# Patient Record
Sex: Male | Born: 1945 | Race: White | Hispanic: No | Marital: Married | Smoking: Former smoker
Health system: Southern US, Community
[De-identification: ages and names within clinical notes are randomized; demographics above are authoritative.]

## PROBLEM LIST (undated history)

## (undated) DIAGNOSIS — M1990 Unspecified osteoarthritis, unspecified site: Secondary | ICD-10-CM

## (undated) DIAGNOSIS — J4489 Other specified chronic obstructive pulmonary disease: Secondary | ICD-10-CM

## (undated) DIAGNOSIS — I499 Cardiac arrhythmia, unspecified: Secondary | ICD-10-CM

## (undated) DIAGNOSIS — E785 Hyperlipidemia, unspecified: Secondary | ICD-10-CM

## (undated) DIAGNOSIS — G4733 Obstructive sleep apnea (adult) (pediatric): Secondary | ICD-10-CM

## (undated) DIAGNOSIS — J310 Chronic rhinitis: Secondary | ICD-10-CM

## (undated) DIAGNOSIS — I2510 Atherosclerotic heart disease of native coronary artery without angina pectoris: Secondary | ICD-10-CM

## (undated) DIAGNOSIS — J449 Chronic obstructive pulmonary disease, unspecified: Secondary | ICD-10-CM

## (undated) DIAGNOSIS — H269 Unspecified cataract: Secondary | ICD-10-CM

## (undated) DIAGNOSIS — I4891 Unspecified atrial fibrillation: Secondary | ICD-10-CM

## (undated) DIAGNOSIS — J479 Bronchiectasis, uncomplicated: Secondary | ICD-10-CM

## (undated) DIAGNOSIS — G4730 Sleep apnea, unspecified: Secondary | ICD-10-CM

## (undated) DIAGNOSIS — N289 Disorder of kidney and ureter, unspecified: Secondary | ICD-10-CM

## (undated) DIAGNOSIS — K8050 Calculus of bile duct without cholangitis or cholecystitis without obstruction: Secondary | ICD-10-CM

## (undated) DIAGNOSIS — K862 Cyst of pancreas: Secondary | ICD-10-CM

## (undated) DIAGNOSIS — I10 Essential (primary) hypertension: Secondary | ICD-10-CM

## (undated) HISTORY — DX: Unspecified atrial fibrillation: I48.91

## (undated) HISTORY — DX: Obstructive sleep apnea (adult) (pediatric): G47.33

## (undated) HISTORY — DX: Cardiac arrhythmia, unspecified: I49.9

## (undated) HISTORY — DX: Unspecified cataract: H26.9

## (undated) HISTORY — DX: Sleep apnea, unspecified: G47.30

## (undated) HISTORY — DX: Chronic rhinitis: J31.0

## (undated) HISTORY — DX: Essential (primary) hypertension: I10

## (undated) HISTORY — DX: Atherosclerotic heart disease of native coronary artery without angina pectoris: I25.10

## (undated) HISTORY — DX: Other specified chronic obstructive pulmonary disease: J44.89

## (undated) HISTORY — DX: Unspecified osteoarthritis, unspecified site: M19.90

## (undated) HISTORY — DX: Chronic obstructive pulmonary disease, unspecified: J44.9

## (undated) HISTORY — DX: Calculus of bile duct without cholangitis or cholecystitis without obstruction: K80.50

## (undated) HISTORY — DX: Hyperlipidemia, unspecified: E78.5

## (undated) HISTORY — DX: Cyst of pancreas: K86.2

---

## 1998-12-19 HISTORY — PX: LITHOTRIPSY: SUR834

## 2004-03-19 LAB — HM COLONOSCOPY

## 2004-03-19 LAB — HM DEXA SCAN

## 2004-04-22 ENCOUNTER — Encounter: Admission: RE | Admit: 2004-04-22 | Discharge: 2004-04-22 | Payer: Self-pay | Admitting: *Deleted

## 2007-04-02 ENCOUNTER — Ambulatory Visit: Payer: Self-pay | Admitting: Gastroenterology

## 2007-04-16 ENCOUNTER — Ambulatory Visit: Payer: Self-pay | Admitting: Gastroenterology

## 2008-11-03 ENCOUNTER — Emergency Department (HOSPITAL_COMMUNITY): Admission: EM | Admit: 2008-11-03 | Discharge: 2008-11-03 | Payer: Self-pay | Admitting: Emergency Medicine

## 2009-12-16 ENCOUNTER — Encounter: Payer: Self-pay | Admitting: Family Medicine

## 2009-12-24 ENCOUNTER — Ambulatory Visit: Payer: Self-pay | Admitting: Sports Medicine

## 2009-12-24 DIAGNOSIS — I10 Essential (primary) hypertension: Secondary | ICD-10-CM | POA: Insufficient documentation

## 2009-12-24 DIAGNOSIS — IMO0002 Reserved for concepts with insufficient information to code with codable children: Secondary | ICD-10-CM | POA: Insufficient documentation

## 2009-12-24 DIAGNOSIS — J45909 Unspecified asthma, uncomplicated: Secondary | ICD-10-CM | POA: Insufficient documentation

## 2009-12-24 DIAGNOSIS — M25569 Pain in unspecified knee: Secondary | ICD-10-CM | POA: Insufficient documentation

## 2009-12-24 DIAGNOSIS — E785 Hyperlipidemia, unspecified: Secondary | ICD-10-CM | POA: Insufficient documentation

## 2009-12-25 ENCOUNTER — Encounter: Payer: Self-pay | Admitting: Sports Medicine

## 2010-01-28 ENCOUNTER — Ambulatory Visit: Payer: Self-pay | Admitting: Sports Medicine

## 2011-01-20 NOTE — Assessment & Plan Note (Signed)
Summary: NP,LEFT KNEE,MC   Vital Signs:  Patient profile:   65 year old male Height:      69 inches Weight:      249 pounds BMI:     36.90 BP sitting:   114 / 79  Vitals Entered By: Lillia Pauls CMA (December 24, 2009 2:33 PM)  History of Present Illness: Pt presents today for evaluation of his Left Knee.  He states that 6 weeks ago he was working in his garage all day and by the end of the day he felt the medial aspect of his knee began to bother him.  Since that time he has had increased pain and difficulty with movement of his knee. He states that it is difficult for his to straighten or to flex his knee.  He describes the pain as a knawing ache that is like a tooth ache.  No shoot pain, no numbness or tingling, no griding, no poping, no locking and no giving out on him.  He did try a OTC knee brace that he felt made it worse and may have bruised him so he stopped using it.  Also for pain he has been taking ibprofen with moderate relief.  He has had 2 other major injuries to this leg in the past including a childhood football injury involving a lateral blow to the knee and a spiral fracture to his ankle from a ski accident.    This patient referred courtesy of Dr Dara Hoyer at [facility]  Allergies (verified): No Known Drug Allergies  Past History:  Past Medical History: Asthma Hyperlipidemia Hypertension Urolithiasis  Past Surgical History: Ltihotripsy  Physical Exam  General:  Well-developed,well-nourished, overweight-appearing,in no acute distress; alert,appropriate and cooperative throughout examination .   Head:  Normocephalic and atraumatic without obvious abnormalities.  Msk:  Left Knee Exam: ROM: Flexion = 90o; extension =+15o Ligaments intact + McMurrary's & + Thessaly  Additional Exam:  Knee [place]: Medial meniscus with hypoecoic areas indicating a tear in the superficial area with fluid dissection along femur; Quadriceps tendons intact; No bursal fluid noted; several  images show peripheral fragmentation of meniscus which is consistent with his exam. Other areas show some calcification of mensicus without fragmentation consistent with chronic degnerative process  images saved    Impression & Recommendations:  Problem # 1:  KNEE PAIN, LEFT (ICD-719.46)  His updated medication list for this problem includes:    Aspirin 325 Mg Tabs (Aspirin)  he feels he can tolerate this with OTC NSAIDS we will fit him with a Irena Cords support for standing and walking He will prob need to use this at least 8 weeks  Orders: US EXTREMITY NON-VASC REAL-TIME IMG (21308) Knee Support Pat cutout [phone])  Problem # 2:  MEDIAL MENISCUS TEAR, LEFT (ICD-836.0)  with clinical exam and US findings correlating so closely this is about 90% diagnostic I explained that degenerative meniscus injury is common in older age group and that my review of his XRAY and other findings was relatively unremarkable I do not think he has any sever DJD  encourage long term quad strength recumbent bike SLR quad sets reck in 1 month  Orders: US EXTREMITY NON-VASC REAL-TIME IMG (69629) Knee Support Pat cutout [phone])  Complete Medication List: 1)  Advair Diskus 100-50 Mcg/dose Aepb (Fluticasone-salmeterol) 2)  Diovan Hct 80-12.5 Mg Tabs (Valsartan-hydrochlorothiazide) 3)  Lovastatin 10 Mg Tabs (Lovastatin) 4)  Aspirin 325 Mg Tabs (Aspirin) 5)  Multivitamins Caps (Multiple vitamin)  Patient Instructions: 1)  It  was nice seeing you today. 2)  Wear the knee sleeve we provided you today for during exercises. 3)  Use a exercise bike making sure the seat height is adequately adjusted. 4)  Complete the Quad Set & Straight-Leg lift exercises as shown in the handout 5)  Use ibprofen or aleve for pain when needed

## 2011-01-20 NOTE — Consult Note (Signed)
Summary: [facility]  [facility]   Imported By: Marily Memos 01/04/2010 16:27:40  _____________________________________________________________________  External Attachment:    Type:   Image     Comment:   External Document

## 2011-01-20 NOTE — Assessment & Plan Note (Signed)
Summary: F/U,MC   Vital Signs:  Patient profile:   65 year old male BP sitting:   121 / 82  Vitals Entered By: Lillia Pauls CMA (January 28, 2010 2:02 PM)  History of Present Illness: Pt presents for follow-up of left knee pain that started in November of 2010 while he was cleaning out his garage. He was diagnosed with a medial meniscus tear on ultrasound at his last visit and given a DonJoy knee brace and multiple exercises to do. He is still not requiring any pain medications. He has not been doing his exercises consistently but is hoping to get a stationary bicycle in the next week to help with knee motion. He denies locking but has minimal edema on occasion. He does limp when he has the intermittent shooting pain. He prefers to wear the brace because it decreases his sensation of instability. He has no desire for surgery at this time.   Allergies: No Known Drug Allergies  Physical Exam  General:  alert and well-developed.   Head:  normocephalic and atraumatic.   Neck:  supple.   Lungs:  normal respiratory effort.   Msk:  Left Knee: No bony abnormalities, edema or bruising Lacks terminal extension (5 degrees) and can only flex to 95 degrees + TTP along the medial joint line Normal MCL and LCL + McMurray's testing Neg ADS and PDS 5/5 strength on resisted knee flexion and extension Able to walk without a limp  Right Knee: No bony abnormalities, edema or bruising Full ROM without pain No TTP throughout Normal MCL and LCL Neg ADS and PDS and McMurray's 5/5 strength with resisted knee flexion and extension Able to walk without a limp    Impression & Recommendations:  Problem # 1:  MEDIAL MENISCUS TEAR, LEFT (ICD-836.0) Assessment Improved About 15% better per the patient 1. Really encouraged him to get on a stationary bike to help with his over all fitness and mobility of his left knee 2. Would wait on an injection until pain/aching at night without improvement with  OTC pain meds 3. Continue strengthening exercises daily 4. Ice as needed for swelling for 20 minutes daily 5. Return in 2 months for follow-up  Problem # 2:  KNEE PAIN, LEFT (ICD-719.46) Assessment: Improved See above for treatment plan  His updated medication list for this problem includes:    Aspirin 325 Mg Tabs (Aspirin)  Complete Medication List: 1)  Advair Diskus 100-50 Mcg/dose Aepb (Fluticasone-salmeterol) 2)  Diovan Hct 80-12.5 Mg Tabs (Valsartan-hydrochlorothiazide) 3)  Lovastatin 10 Mg Tabs (Lovastatin) 4)  Aspirin 325 Mg Tabs (Aspirin) 5)  Multivitamins Caps (Multiple vitamin)

## 2011-01-20 NOTE — Letter (Signed)
Summary: *Consult Note  [facility]  [address]   [place], [place] [address]   Phone: [phone]  Fax: [phone]    Re:    Mario [name]       Jan. 7, 2011 DOB:    Mar 31, 1946 Dr. Dara Hoyer [facility] [place]  Fax [phone]   Dear Onalee Hua:    Thank you for requesting that we see the above patient for consultation.  A copy of the detailed office note will be sent under separate cover, for your review.  Evaluation today is consistent with:  1)  MEDIAL MENISCUS TEAR, LEFT (ICD-836.0) 2)  KNEE PAIN, LEFT (ICD-719.46) 3)  HYPERTENSION (ICD-401.9) 4)  HYPERLIPIDEMIA (ICD-272.4) 5)  ASTHMA (ICD-493.90)  Our recommendation is for: conservative care.  He has some limits in full function that hopefully will resolve with rehab exercises and some supportive bracing.  I think he can probably resove this without surgery and that is his desire as well.  New Orders include:  1)  Consultation Level II [99242] 2)  US EXTREMITY NON-VASC REAL-TIME IMG [02725] 3)  Knee Support Pat cutout [L1825]   New Medications started today include: none; will use OTC NSAIDs   After today's visit, the patients current medications include: 1)  ADVAIR DISKUS 100-50 MCG/DOSE AEPB (FLUTICASONE-SALMETEROL)  2)  DIOVAN HCT 80-12.5 MG TABS (VALSARTAN-HYDROCHLOROTHIAZIDE)  3)  LOVASTATIN 10 MG TABS (LOVASTATIN)  4)  ASPIRIN 325 MG TABS (ASPIRIN)  5)  MULTIVITAMINS  CAPS (MULTIPLE VITAMIN)    Thank you for this consultation.  If you have any further questions regarding the care of this patient, please do not hesitate to contact me @ [phone].  Thank you for this opportunity to look after your patient.  Sincerely,  Vincent Gros MD

## 2011-02-19 ENCOUNTER — Encounter: Payer: Self-pay | Admitting: *Deleted

## 2011-06-10 ENCOUNTER — Encounter: Payer: Self-pay | Admitting: Family Medicine

## 2011-06-10 DIAGNOSIS — J310 Chronic rhinitis: Secondary | ICD-10-CM | POA: Insufficient documentation

## 2011-06-10 DIAGNOSIS — G4733 Obstructive sleep apnea (adult) (pediatric): Secondary | ICD-10-CM | POA: Insufficient documentation

## 2012-03-14 ENCOUNTER — Encounter: Payer: Self-pay | Admitting: Gastroenterology

## 2012-12-11 ENCOUNTER — Observation Stay (HOSPITAL_COMMUNITY)
Admission: EM | Admit: 2012-12-11 | Discharge: 2012-12-12 | Disposition: A | Discharge status: Home / Self Care | Payer: Medicare Other | Attending: Internal Medicine | Admitting: Internal Medicine

## 2012-12-11 ENCOUNTER — Encounter (HOSPITAL_COMMUNITY): Payer: Self-pay | Admitting: *Deleted

## 2012-12-11 ENCOUNTER — Emergency Department (HOSPITAL_COMMUNITY): Payer: Medicare Other

## 2012-12-11 DIAGNOSIS — Z7982 Long term (current) use of aspirin: Secondary | ICD-10-CM | POA: Insufficient documentation

## 2012-12-11 DIAGNOSIS — I10 Essential (primary) hypertension: Secondary | ICD-10-CM | POA: Diagnosis present

## 2012-12-11 DIAGNOSIS — Z79899 Other long term (current) drug therapy: Secondary | ICD-10-CM | POA: Insufficient documentation

## 2012-12-11 DIAGNOSIS — J45909 Unspecified asthma, uncomplicated: Secondary | ICD-10-CM | POA: Insufficient documentation

## 2012-12-11 DIAGNOSIS — K8020 Calculus of gallbladder without cholecystitis without obstruction: Secondary | ICD-10-CM | POA: Insufficient documentation

## 2012-12-11 DIAGNOSIS — R059 Cough, unspecified: Secondary | ICD-10-CM | POA: Insufficient documentation

## 2012-12-11 DIAGNOSIS — R1013 Epigastric pain: Secondary | ICD-10-CM | POA: Diagnosis present

## 2012-12-11 DIAGNOSIS — E785 Hyperlipidemia, unspecified: Secondary | ICD-10-CM | POA: Diagnosis present

## 2012-12-11 DIAGNOSIS — R079 Chest pain, unspecified: Principal | ICD-10-CM | POA: Insufficient documentation

## 2012-12-11 DIAGNOSIS — R05 Cough: Secondary | ICD-10-CM | POA: Insufficient documentation

## 2012-12-11 DIAGNOSIS — G4733 Obstructive sleep apnea (adult) (pediatric): Secondary | ICD-10-CM | POA: Diagnosis present

## 2012-12-11 LAB — TROPONIN I: Troponin I: <0.3 ng/mL (ref <0.30)

## 2012-12-11 LAB — CBC WITH DIFFERENTIAL/PLATELET
Basophils Relative: 0 % (ref 0–1)
HCT: 43.4 % (ref 39.0–52.0)
Hemoglobin: 14.8 g/dL (ref 13.0–17.0)
Lymphocytes Relative: 15 % (ref 12–46)
Lymphs Abs: 1.6 10*3/uL (ref 0.7–4.0)
MCV: 88.9 fL (ref 78.0–100.0)
Monocytes Absolute: 0.6 10*3/uL (ref 0.1–1.0)
Neutrophils Relative %: 77 % (ref 43–77)
Platelets: 250 10*3/uL (ref 150–400)
WBC: 10.4 10*3/uL (ref 4.0–10.5)

## 2012-12-11 LAB — COMPREHENSIVE METABOLIC PANEL
ALT: 27 U/L (ref 0–53)
Albumin: 3.5 g/dL (ref 3.5–5.2)
BUN: 23 mg/dL (ref 6–23)
CO2: 30 mEq/L (ref 19–32)
Calcium: 9.2 mg/dL (ref 8.4–10.5)
Creatinine, Ser: 1.36 mg/dL — ABNORMAL HIGH (ref 0.50–1.35)
GFR calc Af Amer: 61 mL/min — ABNORMAL LOW (ref >90)
Glucose, Bld: 121 mg/dL — ABNORMAL HIGH (ref 70–99)
Sodium: 135 mEq/L (ref 135–145)
Total Bilirubin: 0.5 mg/dL (ref 0.3–1.2)

## 2012-12-11 MED ORDER — NITROGLYCERIN 0.4 MG SL SUBL
0.4000 mg | SUBLINGUAL_TABLET | SUBLINGUAL | Status: DC | PRN
  Administered 2012-12-11 (×2): 0.4 mg via SUBLINGUAL
  Filled 2012-12-11: qty 25

## 2012-12-11 MED ORDER — GI COCKTAIL ~~LOC~~
30.0000 mL | Freq: Once | ORAL | Status: AC
  Administered 2012-12-11: 30 mL via ORAL
  Filled 2012-12-11: qty 30

## 2012-12-11 MED ORDER — ONDANSETRON HCL 4 MG/2ML IJ SOLN
4.0000 mg | Freq: Once | INTRAMUSCULAR | Status: AC
  Administered 2012-12-11: 4 mg via INTRAVENOUS
  Filled 2012-12-11: qty 2

## 2012-12-11 MED ORDER — MORPHINE SULFATE 4 MG/ML IJ SOLN
4.0000 mg | Freq: Once | INTRAMUSCULAR | Status: AC
  Administered 2012-12-11: 4 mg via INTRAVENOUS
  Filled 2012-12-11: qty 1

## 2012-12-11 NOTE — ED Notes (Addendum)
Wife : (emergency contact) [phone] Daughter:  [phone] Please contact wife first and then contact daughter with any change of condition  Password:  [email or web address]

## 2012-12-11 NOTE — ED Notes (Signed)
Pt denies nausea or vomiting,  Chest pressure across under breast started 3 hours ago  Sudden onset,  Denies nausea or vomiting.,  States was watching tv

## 2012-12-11 NOTE — ED Provider Notes (Signed)
History     CSN: [id number]  Arrival date & time 12/11/12  2147   First MD Initiated Contact with Patient 12/11/12 2201      Chief Complaint  Patient presents with  . Chest Pain    (Consider location/radiation/quality/duration/timing/severity/associated sxs/prior treatment) Patient is a 66 y.o. male presenting with chest pain. The history is provided by the patient.  Chest Pain   He had onset at about 6 PM of a dull, achy pain across his upper abdomen without radiation. Pain is moderately severe and he rates at 8/10. He did vomit twice which did not affect the pain. There is no dyspnea or diaphoresis. Pain does vary with position, but he has not found a position that is consistently more comfortable her consistently less comfortable. He's never had similar pain in the past. He does have significant cardiac risk factors hypertension and hyperlipidemia. He is a nonsmoker and denies history of diabetes. He did take 5 baby aspirin home prior to coming in.  Past Medical History  Diagnosis Date  . Asthma   . Hypertension   . Obstructive sleep apnea   . Rhinitis   . Hyperlipidemia     History reviewed. No pertinent past surgical history.  History reviewed. No pertinent family history.  History  Substance Use Topics  . Smoking status: Never Smoker   . Smokeless tobacco: Not on file  . Alcohol Use: Yes      Review of Systems  Cardiovascular: Positive for chest pain.  All other systems reviewed and are negative.    Allergies  Review of patient's allergies indicates no known allergies.  Home Medications   Current Outpatient Rx  Name  Route  Sig  Dispense  Refill  . ASPIRIN 325 MG PO TABS                . ATORVASTATIN CALCIUM 40 MG PO TABS   Oral   Take 40 mg by mouth daily.           Marland Kitchen DICLOFENAC SODIUM 1 % TD GEL   Topical   Apply topically 4 (four) times daily. Rub 1 gram to the affected area          . OMEGA-3 FATTY ACIDS 1000 MG PO CAPS   Oral    Take 2 g by mouth daily.           Marland Kitchen FLAXSEED OIL OIL   Does not apply   by Does not apply route.           Marland Kitchen FLUTICASONE-SALMETEROL 100-50 MCG/DOSE IN AEPB                . LOSARTAN POTASSIUM-HCTZ 100-12.5 MG PO TABS   Oral   Take 1 tablet by mouth daily.           Marland Kitchen LOVASTATIN 10 MG PO TABS                . MONTELUKAST SODIUM 10 MG PO TABS   Oral   Take 10 mg by mouth at bedtime.           . MULTIVITAMINS PO CAPS                . SILDENAFIL CITRATE 100 MG PO TABS   Oral   Take 100 mg by mouth daily as needed.           . TESTOSTERONE 25 MG/2.5GM TD GEL   Transdermal   Place onto the skin.           Marland Kitchen  VALSARTAN-HYDROCHLOROTHIAZIDE 80-12.5 MG PO TABS                  BP 161/75  Pulse 61  Temp 98.7 F (37.1 C) (Oral)  Resp 20  Ht 5\' 9"  (1.753 m)  Wt 245 lb (111.131 kg)  BMI 36.18 kg/m2  SpO2 98%  Physical Exam  Nursing note and vitals reviewed. 66 year old male, resting comfortably and in no acute distress. Vital signs are significant for hypertension with blood pressure 161/75. Oxygen saturation is 98%, which is normal. Head is normocephalic and atraumatic. PERRLA, EOMI. Oropharynx is clear. Neck is nontender and supple without adenopathy or JVD. Back is nontender and there is no CVA tenderness. Lungs are clear without rales, wheezes, or rhonchi. Chest is nontender. Heart has regular rate and rhythm without murmur. Abdomen is soft, flat, with mild to moderate tenderness across the upper abdomen. There is a plus minus Murphy sign. There are no masses or hepatosplenomegaly and peristalsis is hypoactive. Extremities have no cyanosis or edema, full range of motion is present. Skin is warm and dry without rash. Neurologic: Mental status is normal, cranial nerves are intact, there are no motor or sensory deficits.   ED Course  Procedures (including critical care time)  Results for orders placed during the hospital encounter of 12/11/12   CBC WITH DIFFERENTIAL      Component Value Range   WBC 10.4  4.0 - 10.5 K/uL   RBC 4.88  4.22 - 5.81 MIL/uL   Hemoglobin 14.8  13.0 - 17.0 g/dL   HCT 16.1  09.6 - 04.5 %   MCV 88.9  78.0 - 100.0 fL   MCH 30.3  26.0 - 34.0 pg   MCHC 34.1  30.0 - 36.0 g/dL   RDW 40.9  81.1 - 91.4 %   Platelets 250  150 - 400 K/uL   Neutrophils Relative 77  43 - 77 %   Neutro Abs 8.0 (*) 1.7 - 7.7 K/uL   Lymphocytes Relative 15  12 - 46 %   Lymphs Abs 1.6  0.7 - 4.0 K/uL   Monocytes Relative 6  3 - 12 %   Monocytes Absolute 0.6  0.1 - 1.0 K/uL   Eosinophils Relative 1  0 - 5 %   Eosinophils Absolute 0.1  0.0 - 0.7 K/uL   Basophils Relative 0  0 - 1 %   Basophils Absolute 0.0  0.0 - 0.1 K/uL  COMPREHENSIVE METABOLIC PANEL      Component Value Range   Sodium 135  135 - 145 mEq/L   Potassium 3.7  3.5 - 5.1 mEq/L   Chloride 97  96 - 112 mEq/L   CO2 30  19 - 32 mEq/L   Glucose, Bld 121 (*) 70 - 99 mg/dL   BUN 23  6 - 23 mg/dL   Creatinine, Ser 7.82 (*) 0.50 - 1.35 mg/dL   Calcium 9.2  8.4 - 95.6 mg/dL   Total Protein 6.3  6.0 - 8.3 g/dL   Albumin 3.5  3.5 - 5.2 g/dL   AST 24  0 - 37 U/L   ALT 27  0 - 53 U/L   Alkaline Phosphatase 46  39 - 117 U/L   Total Bilirubin 0.5  0.3 - 1.2 mg/dL   GFR calc non Af Amer 52 (*) >90 mL/min   GFR calc Af Amer 61 (*) >90 mL/min  LIPASE, BLOOD      Component Value Range   Lipase 42  11 - 59 U/L  TROPONIN I      Component Value Range   Troponin I <0.30  <0.30 ng/mL   Dg Chest Portable 1 View  12/11/2012  *RADIOLOGY REPORT*  Clinical Data: Chest pain and cough.  History of smoking.  PORTABLE CHEST - 1 VIEW  Comparison: None.  Findings: The lungs are well-aerated.  Mild bibasilar opacities likely reflect atelectasis.  Minimal vascular congestion is noted. There is no evidence of pleural effusion or pneumothorax.  The cardiomediastinal silhouette is borderline normal in size.  No acute osseous abnormalities are seen.  IMPRESSION: Minimal vascular congestion  noted; mild bibasilar opacities likely reflect atelectasis.  Lungs otherwise grossly clear.   Original Report Authenticated By: Tonia Ghent, M.D.       Date: 12/11/2012  Rate: 54  Rhythm: sinus bradycardia and premature atrial contractions (PAC)  QRS Axis: normal  Intervals: normal  ST/T Wave abnormalities: nonspecific T wave changes  Conduction Disutrbances:none  Narrative Interpretation: Sinus bradycardia with PACs, nonspecific T-wave flattening and mild inversion in the lateral leads. No prior ECG available for comparison.  Old EKG Reviewed: none available    1. Chest pain       MDM  Upper abdominal pain which seems more likely to be gastrointestinal the cardiac. However, he will need to have cardiac markers checked. He will be given a trial of a GI cocktail and that fails, he will be given a trial of nitroglycerin.  Limited bedside ultrasound was obtained looking for gallstones. There is a suggestion of sludge being present but no definite stones. There was a plus minus positive sonographic Murphy's sign. Images were saved.  He got no pain relief with GI cocktail. Is also given nitroglycerin with no daily. He was given morphine with good pain relief. Initial workup is negative and normal transaminases and normal troponin. Creatinine is noted to be slightly elevated. Lipase is normal. He was given morphine for pain with good relief. Case is discussed Dr. Conley Rolls of [facility] who agrees to admit the patient.   Dione Booze, MD 12/12/12 [phone]

## 2012-12-12 ENCOUNTER — Observation Stay (HOSPITAL_COMMUNITY): Payer: Medicare Other

## 2012-12-12 ENCOUNTER — Encounter (HOSPITAL_COMMUNITY): Payer: Self-pay | Admitting: Internal Medicine

## 2012-12-12 DIAGNOSIS — G4733 Obstructive sleep apnea (adult) (pediatric): Secondary | ICD-10-CM

## 2012-12-12 DIAGNOSIS — R079 Chest pain, unspecified: Secondary | ICD-10-CM

## 2012-12-12 DIAGNOSIS — R1013 Epigastric pain: Secondary | ICD-10-CM | POA: Diagnosis present

## 2012-12-12 DIAGNOSIS — R6889 Other general symptoms and signs: Secondary | ICD-10-CM

## 2012-12-12 LAB — CBC
HCT: 45.5 % (ref 39.0–52.0)
Platelets: 270 10*3/uL (ref 150–400)
RDW: 13.3 % (ref 11.5–15.5)
WBC: 14.6 10*3/uL — ABNORMAL HIGH (ref 4.0–10.5)

## 2012-12-12 LAB — URINALYSIS, ROUTINE W REFLEX MICROSCOPIC
Bilirubin Urine: NEGATIVE
Glucose, UA: NEGATIVE mg/dL
Hgb urine dipstick: NEGATIVE
Ketones, ur: NEGATIVE mg/dL
pH: 7.5 (ref 5.0–8.0)

## 2012-12-12 LAB — TROPONIN I: Troponin I: <0.3 ng/mL (ref <0.30)

## 2012-12-12 LAB — CREATININE, SERUM
GFR calc Af Amer: 71 mL/min — ABNORMAL LOW (ref >90)
GFR calc non Af Amer: 61 mL/min — ABNORMAL LOW (ref >90)

## 2012-12-12 MED ORDER — MONTELUKAST SODIUM 10 MG PO TABS
10.0000 mg | ORAL_TABLET | Freq: Every day | ORAL | Status: DC
  Filled 2012-12-12: qty 1

## 2012-12-12 MED ORDER — PANTOPRAZOLE SODIUM 40 MG PO TBEC: 40.0000 mg | DELAYED_RELEASE_TABLET | Freq: Every day | ORAL | Status: DC

## 2012-12-12 MED ORDER — ATORVASTATIN CALCIUM 40 MG PO TABS
40.0000 mg | ORAL_TABLET | Freq: Every day | ORAL | Status: DC
  Filled 2012-12-12: qty 1

## 2012-12-12 MED ORDER — ONDANSETRON HCL 4 MG/2ML IJ SOLN: 4.0000 mg | Freq: Four times a day (QID) | INTRAMUSCULAR | Status: DC | PRN

## 2012-12-12 MED ORDER — ENOXAPARIN SODIUM 60 MG/0.6ML ~~LOC~~ SOLN
60.0000 mg | SUBCUTANEOUS | Status: DC
  Administered 2012-12-12: 60 mg via SUBCUTANEOUS
  Filled 2012-12-12: qty 0.6

## 2012-12-12 MED ORDER — ONDANSETRON HCL 4 MG PO TABS: 4.0000 mg | ORAL_TABLET | Freq: Four times a day (QID) | ORAL | Status: DC | PRN

## 2012-12-12 MED ORDER — SODIUM CHLORIDE 0.9 % IJ SOLN
3.0000 mL | Freq: Two times a day (BID) | INTRAMUSCULAR | Status: DC
  Administered 2012-12-12: 3 mL via INTRAVENOUS

## 2012-12-12 MED ORDER — LOSARTAN POTASSIUM 50 MG PO TABS
100.0000 mg | ORAL_TABLET | Freq: Every day | ORAL | Status: DC
  Administered 2012-12-12: 100 mg via ORAL
  Filled 2012-12-12: qty 2

## 2012-12-12 MED ORDER — MOMETASONE FURO-FORMOTEROL FUM 100-5 MCG/ACT IN AERO
2.0000 | INHALATION_SPRAY | Freq: Two times a day (BID) | RESPIRATORY_TRACT | Status: DC
  Administered 2012-12-12: 2 via RESPIRATORY_TRACT
  Filled 2012-12-12: qty 8.8

## 2012-12-12 MED ORDER — OMEGA-3 FATTY ACIDS 1000 MG PO CAPS: 2.0000 g | ORAL_CAPSULE | Freq: Every day | ORAL | Status: DC

## 2012-12-12 MED ORDER — MORPHINE SULFATE 4 MG/ML IJ SOLN
4.0000 mg | Freq: Once | INTRAMUSCULAR | Status: AC
  Administered 2012-12-12: 4 mg via INTRAVENOUS
  Filled 2012-12-12: qty 1

## 2012-12-12 MED ORDER — HYDROMORPHONE HCL PF 1 MG/ML IJ SOLN: 1.0000 mg | INTRAMUSCULAR | Status: DC | PRN

## 2012-12-12 MED ORDER — OMEGA-3-ACID ETHYL ESTERS 1 G PO CAPS
1.0000 g | ORAL_CAPSULE | Freq: Every day | ORAL | Status: DC
  Administered 2012-12-12: 1 g via ORAL
  Filled 2012-12-12: qty 1

## 2012-12-12 MED ORDER — DOCUSATE SODIUM 100 MG PO CAPS
100.0000 mg | ORAL_CAPSULE | Freq: Two times a day (BID) | ORAL | Status: DC
  Administered 2012-12-12: 100 mg via ORAL
  Filled 2012-12-12 (×2): qty 1

## 2012-12-12 MED ORDER — ASPIRIN 325 MG PO TABS
325.0000 mg | ORAL_TABLET | Freq: Every day | ORAL | Status: DC
  Administered 2012-12-12: 325 mg via ORAL
  Filled 2012-12-12: qty 1

## 2012-12-12 MED ORDER — HYDROCHLOROTHIAZIDE 12.5 MG PO CAPS
12.5000 mg | ORAL_CAPSULE | Freq: Every day | ORAL | Status: DC
  Administered 2012-12-12: 12.5 mg via ORAL
  Filled 2012-12-12: qty 1

## 2012-12-12 MED ORDER — ENOXAPARIN SODIUM 40 MG/0.4ML ~~LOC~~ SOLN: 40.0000 mg | SUBCUTANEOUS | Status: DC

## 2012-12-12 MED ORDER — DEXTROSE-NACL 5-0.9 % IV SOLN
INTRAVENOUS | Status: DC
  Administered 2012-12-12: 03:00:00 via INTRAVENOUS

## 2012-12-12 MED ORDER — PANTOPRAZOLE SODIUM 40 MG PO TBEC
40.0000 mg | DELAYED_RELEASE_TABLET | Freq: Every day | ORAL | Status: DC
  Administered 2012-12-12: 40 mg via ORAL
  Filled 2012-12-12: qty 1

## 2012-12-12 MED ORDER — LOSARTAN POTASSIUM-HCTZ 100-12.5 MG PO TABS: 1.0000 | ORAL_TABLET | Freq: Every day | ORAL | Status: DC

## 2012-12-12 NOTE — Progress Notes (Signed)
[facility] PROGRESS NOTE  DUC CROCKET ZOX:[id number] DOB: 07-12-1946 DOA: 12/11/2012 PCP: Leo Grosser, MD  Assessment/Plan: 1. Epigastric pain: possibly from gastritis. - US of the abdomen pending.  - on protonix. Daily.  - ACS ruled out. Cardiac enzymes negative. ekg yesterday does not show any ischemic changes. Will repeat one today.  - start clear liquid diet and advance diet as tolerated.   2. Hypertension: controlled.   3. Sleep apnea; on cpap.  4. DVT prophylaxis.    Code Status: full code. Family Communication: none at bedside. Disposition Plan: possiblt today or tomorrow     HPI/Subjective: Comfortable , wants to eat.   Objective: Filed Vitals:   12/12/12 0130 12/12/12 0200 12/12/12 0255 12/12/12 0524  BP: 143/72 159/69 161/78 162/88  Pulse: 60 64 84 85  Temp:   98.2 F (36.8 C) 98.3 F (36.8 C)  TempSrc:   Oral Oral  Resp:   18 18  Height:      Weight:      SpO2: 100% 99% 96% 97%   No intake or output data in the 24 hours ending 12/12/12 0858 Filed Weights   12/11/12 2143  Weight: 111.131 kg (245 lb)    Exam:   General:  Alert afebrile comfortable  Cardiovascular: s1s2  Respiratory: CTAB  Abdomen: soft Mildly tender in the RUQ ND BS+  Data Reviewed: Basic Metabolic Panel:  Lab 12/12/12 8119 12/11/12 2215  NA -- 135  K -- 3.7  CL -- 97  CO2 -- 30  GLUCOSE -- 121*  BUN -- 23  CREATININE 1.19 1.36*  CALCIUM -- 9.2  MG -- --  PHOS -- --   Liver Function Tests:  Lab 12/11/12 2215  AST 24  ALT 27  ALKPHOS 46  BILITOT 0.5  PROT 6.3  ALBUMIN 3.5    Lab 12/11/12 2215  LIPASE 42  AMYLASE --   No results found for this basename: AMMONIA:5 in the last 168 hours CBC:  Lab 12/12/12 0330 12/11/12 2215  WBC 14.6* 10.4  NEUTROABS -- 8.0*  HGB 15.1 14.8  HCT 45.5 43.4  MCV 88.9 88.9  PLT 270 250   Cardiac Enzymes:  Lab 12/12/12 0330 12/11/12 2215  CKTOTAL -- --  CKMB -- --  CKMBINDEX -- --  TROPONINI  <0.30 <0.30   BNP (last 3 results) No results found for this basename: PROBNP:3 in the last 8760 hours CBG: No results found for this basename: GLUCAP:5 in the last 168 hours  No results found for this or any previous visit (from the past 240 hour(s)).   Studies: Dg Chest Portable 1 View  12/11/2012  *RADIOLOGY REPORT*  Clinical Data: Chest pain and cough.  History of smoking.  PORTABLE CHEST - 1 VIEW  Comparison: None.  Findings: The lungs are well-aerated.  Mild bibasilar opacities likely reflect atelectasis.  Minimal vascular congestion is noted. There is no evidence of pleural effusion or pneumothorax.  The cardiomediastinal silhouette is borderline normal in size.  No acute osseous abnormalities are seen.  IMPRESSION: Minimal vascular congestion noted; mild bibasilar opacities likely reflect atelectasis.  Lungs otherwise grossly clear.   Original Report Authenticated By: Tonia Ghent, M.D.     Scheduled Meds:   . aspirin  325 mg Oral Daily  . atorvastatin  40 mg Oral Daily  . docusate sodium  100 mg Oral BID  . enoxaparin (LOVENOX) injection  60 mg Subcutaneous Q24H  . losartan  100 mg Oral Daily   And  .  hydrochlorothiazide  12.5 mg Oral Daily  . mometasone-formoterol  2 puff Inhalation BID  . montelukast  10 mg Oral QHS  . omega-3 acid ethyl esters  1 g Oral Daily  . pantoprazole  40 mg Oral Q0600  . sodium chloride  3 mL Intravenous Q12H   Continuous Infusions:   . dextrose 5 % and 0.9% NaCl 75 mL/hr at 12/12/12 0315    Principal Problem:  *Epigastric abdominal pain Active Problems:  HYPERLIPIDEMIA  HYPERTENSION  Obstructive sleep apnea       [facility]  [facility] Pager [phone]. If 8PM-8AM, please contact night-coverage at [email or web address], password [facility] 12/12/2012, 8:58 AM  LOS: 1 day

## 2012-12-12 NOTE — H&P (Signed)
[facility] History and Physical  Mario [name] XBM:[id number] DOB: Jul 16, 1946    PCP:   Leo Grosser, MD   Chief Complaint: abdominal/ low chest pain.  HPI: Mario [name] is an 66 y.o. male with hx of sleep apnea, HTN, Hyperlipidemia, asthma, presents to the ER with low chest and epigastric discomfort today.  He had this symptoms on and off before, and maybe from eating.  He had some nausea, but no vomiting, and no diaphoresis.  He denied any black or bloody stool, and has no diarrhea.  He had no exertional CP.  Evaluation in the ER included an EKG which showed no acute ST-T changes, a normal set of LFTs, Cr of 1.36, normal WBC and Hb.  He also has normal lipase.  His CXR showed mild bilateral atelectasis.  Hospitalist was asked to admit him for cardiac r/out.  Rewiew of Systems:  Constitutional: Negative for malaise, fever and chills. No significant weight loss or weight gain Eyes: Negative for eye pain, redness and discharge, diplopia, visual changes, or flashes of light. ENMT: Negative for ear pain, hoarseness, nasal congestion, sinus pressure and sore throat. No headaches; tinnitus, drooling, or problem swallowing. Cardiovascular: Negative for chest pain, palpitations, diaphoresis, dyspnea and peripheral edema. ; No orthopnea, PND Respiratory: Negative for cough, hemoptysis, wheezing and stridor. No pleuritic chestpain. Gastrointestinal: Negative for nausea, vomiting, diarrhea, constipation, melena, blood in stool, hematemesis, jaundice and rectal bleeding.    Genitourinary: Negative for frequency, dysuria, incontinence,flank pain and hematuria; Musculoskeletal: Negative for back pain and neck pain. Negative for swelling and trauma.;  Skin: . Negative for pruritus, rash, abrasions, bruising and skin lesion.; ulcerations Neuro: Negative for headache, lightheadedness and neck stiffness. Negative for weakness, altered level of consciousness , altered mental status, extremity  weakness, burning feet, involuntary movement, seizure and syncope.  Psych: negative for anxiety, depression, insomnia, tearfulness, panic attacks, hallucinations, paranoia, suicidal or homicidal ideation    Past Medical History  Diagnosis Date  . Asthma   . Hypertension   . Obstructive sleep apnea   . Rhinitis   . Hyperlipidemia     History reviewed. No pertinent past surgical history.  Medications:  HOME MEDS: Prior to Admission medications   Medication Sig Start Date End Date Taking? Authorizing Provider  aspirin 325 MG tablet Take 325 mg by mouth daily.    Yes Historical Provider, MD  atorvastatin (LIPITOR) 40 MG tablet Take 40 mg by mouth daily.     Yes Historical Provider, MD  fish oil-omega-3 fatty acids 1000 MG capsule Take 2 g by mouth daily.     Yes Historical Provider, MD  Fluticasone-Salmeterol (ADVAIR DISKUS) 100-50 MCG/DOSE AEPB 1 puff daily.    Yes Historical Provider, MD  losartan-hydrochlorothiazide (HYZAAR) 100-12.5 MG per tablet Take 1 tablet by mouth daily.     Yes Historical Provider, MD  lovastatin (MEVACOR) 10 MG tablet 10 mg daily.    Yes Historical Provider, MD  montelukast (SINGULAIR) 10 MG tablet Take 10 mg by mouth at bedtime.     Yes Historical Provider, MD  Multiple Vitamin (MULTIVITAMIN) capsule     Yes Historical Provider, MD     Allergies:  No Known Allergies  Social History:   reports that he has never smoked. He does not have any smokeless tobacco history on file. He reports that he drinks alcohol. He reports that he does not use illicit drugs.  Family History: History reviewed. No pertinent family history.   Physical Exam: Filed Vitals:   12/11/12  2143 12/12/12 0130 12/12/12 0200  BP: 161/75 143/72 159/69  Pulse: 61 60 64  Temp: 98.7 F (37.1 C)    TempSrc: Oral    Resp: 20    Height: 5\' 9"  (1.753 m)    Weight: 111.131 kg (245 lb)    SpO2: 98% 100% 99%   Blood pressure 159/69, pulse 64, temperature 98.7 F (37.1 C), temperature  source Oral, resp. rate 20, height 5\' 9"  (1.753 m), weight 111.131 kg (245 lb), SpO2 99.00%.  GEN:  Pleasant patient lying in the stretcher in no acute distress; cooperative with exam. PSYCH:  alert and oriented x4; does not appear anxious or depressed; affect is appropriate. HEENT: Mucous membranes pink and anicteric; PERRLA; EOM intact; no cervical lymphadenopathy nor thyromegaly or carotid bruit; no JVD; There were no stridor. Neck is very supple. Breasts:: Not examined CHEST WALL: No tenderness CHEST: Normal respiration, clear to auscultation bilaterally.  HEART: Regular rate and rhythm.  There are no murmur, rub, or gallops.   BACK: No kyphosis or scoliosis; no CVA tenderness ABDOMEN: soft and non-tender; no masses, no organomegaly, normal abdominal bowel sounds; no pannus; no intertriginous candida. There is no rebound and no distention. Rectal Exam: Not done EXTREMITIES: No bone or joint deformity; age-appropriate arthropathy of the hands and knees; no edema; no ulcerations.  There is no calf tenderness. Genitalia: not examined PULSES: 2+ and symmetric SKIN: Normal hydration no rash or ulceration CNS: Cranial nerves 2-12 grossly intact no focal lateralizing neurologic deficit.  Speech is fluent; uvula elevated with phonation, facial symmetry and tongue midline. DTR are normal bilaterally, cerebella exam is intact, barbinski is negative and strengths are equaled bilaterally.  No sensory loss.   Labs on Admission:  Basic Metabolic Panel:  Lab 12/11/12 4098  NA 135  K 3.7  CL 97  CO2 30  GLUCOSE 121*  BUN 23  CREATININE 1.36*  CALCIUM 9.2  MG --  PHOS --   Liver Function Tests:  Lab 12/11/12 2215  AST 24  ALT 27  ALKPHOS 46  BILITOT 0.5  PROT 6.3  ALBUMIN 3.5    Lab 12/11/12 2215  LIPASE 42  AMYLASE --   No results found for this basename: AMMONIA:5 in the last 168 hours CBC:  Lab 12/11/12 2215  WBC 10.4  NEUTROABS 8.0*  HGB 14.8  HCT 43.4  MCV 88.9  PLT  250   Cardiac Enzymes:  Lab 12/11/12 2215  CKTOTAL --  CKMB --  CKMBINDEX --  TROPONINI <0.30    CBG: No results found for this basename: GLUCAP:5 in the last 168 hours   Radiological Exams on Admission: Dg Chest Portable 1 View  12/11/2012  *RADIOLOGY REPORT*  Clinical Data: Chest pain and cough.  History of smoking.  PORTABLE CHEST - 1 VIEW  Comparison: None.  Findings: The lungs are well-aerated.  Mild bibasilar opacities likely reflect atelectasis.  Minimal vascular congestion is noted. There is no evidence of pleural effusion or pneumothorax.  The cardiomediastinal silhouette is borderline normal in size.  No acute osseous abnormalities are seen.  IMPRESSION: Minimal vascular congestion noted; mild bibasilar opacities likely reflect atelectasis.  Lungs otherwise grossly clear.   Original Report Authenticated By: Tonia Ghent, M.D.     Assessment/Plan Present on Admission:  . Epigastric abdominal pain . HYPERTENSION . Obstructive sleep apnea . HYPERLIPIDEMIA  PLAN:  Will admit him for cardiac r/out although his discomfort is more epigastric than CP.  For his abdominal pain, his LFTs were normal along  with his lipase, though he does drink some wine daily.  Will obtain first an abdominal US, but I suspect maybe he would benefit getting a HIDA scan.  He will admitted to telemetry under [facility] service.  He is stable, and his meds have been continued.  I have added PPI to his regimen.  Other plans as per orders.  Code Status: FULL Unk Lightning, MD. [facility] Pager [phone] 7pm to 7am.  12/12/2012, 3:32 AM

## 2012-12-12 NOTE — Progress Notes (Signed)
Rx:  Brief Lovenox note Pt w/ wt=111 kg, CrCl~65 ml/min ordered Lovenox for DVT prophylaxis. Adjusted Lovenox for BMI >30 to 60 mg daily. Lorenza Evangelist 12/12/2012 3:16 AM

## 2012-12-12 NOTE — Progress Notes (Signed)
Patient tolerated clear liquid diet well.  States no abdominal discomfort after breakfast.  Advanced diet to full liquids, will continue to monitor.

## 2012-12-19 DIAGNOSIS — K8050 Calculus of bile duct without cholangitis or cholecystitis without obstruction: Secondary | ICD-10-CM

## 2012-12-19 HISTORY — DX: Calculus of bile duct without cholangitis or cholecystitis without obstruction: K80.50

## 2012-12-27 NOTE — Discharge Summary (Signed)
Physician Discharge Summary  Mario [name] HQI:[id number] DOB: 22-Jan-1946 DOA: 12/11/2012  PCP: Leo Grosser, MD  Admit date: 12/11/2012 Discharge date: 12/27/2012    Recommendations for Outpatient Follow-up:  Follow up with PCP AS RECOMMENDED.   Discharge Diagnoses:  Principal Problem:  *Epigastric abdominal pain Active Problems:  HYPERLIPIDEMIA  HYPERTENSION  Obstructive sleep apnea   Discharge Condition: stable  Diet recommendation: low salt diet  Filed Weights   12/11/12 2143  Weight: 111.131 kg (245 lb)    History of present illness:  Mario [name] is an 67 y.o. male with hx of sleep apnea, HTN, Hyperlipidemia, asthma, presents to the ER with low chest and epigastric discomfort today. He had this symptoms on and off before, and maybe from eating. He had some nausea, but no vomiting, and no diaphoresis. He denied any black or bloody stool, and has no diarrhea. He had no exertional CP. Evaluation in the ER included an EKG which showed no acute ST-T changes, a normal set of LFTs, Cr of 1.36, normal WBC and Hb. He also has normal lipase. His CXR showed mild bilateral atelectasis. Hospitalist was asked to admit him for cardiac r/out.   Hospital Course:   Epigastric pain: possibly from gastritis. ACS ruled out. Cardiac enzymes negative. ekg yesterday does not show any ischemic changes.  - US of the abdomen done showed Small gallstones and gallbladder sludge in the gallbladder. No  pain over the gallbladder and Question of mild fatty infiltration of the liver.  He was initially started on clear liquid diet and his diet was advanced as tolerated. Spoke to the patient about getting a surgery consultation to see if he needs a cholecystectomy, patient wanted to defer the consultation and reported that he would follow up with his PCP as outpatient.   2. Hypertension: controlled.  3. Sleep apnea; on cpap.   Procedures:  none  Consultations:  none  Discharge  Exam: Filed Vitals:   12/12/12 0200 12/12/12 0255 12/12/12 0524 12/12/12 0920  BP: 159/69 161/78 162/88 125/75  Pulse: 64 84 85 87  Temp:  98.2 F (36.8 C) 98.3 F (36.8 C)   TempSrc:  Oral Oral   Resp:  18 18   Height:      Weight:      SpO2: 99% 96% 97%     General: Alert afebrile comfortable  Cardiovascular: s1s2  Respiratory: CTAB  Abdomen: soft Mildly tender in the RUQ ND BS+   Discharge Instructions  Discharge Orders    Future Orders Please Complete By Expires   Diet - low sodium heart healthy      Discharge instructions      Comments:   Follow up with PCP and possible referral to surgery as outpatient for possible elective cholecystectomy, if pain reoccurs   Activity as tolerated - No restrictions          Medication List     As of 12/27/2012  8:38 AM    TAKE these medications         ADVAIR DISKUS 100-50 MCG/DOSE Aepb   Generic drug: Fluticasone-Salmeterol   1 puff daily.      aspirin 325 MG tablet   Take 325 mg by mouth daily.      atorvastatin 40 MG tablet   Commonly known as: LIPITOR   Take 40 mg by mouth daily.      fish oil-omega-3 fatty acids 1000 MG capsule   Take 2 g by mouth daily.  losartan-hydrochlorothiazide 100-12.5 MG per tablet   Commonly known as: HYZAAR   Take 1 tablet by mouth daily.      lovastatin 10 MG tablet   Commonly known as: MEVACOR   10 mg daily.      montelukast 10 MG tablet   Commonly known as: SINGULAIR   Take 10 mg by mouth at bedtime.      multivitamin capsule      pantoprazole 40 MG tablet   Commonly known as: PROTONIX   Take 1 tablet (40 mg total) by mouth daily at 6 (six) AM.          The results of significant diagnostics from this hospitalization (including imaging, microbiology, ancillary and laboratory) are listed below for reference.    Significant Diagnostic Studies: US Abdomen Complete  12/12/2012  *RADIOLOGY REPORT*  Clinical Data:  Epigastric pain  COMPLETE ABDOMINAL ULTRASOUND   Comparison:  Ultrasound of the kidneys of 04/22/2004.  Findings:  Gallbladder:  Small gallstones and a small amount of sludge layering in the gallbladder.  No pain is present over gallbladder with compression.  Common bile duct:  The common bile duct is normal measuring 5.3 mm in diameter.  Liver:  The liver appears slightly echogenic which may indicate fatty infiltration.  Correlation with liver function tests is recommended.  IVC:  Appears normal.  Pancreas:  No focal abnormality seen.  Spleen:  The spleen is normal measuring 9.1 cm sagittally.  Right Kidney:  No hydronephrosis is seen.  The right kidney measures 11.27  Left Kidney:  No hydronephrosis is noted.  Abdominal aorta:  No aneurysm identified.  IMPRESSION:  1.  Small gallstones and gallbladder sludge in the gallbladder. No pain over the gallbladder. 2.  Question of mild fatty infiltration of the liver.  Correlate with LFTs.   Original Report Authenticated By: Dwyane Dee, M.D.    Dg Chest Portable 1 View  12/11/2012  *RADIOLOGY REPORT*  Clinical Data: Chest pain and cough.  History of smoking.  PORTABLE CHEST - 1 VIEW  Comparison: None.  Findings: The lungs are well-aerated.  Mild bibasilar opacities likely reflect atelectasis.  Minimal vascular congestion is noted. There is no evidence of pleural effusion or pneumothorax.  The cardiomediastinal silhouette is borderline normal in size.  No acute osseous abnormalities are seen.  IMPRESSION: Minimal vascular congestion noted; mild bibasilar opacities likely reflect atelectasis.  Lungs otherwise grossly clear.   Original Report Authenticated By: Tonia Ghent, M.D.     Microbiology: No results found for this or any previous visit (from the past 240 hour(s)).   Labs: Basic Metabolic Panel: No results found for this basename: NA:5,K:5,CL:5,CO2:5,GLUCOSE:5,BUN:5,CREATININE:5,CALCIUM:5,MG:5,PHOS:5 in the last 168 hours Liver Function Tests: No results found for this basename:  AST:5,ALT:5,ALKPHOS:5,BILITOT:5,PROT:5,ALBUMIN:5 in the last 168 hours No results found for this basename: LIPASE:5,AMYLASE:5 in the last 168 hours No results found for this basename: AMMONIA:5 in the last 168 hours CBC: No results found for this basename: WBC:5,NEUTROABS:5,HGB:5,HCT:5,MCV:5,PLT:5 in the last 168 hours Cardiac Enzymes: No results found for this basename: CKTOTAL:5,CKMB:5,CKMBINDEX:5,TROPONINI:5 in the last 168 hours BNP: BNP (last 3 results) No results found for this basename: PROBNP:3 in the last 8760 hours CBG: No results found for this basename: GLUCAP:5 in the last 168 hours     Signed:  Ladell Lea  [facility] 12/27/2012, 8:38 AM

## 2013-01-08 ENCOUNTER — Encounter (INDEPENDENT_AMBULATORY_CARE_PROVIDER_SITE_OTHER): Payer: Self-pay | Admitting: General Surgery

## 2013-01-08 ENCOUNTER — Ambulatory Visit (INDEPENDENT_AMBULATORY_CARE_PROVIDER_SITE_OTHER): Payer: Medicare Other | Admitting: General Surgery

## 2013-01-08 VITALS — BP 140/84 | HR 70 | Temp 97.9°F | Resp 16 | Ht 69.0 in | Wt 240.4 lb

## 2013-01-08 DIAGNOSIS — K8020 Calculus of gallbladder without cholecystitis without obstruction: Secondary | ICD-10-CM

## 2013-01-08 NOTE — Progress Notes (Signed)
Patient ID: Mario [name], male   DOB: [birth year], 67 y.o.   MRN: [id number]  Chief Complaint  Patient presents with  . Biliary Dyskinesia    Biliary Colic    HPI Mario [name] is a 67 y.o. male.  This patient is referred by Dr. Tanya Nones for evaluation of symptomatic cholelithiasis. He was in the emergency room on Christmas Eve after having a gallbladder attack. He had a 3-4 hours of epigastric abdominal pain which he says felt like his kidney stones but was continuous. He denied any radiation of the pain. The symptoms occurred after eating chicken wings and was relieved with pain medications after 34 hours in the emergency room. Since then he has not had any further episodes but has been maintaining a low fat diet. He denies any other symptoms as well such as fevers or chills or nausea or vomiting. He says his bowels are normal he denies any blood in the stools or black stools. HPI  Past Medical History  Diagnosis Date  . Asthma   . Hypertension   . Obstructive sleep apnea   . Rhinitis   . Hyperlipidemia   . Biliary colic 2014    No past surgical history on file.  No family history on file.  Social History History  Substance Use Topics  . Smoking status: Former Smoker    Start date: 01/08/1975  . Smokeless tobacco: Not on file  . Alcohol Use: 0.5 - 1.0 oz/week    1-2 drink(s) per week    No Known Allergies  Current Outpatient Prescriptions  Medication Sig Dispense Refill  . aspirin 325 MG tablet Take 325 mg by mouth daily.       Marland Kitchen atorvastatin (LIPITOR) 40 MG tablet Take 40 mg by mouth daily.        . Cholecalciferol (VITAMIN D PO) Take by mouth.      . FIBER PO Take by mouth.      . fish oil-omega-3 fatty acids 1000 MG capsule Take 2 g by mouth daily.        . Fluticasone-Salmeterol (ADVAIR DISKUS) 100-50 MCG/DOSE AEPB 1 puff daily.       Marland Kitchen losartan-hydrochlorothiazide (HYZAAR) 100-12.5 MG per tablet Take 1 tablet by mouth daily.        Marland Kitchen lovastatin (MEVACOR) 10 MG  tablet 10 mg daily.       . montelukast (SINGULAIR) 10 MG tablet Take 10 mg by mouth at bedtime.        . Multiple Minerals-Vitamins (CALCIUM CITRATE +) TABS Take by mouth.      . Multiple Vitamin (MULTIVITAMIN) capsule        . Multiple Vitamins-Minerals (ZINC PO) Take by mouth.      . pantoprazole (PROTONIX) 40 MG tablet Take 1 tablet (40 mg total) by mouth daily at 6 (six) AM.  30 tablet  0    Review of Systems Review of Systems All other review of systems negative or noncontributory except as stated in the HPI  Blood pressure 140/84, pulse 70, temperature 97.9 F (36.6 C), temperature source Temporal, resp. rate 16, height 5\' 9"  (1.753 m), weight 240 lb 6.4 oz (109.045 kg).  Physical Exam Physical Exam Physical Exam  Vitals reviewed. Constitutional: He is oriented to person, place, and time. He appears well-developed and well-nourished. No distress.  HENT:  Head: Normocephalic and atraumatic.  Mouth/Throat: No oropharyngeal exudate.  Eyes: Conjunctivae and EOM are normal. Pupils are equal, round, and reactive to light. Right eye exhibits  no discharge. Left eye exhibits no discharge. No scleral icterus.  Neck: Normal range of motion. No tracheal deviation present.  Cardiovascular: Normal rate, regular rhythm and normal heart sounds.   Pulmonary/Chest: Effort normal and breath sounds normal. No stridor. No respiratory distress. He has no wheezes. He has no rales. He exhibits no tenderness.  Abdominal: Soft. Bowel sounds are normal. He exhibits no distension and no mass. There is no tenderness. There is no rebound and no guarding.  Musculoskeletal: Normal range of motion. He exhibits no edema and no tenderness.  Neurological: He is alert and oriented to person, place, and time.  Skin: Skin is warm and dry. No rash noted. He is not diaphoretic. No erythema. No pallor.  Psychiatric: He has a normal mood and affect. His behavior is normal. Judgment and thought content normal.    Data  Reviewed ER records, [place], labs  Assessment    Symptomatic cholelithiasis I think that his symptoms were most likely due to symptomatic cholelithiasis. I have reviewed his ultrasound and laboratory studies in the emergency room records and I agreed at that this was likely due to gallstones. I discussed with him the options for continued observation versus cholecystectomy and have recommended cholecystectomy.  We discussed the pros and cons and risks of the procedure and he would like to wait and avoid surgery for now. I explained the risk of waiting such as continued discomfort, cholecystitis, cholangitis, and peritonitis and the need for emergent or urgent surgery and he expressed understanding. He will continue a low-fat diet and he will call me back in a few months to schedule elective surgery or sooner if he has continued abdominal pain    Plan    Cholecystectomy when desired by the patient.       Lodema Pilot DAVID 01/08/2013, 12:53 PM

## 2013-01-09 ENCOUNTER — Ambulatory Visit (INDEPENDENT_AMBULATORY_CARE_PROVIDER_SITE_OTHER): Payer: Medicare Other | Admitting: General Surgery

## 2013-01-11 ENCOUNTER — Encounter: Payer: Self-pay | Admitting: Gastroenterology

## 2013-04-22 ENCOUNTER — Telehealth: Payer: Self-pay | Admitting: Family Medicine

## 2013-04-22 MED ORDER — FLUTICASONE PROPIONATE 50 MCG/ACT NA SUSP: 2.0000 | Freq: Every day | NASAL | Status: DC

## 2013-04-22 NOTE — Telephone Encounter (Signed)
Rx Refilled  

## 2013-05-31 ENCOUNTER — Encounter: Payer: Self-pay | Admitting: Family Medicine

## 2013-05-31 ENCOUNTER — Ambulatory Visit (INDEPENDENT_AMBULATORY_CARE_PROVIDER_SITE_OTHER): Payer: Medicare Other | Admitting: Family Medicine

## 2013-05-31 VITALS — BP 128/68 | HR 80 | Temp 97.7°F | Resp 18 | Wt 237.0 lb

## 2013-05-31 DIAGNOSIS — J45909 Unspecified asthma, uncomplicated: Secondary | ICD-10-CM

## 2013-05-31 DIAGNOSIS — I10 Essential (primary) hypertension: Secondary | ICD-10-CM | POA: Insufficient documentation

## 2013-05-31 DIAGNOSIS — J45901 Unspecified asthma with (acute) exacerbation: Secondary | ICD-10-CM

## 2013-05-31 DIAGNOSIS — E785 Hyperlipidemia, unspecified: Secondary | ICD-10-CM | POA: Insufficient documentation

## 2013-05-31 DIAGNOSIS — J4489 Other specified chronic obstructive pulmonary disease: Secondary | ICD-10-CM | POA: Insufficient documentation

## 2013-05-31 MED ORDER — PREDNISONE 20 MG PO TABS: ORAL_TABLET | ORAL | Status: DC

## 2013-05-31 MED ORDER — ALBUTEROL SULFATE HFA 108 (90 BASE) MCG/ACT IN AERS: 2.0000 | INHALATION_SPRAY | Freq: Four times a day (QID) | RESPIRATORY_TRACT | Status: DC | PRN

## 2013-05-31 MED ORDER — FLUTICASONE-SALMETEROL 250-50 MCG/DOSE IN AEPB: 1.0000 | INHALATION_SPRAY | Freq: Two times a day (BID) | RESPIRATORY_TRACT | Status: DC

## 2013-05-31 NOTE — Progress Notes (Signed)
Subjective:    Patient ID: Mario [name], male    DOB: 17-Mar-1946, 67 y.o.   MRN: [id number]  HPI Patient has a history of asthma. He was previously well controlled on Advair 100/50 one puff inhaled twice a day. He been doing so well for so long he decreased that to one inhalation in the morning.  Over the last month he is noticed progressively worsening shortness of breath and dyspnea on exertion. He is now wheezing with mildly labored breathing.  He denies any fevers chills sore throat or rhinorrhea. Past Medical History  Diagnosis Date  . Obstructive sleep apnea   . Rhinitis   . Biliary colic 2014  . Asthma   . Hyperlipidemia   . Hypertension     Current Outpatient Prescriptions on File Prior to Visit  Medication Sig Dispense Refill  . aspirin 325 MG tablet Take 325 mg by mouth daily.       Marland Kitchen atorvastatin (LIPITOR) 40 MG tablet Take 40 mg by mouth daily.        . Cholecalciferol (VITAMIN D PO) Take by mouth.      . FIBER PO Take by mouth.      . fish oil-omega-3 fatty acids 1000 MG capsule Take 2 g by mouth daily.        . fluticasone (FLONASE) 50 MCG/ACT nasal spray Place 2 sprays into the nose daily.  16 g  11  . losartan-hydrochlorothiazide (HYZAAR) 100-12.5 MG per tablet Take 1 tablet by mouth daily.        Marland Kitchen lovastatin (MEVACOR) 10 MG tablet 10 mg daily.       . montelukast (SINGULAIR) 10 MG tablet Take 10 mg by mouth at bedtime.        . Multiple Minerals-Vitamins (CALCIUM CITRATE +) TABS Take by mouth.      . pantoprazole (PROTONIX) 40 MG tablet Take 1 tablet (40 mg total) by mouth daily at 6 (six) AM.  30 tablet  0  . Multiple Vitamin (MULTIVITAMIN) capsule         No current facility-administered medications on file prior to visit.   No Known Allergies History   Social History  . Marital Status: Married    Spouse Name: N/A    Number of Children: N/A  . Years of Education: N/A   Occupational History  . Not on file.   Social History Main Topics  . Smoking  status: Former Smoker    Start date: 01/08/1975  . Smokeless tobacco: Not on file  . Alcohol Use: .5 - 1 oz/week    1-2 drink(s) per week  . Drug Use: No  . Sexually Active: Not on file   Other Topics Concern  . Not on file   Social History Narrative  . No narrative on file      Review of Systems  All other systems reviewed and are negative.       Objective:   Physical Exam  Vitals reviewed. Constitutional: He appears well-developed and well-nourished.  HENT:  Right Ear: External ear normal.  Left Ear: External ear normal.  Nose: Nose normal.  Mouth/Throat: Oropharynx is clear and moist. No oropharyngeal exudate.  Neck: Neck supple.  Cardiovascular: Normal rate and normal heart sounds.   Pulmonary/Chest: Effort normal. No respiratory distress. He has wheezes. He has no rales.  Lymphadenopathy:    He has no cervical adenopathy.          Assessment & Plan:  1. Asthma attack Treat asthma exacerbation  with steroid taper and PRN. Albuterol.  In effort to prevent future asthma exacerbations, increase Advair 250/50 one inhalation twice a day. - predniSONE (DELTASONE) 20 MG tablet; 3 tabs poqday 1-2, 2 tabs poqday 3-4, 1 tab poqday 5-6  Dispense: 12 tablet; Refill: 0 - albuterol (PROVENTIL HFA;VENTOLIN HFA) 108 (90 BASE) MCG/ACT inhaler; Inhale 2 puffs into the lungs every 6 (six) hours as needed for wheezing.  Dispense: 1 Inhaler; Refill: 0 - Fluticasone-Salmeterol (ADVAIR DISKUS) 250-50 MCG/DOSE AEPB; Inhale 1 puff into the lungs 2 (two) times daily.  Dispense: 1 each; Refill: 11

## 2013-08-06 ENCOUNTER — Ambulatory Visit (INDEPENDENT_AMBULATORY_CARE_PROVIDER_SITE_OTHER): Payer: Medicare Other | Admitting: Family Medicine

## 2013-08-06 ENCOUNTER — Encounter: Payer: Self-pay | Admitting: Family Medicine

## 2013-08-06 VITALS — BP 100/70 | HR 82 | Temp 98.1°F | Resp 18 | Wt 237.0 lb

## 2013-08-06 DIAGNOSIS — J45901 Unspecified asthma with (acute) exacerbation: Secondary | ICD-10-CM

## 2013-08-06 DIAGNOSIS — I10 Essential (primary) hypertension: Secondary | ICD-10-CM

## 2013-08-06 MED ORDER — PREDNISONE 20 MG PO TABS: ORAL_TABLET | ORAL | Status: DC

## 2013-08-06 MED ORDER — BUDESONIDE-FORMOTEROL FUMARATE 160-4.5 MCG/ACT IN AERO: 2.0000 | INHALATION_SPRAY | Freq: Two times a day (BID) | RESPIRATORY_TRACT | Status: DC

## 2013-08-06 MED ORDER — HYDROCHLOROTHIAZIDE 25 MG PO TABS: 25.0000 mg | ORAL_TABLET | Freq: Every day | ORAL | Status: DC

## 2013-08-06 NOTE — Progress Notes (Signed)
Subjective:    Patient ID: Mario [name], male    DOB: [birth year], 67 y.o.   MRN: [id number]  HPI Patient is currently taking Advair 250/50 one inhalation twice a day. Even doing so he is requiring albuterol once a day to help wheezing. His wheezing is getting steadily worse. He is reporting dyspnea on exertion. Reporting some time shortness of breath at rest with audible wheezing. He denies a productive cough. He denies fever. Denies chills. He denies hemoptysis. He denies peripheral edema or chest pain.  Past Medical History  Diagnosis Date  . Obstructive sleep apnea   . Rhinitis   . Biliary colic 2014  . Asthma   . Hyperlipidemia   . Hypertension    Current Outpatient Prescriptions on File Prior to Visit  Medication Sig Dispense Refill  . albuterol (PROVENTIL HFA;VENTOLIN HFA) 108 (90 BASE) MCG/ACT inhaler Inhale 2 puffs into the lungs every 6 (six) hours as needed for wheezing.  1 Inhaler  0  . aspirin 325 MG tablet Take 325 mg by mouth daily.       Marland Kitchen atorvastatin (LIPITOR) 40 MG tablet Take 40 mg by mouth daily.        . Cholecalciferol (VITAMIN D PO) Take by mouth.      . FIBER PO Take by mouth.      . fish oil-omega-3 fatty acids 1000 MG capsule Take 2 g by mouth daily.        . fluticasone (FLONASE) 50 MCG/ACT nasal spray Place 2 sprays into the nose daily.  16 g  11  . Fluticasone-Salmeterol (ADVAIR DISKUS) 250-50 MCG/DOSE AEPB Inhale 1 puff into the lungs 2 (two) times daily.  1 each  11  . losartan-hydrochlorothiazide (HYZAAR) 100-12.5 MG per tablet Take 1 tablet by mouth daily.        Marland Kitchen lovastatin (MEVACOR) 10 MG tablet 10 mg daily.       . montelukast (SINGULAIR) 10 MG tablet Take 10 mg by mouth at bedtime.        . Multiple Minerals-Vitamins (CALCIUM CITRATE +) TABS Take by mouth.      . Multiple Vitamin (MULTIVITAMIN) capsule        . pantoprazole (PROTONIX) 40 MG tablet Take 1 tablet (40 mg total) by mouth daily at 6 (six) AM.  30 tablet  0  . Zinc 50 MG CAPS Take by  mouth.       No current facility-administered medications on file prior to visit.   No Known Allergies History   Social History  . Marital Status: Married    Spouse Name: N/A    Number of Children: N/A  . Years of Education: N/A   Occupational History  . Not on file.   Social History Main Topics  . Smoking status: Former Smoker    Start date: 01/08/1975  . Smokeless tobacco: Not on file  . Alcohol Use: .5 - 1 oz/week    1-2 drink(s) per week  . Drug Use: No  . Sexual Activity: Not on file   Other Topics Concern  . Not on file   Social History Narrative  . No narrative on file     Review of Systems  All other systems reviewed and are negative.       Objective:   Physical Exam  Vitals reviewed. Constitutional: He appears well-developed and well-nourished.  Neck: Neck supple. No JVD present. No thyromegaly present.  Cardiovascular: Normal rate, regular rhythm, normal heart sounds and intact distal pulses.  Exam reveals no gallop and no friction rub.   No murmur heard. Pulmonary/Chest: Effort normal. No respiratory distress. He has wheezes. He has no rales.  Abdominal: Soft. Bowel sounds are normal.  Musculoskeletal: He exhibits edema.  Lymphadenopathy:    He has no cervical adenopathy.   trace bipedal edema.        Assessment & Plan:  1. Asthma with acute exacerbation Begin prednisone taper pack. Discontinued Advair and switch to Symbicort 160/0.52 puffs inhaled twice a day. Recheck in one month. - budesonide-formoterol (SYMBICORT) 160-4.5 MCG/ACT inhaler; Inhale 2 puffs into the lungs 2 (two) times daily.  Dispense: 1 Inhaler; Refill: 3 - predniSONE (DELTASONE) 20 MG tablet; 3 tabs poqday 1-3, 2 tabs poqday 4-6, 1 tab poqday 7-  Dispense: 18 tablet; Refill: 0  2. HTN (hypertension) Blood pressure is so well controlled I will discontinue Hyzaar and switch to hydrochlorothiazide 25 mg by mouth daily. Recheck blood pressure in one month. -  hydrochlorothiazide (HYDRODIURIL) 25 MG tablet; Take 1 tablet (25 mg total) by mouth daily.  Dispense: 90 tablet; Refill: 3

## 2013-08-12 ENCOUNTER — Other Ambulatory Visit: Payer: Self-pay | Admitting: Family Medicine

## 2013-08-12 NOTE — Telephone Encounter (Signed)
?   OK to Refill  

## 2013-08-12 NOTE — Telephone Encounter (Signed)
Denied.

## 2013-10-08 ENCOUNTER — Telehealth: Payer: Self-pay | Admitting: Family Medicine

## 2013-10-08 MED ORDER — ATORVASTATIN CALCIUM 40 MG PO TABS: 40.0000 mg | ORAL_TABLET | Freq: Every day | ORAL | Status: DC

## 2013-10-08 NOTE — Telephone Encounter (Signed)
Atorvastatin 40 mg 

## 2013-12-09 ENCOUNTER — Other Ambulatory Visit: Payer: Self-pay | Admitting: Physician Assistant

## 2013-12-09 NOTE — Telephone Encounter (Signed)
Medication refilled per protocol. 

## 2013-12-15 ENCOUNTER — Other Ambulatory Visit: Payer: Self-pay | Admitting: Family Medicine

## 2013-12-26 ENCOUNTER — Other Ambulatory Visit: Payer: Self-pay | Admitting: Family Medicine

## 2014-01-09 ENCOUNTER — Other Ambulatory Visit: Payer: Self-pay | Admitting: Family Medicine

## 2014-02-17 ENCOUNTER — Ambulatory Visit (INDEPENDENT_AMBULATORY_CARE_PROVIDER_SITE_OTHER): Payer: Medicare Other | Admitting: Family Medicine

## 2014-02-17 ENCOUNTER — Encounter: Payer: Self-pay | Admitting: Family Medicine

## 2014-02-17 VITALS — BP 120/70 | HR 68 | Temp 97.4°F | Resp 18 | Wt 245.0 lb

## 2014-02-17 DIAGNOSIS — Z23 Encounter for immunization: Secondary | ICD-10-CM

## 2014-02-17 DIAGNOSIS — I10 Essential (primary) hypertension: Secondary | ICD-10-CM

## 2014-02-17 DIAGNOSIS — E785 Hyperlipidemia, unspecified: Secondary | ICD-10-CM

## 2014-02-17 MED ORDER — MONTELUKAST SODIUM 10 MG PO TABS: ORAL_TABLET | ORAL | Status: DC

## 2014-02-17 MED ORDER — HYDROCHLOROTHIAZIDE 25 MG PO TABS: 25.0000 mg | ORAL_TABLET | Freq: Every day | ORAL | Status: DC

## 2014-02-17 MED ORDER — ATORVASTATIN CALCIUM 40 MG PO TABS: ORAL_TABLET | ORAL | Status: DC

## 2014-02-17 NOTE — Progress Notes (Signed)
Subjective:    Patient ID: Mario [name], male    DOB: June 13, 1946, 68 y.o.   MRN: [id number]  HPI Patient is a very pleasant gentleman 59 years history of hypertension hyperlipidemia. History taking had chlorothiazide 25 mg by mouth daily for hypertension. He denies any chest pain or shortness of breath. He does have occasional cramps in his legs. He is also taking Lipitor 40 mg by mouth daily for hyperlipidemia. He denies any myalgias right quadrant pain. He is overdue for a CMP and a fasting lipid panel. Otherwise he has no concerns. He does request that I give him a prescription for Zostavax that he may take his pharmacy. He is also due for Prevnar 13 given his history of asthma and his age. Past Medical History  Diagnosis Date  . Obstructive sleep apnea   . Rhinitis   . Biliary colic 0865  . Asthma   . Hyperlipidemia   . Hypertension    Current Outpatient Prescriptions on File Prior to Visit  Medication Sig Dispense Refill  . albuterol (PROVENTIL HFA;VENTOLIN HFA) 108 (90 BASE) MCG/ACT inhaler Inhale 2 puffs into the lungs every 6 (six) hours as needed for wheezing.  1 Inhaler  0  . aspirin 325 MG tablet Take 325 mg by mouth daily.       . Cholecalciferol (VITAMIN D PO) Take by mouth.      . FIBER PO Take by mouth.      . fish oil-omega-3 fatty acids 1000 MG capsule Take 2 g by mouth daily.        . fluticasone (FLONASE) 50 MCG/ACT nasal spray Place 2 sprays into the nose daily.  16 g  11  . Fluticasone-Salmeterol (ADVAIR DISKUS) 250-50 MCG/DOSE AEPB Inhale 1 puff into the lungs 2 (two) times daily.  1 each  11  . losartan-hydrochlorothiazide (HYZAAR) 100-12.5 MG per tablet Take 1 tablet by mouth daily.        . Multiple Minerals-Vitamins (CALCIUM CITRATE +) TABS Take by mouth.      . Multiple Vitamin (MULTIVITAMIN) capsule        . pantoprazole (PROTONIX) 40 MG tablet Take 1 tablet (40 mg total) by mouth daily at 6 (six) AM.  30 tablet  0  . SYMBICORT 160-4.5 MCG/ACT inhaler  INHALE 2 PUFFS INTO THE LUNGS 2 (TWO) TIMES DAILY.  1 Inhaler  11  . Zinc 50 MG CAPS Take by mouth.       No current facility-administered medications on file prior to visit.   No Known Allergies History   Social History  . Marital Status: Married    Spouse Name: N/A    Number of Children: N/A  . Years of Education: N/A   Occupational History  . Not on file.   Social History Main Topics  . Smoking status: Former Smoker    Start date: 01/08/1975  . Smokeless tobacco: Not on file  . Alcohol Use: .5 - 1 oz/week    1-2 drink(s) per week  . Drug Use: No  . Sexual Activity: Not on file   Other Topics Concern  . Not on file   Social History Narrative  . No narrative on file      Review of Systems  All other systems reviewed and are negative.       Objective:   Physical Exam  Vitals reviewed. Constitutional: He appears well-developed and well-nourished.  Neck: Neck supple. No JVD present. No thyromegaly present.  Cardiovascular: Normal rate, regular rhythm  and normal heart sounds.  Exam reveals no gallop and no friction rub.   No murmur heard. Pulmonary/Chest: Effort normal and breath sounds normal. No respiratory distress. He has no wheezes. He has no rales.  Abdominal: Soft. Bowel sounds are normal. He exhibits no distension. There is no tenderness. There is no rebound and no guarding.  Musculoskeletal: He exhibits no edema.  Lymphadenopathy:    He has no cervical adenopathy.          Assessment & Plan:  1. HTN (hypertension) Patient's blood pressure is excellent. Continue had chlorothiazide 25 mg by mouth daily. - hydrochlorothiazide (HYDRODIURIL) 25 MG tablet; Take 1 tablet (25 mg total) by mouth daily.  Dispense: 90 tablet; Refill: 3  2. HLD (hyperlipidemia) Return fasting for a fasting lipid panel and CMP. The patient's goal LDL is less than 130. I also need to check his liver function. - COMPLETE METABOLIC PANEL WITH GFR; Future - Lipid panel;  Future  Patient received Prevnar 13.

## 2014-02-18 ENCOUNTER — Other Ambulatory Visit: Payer: Medicare Other

## 2014-02-18 DIAGNOSIS — E785 Hyperlipidemia, unspecified: Secondary | ICD-10-CM

## 2014-02-18 LAB — LIPID PANEL
CHOLESTEROL: 188 mg/dL (ref 0–200)
HDL: 49 mg/dL (ref >39)
LDL Cholesterol: 120 mg/dL — ABNORMAL HIGH (ref 0–99)
TRIGLYCERIDES: 93 mg/dL (ref <150)
Total CHOL/HDL Ratio: 3.8 Ratio
VLDL: 19 mg/dL (ref 0–40)

## 2014-02-18 LAB — COMPLETE METABOLIC PANEL WITH GFR
ALT: 26 U/L (ref 0–53)
AST: 22 U/L (ref 0–37)
Albumin: 3.9 g/dL (ref 3.5–5.2)
Alkaline Phosphatase: 50 U/L (ref 39–117)
BILIRUBIN TOTAL: 1 mg/dL (ref 0.2–1.2)
BUN: 20 mg/dL (ref 6–23)
CALCIUM: 9.3 mg/dL (ref 8.4–10.5)
CHLORIDE: 100 meq/L (ref 96–112)
CO2: 31 meq/L (ref 19–32)
CREATININE: 1 mg/dL (ref 0.50–1.35)
GFR, EST AFRICAN AMERICAN: 89 mL/min
GFR, EST NON AFRICAN AMERICAN: 77 mL/min
Glucose, Bld: 90 mg/dL (ref 70–99)
Potassium: 4.5 mEq/L (ref 3.5–5.3)
Sodium: 139 mEq/L (ref 135–145)
Total Protein: 6.6 g/dL (ref 6.0–8.3)

## 2014-07-14 ENCOUNTER — Other Ambulatory Visit: Payer: Medicare Other

## 2014-07-15 ENCOUNTER — Other Ambulatory Visit: Payer: Self-pay | Admitting: Family Medicine

## 2014-07-15 DIAGNOSIS — Z125 Encounter for screening for malignant neoplasm of prostate: Secondary | ICD-10-CM

## 2014-07-15 DIAGNOSIS — Z0000 Encounter for general adult medical examination without abnormal findings: Secondary | ICD-10-CM

## 2014-07-15 DIAGNOSIS — I10 Essential (primary) hypertension: Secondary | ICD-10-CM

## 2014-07-15 DIAGNOSIS — Z79899 Other long term (current) drug therapy: Secondary | ICD-10-CM

## 2014-07-15 DIAGNOSIS — E785 Hyperlipidemia, unspecified: Secondary | ICD-10-CM

## 2014-07-17 ENCOUNTER — Other Ambulatory Visit: Payer: Medicare Other

## 2014-07-18 ENCOUNTER — Other Ambulatory Visit: Payer: Medicare Other

## 2014-07-18 DIAGNOSIS — I10 Essential (primary) hypertension: Secondary | ICD-10-CM

## 2014-07-18 DIAGNOSIS — E785 Hyperlipidemia, unspecified: Secondary | ICD-10-CM

## 2014-07-18 DIAGNOSIS — Z0000 Encounter for general adult medical examination without abnormal findings: Secondary | ICD-10-CM

## 2014-07-18 DIAGNOSIS — Z79899 Other long term (current) drug therapy: Secondary | ICD-10-CM

## 2014-07-18 DIAGNOSIS — Z125 Encounter for screening for malignant neoplasm of prostate: Secondary | ICD-10-CM

## 2014-07-18 LAB — LIPID PANEL
CHOL/HDL RATIO: 3.1 ratio
Cholesterol: 146 mg/dL (ref 0–200)
HDL: 47 mg/dL (ref >39)
LDL CALC: 84 mg/dL (ref 0–99)
TRIGLYCERIDES: 76 mg/dL (ref <150)
VLDL: 15 mg/dL (ref 0–40)

## 2014-07-18 LAB — CBC WITH DIFFERENTIAL/PLATELET
BASOS ABS: 0 10*3/uL (ref 0.0–0.1)
BASOS PCT: 0 % (ref 0–1)
Eosinophils Absolute: 0.5 10*3/uL (ref 0.0–0.7)
Eosinophils Relative: 5 % (ref 0–5)
HEMATOCRIT: 44.1 % (ref 39.0–52.0)
Hemoglobin: 15.5 g/dL (ref 13.0–17.0)
Lymphocytes Relative: 23 % (ref 12–46)
Lymphs Abs: 2.1 10*3/uL (ref 0.7–4.0)
MCH: 30.2 pg (ref 26.0–34.0)
MCHC: 35.1 g/dL (ref 30.0–36.0)
MCV: 86 fL (ref 78.0–100.0)
MONO ABS: 0.8 10*3/uL (ref 0.1–1.0)
Monocytes Relative: 9 % (ref 3–12)
NEUTROS ABS: 5.9 10*3/uL (ref 1.7–7.7)
NEUTROS PCT: 63 % (ref 43–77)
PLATELETS: 295 10*3/uL (ref 150–400)
RBC: 5.13 MIL/uL (ref 4.22–5.81)
RDW: 14.2 % (ref 11.5–15.5)
WBC: 9.3 10*3/uL (ref 4.0–10.5)

## 2014-07-18 LAB — COMPLETE METABOLIC PANEL WITH GFR
ALK PHOS: 51 U/L (ref 39–117)
ALT: 24 U/L (ref 0–53)
AST: 23 U/L (ref 0–37)
Albumin: 3.8 g/dL (ref 3.5–5.2)
BILIRUBIN TOTAL: 1.3 mg/dL — AB (ref 0.2–1.2)
BUN: 20 mg/dL (ref 6–23)
CO2: 32 mEq/L (ref 19–32)
Calcium: 9.4 mg/dL (ref 8.4–10.5)
Chloride: 101 mEq/L (ref 96–112)
Creat: 1 mg/dL (ref 0.50–1.35)
GFR, EST NON AFRICAN AMERICAN: 76 mL/min
GFR, Est African American: 88 mL/min
GLUCOSE: 93 mg/dL (ref 70–99)
Potassium: 4.5 mEq/L (ref 3.5–5.3)
SODIUM: 141 meq/L (ref 135–145)
TOTAL PROTEIN: 6.4 g/dL (ref 6.0–8.3)

## 2014-07-18 LAB — TSH: TSH: 1.555 u[IU]/mL (ref 0.350–4.500)

## 2014-07-19 LAB — PSA, MEDICARE: PSA: 0.42 ng/mL (ref <=4.00)

## 2014-07-25 ENCOUNTER — Encounter: Payer: Self-pay | Admitting: Family Medicine

## 2014-07-25 ENCOUNTER — Other Ambulatory Visit: Payer: Self-pay | Admitting: Family Medicine

## 2014-07-25 ENCOUNTER — Ambulatory Visit (INDEPENDENT_AMBULATORY_CARE_PROVIDER_SITE_OTHER): Payer: Medicare Other | Admitting: Family Medicine

## 2014-07-25 VITALS — BP 136/80 | HR 80 | Temp 97.7°F | Resp 18 | Ht 69.0 in | Wt 240.0 lb

## 2014-07-25 DIAGNOSIS — Z0000 Encounter for general adult medical examination without abnormal findings: Secondary | ICD-10-CM

## 2014-07-25 NOTE — Progress Notes (Signed)
Subjective:    Patient ID: Mario [name], male    DOB: 08/10/1946, 68 y.o.   MRN: [id number]  HPI  Subjective:   Patient presents for Medicare Annual/Subsequent preventive examination.   He has no medical concerns. He does have an erythematous scaly white papule on his left shoulder that has been there for several months. 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 His approximate 5 mm in size. He is requesting cryotherapy for this today. I did treat the lesion with 30 seconds of liquid nitrogen while the patient is in the office today. I explained to him that if the lesion persists he should proceed with a biopsy.  Otherwise he is doing well. He is due for a colonoscopy. He is also due for her Zostavax. He would be due for his booster on Pneumovax 23 next year. Otherwise his preventive care is up to date Most recent labwork is below: Appointment on 07/18/2014  Component Date Value Ref Range Status  . PSA 07/18/2014 0.42  <=4.00 ng/mL Final   Comment: Test Methodology: ECLIA PSA (Electrochemiluminescence Immunoassay)                                                     For PSA values from 2.5-4.0, particularly in younger men <60 years                          old, the AUA and NCCN suggest testing for % Free PSA (3515) and                          evaluation of the rate of increase in PSA (PSA velocity).  . Cholesterol 07/18/2014 146  0 - 200 mg/dL Final   Comment: ATP III Classification:                                < 200        mg/dL        Desirable                               200 - 239     mg/dL        Borderline High                               >= 240        mg/dL        High                             . Triglycerides 07/18/2014 76  <150 mg/dL Final  . HDL 07/18/2014 47  >39 mg/dL Final  . Total CHOL/HDL Ratio 07/18/2014 3.1   Final  . VLDL 07/18/2014 15  0 - 40 mg/dL Final  . LDL Cholesterol 07/18/2014 84  0 - 99 mg/dL Final   Comment:                    Total Cholesterol/HDL Ratio:CHD Risk  Coronary Heart Disease Risk Table                                                                 Men       Women                                   1/2 Average Risk              3.4        3.3                                       Average Risk              5.0        4.4                                    2X Average Risk              9.6        7.1                                    3X Average Risk             23.4       11.0                          Use the calculated Patient Ratio above and the CHD Risk table                           to determine the patient's CHD Risk.                          ATP III Classification (LDL):                                < 100        mg/dL         Optimal                               100 - 129     mg/dL         Near or Above Optimal                               130 - 159     mg/dL         Borderline High                               160 - 189     mg/dL           High                                > 190        mg/dL         Very High                             . WBC 07/18/2014 9.3  4.0 - 10.5 K/uL Final  . RBC 07/18/2014 5.13  4.22 - 5.81 MIL/uL Final  . Hemoglobin 07/18/2014 15.5  13.0 - 17.0 g/dL Final  . HCT 07/18/2014 44.1  39.0 - 52.0 % Final  . MCV 07/18/2014 86.0  78.0 - 100.0 fL Final  . MCH 07/18/2014 30.2  26.0 - 34.0 pg Final  . MCHC 07/18/2014 35.1  30.0 - 36.0 g/dL Final  . RDW 07/18/2014 14.2  11.5 - 15.5 % Final  . Platelets 07/18/2014 295  150 - 400 K/uL Final  . Neutrophils Relative % 07/18/2014 63  43 - 77 % Final  . Neutro Abs 07/18/2014 5.9  1.7 - 7.7 K/uL Final  . Lymphocytes Relative 07/18/2014 23  12 - 46 % Final  . Lymphs Abs 07/18/2014 2.1  0.7 - 4.0 K/uL Final  . Monocytes Relative 07/18/2014 9  3 - 12 % Final  . Monocytes Absolute 07/18/2014 0.8  0.1 - 1.0 K/uL Final  . Eosinophils Relative 07/18/2014 5  0 - 5 % Final  .  Eosinophils Absolute 07/18/2014 0.5  0.0 - 0.7 K/uL Final  . Basophils Relative 07/18/2014 0  0 - 1 % Final  . Basophils Absolute 07/18/2014 0.0  0.0 - 0.1 K/uL Final  . Smear Review 07/18/2014 Criteria for review not met   Final  . TSH 07/18/2014 1.555  0.350 - 4.500 uIU/mL Final  . Sodium 07/18/2014 141  135 - 145 mEq/L Final  . Potassium 07/18/2014 4.5  3.5 - 5.3 mEq/L Final  . Chloride 07/18/2014 101  96 - 112 mEq/L Final  . CO2 07/18/2014 32  19 - 32 mEq/L Final  . Glucose, Bld 07/18/2014 93  70 - 99 mg/dL Final  . BUN 07/18/2014 20  6 - 23 mg/dL Final  . Creat 07/18/2014 1.00  0.50 - 1.35 mg/dL Final  . Total Bilirubin 07/18/2014 1.3* 0.2 - 1.2 mg/dL Final  . Alkaline Phosphatase 07/18/2014 51  39 - 117 U/L Final  . AST 07/18/2014 23  0 - 37 U/L Final  . ALT 07/18/2014 24  0 - 53 U/L Final  . Total Protein 07/18/2014 6.4  6.0 - 8.3 g/dL Final  . Albumin 07/18/2014 3.8  3.5 - 5.2 g/dL Final  . Calcium 07/18/2014 9.4  8.4 - 10.5 mg/dL Final  . GFR, Est African American 07/18/2014 88   Final  . GFR, Est Non African American 07/18/2014 76   Final   Comment:                            The estimated GFR is a calculation valid for adults (>=35 years old)                          that uses the CKD-EPI algorithm to adjust for age and sex. It is  not to be used for children, pregnant women, hospitalized patients,                             patients on dialysis, or with rapidly changing kidney function.                          According to the NKDEP, eGFR >89 is normal, 60-89 shows mild                          impairment, 30-59 shows moderate impairment, 15-29 shows severe                          impairment and <15 is ESRD.                                Review Past Medical/Family/Social: Past Medical History  Diagnosis Date  . Obstructive sleep apnea   . Rhinitis   . Biliary colic 5465  . Asthma   . Hyperlipidemia   . Hypertension    No past  surgical history on file. Current Outpatient Prescriptions on File Prior to Visit  Medication Sig Dispense Refill  . albuterol (PROVENTIL HFA;VENTOLIN HFA) 108 (90 BASE) MCG/ACT inhaler Inhale 2 puffs into the lungs every 6 (six) hours as needed for wheezing.  1 Inhaler  0  . aspirin 325 MG tablet Take 325 mg by mouth daily.       Marland Kitchen atorvastatin (LIPITOR) 40 MG tablet TAKE 1 TABLET EVERY DAY  90 tablet  3  . Cholecalciferol (VITAMIN D PO) Take by mouth.      . FIBER PO Take by mouth.      . fish oil-omega-3 fatty acids 1000 MG capsule Take 2 g by mouth daily.        . fluticasone (FLONASE) 50 MCG/ACT nasal spray Place 2 sprays into the nose daily.  16 g  11  . Fluticasone-Salmeterol (ADVAIR DISKUS) 250-50 MCG/DOSE AEPB Inhale 1 puff into the lungs 2 (two) times daily.  1 each  11  . hydrochlorothiazide (HYDRODIURIL) 25 MG tablet Take 1 tablet (25 mg total) by mouth daily.  90 tablet  3  . losartan-hydrochlorothiazide (HYZAAR) 100-12.5 MG per tablet Take 1 tablet by mouth daily.        . montelukast (SINGULAIR) 10 MG tablet TAKE 1 TABLET BY MOUTH EVERY DAY  90 tablet  3  . Multiple Minerals-Vitamins (CALCIUM CITRATE +) TABS Take by mouth.      . Multiple Vitamin (MULTIVITAMIN) capsule        . pantoprazole (PROTONIX) 40 MG tablet Take 1 tablet (40 mg total) by mouth daily at 6 (six) AM.  30 tablet  0  . SYMBICORT 160-4.5 MCG/ACT inhaler INHALE 2 PUFFS INTO THE LUNGS 2 (TWO) TIMES DAILY.  1 Inhaler  11  . Zinc 50 MG CAPS Take by mouth.       No current facility-administered medications on file prior to visit.   No Known Allergies History   Social History  . Marital Status: Married    Spouse Name: N/A    Number of Children: N/A  . Years of Education: N/A   Occupational History  . Not on file.   Social History Main Topics  . Smoking status: Former Smoker  Start date: 01/08/1975  . Smokeless tobacco: Not on file  . Alcohol Use: 0.5 - 1.0 oz/week    1-2 drink(s) per week  . Drug  Use: No  . Sexual Activity: Not on file   Other Topics Concern  . Not on file   Social History Narrative  . No narrative on file   No family history on file.   Depression Screen  (Note: if answer to either of the following is "Yes", a more complete depression screening is indicated)  Over the past two weeks, have you felt down, depressed or hopeless? No Over the past two weeks, have you felt little interest or pleasure in doing things? No Have you lost interest or pleasure in daily life? No Do you often feel hopeless? No Do you cry easily over simple problems? No   Activities of Daily Living  In your present state of health, do you have any difficulty performing the following activities?:  Driving? No  Managing money? No  Feeding yourself? No  Getting from bed to chair? No  Climbing a flight of stairs? No  Preparing food and eating?: No  Bathing or showering? No  Getting dressed: No  Getting to the toilet? No  Using the toilet:No  Moving around from place to place: No  In the past year have you fallen or had a near fall?:No  Are you sexually active? No  Do you have more than one partner? No   Hearing Difficulties: No  Do you often ask people to speak up or repeat themselves? No  Do you experience ringing or noises in your ears? No Do you have difficulty understanding soft or whispered voices? No  Do you feel that you have a problem with memory? No Do you often misplace items? No  Do you feel safe at home? Yes  Cognitive Testing  Alert? Yes Normal Appearance?Yes  Oriented to person? Yes Place? Yes  Time? Yes  Recall of three objects? Yes  Can perform simple calculations? Yes  Displays appropriate judgment?Yes  Can read the correct time from a watch face?Yes   Screening Tests / Date Colonoscopy 03/2004                    Zostavax  Pneumovax 2010 Prevnar 2015  Tetanus/tdap 2010  Review of Systems  All other systems reviewed and are negative.        Objective:   Physical Exam  Vitals reviewed. Constitutional: He is oriented to person, place, and time. He appears well-developed and well-nourished. No distress.  HENT:  Head: Normocephalic and atraumatic.  Right Ear: External ear normal.  Left Ear: External ear normal.  Nose: Nose normal.  Mouth/Throat: Oropharynx is clear and moist. No oropharyngeal exudate.  Eyes: Conjunctivae are normal. Pupils are equal, round, and reactive to light. Right eye exhibits no discharge. Left eye exhibits no discharge. No scleral icterus.  Neck: Normal range of motion. Neck supple. No JVD present. No tracheal deviation present. No thyromegaly present.  Cardiovascular: Normal rate, regular rhythm, normal heart sounds and intact distal pulses.  Exam reveals no gallop and no friction rub.   No murmur heard. Pulmonary/Chest: Effort normal and breath sounds normal. No stridor. No respiratory distress. He has no wheezes. He has no rales. He exhibits no tenderness.  Abdominal: Soft. Bowel sounds are normal. He exhibits no distension and no mass. There is no tenderness. There is no rebound and no guarding.  Genitourinary: Rectum normal, prostate normal and penis normal.  Musculoskeletal: Normal range of motion. He exhibits no edema and no tenderness.  Lymphadenopathy:    He has no cervical adenopathy.  Neurological: He is alert and oriented to person, place, and time. He has normal reflexes. He displays normal reflexes. No cranial nerve deficit. He exhibits normal muscle tone. Coordination normal.  Skin: Skin is warm. No rash noted. He is not diaphoretic. No erythema. No pallor.  Psychiatric: He has a normal mood and affect. His behavior is normal. Judgment and thought content normal.          Assessment & Plan:  1. Routine general medical examination at a health care facility Physical exam is normal. Lab work is normal. I will schedule the patient for colonoscopy. I recommended a booster on Pneumovax next  year. I gave the patient prescription for Zostavax and instructed him to get this at his local pharmacy.  I also treated the lesion on his left shoulder with liquid nitrogen cryotherapy for 30 seconds. I recommended rebiopsy this lesion persist. Otherwise his preventative care is up-to-date. Follow up in 6 months or as needed. I did recommend therapeutic lifestyle changes to try to address his obesity. Patient is currently joining [employer]. I like to see him lose 20 pounds by his next office visit.    Screen negative for depression Medicare Attestation  I have personally reviewed:  The patient's medical and social history  Their use of alcohol, tobacco or illicit drugs  Their current medications and supplements  The patient's functional ability including ADLs,fall risks, home safety risks, cognitive, and hearing and visual impairment  Diet and physical activities  Evidence for depression or mood disorders  The patient's weight, height, BMI, and visual acuity have been recorded in the chart. I have made referrals, counseling, and provided education to the patient based on review of the above and I have provided the patient with a written personalized care plan for preventive services.

## 2014-07-29 ENCOUNTER — Encounter: Payer: Self-pay | Admitting: Gastroenterology

## 2014-09-15 ENCOUNTER — Ambulatory Visit (AMBULATORY_SURGERY_CENTER): Payer: Medicare Other | Admitting: *Deleted

## 2014-09-15 VITALS — Ht 69.0 in | Wt 244.2 lb

## 2014-09-15 DIAGNOSIS — Z86010 Personal history of colonic polyps: Secondary | ICD-10-CM

## 2014-09-15 MED ORDER — MOVIPREP 100 G PO SOLR: ORAL | Status: DC

## 2014-09-15 NOTE — Progress Notes (Signed)
No allergies to eggs or soy. No problems with anesthesia.  Pt given Emmi instructions for colonoscopy  No oxygen use  No diet drug use  

## 2014-09-19 ENCOUNTER — Encounter: Payer: Self-pay | Admitting: Gastroenterology

## 2014-09-29 ENCOUNTER — Encounter: Payer: Self-pay | Admitting: Gastroenterology

## 2014-09-29 ENCOUNTER — Ambulatory Visit (AMBULATORY_SURGERY_CENTER): Payer: Medicare Other | Admitting: Gastroenterology

## 2014-09-29 VITALS — BP 152/92 | HR 50 | Temp 97.9°F | Resp 15 | Ht 69.0 in | Wt 244.0 lb

## 2014-09-29 DIAGNOSIS — Z86010 Personal history of colonic polyps: Secondary | ICD-10-CM

## 2014-09-29 DIAGNOSIS — D122 Benign neoplasm of ascending colon: Secondary | ICD-10-CM

## 2014-09-29 DIAGNOSIS — D124 Benign neoplasm of descending colon: Secondary | ICD-10-CM

## 2014-09-29 DIAGNOSIS — D128 Benign neoplasm of rectum: Secondary | ICD-10-CM

## 2014-09-29 DIAGNOSIS — K5730 Diverticulosis of large intestine without perforation or abscess without bleeding: Secondary | ICD-10-CM

## 2014-09-29 DIAGNOSIS — D129 Benign neoplasm of anus and anal canal: Secondary | ICD-10-CM

## 2014-09-29 HISTORY — PX: COLONOSCOPY: SHX174

## 2014-09-29 MED ORDER — SODIUM CHLORIDE 0.9 % IV SOLN: 500.0000 mL | INTRAVENOUS | Status: DC

## 2014-09-29 NOTE — Op Note (Signed)
[place]  [address] [place], [id number]   COLONOSCOPY PROCEDURE REPORT  PATIENT: Mario [name], Mario [name]  MR#: [id number] BIRTHDATE: May 26, 1946 , 87  yrs. old GENDER: male ENDOSCOPIST: Milus Banister, MD PROCEDURE DATE:  09/29/2014 PROCEDURE:   Colonoscopy with snare polypectomy First Screening Colonoscopy - Avg.  risk and is 50 yrs.  old or older - No.  Prior Negative Screening - Now for repeat screening. N/A  History of Adenoma - Now for follow-up colonoscopy & has been > or = to 3 yrs.  Yes hx of adenoma.  Has been 3 or more years since last colonoscopy.  Polyps Removed Today? Yes. ASA CLASS:   Class II INDICATIONS:Colonoscopy 2005 Dr.  Sharlett Iles "multiple polyps 1-69mm" some of these were adenomatous on pathology.  Colonsocopy Dr. Sharlett Iles 2008 found no polyps.Marland Kitchen MEDICATIONS: Monitored anesthesia care and Propofol 270 mg IV  DESCRIPTION OF PROCEDURE:   After the risks benefits and alternatives of the procedure were thoroughly explained, informed consent was obtained.  The digital rectal exam revealed no abnormalities of the rectum.   The [id number]  endoscope was introduced through the anus and advanced to the cecum, which was identified by both the appendix and ileocecal valve. No adverse events experienced.   The quality of the prep was good.  The instrument was then slowly withdrawn as the colon was fully examined.  COLON FINDINGS: Five sessile polyps ranging between 3-41mm in size were found in the descending colon, rectum, and ascending colon. Polypectomies were performed with a cold snare.  The resection was complete, the polyp tissue was completely retrieved and sent to histology.   There was mild diverticulosis noted throughout the entire examined colon.   The examination was otherwise normal. Retroflexed views revealed no abnormalities. The time to cecum=2 minutes 57 seconds.  Withdrawal time=16 minutes 38 seconds.  The scope was withdrawn  and the procedure completed. COMPLICATIONS: There were no immediate complications.  ENDOSCOPIC IMPRESSION: 1.  Five sessile polyps ranging between 3-65mm in size were found in the descending colon, rectum, and ascending colon; polypectomies were performed with a cold snare 2.   Mild diverticulosis was noted throughout the entire examined colon 3.   The examination was otherwise normal  RECOMMENDATIONS: If the polyp(s) removed today are proven to be adenomatous (pre-cancerous) polyps, you will need a colonoscopy in 3-5 years. Otherwise you should continue to follow colorectal cancer screening guidelines for "routine risk" patients with a colonoscopy in 10 years.  You will receive a letter within 1-2 weeks with the results of your biopsy as well as final recommendations.  Please call my office if you have not received a letter after 3 weeks.  eSigned:  Milus Banister, MD 09/29/2014 9:41 AM   cc: Jenna Luo, MD

## 2014-09-29 NOTE — Patient Instructions (Signed)
Impressions/recommendations:  Polyps (handout given) Diverticulosis (handout given) High fiber diet (handout given)  Repeat colonoscopy pending pathology results.  YOU HAD AN ENDOSCOPIC PROCEDURE TODAY AT THE [facility]: Refer to the procedure report that was given to you for any specific questions about what was found during the examination.  If the procedure report does not answer your questions, please call your gastroenterologist to clarify.  If you requested that your care partner not be given the details of your procedure findings, then the procedure report has been included in a sealed envelope for you to review at your convenience later.  YOU SHOULD EXPECT: Some feelings of bloating in the abdomen. Passage of more gas than usual.  Walking can help get rid of the air that was put into your GI tract during the procedure and reduce the bloating. If you had a lower endoscopy (such as a colonoscopy or flexible sigmoidoscopy) you may notice spotting of blood in your stool or on the toilet paper. If you underwent a bowel prep for your procedure, then you may not have a normal bowel movement for a few days.  DIET: Your first meal following the procedure should be a light meal and then it is ok to progress to your normal diet.  A half-sandwich or bowl of soup is an example of a good first meal.  Heavy or fried foods are harder to digest and may make you feel nauseous or bloated.  Likewise meals heavy in dairy and vegetables can cause extra gas to form and this can also increase the bloating.  Drink plenty of fluids but you should avoid alcoholic beverages for 24 hours.  ACTIVITY: Your care partner should take you home directly after the procedure.  You should plan to take it easy, moving slowly for the rest of the day.  You can resume normal activity the day after the procedure however you should NOT DRIVE or use heavy machinery for 24 hours (because of the sedation medicines used during  the test).    SYMPTOMS TO REPORT IMMEDIATELY: A gastroenterologist can be reached at any hour.  During normal business hours, 8:30 AM to 5:00 PM Monday through Friday, call [phone].  After hours and on weekends, please call the GI answering service at [phone] who will take a message and have the physician on call contact you.   Following lower endoscopy (colonoscopy or flexible sigmoidoscopy):  Excessive amounts of blood in the stool  Significant tenderness or worsening of abdominal pains  Swelling of the abdomen that is new, acute  Fever of 100F or higher  FOLLOW UP: If any biopsies were taken you will be contacted by phone or by letter within the next 1-3 weeks.  Call your gastroenterologist if you have not heard about the biopsies in 3 weeks.  Our staff will call the home number listed on your records the next business day following your procedure to check on you and address any questions or concerns that you may have at that time regarding the information given to you following your procedure. This is a courtesy call and so if there is no answer at the home number and we have not heard from you through the emergency physician on call, we will assume that you have returned to your regular daily activities without incident.  SIGNATURES/CONFIDENTIALITY: You and/or your care partner have signed paperwork which will be entered into your electronic medical record.  These signatures attest to the fact that that the information   above on your After Visit Summary has been reviewed and is understood.  Full responsibility of the confidentiality of this discharge information lies with you and/or your care-partner. 

## 2014-09-29 NOTE — Progress Notes (Signed)
Patient denies any allergies to eggs or soy. Patient denies any problems with anesthesia/sedation.

## 2014-09-29 NOTE — Progress Notes (Signed)
Report to PACU, RN, vss, BBS= Clear.  

## 2014-09-29 NOTE — Progress Notes (Signed)
Called to room to assist during endoscopic procedure.  Patient ID and intended procedure confirmed with present staff. Received instructions for my participation in the procedure from the performing physician.  

## 2014-09-30 ENCOUNTER — Telehealth: Payer: Self-pay

## 2014-09-30 NOTE — Telephone Encounter (Signed)
  Follow up Call-  Call back number 09/29/2014  Post procedure Call Back phone  # [phone]  Permission to leave phone message Yes     Patient questions:  Do you have a fever, pain , or abdominal swelling? No. Pain Score  0 *  Have you tolerated food without any problems? Yes.    Have you been able to return to your normal activities? Yes.    Do you have any questions about your discharge instructions: Diet   No. Medications  No. Follow up visit  No.  Do you have questions or concerns about your Care? No.  Actions: * If pain score is 4 or above: No action needed, pain <4.

## 2014-10-02 ENCOUNTER — Encounter: Payer: Self-pay | Admitting: Gastroenterology

## 2015-01-27 ENCOUNTER — Other Ambulatory Visit: Payer: Self-pay | Admitting: Family Medicine

## 2015-01-27 MED ORDER — BUDESONIDE-FORMOTEROL FUMARATE 160-4.5 MCG/ACT IN AERO: INHALATION_SPRAY | RESPIRATORY_TRACT | Status: DC

## 2015-01-27 NOTE — Telephone Encounter (Signed)
Med sent to pharm 

## 2015-02-07 ENCOUNTER — Other Ambulatory Visit: Payer: Self-pay | Admitting: Family Medicine

## 2015-02-09 NOTE — Telephone Encounter (Signed)
Refill appropriate and filled per protocol. 

## 2015-03-12 ENCOUNTER — Encounter: Payer: Self-pay | Admitting: Family Medicine

## 2015-03-12 ENCOUNTER — Ambulatory Visit (INDEPENDENT_AMBULATORY_CARE_PROVIDER_SITE_OTHER): Payer: Medicare Other | Admitting: Family Medicine

## 2015-03-12 VITALS — BP 150/82 | HR 78 | Temp 97.9°F | Resp 16 | Ht 69.0 in | Wt 245.0 lb

## 2015-03-12 DIAGNOSIS — J208 Acute bronchitis due to other specified organisms: Secondary | ICD-10-CM

## 2015-03-12 MED ORDER — AZITHROMYCIN 250 MG PO TABS: ORAL_TABLET | ORAL | Status: DC

## 2015-03-12 NOTE — Progress Notes (Signed)
Subjective:    Patient ID: Mario [name], male    DOB: [birth year], 69 y.o.   MRN: [id number]  HPI Patient is a 69 year old white male with a history of asthma. Over the last 3 weeks he developed a worsening cough. The cough is productive of green sputum. He reports chest congestion, subjective fevers, night sweats, and chills. He also has some burning discomfort in his airways when he coughs and when he breathes deeply. He denies any angina. He denies any shortness of breath. He denies any wheezing. Pulmonary examination today is relatively normal. Past Medical History  Diagnosis Date  . Obstructive sleep apnea   . Rhinitis   . Biliary colic 9147  . Asthma   . Hyperlipidemia   . Hypertension   . Sleep apnea     cpap   Past Surgical History  Procedure Laterality Date  . Lithotripsy  2000    for kidney stone   Current Outpatient Prescriptions on File Prior to Visit  Medication Sig Dispense Refill  . albuterol (PROVENTIL HFA;VENTOLIN HFA) 108 (90 BASE) MCG/ACT inhaler Inhale 2 puffs into the lungs every 6 (six) hours as needed for wheezing. 1 Inhaler 0  . aspirin 325 MG tablet Take 81 mg by mouth daily.     Marland Kitchen atorvastatin (LIPITOR) 40 MG tablet TAKE 1 TABLET BY MOUTH EVERY DAY 90 tablet 3  . budesonide-formoterol (SYMBICORT) 160-4.5 MCG/ACT inhaler INHALE 2 PUFFS INTO THE LUNGS 2 (TWO) TIMES DAILY. 1 Inhaler 5  . Cholecalciferol (VITAMIN D PO) Take 5,000 Units by mouth.     . Coenzyme Q10 (COQ10) 100 MG CAPS Take by mouth daily.    Marland Kitchen FIBER PO Take by mouth.    . fish oil-omega-3 fatty acids 1000 MG capsule Take 1 g by mouth daily.     . fluticasone (FLONASE) 50 MCG/ACT nasal spray SPRAY TWICE INTO THE NOSE DAILY. 16 g 1  . hydrochlorothiazide (HYDRODIURIL) 25 MG tablet Take 1 tablet (25 mg total) by mouth daily. 90 tablet 3  . Melatonin 3 MG CAPS Take by mouth at bedtime.    . montelukast (SINGULAIR) 10 MG tablet TAKE 1 TABLET BY MOUTH EVERY DAY 90 tablet 3  . Probiotic Product  (PROBIOTIC DAILY PO) Take by mouth daily.    . Zinc 50 MG CAPS Take by mouth.     No current facility-administered medications on file prior to visit.   No Known Allergies History   Social History  . Marital Status: Married    Spouse Name: N/A  . Number of Children: N/A  . Years of Education: N/A   Occupational History  . Not on file.   Social History Main Topics  . Smoking status: Former Smoker    Start date: 01/08/1975  . Smokeless tobacco: Never Used  . Alcohol Use: Yes     Comment: 1 glass wine a month  . Drug Use: No  . Sexual Activity: Not on file   Other Topics Concern  . Not on file   Social History Narrative      Review of Systems  All other systems reviewed and are negative.      Objective:   Physical Exam  Constitutional: He appears well-developed and well-nourished.  HENT:  Right Ear: External ear normal.  Left Ear: External ear normal.  Nose: Nose normal.  Mouth/Throat: Oropharynx is clear and moist. No oropharyngeal exudate.  Eyes: No scleral icterus.  Neck: Neck supple. No JVD present.  Cardiovascular: Normal rate, regular rhythm  and normal heart sounds.   No murmur heard. Pulmonary/Chest: Effort normal and breath sounds normal. No respiratory distress. He has no wheezes. He has no rales. He exhibits no tenderness.  Lymphadenopathy:    He has no cervical adenopathy.  Vitals reviewed.         Assessment & Plan:  Acute bronchitis due to other specified organisms - Plan: azithromycin (ZITHROMAX) 250 MG tablet I believe the patient has bacterial bronchitis. Begin a Z-Pak and recheck next week if no better. I will obtain a chest x-ray if no better. Recheck blood pressure next week.Marland Kitchen

## 2015-03-19 ENCOUNTER — Other Ambulatory Visit: Payer: Self-pay | Admitting: Family Medicine

## 2015-03-19 NOTE — Telephone Encounter (Signed)
Refill appropriate and filled per protocol. 

## 2015-04-16 ENCOUNTER — Encounter: Payer: Self-pay | Admitting: Family Medicine

## 2015-04-16 ENCOUNTER — Ambulatory Visit (INDEPENDENT_AMBULATORY_CARE_PROVIDER_SITE_OTHER): Payer: Medicare Other | Admitting: Family Medicine

## 2015-04-16 VITALS — BP 120/70 | HR 86 | Temp 98.1°F | Resp 16 | Ht 69.0 in | Wt 245.0 lb

## 2015-04-16 DIAGNOSIS — I10 Essential (primary) hypertension: Secondary | ICD-10-CM | POA: Diagnosis not present

## 2015-04-16 DIAGNOSIS — L821 Other seborrheic keratosis: Secondary | ICD-10-CM | POA: Diagnosis not present

## 2015-04-16 DIAGNOSIS — D485 Neoplasm of uncertain behavior of skin: Secondary | ICD-10-CM | POA: Diagnosis not present

## 2015-04-16 DIAGNOSIS — Z23 Encounter for immunization: Secondary | ICD-10-CM | POA: Diagnosis not present

## 2015-04-16 NOTE — Progress Notes (Signed)
Subjective:    Patient ID: Mario [name], male    DOB: 1946/01/24, 69 y.o.   MRN: [id number]  HPI Patient's blood pressure is much better when compared to his last office visit. He is here today requesting removal of several lesions. There are 4 lesions on his upper back. One is 2.5 cm in diameter. It is over his left scapula. One is less than 5 mm in diameter. One is 6 mm in diameter. One is 1.5 cm in diameter. Each of these lesions are warty brown papules consistent with seborrheic keratoses. He also has an erythematous papule on his medial right bicep. 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 There is also 1.5 cm in diameter erythematous warty papule on his right flank/love handle.  This appears to be either a keloid or an inflamed seborrheic keratoses or a wart Past Medical History  Diagnosis Date  . Obstructive sleep apnea   . Rhinitis   . Biliary colic 0626  . Asthma   . Hyperlipidemia   . Hypertension   . Sleep apnea     cpap   Past Surgical History  Procedure Laterality Date  . Lithotripsy  2000    for kidney stone   Current Outpatient Prescriptions on File Prior to Visit  Medication Sig Dispense Refill  . albuterol (PROVENTIL HFA;VENTOLIN HFA) 108 (90 BASE) MCG/ACT inhaler Inhale 2 puffs into the lungs every 6 (six) hours as needed for wheezing. 1 Inhaler 0  . aspirin 325 MG tablet Take 81 mg by mouth daily.     Marland Kitchen atorvastatin (LIPITOR) 40 MG tablet TAKE 1 TABLET BY MOUTH EVERY DAY 90 tablet 3  . budesonide-formoterol (SYMBICORT) 160-4.5 MCG/ACT inhaler INHALE 2 PUFFS INTO THE LUNGS 2 (TWO) TIMES DAILY. 1 Inhaler 5  . Cholecalciferol (VITAMIN D PO) Take 5,000 Units by mouth.     . Coenzyme Q10 (COQ10) 100 MG CAPS Take by mouth daily.    Marland Kitchen FIBER PO Take by mouth.    . fish oil-omega-3 fatty acids 1000 MG capsule Take 1 g by mouth daily.     . fluticasone (FLONASE) 50 MCG/ACT nasal spray SPRAY TWICE INTO THE NOSE DAILY. 16 g 1  .  hydrochlorothiazide (HYDRODIURIL) 25 MG tablet Take 1 tablet (25 mg total) by mouth daily. 90 tablet 3  . Melatonin 3 MG CAPS Take by mouth at bedtime.    . montelukast (SINGULAIR) 10 MG tablet TAKE 1 TABLET BY MOUTH EVERY DAY 90 tablet 3  . Probiotic Product (PROBIOTIC DAILY PO) Take by mouth daily.    . Zinc 50 MG CAPS Take by mouth.     No current facility-administered medications on file prior to visit.   No Known Allergies History   Social History  . Marital Status: Married    Spouse Name: N/A  . Number of Children: N/A  . Years of Education: N/A   Occupational History  . Not on file.   Social History Main Topics  . Smoking status: Former Smoker    Start date: 01/08/1975  . Smokeless tobacco: Never Used  . Alcohol Use: Yes     Comment: 1 glass wine a month  . Drug Use: No  . Sexual Activity: Not on file   Other Topics Concern  . Not on file   Social History Narrative     Review of Systems  All other systems reviewed and are negative.      Objective:   Physical  Exam  Constitutional: He appears well-developed and well-nourished.  Cardiovascular: Normal rate, regular rhythm and normal heart sounds.  Exam reveals no gallop and no friction rub.   No murmur heard. Pulmonary/Chest: Effort normal and breath sounds normal. No respiratory distress. He has no wheezes. He has no rales.  Abdominal: Soft. Bowel sounds are normal. He exhibits no distension. There is no tenderness. There is no rebound and no guarding.  Musculoskeletal: He exhibits no edema.  Skin: Rash noted. There is erythema.  Vitals reviewed.         Assessment & Plan:  Neoplasm of uncertain behavior of skin  Benign essential HTN  Seborrheic keratoses  Blood pressure is now well controlled. I will not change his blood pressure medications at all. Each of the 4 seborrheic keratoses was anesthetized with 0.1% lidocaine with epinephrine. Each of the 4 seborrheic keratoses was then removed with a  shave biopsy. Hemostasis was achieved using Drysol, Polysporin, and a Band-Aid. There were 4 shave biopsies in total. I then turned my attention to the 2 erythematous warty papules on his right flank and also on his medial right bicep. Each of these was treated with cryotherapy using liquid nitrogen for a total of 30 seconds each.

## 2015-04-16 NOTE — Addendum Note (Signed)
Addended by: Shary Decamp B on: 04/16/2015 09:15 AM   Modules accepted: Orders

## 2015-04-30 ENCOUNTER — Other Ambulatory Visit: Payer: Self-pay | Admitting: Family Medicine

## 2015-04-30 NOTE — Telephone Encounter (Signed)
Refill appropriate and filled per protocol. 

## 2015-05-29 DIAGNOSIS — Z23 Encounter for immunization: Secondary | ICD-10-CM | POA: Diagnosis not present

## 2015-07-16 ENCOUNTER — Ambulatory Visit (INDEPENDENT_AMBULATORY_CARE_PROVIDER_SITE_OTHER): Payer: Medicare Other | Admitting: Family Medicine

## 2015-07-16 ENCOUNTER — Encounter: Payer: Self-pay | Admitting: Family Medicine

## 2015-07-16 VITALS — BP 144/80 | HR 58 | Temp 98.2°F | Resp 16 | Ht 69.0 in | Wt 251.0 lb

## 2015-07-16 DIAGNOSIS — R600 Localized edema: Secondary | ICD-10-CM | POA: Diagnosis not present

## 2015-07-16 DIAGNOSIS — R0609 Other forms of dyspnea: Secondary | ICD-10-CM | POA: Diagnosis not present

## 2015-07-16 DIAGNOSIS — I10 Essential (primary) hypertension: Secondary | ICD-10-CM

## 2015-07-16 MED ORDER — LOSARTAN POTASSIUM-HCTZ 100-12.5 MG PO TABS: 1.0000 | ORAL_TABLET | Freq: Every day | ORAL | Status: DC

## 2015-07-16 MED ORDER — BUDESONIDE-FORMOTEROL FUMARATE 160-4.5 MCG/ACT IN AERO: INHALATION_SPRAY | RESPIRATORY_TRACT | Status: DC

## 2015-07-16 NOTE — Progress Notes (Signed)
Subjective:    Patient ID: Mario [name], male    DOB: 1946/01/12, 69 y.o.   MRN: [id number]  HPI  Patient has recently seen his blood pressure rise. His blood pressures typically ranging 140-150 over 80s. This is occurred since the patient discontinued Hyzaar and replace it only with hydrochlorothiazide. He also reports increasing dyspnea on exertion, he has pitting edema in both legs, he reports orthopnea. He denies any chest pain. Past medical history is significant for poorly controlled obstructive sleep apnea, morbid obesity, and asthma. Past Medical History  Diagnosis Date  . Obstructive sleep apnea   . Rhinitis   . Biliary colic 4128  . Asthma   . Hyperlipidemia   . Hypertension   . Sleep apnea     cpap   Past Surgical History  Procedure Laterality Date  . Lithotripsy  2000    for kidney stone   Current Outpatient Prescriptions on File Prior to Visit  Medication Sig Dispense Refill  . albuterol (PROVENTIL HFA;VENTOLIN HFA) 108 (90 BASE) MCG/ACT inhaler Inhale 2 puffs into the lungs every 6 (six) hours as needed for wheezing. 1 Inhaler 0  . aspirin 325 MG tablet Take 81 mg by mouth daily.     Marland Kitchen atorvastatin (LIPITOR) 40 MG tablet TAKE 1 TABLET BY MOUTH EVERY DAY 90 tablet 3  . budesonide-formoterol (SYMBICORT) 160-4.5 MCG/ACT inhaler INHALE 2 PUFFS INTO THE LUNGS 2 (TWO) TIMES DAILY. 1 Inhaler 5  . Cholecalciferol (VITAMIN D PO) Take 5,000 Units by mouth.     . Coenzyme Q10 (COQ10) 100 MG CAPS Take by mouth daily.    Marland Kitchen FIBER PO Take by mouth.    . fish oil-omega-3 fatty acids 1000 MG capsule Take 1 g by mouth daily.     . fluticasone (FLONASE) 50 MCG/ACT nasal spray SPRAY TWICE INTO THE NOSE DAILY. 16 g 1  . hydrochlorothiazide (HYDRODIURIL) 25 MG tablet TAKE 1 TABLET BY MOUTH EVERY DAY 90 tablet 2  . Melatonin 3 MG CAPS Take by mouth at bedtime.    . montelukast (SINGULAIR) 10 MG tablet TAKE 1 TABLET BY MOUTH EVERY DAY 90 tablet 3  . OVER THE COUNTER MEDICATION Natto BP  - 2 tabs po qd    . Probiotic Product (PROBIOTIC DAILY PO) Take by mouth daily.    . Zinc 50 MG CAPS Take by mouth.     No current facility-administered medications on file prior to visit.   No Known Allergies History   Social History  . Marital Status: Married    Spouse Name: N/A  . Number of Children: N/A  . Years of Education: N/A   Occupational History  . Not on file.   Social History Main Topics  . Smoking status: Former Smoker    Start date: 01/08/1975  . Smokeless tobacco: Never Used  . Alcohol Use: Yes     Comment: 1 glass wine a month  . Drug Use: No  . Sexual Activity: Not on file   Other Topics Concern  . Not on file   Social History Narrative      Review of Systems  All other systems reviewed and are negative.      Objective:   Physical Exam  Constitutional: He appears well-developed and well-nourished. No distress.  HENT:  Right Ear: External ear normal.  Left Ear: External ear normal.  Nose: Nose normal.  Mouth/Throat: Oropharynx is clear and moist.  Neck: Neck supple.  Cardiovascular: Normal rate, regular rhythm and normal heart  sounds.   Pulmonary/Chest: Effort normal and breath sounds normal. No respiratory distress. He has no wheezes. He has no rales.  Abdominal: Soft. Bowel sounds are normal. He exhibits no distension. There is no tenderness. There is no rebound and no guarding.  Musculoskeletal: He exhibits no edema.  Lymphadenopathy:    He has no cervical adenopathy.  Skin: He is not diaphoretic.  Vitals reviewed.         Assessment & Plan:  Benign essential HTN - Plan: losartan-hydrochlorothiazide (HYZAAR) 100-12.5 MG per tablet  Bilateral leg edema - Plan: Echocardiogram  Dyspnea on exertion - Plan: Echocardiogram  Blood pressure is not adequately controlled. I'll have the patient discontinue hydrochlorothiazide and replace it with Hyzaar 100/12.5 one by mouth daily. I will schedule the patient for an echocardiogram of the  heart to rule out congestive heart failure. However I anticipate the patient may have an element of cor pulmonale secondary to his asthma, his sleep apnea, and his morbid obesity. If this is confirmed on ultrasound of the heart, we will need to address these risk factors more aggressively

## 2015-07-24 ENCOUNTER — Ambulatory Visit (HOSPITAL_COMMUNITY): Payer: Medicare Other | Attending: Family Medicine

## 2015-07-24 ENCOUNTER — Other Ambulatory Visit: Payer: Self-pay

## 2015-07-24 DIAGNOSIS — Z6837 Body mass index (BMI) 37.0-37.9, adult: Secondary | ICD-10-CM | POA: Diagnosis not present

## 2015-07-24 DIAGNOSIS — E785 Hyperlipidemia, unspecified: Secondary | ICD-10-CM | POA: Insufficient documentation

## 2015-07-24 DIAGNOSIS — I10 Essential (primary) hypertension: Secondary | ICD-10-CM | POA: Insufficient documentation

## 2015-07-24 DIAGNOSIS — R600 Localized edema: Secondary | ICD-10-CM | POA: Diagnosis not present

## 2015-07-24 DIAGNOSIS — E669 Obesity, unspecified: Secondary | ICD-10-CM | POA: Insufficient documentation

## 2015-07-24 DIAGNOSIS — Z87891 Personal history of nicotine dependence: Secondary | ICD-10-CM | POA: Insufficient documentation

## 2015-07-24 DIAGNOSIS — R0609 Other forms of dyspnea: Secondary | ICD-10-CM | POA: Insufficient documentation

## 2015-08-05 ENCOUNTER — Telehealth: Payer: Self-pay | Admitting: Family Medicine

## 2015-08-05 NOTE — Telephone Encounter (Signed)
Pt called requesting an order be sent to [facility] for bipap mask as his is unable to tolerate the cpap due to his asthma. Order was faxed on 07/27/15. On 07/30/15 received message from [facility] that pt would need another sleep study to titrate for the BiPap as his last sleep study did not titrate for bipap just cpap. Pt was informed of this and states that the cpap makes his asthma worse. Told pt he still would need another sleep study for titration purposes.

## 2015-08-07 ENCOUNTER — Telehealth: Payer: Self-pay | Admitting: Family Medicine

## 2015-08-07 NOTE — Telephone Encounter (Signed)
Patient would like a call back regarding questions about his cpap machine  [phone]

## 2015-08-11 NOTE — Telephone Encounter (Signed)
Spoke to pt and he needs an order faxed to [facility] to adjust/decrease his pressure in his CPAP to make it more tolerable. We do not have a sleep study that states he needs a decrease in his pressure and I asked the patient what should the order state as we do not have a sleep study that states what his pressures should be. He stated to fax and order to adjust pressure to a satisfactory tolerable pressure.  Will fax order as requested.

## 2015-08-11 NOTE — Telephone Encounter (Signed)
Orders faxed with confirmation.

## 2015-09-02 DIAGNOSIS — G4733 Obstructive sleep apnea (adult) (pediatric): Secondary | ICD-10-CM | POA: Diagnosis not present

## 2016-01-30 ENCOUNTER — Other Ambulatory Visit: Payer: Self-pay | Admitting: Family Medicine

## 2016-02-10 ENCOUNTER — Other Ambulatory Visit: Payer: Self-pay | Admitting: Family Medicine

## 2016-02-18 ENCOUNTER — Ambulatory Visit (INDEPENDENT_AMBULATORY_CARE_PROVIDER_SITE_OTHER): Payer: Medicare Other | Admitting: Family Medicine

## 2016-02-18 ENCOUNTER — Encounter: Payer: Self-pay | Admitting: Family Medicine

## 2016-02-18 VITALS — BP 100/60 | HR 50 | Temp 97.7°F | Resp 16 | Wt 233.0 lb

## 2016-02-18 DIAGNOSIS — J208 Acute bronchitis due to other specified organisms: Secondary | ICD-10-CM

## 2016-02-18 DIAGNOSIS — I10 Essential (primary) hypertension: Secondary | ICD-10-CM

## 2016-02-18 DIAGNOSIS — E785 Hyperlipidemia, unspecified: Secondary | ICD-10-CM | POA: Diagnosis not present

## 2016-02-18 LAB — COMPLETE METABOLIC PANEL WITH GFR
ALT: 24 U/L (ref 9–46)
AST: 23 U/L (ref 10–35)
Albumin: 3.7 g/dL (ref 3.6–5.1)
Alkaline Phosphatase: 44 U/L (ref 40–115)
BILIRUBIN TOTAL: 0.9 mg/dL (ref 0.2–1.2)
BUN: 23 mg/dL (ref 7–25)
CHLORIDE: 103 mmol/L (ref 98–110)
CO2: 28 mmol/L (ref 20–31)
CREATININE: 1.05 mg/dL (ref 0.70–1.18)
Calcium: 9.4 mg/dL (ref 8.6–10.3)
GFR, EST AFRICAN AMERICAN: 83 mL/min (ref >=60)
GFR, Est Non African American: 72 mL/min (ref >=60)
GLUCOSE: 95 mg/dL (ref 70–99)
Potassium: 4.2 mmol/L (ref 3.5–5.3)
SODIUM: 140 mmol/L (ref 135–146)
Total Protein: 6.5 g/dL (ref 6.1–8.1)

## 2016-02-18 LAB — CBC WITH DIFFERENTIAL/PLATELET
BASOS PCT: 0 % (ref 0–1)
Basophils Absolute: 0 10*3/uL (ref 0.0–0.1)
EOS ABS: 0.4 10*3/uL (ref 0.0–0.7)
Eosinophils Relative: 4 % (ref 0–5)
HCT: 44 % (ref 39.0–52.0)
Hemoglobin: 14.4 g/dL (ref 13.0–17.0)
Lymphocytes Relative: 19 % (ref 12–46)
Lymphs Abs: 2 10*3/uL (ref 0.7–4.0)
MCH: 29.7 pg (ref 26.0–34.0)
MCHC: 32.7 g/dL (ref 30.0–36.0)
MCV: 90.7 fL (ref 78.0–100.0)
MONO ABS: 1 10*3/uL (ref 0.1–1.0)
MONOS PCT: 9 % (ref 3–12)
MPV: 10 fL (ref 8.6–12.4)
NEUTROS ABS: 7.3 10*3/uL (ref 1.7–7.7)
NEUTROS PCT: 68 % (ref 43–77)
PLATELETS: 281 10*3/uL (ref 150–400)
RBC: 4.85 MIL/uL (ref 4.22–5.81)
RDW: 14.1 % (ref 11.5–15.5)
WBC: 10.7 10*3/uL — AB (ref 4.0–10.5)

## 2016-02-18 LAB — LIPID PANEL
Cholesterol: 153 mg/dL (ref 125–200)
HDL: 60 mg/dL (ref >=40)
LDL CALC: 78 mg/dL (ref <130)
TRIGLYCERIDES: 77 mg/dL (ref <150)
Total CHOL/HDL Ratio: 2.6 Ratio (ref <=5.0)
VLDL: 15 mg/dL (ref <30)

## 2016-02-18 MED ORDER — PREDNISONE 20 MG PO TABS: ORAL_TABLET | ORAL | Status: DC

## 2016-02-18 MED ORDER — LEVOFLOXACIN 500 MG PO TABS
500.0000 mg | ORAL_TABLET | Freq: Every day | ORAL | Status: DC
Start: 2016-02-18 — End: 2016-07-07

## 2016-02-18 NOTE — Progress Notes (Signed)
Subjective:    Patient ID: Mario [name], male    DOB: 1946-10-10, 70 y.o.   MRN: [id number]  HPI   He has a long-standing history of asthma. He is actually done quite well since the last time I saw him. His last case of bronchitis was more than 8 months ago. However he went on a cruise and became ill on the cruise. He has been sick and coughing for more than 3 weeks. He states the cough is productive. This morning was productive of yellow mucus. He also reports some chest tightness and wheezing that fluctuates typical to his asthma. He is requesting prednisone as well as an antibiotic. He denies any fevers or chills. He denies any hemoptysis. He denies any pleurisy. However the cough seems to be worsening even after 3 weeks. His companions on the trip also sick however they have all recovered at this point.  He also has a history of hypertension and hyperlipidemia. His blood pressure is excellent. He is walking 3 miles a day. He denies any myalgias or right upper quadrant pain. He is due for fasting lab work Past Medical History  Diagnosis Date  . Obstructive sleep apnea   . Rhinitis   . Biliary colic 123456  . Asthma   . Hyperlipidemia   . Hypertension   . Sleep apnea     cpap   Past Surgical History  Procedure Laterality Date  . Lithotripsy  2000    for kidney stone   Current Outpatient Prescriptions on File Prior to Visit  Medication Sig Dispense Refill  . albuterol (PROVENTIL HFA;VENTOLIN HFA) 108 (90 BASE) MCG/ACT inhaler Inhale 2 puffs into the lungs every 6 (six) hours as needed for wheezing. 1 Inhaler 0  . aspirin 325 MG tablet Take 81 mg by mouth daily.     Marland Kitchen atorvastatin (LIPITOR) 40 MG tablet TAKE 1 TABLET BY MOUTH EVERY DAY 90 tablet 3  . Cholecalciferol (VITAMIN D PO) Take 5,000 Units by mouth.     . Coenzyme Q10 (COQ10) 100 MG CAPS Take by mouth daily.    Marland Kitchen FIBER PO Take by mouth.    . fish oil-omega-3 fatty acids 1000 MG capsule Take 1 g by mouth daily.     .  fluticasone (FLONASE) 50 MCG/ACT nasal spray SPRAY TWICE INTO THE NOSE DAILY. 16 g 1  . hydrochlorothiazide (HYDRODIURIL) 25 MG tablet TAKE 1 TABLET BY MOUTH EVERY DAY 90 tablet 2  . losartan-hydrochlorothiazide (HYZAAR) 100-12.5 MG per tablet Take 1 tablet by mouth daily. 90 tablet 3  . Melatonin 3 MG CAPS Take by mouth at bedtime.    . montelukast (SINGULAIR) 10 MG tablet TAKE 1 TABLET BY MOUTH EVERY DAY 90 tablet 3  . OVER THE COUNTER MEDICATION Natto BP - 2 tabs po qd    . Probiotic Product (PROBIOTIC DAILY PO) Take by mouth daily.    . SYMBICORT 160-4.5 MCG/ACT inhaler INHALE 2 PUFFS INTO THE LUNGS 2 (TWO) TIMES DAILY. 10.2 Inhaler 5  . Zinc 50 MG CAPS Take by mouth.     No current facility-administered medications on file prior to visit.   No Known Allergies Social History   Social History  . Marital Status: Married    Spouse Name: N/A  . Number of Children: N/A  . Years of Education: N/A   Occupational History  . Not on file.   Social History Main Topics  . Smoking status: Former Smoker    Start date: 01/08/1975  .  Smokeless tobacco: Never Used  . Alcohol Use: Yes     Comment: 1 glass wine a month  . Drug Use: No  . Sexual Activity: Not on file   Other Topics Concern  . Not on file   Social History Narrative      Review of Systems  All other systems reviewed and are negative.      Objective:   Physical Exam  Constitutional: He appears well-developed and well-nourished. No distress.  HENT:  Right Ear: External ear normal.  Left Ear: External ear normal.  Nose: Nose normal.  Mouth/Throat: Oropharynx is clear and moist.  Neck: Neck supple.  Cardiovascular: Normal rate, regular rhythm and normal heart sounds.   Pulmonary/Chest: Effort normal and breath sounds normal. No respiratory distress. He has no wheezes. He has no rales.  Abdominal: Soft. Bowel sounds are normal. He exhibits no distension. There is no tenderness. There is no rebound and no guarding.    Musculoskeletal: He exhibits no edema.  Lymphadenopathy:    He has no cervical adenopathy.  Skin: He is not diaphoretic.  Vitals reviewed.         Assessment & Plan:  Benign essential HTN - Plan: COMPLETE METABOLIC PANEL WITH GFR, Lipid panel, CBC with Differential/Platelet  Acute bronchitis due to other specified organisms - Plan: levofloxacin (LEVAQUIN) 500 MG tablet, predniSONE (DELTASONE) 20 MG tablet  HLD (hyperlipidemia) - Plan: COMPLETE METABOLIC PANEL WITH GFR, Lipid panel, CBC with Differential/Platelet  Blood pressure is excellent. I will make no changes in his blood pressure medication. I will check a fasting lipid panel. His goal LDL cholesterol is less than 130. I will treat his bronchitis with Levaquin 500 mg daily for 7 days and because of the wheezing that he is experiencing at home and his history of asthma I will put him on a prednisone taper pack as he typically requires this to clear his asthma.

## 2016-03-15 DIAGNOSIS — G4733 Obstructive sleep apnea (adult) (pediatric): Secondary | ICD-10-CM | POA: Diagnosis not present

## 2016-03-20 ENCOUNTER — Other Ambulatory Visit: Payer: Self-pay | Admitting: Family Medicine

## 2016-04-16 DIAGNOSIS — H5202 Hypermetropia, left eye: Secondary | ICD-10-CM | POA: Diagnosis not present

## 2016-04-16 DIAGNOSIS — H524 Presbyopia: Secondary | ICD-10-CM | POA: Diagnosis not present

## 2016-04-16 DIAGNOSIS — H52223 Regular astigmatism, bilateral: Secondary | ICD-10-CM | POA: Diagnosis not present

## 2016-04-16 DIAGNOSIS — H2513 Age-related nuclear cataract, bilateral: Secondary | ICD-10-CM | POA: Diagnosis not present

## 2016-06-16 DIAGNOSIS — I10 Essential (primary) hypertension: Secondary | ICD-10-CM | POA: Diagnosis not present

## 2016-06-16 DIAGNOSIS — J9811 Atelectasis: Secondary | ICD-10-CM | POA: Diagnosis not present

## 2016-06-16 DIAGNOSIS — R05 Cough: Secondary | ICD-10-CM | POA: Diagnosis not present

## 2016-07-07 ENCOUNTER — Ambulatory Visit (INDEPENDENT_AMBULATORY_CARE_PROVIDER_SITE_OTHER): Payer: Medicare Other | Admitting: Family Medicine

## 2016-07-07 ENCOUNTER — Encounter: Payer: Self-pay | Admitting: Family Medicine

## 2016-07-07 VITALS — BP 142/76 | HR 60 | Temp 97.8°F | Resp 18 | Ht 69.0 in | Wt 237.0 lb

## 2016-07-07 DIAGNOSIS — M7551 Bursitis of right shoulder: Secondary | ICD-10-CM

## 2016-07-07 DIAGNOSIS — I10 Essential (primary) hypertension: Secondary | ICD-10-CM

## 2016-07-07 MED ORDER — SILDENAFIL CITRATE 100 MG PO TABS: 100.0000 mg | ORAL_TABLET | Freq: Every day | ORAL | Status: DC | PRN

## 2016-07-07 MED ORDER — BUDESONIDE-FORMOTEROL FUMARATE 160-4.5 MCG/ACT IN AERO: INHALATION_SPRAY | RESPIRATORY_TRACT | Status: DC

## 2016-07-07 MED ORDER — ATORVASTATIN CALCIUM 40 MG PO TABS: 40.0000 mg | ORAL_TABLET | Freq: Every day | ORAL | Status: DC

## 2016-07-07 MED ORDER — LOSARTAN POTASSIUM-HCTZ 100-12.5 MG PO TABS: 1.0000 | ORAL_TABLET | Freq: Every day | ORAL | Status: DC

## 2016-07-07 MED ORDER — MONTELUKAST SODIUM 10 MG PO TABS: 10.0000 mg | ORAL_TABLET | Freq: Every day | ORAL | Status: DC

## 2016-07-07 NOTE — Progress Notes (Signed)
Subjective:    Patient ID: Mario [name], male    DOB: 11/03/1946, 70 y.o.   MRN: [id number]  HPI Patient presents with 2 months of gradually worsening pain in his shoulder. Pain is worse with abduction greater than or 90. He has pain with internal and sternal rotation in the shoulder. He has a positive empty can sign. He has a positive Hawkins test. He has pain with subscapularis liftoff test. He has negative Yergason's test. He has negative speed's test. There is no palpable crepitus in the shoulder. Symptoms began gradually. They have worsened recently as he has been remodeling his house Past Medical History  Diagnosis Date  . Obstructive sleep apnea   . Rhinitis   . Biliary colic 123456  . Asthma   . Hyperlipidemia   . Hypertension   . Sleep apnea     cpap   Past Surgical History  Procedure Laterality Date  . Lithotripsy  2000    for kidney stone   Current Outpatient Prescriptions on File Prior to Visit  Medication Sig Dispense Refill  . albuterol (PROVENTIL HFA;VENTOLIN HFA) 108 (90 BASE) MCG/ACT inhaler Inhale 2 puffs into the lungs every 6 (six) hours as needed for wheezing. 1 Inhaler 0  . aspirin 325 MG tablet Take 81 mg by mouth daily.     Marland Kitchen atorvastatin (LIPITOR) 40 MG tablet TAKE 1 TABLET BY MOUTH EVERY DAY 90 tablet 3  . Cholecalciferol (VITAMIN D PO) Take 5,000 Units by mouth.     . Coenzyme Q10 (COQ10) 100 MG CAPS Take by mouth daily.    Marland Kitchen FIBER PO Take by mouth.    . fish oil-omega-3 fatty acids 1000 MG capsule Take 1 g by mouth daily.     . fluticasone (FLONASE) 50 MCG/ACT nasal spray SPRAY TWICE INTO THE NOSE DAILY. 16 g 1  . losartan-hydrochlorothiazide (HYZAAR) 100-12.5 MG per tablet Take 1 tablet by mouth daily. 90 tablet 3  . Melatonin 3 MG CAPS Take by mouth at bedtime.    . montelukast (SINGULAIR) 10 MG tablet TAKE 1 TABLET BY MOUTH EVERY DAY 90 tablet 3  . OVER THE COUNTER MEDICATION Natto BP - 2 tabs po qd    . Probiotic Product (PROBIOTIC DAILY PO)  Take by mouth daily.    . SYMBICORT 160-4.5 MCG/ACT inhaler INHALE 2 PUFFS INTO THE LUNGS 2 (TWO) TIMES DAILY. 10.2 Inhaler 5  . Zinc 50 MG CAPS Take by mouth.     No current facility-administered medications on file prior to visit.   No Known Allergies Social History   Social History  . Marital Status: Married    Spouse Name: N/A  . Number of Children: N/A  . Years of Education: N/A   Occupational History  . Not on file.   Social History Main Topics  . Smoking status: Former Smoker    Start date: 01/08/1975  . Smokeless tobacco: Never Used  . Alcohol Use: Yes     Comment: 1 glass wine a month  . Drug Use: No  . Sexual Activity: Not on file   Other Topics Concern  . Not on file   Social History Narrative      Review of Systems  All other systems reviewed and are negative.      Objective:   Physical Exam  Constitutional: He appears well-developed and well-nourished. No distress.  Cardiovascular: Normal rate, regular rhythm and normal heart sounds.   Pulmonary/Chest: Effort normal and breath sounds normal.  Musculoskeletal: He  exhibits tenderness.       Right shoulder: He exhibits decreased range of motion, tenderness and pain. He exhibits no spasm and normal strength.  Skin: He is not diaphoretic.  Vitals reviewed.         Assessment & Plan:  Subacromial bursitis, right  Patient has subacromial bursitis as well as some rotator cuff tendinitis. Using sterile technique, I injected the right shoulder with 2 mL of lidocaine, 2 mL of Marcaine, and 2 mL of 40 mg per mL Kenalog. Patient tolerated the procedure well with no complication. Recheck immediately if no better in 2 weeks

## 2016-07-07 NOTE — Addendum Note (Signed)
Addended by: Shary Decamp B on: 07/07/2016 12:05 PM   Modules accepted: Orders

## 2016-08-02 ENCOUNTER — Other Ambulatory Visit: Payer: Self-pay | Admitting: Family Medicine

## 2016-08-02 DIAGNOSIS — I10 Essential (primary) hypertension: Secondary | ICD-10-CM

## 2016-09-06 DIAGNOSIS — G4733 Obstructive sleep apnea (adult) (pediatric): Secondary | ICD-10-CM | POA: Diagnosis not present

## 2017-01-18 ENCOUNTER — Encounter: Payer: Self-pay | Admitting: Family Medicine

## 2017-01-18 ENCOUNTER — Ambulatory Visit (INDEPENDENT_AMBULATORY_CARE_PROVIDER_SITE_OTHER): Payer: Medicare Other | Admitting: Family Medicine

## 2017-01-18 VITALS — BP 120/70 | HR 60 | Temp 98.0°F | Resp 16 | Ht 69.0 in | Wt 240.0 lb

## 2017-01-18 DIAGNOSIS — J181 Lobar pneumonia, unspecified organism: Secondary | ICD-10-CM | POA: Diagnosis not present

## 2017-01-18 DIAGNOSIS — J189 Pneumonia, unspecified organism: Secondary | ICD-10-CM

## 2017-01-18 MED ORDER — LEVOFLOXACIN 500 MG PO TABS: 500.0000 mg | ORAL_TABLET | Freq: Every day | ORAL | 0 refills | Status: DC

## 2017-01-18 MED ORDER — PREDNISONE 20 MG PO TABS: ORAL_TABLET | ORAL | 0 refills | Status: DC

## 2017-01-18 NOTE — Progress Notes (Signed)
Subjective:    Patient ID: Mario [name], male    DOB: Jul 06, 1946, 71 y.o.   MRN: [id number]  HPI Patient is a 71 year old white male with a history of asthma. Over the last 3 weeks he developed a worsening cough. The cough is productive of green sputum. He reports chest congestion, subjective fevers, night sweats, and chills. He also has some burning discomfort in his airways when he coughs and when he breathes deeply. He denies any angina. He denies any shortness of breath. He denies any wheezing.  Past Medical History:  Diagnosis Date  . Asthma   . Biliary colic 123456  . Hyperlipidemia   . Hypertension   . Obstructive sleep apnea   . Rhinitis   . Sleep apnea    cpap   Past Surgical History:  Procedure Laterality Date  . LITHOTRIPSY  2000   for kidney stone   Current Outpatient Prescriptions on File Prior to Visit  Medication Sig Dispense Refill  . albuterol (PROVENTIL HFA;VENTOLIN HFA) 108 (90 BASE) MCG/ACT inhaler Inhale 2 puffs into the lungs every 6 (six) hours as needed for wheezing. 1 Inhaler 0  . aspirin 325 MG tablet Take 81 mg by mouth daily.     Marland Kitchen atorvastatin (LIPITOR) 40 MG tablet Take 1 tablet (40 mg total) by mouth daily. 90 tablet 3  . budesonide-formoterol (SYMBICORT) 160-4.5 MCG/ACT inhaler INHALE 2 PUFFS INTO THE LUNGS 2 (TWO) TIMES DAILY. 3 Inhaler 3  . Cholecalciferol (VITAMIN D PO) Take 5,000 Units by mouth.     . Coenzyme Q10 (COQ10) 100 MG CAPS Take by mouth daily.    Marland Kitchen FIBER PO Take by mouth.    . fish oil-omega-3 fatty acids 1000 MG capsule Take 1 g by mouth daily.     . fluticasone (FLONASE) 50 MCG/ACT nasal spray SPRAY TWICE INTO THE NOSE DAILY. 16 g 1  . losartan-hydrochlorothiazide (HYZAAR) 100-12.5 MG tablet TAKE 1 TABLET BY MOUTH DAILY. 90 tablet 3  . Melatonin 3 MG CAPS Take by mouth at bedtime.    . montelukast (SINGULAIR) 10 MG tablet Take 1 tablet (10 mg total) by mouth daily. 90 tablet 3  . OVER THE COUNTER MEDICATION Natto BP - 2 tabs po  qd    . Probiotic Product (PROBIOTIC DAILY PO) Take by mouth daily.    . sildenafil (VIAGRA) 100 MG tablet Take 1 tablet (100 mg total) by mouth daily as needed. 30 tablet 3  . Zinc 50 MG CAPS Take by mouth.     No current facility-administered medications on file prior to visit.    No Known Allergies Social History   Social History  . Marital status: Married    Spouse name: N/A  . Number of children: N/A  . Years of education: N/A   Occupational History  . Not on file.   Social History Main Topics  . Smoking status: Former Smoker    Start date: 01/08/1975  . Smokeless tobacco: Never Used  . Alcohol use Yes     Comment: 1 glass wine a month  . Drug use: No  . Sexual activity: Not on file   Other Topics Concern  . Not on file   Social History Narrative  . No narrative on file      Review of Systems  All other systems reviewed and are negative.      Objective:   Physical Exam  Constitutional: He appears well-developed and well-nourished.  HENT:  Right Ear: External ear normal.  Left Ear: External ear normal.  Nose: Nose normal.  Mouth/Throat: Oropharynx is clear and moist. No oropharyngeal exudate.  Eyes: No scleral icterus.  Neck: Neck supple. No JVD present.  Cardiovascular: Normal rate, regular rhythm and normal heart sounds.   No murmur heard. Pulmonary/Chest: Effort normal. No respiratory distress. He has wheezes. He has rales in the right lower field. He exhibits no tenderness.  Lymphadenopathy:    He has no cervical adenopathy.  Vitals reviewed.         Assessment & Plan:  Community acquired pneumonia of right lower lobe of lung ([place]) - Plan: predniSONE (DELTASONE) 20 MG tablet, levofloxacin (LEVAQUIN) 500 MG tablet I'm concerned the patient has bronchitis which assessment including developed into right lower lobe pneumonia. Begin Levaquin 500 mg a day for 7 days along with a prednisone taper pack. Recheck the patient in one week or sooner if  worse. I'm concerned that due to the frequency of his bronchitis along with his asthma and his remote history of smoking that the patient may be developing COPD. I would like to perform pulmonary function test next week at his follow-up.

## 2017-01-26 ENCOUNTER — Ambulatory Visit (INDEPENDENT_AMBULATORY_CARE_PROVIDER_SITE_OTHER): Payer: Medicare Other | Admitting: Family Medicine

## 2017-01-26 VITALS — BP 110/68 | HR 66 | Temp 97.9°F | Resp 18 | Ht 69.0 in | Wt 235.0 lb

## 2017-01-26 DIAGNOSIS — J440 Chronic obstructive pulmonary disease with acute lower respiratory infection: Secondary | ICD-10-CM

## 2017-01-26 DIAGNOSIS — J208 Acute bronchitis due to other specified organisms: Secondary | ICD-10-CM | POA: Diagnosis not present

## 2017-01-26 DIAGNOSIS — I10 Essential (primary) hypertension: Secondary | ICD-10-CM | POA: Diagnosis not present

## 2017-01-26 DIAGNOSIS — J209 Acute bronchitis, unspecified: Secondary | ICD-10-CM

## 2017-01-26 MED ORDER — LOSARTAN POTASSIUM-HCTZ 100-12.5 MG PO TABS: 1.0000 | ORAL_TABLET | Freq: Every day | ORAL | 3 refills | Status: DC

## 2017-01-26 MED ORDER — MONTELUKAST SODIUM 10 MG PO TABS: 10.0000 mg | ORAL_TABLET | Freq: Every day | ORAL | 3 refills | Status: DC

## 2017-01-26 MED ORDER — ATORVASTATIN CALCIUM 40 MG PO TABS: 40.0000 mg | ORAL_TABLET | Freq: Every day | ORAL | 3 refills | Status: DC

## 2017-01-26 NOTE — Addendum Note (Signed)
Addended by: Shary Decamp B on: 01/26/2017 02:35 PM   Modules accepted: Orders

## 2017-01-26 NOTE — Progress Notes (Signed)
Subjective:    Patient ID: Mario [name], male    DOB: 10/10/1946, 71 y.o.   MRN: [id number]  HPI  01/18/17 Patient is a 71 year old white male with a history of asthma. Over the last 3 weeks he developed a worsening cough. The cough is productive of green sputum. He reports chest congestion, subjective fevers, night sweats, and chills. He also has some burning discomfort in his airways when he coughs and when he breathes deeply. He denies any angina. He denies any shortness of breath. He denies any wheezing. At that time, my plan was: I'm concerned the patient has bronchitis which assessment including developed into right lower lobe pneumonia. Begin Levaquin 500 mg a day for 7 days along with a prednisone taper pack. Recheck the patient in 71 one week or sooner if worse. I'm concerned that due to the frequency of his bronchitis along with his asthma and his remote history of smoking that the patient may be developing COPD. I would like to perform pulmonary function test next week at his follow-up.  01/26/17 Patient is no longer febrile. However he continues to have shortness of breath. He still wheezes. He still has chest congestion. On examination today, the patient has diminished airflow bilaterally with faint 4 wheezes. There are no rhonchi. There are no rails. However I believe that the patient would do poorly if we perform pulmonary function test. Past Medical History:  Diagnosis Date  . Asthma   . Biliary colic 123456  . Hyperlipidemia   . Hypertension   . Obstructive sleep apnea   . Rhinitis   . Sleep apnea    cpap   Past Surgical History:  Procedure Laterality Date  . LITHOTRIPSY  2000   for kidney stone   Current Outpatient Prescriptions on File Prior to Visit  Medication Sig Dispense Refill  . albuterol (PROVENTIL HFA;VENTOLIN HFA) 108 (90 BASE) MCG/ACT inhaler Inhale 2 puffs into the lungs every 6 (six) hours as needed for wheezing. 1 Inhaler 0  . aspirin 325 MG tablet Take 81  mg by mouth daily.     Marland Kitchen atorvastatin (LIPITOR) 40 MG tablet Take 1 tablet (40 mg total) by mouth daily. 90 tablet 3  . budesonide-formoterol (SYMBICORT) 160-4.5 MCG/ACT inhaler INHALE 2 PUFFS INTO THE LUNGS 2 (TWO) TIMES DAILY. 3 Inhaler 3  . Cholecalciferol (VITAMIN D PO) Take 5,000 Units by mouth.     . Coenzyme Q10 (COQ10) 100 MG CAPS Take by mouth daily.    Marland Kitchen FIBER PO Take by mouth.    . fish oil-omega-3 fatty acids 1000 MG capsule Take 1 g by mouth daily.     . fluticasone (FLONASE) 50 MCG/ACT nasal spray SPRAY TWICE INTO THE NOSE DAILY. 16 g 1  . losartan-hydrochlorothiazide (HYZAAR) 100-12.5 MG tablet TAKE 1 TABLET BY MOUTH DAILY. 90 tablet 3  . Melatonin 3 MG CAPS Take by mouth at bedtime.    . montelukast (SINGULAIR) 10 MG tablet Take 1 tablet (10 mg total) by mouth daily. 90 tablet 3  . OVER THE COUNTER MEDICATION Natto BP - 2 tabs po qd    . Probiotic Product (PROBIOTIC DAILY PO) Take by mouth daily.    . sildenafil (VIAGRA) 100 MG tablet Take 1 tablet (100 mg total) by mouth daily as needed. 30 tablet 3  . Zinc 50 MG CAPS Take by mouth.     No current facility-administered medications on file prior to visit.    No Known Allergies Social History   Social  History  . Marital status: Married    Spouse name: N/A  . Number of children: N/A  . Years of education: N/A   Occupational History  . Not on file.   Social History Main Topics  . Smoking status: Former Smoker    Start date: 01/08/1975  . Smokeless tobacco: Never Used  . Alcohol use Yes     Comment: 1 glass wine a month  . Drug use: No  . Sexual activity: Not on file   Other Topics Concern  . Not on file   Social History Narrative  . No narrative on file      Review of Systems  All other systems reviewed and are negative.      Objective:   Physical Exam  Constitutional: He appears well-developed and well-nourished.  HENT:  Right Ear: External ear normal.  Left Ear: External ear normal.  Nose: Nose  normal.  Mouth/Throat: Oropharynx is clear and moist. No oropharyngeal exudate.  Eyes: No scleral icterus.  Neck: Neck supple. No JVD present.  Cardiovascular: Normal rate, regular rhythm and normal heart sounds.   No murmur heard. Pulmonary/Chest: Effort normal. No respiratory distress. He has wheezes. He has no rales. He exhibits no tenderness.  Lymphadenopathy:    He has no cervical adenopathy.  Vitals reviewed.         Assessment & Plan:  Acute bronchitis due to other specified organisms  Acute bronchitis with COPD ([place]) Continue Symbicort but I will empirically start incruse one inhalation a day and recheck in 3 weeks. I would like to perform pulmonary function tests at that time. Recheck sooner if worsening. I suspect that the patient has COPD

## 2017-02-02 DIAGNOSIS — G4733 Obstructive sleep apnea (adult) (pediatric): Secondary | ICD-10-CM | POA: Diagnosis not present

## 2017-04-17 DIAGNOSIS — H01001 Unspecified blepharitis right upper eyelid: Secondary | ICD-10-CM | POA: Diagnosis not present

## 2017-05-10 DIAGNOSIS — G4733 Obstructive sleep apnea (adult) (pediatric): Secondary | ICD-10-CM | POA: Diagnosis not present

## 2017-06-30 ENCOUNTER — Ambulatory Visit (INDEPENDENT_AMBULATORY_CARE_PROVIDER_SITE_OTHER): Payer: Medicare Other | Admitting: Family Medicine

## 2017-06-30 ENCOUNTER — Encounter: Payer: Self-pay | Admitting: Family Medicine

## 2017-06-30 VITALS — BP 136/82 | HR 76 | Temp 98.2°F | Resp 18 | Ht 69.0 in | Wt 234.0 lb

## 2017-06-30 DIAGNOSIS — J441 Chronic obstructive pulmonary disease with (acute) exacerbation: Secondary | ICD-10-CM

## 2017-06-30 DIAGNOSIS — R053 Chronic cough: Secondary | ICD-10-CM

## 2017-06-30 DIAGNOSIS — R05 Cough: Secondary | ICD-10-CM

## 2017-06-30 MED ORDER — AZITHROMYCIN 250 MG PO TABS: ORAL_TABLET | ORAL | 0 refills | Status: DC

## 2017-06-30 MED ORDER — PREDNISONE 20 MG PO TABS: ORAL_TABLET | ORAL | 0 refills | Status: DC

## 2017-06-30 NOTE — Progress Notes (Signed)
Subjective:    Patient ID: Mario [name], male    DOB: 28-Jan-1946, 71 y.o.   MRN: [id number]  HPI   01/18/17 Patient is a 71 year old white male with a history of asthma. Over the last 3 weeks he developed a worsening cough. The cough is productive of green sputum. He reports chest congestion, subjective fevers, night sweats, and chills. He also has some burning discomfort in his airways when he coughs and when he breathes deeply. He denies any angina. He denies any shortness of breath. He denies any wheezing. At that time, my plan was: I'm concerned the patient has bronchitis which assessment including developed into right lower lobe pneumonia. Begin Levaquin 500 mg a day for 7 days along with a prednisone taper pack. Recheck the patient in one week or sooner if worse. I'm concerned that due to the frequency of his bronchitis along with his asthma and his remote history of smoking that the patient may be developing COPD. I would like to perform pulmonary function test next week at his follow-up.  01/26/17 Patient is no longer febrile. However he continues to have shortness of breath. He still wheezes. He still has chest congestion. On examination today, the patient has diminished airflow bilaterally with faint 4 wheezes. There are no rhonchi. There are no rails. However I believe that the patient would do poorly if we perform pulmonary function test.  At that time, my plan was: Continue Symbicort but I will empirically start incruse one inhalation a day and recheck in 3 weeks. I would like to perform pulmonary function tests at that time. Recheck sooner if worsening. I suspect that the patient has COPD  06/30/17 Patient could not tolerate incruse due to coughing and stopped it ad=fter 1 week.  Never followed up as planned. Continue to have chronic cough over the last 6 months. Over the last week, shortness of breath has increased. He reports daily sputum production which is usually clear however over  the last 5 days the sputum production has become green and more prevalent. He reports increasing dyspnea on exertion increasing cough and wheezing. He also reports pleurisy. Pulmonary function testing was performed today in office. FEV1 percentage was 56%. FEV1 was 1.68 L which is 55% of predicted. 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 Past Medical History:  Diagnosis Date  . Asthma   . Biliary colic 3235  . Hyperlipidemia   . Hypertension   . Obstructive sleep apnea   . Rhinitis   . Sleep apnea    cpap   Past Surgical History:  Procedure Laterality Date  . LITHOTRIPSY  2000   for kidney stone   Current Outpatient Prescriptions on File Prior to Visit  Medication Sig Dispense Refill  . aspirin 325 MG tablet Take 81 mg by mouth daily.     Marland Kitchen atorvastatin (LIPITOR) 40 MG tablet Take 1 tablet (40 mg total) by mouth daily. 90 tablet 3  . budesonide-formoterol (SYMBICORT) 160-4.5 MCG/ACT inhaler INHALE 2 PUFFS INTO THE LUNGS 2 (TWO) TIMES DAILY. 3 Inhaler 3  . Cholecalciferol (VITAMIN D PO) Take 5,000 Units by mouth.     . Coenzyme Q10 (COQ10) 100 MG CAPS Take by mouth daily.    Marland Kitchen FIBER PO Take by mouth.    . fish oil-omega-3 fatty acids 1000 MG capsule Take 1 g by mouth daily.     . Melatonin 3 MG CAPS Take by mouth at bedtime.    Marland Kitchen  OVER THE COUNTER MEDICATION Natto BP - 2 tabs po qd    . Probiotic Product (PROBIOTIC DAILY PO) Take by mouth daily.    . sildenafil (VIAGRA) 100 MG tablet Take 1 tablet (100 mg total) by mouth daily as needed. 30 tablet 3  . Zinc 50 MG CAPS Take by mouth.     No current facility-administered medications on file prior to visit.    No Known Allergies Social History   Social History  . Marital status: Married    Spouse name: N/A  . Number of children: N/A  . Years of education: N/A   Occupational History  . Not on file.   Social History Main Topics  . Smoking status: Former Smoker    Start date:  01/08/1975  . Smokeless tobacco: Never Used  . Alcohol use Yes     Comment: 1 glass wine a month  . Drug use: No  . Sexual activity: Not on file   Other Topics Concern  . Not on file   Social History Narrative  . No narrative on file      Review of Systems  All other systems reviewed and are negative.      Objective:   Physical Exam  Constitutional: He appears well-developed and well-nourished.  HENT:  Right Ear: External ear normal.  Left Ear: External ear normal.  Nose: Nose normal.  Mouth/Throat: Oropharynx is clear and moist. No oropharyngeal exudate.  Eyes: No scleral icterus.  Neck: Neck supple. No JVD present.  Cardiovascular: Normal rate, regular rhythm and normal heart sounds.   No murmur heard. Pulmonary/Chest: Effort normal. No respiratory distress. He has wheezes. He has no rales. He exhibits no tenderness.  Lymphadenopathy:    He has no cervical adenopathy.  Vitals reviewed.         Assessment & Plan:  COPD exacerbation ([place]) - Plan: CT Chest W Contrast  Chronic coughing - Plan: CT Chest W Contrast Based on pulmonary function tests, I believe the patient's chronic cough represents chronic bronchitis. I believe he is developed COPD. I believe he is also in a COPD exacerbation. I will treat the exacerbation with a prednisone taper pack in addition to Zithromax/Z-Pak. Schedule patient for a CAT scan of the chest given his 8 month history of coughing and shortness of breath. If Scan shows no other abnormalities other than COPD, I would optimize the patient's medication and add spiriva to symbicort.

## 2017-07-07 ENCOUNTER — Ambulatory Visit
Admission: RE | Admit: 2017-07-07 | Discharge: 2017-07-07 | Disposition: A | Discharge status: Home / Self Care | Payer: Medicare Other | Source: Ambulatory Visit | Attending: Family Medicine | Admitting: Family Medicine

## 2017-07-07 DIAGNOSIS — R05 Cough: Secondary | ICD-10-CM | POA: Diagnosis not present

## 2017-07-07 DIAGNOSIS — J441 Chronic obstructive pulmonary disease with (acute) exacerbation: Secondary | ICD-10-CM

## 2017-07-07 DIAGNOSIS — R053 Chronic cough: Secondary | ICD-10-CM

## 2017-07-07 MED ORDER — IOPAMIDOL (ISOVUE-300) INJECTION 61%
75.0000 mL | Freq: Once | INTRAVENOUS | Status: AC | PRN
  Administered 2017-07-07: 75 mL via INTRAVENOUS

## 2017-07-11 ENCOUNTER — Encounter: Payer: Self-pay | Admitting: Family Medicine

## 2017-07-11 DIAGNOSIS — J449 Chronic obstructive pulmonary disease, unspecified: Secondary | ICD-10-CM | POA: Insufficient documentation

## 2017-07-11 DIAGNOSIS — J4489 Other specified chronic obstructive pulmonary disease: Secondary | ICD-10-CM | POA: Insufficient documentation

## 2017-07-13 ENCOUNTER — Other Ambulatory Visit: Payer: Self-pay | Admitting: Family Medicine

## 2017-07-13 MED ORDER — TIOTROPIUM BROMIDE MONOHYDRATE 2.5 MCG/ACT IN AERS: 2.0000 | INHALATION_SPRAY | Freq: Every day | RESPIRATORY_TRACT | 5 refills | Status: DC

## 2017-07-19 ENCOUNTER — Other Ambulatory Visit: Payer: Self-pay | Admitting: Family Medicine

## 2017-08-06 ENCOUNTER — Other Ambulatory Visit: Payer: Self-pay | Admitting: Family Medicine

## 2017-08-24 ENCOUNTER — Ambulatory Visit (INDEPENDENT_AMBULATORY_CARE_PROVIDER_SITE_OTHER): Payer: Medicare Other

## 2017-08-24 ENCOUNTER — Ambulatory Visit (INDEPENDENT_AMBULATORY_CARE_PROVIDER_SITE_OTHER): Payer: Medicare Other | Admitting: Podiatry

## 2017-08-24 ENCOUNTER — Encounter: Payer: Self-pay | Admitting: Podiatry

## 2017-08-24 VITALS — BP 184/103 | HR 53 | Resp 16

## 2017-08-24 DIAGNOSIS — M722 Plantar fascial fibromatosis: Secondary | ICD-10-CM

## 2017-08-24 MED ORDER — TRIAMCINOLONE ACETONIDE 10 MG/ML IJ SUSP
10.0000 mg | Freq: Once | INTRAMUSCULAR | Status: AC
  Administered 2017-08-24: 10 mg

## 2017-08-24 NOTE — Progress Notes (Signed)
   Subjective:    Patient ID: Mario [name], male    DOB: 1946-08-13, 71 y.o.   MRN: [id number]  HPI Chief Complaint  Patient presents with  . Foot Pain    Left foot; bottom of heel; pt stated, "Pain started last Monday, August 14, 2017; foot started to hurt after worked out"  . Nail Problem    Bilateral; nail discoloration & thickened nails; pt stated, "Wants all nails checked for nail fungus"      Review of Systems  Constitutional: Positive for appetite change.  HENT: Positive for sinus pain.   Respiratory: Positive for cough and shortness of breath.   Cardiovascular: Positive for leg swelling.  Genitourinary: Positive for frequency and urgency.  Musculoskeletal: Positive for arthralgias and gait problem.  Hematological: Bruises/bleeds easily.  All other systems reviewed and are negative.      Objective:   Physical Exam        Assessment & Plan:

## 2017-08-24 NOTE — Progress Notes (Signed)
Subjective:    Patient ID: Mario [name], male   DOB: 71 y.o.   MRN: [id number]   HPI patient presents with intense discomfort plantar aspect left heel at the insertional point of the tendon into the calcaneus    Review of Systems  All other systems reviewed and are negative.       Objective:  Physical Exam  Constitutional: He appears well-developed and well-nourished.  Cardiovascular: Intact distal pulses.   Pulmonary/Chest: Effort normal.  Musculoskeletal: Normal range of motion.  Neurological: He is alert.  Skin: Skin is warm.  Nursing note and vitals reviewed.  neurovascular status intact muscle strength adequate range of motion within normal limits with patient found to have exquisite discomfort plantar aspect left heel at the insertional point tendon into the calcaneus with inflammation and fluid around the medial band. Noted to have good digital perfusion and well oriented     Assessment:   Acute plantar fasciitis left at the insertional point of the tendon into the calcaneus     Plan:    H&P condition reviewed and injected the plantar fascial left 3 mg Kenalog 5 mill grams Xylocaine and applied fascial brace to reduce pressure. Instructed on physical therapy supportive shoes and reappoint to recheck  X-rays indicate small spur with no indication of stress fracture calcaneus

## 2017-08-24 NOTE — Patient Instructions (Signed)

## 2017-09-04 ENCOUNTER — Other Ambulatory Visit: Payer: Self-pay | Admitting: Family Medicine

## 2017-09-04 NOTE — Telephone Encounter (Signed)
Medication refilled per protocol. 

## 2017-09-07 ENCOUNTER — Ambulatory Visit (INDEPENDENT_AMBULATORY_CARE_PROVIDER_SITE_OTHER): Payer: Medicare Other | Admitting: Podiatry

## 2017-09-07 ENCOUNTER — Encounter: Payer: Self-pay | Admitting: Podiatry

## 2017-09-07 DIAGNOSIS — M722 Plantar fascial fibromatosis: Secondary | ICD-10-CM

## 2017-09-07 DIAGNOSIS — B351 Tinea unguium: Secondary | ICD-10-CM

## 2017-09-07 MED ORDER — TRIAMCINOLONE ACETONIDE 10 MG/ML IJ SUSP
10.0000 mg | Freq: Once | INTRAMUSCULAR | Status: AC
  Administered 2017-09-07: 10 mg

## 2017-09-07 NOTE — Progress Notes (Signed)
Subjective:    Patient ID: Mario [name], male   DOB: 71 y.o.   MRN: [id number]   HPI patient states he is still having a lot of pain in his left heel and he is due to go to [place] in about the next 6 weeks and is concerned. Also was concerned about discoloration in his hallux nails bilateral and wants to know what we can do about    ROS      Objective:  Physical Exam neurovascular status intact negative Homans sign was noted with patient found to have continued exquisite discomfort left plantar heel with minimal response to the injection we did. 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 I then went ahead and I did dispense air fracture walker to completely immobilize the plantar heel and instructed on usage

## 2017-09-14 ENCOUNTER — Ambulatory Visit: Payer: Medicare Other | Admitting: Podiatry

## 2017-09-28 ENCOUNTER — Encounter: Payer: Self-pay | Admitting: Podiatry

## 2017-09-28 ENCOUNTER — Ambulatory Visit (INDEPENDENT_AMBULATORY_CARE_PROVIDER_SITE_OTHER): Payer: Medicare Other | Admitting: Podiatry

## 2017-09-28 DIAGNOSIS — M722 Plantar fascial fibromatosis: Secondary | ICD-10-CM

## 2017-09-28 MED ORDER — TRIAMCINOLONE ACETONIDE 10 MG/ML IJ SUSP
10.0000 mg | Freq: Once | INTRAMUSCULAR | Status: AC
  Administered 2017-09-28: 10 mg

## 2017-09-28 NOTE — Progress Notes (Signed)
Subjective:    Patient ID: Mario [name], male   DOB: 71 y.o.   MRN: [id number]   HPI patient states that it feels better but I'm still feeling pain when I push off my left heel    ROS      Objective:  Physical Exam neurovascular status intact with pain now more in the center lateral aspect of the plantar fascial insertion calcaneus     Assessment:    Improve medial band of the left fascia with pain in the central lateral band of the fascia     Plan:    I injected from the lateral side 3 mg Kenalog 5 mg Xylocaine into the fascia to reduce inflammation and advised on continued boot usage with gradual reduction over the next 4 weeks

## 2017-10-12 ENCOUNTER — Ambulatory Visit (INDEPENDENT_AMBULATORY_CARE_PROVIDER_SITE_OTHER): Payer: Medicare Other | Admitting: Podiatry

## 2017-10-12 ENCOUNTER — Encounter: Payer: Self-pay | Admitting: Podiatry

## 2017-10-12 DIAGNOSIS — M722 Plantar fascial fibromatosis: Secondary | ICD-10-CM

## 2017-10-12 MED ORDER — DICLOFENAC SODIUM 75 MG PO TBEC: 75.0000 mg | DELAYED_RELEASE_TABLET | Freq: Two times a day (BID) | ORAL | 2 refills | Status: DC

## 2017-10-12 NOTE — Progress Notes (Signed)
Subjective:    Patient ID: Mario [name], male   DOB: 71 y.o.   MRN: [id number]   HPI patient presents stating he still having pain in his left heel and is concerned because going to [place]    ROS      Objective:  Physical Exam neurovascular status intact with continued inflammation pain of the left medial and central band of the plantar fascia     Assessment:   Plantar fasciitis left still noted      Plan:    Advised on boot usage to try to immobilize along with anti-inflammatories diclofenac 75 mg twice a day and dispensed night splint which she will use with aggressive ice therapy. Patient be seen back and may have injection done prior to his trip

## 2017-10-19 ENCOUNTER — Ambulatory Visit (INDEPENDENT_AMBULATORY_CARE_PROVIDER_SITE_OTHER): Payer: Medicare Other | Admitting: Family Medicine

## 2017-10-19 ENCOUNTER — Encounter: Payer: Self-pay | Admitting: Family Medicine

## 2017-10-19 VITALS — BP 146/80 | HR 86 | Temp 98.3°F | Resp 18 | Ht 69.0 in | Wt 245.0 lb

## 2017-10-19 DIAGNOSIS — J441 Chronic obstructive pulmonary disease with (acute) exacerbation: Secondary | ICD-10-CM

## 2017-10-19 MED ORDER — AZITHROMYCIN 250 MG PO TABS: ORAL_TABLET | ORAL | 0 refills | Status: DC

## 2017-10-19 MED ORDER — MONTELUKAST SODIUM 10 MG PO TABS: 10.0000 mg | ORAL_TABLET | Freq: Every day | ORAL | 1 refills | Status: DC

## 2017-10-19 MED ORDER — LOSARTAN POTASSIUM-HCTZ 100-25 MG PO TABS: 1.0000 | ORAL_TABLET | Freq: Every day | ORAL | 3 refills | Status: DC

## 2017-10-19 MED ORDER — PREDNISONE 20 MG PO TABS: 40.0000 mg | ORAL_TABLET | Freq: Every day | ORAL | 0 refills | Status: DC

## 2017-10-19 MED ORDER — ATORVASTATIN CALCIUM 40 MG PO TABS: 40.0000 mg | ORAL_TABLET | Freq: Every day | ORAL | 1 refills | Status: DC

## 2017-10-19 NOTE — Progress Notes (Signed)
Subjective:    Patient ID: Mario [name], male    DOB: [birth year], 71 y.o.   MRN: [id number]  HPI  Patient reports a 2-1/2-week history of cough which is nonproductive, shortness of breath, wheezing, chest congestion.  He denies any fevers.  He denies any purulent sputum.  He denies any hemoptysis.  He has been using his Symbicort once a day but is using Spiriva every day and seeing substantial benefit since starting the medication Past Medical History:  Diagnosis Date  . Asthma   . Biliary colic 2725  . COPD (chronic obstructive pulmonary disease) with chronic bronchitis ([place])   . Cyst of pancreas    1 cm (7/18), repeat ct in 12 months.    . Hyperlipidemia   . Hypertension   . Obstructive sleep apnea   . Rhinitis   . Sleep apnea    cpap   Past Surgical History:  Procedure Laterality Date  . LITHOTRIPSY  2000   for kidney stone   Current Outpatient Prescriptions on File Prior to Visit  Medication Sig Dispense Refill  . aspirin 325 MG tablet Take 81 mg by mouth daily.     . Cholecalciferol (VITAMIN D PO) Take 5,000 Units by mouth.     . Coenzyme Q10 (COQ10) 100 MG CAPS Take by mouth daily.    . diclofenac (VOLTAREN) 75 MG EC tablet Take 1 tablet (75 mg total) by mouth 2 (two) times daily. 50 tablet 2  . FIBER PO Take by mouth.    . fish oil-omega-3 fatty acids 1000 MG capsule Take 1 g by mouth daily.     . Melatonin 3 MG CAPS Take by mouth at bedtime.    Marland Kitchen OVER THE COUNTER MEDICATION Natto BP - 2 tabs po qd    . Probiotic Product (PROBIOTIC DAILY PO) Take by mouth daily.    . sildenafil (VIAGRA) 100 MG tablet Take 1 tablet (100 mg total) by mouth daily as needed. 30 tablet 3  . SYMBICORT 160-4.5 MCG/ACT inhaler INHALE 2 PUFFS INTO THE LUNGS 2 TIMES DAILY. 30.6 Inhaler 3  . Tiotropium Bromide Monohydrate (SPIRIVA RESPIMAT) 2.5 MCG/ACT AERS Inhale 2 Inhalers into the lungs daily. 4 g 5  . Zinc 50 MG CAPS Take by mouth.     No current facility-administered medications on file  prior to visit.    No Known Allergies Social History   Social History  . Marital status: Married    Spouse name: N/A  . Number of children: N/A  . Years of education: N/A   Occupational History  . Not on file.   Social History Main Topics  . Smoking status: Former Smoker    Start date: 01/08/1975  . Smokeless tobacco: Never Used  . Alcohol use Yes     Comment: 1 glass wine a month  . Drug use: No  . Sexual activity: Not on file   Other Topics Concern  . Not on file   Social History Narrative  . No narrative on file      Review of Systems  All other systems reviewed and are negative.      Objective:   Physical Exam  Constitutional: He appears well-developed and well-nourished.  HENT:  Right Ear: External ear normal.  Left Ear: External ear normal.  Nose: Nose normal.  Mouth/Throat: Oropharynx is clear and moist. No oropharyngeal exudate.  Eyes: No scleral icterus.  Neck: Neck supple. No JVD present.  Cardiovascular: Normal rate, regular rhythm and normal heart  sounds.   No murmur heard. Pulmonary/Chest: Effort normal. No respiratory distress. He has wheezes. He has no rales. He exhibits no tenderness.  Lymphadenopathy:    He has no cervical adenopathy.  Vitals reviewed.         Assessment & Plan:  COPD exacerbation I will treat the patient with prednisone 40 mg a day for 7 days plus a Z-Pak.  I will increase his Hyzaar to 100/25 1 p.o. daily for hypertension.  Continue his Spiriva and Symbicort.

## 2017-10-23 ENCOUNTER — Encounter: Payer: Self-pay | Admitting: Gastroenterology

## 2017-10-24 DIAGNOSIS — G4733 Obstructive sleep apnea (adult) (pediatric): Secondary | ICD-10-CM | POA: Diagnosis not present

## 2017-11-02 ENCOUNTER — Ambulatory Visit: Payer: Medicare Other | Admitting: Podiatry

## 2017-11-23 DIAGNOSIS — G4733 Obstructive sleep apnea (adult) (pediatric): Secondary | ICD-10-CM | POA: Diagnosis not present

## 2017-12-18 ENCOUNTER — Encounter: Payer: Self-pay | Admitting: Family Medicine

## 2017-12-18 ENCOUNTER — Ambulatory Visit (INDEPENDENT_AMBULATORY_CARE_PROVIDER_SITE_OTHER): Payer: Medicare Other | Admitting: Family Medicine

## 2017-12-18 VITALS — BP 142/80 | HR 96 | Temp 98.5°F | Resp 18 | Ht 69.0 in | Wt 247.0 lb

## 2017-12-18 DIAGNOSIS — R009 Unspecified abnormalities of heart beat: Secondary | ICD-10-CM

## 2017-12-18 DIAGNOSIS — J449 Chronic obstructive pulmonary disease, unspecified: Secondary | ICD-10-CM | POA: Diagnosis not present

## 2017-12-18 DIAGNOSIS — I4891 Unspecified atrial fibrillation: Secondary | ICD-10-CM

## 2017-12-18 MED ORDER — METOPROLOL SUCCINATE ER 25 MG PO TB24: 25.0000 mg | ORAL_TABLET | Freq: Every day | ORAL | 3 refills | Status: DC

## 2017-12-18 NOTE — Progress Notes (Signed)
Subjective:    Patient ID: Mario [name], male    DOB: 18-Aug-1946, 71 y.o.   MRN: [id number]  HPI  11/18 Patient reports a 2-1/2-week history of cough which is nonproductive, shortness of breath, wheezing, chest congestion.  He denies any fevers.  He denies any purulent sputum.  He denies any hemoptysis.  He has been using his Symbicort once a day but is using Spiriva every day and seeing substantial benefit since starting the medication.  At that time, my plan was: I will treat the patient with prednisone 40 mg a day for 7 days plus a Z-Pak.  I will increase his Hyzaar to 100/25 1 p.o. daily for hypertension.  Continue his Spiriva and Symbicort.  12/18/17 Patient is here today to discuss switching to Trelegy to help manage his asthma as he is seen significant benefit on the combination of Symbicort and Spiriva and this would help reduce cost. However on intake, the patient's vital signs were abnormal. He had a rapid irregularly irregular heartbeat. Unfortunately our EKG machine is malfunctioning and therefore I'm unable to obtain an EKG today. His heart rate was calculated to be 96 bpm when measured for a full 60 seconds manually. However his heart rate continued to be erratic and irregularly irregular consistent with atrial fibrillation. Past Medical History:  Diagnosis Date  . Asthma   . Biliary colic 4315  . COPD (chronic obstructive pulmonary disease) with chronic bronchitis ([place])   . Cyst of pancreas    1 cm (7/18), repeat ct in 12 months.    . Hyperlipidemia   . Hypertension   . Obstructive sleep apnea   . Rhinitis   . Sleep apnea    cpap   Past Surgical History:  Procedure Laterality Date  . LITHOTRIPSY  2000   for kidney stone   Current Outpatient Medications on File Prior to Visit  Medication Sig Dispense Refill  . aspirin 325 MG tablet Take 81 mg by mouth daily.     Marland Kitchen atorvastatin (LIPITOR) 40 MG tablet Take 1 tablet (40 mg total) by mouth daily. 90 tablet 1  .  Cholecalciferol (VITAMIN D PO) Take 5,000 Units by mouth.     . Coenzyme Q10 (COQ10) 100 MG CAPS Take by mouth daily.    . diclofenac (VOLTAREN) 75 MG EC tablet Take 1 tablet (75 mg total) by mouth 2 (two) times daily. 50 tablet 2  . FIBER PO Take by mouth.    . fish oil-omega-3 fatty acids 1000 MG capsule Take 1 g by mouth daily.     Marland Kitchen losartan-hydrochlorothiazide (HYZAAR) 100-25 MG tablet Take 1 tablet by mouth daily. 90 tablet 3  . Melatonin 3 MG CAPS Take by mouth at bedtime.    . montelukast (SINGULAIR) 10 MG tablet Take 1 tablet (10 mg total) by mouth daily. 90 tablet 1  . OVER THE COUNTER MEDICATION Natto BP - 2 tabs po qd    . Probiotic Product (PROBIOTIC DAILY PO) Take by mouth daily.    . sildenafil (VIAGRA) 100 MG tablet Take 1 tablet (100 mg total) by mouth daily as needed. 30 tablet 3  . SYMBICORT 160-4.5 MCG/ACT inhaler INHALE 2 PUFFS INTO THE LUNGS 2 TIMES DAILY. 30.6 Inhaler 3  . Tiotropium Bromide Monohydrate (SPIRIVA RESPIMAT) 2.5 MCG/ACT AERS Inhale 2 Inhalers into the lungs daily. 4 g 5  . Zinc 50 MG CAPS Take by mouth.     No current facility-administered medications on file prior to visit.  No Known Allergies Social History   Socioeconomic History  . Marital status: Married    Spouse name: Not on file  . Number of children: Not on file  . Years of education: Not on file  . Highest education level: Not on file  Social Needs  . Financial resource strain: Not on file  . Food insecurity - worry: Not on file  . Food insecurity - inability: Not on file  . Transportation needs - medical: Not on file  . Transportation needs - non-medical: Not on file  Occupational History  . Not on file  Tobacco Use  . Smoking status: Former Smoker    Start date: 01/08/1975  . Smokeless tobacco: Never Used  Substance and Sexual Activity  . Alcohol use: Yes    Comment: 1 glass wine a month  . Drug use: No  . Sexual activity: Not on file  Other Topics Concern  . Not on file    Social History Narrative  . Not on file      Review of Systems  All other systems reviewed and are negative.      Objective:   Physical Exam  Constitutional: He appears well-developed and well-nourished.  HENT:  Right Ear: External ear normal.  Left Ear: External ear normal.  Nose: Nose normal.  Mouth/Throat: Oropharynx is clear and moist. No oropharyngeal exudate.  Eyes: No scleral icterus.  Neck: Neck supple. No JVD present.  Cardiovascular: Normal rate and normal heart sounds. An irregularly irregular rhythm present.  No murmur heard. Pulmonary/Chest: Effort normal. No respiratory distress. He has no wheezes. He has no rales. He exhibits no tenderness.  Lymphadenopathy:    He has no cervical adenopathy.  Vitals reviewed.         Assessment & Plan:  Abnormal heart rate - Plan: EKG 12-Lead  Atrial fibrillation, unspecified type (HCC)  COPD (chronic obstructive pulmonary disease) with chronic bronchitis (HCC)  I am unable to obtain an EKG today due to technical malfunction with our EKG machine. Patient is clinically stable and shows no indication to go the hospital. We discussed his treatment options and patient does elect to begin Elocon for this 5 mg by mouth twice a day for anticoagulation, to start metoprolol XL 25 mg by mouth daily to slow his heart rate and help regulate his heart rate, and recheck on Thursday to reassess his blood pressure and heart rate on the new medication. We will also discontinue Spiriva and Symbicort and replaced with Trelegy 1 inhalation per day. Samples were given to the patient to last 1 month of both his breathing medication and eliquis.

## 2017-12-21 ENCOUNTER — Encounter: Payer: Self-pay | Admitting: Family Medicine

## 2017-12-21 ENCOUNTER — Ambulatory Visit (INDEPENDENT_AMBULATORY_CARE_PROVIDER_SITE_OTHER): Payer: Medicare Other | Admitting: Family Medicine

## 2017-12-21 VITALS — BP 122/70 | HR 64 | Temp 98.0°F | Resp 18 | Ht 69.0 in | Wt 250.0 lb

## 2017-12-21 DIAGNOSIS — I480 Paroxysmal atrial fibrillation: Secondary | ICD-10-CM

## 2017-12-21 NOTE — Progress Notes (Signed)
Subjective:    Patient ID: Mario [name], male    DOB: July 27, 1946, 72 y.o.   MRN: [id number]  HPI  11/18 Patient reports a 2-1/2-week history of cough which is nonproductive, shortness of breath, wheezing, chest congestion.  He denies any fevers.  He denies any purulent sputum.  He denies any hemoptysis.  He has been using his Symbicort once a day but is using Spiriva every day and seeing substantial benefit since starting the medication.  At that time, my plan was: I will treat the patient with prednisone 40 mg a day for 7 days plus a Z-Pak.  I will increase his Hyzaar to 100/25 1 p.o. daily for hypertension.  Continue his Spiriva and Symbicort.  12/18/17 Patient is here today to discuss switching to Trelegy to help manage his asthma as he is seen significant benefit on the combination of Symbicort and Spiriva and this would help reduce cost. However on intake, the patient's vital signs were abnormal. He had a rapid irregularly irregular heartbeat. Unfortunately our EKG machine is malfunctioning and therefore I'm unable to obtain an EKG today. His heart rate was calculated to be 96 bpm when measured for a full 60 seconds manually. However his heart rate continued to be erratic and irregularly irregular consistent with atrial fibrillation.  At that time, my plan was: I am unable to obtain an EKG today due to technical malfunction with our EKG machine. Patient is clinically stable and shows no indication to go the hospital. We discussed his treatment options and patient does elect to begin Elocon for this 5 mg by mouth twice a day for anticoagulation, to start metoprolol XL 25 mg by mouth daily to slow his heart rate and help regulate his heart rate, and recheck on Thursday to reassess his blood pressure and heart rate on the new medication. We will also discontinue Spiriva and Symbicort and replaced with Trelegy 1 inhalation per day. Samples were given to the patient to last 1 month of both his  breathing medication and eliquis.  12/21/17 Patient is here today for follow-up.  Patient has spontaneously converted back to normal sinus rhythm. Unfortunately at his last visit I was unable to obtain an EKG due to a technical malfunction in our equipment. Today's EKG shows normal sinus rhythm with nonspecific ST changes in lead 1 and aVL. He also has evidence of left atrial enlargement with a U wave in V1 and a double P-wave in the lateral leads. Patient states he feels much better today. He does not feel short of breath. He can feel when his heart flips in and out of rhythm Past Medical History:  Diagnosis Date  . Asthma   . Biliary colic 4098  . COPD (chronic obstructive pulmonary disease) with chronic bronchitis ([place])   . Cyst of pancreas    1 cm (7/18), repeat ct in 12 months.    . Hyperlipidemia   . Hypertension   . Obstructive sleep apnea   . Rhinitis   . Sleep apnea    cpap   Past Surgical History:  Procedure Laterality Date  . LITHOTRIPSY  2000   for kidney stone   Current Outpatient Medications on File Prior to Visit  Medication Sig Dispense Refill  . aspirin 325 MG tablet Take 81 mg by mouth daily.     Marland Kitchen atorvastatin (LIPITOR) 40 MG tablet Take 1 tablet (40 mg total) by mouth daily. 90 tablet 1  . Cholecalciferol (VITAMIN D PO) Take 5,000 Units by  mouth.     . Coenzyme Q10 (COQ10) 100 MG CAPS Take by mouth daily.    . diclofenac (VOLTAREN) 75 MG EC tablet Take 1 tablet (75 mg total) by mouth 2 (two) times daily. 50 tablet 2  . FIBER PO Take by mouth.    . fish oil-omega-3 fatty acids 1000 MG capsule Take 1 g by mouth daily.     . Fluticasone-Umeclidin-Vilant (TRELEGY ELLIPTA) 100-62.5-25 MCG/INH AEPB Inhale into the lungs.    Marland Kitchen losartan-hydrochlorothiazide (HYZAAR) 100-25 MG tablet Take 1 tablet by mouth daily. 90 tablet 3  . Melatonin 3 MG CAPS Take by mouth at bedtime.    . metoprolol succinate (TOPROL-XL) 25 MG 24 hr tablet Take 1 tablet (25 mg total) by mouth daily.  90 tablet 3  . montelukast (SINGULAIR) 10 MG tablet Take 1 tablet (10 mg total) by mouth daily. 90 tablet 1  . Probiotic Product (PROBIOTIC DAILY PO) Take by mouth daily.    . sildenafil (VIAGRA) 100 MG tablet Take 1 tablet (100 mg total) by mouth daily as needed. 30 tablet 3  . Zinc 50 MG CAPS Take by mouth.     No current facility-administered medications on file prior to visit.    No Known Allergies Social History   Socioeconomic History  . Marital status: Married    Spouse name: Not on file  . Number of children: Not on file  . Years of education: Not on file  . Highest education level: Not on file  Social Needs  . Financial resource strain: Not on file  . Food insecurity - worry: Not on file  . Food insecurity - inability: Not on file  . Transportation needs - medical: Not on file  . Transportation needs - non-medical: Not on file  Occupational History  . Not on file  Tobacco Use  . Smoking status: Former Smoker    Start date: 01/08/1975  . Smokeless tobacco: Never Used  Substance and Sexual Activity  . Alcohol use: Yes    Comment: 1 glass wine a month  . Drug use: No  . Sexual activity: Not on file  Other Topics Concern  . Not on file  Social History Narrative  . Not on file      Review of Systems  All other systems reviewed and are negative.      Objective:   Physical Exam  Constitutional: He appears well-developed and well-nourished.  HENT:  Right Ear: External ear normal.  Left Ear: External ear normal.  Nose: Nose normal.  Mouth/Throat: Oropharynx is clear and moist. No oropharyngeal exudate.  Eyes: No scleral icterus.  Neck: Neck supple. No JVD present.  Cardiovascular: Normal rate, regular rhythm and normal heart sounds.  No murmur heard. Pulmonary/Chest: Effort normal. No respiratory distress. He has no wheezes. He has no rales. He exhibits no tenderness.  Lymphadenopathy:    He has no cervical adenopathy.  Vitals reviewed.           Assessment & Plan:   Paroxysmal atrial fibrillation ([place]) - Plan: Ambulatory referral to Cardiology  Recommend a cardiology consultation for an event monitor however I am confident that his rhythm at his last visit was atrial fibrillation. Continue metoprolol for rate control. Continue eliquis 5 mg twice a day as an anticoagulant. Consult cardiology for an echocardiogram to rule out structural lesions in the heart that may precipitate atrial fibrillation.

## 2017-12-29 ENCOUNTER — Ambulatory Visit: Payer: Medicare Other | Admitting: Cardiovascular Disease

## 2017-12-29 ENCOUNTER — Encounter: Payer: Self-pay | Admitting: Cardiovascular Disease

## 2017-12-29 DIAGNOSIS — G4733 Obstructive sleep apnea (adult) (pediatric): Secondary | ICD-10-CM

## 2017-12-29 DIAGNOSIS — I480 Paroxysmal atrial fibrillation: Secondary | ICD-10-CM

## 2017-12-29 DIAGNOSIS — I10 Essential (primary) hypertension: Secondary | ICD-10-CM | POA: Diagnosis not present

## 2017-12-29 MED ORDER — APIXABAN 5 MG PO TABS: 5.0000 mg | ORAL_TABLET | Freq: Two times a day (BID) | ORAL | 1 refills | Status: DC

## 2017-12-29 NOTE — Progress Notes (Signed)
12/29/2017 [name]   12/28/1945  [id number]  Primary Physician Dennard Schaumann [name], MD Primary Cardiologist: [name] MD Lupe Carney, [place]  HPI:  Mario [name] is a 72 y.o. 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 He was referred by [name], his PCP, for evaluation and treatment of PAF. He has a history of hypertension, and hyperlipidemia. He does have obstructive sleep apnea on CPAP. He's never had a heart attack or stroke and denies chest pain or shortness of breath. 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 He drinks 2 cups of caffeine a day. He's had palpitations for the last 2 years. Mostly basis lasting up to one hour at a time. These recently in Dr. Samella Parr office for this was appreciated although EKG was not done because of his machine malfunctioning. He was begun empirically on Eliquis and low-dose beta blocker.   Current Meds  Medication Sig  . atorvastatin (LIPITOR) 40 MG tablet Take 1 tablet (40 mg total) by mouth daily.  . Cholecalciferol (VITAMIN D PO) Take 5,000 Units by mouth.   . Coenzyme Q10 (COQ10) 100 MG CAPS Take by mouth daily.  . diclofenac (VOLTAREN) 75 MG EC tablet Take 1 tablet (75 mg total) by mouth 2 (two) times daily.  Marland Kitchen FIBER PO Take by mouth.  . fish oil-omega-3 fatty acids 1000 MG capsule Take 1 g by mouth daily.   . Fluticasone-Umeclidin-Vilant (TRELEGY ELLIPTA) 100-62.5-25 MCG/INH AEPB Inhale into the lungs.  Marland Kitchen losartan-hydrochlorothiazide (HYZAAR) 100-25 MG tablet Take 1 tablet by mouth daily.  . metoprolol succinate (TOPROL-XL) 25 MG 24 hr tablet Take 1 tablet (25 mg total) by mouth daily.  . montelukast (SINGULAIR) 10 MG tablet Take 1 tablet (10 mg total) by mouth daily.  . Probiotic Product (PROBIOTIC DAILY PO) Take by mouth daily.     No Known Allergies  Social  History   Socioeconomic History  . Marital status: Married    Spouse name: Not on file  . Number of children: Not on file  . Years of education: Not on file  . Highest education level: Not on file  Social Needs  . Financial resource strain: Not on file  . Food insecurity - worry: Not on file  . Food insecurity - inability: Not on file  . Transportation needs - medical: Not on file  . Transportation needs - non-medical: Not on file  Occupational History  . Not on file  Tobacco Use  . Smoking status: Former Smoker    Start date: 01/08/1975  . Smokeless tobacco: Never Used  Substance and Sexual Activity  . Alcohol use: Yes    Comment: 1 glass wine a month  . Drug use: No  . Sexual activity: Not on file  Other Topics Concern  . Not on file  Social History Narrative  . Not on file     Review of Systems: General: negative for chills, fever, night sweats or weight changes.  Cardiovascular: negative for chest pain, dyspnea on exertion, edema, orthopnea, palpitations, paroxysmal nocturnal dyspnea or shortness of breath Dermatological: negative for rash Respiratory: negative for cough or wheezing Urologic: negative for hematuria Abdominal: negative for nausea, vomiting, diarrhea, bright red blood per rectum, melena, or hematemesis Neurologic: negative for visual changes, syncope, or dizziness All other systems reviewed and are otherwise negative except as noted above.    Blood  pressure (!) 152/90, pulse 62, height 5\' 9"  (1.753 m), weight 245 lb 3.2 oz (111.2 kg), SpO2 96 %.  General appearance: alert and no distress Neck: no adenopathy, no carotid bruit, no JVD, supple, symmetrical, trachea midline and thyroid not enlarged, symmetric, no tenderness/mass/nodules Lungs: clear to auscultation bilaterally Heart: regular rate and rhythm, S1, S2 normal, no murmur, click, rub or gallop Extremities: extremities normal, atraumatic, no cyanosis or edema Pulses: 2+ and symmetric Skin:  Skin color, texture, turgor normal. No rashes or lesions Neurologic: Alert and oriented X 3, normal strength and tone. Normal symmetric reflexes. Normal coordination and gait  EKG not performed today  ASSESSMENT AND PLAN:   Paroxysmal atrial fibrillation [facility]) Mr. Anglin was referred to me by [name] for evaluation of PAF. He has had palpitations on and off for 2 years or so and was recently in his PCPs office where he was noted to be tachycardic and irregular. Unfortunately his EKG machine was not working at that time and subsequent EKGs have been negative. He was begun empirically on low-dose beta blocker and Eliquis presumptively. A 2-D echocardiogram performed 2016 was essentially normal. I am going to obtain get a 30 day event monitor to document his A. fib.  HYPERLIPIDEMIA History of hyperlipidemia on statin therapy followed by his PCP  Essential hypertension History of essential hypertension blood pressures measured at 152/90. He is on losartan, hydrochlorothiazide and metoprolol. Continue current meds are current dosing  Obstructive sleep apnea History of obstructive sleep apnea on CPAP which he benefits from.      [name] MD FACP,FACC,FAHA, [facility] 12/29/2017 8:47 AM

## 2017-12-29 NOTE — Patient Instructions (Signed)
Medication Instructions: Your physician recommends that you continue on your current medications as directed. Please refer to the Current Medication list given to you today.   Testing/Procedures: Your physician has recommended that you wear a 30 day event monitor. Event monitors are medical devices that record the heart's electrical activity. Doctors most often us these monitors to diagnose arrhythmias. Arrhythmias are problems with the speed or rhythm of the heartbeat. The monitor is a small, portable device. You can wear one while you do your normal daily activities. This is usually used to diagnose what is causing palpitations/syncope (passing out).  Follow-Up: Your physician wants you to follow-up in: 1 year with Dr. Berry. You will receive a reminder letter in the mail two months in advance. If you don't receive a letter, please call our office to schedule the follow-up appointment.  If you need a refill on your cardiac medications before your next appointment, please call your pharmacy.  

## 2017-12-29 NOTE — Assessment & Plan Note (Signed)
History of essential hypertension blood pressures measured at 152/90. He is on losartan, hydrochlorothiazide and metoprolol. Continue current meds are current dosing

## 2017-12-29 NOTE — Assessment & Plan Note (Signed)
Mario [name] was referred to me by [name] for evaluation of PAF. He has had palpitations on and off for 2 years or so and was recently in his PCPs office where he was noted to be tachycardic and irregular. Unfortunately his EKG machine was not working at that time and subsequent EKGs have been negative. He was begun empirically on low-dose beta blocker and Eliquis presumptively. A 2-D echocardiogram performed 2016 was essentially normal. I am going to obtain get a 30 day event monitor to document his A. fib.

## 2017-12-29 NOTE — Assessment & Plan Note (Signed)
History of hyperlipidemia on statin therapy followed by his PCP 

## 2017-12-29 NOTE — Assessment & Plan Note (Signed)
History of obstructive sleep apnea on CPAP which he benefits from 

## 2018-01-08 ENCOUNTER — Ambulatory Visit (INDEPENDENT_AMBULATORY_CARE_PROVIDER_SITE_OTHER): Payer: Medicare Other

## 2018-01-08 DIAGNOSIS — I480 Paroxysmal atrial fibrillation: Secondary | ICD-10-CM | POA: Diagnosis not present

## 2018-01-24 ENCOUNTER — Other Ambulatory Visit: Payer: Self-pay | Admitting: Family Medicine

## 2018-01-24 MED ORDER — FLUTICASONE-UMECLIDIN-VILANT 100-62.5-25 MCG/INH IN AEPB: 1.0000 | INHALATION_SPRAY | Freq: Every day | RESPIRATORY_TRACT | 5 refills | Status: DC

## 2018-01-25 DIAGNOSIS — M170 Bilateral primary osteoarthritis of knee: Secondary | ICD-10-CM | POA: Diagnosis not present

## 2018-05-01 ENCOUNTER — Ambulatory Visit (INDEPENDENT_AMBULATORY_CARE_PROVIDER_SITE_OTHER): Payer: Medicare Other | Admitting: Family Medicine

## 2018-05-01 ENCOUNTER — Ambulatory Visit: Payer: Medicare Other | Admitting: Family Medicine

## 2018-05-01 VITALS — BP 130/72 | HR 90 | Temp 98.0°F | Resp 18 | Ht 69.0 in | Wt 239.0 lb

## 2018-05-01 DIAGNOSIS — J4541 Moderate persistent asthma with (acute) exacerbation: Secondary | ICD-10-CM | POA: Diagnosis not present

## 2018-05-01 DIAGNOSIS — D229 Melanocytic nevi, unspecified: Secondary | ICD-10-CM

## 2018-05-01 DIAGNOSIS — L821 Other seborrheic keratosis: Secondary | ICD-10-CM | POA: Diagnosis not present

## 2018-05-01 MED ORDER — PREDNISONE 20 MG PO TABS: ORAL_TABLET | ORAL | 0 refills | Status: DC

## 2018-05-01 MED ORDER — ALBUTEROL SULFATE HFA 108 (90 BASE) MCG/ACT IN AERS: 2.0000 | INHALATION_SPRAY | Freq: Four times a day (QID) | RESPIRATORY_TRACT | 2 refills | Status: DC | PRN

## 2018-05-01 NOTE — Progress Notes (Signed)
Subjective:    Patient ID: Mario [name], male    DOB: 10-02-1946, 72 y.o.   MRN: [id number]  HPI Patient presents with a 3-week history of increasing shortness of breath.  He has audible expiratory wheezing all throughout all 4 lung fields.  He has been using his trilogy however he has had increasing asthma symptoms due to her recent exacerbation of allergies.  He denies any fevers, chills, hemoptysis, purulent sputum.  He denies any chest pain.  He also has an atypical mole on his right lateral upper bicep.  It is approximately 5 mm in diameter.  It is irregularly shaped with poorly circumscribed borders.  It also has irregular colors.  All this is concerning for possible underlying melanoma. Past Medical History:  Diagnosis Date  . Asthma   . Biliary colic 3474  . COPD (chronic obstructive pulmonary disease) with chronic bronchitis ([place])   . Cyst of pancreas    1 cm (7/18), repeat ct in 12 months.    . Hyperlipidemia   . Hypertension   . Obstructive sleep apnea   . Rhinitis   . Sleep apnea    cpap   Past Surgical History:  Procedure Laterality Date  . LITHOTRIPSY  2000   for kidney stone   Current Outpatient Medications on File Prior to Visit  Medication Sig Dispense Refill  . apixaban (ELIQUIS) 5 MG TABS tablet Take 1 tablet (5 mg total) by mouth 2 (two) times daily. 180 tablet 1  . atorvastatin (LIPITOR) 40 MG tablet Take 1 tablet (40 mg total) by mouth daily. 90 tablet 1  . Cholecalciferol (VITAMIN D PO) Take 5,000 Units by mouth.     . Coenzyme Q10 (COQ10) 100 MG CAPS Take by mouth daily.    . diclofenac (VOLTAREN) 75 MG EC tablet Take 1 tablet (75 mg total) by mouth 2 (two) times daily. 50 tablet 2  . FIBER PO Take by mouth.    . fish oil-omega-3 fatty acids 1000 MG capsule Take 1 g by mouth daily.     . Fluticasone-Umeclidin-Vilant (TRELEGY ELLIPTA) 100-62.5-25 MCG/INH AEPB Inhale 1 puff into the lungs daily. 30 each 5  . losartan-hydrochlorothiazide (HYZAAR) 100-25 MG  tablet Take 1 tablet by mouth daily. 90 tablet 3  . metoprolol succinate (TOPROL-XL) 25 MG 24 hr tablet Take 1 tablet (25 mg total) by mouth daily. 90 tablet 3  . montelukast (SINGULAIR) 10 MG tablet Take 1 tablet (10 mg total) by mouth daily. 90 tablet 1  . Probiotic Product (PROBIOTIC DAILY PO) Take by mouth daily.     No current facility-administered medications on file prior to visit.    No Known Allergies Social History   Socioeconomic History  . Marital status: Married    Spouse name: Not on file  . Number of children: Not on file  . Years of education: Not on file  . Highest education level: Not on file  Occupational History  . Not on file  Social Needs  . Financial resource strain: Not on file  . Food insecurity:    Worry: Not on file    Inability: Not on file  . Transportation needs:    Medical: Not on file    Non-medical: Not on file  Tobacco Use  . Smoking status: Former Smoker    Start date: 01/08/1975  . Smokeless tobacco: Never Used  Substance and Sexual Activity  . Alcohol use: Yes    Comment: 1 glass wine a month  . Drug  use: No  . Sexual activity: Not on file  Lifestyle  . Physical activity:    Days per week: Not on file    Minutes per session: Not on file  . Stress: Not on file  Relationships  . Social connections:    Talks on phone: Not on file    Gets together: Not on file    Attends religious service: Not on file    Active member of club or organization: Not on file    Attends meetings of clubs or organizations: Not on file    Relationship status: Not on file  . Intimate partner violence:    Fear of current or ex partner: Not on file    Emotionally abused: Not on file    Physically abused: Not on file    Forced sexual activity: Not on file  Other Topics Concern  . Not on file  Social History Narrative  . Not on file      Review of Systems  All other systems reviewed and are negative.      Objective:   Physical Exam    Constitutional: He appears well-developed and well-nourished.  HENT:  Head: Normocephalic and atraumatic.  Nose: Nose normal.  Mouth/Throat: Oropharynx is clear and moist. No oropharyngeal exudate.  Cardiovascular: Normal rate, regular rhythm and normal heart sounds.  Pulmonary/Chest: Effort normal. No stridor. No respiratory distress. He has wheezes. He has no rales.  Vitals reviewed.         Assessment & Plan:  Moderate persistent asthma with exacerbation - Plan: predniSONE (DELTASONE) 20 MG tablet, albuterol (PROVENTIL HFA;VENTOLIN HFA) 108 (90 Base) MCG/ACT inhaler  Atypical mole - Plan: Pathology  Patient has an asthma exacerbation.  Begin prednisone taper pack in addition to albuterol 2 puffs inhaled every 6 hours as needed.  Lesion on the arm is concerning for an atypical mole.  It was anesthetized with 0.1% lidocaine with epinephrine.  Shave biopsy was performed using sterile technique and it was sent to pathology in a labeled container.  Hemostasis was achieved with Drysol and a Band-Aid.  Await biopsy results.

## 2018-05-04 LAB — PATHOLOGY

## 2018-05-04 LAB — TISSUE SPECIMEN

## 2018-05-10 ENCOUNTER — Encounter: Payer: Self-pay | Admitting: Family Medicine

## 2018-05-10 ENCOUNTER — Other Ambulatory Visit: Payer: Self-pay

## 2018-05-10 ENCOUNTER — Ambulatory Visit (INDEPENDENT_AMBULATORY_CARE_PROVIDER_SITE_OTHER): Payer: Medicare Other | Admitting: Family Medicine

## 2018-05-10 VITALS — BP 138/74 | HR 62 | Temp 98.1°F | Resp 16 | Ht 69.0 in | Wt 229.0 lb

## 2018-05-10 DIAGNOSIS — J449 Chronic obstructive pulmonary disease, unspecified: Secondary | ICD-10-CM

## 2018-05-10 DIAGNOSIS — J181 Lobar pneumonia, unspecified organism: Secondary | ICD-10-CM

## 2018-05-10 DIAGNOSIS — J189 Pneumonia, unspecified organism: Secondary | ICD-10-CM

## 2018-05-10 MED ORDER — LEVOFLOXACIN 500 MG PO TABS: 500.0000 mg | ORAL_TABLET | Freq: Every day | ORAL | 0 refills | Status: DC

## 2018-05-10 MED ORDER — PREDNISONE 20 MG PO TABS: 40.0000 mg | ORAL_TABLET | Freq: Every day | ORAL | 0 refills | Status: DC

## 2018-05-10 NOTE — Progress Notes (Signed)
Subjective:    Patient ID: Mario [name], male    DOB: Apr 16, 1946, 72 y.o.   MRN: [id number]  HPI  05/01/18 Patient presents with a 3-week history of increasing shortness of breath.  He has audible expiratory wheezing all throughout all 4 lung fields.  He has been using his trelegy however he has had increasing asthma symptoms due to her recent exacerbation of allergies.  He denies any fevers, chills, hemoptysis, purulent sputum.  He denies any chest pain.  He also has an atypical mole on his right lateral upper bicep.  It is approximately 5 mm in diameter.  It is irregularly shaped with poorly circumscribed borders.  It also has irregular colors.  All this is concerning for possible underlying melanoma.  At that time, my plan was: Patient has an asthma exacerbation.  Begin prednisone taper pack in addition to albuterol 2 puffs inhaled every 6 hours as needed.  Lesion on the arm is concerning for an atypical mole.  It was anesthetized with 0.1% lidocaine with epinephrine.  Shave biopsy was performed using sterile technique and it was sent to pathology in a labeled container.  Hemostasis was achieved with Drysol and a Band-Aid.  Await biopsy results.  05/10/18 Unfortunately, the patient's cough is getting worse.  He saw no improvement on prednisone.  Cough is now productive of yellow and green sputum.  He is also reporting some chest pain.  On exam today, he has diffuse expiratory wheezing.  He has rhonchorous breath sounds in all 4 lung quadrants.  However he has pronounced right basilar crackles concerning for developing pneumonia versus effusion.  Past Medical History:  Diagnosis Date  . Asthma   . Biliary colic 2458  . COPD (chronic obstructive pulmonary disease) with chronic bronchitis ([place])   . Cyst of pancreas    1 cm (7/18), repeat ct in 12 months.    . Hyperlipidemia   . Hypertension   . Obstructive sleep apnea   . Rhinitis   . Sleep apnea    cpap   Past Surgical History:    Procedure Laterality Date  . LITHOTRIPSY  2000   for kidney stone   Current Outpatient Medications on File Prior to Visit  Medication Sig Dispense Refill  . albuterol (PROVENTIL HFA;VENTOLIN HFA) 108 (90 Base) MCG/ACT inhaler Inhale 2 puffs into the lungs every 6 (six) hours as needed for wheezing or shortness of breath. 1 Inhaler 2  . apixaban (ELIQUIS) 5 MG TABS tablet Take 1 tablet (5 mg total) by mouth 2 (two) times daily. 180 tablet 1  . atorvastatin (LIPITOR) 40 MG tablet Take 1 tablet (40 mg total) by mouth daily. 90 tablet 1  . Cholecalciferol (VITAMIN D PO) Take 5,000 Units by mouth.     . Coenzyme Q10 (COQ10) 100 MG CAPS Take by mouth daily.    . diclofenac (VOLTAREN) 75 MG EC tablet Take 1 tablet (75 mg total) by mouth 2 (two) times daily. 50 tablet 2  . FIBER PO Take by mouth.    . fish oil-omega-3 fatty acids 1000 MG capsule Take 1 g by mouth daily.     . Fluticasone-Umeclidin-Vilant (TRELEGY ELLIPTA) 100-62.5-25 MCG/INH AEPB Inhale 1 puff into the lungs daily. 30 each 5  . losartan-hydrochlorothiazide (HYZAAR) 100-25 MG tablet Take 1 tablet by mouth daily. 90 tablet 3  . metoprolol succinate (TOPROL-XL) 25 MG 24 hr tablet Take 1 tablet (25 mg total) by mouth daily. 90 tablet 3  . montelukast (SINGULAIR) 10 MG  tablet Take 1 tablet (10 mg total) by mouth daily. 90 tablet 1  . Probiotic Product (PROBIOTIC DAILY PO) Take by mouth daily.     No current facility-administered medications on file prior to visit.    No Known Allergies Social History   Socioeconomic History  . Marital status: Married    Spouse name: Not on file  . Number of children: Not on file  . Years of education: Not on file  . Highest education level: Not on file  Occupational History  . Not on file  Social Needs  . Financial resource strain: Not on file  . Food insecurity:    Worry: Not on file    Inability: Not on file  . Transportation needs:    Medical: Not on file    Non-medical: Not on file   Tobacco Use  . Smoking status: Former Smoker    Start date: 01/08/1975  . Smokeless tobacco: Never Used  Substance and Sexual Activity  . Alcohol use: Yes    Comment: 1 glass wine a month  . Drug use: No  . Sexual activity: Not on file  Lifestyle  . Physical activity:    Days per week: Not on file    Minutes per session: Not on file  . Stress: Not on file  Relationships  . Social connections:    Talks on phone: Not on file    Gets together: Not on file    Attends religious service: Not on file    Active member of club or organization: Not on file    Attends meetings of clubs or organizations: Not on file    Relationship status: Not on file  . Intimate partner violence:    Fear of current or ex partner: Not on file    Emotionally abused: Not on file    Physically abused: Not on file    Forced sexual activity: Not on file  Other Topics Concern  . Not on file  Social History Narrative  . Not on file      Review of Systems  Respiratory: Positive for cough.   All other systems reviewed and are negative.      Objective:   Physical Exam  Constitutional: He appears well-developed and well-nourished.  HENT:  Head: Normocephalic and atraumatic.  Nose: Nose normal.  Mouth/Throat: Oropharynx is clear and moist. No oropharyngeal exudate.  Cardiovascular: Normal rate, regular rhythm and normal heart sounds.  Pulmonary/Chest: Effort normal. No stridor. No respiratory distress. He has wheezes in the right upper field, the right lower field, the left upper field and the left lower field. He has rhonchi in the right upper field, the right lower field, the left upper field and the left lower field. He has rales in the right lower field.      Vitals reviewed.         Assessment & Plan:  CAP, asthma exacerbation   Patient appears to be developing community-acquired pneumonia in addition to asthma exacerbation.  Continue the patient on prednisone 40 mg p.o. daily for 7 days  similar to how to treat a COPD exacerbation but add Levaquin 500 mg p.o. daily for 7 days and reassess in 48 hours or sooner if worse

## 2018-05-25 ENCOUNTER — Encounter: Payer: Self-pay | Admitting: Family Medicine

## 2018-05-25 ENCOUNTER — Ambulatory Visit
Admission: RE | Admit: 2018-05-25 | Discharge: 2018-05-25 | Disposition: A | Discharge status: Home / Self Care | Payer: Medicare Other | Source: Ambulatory Visit | Attending: Family Medicine | Admitting: Family Medicine

## 2018-05-25 ENCOUNTER — Ambulatory Visit (INDEPENDENT_AMBULATORY_CARE_PROVIDER_SITE_OTHER): Payer: Medicare Other | Admitting: Family Medicine

## 2018-05-25 VITALS — BP 126/80 | HR 94 | Temp 98.5°F | Resp 18 | Ht 69.0 in | Wt 233.0 lb

## 2018-05-25 DIAGNOSIS — J4541 Moderate persistent asthma with (acute) exacerbation: Secondary | ICD-10-CM | POA: Diagnosis not present

## 2018-05-25 DIAGNOSIS — R0600 Dyspnea, unspecified: Secondary | ICD-10-CM

## 2018-05-25 MED ORDER — PANTOPRAZOLE SODIUM 40 MG PO TBEC: 40.0000 mg | DELAYED_RELEASE_TABLET | Freq: Two times a day (BID) | ORAL | 3 refills | Status: DC

## 2018-05-25 MED ORDER — PREDNISONE 20 MG PO TABS: ORAL_TABLET | ORAL | 0 refills | Status: DC

## 2018-05-25 NOTE — Progress Notes (Signed)
Subjective:    Patient ID: Mario [name], male    DOB: [birth year], 72 y.o.   MRN: [id number]  Cough     05/01/18 Patient presents with a 3-week history of increasing shortness of breath.  He has audible expiratory wheezing all throughout all 4 lung fields.  He has been using his trelegy however he has had increasing asthma symptoms due to her recent exacerbation of allergies.  He denies any fevers, chills, hemoptysis, purulent sputum.  He denies any chest pain.  He also has an atypical mole on his right lateral upper bicep.  It is approximately 5 mm in diameter.  It is irregularly shaped with poorly circumscribed borders.  It also has irregular colors.  All this is concerning for possible underlying melanoma.  At that time, my plan was: Patient has an asthma exacerbation.  Begin prednisone taper pack in addition to albuterol 2 puffs inhaled every 6 hours as needed.  Lesion on the arm is concerning for an atypical mole.  It was anesthetized with 0.1% lidocaine with epinephrine.  Shave biopsy was performed using sterile technique and it was sent to pathology in a labeled container.  Hemostasis was achieved with Drysol and a Band-Aid.  Await biopsy results.  05/10/18 Unfortunately, the patient's cough is getting worse.  He saw no improvement on prednisone.  Cough is now productive of yellow and green sputum.  He is also reporting some chest pain.  On exam today, he has diffuse expiratory wheezing.  He has rhonchorous breath sounds in all 4 lung quadrants.  However he has pronounced right basilar crackles concerning for developing pneumonia versus effusion. At that time, my plan was:  Patient appears to be developing community-acquired pneumonia in addition to asthma exacerbation.  Continue the patient on prednisone 40 mg p.o. daily for 7 days similar to how to treat a COPD exacerbation but add Levaquin 500 mg p.o. daily for 7 days and reassess in 48 hours or sooner if worse  05/25/18 Patient saw initial  improvement on antibiotics and prednisone however as soon as prednisone was discontinued, symptoms returned just a severe.  He reports right-sided chest congestion that has not improved.  He subjectively believes the left side is clear.  However he reports bilateral wheezing, shortness of breath with activity, a cough productive of clear and yellow sputum.  He denies any fevers or chills.  He denies any hemoptysis.  He denies any significant pleurisy.  He denies any heartburn.  He denies any allergies, postnasal drip, rhinorrhea, or sneezing.  He denies any night sweats fevers chills or weight loss. Past Medical History:  Diagnosis Date  . Asthma   . Biliary colic 9629  . COPD (chronic obstructive pulmonary disease) with chronic bronchitis ([place])   . Cyst of pancreas    1 cm (7/18), repeat ct in 12 months.    . Hyperlipidemia   . Hypertension   . Obstructive sleep apnea   . Rhinitis   . Sleep apnea    cpap   Past Surgical History:  Procedure Laterality Date  . LITHOTRIPSY  2000   for kidney stone   Current Outpatient Medications on File Prior to Visit  Medication Sig Dispense Refill  . albuterol (PROVENTIL HFA;VENTOLIN HFA) 108 (90 Base) MCG/ACT inhaler Inhale 2 puffs into the lungs every 6 (six) hours as needed for wheezing or shortness of breath. 1 Inhaler 2  . apixaban (ELIQUIS) 5 MG TABS tablet Take 1 tablet (5 mg total) by mouth 2 (two)  times daily. 180 tablet 1  . atorvastatin (LIPITOR) 40 MG tablet Take 1 tablet (40 mg total) by mouth daily. 90 tablet 1  . Cholecalciferol (VITAMIN D PO) Take 5,000 Units by mouth.     . Coenzyme Q10 (COQ10) 100 MG CAPS Take by mouth daily.    Marland Kitchen FIBER PO Take by mouth.    . fish oil-omega-3 fatty acids 1000 MG capsule Take 1 g by mouth daily.     . Fluticasone-Umeclidin-Vilant (TRELEGY ELLIPTA) 100-62.5-25 MCG/INH AEPB Inhale 1 puff into the lungs daily. 30 each 5  . losartan-hydrochlorothiazide (HYZAAR) 100-25 MG tablet Take 1 tablet by mouth  daily. 90 tablet 3  . metoprolol succinate (TOPROL-XL) 25 MG 24 hr tablet Take 1 tablet (25 mg total) by mouth daily. 90 tablet 3  . montelukast (SINGULAIR) 10 MG tablet Take 1 tablet (10 mg total) by mouth daily. 90 tablet 1  . Probiotic Product (PROBIOTIC DAILY PO) Take by mouth daily.     No current facility-administered medications on file prior to visit.    No Known Allergies Social History   Socioeconomic History  . Marital status: Married    Spouse name: Not on file  . Number of children: Not on file  . Years of education: Not on file  . Highest education level: Not on file  Occupational History  . Not on file  Social Needs  . Financial resource strain: Not on file  . Food insecurity:    Worry: Not on file    Inability: Not on file  . Transportation needs:    Medical: Not on file    Non-medical: Not on file  Tobacco Use  . Smoking status: Former Smoker    Start date: 01/08/1975  . Smokeless tobacco: Never Used  Substance and Sexual Activity  . Alcohol use: Yes    Comment: 1 glass wine a month  . Drug use: No  . Sexual activity: Not on file  Lifestyle  . Physical activity:    Days per week: Not on file    Minutes per session: Not on file  . Stress: Not on file  Relationships  . Social connections:    Talks on phone: Not on file    Gets together: Not on file    Attends religious service: Not on file    Active member of club or organization: Not on file    Attends meetings of clubs or organizations: Not on file    Relationship status: Not on file  . Intimate partner violence:    Fear of current or ex partner: Not on file    Emotionally abused: Not on file    Physically abused: Not on file    Forced sexual activity: Not on file  Other Topics Concern  . Not on file  Social History Narrative  . Not on file      Review of Systems  Respiratory: Positive for cough.   All other systems reviewed and are negative.      Objective:   Physical Exam    Constitutional: He appears well-developed and well-nourished.  HENT:  Head: Normocephalic and atraumatic.  Nose: Nose normal.  Mouth/Throat: Oropharynx is clear and moist. No oropharyngeal exudate.  Cardiovascular: Normal rate, regular rhythm and normal heart sounds.  Pulmonary/Chest: Effort normal. No stridor. No respiratory distress. He has wheezes in the right upper field, the right lower field, the left upper field and the left lower field. He has no rhonchi. He has no rales.  Vitals reviewed.         Assessment & Plan:  Dyspnea, unspecified type - Plan: DG Chest 2 View  Moderate persistent asthma with acute exacerbation  Patient continues to have steroid responsive exacerbations of his asthma despite being on maximum medical therapy with Trelegy once a day as well as Singulair and allergy medication.  Even though he is having no symptoms, silent acid reflux could be exacerbating his asthma.  Therefore I will start the patient on Protonix 40 mg twice a day and recheck the patient in 2 weeks.  I will also obtain a chest x-ray to rule out silent infection or other opacity/infiltrate the requires further work-up.  I will also consult pulmonology as I have exhausted everything I can think of for his asthma.  I will treat the acute exacerbation with a prednisone taper pack.

## 2018-06-12 ENCOUNTER — Ambulatory Visit (INDEPENDENT_AMBULATORY_CARE_PROVIDER_SITE_OTHER): Payer: Medicare Other | Admitting: Family Medicine

## 2018-06-12 VITALS — BP 130/70 | HR 62 | Temp 98.0°F | Resp 16 | Ht 69.0 in | Wt 230.0 lb

## 2018-06-12 DIAGNOSIS — R0600 Dyspnea, unspecified: Secondary | ICD-10-CM

## 2018-06-12 DIAGNOSIS — R05 Cough: Secondary | ICD-10-CM | POA: Diagnosis not present

## 2018-06-12 DIAGNOSIS — J181 Lobar pneumonia, unspecified organism: Secondary | ICD-10-CM | POA: Diagnosis not present

## 2018-06-12 DIAGNOSIS — J189 Pneumonia, unspecified organism: Secondary | ICD-10-CM

## 2018-06-12 MED ORDER — IPRATROPIUM-ALBUTEROL 0.5-2.5 (3) MG/3ML IN SOLN
3.0000 mL | Freq: Once | RESPIRATORY_TRACT | Status: AC
  Administered 2018-06-12: 3 mL via RESPIRATORY_TRACT

## 2018-06-12 MED ORDER — ALBUTEROL SULFATE (2.5 MG/3ML) 0.083% IN NEBU: 2.5000 mg | INHALATION_SOLUTION | Freq: Four times a day (QID) | RESPIRATORY_TRACT | 12 refills | Status: DC | PRN

## 2018-06-12 NOTE — Patient Instructions (Addendum)
Add mucinex daily and drink ample fluids with it - (sugar free, clear liquids is best)  Use albuterol nebulizer as needed, but definitely before bedtime.

## 2018-06-12 NOTE — Progress Notes (Signed)
Patient ID: Mario [name], male    DOB: 1946/06/22, 72 y.o.   MRN: [id number]  PCP: [name], MD  Chief Complaint  Patient presents with  . Chest congestion, cough, fatigue    has apt with pulm 07/05/18    Subjective:   Mario [name] is a 72 y.o. male, with pertinent past medical history of asthma, COPD, obstructive sleep apnea, hyperlipidemia, hypertension, proximal A. fib presents to clinic with CC off continued productive cough with wheezing shortness of breath x6 weeks.    He states that since being diagnosed 6 weeks ago with pneumonia and asthma exacerbation, being treated with a long steroid burst and a week of Levaquin he has had "absolutely no improvement" and he continues to be extremely wheezy, fatigued, tired with exertional shortness of breath.  Cough is severe, intermittently productive with yellow to green sputum. Also activities like speaking at a conference for 45 minutes he felt like he might not make it through would not but he might "not make it through."  Walking short distances also causes him to be short of breath.  Coughing and wheeze is much more severe at night and he is getting very little sleep.  He is a former smoker, with history of asthma.  Current management of asthma/COPD is with  trelegy and albuterol rescue inhaler PRN.  For the past several weeks he's been using it "all the time" for wheeze and shortness of breath, w/o any improvement.  He does have a nebulizer machine but does not have any albuterol vials.  Recent illness was the only time that a large dose of steroids did not help his asthma exacerbation.  He does not believe trelegy has been as effective for his asthma as Spiriva and Symbicort was.  He does have a first appointment pulmonology coming up in roughly 3 weeks but symptoms continue to be too severe and he did not want to wait until then to be seen.  He reports that any past infections in lungs were only effectively tx with a 10 day course  of abx, and shorter courses did not work.    He denies orthopnea, lower extremity edema, chest pressure, chest pain.  He also denies any fever, hot/cold chills, night sweats, hemoptysis, unintentional weight loss, palpitations, near syncope.  He is a former smoker, has medical history of hypertension, dyslipidemia, obesity, asthma, OSA on CPAP reports excellent compliance and routine sterilization of his device..  07/24/15:  Cardiac echo done to evaluate lower extremity edema, transthoracic echo was normal with LVEF 55-60%, normal wall motion, no hypertrophy, no diastolic or systolic dysfunction.   01/08/18: Cardiac event monitor significant for normal sinus rhythm with PAF, treated with Eliquis and Toprol-XL 25 mg     Patient Active Problem List   Diagnosis Date Noted  . Paroxysmal atrial fibrillation ([place]) 12/29/2017  . COPD (chronic obstructive pulmonary disease) with chronic bronchitis ([place])   . Asthma   . Hyperlipidemia   . Hypertension   . Epigastric abdominal pain 12/12/2012  . Obstructive sleep apnea   . Rhinitis   . HYPERLIPIDEMIA 12/24/2009  . Essential hypertension 12/24/2009  . ASTHMA 12/24/2009  . KNEE PAIN, LEFT 12/24/2009  . MEDIAL MENISCUS TEAR, LEFT 12/24/2009     Prior to Admission medications   Medication Sig Start Date End Date Taking? Authorizing Provider  albuterol (PROVENTIL HFA;VENTOLIN HFA) 108 (90 Base) MCG/ACT inhaler Inhale 2 puffs into the lungs every 6 (six) hours as needed for wheezing or  shortness of breath. 05/01/18  Yes [name], MD  apixaban (ELIQUIS) 5 MG TABS tablet Take 1 tablet (5 mg total) by mouth 2 (two) times daily. 12/29/17  Yes [name], MD  atorvastatin (LIPITOR) 40 MG tablet Take 1 tablet (40 mg total) by mouth daily. 10/19/17  Yes [name], MD  Cholecalciferol (VITAMIN D PO) Take 5,000 Units by mouth.    Yes [provider]  Coenzyme Q10 (COQ10) 100 MG CAPS Take by mouth daily.   Yes [provider]  FIBER PO Take by mouth.   Yes [provider]  fish oil-omega-3 fatty acids 1000 MG capsule Take 1 g by mouth daily.    Yes [provider]  Fluticasone-Umeclidin-Vilant (TRELEGY ELLIPTA) 100-62.5-25 MCG/INH AEPB Inhale 1 puff into the lungs daily. 01/24/18  Yes [name], MD  losartan-hydrochlorothiazide (HYZAAR) 100-25 MG tablet Take 1 tablet by mouth daily. 10/19/17  Yes [name], MD  metoprolol succinate (TOPROL-XL) 25 MG 24 hr tablet Take 1 tablet (25 mg total) by mouth daily. 12/18/17  Yes [name], MD  montelukast (SINGULAIR) 10 MG tablet Take 1 tablet (10 mg total) by mouth daily. 10/19/17  Yes [name], MD  pantoprazole (PROTONIX) 40 MG tablet Take 1 tablet (40 mg total) by mouth 2 (two) times daily. 05/25/18  Yes [name], MD  Probiotic Product (PROBIOTIC DAILY PO) Take by mouth daily.   Yes [provider]     No Known Allergies   Family History  Problem Relation Age of Onset  . Rheumatic fever Mother   . Stroke Father   . Atrial fibrillation Father   . Arthritis Sister   . Colon cancer Neg Hx      Social History   Socioeconomic History  . Marital status: Married    Spouse name: Not on file  . Number of children: Not on file  . Years of education: Not on file  . Highest education level: Not on file  Occupational History  . Not on file  Social Needs  . Financial resource strain: Not on file  . Food insecurity:    Worry: Not on file    Inability: Not on file  . Transportation needs:    Medical: Not on file    Non-medical: Not on file  Tobacco Use  . Smoking status: Former Smoker    Start date: 01/08/1975  . Smokeless tobacco: Never Used  Substance and Sexual Activity  . Alcohol use: Yes    Comment: 1 glass wine a month  . Drug use: No  . Sexual activity: Not on file  Lifestyle  . Physical activity:    Days per week: Not on file    Minutes per session: Not on file  . Stress:  Not on file  Relationships  . Social connections:    Talks on phone: Not on file    Gets together: Not on file    Attends religious service: Not on file    Active member of club or organization: Not on file    Attends meetings of clubs or organizations: Not on file    Relationship status: Not on file  . Intimate partner violence:    Fear of current or ex partner: Not on file    Emotionally abused: Not on file    Physically abused: Not on file    Forced sexual activity: Not on file  Other Topics Concern  . Not on file  Social History Narrative  . Not on file     Review of Systems  Constitutional: Positive for activity change and fatigue. Negative for appetite change, chills, diaphoresis, fever and unexpected weight change.  HENT: Negative.   Eyes: Negative.   Respiratory: Negative for choking.   Cardiovascular: Negative.   Gastrointestinal: Negative.   Endocrine: Negative.   Genitourinary: Negative.   Musculoskeletal: Negative.   Skin: Negative.   Allergic/Immunologic: Negative.   Neurological: Negative.   Hematological: Negative.   Psychiatric/Behavioral: Negative.   All other systems reviewed and are negative.      Objective:    Vitals:   06/12/18 1408  BP: 130/70  Pulse: 62  Resp: 16  Temp: 98 F (36.7 C)  TempSrc: Oral  SpO2: 95%  Weight: 230 lb (104.3 kg)  Height: 5\' 9"  (1.753 m)      Physical Exam  Constitutional: He is oriented to person, place, and time. He appears well-developed and well-nourished.  Non-toxic appearance. He does not appear ill. No distress.  Obese male, NAD, non-toxic appearing, frequent coughing  HENT:  Head: Normocephalic and atraumatic.  Right Ear: Tympanic membrane, external ear and ear canal normal.  Left Ear: Tympanic membrane, external ear and ear canal normal.  Nose: No mucosal edema or rhinorrhea. Right sinus exhibits no maxillary sinus tenderness and no frontal sinus tenderness. Left sinus exhibits no maxillary sinus  tenderness and no frontal sinus tenderness.  Mouth/Throat: Uvula is midline. No trismus in the jaw. No uvula swelling. No oropharyngeal exudate, posterior oropharyngeal edema or posterior oropharyngeal erythema.  Eyes: Pupils are equal, round, and reactive to light. Conjunctivae, EOM and lids are normal.  Neck: Trachea normal, normal range of motion and phonation normal. Neck supple. No tracheal deviation present.  Cardiovascular: Normal rate, regular rhythm, normal heart sounds, intact distal pulses and normal pulses. Exam reveals no gallop and no friction rub.  No murmur heard. Pulses:      Radial pulses are 2+ on the right side, and 2+ on the left side.       Posterior tibial pulses are 2+ on the right side, and 2+ on the left side.  No le edema  Pulmonary/Chest: Effort normal. No accessory muscle usage or stridor. No tachypnea. No respiratory distress. He has decreased breath sounds. He has wheezes. He has no rhonchi. He has rales (coarse, no change with coughing) in the right lower field and the left lower field. He exhibits no tenderness.  Abdominal: Soft. Normal appearance and bowel sounds are normal. He exhibits no distension. There is no tenderness. There is no rebound and no guarding.  Musculoskeletal: Normal range of motion. He exhibits no edema.  Lymphadenopathy:    He has no cervical adenopathy.       Right: No supraclavicular adenopathy present.       Left: No supraclavicular adenopathy present.  Neurological: He is alert and oriented to person, place, and time. He exhibits normal muscle tone. Coordination and gait normal.  Skin: Skin is warm, dry and intact. Capillary refill takes less than 2 seconds. No rash noted. He is not diaphoretic.  Psychiatric: He has a normal mood and affect. His speech is normal and behavior is normal.  Nursing note and vitals reviewed.    Results:  CLINICAL DATA:  Dyspnea, unspecified type.  EXAM: CHEST - 2 VIEW  COMPARISON:  Chest radiograph  12/11/2012.  CT chest 07/07/2017.  FINDINGS: Normal cardiomediastinal silhouette. LEFT greater than RIGHT bibasilar subsegmental scarring or atelectasis. No definite consolidation or  edema. No effusion or pneumothorax. Degenerative change thoracic spine.  IMPRESSION: Bibasilar atelectasis or scarring similar to priors. No consolidation or edema.   Electronically Signed   By: Staci Righter M.D.   On: 05/25/2018 17:16     CLINICAL DATA:  Cough, shortness of breath, and wheezing. History of asthma-COPD, former smoker. The patient underwent treatment for community-acquired pneumonia 6 weeks ago and has not clinically improved.  EXAM: CHEST - 2 VIEW  COMPARISON:  PA and lateral chest x-ray of May 25, 2018  FINDINGS: The lungs are mildly hyperinflated with hemidiaphragm flattening. There is persistent increased density at the left lung base. There is no pleural effusion. The heart and pulmonary vascularity are normal. The mediastinum is normal in width. There is faint calcification in the wall of the aortic arch. There is mild multilevel degenerative disc disease of the thoracic spine.  IMPRESSION: COPD-reactive airway disease. Persistent increased density in the left lower lobe may reflect bronchiectasis or resistant pneumonia. Repeat chest CT scanning is recommended given the lack of clinical improvement following therapy.   Electronically Signed   By: David  Martinique M.D.   On: 06/13/2018 14:44  Results for orders placed or performed in visit on 06/12/18  CBC with Differential  Result Value Ref Range   WBC 11.1 (H) 3.8 - 10.8 Thousand/uL   RBC 5.06 4.20 - 5.80 Million/uL   Hemoglobin 15.0 13.2 - 17.1 g/dL   HCT 44.6 38.5 - 50.0 %   MCV 88.1 80.0 - 100.0 fL   MCH 29.6 27.0 - 33.0 pg   MCHC 33.6 32.0 - 36.0 g/dL   RDW 13.7 11.0 - 15.0 %   Platelets 348 140 - 400 Thousand/uL   MPV 10.0 7.5 - 12.5 fL   Neutro Abs 6,971 1,500 - 7,800 cells/uL   Lymphs  Abs 2,930 850 - 3,900 cells/uL   WBC mixed population 810 200 - 950 cells/uL   Eosinophils Absolute 289 15 - 500 cells/uL   Basophils Absolute 100 0 - 200 cells/uL   Neutrophils Relative % 62.8 %   Total Lymphocyte 26.4 %   Monocytes Relative 7.3 %   Eosinophils Relative 2.6 %   Basophils Relative 0.9 %  COMPLETE METABOLIC PANEL WITH GFR  Result Value Ref Range   Glucose, Bld 105 (H) 65 - 99 mg/dL   BUN 20 7 - 25 mg/dL   Creat 1.19 (H) 0.70 - 1.18 mg/dL   GFR, Est Non African American 60 > OR = 60 mL/min/1.69m2   GFR, Est African American 70 > OR = 60 mL/min/1.74m2   BUN/Creatinine Ratio 17 6 - 22 (calc)   Sodium 140 135 - 146 mmol/L   Potassium 4.4 3.5 - 5.3 mmol/L   Chloride 104 98 - 110 mmol/L   CO2 30 20 - 32 mmol/L   Calcium 9.7 8.6 - 10.3 mg/dL   Total Protein 6.7 6.1 - 8.1 g/dL   Albumin 4.2 3.6 - 5.1 g/dL   Globulin 2.5 1.9 - 3.7 g/dL (calc)   AG Ratio 1.7 1.0 - 2.5 (calc)   Total Bilirubin 0.6 0.2 - 1.2 mg/dL   Alkaline phosphatase (APISO) 48 40 - 115 U/L   AST 30 10 - 35 U/L   ALT 31 9 - 46 U/L  Brain natriuretic peptide  Result Value Ref Range   Brain Natriuretic Peptide 38 <100 pg/mL     Assessment & Plan:      ICD-10-CM   1. Community acquired pneumonia of left lower lobe of  lung (HCC) J18.1 albuterol (PROVENTIL) (2.5 MG/3ML) 0.083% nebulizer solution    DG Chest 2 View    CBC with Differential    COMPLETE METABOLIC PANEL WITH GFR    Brain natriuretic peptide    amoxicillin-clavulanate (AUGMENTIN) 875-125 MG tablet    doxycycline (VIBRA-TABS) 100 MG tablet    predniSONE (DELTASONE) 20 MG tablet    CT Chest Wo Contrast  2. Dyspnea, unspecified type R06.00 albuterol (PROVENTIL) (2.5 MG/3ML) 0.083% nebulizer solution    DG Chest 2 View    CBC with Differential    COMPLETE METABOLIC PANEL WITH GFR    Brain natriuretic peptide    ipratropium-albuterol (DUONEB) 0.5-2.5 (3) MG/3ML nebulizer solution 3 mL    CT Chest Wo Contrast    72 year old male with  history of asthma, COPD, OSA on CPAP, presents for recheck of cough, wheeze, dyspnea and fatigue there is been ongoing for 6 weeks since being diagnosed and treated for pneumonia.  Per chart review he has been seen 4 times in the last 5 weeks or so for asthma exacerbation.  Chart reviewed:  05/01/18:  Presented with 3 wks SOB with wheeze with worsening seasonal allergies, dx asthma exacerbation, tx with prednisone taper and albuterol  05/10/18: Symptoms worsened with productive cough, had basilar crackles, dx CAP asthma exacerbation, tx with prednisone 40 mg x 7d, levaquin 500 mg x 7  05/25/18:  Returned with mild temporary improvement of sx which worsened with completion of steroids, continued productive cough with wheeze, exertional dyspnea.  Dx moderate persistent asthma despite max medical tx with trelegy, SABA, singulair, Tx CXR, treated possible GERD with protonix, pulmonology referral, prednisone taper.  Patient received breathing treatment in clinic and requested albuterol vials, agree with neb laser treatments at home, he would likely benefit significantly from a treatment prior to bedtime since his nighttime symptoms are severe, it likely multifactorial asthma, obstructive sleep apnea and I suspect some obesity hypoventilation syndrome as well.  He denies any orthopnea and does not appear clinically fluid overloaded but will obtain BNP to eval CHF because exertional dyspnea, fatigue and bibasilar rales dose put CHF/pulm edema in ddx. Will obtain repeat a chest x-ray to evaluate.  Much longer steroid taper at this point because steroids has been the only thing that is given him minimal relief.  Encouraged him to continue to use allergy controlling medications included over-the-counter antihistamines, nasal sprays and montelukast.  Add Mucinex.    Patient was contacted after hours on 06/12/18 with CXR results and abx ordered.  Radiology recommended Chest CT, which I have ordered, to further evaluate  infiltrate, PNA, vs scarring further.  Pulm fibrosis?    Pt does have f/up with pulmonology.  Encouraged to follow up here with any worsening in the interim.  Patient verbalizes understanding of ER precautions.    Delsa Grana, PA-C 06/14/18 8:11 PM

## 2018-06-13 ENCOUNTER — Ambulatory Visit
Admission: RE | Admit: 2018-06-13 | Discharge: 2018-06-13 | Disposition: A | Discharge status: Home / Self Care | Payer: Medicare Other | Source: Ambulatory Visit | Attending: Family Medicine | Admitting: Family Medicine

## 2018-06-13 ENCOUNTER — Encounter: Payer: Self-pay | Admitting: Family Medicine

## 2018-06-13 DIAGNOSIS — R05 Cough: Secondary | ICD-10-CM | POA: Diagnosis not present

## 2018-06-13 LAB — CBC WITH DIFFERENTIAL/PLATELET
BASOS ABS: 100 {cells}/uL (ref 0–200)
Basophils Relative: 0.9 %
EOS PCT: 2.6 %
Eosinophils Absolute: 289 cells/uL (ref 15–500)
HCT: 44.6 % (ref 38.5–50.0)
Hemoglobin: 15 g/dL (ref 13.2–17.1)
Lymphs Abs: 2930 cells/uL (ref 850–3900)
MCH: 29.6 pg (ref 27.0–33.0)
MCHC: 33.6 g/dL (ref 32.0–36.0)
MCV: 88.1 fL (ref 80.0–100.0)
MONOS PCT: 7.3 %
MPV: 10 fL (ref 7.5–12.5)
NEUTROS PCT: 62.8 %
Neutro Abs: 6971 cells/uL (ref 1500–7800)
Platelets: 348 10*3/uL (ref 140–400)
RBC: 5.06 10*6/uL (ref 4.20–5.80)
RDW: 13.7 % (ref 11.0–15.0)
TOTAL LYMPHOCYTE: 26.4 %
WBC mixed population: 810 cells/uL (ref 200–950)
WBC: 11.1 10*3/uL — ABNORMAL HIGH (ref 3.8–10.8)

## 2018-06-13 LAB — COMPLETE METABOLIC PANEL WITH GFR
AG RATIO: 1.7 (calc) (ref 1.0–2.5)
ALBUMIN MSPROF: 4.2 g/dL (ref 3.6–5.1)
ALT: 31 U/L (ref 9–46)
AST: 30 U/L (ref 10–35)
Alkaline phosphatase (APISO): 48 U/L (ref 40–115)
BUN / CREAT RATIO: 17 (calc) (ref 6–22)
BUN: 20 mg/dL (ref 7–25)
CALCIUM: 9.7 mg/dL (ref 8.6–10.3)
CO2: 30 mmol/L (ref 20–32)
CREATININE: 1.19 mg/dL — AB (ref 0.70–1.18)
Chloride: 104 mmol/L (ref 98–110)
GFR, EST NON AFRICAN AMERICAN: 60 mL/min/{1.73_m2} (ref >=60)
GFR, Est African American: 70 mL/min/{1.73_m2} (ref >=60)
GLOBULIN: 2.5 g/dL (ref 1.9–3.7)
Glucose, Bld: 105 mg/dL — ABNORMAL HIGH (ref 65–99)
POTASSIUM: 4.4 mmol/L (ref 3.5–5.3)
SODIUM: 140 mmol/L (ref 135–146)
Total Bilirubin: 0.6 mg/dL (ref 0.2–1.2)
Total Protein: 6.7 g/dL (ref 6.1–8.1)

## 2018-06-13 LAB — BRAIN NATRIURETIC PEPTIDE: Brain Natriuretic Peptide: 38 pg/mL (ref <100)

## 2018-06-13 MED ORDER — DOXYCYCLINE HYCLATE 100 MG PO TABS: 100.0000 mg | ORAL_TABLET | Freq: Two times a day (BID) | ORAL | 0 refills | Status: AC

## 2018-06-13 MED ORDER — PREDNISONE 20 MG PO TABS: ORAL_TABLET | ORAL | 0 refills | Status: DC

## 2018-06-13 MED ORDER — AMOXICILLIN-POT CLAVULANATE 875-125 MG PO TABS
1.0000 | ORAL_TABLET | Freq: Two times a day (BID) | ORAL | 0 refills | Status: DC
Start: 2018-06-13 — End: 2018-07-05

## 2018-06-13 NOTE — Progress Notes (Signed)
Reviewed all lab results with pt.  BNP reassuring that CXR infiltrate, dyspnea and coarse rales on exam is not likely to be CHF/pulm edema.

## 2018-06-13 NOTE — Progress Notes (Signed)
Called pt and reviewed CXR finding and recommendations for f/up CT chest.  Personally reviewed images, CT chest w/o contrast ordered.  Rx'd augmentin and doxycycline for possible resistant pneumonia.

## 2018-06-18 DIAGNOSIS — G4733 Obstructive sleep apnea (adult) (pediatric): Secondary | ICD-10-CM | POA: Diagnosis not present

## 2018-06-26 ENCOUNTER — Ambulatory Visit
Admission: RE | Admit: 2018-06-26 | Discharge: 2018-06-26 | Disposition: A | Discharge status: Home / Self Care | Payer: Medicare Other | Source: Ambulatory Visit | Attending: Family Medicine | Admitting: Family Medicine

## 2018-06-26 DIAGNOSIS — J479 Bronchiectasis, uncomplicated: Secondary | ICD-10-CM | POA: Diagnosis not present

## 2018-06-26 DIAGNOSIS — J45909 Unspecified asthma, uncomplicated: Secondary | ICD-10-CM | POA: Diagnosis not present

## 2018-06-27 ENCOUNTER — Other Ambulatory Visit: Payer: Self-pay | Admitting: Family Medicine

## 2018-06-27 NOTE — Progress Notes (Signed)
Reviewed chest CT and called and discussed with pt.  He has improved wheeze and SOB after tx with abx (Augmentin x 10 d) and long steroid taper.  When taper ended his coughing did get worse again, frequent, severe, clear sputum.  He denies any new fever, chills, sweats.  He is using albuterol nebs but the relief if very brief.    He also is going to pharmacy today to switch inhalers from trelegy back to prior inhalers (spiriva and symbicort - I believe) he feels like they worked better.  He has pulmonology appt in 8 days, will f/up with them.

## 2018-07-03 ENCOUNTER — Other Ambulatory Visit: Payer: Self-pay | Admitting: Family Medicine

## 2018-07-05 ENCOUNTER — Ambulatory Visit: Payer: Medicare Other | Admitting: Internal Medicine

## 2018-07-05 ENCOUNTER — Other Ambulatory Visit (INDEPENDENT_AMBULATORY_CARE_PROVIDER_SITE_OTHER): Payer: Medicare Other

## 2018-07-05 ENCOUNTER — Encounter: Payer: Self-pay | Admitting: Internal Medicine

## 2018-07-05 VITALS — BP 140/86 | HR 86 | Ht 69.0 in | Wt 224.0 lb

## 2018-07-05 DIAGNOSIS — J479 Bronchiectasis, uncomplicated: Secondary | ICD-10-CM

## 2018-07-05 DIAGNOSIS — R05 Cough: Secondary | ICD-10-CM

## 2018-07-05 DIAGNOSIS — G4733 Obstructive sleep apnea (adult) (pediatric): Secondary | ICD-10-CM | POA: Diagnosis not present

## 2018-07-05 DIAGNOSIS — J4540 Moderate persistent asthma, uncomplicated: Secondary | ICD-10-CM

## 2018-07-05 DIAGNOSIS — J471 Bronchiectasis with (acute) exacerbation: Secondary | ICD-10-CM | POA: Insufficient documentation

## 2018-07-05 DIAGNOSIS — R059 Cough, unspecified: Secondary | ICD-10-CM

## 2018-07-05 DIAGNOSIS — J181 Lobar pneumonia, unspecified organism: Secondary | ICD-10-CM | POA: Diagnosis not present

## 2018-07-05 LAB — CBC WITH DIFFERENTIAL/PLATELET
Basophils Absolute: 0.1 10*3/uL (ref 0.0–0.1)
Basophils Relative: 0.7 % (ref 0.0–3.0)
EOS PCT: 1.5 % (ref 0.0–5.0)
Eosinophils Absolute: 0.1 10*3/uL (ref 0.0–0.7)
HCT: 45.9 % (ref 39.0–52.0)
Hemoglobin: 15.1 g/dL (ref 13.0–17.0)
LYMPHS ABS: 1.5 10*3/uL (ref 0.7–4.0)
Lymphocytes Relative: 15.3 % (ref 12.0–46.0)
MCHC: 32.9 g/dL (ref 30.0–36.0)
MCV: 90.6 fl (ref 78.0–100.0)
MONOS PCT: 6.9 % (ref 3.0–12.0)
Monocytes Absolute: 0.7 10*3/uL (ref 0.1–1.0)
NEUTROS ABS: 7.3 10*3/uL (ref 1.4–7.7)
NEUTROS PCT: 75.6 % (ref 43.0–77.0)
PLATELETS: 276 10*3/uL (ref 150.0–400.0)
RBC: 5.07 Mil/uL (ref 4.22–5.81)
RDW: 15.4 % (ref 11.5–15.5)
WBC: 9.7 10*3/uL (ref 4.0–10.5)

## 2018-07-05 LAB — SEDIMENTATION RATE: Sed Rate: 49 mm/hr — ABNORMAL HIGH (ref 0–20)

## 2018-07-05 MED ORDER — FLUTTER DEVI: 0 refills | Status: DC

## 2018-07-05 MED ORDER — SYMBICORT 160-4.5 MCG/ACT IN AERO: 2.0000 | INHALATION_SPRAY | Freq: Two times a day (BID) | RESPIRATORY_TRACT | 0 refills | Status: DC

## 2018-07-05 NOTE — Patient Instructions (Addendum)
Bronchiectasis =   you have scarring of your bronchial tubes which means that they don't function perfectly normally and mucus tends to pool in certain areas of your lung which can cause pneumonia and further scarring of your lung and bronchial tubes  Whenever you develop cough congestion take mucinex or mucinex dm > these will help keep the mucus loose and flowing but if your condition worsens you need to seek help immediately preferably here or somewhere inside the [facility] system to compare xrays ( worse = darker or bloody mucus or pain on breathing in)     Use the flutter valve as much as possible   Protonix 40 mg Take 30- 60 min before your first and last meals of the day   GERD (REFLUX)  is an extremely common cause of respiratory symptoms just like yours , many times with no obvious heartburn at all.    It can be treated with medication, but also with lifestyle changes including elevation of the head of your bed (ideally with 6 inch  bed blocks),  Smoking cessation, avoidance of late meals, excessive alcohol, and avoid fatty foods, chocolate, peppermint, colas, red wine, and acidic juices such as orange juice.  NO MINT OR MENTHOL PRODUCTS SO NO COUGH DROPS   USE SUGARLESS CANDY INSTEAD (Jolley ranchers or Stover's or Life Savers) or even ice chips will also do - the key is to swallow to prevent all throat clearing. NO OIL BASED VITAMINS - use powdered substitutes.   Please see patient coordinator before you leave today  to schedule sinus CT    Please remember to go to the lab department downstairs in the basement  for your tests - we will call you with the results when they are available.      Please schedule a follow up office visit in 4 weeks, sooner if needed with PFTs on return

## 2018-07-05 NOTE — Progress Notes (Signed)
Mario [name], male    DOB: [birth year], 72 y.o.   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 with def correlation with his symptoms  but resolved by age 72 then recurred while smoking in 1976 and seemed to get worse while in [place]> pulmonary eval rx and happy with results but on daily meds ever since moved to [place] in 1991 and saw [place] allergy in 2000 for placed on shots x maybe a year but no change in maint therapy rx which seemed to help but freq flares changed from symbicort/spiriva  to trelegy then much worse while on trelegy which was around 03/2018 with cough/ wheeze>  On his own switched back to symbicort/spiriva around 06/25/18 and referred to pulmonary clinic 07/05/2018 by Dr   Dennard Schaumann.     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 Pulmonary Consult    Referred by [name]. Pt c/o cough and SOB x 3 months. He is coughing up clear sputum, esp worse in the am. He uses proair 1-2 x daily on average and has neb with albuterol that he rarely uses.   has sensation "lungs full of congestion" but doesn't cough much up and what he does is clear  cpap helps at hs / sleeps  on R side and bed is flat/ on left side side down wheezing keeps him up even on cpap Last pred 10 days prior to OV  And not better whereas previously worked great. Finished the abx about the time of the last ct 06/26/18   No obvious day to day or daytime variability or assoc excess/ purulent sputum or mucus plugs or hemoptysis or cp or chest tightness, subjective wheeze or overt sinus or hb symptoms.   On R side/ cpap does fine without nocturnal  or early am exacerbation  of respiratory  c/o's or need for noct saba. Also denies any obvious fluctuation of symptoms with weather or environmental changes or other  aggravating or alleviating factors except as outlined above   No unusual exposure hx or h/o childhood pna/ asthma or knowledge of premature birth.  Current Allergies, Complete Past Medical History, Past Surgical History, Family History, and Social History were reviewed in [employer] record.  ROS  The following are not active complaints unless bolded Hoarseness, sore throat, dysphagia, dental problems, itching, sneezing,  nasal congestion or discharge of excess mucus or purulent secretions, ear ache,   fever, chills, sweats, unintended wt loss or wt gain, classically pleuritic or exertional cp,  orthopnea pnd or arm/hand swelling  or leg swelling, presyncope, palpitations, abdominal pain, anorexia, nausea, vomiting, diarrhea  or change in bowel habits or change in bladder habits, change in stools or change in urine, dysuria, hematuria,  rash, arthralgias, visual complaints, headache, numbness, weakness or ataxia or problems with walking or coordination,  change in mood or  memory.                      Past Medical History:  Diagnosis Date  . Asthma   . Biliary colic 6834  . COPD (chronic obstructive pulmonary disease) with chronic bronchitis ([place])   . Cyst of pancreas    1 cm (7/18), repeat ct in 12 months.    Marland Kitchen  Hyperlipidemia   . Hypertension   . Obstructive sleep apnea   . Rhinitis   . Sleep apnea    cpap    Outpatient Medications Prior to Visit  Medication Sig Dispense Refill  . albuterol (PROVENTIL HFA;VENTOLIN HFA) 108 (90 Base) MCG/ACT inhaler Inhale 2 puffs into the lungs every 6 (six) hours as needed for wheezing or shortness of breath. 1 Inhaler 2  . albuterol (PROVENTIL) (2.5 MG/3ML) 0.083% nebulizer solution Take 3 mLs (2.5 mg total) by nebulization every 6 (six) hours as needed for wheezing or shortness of breath. 75 mL 12  . aspirin EC 81 MG tablet Take 81 mg by mouth daily.    Marland Kitchen atorvastatin (LIPITOR) 40 MG tablet Take 1 tablet (40 mg  total) by mouth daily. 90 tablet 1  . Cholecalciferol (VITAMIN D PO) Take 5,000 Units by mouth.     . Coenzyme Q10 (COQ10) 100 MG CAPS Take by mouth daily.    Marland Kitchen FIBER PO Take by mouth.    . fish oil-omega-3 fatty acids 1000 MG capsule Take 1 g by mouth daily.     Marland Kitchen glucosamine-chondroitin 500-400 MG tablet Take 1 tablet by mouth 3 (three) times daily.    Marland Kitchen losartan-hydrochlorothiazide (HYZAAR) 100-25 MG tablet Take 1 tablet by mouth daily. 90 tablet 3  . montelukast (SINGULAIR) 10 MG tablet Take 1 tablet (10 mg total) by mouth daily. 90 tablet 1  . pantoprazole (PROTONIX) 40 MG tablet Take 1 tablet (40 mg total) by mouth 2 (two) times daily. 30 tablet 3  . SPIRIVA RESPIMAT 2.5 MCG/ACT AERS INHALE 2 INHALERS INTO THE LUNGS DAILY. 3 Inhaler 3  . SYMBICORT 160-4.5 MCG/ACT inhaler Inhale 2 puffs into the lungs 2 (two) times daily.  3  . UNABLE TO FIND Med Name: clear lungs otc 4 x daily    . amoxicillin-clavulanate (AUGMENTIN) 875-125 MG tablet Take 1 tablet by mouth 2 (two) times daily. 20 tablet 0  . apixaban (ELIQUIS) 5 MG TABS tablet Take 1 tablet (5 mg total) by mouth 2 (two) times daily. 180 tablet 1  . Fluticasone-Umeclidin-Vilant (TRELEGY ELLIPTA) 100-62.5-25 MCG/INH AEPB Inhale 1 puff into the lungs daily. 30 each 5  . metoprolol succinate (TOPROL-XL) 25 MG 24 hr tablet Take 1 tablet (25 mg total) by mouth daily. 90 tablet 3  . predniSONE (DELTASONE) 20 MG tablet 3 tabs poqday 1-3, 2 tabs poqday 4-7, 1 tab poqday 8-12 22 tablet 0  . Probiotic Product (PROBIOTIC DAILY PO) Take by mouth daily.     No facility-administered medications prior to visit.              Objective:     BP 140/86 (BP Location: Left Arm, Cuff Size: Normal)   Pulse 86   Ht 5\' 9"  (1.753 m)   Wt 224 lb (101.6 kg)   SpO2 95%   BMI 33.08 kg/m   SpO2: 95 %  RA   Wt Readings from Last 3 Encounters:  07/05/18 224 lb (101.6 kg)  06/12/18 230 lb (104.3 kg)  05/25/18 233 lb (105.7 kg)        HEENT: nl  dentition,   and oropharynx. Nl external ear canals without cough reflex - moderate bilateral non-specific turbinate edema     NECK :  without JVD/Nodes/TM/ nl carotid upstrokes bilaterally   LUNGS: no acc muscle use,  Nl contour chest with insp /exp coarse rhonchi bilaterally   CV:  RRR  no s3 or murmur or increase in P2, and  no edema   ABD:  soft and nontender with nl inspiratory excursion in the supine position. No bruits or organomegaly appreciated, bowel sounds nl  MS:  Nl gait/ ext warm without deformities, calf tenderness, cyanosis or clubbing No obvious joint restrictions   SKIN: warm and dry without lesions    NEURO:  alert, approp, nl sensorium with  no motor or cerebellar deficits apparent.      I personally reviewed images and agree with radiology impression as follows:   Chest CT w/o contrast 06/26/18 1. Ground-glass nodularity in the bases, left greater than right. There is also some scattered ground-glass and nodularity in the bilateral upper lobes, less prominent than the lower lobes. These findings are consistent with an infectious or inflammatory process. 2. There is a 5.5 x 10.5 x 5.8 mm nodule in the right apex not seen in July of 2018. This could represent an infectious process or mucous plugging given the other findings. A new neoplasm is not excluded. Recommend a short-term follow-up chest CT after treatment to ensure resolution. 3. Bronchiectasis in the right middle lobe and bases. 4. Coronary artery calcifications. Mild atherosclerotic change in the thoracic aorta. 5. Moderate hiatal hernia. 6. Bronchiectasis in the right middle lobe and bases. Streaky atelectasis.   Labs ordered 07/05/2018   Alpha one screen, Ig's, allergy profile     Lab Results  Component Value Date   ESRSEDRATE 49 (H) 07/05/2018    Lab Results  Component Value Date   WBC 9.7 07/05/2018   HGB 15.1 07/05/2018   HCT 45.9 07/05/2018   MCV 90.6 07/05/2018   PLT 276.0  07/05/2018       EOS                                                               0.1                                    07/05/2018     Assessment   No problem-specific Assessment & Plan notes found for this encounter.     Christinia Gully, MD 07/05/2018

## 2018-07-06 ENCOUNTER — Encounter: Payer: Self-pay | Admitting: Internal Medicine

## 2018-07-06 DIAGNOSIS — J4540 Moderate persistent asthma, uncomplicated: Secondary | ICD-10-CM | POA: Insufficient documentation

## 2018-07-06 DIAGNOSIS — J4550 Severe persistent asthma, uncomplicated: Secondary | ICD-10-CM | POA: Insufficient documentation

## 2018-07-06 NOTE — Assessment & Plan Note (Signed)
See CT chest 07/06/18 worse since 07/07/17  - Quant Ig's 07/05/2018  - Alpha one Screen 07/05/2018  - trained on use of flutter valve 07/05/2018  - Sinus CT ordered   Clearly has developed bilateral mild to moderate bronchiectasis while on approp rx for asthma raising the concern about ABPA as well as ? Of acquired immunodeficiency and ? Assoc with chronic sinusitis.  Reviewed pathophysilogy of bronchiectasis using an elevator analogy and rec mucinex /flutter valve rx and f/u with full pfts planned   Total time devoted to counseling  > 50 % of initial 60 min office visit:  review case with pt/ discussion of options/alternatives/ personally creating written customized instructions  in presence of pt  then going over those specific  Instructions directly with the pt including how to use all of the meds but in particular covering each new medication in detail and the difference between the maintenance= "automatic" meds and the prns using an action plan format for the latter (If this problem/symptom => do that organization reading Left to right).  Please see AVS from this visit for a full list of these instructions which I personally wrote for this pt and  are unique to this visit.   See device teaching which extended face to face time for this visit

## 2018-07-06 NOTE — Assessment & Plan Note (Signed)
Reviewed goals of asthma rx/ no change needed for now, f/u with pfts planned

## 2018-07-09 ENCOUNTER — Other Ambulatory Visit: Payer: Self-pay | Admitting: Internal Medicine

## 2018-07-09 DIAGNOSIS — J479 Bronchiectasis, uncomplicated: Secondary | ICD-10-CM

## 2018-07-12 LAB — RESPIRATORY ALLERGY PROFILE REGION II ~~LOC~~
ASPERGILLUS FUMIGATUS M3: 8.09 kU/L — AB
Allergen, A. alternata, m6: 0.58 kU/L — ABNORMAL HIGH
Allergen, D pternoyssinus,d7: 0.16 kU/L — ABNORMAL HIGH
Allergen, Mouse Urine Protein, e78: <0.1 kU/L
Allergen, Oak,t7: <0.1 kU/L
Allergen, P. notatum, m1: 1.23 kU/L — ABNORMAL HIGH
Bermuda Grass: 0.36 kU/L — ABNORMAL HIGH
Box Elder IgE: <0.1 kU/L
CLADOSPORIUM HERBARUM (M2) IGE: 0.16 kU/L — AB
CLASS: 0
CLASS: 0
CLASS: 0
CLASS: 0
CLASS: 0
CLASS: 0
CLASS: 0
CLASS: 0
CLASS: 0
CLASS: 1
CLASS: 1
CLASS: 2
Class: 0
Class: 0
Class: 0
Class: 0
Class: 0
Class: 0
Class: 0
Class: 0
Class: 1
Class: 1
Class: 2
Class: 3
Cockroach: <0.1 kU/L
D. farinae: 0.37 kU/L — ABNORMAL HIGH
Elm IgE: <0.1 kU/L
IgE (Immunoglobulin E), Serum: 652 kU/L — ABNORMAL HIGH (ref <=114)
Johnson Grass: 0.39 kU/L — ABNORMAL HIGH
Sheep Sorrel IgE: <0.1 kU/L
Timothy Grass: 3.45 kU/L — ABNORMAL HIGH

## 2018-07-12 LAB — ALPHA-1 ANTITRYPSIN PHENOTYPE: A-1 Antitrypsin, Ser: 171 mg/dL (ref 83–199)

## 2018-07-12 LAB — IGG, IGA, IGM
IgG (Immunoglobin G), Serum: 737 mg/dL (ref 600–1540)
IgM, Serum: 50 mg/dL (ref 50–300)
Immunoglobulin A: 282 mg/dL (ref 20–320)

## 2018-07-12 LAB — INTERPRETATION:

## 2018-07-13 NOTE — Progress Notes (Signed)
Spoke with pt and notified of results per Dr. Wert. Pt verbalized understanding and denied any questions. 

## 2018-07-18 ENCOUNTER — Other Ambulatory Visit: Payer: Self-pay | Admitting: Internal Medicine

## 2018-07-18 ENCOUNTER — Ambulatory Visit
Admission: RE | Admit: 2018-07-18 | Discharge: 2018-07-18 | Disposition: A | Discharge status: Home / Self Care | Payer: Medicare Other | Source: Ambulatory Visit | Attending: Internal Medicine | Admitting: Internal Medicine

## 2018-07-18 DIAGNOSIS — R059 Cough, unspecified: Secondary | ICD-10-CM

## 2018-07-18 DIAGNOSIS — R05 Cough: Secondary | ICD-10-CM

## 2018-07-18 DIAGNOSIS — J479 Bronchiectasis, uncomplicated: Secondary | ICD-10-CM

## 2018-07-18 DIAGNOSIS — J329 Chronic sinusitis, unspecified: Secondary | ICD-10-CM | POA: Diagnosis not present

## 2018-07-18 DIAGNOSIS — J310 Chronic rhinitis: Secondary | ICD-10-CM

## 2018-07-18 NOTE — Progress Notes (Signed)
Spoke with pt and notified of results per Dr. Wert. Pt verbalized understanding and denied any questions. 

## 2018-07-24 ENCOUNTER — Other Ambulatory Visit: Payer: Self-pay | Admitting: Family Medicine

## 2018-07-31 DIAGNOSIS — J320 Chronic maxillary sinusitis: Secondary | ICD-10-CM | POA: Insufficient documentation

## 2018-07-31 DIAGNOSIS — J342 Deviated nasal septum: Secondary | ICD-10-CM | POA: Insufficient documentation

## 2018-07-31 DIAGNOSIS — J322 Chronic ethmoidal sinusitis: Secondary | ICD-10-CM | POA: Diagnosis not present

## 2018-07-31 DIAGNOSIS — J310 Chronic rhinitis: Secondary | ICD-10-CM | POA: Diagnosis not present

## 2018-07-31 DIAGNOSIS — J321 Chronic frontal sinusitis: Secondary | ICD-10-CM | POA: Insufficient documentation

## 2018-08-01 ENCOUNTER — Other Ambulatory Visit: Payer: Self-pay | Admitting: Family Medicine

## 2018-08-10 ENCOUNTER — Other Ambulatory Visit: Payer: Self-pay | Admitting: Family Medicine

## 2018-08-13 ENCOUNTER — Ambulatory Visit: Payer: Medicare Other | Admitting: Internal Medicine

## 2018-08-13 ENCOUNTER — Ambulatory Visit (INDEPENDENT_AMBULATORY_CARE_PROVIDER_SITE_OTHER): Payer: Medicare Other | Admitting: Internal Medicine

## 2018-08-13 VITALS — BP 140/80 | HR 80

## 2018-08-13 DIAGNOSIS — J479 Bronchiectasis, uncomplicated: Secondary | ICD-10-CM | POA: Diagnosis not present

## 2018-08-13 DIAGNOSIS — J4540 Moderate persistent asthma, uncomplicated: Secondary | ICD-10-CM | POA: Diagnosis not present

## 2018-08-13 LAB — PULMONARY FUNCTION TEST
DL/VA % pred: 108 %
DL/VA: 4.89 ml/min/mmHg/L
DLCO COR % PRED: 74 %
DLCO UNC: 23 ml/min/mmHg
DLCO cor: 22.69 ml/min/mmHg
DLCO unc % pred: 75 %
FEF 25-75 PRE: 0.93 L/s
FEF2575-%Pred-Pre: 42 %
FEV1-%PRED-PRE: 63 %
FEV1-PRE: 1.85 L
FEV1FVC-%Pred-Pre: 84 %
FEV6-%Pred-Pre: 76 %
FEV6-Pre: 2.91 L
FEV6FVC-%PRED-PRE: 104 %
FVC-%Pred-Pre: 73 %
FVC-Pre: 2.99 L
Pre FEV1/FVC ratio: 62 %
Pre FEV6/FVC Ratio: 98 %
RV % PRED: 120 %
RV: 2.93 L
TLC % pred: 88 %
TLC: 5.95 L

## 2018-08-13 NOTE — Progress Notes (Signed)
Mario [name], male    DOB: [birth year], 72 y.o.   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 with def correlation with his symptoms  but resolved by age 67 then recurred while smoking in 1976 and seemed to get worse while in [place]> pulmonary eval rx and happy with results but on daily meds ever since moved to [place] in 1991 and saw [place] allergy in 2000 for placed on shots x maybe a year but no change in maint therapy rx which seemed to help but freq flares changed from symbicort/spiriva  to trelegy then much worse while on trelegy which was around 03/2018 with cough/ wheeze>  On his own switched back to symbicort/spiriva around 06/25/18 and referred to pulmonary [name] 07/05/2018 by Dr   Dennard Schaumann.    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 Pulmonary Consult    Referred by [name]. Pt c/o cough and SOB x 3 months. He is coughing up clear sputum, esp worse in the am. He uses proair 1-2 x daily on average and has neb with albuterol that he rarely uses.   has sensation "lungs full of congestion" but doesn't cough much up and what he does is clear  cpap helps at hs / sleeps  on R side and bed is flat/ on left side side down wheezing keeps him up even on cpap Last pred 10 days prior to OV  And not better whereas previously worked great. Finished the abx about the time of the last ct 06/26/18  rec Bronchiectasis =   you have scarring of your bronchial tubes which means that they don't function perfectly normally and mucus tends to pool in certain areas of your lung which can cause pneumonia and further scarring of your lung and bronchial tubes Whenever you develop cough congestion take mucinex or mucinex dm > these will help keep the mucus loose and flowing but  if your condition worsens you need to seek help immediately preferably here or somewhere inside the [facility] system to compare xrays ( worse = darker or bloody mucus or pain on breathing in)   Use the flutter valve as much as possible Protonix 40 mg Take 30- 60 min before your first and last meals of the day  GERD (REFLUX)    schedule sinus CT > pos > ENT eval req Please schedule a follow up office visit in 4 weeks, sooner if needed with PFTs on return    08/13/2018  f/u ov/Mario [name] re: chronic asthma vs ACOS vs obst bronchiectasis  Chief Complaint  Patient presents with  . Follow-up    Pt states he feels like he is only some better since last visit. Pt has c/o SOB with exertion, cough with clear mucus, occ. chest tightness.   Dyspnea:  MMRC1 = can walk nl pace, flat grade, can't hurry or go uphills or steps s sob   Cough: since 08/10/18 was better p ent rx, some clear mucus now   Sleeping: ok on 3 inch blocks SABA use: albuterol hs only  02: none     No obvious day to day or daytime variability or assoc excess/ purulent sputum or mucus plugs or hemoptysis or cp or chest tightness, subjective wheeze or  overt sinus or hb symptoms.   Sleeping as above  without nocturnal  or early am exacerbation  of respiratory  c/o's or need for noct saba. Also denies any obvious fluctuation of symptoms with weather or environmental changes or other aggravating or alleviating factors except as outlined above   No unusual exposure hx or h/o childhood pna/ asthma or knowledge of premature birth.  Current Allergies, Complete Past Medical History, Past Surgical History, Family History, and Social History were reviewed in [employer] record.  ROS  The following are not active complaints unless bolded Hoarseness, sore throat, dysphagia, dental problems, itching, sneezing,  nasal congestion or discharge of excess mucus or purulent secretions, ear ache,   fever, chills, sweats, unintended wt loss or  wt gain, classically pleuritic or exertional cp,  orthopnea pnd or arm/hand swelling  or leg swelling, presyncope, palpitations, abdominal pain, anorexia, nausea, vomiting, diarrhea  or change in bowel habits or change in bladder habits, change in stools or change in urine, dysuria, hematuria,  rash, arthralgias, visual complaints, headache, numbness, weakness or ataxia or problems with walking or coordination,  change in mood or  memory.        Current Meds  Medication Sig  . albuterol (PROVENTIL HFA;VENTOLIN HFA) 108 (90 Base) MCG/ACT inhaler Inhale 2 puffs into the lungs every 6 (six) hours as needed for wheezing or shortness of breath.  Marland Kitchen albuterol (PROVENTIL) (2.5 MG/3ML) 0.083% nebulizer solution Take 3 mLs (2.5 mg total) by nebulization every 6 (six) hours as needed for wheezing or shortness of breath.  Marland Kitchen amoxicillin-clavulanate (AUGMENTIN) 875-125 MG tablet Take by mouth.  Marland Kitchen aspirin EC 81 MG tablet Take 81 mg by mouth daily.  Marland Kitchen atorvastatin (LIPITOR) 40 MG tablet TAKE 1 TABLET BY MOUTH EVERY DAY  . Cholecalciferol (VITAMIN D PO) Take 5,000 Units by mouth.   . FIBER PO Take by mouth.  . fluticasone (FLONASE) 50 MCG/ACT nasal spray Place into the nose.  Marland Kitchen glucosamine-chondroitin 500-400 MG tablet Take 1 tablet by mouth 3 (three) times daily.  Marland Kitchen losartan-hydrochlorothiazide (HYZAAR) 100-25 MG tablet Take 1 tablet by mouth daily.  . montelukast (SINGULAIR) 10 MG tablet Take 1 tablet (10 mg total) by mouth daily.  . pantoprazole (PROTONIX) 40 MG tablet TAKE 1 TABLET BY MOUTH TWICE A DAY  . Respiratory Therapy Supplies (FLUTTER) DEVI Use as directed  . SPIRIVA RESPIMAT 2.5 MCG/ACT AERS INHALE 2 INHALERS INTO THE LUNGS DAILY.  . SYMBICORT 160-4.5 MCG/ACT inhaler INHALE 2 PUFFS INTO THE LUNGS 2 TIMES DAILY.  Marland Kitchen UNABLE TO FIND Med Name: clear lungs otc 4 x daily                        Objective:    amb wm nad  Vital signs reviewed - Note on arrival 02 sats  92% on RA      08/13/2018       218   07/05/18 224 lb (101.6 kg)  06/12/18 230 lb (104.3 kg)  05/25/18 233 lb (105.7 kg)        HEENT: nl dentition / oropharynx. Nl external ear canals without cough reflex -  Mild bilateral non-specific turbinate edema     NECK :  without JVD/Nodes/TM/ nl carotid upstrokes bilaterally   LUNGS: no acc muscle use,  Mild barrel  contour chest wall with bilateral  Distant bs s audible wheeze and  without cough on insp or exp maneuver and mild  Hyperresonant  to  percussion bilaterally     CV:  RRR  no s3 or murmur or increase in P2, and no edema   ABD:  soft and nontender with pos late  insp Hoover's  in the supine position. No bruits or organomegaly appreciated, bowel sounds nl  MS:   Nl gait/  ext warm without deformities, calf tenderness, cyanosis or clubbing No obvious joint restrictions   SKIN: warm and dry without lesions    NEURO:  alert, approp, nl sensorium with  no motor or cerebellar deficits apparent.            I personally reviewed images and agree with radiology impression as follows:   Chest CT w/o contrast 06/26/18 1. Ground-glass nodularity in the bases, left greater than right. There is also some scattered ground-glass and nodularity in the bilateral upper lobes, less prominent than the lower lobes. These findings are consistent with an infectious or inflammatory process. 2. There is a 5.5 x 10.5 x 5.8 mm nodule in the right apex not seen in July of 2018. This could represent an infectious process or mucous plugging given the other findings. A new neoplasm is not excluded. Recommend a short-term follow-up chest CT after treatment to ensure resolution. 3. Bronchiectasis in the right middle lobe and bases. 4. Coronary artery calcifications. Mild atherosclerotic change in the thoracic aorta. 5. Moderate hiatal hernia.          Assessment

## 2018-08-13 NOTE — Progress Notes (Signed)
PFT done today. 

## 2018-08-13 NOTE — Patient Instructions (Addendum)
Plan A = Automatic = symbiocort 160 x 2 and spiriva x 2 pffs  first thing in am  then 12 hours later Symbiort x 160 mg  Work on inhaler technique:  relax and gently blow all the way out then take a nice smooth deep breath back in, triggering the inhaler at same time you start breathing in.  Hold for up to 5 seconds if you can. Blow symbicort out thru nose. Rinse and gargle with water when done   Plan B = Backup Only use your albuterol as a rescue medication to be used if you can't catch your breath by resting or doing a relaxed purse lip breathing pattern.  - The less you use it, the better it will work when you need it. - Ok to use the inhaler up to 2 puffs  every 4 hours if you must but call for appointment if use goes up over your usual need - Don't leave home without it !!  (think of it like the spare tire for your car)   Plan C = Crisis - only use your albuterol nebulizer if you first try Plan B and it fails to help > ok to use the nebulizer up to every 4 hours but if start needing it regularly call for immediate appointment    Please schedule a follow up office visit in 6 weeks, call sooner if needed with all medications /inhalers/ solutions in hand so we can verify exactly what you are taking. This includes all medications from all doctors and over the [place] separate them into two bags:  the ones you take automatically, no matter what, vs the ones you take just when you feel you need them "BAG #2 is UP TO YOU"  - this will really help [place] help you take your medications more effectively.  - late add set up f/u HRCT  Prior to next ov

## 2018-08-14 ENCOUNTER — Telehealth: Payer: Self-pay | Admitting: Internal Medicine

## 2018-08-14 ENCOUNTER — Encounter: Payer: Self-pay | Admitting: Internal Medicine

## 2018-08-14 DIAGNOSIS — J471 Bronchiectasis with (acute) exacerbation: Secondary | ICD-10-CM

## 2018-08-14 DIAGNOSIS — J479 Bronchiectasis, uncomplicated: Secondary | ICD-10-CM

## 2018-08-14 NOTE — Assessment & Plan Note (Signed)
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  Both mastoid sinuses are clear. Mild to moderate nasal septal deviation.  Improved overall with rx of sinus dz / no changes at this point, will need hrct at f/u in Oct 2019 as he does have non-specific changes some of which could represent ild or even occult ca per last CT reviewed today from 7/91/9 with 3 m f/u rec by radiology  Discussed in detail all the  indications, usual  risks and alternatives  relative to the benefits with patient who agrees to proceed with   f/u as outlined

## 2018-08-14 NOTE — Assessment & Plan Note (Signed)
PFT's  08/13/2018  FEV1 1.85 (63 % ) ratio 62   p symb 160/spiriva  And saba   prior to study with DLCO  75/74c % corrects to 108  % for alv volume    Not using inhalers as instructed in terms of timing and not able to do pre/post and used saba prior for reasons not clear   The proper method of use, as well as anticipated side effects, of a metered-dose inhaler are discussed and demonstrated to the patient. Rule of 2s also reviewed  F/u in 6 weeks with all meds in hand using a trust but verify approach to confirm accurate Medication  Reconciliation The principal here is that until we are certain that the  patients are doing what we've asked, it makes no sense to ask them to do more.    I had an extended discussion with the patient reviewing all relevant studies completed to date and  lasting 15 to 20 minutes of a 25 minute visit    See device teaching which extended face to face time for this visit.  Each maintenance medication was reviewed in detail including emphasizing most importantly the difference between maintenance and prns and under what circumstances the prns are to be triggered using an action plan format that is not reflected in the computer generated alphabetically organized AVS which I have not found useful in most complex patients, especially with respiratory illnesses  Please see AVS for specific instructions unique to this visit that I personally wrote and verbalized to the the pt in detail and then reviewed with pt  by my nurse highlighting any  changes in therapy recommended at today's visit to their plan of care.

## 2018-08-21 DIAGNOSIS — J322 Chronic ethmoidal sinusitis: Secondary | ICD-10-CM | POA: Diagnosis not present

## 2018-08-21 DIAGNOSIS — R053 Chronic cough: Secondary | ICD-10-CM | POA: Insufficient documentation

## 2018-08-21 DIAGNOSIS — J342 Deviated nasal septum: Secondary | ICD-10-CM | POA: Diagnosis not present

## 2018-08-21 DIAGNOSIS — J320 Chronic maxillary sinusitis: Secondary | ICD-10-CM | POA: Diagnosis not present

## 2018-08-21 DIAGNOSIS — J329 Chronic sinusitis, unspecified: Secondary | ICD-10-CM | POA: Diagnosis not present

## 2018-08-21 DIAGNOSIS — R05 Cough: Secondary | ICD-10-CM | POA: Insufficient documentation

## 2018-08-21 DIAGNOSIS — J310 Chronic rhinitis: Secondary | ICD-10-CM | POA: Diagnosis not present

## 2018-08-21 DIAGNOSIS — J321 Chronic frontal sinusitis: Secondary | ICD-10-CM | POA: Diagnosis not present

## 2018-08-30 DIAGNOSIS — M170 Bilateral primary osteoarthritis of knee: Secondary | ICD-10-CM | POA: Diagnosis not present

## 2018-08-31 ENCOUNTER — Other Ambulatory Visit: Payer: Self-pay | Admitting: Family Medicine

## 2018-08-31 MED ORDER — MONTELUKAST SODIUM 10 MG PO TABS: 10.0000 mg | ORAL_TABLET | Freq: Every day | ORAL | 1 refills | Status: DC

## 2018-09-04 NOTE — Telephone Encounter (Signed)
error 

## 2018-09-10 ENCOUNTER — Other Ambulatory Visit: Payer: Self-pay | Admitting: Internal Medicine

## 2018-09-10 DIAGNOSIS — J471 Bronchiectasis with (acute) exacerbation: Secondary | ICD-10-CM

## 2018-09-10 DIAGNOSIS — J479 Bronchiectasis, uncomplicated: Secondary | ICD-10-CM

## 2018-09-19 ENCOUNTER — Ambulatory Visit
Admission: RE | Admit: 2018-09-19 | Discharge: 2018-09-19 | Disposition: A | Discharge status: Home / Self Care | Payer: Medicare Other | Source: Ambulatory Visit | Attending: Internal Medicine | Admitting: Internal Medicine

## 2018-09-19 DIAGNOSIS — R911 Solitary pulmonary nodule: Secondary | ICD-10-CM | POA: Diagnosis not present

## 2018-09-19 DIAGNOSIS — J479 Bronchiectasis, uncomplicated: Secondary | ICD-10-CM

## 2018-09-19 DIAGNOSIS — J471 Bronchiectasis with (acute) exacerbation: Secondary | ICD-10-CM

## 2018-09-19 IMAGING — CT CT CHEST HIGH RESOLUTION W/O CM
2 of 7 series · 14 of 36 positions shown, 17 images · non-contrast
Comparison: [DATE] chest CT.

CLINICAL DATA: Bronchiectasis. Acute exacerbation. Productive
cough. Former smoker.

EXAM:
CT CHEST WITHOUT CONTRAST
TECHNIQUE: Multidetector CT imaging of the chest was performed following the
standard protocol without intravenous contrast. High resolution
imaging of the lungs, as well as inspiratory and expiratory imaging,
was performed.

[Series 4: inspiration hi-res 2.00 br36 s3 cor · coronal · 0.65mm/px · 3 of 216 slices shown]
[im 44/216  lung]
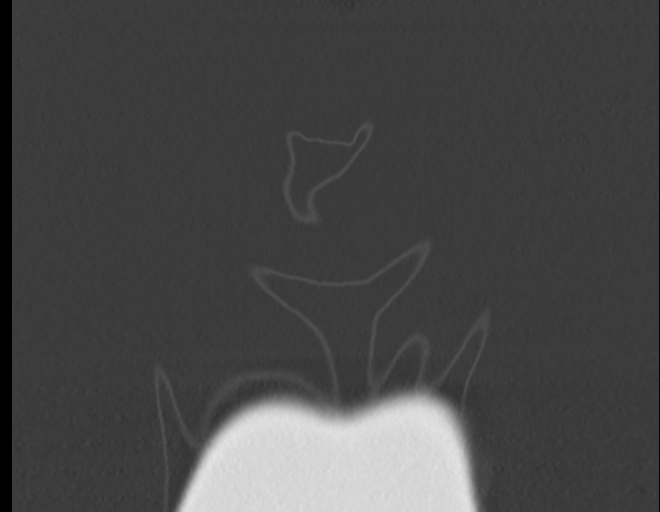
[im 87/216  lung]
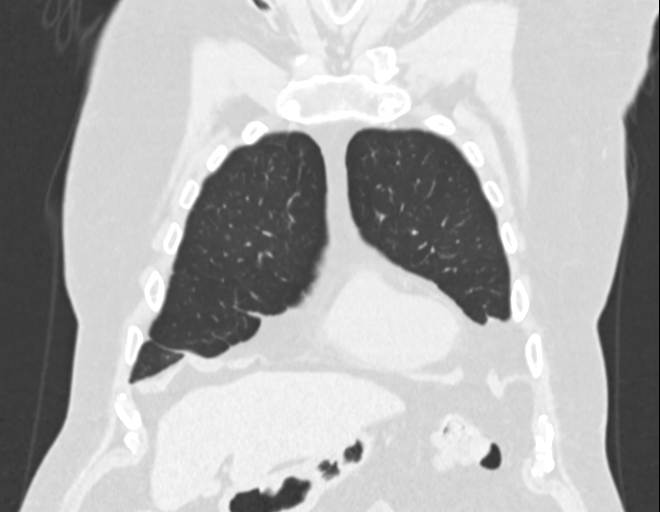
[im 130/216  lung]
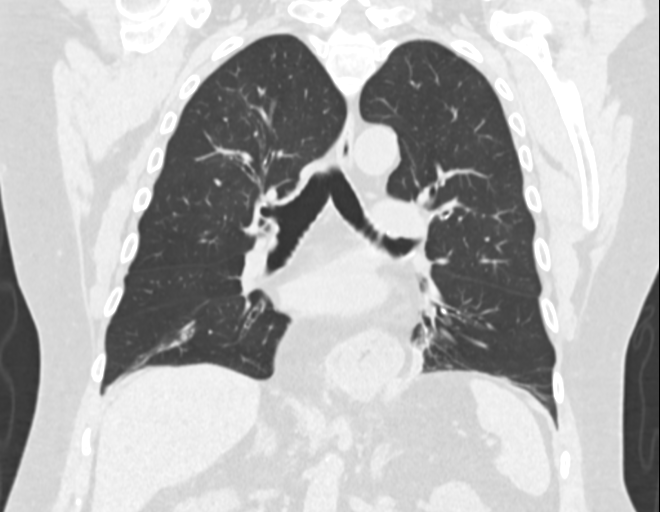

[Series 11: inspiration hi-res 1.00 br60 s3 ax high res thins  · axial · 0.84mm/px · z∈[+1538,+1821]mm · 11 of 335 slices shown, 14 images]
[im 26/335  mediastinal]
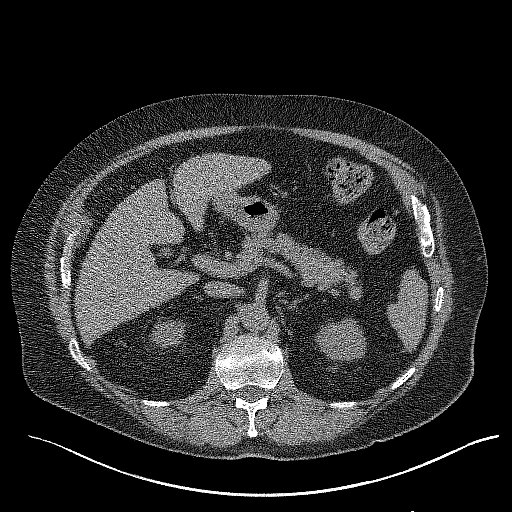
[im 26/335  lung]
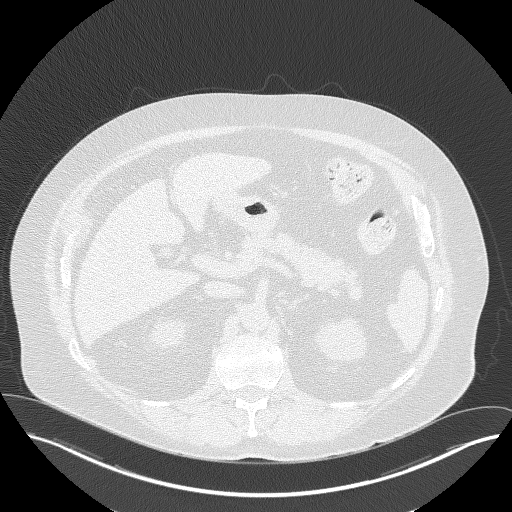
[im 52/335  lung]
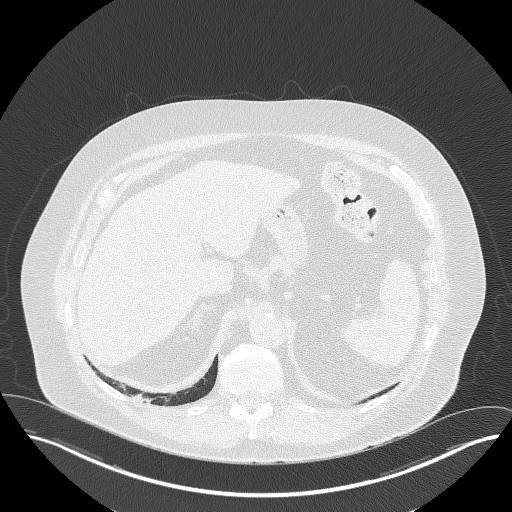
[im 78/335  lung]
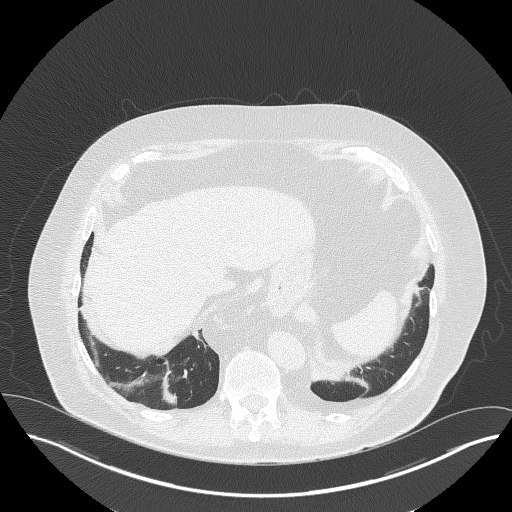
[im 103/335  lung]
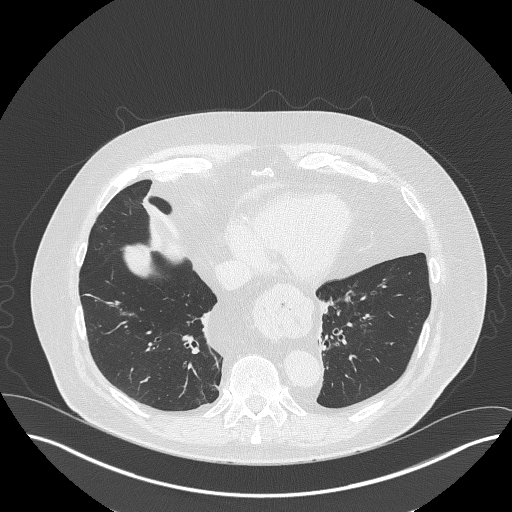
[im 129/335  mediastinal]
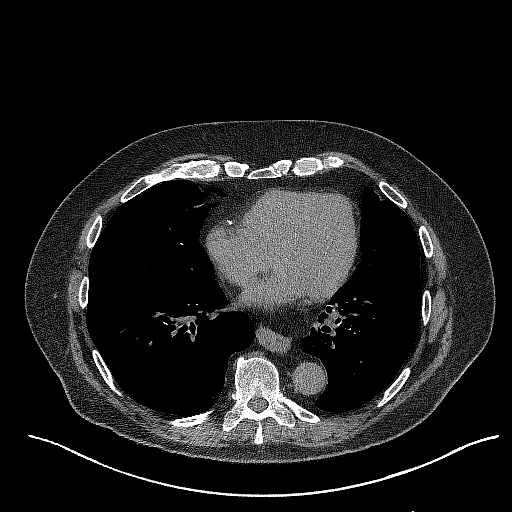
[im 129/335  lung]
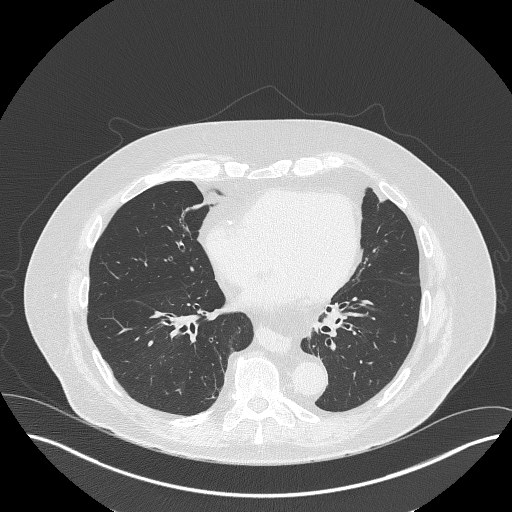
[im 180/335  lung]
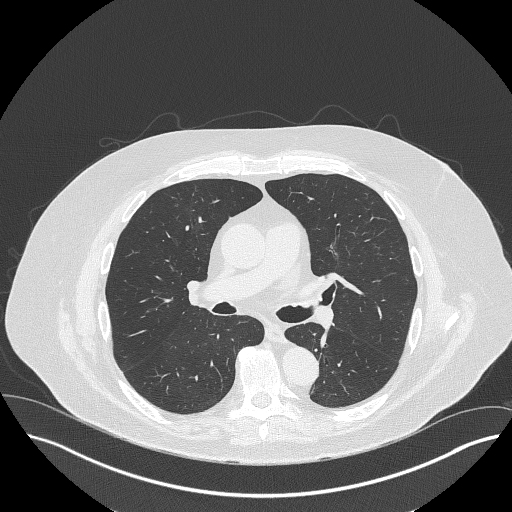
[im 206/335  lung]
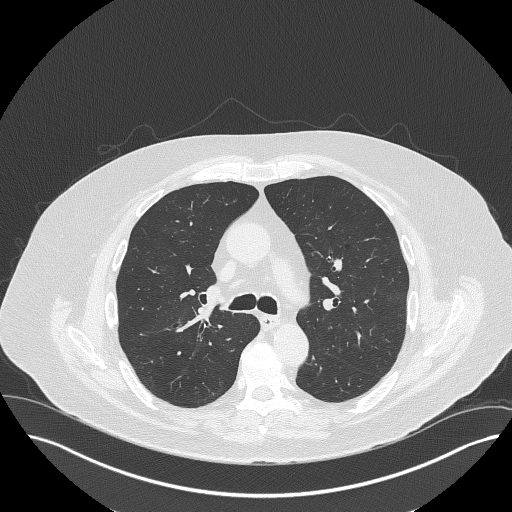
[im 232/335  lung]
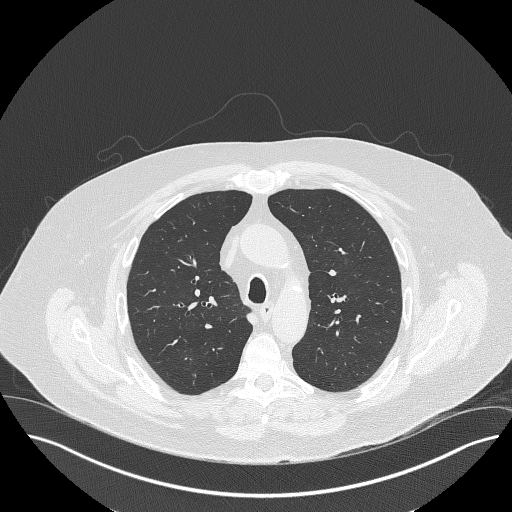
[im 257/335  mediastinal]
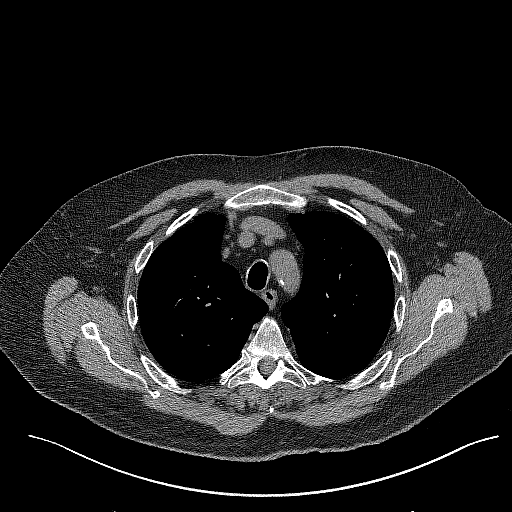
[im 257/335  lung]
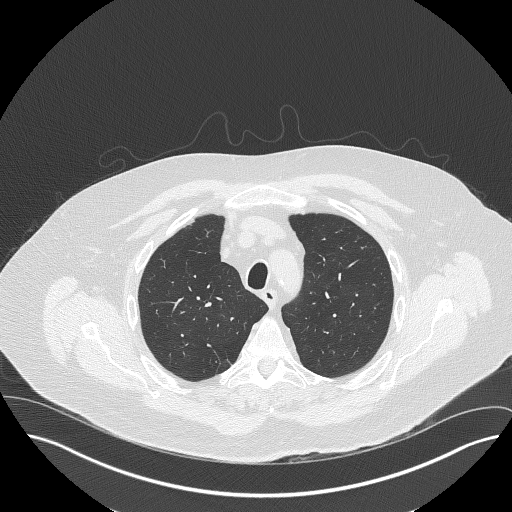
[im 283/335  lung]
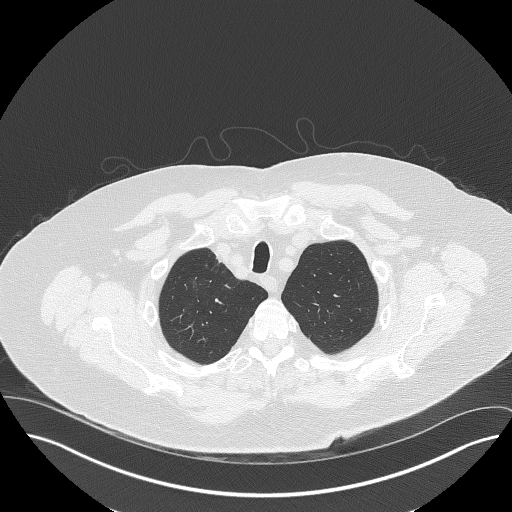
[im 309/335  lung]
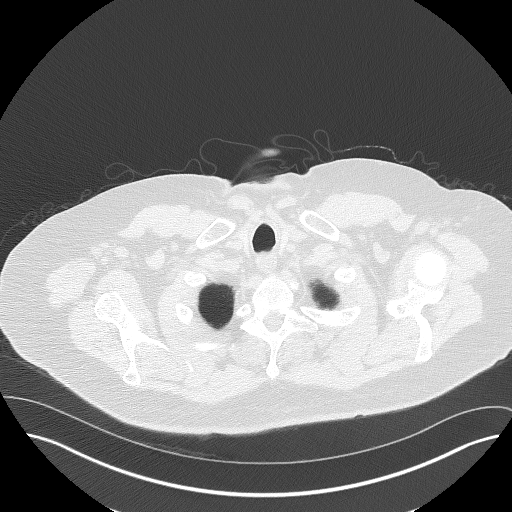

[14 of 36 positions shown; findings below may reference images not displayed]

FINDINGS: Cardiovascular: Normal heart size. No significant pericardial
effusion/thickening. Three-vessel coronary atherosclerosis.
Atherosclerotic nonaneurysmal thoracic aorta. Normal caliber
pulmonary arteries.

Mediastinum/Nodes: No discrete thyroid nodules. Unremarkable
esophagus. No pathologically enlarged axillary, mediastinal or hilar
lymph nodes, noting limited sensitivity for the detection of hilar
adenopathy on this noncontrast study.

Lungs/Pleura: No pneumothorax. No pleural effusion. No acute
consolidative airspace disease or lung masses. A few scattered solid
pulmonary nodules in the right lung measuring up to 5 mm in the
apical right upper lobe (series 8/image 24), all stable since
[DATE], noting that the 5 mm apical right upper lobe nodule was
new since [DATE] chest CT as previously described. No new
significant pulmonary nodules. There is mild cylindrical and
varicoid bronchiectasis scattered in both lungs, predominantly
involving the medial segment right middle lobe, lingula and medial
lower lobes bilaterally, not appreciably changed. Associated mild
bronchial wall thickening at the areas of bronchiectasis is slightly
improved in the interval. Scattered parenchymal bands in the mid to
lower lungs bilaterally are stable and compatible with
postinfectious/postinflammatory scarring. No significant regions of
tree-in-bud opacity, subpleural reticulation, ground-glass
attenuation or frank honeycombing. No significant air trapping on
the expiration sequence.

Upper abdomen: Moderate hiatal hernia.  Cholelithiasis.

Musculoskeletal: No aggressive appearing focal osseous lesions. Mild
thoracic spondylosis.
IMPRESSION: 1. Mild cylindrical and varicoid bronchiectasis in the mid to lower
lungs, unchanged. Associated mild bronchial wall thickening is
slightly improved.
2. Stable scattered parenchymal bands in the mid to lower lungs,
compatible with postinfectious/postinflammatory scarring.
3. Two month stability of 5 mm apical right upper lobe solid
pulmonary nodule, more likely benign. Suggest follow-up chest CT in
6-12 months given the history of smoking and given that this nodule
was previously seen to be new compared to [DATE] chest CT.
4. Three-vessel coronary atherosclerosis.
5. Moderate hiatal hernia.
6. Cholelithiasis.

Aortic Atherosclerosis ([X5]-[X5]).

## 2018-09-21 ENCOUNTER — Inpatient Hospital Stay: Admission: RE | Admit: 2018-09-21 | Payer: Medicare Other | Source: Ambulatory Visit

## 2018-09-24 ENCOUNTER — Encounter: Payer: Self-pay | Admitting: Internal Medicine

## 2018-09-24 ENCOUNTER — Ambulatory Visit: Payer: Medicare Other | Admitting: Internal Medicine

## 2018-09-24 VITALS — BP 126/78 | HR 65 | Ht 69.0 in | Wt 212.4 lb

## 2018-09-24 DIAGNOSIS — J4540 Moderate persistent asthma, uncomplicated: Secondary | ICD-10-CM

## 2018-09-24 DIAGNOSIS — J479 Bronchiectasis, uncomplicated: Secondary | ICD-10-CM | POA: Diagnosis not present

## 2018-09-24 MED ORDER — DOXYCYCLINE HYCLATE 100 MG PO TABS: ORAL_TABLET | ORAL | 11 refills | Status: DC

## 2018-09-24 NOTE — Patient Instructions (Addendum)
Be sure to take the am dose dose of symbicort 160 x 2 first thing in am and leave off spiriva (think of it like high octane fuel)    For thick mucus/ cough / congestion > mucinex dm up to 1200 mg every 12 hours with glass of water and the flutter valve as much as possible    For nasty mucus >  Doxycyline 100 mg twice daily x 10 day course (refillable so keep on file)    Please schedule a follow up visit in 3 months but call sooner if needed

## 2018-09-24 NOTE — Progress Notes (Signed)
Mario [name], male    DOB: September 10, 1946, 72 y.o.   MRN: [id number]   Brief patient profile:  72 yowm quit smoking in 1976 reports during  teens in [place] cough/ wheeze and several admits [facility] dx allergy to grass/ dust with def correlation with his symptoms  but resolved by age 27 then recurred while smoking in 1976 and seemed to get worse while in [place]> pulmonary eval rx and happy with results but on daily meds ever since moved to [place] in 1991 and saw [place] [facility] in 2000 for placed on shots x maybe a year but no change in maint therapy rx which seemed to help but freq flares changed from symbicort/spiriva  to trelegy then much worse while on trelegy which was around 03/2018 with cough/ wheeze>  On his own switched back to symbicort/spiriva around 06/25/18 and referred to pulmonary clinic 07/05/2018 by Dr   Dennard Schaumann.    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 Pulmonary Consult    Referred by [name]. Pt c/o cough and SOB x 3 months. He is coughing up clear sputum, esp worse in the am. He uses proair 1-2 x daily on average and has neb with albuterol that he rarely uses.   has sensation "lungs full of congestion" but doesn't cough much up and what he does is clear  cpap helps at hs / sleeps  on R side and bed is flat/ on left side side down wheezing keeps him up even on cpap Last pred 10 days prior to OV  And not better whereas previously worked great. Finished the abx about the time of the last ct 06/26/18  rec Bronchiectasis =   you have scarring of your bronchial tubes which means that they don't function perfectly normally and mucus tends to pool in certain areas of your lung which can cause pneumonia and further scarring of your lung and bronchial tubes Whenever you develop cough congestion take mucinex or mucinex dm > these will help keep the mucus loose and flowing but  if your condition worsens you need to seek help immediately preferably here or somewhere inside the [facility] system to compare xrays ( worse = darker or bloody mucus or pain on breathing in)   Use the flutter valve as much as possible Protonix 40 mg Take 30- 60 min before your first and last meals of the day  GERD (REFLUX)    schedule sinus CT > pos > ENT eval req Please schedule a follow up office visit in 4 weeks, sooner if needed with PFTs on return    08/13/2018  f/u ov/[name] re: chronic asthma vs ACOS vs obst bronchiectasis  Chief Complaint  Patient presents with  . Follow-up    Pt states he feels like he is only some better since last visit. Pt has c/o SOB with exertion, cough with clear mucus, occ. chest tightness.  Dyspnea:  MMRC1 = can walk nl pace, flat grade, can't hurry or go uphills or steps s sob   Cough: since 08/10/18 was better p ent rx, some clear mucus now   Sleeping: ok on 3 inch blocks SABA use: albuterol hs only  rec Plan A = Automatic = symbiocort 160 x 2 and spiriva x 2 pffs  first thing in am  then 12 hours later Symbiort x 160 mg Work on inhaler technique:  Plan B = Backup Only use your albuterol as a rescue medication Plan C = Crisis - only use your albuterol nebulizer if you first try Plan B and it fails to help > ok to use the nebulizer up to every 4 hours but if start needing it regularly call for immediate appointment     09/24/2018  f/u ov/[name] re: chronic asthma vs acos vs obst obstriectasis assoc with chronic rhinitis/sinusitis under ent care Chief Complaint  Patient presents with  . Follow-up    Cough has improved some. He states his breathing is doing well. He has used his albuterol inhaler x 1 since the last visit. He rarely uses neb.   Dyspnea:  Walks 30 min on a track  Better and etter ex tol  Cough: better then worse x 2 weeks esp p stirs  But not using symb first thing  Sleeping: fine on 3 in blocks  SABA use: only used once as hfa/ not neb  02:  no 02   No obvious day to day or daytime variability or assoc excess/ purulent sputum or mucus plugs or hemoptysis or cp or chest tightness, subjective wheeze or overt sinus or hb symptoms.   Sleeping as above  without nocturnal  or early am exacerbation  of respiratory  c/o's or need for noct saba. Also denies any obvious fluctuation of symptoms with weather or environmental changes or other aggravating or alleviating factors except as outlined above   No unusual exposure hx or h/o childhood pna/ asthma or knowledge of premature birth.  Current Allergies, Complete Past Medical History, Past Surgical History, Family History, and Social History were reviewed in [employer] record.  ROS  The following are not active complaints unless bolded Hoarseness, sore throat, dysphagia, dental problems, itching, sneezing,  nasal congestion or discharge of excess mucus or purulent secretions, ear ache,   fever, chills, sweats, unintended wt loss or wt gain, classically pleuritic or exertional cp,  orthopnea pnd or arm/hand swelling  or leg swelling, presyncope, palpitations, abdominal pain, anorexia, nausea, vomiting, diarrhea  or change in bowel habits or change in bladder habits, change in stools or change in urine, dysuria, hematuria,  rash, arthralgias, visual complaints, headache, numbness, weakness or ataxia or problems with walking or coordination,  change in mood or  memory.        Current Meds  Medication Sig  . albuterol (PROVENTIL HFA;VENTOLIN HFA) 108 (90 Base) MCG/ACT inhaler Inhale 2 puffs into the lungs every 6 (six) hours as needed for wheezing or shortness of breath.  Marland Kitchen albuterol (PROVENTIL) (2.5 MG/3ML) 0.083% nebulizer solution Take 3 mLs (2.5 mg total) by nebulization every 6 (six) hours as needed for wheezing or shortness of breath.  Marland Kitchen aspirin EC 81 MG tablet Take 81 mg by mouth daily.  Marland Kitchen atorvastatin (LIPITOR) 40 MG tablet TAKE 1 TABLET BY MOUTH EVERY DAY  .  Coenzyme Q10 (CO Q-10) 100 MG CAPS Take 1 capsule by mouth daily.  . fluticasone (FLONASE) 50 MCG/ACT nasal spray Place into the nose.  Marland Kitchen glucosamine-chondroitin 500-400 MG tablet Take 1 tablet by mouth 3 (three) times daily.  Marland Kitchen guaiFENesin (MUCINEX) 600 MG 12 hr tablet Take 1,200 mg by mouth 2 (two) times daily as needed.  . Hypertonic Nasal Wash (SINUS RINSE BOTTLE KIT NA) Place into the nose 2 (two) times daily.  Marland Kitchen losartan-hydrochlorothiazide (HYZAAR) 100-25 MG tablet Take 1 tablet by mouth daily.  . montelukast (SINGULAIR) 10 MG tablet Take 1 tablet (10  mg total) by mouth daily.  . pantoprazole (PROTONIX) 40 MG tablet TAKE 1 TABLET BY MOUTH TWICE A DAY  . Psyllium 0.36 g CAPS Take 1 capsule by mouth daily.  Marland Kitchen Respiratory Therapy Supplies (FLUTTER) DEVI Use as directed  . SYMBICORT 160-4.5 MCG/ACT inhaler INHALE 2 PUFFS INTO THE LUNGS 2 TIMES DAILY.  Marland Kitchen therapeutic multivitamin-minerals (THERAGRAN-M) tablet Take 1 tablet by mouth daily.  Marland Kitchen UNABLE TO FIND Med Name: clear lungs otc 4 x daily  . [  SPIRIVA RESPIMAT 2.5 MCG/ACT AERS INHALE 2 INHALERS INTO THE LUNGS DAILY.                 Objective:       amb min hoarse wm nad   Vital signs reviewed - Note on arrival 02 sats  95% on RA     09/24/2018       212  08/13/2018       218   07/05/18 224 lb (101.6 kg)  06/12/18 230 lb (104.3 kg)  05/25/18 233 lb (105.7 kg)        HEENT: nl dentition / oropharynx. Nl external ear canals without cough reflex -  Mild bilateral non-specific turbinate edema     NECK :  without JVD/Nodes/TM/ nl carotid upstrokes bilaterally   LUNGS: no acc muscle use,  Mild barrel  contour chest wall with bilateral  Distant bs s audible wheeze and  without cough on insp or exp maneuver and mild  Hyperresonant  to  percussion bilaterally     CV:  RRR  no s3 or murmur or increase in P2, and no edema   ABD: Obese/ soft and nontender with pos late insp Hoover's  in the supine position. No bruits or  organomegaly appreciated, bowel sounds nl  MS:   Nl gait/  ext warm without deformities, calf tenderness, cyanosis or clubbing No obvious joint restrictions   SKIN: warm and dry without lesions    NEURO:  alert, approp, nl sensorium with  no motor or cerebellar deficits apparent.                 Assessment

## 2018-09-24 NOTE — Assessment & Plan Note (Signed)
PFT's  08/13/2018  FEV1 1.85 (63 % ) ratio 62   p symb 160/spiriva  And saba   prior to study with DLCO  75/74c % corrects to 108  % for alv volume   - 09/24/2018  After extensive coaching inhaler device,  effectiveness =    90% > try off spiriva    If this is asthma, All goals of chronic asthma control met including optimal (though not  function and elimination of symptoms with minimal need for rescue therapy.  Contingencies discussed in full including contacting this office immediately if not controlling the symptoms using the rule of two's.     Ok to try off spiriva at this point using high octane fuel analogy

## 2018-09-24 NOTE — Assessment & Plan Note (Signed)
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  Both mastoid sinuses are clear. Mild to moderate nasal septal deviation. - HRCT chest 09/19/2018  1. Mild cylindrical and varicoid bronchiectasis in the mid to lower lungs, unchanged. Associated mild bronchial wall thickening is slightly improved. 2. Stable scattered parenchymal bands in the mid to lower lungs, compatible with postinfectious/postinflammatory scarring. 3. Two month stability of 5 mm apical right upper lobe solid pulmonary nodule, more likely benign. Suggest follow-up chest CT in 6-12 months given the history of smoking and given that this nodule was previously seen to be new compared to 07/07/2017 chest CT. 4. Three-vessel coronary atherosclerosis. 5. Moderate hiatal hernia.   Reviewed above findings and approp prn use of meds for cough (mucinex/flutter) vs bronchiectasis (try doxy x 10 days)  flares and f/u here q 3 months, sooner if needed  Advised finding of 3 vessel CAD is common and risk factor for heart dz best rx is risk reduciton through wt loss and regular ex and f/u per PCP planned     I had an extended discussion with the patient reviewing all relevant studies completed to date and  lasting 15 to 20 minutes of a 25 minute visit    See device teaching which extended face to face time for this visit.  Each maintenance medication was reviewed in detail including emphasizing most importantly the difference between maintenance and prns and under what circumstances the prns are to be triggered using an action plan format that is not  reflected in the computer generated alphabetically organized AVS which I have not found useful in most complex patients, especially with respiratory illnesses  Please see AVS for specific instructions unique to this visit that I personally wrote and verbalized to the the pt in detail and then reviewed with pt  by my nurse highlighting any  changes in therapy recommended at today's visit to their plan of care.

## 2018-09-25 ENCOUNTER — Encounter: Payer: Self-pay | Admitting: Family Medicine

## 2018-09-25 DIAGNOSIS — I2510 Atherosclerotic heart disease of native coronary artery without angina pectoris: Secondary | ICD-10-CM | POA: Insufficient documentation

## 2018-10-01 ENCOUNTER — Encounter: Payer: Self-pay | Admitting: Gastroenterology

## 2018-10-05 ENCOUNTER — Ambulatory Visit (INDEPENDENT_AMBULATORY_CARE_PROVIDER_SITE_OTHER): Payer: Medicare Other | Admitting: Family Medicine

## 2018-10-05 ENCOUNTER — Encounter: Payer: Self-pay | Admitting: Family Medicine

## 2018-10-05 VITALS — BP 120/70 | HR 51 | Temp 97.7°F | Resp 16 | Wt 210.0 lb

## 2018-10-05 DIAGNOSIS — I2510 Atherosclerotic heart disease of native coronary artery without angina pectoris: Secondary | ICD-10-CM

## 2018-10-05 DIAGNOSIS — R7309 Other abnormal glucose: Secondary | ICD-10-CM | POA: Diagnosis not present

## 2018-10-05 MED ORDER — PANTOPRAZOLE SODIUM 40 MG PO TBEC: 40.0000 mg | DELAYED_RELEASE_TABLET | Freq: Two times a day (BID) | ORAL | 3 refills | Status: DC

## 2018-10-05 NOTE — Progress Notes (Signed)
Subjective:    Patient ID: Mario [name], male    DOB: August 24, 1946, 72 y.o.   MRN: [id number]  Patient was referred to pulmonology due to worsening asthma/COPD.  During the work-up of his underlying dyspnea, high resolution CT scan was performed of the chest.  The results are dictated below: IMPRESSION: 1. Mild cylindrical and varicoid bronchiectasis in the mid to lower lungs, unchanged. Associated mild bronchial wall thickening is slightly improved. 2. Stable scattered parenchymal bands in the mid to lower lungs, compatible with postinfectious/postinflammatory scarring. 3. Two month stability of 5 mm apical right upper lobe solid pulmonary nodule, more likely benign. Suggest follow-up chest CT in 6-12 months given the history of smoking and given that this nodule was previously seen to be new compared to 07/07/2017 chest CT. 4. Three-vessel coronary atherosclerosis. 5. Moderate hiatal hernia. 6. Cholelithiasis.  Aortic Atherosclerosis (ICD10-I70.0).   Patient states that his breathing is doing very well.  He is working with [employer] and has lost almost 40 pounds.  After working with Dr. Melvyn Novas and maximizing his therapy for his allergies and elevating the head of his bed, he states that his breathing is doing very well.  He denies any recent exacerbations.  He denies any chest pain.  He denies any shortness of breath.  He denies any orthopnea.  He denies any paroxysmal nocturnal dyspnea. Past Medical History:  Diagnosis Date  . Asthma   . Biliary colic 3094  . COPD (chronic obstructive pulmonary disease) with chronic bronchitis ([place])   . Coronary artery calcification seen on CAT scan   . Cyst of pancreas    1 cm (7/18), repeat ct in 12 months.    . Hyperlipidemia   . Hypertension   . Obstructive sleep apnea   . Rhinitis   . Sleep apnea    cpap   Past Surgical History:  Procedure Laterality Date  . LITHOTRIPSY  2000   for kidney stone   Current Outpatient  Medications on File Prior to Visit  Medication Sig Dispense Refill  . albuterol (PROVENTIL HFA;VENTOLIN HFA) 108 (90 Base) MCG/ACT inhaler Inhale 2 puffs into the lungs every 6 (six) hours as needed for wheezing or shortness of breath. 1 Inhaler 2  . albuterol (PROVENTIL) (2.5 MG/3ML) 0.083% nebulizer solution Take 3 mLs (2.5 mg total) by nebulization every 6 (six) hours as needed for wheezing or shortness of breath. 75 mL 12  . aspirin EC 81 MG tablet Take 81 mg by mouth daily.    Marland Kitchen atorvastatin (LIPITOR) 40 MG tablet TAKE 1 TABLET BY MOUTH EVERY DAY 90 tablet 1  . Coenzyme Q10 (CO Q-10) 100 MG CAPS Take 1 capsule by mouth daily.    Marland Kitchen doxycycline (VIBRA-TABS) 100 MG tablet One twice daily before meals with large glass of water 20 tablet 11  . fluticasone (FLONASE) 50 MCG/ACT nasal spray Place into the nose.    Marland Kitchen glucosamine-chondroitin 500-400 MG tablet Take 1 tablet by mouth 3 (three) times daily.    Marland Kitchen guaiFENesin (MUCINEX) 600 MG 12 hr tablet Take 1,200 mg by mouth 2 (two) times daily as needed.    . Hypertonic Nasal Wash (SINUS RINSE BOTTLE KIT NA) Place into the nose 2 (two) times daily.    Marland Kitchen losartan-hydrochlorothiazide (HYZAAR) 100-25 MG tablet Take 1 tablet by mouth daily. 90 tablet 3  . montelukast (SINGULAIR) 10 MG tablet Take 1 tablet (10 mg total) by mouth daily. 90 tablet 1  . pantoprazole (PROTONIX) 40 MG tablet TAKE  1 TABLET BY MOUTH TWICE A DAY 90 tablet 3  . Psyllium 0.36 g CAPS Take 1 capsule by mouth daily.    Marland Kitchen Respiratory Therapy Supplies (FLUTTER) DEVI Use as directed 1 each 0  . SYMBICORT 160-4.5 MCG/ACT inhaler INHALE 2 PUFFS INTO THE LUNGS 2 TIMES DAILY. 10.2 Inhaler 12  . therapeutic multivitamin-minerals (THERAGRAN-M) tablet Take 1 tablet by mouth daily.    Marland Kitchen UNABLE TO FIND Med Name: clear lungs otc 4 x daily     No current facility-administered medications on file prior to visit.    No Known Allergies Social History   Socioeconomic History  . Marital status:  Married    Spouse name: Not on file  . Number of children: Not on file  . Years of education: Not on file  . Highest education level: Not on file  Occupational History  . Not on file  Social Needs  . Financial resource strain: Not on file  . Food insecurity:    Worry: Not on file    Inability: Not on file  . Transportation needs:    Medical: Not on file    Non-medical: Not on file  Tobacco Use  . Smoking status: Former Smoker    Packs/day: 2.00    Years: 11.00    Pack years: 22.00    Types: Cigarettes    Last attempt to quit: 12/19/1974    Years since quitting: 43.8  . Smokeless tobacco: Never Used  Substance and Sexual Activity  . Alcohol use: Yes    Comment: 1 glass wine a month  . Drug use: No  . Sexual activity: Not on file  Lifestyle  . Physical activity:    Days per week: Not on file    Minutes per session: Not on file  . Stress: Not on file  Relationships  . Social connections:    Talks on phone: Not on file    Gets together: Not on file    Attends religious service: Not on file    Active member of club or organization: Not on file    Attends meetings of clubs or organizations: Not on file    Relationship status: Not on file  . Intimate partner violence:    Fear of current or ex partner: Not on file    Emotionally abused: Not on file    Physically abused: Not on file    Forced sexual activity: Not on file  Other Topics Concern  . Not on file  Social History Narrative  . Not on file      Review of Systems  Respiratory: Positive for cough.   All other systems reviewed and are negative.      Objective:   Physical Exam  Constitutional: He appears well-developed and well-nourished.  HENT:  Head: Normocephalic and atraumatic.  Nose: Nose normal.  Mouth/Throat: Oropharynx is clear and moist. No oropharyngeal exudate.  Cardiovascular: Normal rate, regular rhythm and normal heart sounds.  Pulmonary/Chest: Effort normal. No stridor. No respiratory  distress. He has no wheezes. He has no rhonchi. He has no rales.  Vitals reviewed.         Assessment & Plan:  Coronary artery calcification seen on CAT scan - Plan: CBC with Differential/Platelet, COMPLETE METABOLIC PANEL WITH GFR, Lipid panel, Hemoglobin A1c  Spent the majority of her visit today discussing the findings on the CT scan.  At the present time he denies any angina or unstable angina or shortness of breath.  Therefore I do not  believe that the patient requires a stress test.  We will focus on medical management.  He is taking aspirin 81 mg a day.  I want to work on keeping his blood pressure less than 140/90.  Today is excellent at 120/70.  I will check a fasting lipid panel.  I would like to keep his LDL cholesterol below 70.  I will also check a hemoglobin A1c although I doubt diabetes given his recent weight loss.

## 2018-10-06 LAB — CBC WITH DIFFERENTIAL/PLATELET
BASOS ABS: 43 {cells}/uL (ref 0–200)
BASOS PCT: 0.5 %
Eosinophils Absolute: 77 cells/uL (ref 15–500)
Eosinophils Relative: 0.9 %
HCT: 44.2 % (ref 38.5–50.0)
HEMOGLOBIN: 14.9 g/dL (ref 13.2–17.1)
Lymphs Abs: 2406 cells/uL (ref 850–3900)
MCH: 29.6 pg (ref 27.0–33.0)
MCHC: 33.7 g/dL (ref 32.0–36.0)
MCV: 87.7 fL (ref 80.0–100.0)
MONOS PCT: 9.8 %
MPV: 10.6 fL (ref 7.5–12.5)
Neutro Abs: 5143 cells/uL (ref 1500–7800)
Neutrophils Relative %: 60.5 %
PLATELETS: 284 10*3/uL (ref 140–400)
RBC: 5.04 10*6/uL (ref 4.20–5.80)
RDW: 13 % (ref 11.0–15.0)
TOTAL LYMPHOCYTE: 28.3 %
WBC: 8.5 10*3/uL (ref 3.8–10.8)
WBCMIX: 833 {cells}/uL (ref 200–950)

## 2018-10-06 LAB — COMPLETE METABOLIC PANEL WITH GFR
AG RATIO: 1.7 (calc) (ref 1.0–2.5)
ALBUMIN MSPROF: 3.8 g/dL (ref 3.6–5.1)
ALKALINE PHOSPHATASE (APISO): 43 U/L (ref 40–115)
ALT: 24 U/L (ref 9–46)
AST: 23 U/L (ref 10–35)
BUN / CREAT RATIO: 27 (calc) — AB (ref 6–22)
BUN: 26 mg/dL — AB (ref 7–25)
CHLORIDE: 103 mmol/L (ref 98–110)
CO2: 30 mmol/L (ref 20–32)
Calcium: 9.3 mg/dL (ref 8.6–10.3)
Creat: 0.96 mg/dL (ref 0.70–1.18)
GFR, EST AFRICAN AMERICAN: 91 mL/min/{1.73_m2} (ref >=60)
GFR, Est Non African American: 78 mL/min/{1.73_m2} (ref >=60)
GLUCOSE: 76 mg/dL (ref 65–99)
Globulin: 2.3 g/dL (calc) (ref 1.9–3.7)
POTASSIUM: 3.8 mmol/L (ref 3.5–5.3)
Sodium: 141 mmol/L (ref 135–146)
TOTAL PROTEIN: 6.1 g/dL (ref 6.1–8.1)
Total Bilirubin: 0.9 mg/dL (ref 0.2–1.2)

## 2018-10-06 LAB — HEMOGLOBIN A1C
EAG (MMOL/L): 5.8 (calc)
Hgb A1c MFr Bld: 5.3 % of total Hgb (ref <5.7)
MEAN PLASMA GLUCOSE: 105 (calc)

## 2018-10-06 LAB — LIPID PANEL
CHOLESTEROL: 151 mg/dL (ref <200)
HDL: 46 mg/dL (ref >40)
LDL Cholesterol (Calc): 88 mg/dL (calc)
Non-HDL Cholesterol (Calc): 105 mg/dL (calc) (ref <130)
Total CHOL/HDL Ratio: 3.3 (calc) (ref <5.0)
Triglycerides: 82 mg/dL (ref <150)

## 2018-10-07 ENCOUNTER — Other Ambulatory Visit: Payer: Self-pay | Admitting: Family Medicine

## 2018-10-11 ENCOUNTER — Other Ambulatory Visit: Payer: Self-pay | Admitting: Family Medicine

## 2018-10-11 MED ORDER — ROSUVASTATIN CALCIUM 20 MG PO TABS: 20.0000 mg | ORAL_TABLET | Freq: Every day | ORAL | 3 refills | Status: DC

## 2018-10-30 ENCOUNTER — Encounter: Payer: Self-pay | Admitting: Gastroenterology

## 2018-10-30 ENCOUNTER — Ambulatory Visit (AMBULATORY_SURGERY_CENTER): Payer: Self-pay | Admitting: *Deleted

## 2018-10-30 VITALS — Ht 69.0 in | Wt 212.0 lb

## 2018-10-30 DIAGNOSIS — Z86010 Personal history of colonic polyps: Secondary | ICD-10-CM

## 2018-10-30 MED ORDER — PEG 3350-KCL-NA BICARB-NACL 420 G PO SOLR: 4000.0000 mL | Freq: Once | ORAL | 0 refills | Status: AC

## 2018-10-30 NOTE — Progress Notes (Signed)
Patient denies any allergies to eggs or soy. Patient denies any problems with anesthesia/sedation. Patient denies any oxygen use at home. Patient denies taking any diet/weight loss medications or blood thinners. EMMI education offered, pt declined.  

## 2018-10-31 ENCOUNTER — Telehealth: Payer: Self-pay | Admitting: Family Medicine

## 2018-10-31 NOTE — Telephone Encounter (Signed)
Patient says he needs new prescription for bp medication and water pill, would like a call back befor called in  Please call [phone]

## 2018-11-01 NOTE — Telephone Encounter (Signed)
Left message return call

## 2018-11-02 NOTE — Telephone Encounter (Signed)
Patient left vm returning your call.

## 2018-11-05 MED ORDER — LOSARTAN POTASSIUM 100 MG PO TABS: 100.0000 mg | ORAL_TABLET | Freq: Every day | ORAL | 3 refills | Status: DC

## 2018-11-05 MED ORDER — HYDROCHLOROTHIAZIDE 25 MG PO TABS: 25.0000 mg | ORAL_TABLET | Freq: Every day | ORAL | 3 refills | Status: DC

## 2018-11-05 NOTE — Telephone Encounter (Signed)
Please split the med into the two individual components.

## 2018-11-05 NOTE — Telephone Encounter (Signed)
Spoke with patient and patient informed me that his Losartan-HCTZ 100-25 is on back order with his pharmacy. He would like to know if we can call something else in. Please advise?

## 2018-11-05 NOTE — Telephone Encounter (Signed)
Spoke with patient and informed him that we sent in the same medication but split it up into two different combinations. Patient verbalized understanding.

## 2018-11-06 ENCOUNTER — Ambulatory Visit (AMBULATORY_SURGERY_CENTER): Payer: Medicare Other | Admitting: Gastroenterology

## 2018-11-06 ENCOUNTER — Encounter: Payer: Self-pay | Admitting: Gastroenterology

## 2018-11-06 VITALS — BP 118/54 | HR 92 | Temp 97.8°F | Resp 18 | Ht 69.0 in | Wt 212.0 lb

## 2018-11-06 DIAGNOSIS — D122 Benign neoplasm of ascending colon: Secondary | ICD-10-CM | POA: Diagnosis not present

## 2018-11-06 DIAGNOSIS — K5730 Diverticulosis of large intestine without perforation or abscess without bleeding: Secondary | ICD-10-CM

## 2018-11-06 DIAGNOSIS — D120 Benign neoplasm of cecum: Secondary | ICD-10-CM

## 2018-11-06 DIAGNOSIS — D125 Benign neoplasm of sigmoid colon: Secondary | ICD-10-CM

## 2018-11-06 DIAGNOSIS — Z86010 Personal history of colonic polyps: Secondary | ICD-10-CM

## 2018-11-06 DIAGNOSIS — Z1211 Encounter for screening for malignant neoplasm of colon: Secondary | ICD-10-CM | POA: Diagnosis not present

## 2018-11-06 DIAGNOSIS — K635 Polyp of colon: Secondary | ICD-10-CM

## 2018-11-06 MED ORDER — SODIUM CHLORIDE 0.9 % IV SOLN: 500.0000 mL | INTRAVENOUS | Status: DC

## 2018-11-06 NOTE — Patient Instructions (Signed)
Thank you for allowing [place] to care for you today!  Await pathology results by mail, approximately 2 weeks.  Recommendation for next colonoscopy will be included.  Resume previous diet and medications today.  Return to normal activities tomorrow.      YOU HAD AN ENDOSCOPIC PROCEDURE TODAY AT [facility]:   Refer to the procedure report that was given to you for any specific questions about what was found during the examination.  If the procedure report does not answer your questions, please call your gastroenterologist to clarify.  If you requested that your care partner not be given the details of your procedure findings, then the procedure report has been included in a sealed envelope for you to review at your convenience later.  YOU SHOULD EXPECT: Some feelings of bloating in the abdomen. Passage of more gas than usual.  Walking can help get rid of the air that was put into your GI tract during the procedure and reduce the bloating. If you had a lower endoscopy (such as a colonoscopy or flexible sigmoidoscopy) you may notice spotting of blood in your stool or on the toilet paper. If you underwent a bowel prep for your procedure, you may not have a normal bowel movement for a few days.  Please Note:  You might notice some irritation and congestion in your nose or some drainage.  This is from the oxygen used during your procedure.  There is no need for concern and it should clear up in a day or so.  SYMPTOMS TO REPORT IMMEDIATELY:   Following lower endoscopy (colonoscopy or flexible sigmoidoscopy):  Excessive amounts of blood in the stool  Significant tenderness or worsening of abdominal pains  Swelling of the abdomen that is new, acute  Fever of 100F or higher    For urgent or emergent issues, a gastroenterologist can be reached at any hour by calling [phone].   DIET:  We do recommend a small meal at first, but then you may proceed to your regular diet.  Drink  plenty of fluids but you should avoid alcoholic beverages for 24 hours.  ACTIVITY:  You should plan to take it easy for the rest of today and you should NOT DRIVE or use heavy machinery until tomorrow (because of the sedation medicines used during the test).    FOLLOW UP: Our staff will call the number listed on your records the next business day following your procedure to check on you and address any questions or concerns that you may have regarding the information given to you following your procedure. If we do not reach you, we will leave a message.  However, if you are feeling well and you are not experiencing any problems, there is no need to return our call.  We will assume that you have returned to your regular daily activities without incident.  If any biopsies were taken you will be contacted by phone or by letter within the next 1-3 weeks.  Please call us at [phone] if you have not heard about the biopsies in 3 weeks.    SIGNATURES/CONFIDENTIALITY: You and/or your care partner have signed paperwork which will be entered into your electronic medical record.  These signatures attest to the fact that that the information above on your After Visit Summary has been reviewed and is understood.  Full responsibility of the confidentiality of this discharge information lies with you and/or your care-partner.

## 2018-11-06 NOTE — Progress Notes (Signed)
Pt's states no medical or surgical changes since previsit or office visit. 

## 2018-11-06 NOTE — Progress Notes (Signed)
Called to room to assist during endoscopic procedure.  Patient ID and intended procedure confirmed with present staff. Received instructions for my participation in the procedure from the performing physician.  

## 2018-11-06 NOTE — Op Note (Addendum)
[place] Patient Name: Mario [name] Procedure Date: 11/06/2018 2:50 PM MRN: [id number] Endoscopist: Milus Banister , MD Age: 72 Referring MD:  Date of Birth: [birth year] Gender: Male Account #: [id number] Procedure:                Colonoscopy Indications:              High risk colon cancer surveillance: Personal                            history of colonic polyps; Colonoscopy 2005 Dr.                            Sharlett Iles "multiple polyps 1-76mm" some of these                            were adenomatous on pathology. Colonsocopy                            Dr.Patterson 2008 found no polyps. Colonoscopy 2015                            Dr. Ardis Hughs found 5 subCM polyps, three of them were                            adenomatous. Medicines:                Monitored Anesthesia Care Procedure:                Pre-Anesthesia Assessment:                           - Prior to the procedure, a History and Physical                            was performed, and patient medications and                            allergies were reviewed. The patient's tolerance of                            previous anesthesia was also reviewed. The risks                            and benefits of the procedure and the sedation                            options and risks were discussed with the patient.                            All questions were answered, and informed consent                            was obtained. Prior Anticoagulants: The patient has  taken no previous anticoagulant or antiplatelet                            agents. ASA Grade Assessment: II - A patient with                            mild systemic disease. After reviewing the risks                            and benefits, the patient was deemed in                            satisfactory condition to undergo the procedure.                           After obtaining informed consent, the colonoscope                      was passed under direct vision. Throughout the                            procedure, the patient's blood pressure, pulse, and                            oxygen saturations were monitored continuously. The                            Model PCF-[id number]) scope was introduced                            through the anus and advanced to the the cecum,                            identified by appendiceal orifice and ileocecal                            valve. The colonoscopy was performed without                            difficulty. The patient tolerated the procedure                            well. The quality of the bowel preparation was                            good. The ileocecal valve, appendiceal orifice, and                            rectum were photographed. Scope In: 3:04:15 PM Scope Out: 3:23:55 PM Scope Withdrawal Time: 0 hours 14 minutes 7 seconds  Total Procedure Duration: 0 hours 19 minutes 40 seconds  Findings:                 Four sessile polyps were found in the sigmoid  colon, ascending colon and cecum. The polyps were 2                            to 5 mm in size. These polyps were removed with a                            cold snare. Resection and retrieval were complete.                           Multiple small and large-mouthed diverticula were                            found in the left colon.                           Internal hemorrhoids were found. The hemorrhoids                            were small.                           The exam was otherwise without abnormality on                            direct and retroflexion views. Complications:            No immediate complications. Estimated blood loss:                            None. Estimated Blood Loss:     Estimated blood loss: none. Impression:               - Four 2 to 5 mm polyps in the sigmoid colon, in                            the ascending colon  and in the cecum, removed with                            a cold snare. Resected and retrieved.                           - Diverticulosis in the left colon.                           - Internal hemorrhoids.                           - The examination was otherwise normal on direct                            and retroflexion views. Recommendation:           - Patient has a contact number available for                            emergencies. The signs and symptoms  of potential                            delayed complications were discussed with the                            patient. Return to normal activities tomorrow.                            Written discharge instructions were provided to the                            patient.                           - Resume previous diet.                           - Continue present medications.                           You will receive a letter within 2-3 weeks with the                            pathology results and my final recommendations.                           If the polyp(s) is proven to be 'pre-cancerous' on                            pathology, you will need repeat colonoscopy in 3-5                            years. Milus Banister, MD 11/06/2018 3:27:15 PM This report has been signed electronically.

## 2018-11-06 NOTE — Progress Notes (Signed)
To PACU, VSS. Report to [email or web address] 

## 2018-11-07 ENCOUNTER — Telehealth: Payer: Self-pay

## 2018-11-07 NOTE — Telephone Encounter (Signed)
  Follow up Call-  Call back number 11/06/2018  Post procedure Call Back phone  # [phone]  Permission to leave phone message Yes  Some recent data might be hidden     Patient questions:  Do you have a fever, pain , or abdominal swelling? No. Pain Score  0 *  Have you tolerated food without any problems? Yes.    Have you been able to return to your normal activities? Yes.    Do you have any questions about your discharge instructions: Diet   No. Medications  No. Follow up visit  No.  Do you have questions or concerns about your Care? No.  Actions: * If pain score is 4 or above: No action needed, pain <4.

## 2018-11-13 ENCOUNTER — Encounter: Payer: Self-pay | Admitting: Gastroenterology

## 2018-11-22 DIAGNOSIS — M170 Bilateral primary osteoarthritis of knee: Secondary | ICD-10-CM | POA: Diagnosis not present

## 2018-12-25 ENCOUNTER — Ambulatory Visit: Payer: Medicare Other | Admitting: Internal Medicine

## 2018-12-25 ENCOUNTER — Encounter: Payer: Self-pay | Admitting: Internal Medicine

## 2018-12-25 VITALS — BP 124/70 | HR 50 | Ht 69.0 in | Wt 205.0 lb

## 2018-12-25 DIAGNOSIS — J479 Bronchiectasis, uncomplicated: Secondary | ICD-10-CM | POA: Diagnosis not present

## 2018-12-25 DIAGNOSIS — J4540 Moderate persistent asthma, uncomplicated: Secondary | ICD-10-CM | POA: Diagnosis not present

## 2018-12-25 NOTE — Patient Instructions (Signed)
No change in recommendations   Please schedule a follow up visit in 6  months but call sooner if needed    

## 2018-12-25 NOTE — Progress Notes (Signed)
Mario [name], male    DOB: September 10, 1946, 73 y.o.   MRN: [id number]   Brief patient profile:  72 yowm quit smoking in 1976 reports during  teens in [place] cough/ wheeze and several admits [facility] dx allergy to grass/ dust with def correlation with his symptoms  but resolved by age 27 then recurred while smoking in 1976 and seemed to get worse while in [place]> pulmonary eval rx and happy with results but on daily meds ever since moved to [place] in 1991 and saw [place] [facility] in 2000 for placed on shots x maybe a year but no change in maint therapy rx which seemed to help but freq flares changed from symbicort/spiriva  to trelegy then much worse while on trelegy which was around 03/2018 with cough/ wheeze>  On his own switched back to symbicort/spiriva around 06/25/18 and referred to pulmonary clinic 07/05/2018 by Dr   Dennard Schaumann.    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 Pulmonary Consult    Referred by [name]. Pt c/o cough and SOB x 3 months. He is coughing up clear sputum, esp worse in the am. He uses proair 1-2 x daily on average and has neb with albuterol that he rarely uses.   has sensation "lungs full of congestion" but doesn't cough much up and what he does is clear  cpap helps at hs / sleeps  on R side and bed is flat/ on left side side down wheezing keeps him up even on cpap Last pred 10 days prior to OV  And not better whereas previously worked great. Finished the abx about the time of the last ct 06/26/18  rec Bronchiectasis =   you have scarring of your bronchial tubes which means that they don't function perfectly normally and mucus tends to pool in certain areas of your lung which can cause pneumonia and further scarring of your lung and bronchial tubes Whenever you develop cough congestion take mucinex or mucinex dm > these will help keep the mucus loose and flowing but  if your condition worsens you need to seek help immediately preferably here or somewhere inside the [facility] system to compare xrays ( worse = darker or bloody mucus or pain on breathing in)   Use the flutter valve as much as possible Protonix 40 mg Take 30- 60 min before your first and last meals of the day  GERD (REFLUX)    schedule sinus CT > pos > ENT eval req Please schedule a follow up office visit in 4 weeks, sooner if needed with PFTs on return    08/13/2018  f/u ov/Mario [name] re: chronic asthma vs ACOS vs obst bronchiectasis  Chief Complaint  Patient presents with  . Follow-up    Pt states he feels like he is only some better since last visit. Pt has c/o SOB with exertion, cough with clear mucus, occ. chest tightness.  Dyspnea:  MMRC1 = can walk nl pace, flat grade, can't hurry or go uphills or steps s sob   Cough: since 08/10/18 was better p ent rx, some clear mucus now   Sleeping: ok on 3 inch blocks SABA use: albuterol hs only  rec Plan A = Automatic = symbiocort 160 x 2 and spiriva x 2 pffs  first thing in am  then 12 hours later Symbiort x 160 mg Work on inhaler technique:  Plan B = Backup Only use your albuterol as a rescue medication Plan C = Crisis - only use your albuterol nebulizer if you first try Plan B and it fails to help > ok to use the nebulizer up to every 4 hours but if start needing it regularly call for immediate appointment     09/24/2018  f/u ov/Mario [name] re: chronic asthma vs acos vs obst bronchiectasis assoc with chronic rhinitis/sinusitis under ent care Chief Complaint  Patient presents with  . Follow-up    Cough has improved some. He states his breathing is doing well. He has used his albuterol inhaler x 1 since the last visit. He rarely uses neb.   Dyspnea:  Walks 30 min on a track  Better and better ex tol  Cough: better then worse x 2 weeks esp p stirs  But not using symb first thing  Sleeping: fine on 3 in blocks  SABA use: only used once as hfa/ not neb    02: no 02 rec Be sure to take the am dose dose of symbicort 160 x 2 first thing in am and leave off spiriva (think of it like high octane fuel)  For thick mucus/ cough / congestion > mucinex dm up to 1200 mg every 12 hours with glass of water and the flutter valve as much as possible  For nasty mucus >  Doxycyline 100 mg twice daily x 10 day course (refillable so keep on file)      12/25/2018  f/u ov/Mario [name] re:  Chronic asthma vs acos vs obst bronchiectasis, maint symb Chief Complaint  Patient presents with  . Follow-up    He has had some cough for the past 10 days- prod with clear sputum. He rarely uses his albuterol inhaler or neb.  Dyspnea:  More limited by knees than sob/ plans statinary bike Cough: mostly mucoid, better on mucinex not using flutter  Sleeping: 3 inch blocks fine  SABA use:  No saba  02: none  Nasal symtoms better with rinses / flonase per ENT    No obvious day to day or daytime variability or assoc   purulent sputum or mucus plugs or hemoptysis or cp or chest tightness, subjective wheeze or overt sinus or hb symptoms.   Sleeping as above  without nocturnal  or early am exacerbation  of respiratory  c/o's or need for noct saba. Also denies any obvious fluctuation of symptoms with weather or environmental changes or other aggravating or alleviating factors except as outlined above   No unusual exposure hx or h/o childhood pna/ asthma or knowledge of premature birth.  Current Allergies, Complete Past Medical History, Past Surgical History, Family History, and Social History were reviewed in [employer] record.  ROS  The following are not active complaints unless bolded Hoarseness, sore throat, dysphagia, dental problems, itching, sneezing,  nasal congestion or discharge of excess mucus or purulent secretions, ear ache,   fever, chills, sweats, unintended wt loss or wt gain, classically pleuritic or exertional cp,  orthopnea pnd or arm/hand  swelling  or leg swelling, presyncope, palpitations, abdominal pain, anorexia, nausea, vomiting, diarrhea  or change in bowel habits or change in bladder habits, change in stools or change in urine, dysuria, hematuria,  rash, arthralgias, visual complaints, headache, numbness, weakness or ataxia or problems with walking or coordination,  change in mood or  memory.        Current Meds  Medication Sig  . albuterol (PROVENTIL HFA;VENTOLIN HFA) 108 (90  Base) MCG/ACT inhaler Inhale 2 puffs into the lungs every 6 (six) hours as needed for wheezing or shortness of breath.  Marland Kitchen albuterol (PROVENTIL) (2.5 MG/3ML) 0.083% nebulizer solution Take 3 mLs (2.5 mg total) by nebulization every 6 (six) hours as needed for wheezing or shortness of breath.  Marland Kitchen apixaban (ELIQUIS) 5 MG TABS tablet Take 5 mg by mouth 2 (two) times daily.  Marland Kitchen aspirin EC 81 MG tablet Take 81 mg by mouth daily.  Marland Kitchen atorvastatin (LIPITOR) 40 MG tablet Take 40 mg by mouth daily.  . Cholecalciferol (VITAMIN D3) 125 MCG (5000 UT) TABS Take by mouth.  . Coenzyme Q10 (CO Q-10) 100 MG CAPS Take 1 capsule by mouth daily.  . fluticasone (FLONASE) 50 MCG/ACT nasal spray Place into the nose.  Marland Kitchen glucosamine-chondroitin 500-400 MG tablet Take 1 tablet by mouth 3 (three) times daily.  Marland Kitchen guaiFENesin (MUCINEX) 600 MG 12 hr tablet Take 1,200 mg by mouth 2 (two) times daily as needed.  . hydrochlorothiazide (HYDRODIURIL) 25 MG tablet Take 1 tablet (25 mg total) by mouth daily.  . Hypertonic Nasal Wash (SINUS RINSE BOTTLE KIT NA) Place into the nose 2 (two) times daily.  Marland Kitchen losartan (COZAAR) 100 MG tablet Take 1 tablet (100 mg total) by mouth daily.  Marland Kitchen losartan-hydrochlorothiazide (HYZAAR) 100-25 MG tablet TAKE 1 TABLET BY MOUTH EVERY DAY  . metoprolol succinate (TOPROL-XL) 25 MG 24 hr tablet Take 25 mg by mouth daily.  . montelukast (SINGULAIR) 10 MG tablet Take 1 tablet (10 mg total) by mouth daily.  . pantoprazole (PROTONIX) 40 MG tablet Take 1 tablet (40 mg  total) by mouth 2 (two) times daily.  . Psyllium 0.36 g CAPS Take 1 capsule by mouth daily.  Marland Kitchen Respiratory Therapy Supplies (FLUTTER) DEVI Use as directed  . SYMBICORT 160-4.5 MCG/ACT inhaler INHALE 2 PUFFS INTO THE LUNGS 2 TIMES DAILY.  Marland Kitchen therapeutic multivitamin-minerals (THERAGRAN-M) tablet Take 1 tablet by mouth daily.  Marland Kitchen UNABLE TO FIND Med Name: clear lungs otc 4 x daily                   Objective:       amb wm nad  Vital signs reviewed - Note on arrival 02 sats  95% on RA    12/25/2018         205  09/24/2018       212  08/13/2018       218   07/05/18 224 lb (101.6 kg)  06/12/18 230 lb (104.3 kg)  05/25/18 233 lb (105.7 kg)        HEENT: nl dentition / oropharynx. Nl external ear canals without cough reflex -  Mild bilateral non-specific turbinate edema     NECK :  without JVD/Nodes/TM/ nl carotid upstrokes bilaterally   LUNGS: no acc muscle use,  Mild barrel  contour chest wall with bilateral  Distant bs s audible wheeze and  without cough on insp or exp maneuver and mild  Hyperresonant  to  percussion bilaterally     CV:  RRR  no s3 or murmur or increase in P2, and no edema   ABD:  soft and nontender with pos late  insp Hoover's  in the supine position. No bruits or organomegaly appreciated, bowel sounds nl  MS:   Nl gait/  ext warm without deformities, calf tenderness, cyanosis or clubbing No obvious joint restrictions   SKIN: warm and dry without lesions    NEURO:  alert, approp, nl sensorium with  no motor or cerebellar deficits apparent.                 Assessment

## 2018-12-26 ENCOUNTER — Encounter: Payer: Self-pay | Admitting: Internal Medicine

## 2018-12-26 NOTE — Assessment & Plan Note (Signed)
PFT's  08/13/2018  FEV1 1.85 (63 % ) ratio 62   p symb 160/spiriva  And saba   prior to study with DLCO  75/74c % corrects to 108  % for alv volume   - 09/24/2018  After extensive coaching inhaler device,  effectiveness =    90% > try off spiriva > no worse as of 12/25/2018    All goals of chronic asthma control met including optimal function and elimination of symptoms with minimal need for rescue therapy.  Contingencies discussed in full including contacting this office immediately if not controlling the symptoms using the rule of two's.     Each maintenance medication was reviewed in detail including most importantly the difference between maintenance and as needed and under what circumstances the prns are to be used.  Please see AVS for specific  Instructions which are unique to this visit and I personally typed out  which were reviewed in detail in writing with the patient and a copy provided.    F/u q 6 m, sooner prn

## 2018-12-26 NOTE — Assessment & Plan Note (Signed)
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  Both mastoid sinuses are clear. Mild to moderate nasal septal deviation. - HRCT chest 09/19/2018  1. Mild cylindrical and varicoid bronchiectasis in the mid to lower lungs, unchanged. Associated mild bronchial wall thickening is slightly improved. 2. Stable scattered parenchymal bands in the mid to lower lungs, compatible with postinfectious/postinflammatory scarring. 3. Two month stability of 5 mm apical right upper lobe solid pulmonary nodule, more likely benign. Suggest follow-up chest CT in 6-12 months given the history of smoking and given that this nodule was previously seen to be new compared to 07/07/2017 chest CT. 4. Three-vessel coronary atherosclerosis. 5. Moderate hiatal hernia.   Results reviewed, better control since sinusitis/ rhinitis addressed per ENT

## 2019-01-16 ENCOUNTER — Other Ambulatory Visit: Payer: Self-pay | Admitting: Cardiovascular Disease

## 2019-01-16 NOTE — Telephone Encounter (Signed)
Please review for refill, Thanks !  

## 2019-01-22 ENCOUNTER — Other Ambulatory Visit: Payer: Self-pay

## 2019-01-22 ENCOUNTER — Emergency Department (HOSPITAL_COMMUNITY)
Admission: EM | Admit: 2019-01-22 | Discharge: 2019-01-22 | Disposition: A | Discharge status: Home / Self Care | Payer: Medicare Other | Attending: Emergency Medicine | Admitting: Emergency Medicine

## 2019-01-22 ENCOUNTER — Encounter (HOSPITAL_COMMUNITY): Payer: Self-pay

## 2019-01-22 DIAGNOSIS — Z5321 Procedure and treatment not carried out due to patient leaving prior to being seen by health care provider: Secondary | ICD-10-CM | POA: Insufficient documentation

## 2019-01-22 DIAGNOSIS — M7918 Myalgia, other site: Secondary | ICD-10-CM | POA: Insufficient documentation

## 2019-01-22 HISTORY — DX: Disorder of kidney and ureter, unspecified: N28.9

## 2019-01-22 NOTE — ED Notes (Signed)
Patient refused left knee x-ray.
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
## 2019-01-30 ENCOUNTER — Other Ambulatory Visit: Payer: Self-pay | Admitting: Family Medicine

## 2019-02-27 ENCOUNTER — Other Ambulatory Visit: Payer: Self-pay | Admitting: Family Medicine

## 2019-04-15 ENCOUNTER — Telehealth: Payer: Self-pay | Admitting: Cardiovascular Disease

## 2019-04-15 ENCOUNTER — Other Ambulatory Visit: Payer: Self-pay | Admitting: Cardiovascular Disease

## 2019-04-15 NOTE — Telephone Encounter (Signed)
Smartphone/ consent/ my chart via email/ pre reg completed

## 2019-04-16 ENCOUNTER — Telehealth: Payer: Self-pay

## 2019-04-16 ENCOUNTER — Other Ambulatory Visit: Payer: Self-pay | Admitting: Pharmacist Clinician (PhC)/ Clinical Pharmacy Specialist

## 2019-04-16 ENCOUNTER — Telehealth (INDEPENDENT_AMBULATORY_CARE_PROVIDER_SITE_OTHER): Payer: Medicare Other | Admitting: Cardiovascular Disease

## 2019-04-16 DIAGNOSIS — I480 Paroxysmal atrial fibrillation: Secondary | ICD-10-CM | POA: Diagnosis not present

## 2019-04-16 DIAGNOSIS — I2510 Atherosclerotic heart disease of native coronary artery without angina pectoris: Secondary | ICD-10-CM | POA: Diagnosis not present

## 2019-04-16 DIAGNOSIS — E782 Mixed hyperlipidemia: Secondary | ICD-10-CM | POA: Diagnosis not present

## 2019-04-16 DIAGNOSIS — I10 Essential (primary) hypertension: Secondary | ICD-10-CM

## 2019-04-16 MED ORDER — APIXABAN 5 MG PO TABS: 5.0000 mg | ORAL_TABLET | Freq: Two times a day (BID) | ORAL | 1 refills | Status: DC

## 2019-04-16 NOTE — Progress Notes (Signed)
Virtual Visit via Video Note   This visit type was conducted due to national recommendations for restrictions regarding the COVID-19 Pandemic (e.g. social distancing) in an effort to limit this patient's exposure and mitigate transmission in our community.  Due to his co-morbid illnesses, this patient is at least at moderate risk for complications without adequate follow up.  This format is felt to be most appropriate for this patient at this time.  All issues noted in this document were discussed and addressed.  A limited physical exam was performed with this format.  Please refer to the patient's chart for his consent to telehealth for [facility].   Evaluation Performed:  Follow-up visit  Date:  04/16/2019   ID:  Mario [name], DOB 1946-10-10, MRN [id number]  Patient Location: Home Provider Location: Home  PCP:  [name], MD  Cardiologist: Dr. Quay Burow Electrophysiologist:  None   Chief Complaint: 1 year follow-up  History of Present Illness:    Mario [name] is a 73 y.o. 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  I last saw him in the office 12/29/2017.  He was referred by [name], his PCP, for evaluation and treatment of PAF. He has a history of hypertension, and hyperlipidemia. He does have obstructive sleep apnea on CPAP. He's never had a heart attack or stroke and denies chest pain or shortness of breath. 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 He drinks 2 cups of caffeine a day. He's had palpitations for the last 2 years. Mostly basis lasting up to one hour at a time.  Because of these palpitations, he had an event monitor performed 01/08/2018 revealing principally sinus rhythm with episodes of PAF.  Based on this he has continued his  Eliquis oral anticoagulation.  Otherwise, he is asymptomatic specifically denying chest pain or shortness of breath.  He did lose over 50 pounds in the weight watchers program beginning initially 245 and currently weighing 190.  He did have sleep apnea but since his weight loss he no longer wears CPAP.   The patient does not have symptoms concerning for COVID-19 infection (fever, chills, cough, or new shortness of breath).    Past Medical History:  Diagnosis Date  . Asthma   . Biliary colic 0932  . COPD (chronic obstructive pulmonary disease) with chronic bronchitis ([place])   . Coronary artery calcification seen on CAT scan   . Cyst of pancreas    1 cm (7/18), repeat ct in 12 months.    . Hyperlipidemia   . Hypertension   . Obstructive sleep apnea   . Renal disorder   . Rhinitis   . Sleep apnea    pt denies sleep apnea-lost weight   Past Surgical History:  Procedure Laterality Date  . COLONOSCOPY  09/29/2014  . LITHOTRIPSY  2000   for kidney stone     Current Meds  Medication Sig  . albuterol (PROVENTIL HFA;VENTOLIN HFA) 108 (90 Base) MCG/ACT inhaler Inhale 2 puffs into the lungs every 6 (six) hours as needed for wheezing or shortness of breath.  Marland Kitchen albuterol (PROVENTIL) (2.5 MG/3ML) 0.083% nebulizer solution Take 3 mLs (2.5 mg total) by nebulization every 6 (six) hours as needed for wheezing or shortness of breath.  Marland Kitchen atorvastatin (LIPITOR) 40 MG tablet Take 40 mg by  mouth daily.  . Cholecalciferol (VITAMIN D3) 125 MCG (5000 UT) TABS Take by mouth.  . Coenzyme Q10 (CO Q-10) 100 MG CAPS Take 1 capsule by mouth daily.  Marland Kitchen ELIQUIS 5 MG TABS tablet TAKE 1 TABLET BY MOUTH TWICE A DAY  . fluticasone (FLONASE) 50 MCG/ACT nasal spray Place into the nose.  Marland Kitchen glucosamine-chondroitin 500-400 MG tablet Take 1 tablet by mouth 3 (three) times daily.  Marland Kitchen guaiFENesin (MUCINEX) 600 MG 12 hr tablet Take 1,200 mg by mouth 2 (two) times daily as needed.  . hydrochlorothiazide (HYDRODIURIL) 25 MG  tablet Take 1 tablet (25 mg total) by mouth daily.  . Hypertonic Nasal Wash (SINUS RINSE BOTTLE KIT NA) Place into the nose 2 (two) times daily.  Marland Kitchen losartan (COZAAR) 100 MG tablet Take 1 tablet (100 mg total) by mouth daily.  . metoprolol succinate (TOPROL-XL) 25 MG 24 hr tablet TAKE 1 TABLET BY MOUTH EVERY DAY  . montelukast (SINGULAIR) 10 MG tablet TAKE 1 TABLET BY MOUTH EVERY DAY  . pantoprazole (PROTONIX) 40 MG tablet Take 1 tablet (40 mg total) by mouth 2 (two) times daily.  Marland Kitchen Respiratory Therapy Supplies (FLUTTER) DEVI Use as directed  . SYMBICORT 160-4.5 MCG/ACT inhaler INHALE 2 PUFFS INTO THE LUNGS 2 TIMES DAILY.  Marland Kitchen therapeutic multivitamin-minerals (THERAGRAN-M) tablet Take 1 tablet by mouth daily.  Marland Kitchen UNABLE TO FIND Med Name: clear lungs otc 4 x daily  . [DISCONTINUED] aspirin EC 81 MG tablet Take 81 mg by mouth daily.  . [DISCONTINUED] doxycycline (VIBRA-TABS) 100 MG tablet One twice daily before meals with large glass of water  . [DISCONTINUED] losartan-hydrochlorothiazide (HYZAAR) 100-25 MG tablet TAKE 1 TABLET BY MOUTH EVERY DAY  . [DISCONTINUED] Psyllium 0.36 g CAPS Take 1 capsule by mouth daily.     Allergies:   Patient has no known allergies.   Social History   Tobacco Use  . Smoking status: Former Smoker    Packs/day: 2.00    Years: 11.00    Pack years: 22.00    Types: Cigarettes    Last attempt to quit: 12/19/1974    Years since quitting: 44.3  . Smokeless tobacco: Never Used  Substance Use Topics  . Alcohol use: Yes    Alcohol/week: 1.0 standard drinks    Types: 1 Glasses of wine per week  . Drug use: No     Family Hx: The patient's family history includes Arthritis in his sister; Atrial fibrillation in his father; Colon polyps in his father; Rheumatic fever in his mother; Stroke in his father. There is no history of Colon cancer, Esophageal cancer, Rectal cancer, or Stomach cancer.  ROS:   Please see the history of present illness.     All other systems  reviewed and are negative.   Prior CV studies:   The following studies were reviewed today:  Chest CT performed 10/08/2018  Labs/Other Tests and Data Reviewed:    EKG:  No ECG reviewed.  Recent Labs: 06/12/2018: Brain Natriuretic Peptide 38 10/05/2018: ALT 24; BUN 26; Creat 0.96; Hemoglobin 14.9; Platelets 284; Potassium 3.8; Sodium 141   Recent Lipid Panel Lab Results  Component Value Date/Time   CHOL 151 10/05/2018 08:17 AM   TRIG 82 10/05/2018 08:17 AM   HDL 46 10/05/2018 08:17 AM   CHOLHDL 3.3 10/05/2018 08:17 AM   LDLCALC 88 10/05/2018 08:17 AM    Wt Readings from Last 3 Encounters:  04/16/19 190 lb (86.2 kg)  01/22/19 197 lb (89.4 kg)  12/25/18 205 lb (93  kg)     Objective:    Vital Signs:  Ht _0  (1.753 m)   Wt 190 lb (86.2 kg)   BMI 28.06 kg/m    VITAL SIGNS:  reviewed GEN:  no acute distress  A full physical exam was not performed today since this was a virtual telemedicine video visit.  ASSESSMENT & PLAN:    1. Paroxysmal atrial fibrillation- history of PAF on Eliquis oral anticoagulation 2. Essential hypertension- history of essential hypertension on metoprolol, losartan hydrochlorothiazide.  He did not measure his vital signs today although he says normally runs in the 120/80 range 3. Hyperlipidemia- on atorvastatin with fasting lipid profile performed 10/05/2018 revealing total cholesterol of 51, LDL of 88 and HDL 46 4. Obstructive sleep apnea- he was on CPAP although with 50 pound weight loss he no longer requires this 5. Obesity- lost 50 pounds on weight watchers diet, feels clinically improved 6. Coronary calcification- chest CT performed 09/19/2018 revealed three-vessel coronary calcification.  He still asymptomatic.  Based on this, I am going to get an exercise Myoview stress test to risk stratify  COVID-19 Education: The signs and symptoms of COVID-19 were discussed with the patient and how to seek care for testing (follow up with PCP or  arrange E-visit).  The importance of social distancing was discussed today.  Time:   Today, I have spent 15 minutes with the patient with telehealth technology discussing the above problems.     Medication Adjustments/Labs and Tests Ordered: Current medicines are reviewed at length with the patient today.  Concerns regarding medicines are outlined above.   Tests Ordered: No orders of the defined types were placed in this encounter.   Medication Changes: No orders of the defined types were placed in this encounter.   Disposition:  Follow up in 1 year(s)  Signed, Quay Burow, MD  04/16/2019 8:28 AM    [facility]

## 2019-04-16 NOTE — Telephone Encounter (Signed)
Patient and/or DPR-approved person aware of AVS instructions and verbalized understanding. Letter including After Visit Summary and any other necessary documents to be mailed to the patient's address on file.  

## 2019-04-16 NOTE — Patient Instructions (Signed)
Medication Instructions:  Your physician recommends that you continue on your current medications as directed. Please refer to the Current Medication list given to you today.  If you need a refill on your cardiac medications before your next appointment, please call your pharmacy.   Lab work: NONE If you have labs (blood work) drawn today and your tests are completely normal, you will receive your results only by: Marland Kitchen MyChart Message (if you have MyChart) OR . A paper copy in the mail If you have any lab test that is abnormal or we need to change your treatment, we will call you to review the results.  Testing/Procedures: Your physician has requested that you have en exercise stress myoview. For further information please visit [email or web address]. Please follow instruction sheet, as given.   Follow-Up: At [facility], you and your health needs are our priority.  As part of our continuing mission to provide you with exceptional heart care, we have created designated Provider Care Teams.  These Care Teams include your primary Cardiologist (physician) and Advanced Practice Providers (APPs -  Physician Assistants and Nurse Practitioners) who all work together to provide you with the care you need, when you need it. You will need a follow up appointment in 12 months WITH DR. Gwenlyn Found.  Please call our office 2 months in advance to schedule this appointment.

## 2019-05-01 ENCOUNTER — Inpatient Hospital Stay (HOSPITAL_COMMUNITY): Admission: RE | Admit: 2019-05-01 | Payer: Medicare Other | Source: Ambulatory Visit

## 2019-05-14 ENCOUNTER — Other Ambulatory Visit: Payer: Self-pay | Admitting: Family Medicine

## 2019-05-14 DIAGNOSIS — J4541 Moderate persistent asthma with (acute) exacerbation: Secondary | ICD-10-CM

## 2019-05-15 ENCOUNTER — Ambulatory Visit (HOSPITAL_COMMUNITY)
Admission: RE | Admit: 2019-05-15 | Discharge: 2019-05-15 | Disposition: A | Discharge status: Home / Self Care | Payer: Medicare Other | Source: Ambulatory Visit | Attending: Cardiovascular Disease | Admitting: Cardiovascular Disease

## 2019-05-15 ENCOUNTER — Other Ambulatory Visit: Payer: Self-pay

## 2019-05-15 DIAGNOSIS — I2510 Atherosclerotic heart disease of native coronary artery without angina pectoris: Secondary | ICD-10-CM | POA: Diagnosis not present

## 2019-05-15 LAB — MYOCARDIAL PERFUSION IMAGING
LV dias vol: 97 mL (ref 62–150)
LV sys vol: 44 mL
Peak HR: 74 {beats}/min
Rest HR: 42 {beats}/min
SDS: 2
SRS: 0
SSS: 2
TID: 1.09

## 2019-05-15 MED ORDER — TECHNETIUM TC 99M TETROFOSMIN IV KIT
10.1000 | PACK | Freq: Once | INTRAVENOUS | Status: AC | PRN
  Administered 2019-05-15: 10.1 via INTRAVENOUS
  Filled 2019-05-15: qty 11

## 2019-05-15 MED ORDER — REGADENOSON 0.4 MG/5ML IV SOLN
0.4000 mg | Freq: Once | INTRAVENOUS | Status: AC
  Administered 2019-05-15: 0.4 mg via INTRAVENOUS

## 2019-05-15 MED ORDER — TECHNETIUM TC 99M TETROFOSMIN IV KIT
32.1000 | PACK | Freq: Once | INTRAVENOUS | Status: AC | PRN
  Administered 2019-05-15: 32.1 via INTRAVENOUS
  Filled 2019-05-15: qty 33

## 2019-06-27 ENCOUNTER — Other Ambulatory Visit: Payer: Self-pay

## 2019-06-27 ENCOUNTER — Encounter: Payer: Self-pay | Admitting: Internal Medicine

## 2019-06-27 ENCOUNTER — Ambulatory Visit: Payer: Medicare Other | Admitting: Internal Medicine

## 2019-06-27 DIAGNOSIS — J4540 Moderate persistent asthma, uncomplicated: Secondary | ICD-10-CM | POA: Diagnosis not present

## 2019-06-27 MED ORDER — BUDESONIDE-FORMOTEROL FUMARATE 80-4.5 MCG/ACT IN AERO: 2.0000 | INHALATION_SPRAY | Freq: Two times a day (BID) | RESPIRATORY_TRACT | 0 refills | Status: DC

## 2019-06-27 MED ORDER — BUDESONIDE-FORMOTEROL FUMARATE 80-4.5 MCG/ACT IN AERO: 2.0000 | INHALATION_SPRAY | Freq: Two times a day (BID) | RESPIRATORY_TRACT | 11 refills | Status: DC

## 2019-06-27 NOTE — Assessment & Plan Note (Addendum)
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 PFT's  08/13/2018  FEV1 1.85 (63 % ) ratio 62   p symb 160/spiriva  And saba   prior to study with DLCO  75/74c % corrects to 108  % for alv volume   - 09/24/2018   try off spiriva > no worse as of 12/25/2018  - 06/27/2019  After extensive coaching inhaler device,  effectiveness =    90% with hfa > try reduce symb to 80 2bid to reduce flares of purulent tracheobronchitis   Despite a typical flare of cough with purulent sputum he is wheeze free today so doubt he really needs the 160 ICS strength and should do better with exac with the 80   I had an extended discussion with the patient reviewing all relevant studies completed to date and  lasting 15 to 20 minutes of a 25 minute visit    See device teaching which extended face to face time for this visit.  Each maintenance medication was reviewed in detail including emphasizing most importantly the difference between maintenance and prns and under what circumstances the prns are to be triggered using an action plan format that is not reflected in the computer generated alphabetically organized AVS which I have not found useful in most complex patients, especially with respiratory illnesses  Please see AVS for specific instructions unique to this visit that I personally wrote and verbalized to the the pt in detail and then reviewed with pt  by my nurse highlighting any  changes in therapy recommended at today's visit to their plan of care.

## 2019-06-27 NOTE — Addendum Note (Signed)
Addended by: Parke Poisson E on: 06/27/2019 10:31 AM   Modules accepted: Orders

## 2019-06-27 NOTE — Progress Notes (Signed)
Mario [name], male    DOB: 1946/02/02, 73 y.o.   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 allergy in 2000 for placed on shots x maybe a year but no change in maint therapy rx which seemed to help but freq flares changed from symbicort/spiriva  to trelegy then much worse while on trelegy which was around 03/2018 with cough/ wheeze>  On his own switched back to symbicort/spiriva around 06/25/18 and referred to pulmonary clinic 07/05/2018 by Dr   Dennard Schaumann.    History of Present Illness  07/05/2018  / 1st office eval / bronchiectasis in setting of chronic asthma  Chief Complaint  Patient presents with  . Pulmonary Consult    Referred by [name]. Pt c/o cough and SOB x 3 months. He is coughing up clear sputum, esp worse in the am. He uses proair 1-2 x daily on average and has neb with albuterol that he rarely uses.   has sensation "lungs full of congestion" but doesn't cough much up and what he does is clear  cpap helps at hs / sleeps  on R side and bed is flat/ on left side side down wheezing keeps him up even on cpap Last pred 10 days prior to OV  And not better whereas previously worked great. Finished the abx about the time of the last ct 06/26/18  rec Bronchiectasis =   you have scarring of your bronchial tubes which means that they don't function perfectly normally and mucus tends to pool in certain areas of your lung which can cause pneumonia and further scarring of your lung and bronchial tubes Whenever you develop cough congestion take mucinex or mucinex dm > these will help keep the mucus loose and flowing but  if your condition worsens you need to seek help immediately preferably here or somewhere inside the [facility] system to compare xrays ( worse = darker or bloody mucus or pain on breathing in)   Use the flutter valve as much as possible Protonix 40 mg Take 30- 60 min before your first and last meals of the day  GERD (REFLUX)    schedule sinus CT > pos > ENT eval req Please schedule a follow up office visit in 4 weeks, sooner if needed with PFTs on return    08/13/2018  f/u ov/ re: chronic asthma vs ACOS vs obst bronchiectasis  Chief Complaint  Patient presents with  . Follow-up    Pt states he feels like he is only some better since last visit. Pt has c/o SOB with exertion, cough with clear mucus, occ. chest tightness.  Dyspnea:  MMRC1 = can walk nl pace, flat grade, can't hurry or go uphills or steps s sob   Cough: since 08/10/18 was better p ent rx, some clear mucus now   Sleeping: ok on 3 inch blocks SABA use: albuterol hs only  rec Plan A = Automatic = symbiocort 160 x 2 and spiriva x 2 pffs  first thing in am  then 12 hours later Symbiort x 160 mg Work on inhaler technique:  Plan B = Backup Only use your albuterol as a rescue medication Plan C = Crisis - only use your albuterol nebulizer if you first try Plan B and it fails to help > ok to use the nebulizer up to every 4 hours but if start needing it regularly call for immediate appointment     09/24/2018  f/u ov/ re: chronic asthma vs acos vs obst bronchiectasis assoc with chronic rhinitis/sinusitis under ent care Chief Complaint  Patient presents with  . Follow-up    Cough has improved some. He states his breathing is doing well. He has used his albuterol inhaler x 1 since the last visit. He rarely uses neb.   Dyspnea:  Walks 30 min on a track  Better and better ex tol  Cough: better then worse x 2 weeks esp p stirs  But not using symb first thing  Sleeping: fine on 3 in blocks  SABA use: only used once as hfa/ not neb   02: no 02 rec Be sure to take the am dose dose of symbicort 160 x 2 first thing in am and leave off spiriva (think of it like high octane fuel)  For thick mucus/ cough / congestion > mucinex dm up to 1200 mg every 12 hours with glass of water and the flutter valve as much as possible  For nasty mucus >  Doxycyline 100 mg twice daily x 10 day course (refillable so keep on file)      12/25/2018  f/u ov/ re:  Chronic asthma vs acos vs obst bronchiectasis, maint symb Chief Complaint  Patient presents with  . Follow-up    He has had some cough for the past 10 days- prod with clear sputum. He rarely uses his albuterol inhaler or neb.  Dyspnea:  More limited by knees than sob/ plans statinary bike Cough: mostly mucoid, better on mucinex not using flutter  Sleeping: 3 inch blocks fine  SABA use:  No saba  02: none  Nasal symtoms better with rinses / flonase per ENT  rec No change rx   06/27/2019  f/u ov/ re: AB/ obst bronchiectasis flared x 4 d prior to [place]  Patient presents with  . Follow-up    Cough with green sputum- started on doxy 4 days ago. He is using his albuterol inhaler once per day on average and has not needed neb.   Dyspnea:  Walking 20 min interval training  Cough: acutely worse x 4 days s systemic symptoms Sleeping: 3 in bed blocks sleeping well  SABA use: once a day typically p supper in recliner  02: none   No obvious day to day or daytime variability or assoc  mucus plugs or hemoptysis or cp or chest tightness, subjective wheeze or overt sinus or hb symptoms.   Sleeping ok  without nocturnal  or early am exacerbation  of respiratory  c/o's or need for noct saba. Also denies any obvious fluctuation of symptoms with weather or environmental changes or other aggravating or alleviating factors except as outlined above   No unusual exposure hx or h/o childhood pna/ asthma or knowledge of premature birth.  Current Allergies, Complete Past Medical  History, Past Surgical History, Family History, and Social History were reviewed in [employer] record.  ROS  The following are not active complaints unless bolded Hoarseness, sore throat, dysphagia, dental problems, itching, sneezing,  nasal congestion or discharge of excess mucus or purulent secretions, ear ache,  fever, chills, sweats, unintended wt loss, intentional  or wt gain, classically pleuritic or exertional cp,  orthopnea pnd or arm/hand swelling  or leg swelling, presyncope, palpitations, abdominal pain, anorexia, nausea, vomiting, diarrhea  or change in bowel habits or change in bladder habits, change in stools or change in urine, dysuria, hematuria,  rash, arthralgias, visual complaints, headache, numbness, weakness or ataxia or problems with walking or coordination,  change in mood or  memory.        Current Meds  Medication Sig  . albuterol (PROVENTIL) (2.5 MG/3ML) 0.083% nebulizer solution Take 3 mLs (2.5 mg total) by nebulization every 6 (six) hours as needed for wheezing or shortness of breath.  Marland Kitchen apixaban (ELIQUIS) 5 MG TABS tablet Take 1 tablet (5 mg total) by mouth 2 (two) times daily.  Marland Kitchen atorvastatin (LIPITOR) 40 MG tablet Take 40 mg by mouth daily.  . Cholecalciferol (VITAMIN D3) 125 MCG (5000 UT) TABS Take by mouth.  . Coenzyme Q10 (CO Q-10) 100 MG CAPS Take 1 capsule by mouth daily.  Marland Kitchen doxycycline (VIBRA-TABS) 100 MG tablet Take 1 tablet by mouth 2 (two) times a day.  . fluticasone (FLONASE) 50 MCG/ACT nasal spray Place into the nose.  Marland Kitchen glucosamine-chondroitin 500-400 MG tablet Take 1 tablet by mouth 3 (three) times daily.  Marland Kitchen guaiFENesin (MUCINEX) 600 MG 12 hr tablet Take 1,200 mg by mouth 2 (two) times daily as needed.  . hydrochlorothiazide (HYDRODIURIL) 25 MG tablet Take 1 tablet (25 mg total) by mouth daily.  . Hypertonic Nasal Wash (SINUS RINSE BOTTLE KIT NA) Place into the nose 2 (two) times daily.  Marland Kitchen losartan (COZAAR) 100 MG tablet Take 1  tablet (100 mg total) by mouth daily.  . metoprolol succinate (TOPROL-XL) 25 MG 24 hr tablet TAKE 1 TABLET BY MOUTH EVERY DAY  . montelukast (SINGULAIR) 10 MG tablet TAKE 1 TABLET BY MOUTH EVERY DAY  . pantoprazole (PROTONIX) 40 MG tablet Take 1 tablet (40 mg total) by mouth 2 (two) times daily.  Marland Kitchen PROAIR HFA 108 (90 Base) MCG/ACT inhaler TAKE 2 PUFFS BY MOUTH EVERY 6 HOURS AS NEEDED FOR WHEEZE OR SHORTNESS OF BREATH  . Respiratory Therapy Supplies (FLUTTER) DEVI Use as directed  . SYMBICORT 160-4.5 MCG/ACT inhaler INHALE 2 PUFFS INTO THE LUNGS 2 TIMES DAILY.  Marland Kitchen therapeutic multivitamin-minerals (THERAGRAN-M) tablet Take 1 tablet by mouth daily.  Marland Kitchen UNABLE TO FIND Med Name: clear lungs otc 4 x daily                       Objective:       pleasant amb wm nad     06/27/2019         192  12/25/2018         205  09/24/2018       212  08/13/2018       218   07/05/18 224 lb (101.6 kg)  06/12/18 230 lb (104.3 kg)  05/25/18 233 lb (105.7 kg)     Vital signs reviewed - Note on arrival 02 sats  96% on RA     HEENT: nl dentition / oropharynx. Nl external ear canals without cough reflex -  Mild bilateral non-specific turbinate edema     NECK :  without JVD/Nodes/TM/ nl carotid upstrokes bilaterally   LUNGS: no acc muscle use,  Min barrel  contour chest wall with bilateral  slightly decreased bs s audible wheeze and  without cough on insp or exp maneuver and  min  Hyperresonant  to  percussion bilaterally     CV:  RRR  no s3 or murmur or increase in P2, and no edema   ABD:  soft and nontender with pos end  insp Hoover's  in the supine position. No bruits or organomegaly appreciated, bowel sounds nl  MS:   Nl gait/  ext warm without deformities, calf tenderness, cyanosis or clubbing No obvious joint restrictions   SKIN: warm and dry without lesions    NEURO:  alert, approp, nl sensorium with  no motor or cerebellar deficits apparent.                Assessment

## 2019-06-27 NOTE — Patient Instructions (Signed)
Try symbicort 80 Take 2 puffs first thing in am and then another 2 puffs about 12 hours later.     Only use your albuterol as a rescue medication to be used if you can't catch your breath by resting or doing a relaxed purse lip breathing pattern.  - The less you use it, the better it will work when you need it. - Ok to use up to 2 puffs  every 4 hours if you must but call for immediate appointment if use goes up over your usual need - Don't leave home without it !!  (think of it like the spare tire for your car)    Please schedule a follow up visit in 6 months but call sooner if needed

## 2019-07-25 ENCOUNTER — Other Ambulatory Visit: Payer: Self-pay | Admitting: Family Medicine

## 2019-08-09 ENCOUNTER — Other Ambulatory Visit: Payer: Self-pay | Admitting: Family Medicine

## 2019-08-21 ENCOUNTER — Other Ambulatory Visit: Payer: Self-pay | Admitting: Family Medicine

## 2019-09-13 ENCOUNTER — Inpatient Hospital Stay (HOSPITAL_COMMUNITY)
Admission: EM | Admit: 2019-09-13 | Discharge: 2019-09-16 | DRG: 418 | Disposition: A | Discharge status: Home / Self Care | Payer: Medicare Other | Attending: Internal Medicine | Admitting: Internal Medicine

## 2019-09-13 ENCOUNTER — Emergency Department (HOSPITAL_COMMUNITY): Payer: Medicare Other

## 2019-09-13 ENCOUNTER — Other Ambulatory Visit: Payer: Self-pay

## 2019-09-13 ENCOUNTER — Encounter (HOSPITAL_COMMUNITY): Payer: Self-pay | Admitting: Emergency Medicine

## 2019-09-13 DIAGNOSIS — Z7951 Long term (current) use of inhaled steroids: Secondary | ICD-10-CM | POA: Diagnosis not present

## 2019-09-13 DIAGNOSIS — I2510 Atherosclerotic heart disease of native coronary artery without angina pectoris: Secondary | ICD-10-CM | POA: Diagnosis not present

## 2019-09-13 DIAGNOSIS — G4733 Obstructive sleep apnea (adult) (pediatric): Secondary | ICD-10-CM | POA: Diagnosis not present

## 2019-09-13 DIAGNOSIS — K8066 Calculus of gallbladder and bile duct with acute and chronic cholecystitis without obstruction: Secondary | ICD-10-CM | POA: Diagnosis not present

## 2019-09-13 DIAGNOSIS — N289 Disorder of kidney and ureter, unspecified: Secondary | ICD-10-CM | POA: Insufficient documentation

## 2019-09-13 DIAGNOSIS — Z87891 Personal history of nicotine dependence: Secondary | ICD-10-CM | POA: Diagnosis not present

## 2019-09-13 DIAGNOSIS — K760 Fatty (change of) liver, not elsewhere classified: Secondary | ICD-10-CM

## 2019-09-13 DIAGNOSIS — R1011 Right upper quadrant pain: Secondary | ICD-10-CM | POA: Diagnosis not present

## 2019-09-13 DIAGNOSIS — E871 Hypo-osmolality and hyponatremia: Secondary | ICD-10-CM | POA: Diagnosis present

## 2019-09-13 DIAGNOSIS — J449 Chronic obstructive pulmonary disease, unspecified: Secondary | ICD-10-CM | POA: Diagnosis present

## 2019-09-13 DIAGNOSIS — J4550 Severe persistent asthma, uncomplicated: Secondary | ICD-10-CM | POA: Diagnosis present

## 2019-09-13 DIAGNOSIS — Z79899 Other long term (current) drug therapy: Secondary | ICD-10-CM

## 2019-09-13 DIAGNOSIS — Z860100 Personal history of colon polyps, unspecified: Secondary | ICD-10-CM

## 2019-09-13 DIAGNOSIS — K810 Acute cholecystitis: Secondary | ICD-10-CM

## 2019-09-13 DIAGNOSIS — K828 Other specified diseases of gallbladder: Secondary | ICD-10-CM | POA: Diagnosis present

## 2019-09-13 DIAGNOSIS — E782 Mixed hyperlipidemia: Secondary | ICD-10-CM | POA: Diagnosis not present

## 2019-09-13 DIAGNOSIS — Z7982 Long term (current) use of aspirin: Secondary | ICD-10-CM | POA: Diagnosis not present

## 2019-09-13 DIAGNOSIS — K819 Cholecystitis, unspecified: Secondary | ICD-10-CM | POA: Diagnosis present

## 2019-09-13 DIAGNOSIS — K8000 Calculus of gallbladder with acute cholecystitis without obstruction: Secondary | ICD-10-CM

## 2019-09-13 DIAGNOSIS — E663 Overweight: Secondary | ICD-10-CM | POA: Diagnosis present

## 2019-09-13 DIAGNOSIS — K449 Diaphragmatic hernia without obstruction or gangrene: Secondary | ICD-10-CM | POA: Diagnosis not present

## 2019-09-13 DIAGNOSIS — J4540 Moderate persistent asthma, uncomplicated: Secondary | ICD-10-CM | POA: Diagnosis present

## 2019-09-13 DIAGNOSIS — K219 Gastro-esophageal reflux disease without esophagitis: Secondary | ICD-10-CM

## 2019-09-13 DIAGNOSIS — K660 Peritoneal adhesions (postprocedural) (postinfection): Secondary | ICD-10-CM | POA: Diagnosis not present

## 2019-09-13 DIAGNOSIS — J4489 Other specified chronic obstructive pulmonary disease: Secondary | ICD-10-CM | POA: Diagnosis present

## 2019-09-13 DIAGNOSIS — K5730 Diverticulosis of large intestine without perforation or abscess without bleeding: Secondary | ICD-10-CM

## 2019-09-13 DIAGNOSIS — I480 Paroxysmal atrial fibrillation: Secondary | ICD-10-CM | POA: Diagnosis present

## 2019-09-13 DIAGNOSIS — K862 Cyst of pancreas: Secondary | ICD-10-CM | POA: Insufficient documentation

## 2019-09-13 DIAGNOSIS — Z6828 Body mass index (BMI) 28.0-28.9, adult: Secondary | ICD-10-CM | POA: Diagnosis not present

## 2019-09-13 DIAGNOSIS — J479 Bronchiectasis, uncomplicated: Secondary | ICD-10-CM | POA: Diagnosis not present

## 2019-09-13 DIAGNOSIS — K8012 Calculus of gallbladder with acute and chronic cholecystitis without obstruction: Principal | ICD-10-CM | POA: Diagnosis present

## 2019-09-13 DIAGNOSIS — I10 Essential (primary) hypertension: Secondary | ICD-10-CM | POA: Diagnosis not present

## 2019-09-13 DIAGNOSIS — E785 Hyperlipidemia, unspecified: Secondary | ICD-10-CM | POA: Diagnosis not present

## 2019-09-13 DIAGNOSIS — Z20828 Contact with and (suspected) exposure to other viral communicable diseases: Secondary | ICD-10-CM | POA: Diagnosis present

## 2019-09-13 DIAGNOSIS — Z7901 Long term (current) use of anticoagulants: Secondary | ICD-10-CM | POA: Diagnosis not present

## 2019-09-13 DIAGNOSIS — K769 Liver disease, unspecified: Secondary | ICD-10-CM | POA: Diagnosis not present

## 2019-09-13 DIAGNOSIS — K8020 Calculus of gallbladder without cholecystitis without obstruction: Secondary | ICD-10-CM

## 2019-09-13 LAB — BASIC METABOLIC PANEL
Anion gap: 9 (ref 5–15)
BUN: 23 mg/dL (ref 8–23)
CO2: 27 mmol/L (ref 22–32)
Calcium: 9.1 mg/dL (ref 8.9–10.3)
Chloride: 96 mmol/L — ABNORMAL LOW (ref 98–111)
Creatinine, Ser: 0.82 mg/dL (ref 0.61–1.24)
GFR calc Af Amer: >60 mL/min (ref >60)
GFR calc non Af Amer: >60 mL/min (ref >60)
Glucose, Bld: 114 mg/dL — ABNORMAL HIGH (ref 70–99)
Potassium: 4 mmol/L (ref 3.5–5.1)
Sodium: 132 mmol/L — ABNORMAL LOW (ref 135–145)

## 2019-09-13 LAB — LIPASE, BLOOD: Lipase: 36 U/L (ref 11–51)

## 2019-09-13 LAB — CBC
HCT: 46.2 % (ref 39.0–52.0)
Hemoglobin: 15.3 g/dL (ref 13.0–17.0)
MCH: 31.2 pg (ref 26.0–34.0)
MCHC: 33.1 g/dL (ref 30.0–36.0)
MCV: 94.3 fL (ref 80.0–100.0)
Platelets: 256 10*3/uL (ref 150–400)
RBC: 4.9 MIL/uL (ref 4.22–5.81)
RDW: 14.4 % (ref 11.5–15.5)
WBC: 13.2 10*3/uL — ABNORMAL HIGH (ref 4.0–10.5)
nRBC: 0 % (ref 0.0–0.2)

## 2019-09-13 LAB — HEPATIC FUNCTION PANEL
ALT: 27 U/L (ref 0–44)
AST: 23 U/L (ref 15–41)
Albumin: 3.8 g/dL (ref 3.5–5.0)
Alkaline Phosphatase: 45 U/L (ref 38–126)
Bilirubin, Direct: 0.3 mg/dL — ABNORMAL HIGH (ref 0.0–0.2)
Indirect Bilirubin: 1.3 mg/dL — ABNORMAL HIGH (ref 0.3–0.9)
Total Bilirubin: 1.6 mg/dL — ABNORMAL HIGH (ref 0.3–1.2)
Total Protein: 6.7 g/dL (ref 6.5–8.1)

## 2019-09-13 LAB — TROPONIN I (HIGH SENSITIVITY)
Troponin I (High Sensitivity): 5 ng/L (ref <18)
Troponin I (High Sensitivity): 5 ng/L (ref <18)

## 2019-09-13 LAB — SARS CORONAVIRUS 2 BY RT PCR (HOSPITAL ORDER, PERFORMED IN ~~LOC~~ HOSPITAL LAB): SARS Coronavirus 2: NEGATIVE

## 2019-09-13 MED ORDER — LIDOCAINE VISCOUS HCL 2 % MT SOLN
15.0000 mL | Freq: Once | OROMUCOSAL | Status: AC
  Administered 2019-09-13: 15 mL via ORAL
  Filled 2019-09-13: qty 15

## 2019-09-13 MED ORDER — PSYLLIUM 95 % PO PACK
1.0000 | PACK | Freq: Every day | ORAL | Status: DC
  Administered 2019-09-14 – 2019-09-16 (×3): 1 via ORAL
  Filled 2019-09-13 (×3): qty 1

## 2019-09-13 MED ORDER — ACETAMINOPHEN 325 MG PO TABS
325.0000 mg | ORAL_TABLET | Freq: Four times a day (QID) | ORAL | Status: DC | PRN
  Administered 2019-09-14: 650 mg via ORAL
  Filled 2019-09-13: qty 2

## 2019-09-13 MED ORDER — HYDROCORTISONE 1 % EX CREA: 1.0000 "application " | TOPICAL_CREAM | Freq: Three times a day (TID) | CUTANEOUS | Status: DC | PRN

## 2019-09-13 MED ORDER — BUPIVACAINE LIPOSOME 1.3 % IJ SUSP: 20.0000 mL | Freq: Once | INTRAMUSCULAR | Status: DC

## 2019-09-13 MED ORDER — MAGIC MOUTHWASH
15.0000 mL | Freq: Four times a day (QID) | ORAL | Status: DC | PRN
  Filled 2019-09-13: qty 15

## 2019-09-13 MED ORDER — ACETAMINOPHEN 650 MG RE SUPP: 650.0000 mg | Freq: Four times a day (QID) | RECTAL | Status: DC | PRN

## 2019-09-13 MED ORDER — LOSARTAN POTASSIUM 50 MG PO TABS
100.0000 mg | ORAL_TABLET | Freq: Once | ORAL | Status: AC
  Administered 2019-09-13: 100 mg via ORAL
  Filled 2019-09-13: qty 2

## 2019-09-13 MED ORDER — MENTHOL 3 MG MT LOZG: 1.0000 | LOZENGE | OROMUCOSAL | Status: DC | PRN

## 2019-09-13 MED ORDER — CHLORHEXIDINE GLUCONATE CLOTH 2 % EX PADS: 6.0000 | MEDICATED_PAD | Freq: Once | CUTANEOUS | Status: DC

## 2019-09-13 MED ORDER — HYDROCHLOROTHIAZIDE 12.5 MG PO CAPS
25.0000 mg | ORAL_CAPSULE | Freq: Once | ORAL | Status: AC
  Administered 2019-09-13: 25 mg via ORAL
  Filled 2019-09-13: qty 2

## 2019-09-13 MED ORDER — LIP MEDEX EX OINT
1.0000 "application " | TOPICAL_OINTMENT | Freq: Two times a day (BID) | CUTANEOUS | Status: DC
  Administered 2019-09-14 – 2019-09-16 (×5): 1 via TOPICAL
  Filled 2019-09-13 (×3): qty 7

## 2019-09-13 MED ORDER — SODIUM CHLORIDE 0.9 % IV SOLN: 2.0000 g | INTRAVENOUS | Status: DC

## 2019-09-13 MED ORDER — METHOCARBAMOL 1000 MG/10ML IJ SOLN
1000.0000 mg | Freq: Four times a day (QID) | INTRAVENOUS | Status: DC | PRN
  Filled 2019-09-13: qty 10

## 2019-09-13 MED ORDER — METHOCARBAMOL 500 MG PO TABS: 1000.0000 mg | ORAL_TABLET | Freq: Four times a day (QID) | ORAL | Status: DC | PRN

## 2019-09-13 MED ORDER — ALUM & MAG HYDROXIDE-SIMETH 200-200-20 MG/5ML PO SUSP: 30.0000 mL | Freq: Four times a day (QID) | ORAL | Status: DC | PRN

## 2019-09-13 MED ORDER — GABAPENTIN 100 MG PO CAPS: 200.0000 mg | ORAL_CAPSULE | ORAL | Status: DC

## 2019-09-13 MED ORDER — PHENOL 1.4 % MT LIQD: 2.0000 | OROMUCOSAL | Status: DC | PRN

## 2019-09-13 MED ORDER — GUAIFENESIN-DM 100-10 MG/5ML PO SYRP: 10.0000 mL | ORAL_SOLUTION | ORAL | Status: DC | PRN

## 2019-09-13 MED ORDER — METOPROLOL TARTRATE 5 MG/5ML IV SOLN: 5.0000 mg | Freq: Four times a day (QID) | INTRAVENOUS | Status: DC | PRN

## 2019-09-13 MED ORDER — BISACODYL 10 MG RE SUPP: 10.0000 mg | Freq: Two times a day (BID) | RECTAL | Status: DC | PRN

## 2019-09-13 MED ORDER — ACETAMINOPHEN 500 MG PO TABS: 1000.0000 mg | ORAL_TABLET | ORAL | Status: DC

## 2019-09-13 MED ORDER — ALUM & MAG HYDROXIDE-SIMETH 200-200-20 MG/5ML PO SUSP
30.0000 mL | Freq: Once | ORAL | Status: AC
  Administered 2019-09-13: 30 mL via ORAL
  Filled 2019-09-13: qty 30

## 2019-09-13 MED ORDER — PIPERACILLIN-TAZOBACTAM 3.375 G IVPB
3.3750 g | Freq: Three times a day (TID) | INTRAVENOUS | Status: DC
  Administered 2019-09-14 – 2019-09-16 (×8): 3.375 g via INTRAVENOUS
  Filled 2019-09-13 (×8): qty 50

## 2019-09-13 MED ORDER — METRONIDAZOLE IN NACL 5-0.79 MG/ML-% IV SOLN: 500.0000 mg | INTRAVENOUS | Status: DC

## 2019-09-13 MED ORDER — HYDROCORTISONE (PERIANAL) 2.5 % EX CREA: 1.0000 "application " | TOPICAL_CREAM | Freq: Four times a day (QID) | CUTANEOUS | Status: DC | PRN

## 2019-09-13 NOTE — ED Notes (Signed)
Patient has a gold and blue top in the main lab 

## 2019-09-13 NOTE — H&P (Signed)
History and Physical    ROBERTS LARRALDE [id number] DOB: 07/01/1946 DOA: 09/13/2019  PCP: [name], MD  Patient coming from: home   Chief Complaint: upper abdominal pain  HPI: Mario [name] is a 73 y.o. male with medical history significant for symptomatic cholelithiasis, OSA, HTN, asthma, paroxysmal a fib, who presents with above.  Yesterday evening an hour or so after a meal patient developed a painful sensation upper abdomen/lower chest that was bilateral. Similar to previous gerd exacerbations. But stronger and lasted longer than normal. Had decreased appetite and 2 episodes of nbnb emesis. No diarrhea or constipation. No fevers. No cough. Not exertional, denies history of exertional chest pain. Did not radiate to back. Compliant with home medications. No known covid contacts. No history PE/DVT. Denies toxic habits. No dysuria or hematuria or increased urinary frequency. UTD colon cancer screening.   History biliary colic.  ED Course: labs, imaging  Review of Systems: As per HPI otherwise 10 point review of systems negative.    Past Medical History:  Diagnosis Date  . Asthma   . Biliary colic 123456  . COPD (chronic obstructive pulmonary disease) with chronic bronchitis ([place])   . Coronary artery calcification seen on CAT scan   . Cyst of pancreas    1 cm (7/18), repeat ct in 12 months.    . Hyperlipidemia   . Hypertension   . Obstructive sleep apnea   . Renal disorder   . Rhinitis   . Sleep apnea    pt denies sleep apnea-lost weight    Past Surgical History:  Procedure Laterality Date  . COLONOSCOPY  09/29/2014  . LITHOTRIPSY  2000   for kidney stone     reports that he quit smoking about 44 years ago. His smoking use included cigarettes. He has a 22.00 pack-year smoking history. He has never used smokeless tobacco. He reports current alcohol use of about 1.0 standard drinks of alcohol per week. He reports that he does not use drugs.  No Known Allergies   Family History  Problem Relation Age of Onset  . Rheumatic fever Mother   . Stroke Father   . Atrial fibrillation Father   . Colon polyps Father   . Arthritis Sister   . Colon cancer Neg Hx   . Esophageal cancer Neg Hx   . Rectal cancer Neg Hx   . Stomach cancer Neg Hx     Prior to Admission medications   Medication Sig Start Date End Date Taking? Authorizing Provider  albuterol (PROVENTIL) (2.5 MG/3ML) 0.083% nebulizer solution Take 3 mLs (2.5 mg total) by nebulization every 6 (six) hours as needed for wheezing or shortness of breath. 06/12/18  Yes Delsa Grana, PA-C  aspirin (ASPIRIN 81) 81 MG EC tablet Take 81 mg by mouth daily. Swallow whole.   Yes [provider]  atorvastatin (LIPITOR) 40 MG tablet Take 40 mg by mouth daily.   Yes [provider]  Cholecalciferol (VITAMIN D3) 125 MCG (5000 UT) TABS Take by mouth.   Yes [provider]  Coenzyme Q10 (CO Q-10) 100 MG CAPS Take 1 capsule by mouth daily.   Yes [provider]  dextromethorphan-guaiFENesin (MUCINEX DM) 30-600 MG 12hr tablet Take 1 tablet by mouth at bedtime as needed for cough.   Yes [provider]  FIBER COMPLETE PO Take 1 tablet by mouth daily.   Yes [provider]  glucosamine-chondroitin 500-400 MG tablet Take 1 tablet by mouth 3 (three) times daily.  Yes [provider]  hydrochlorothiazide (HYDRODIURIL) 25 MG tablet Take 1 tablet (25 mg total) by mouth daily. 11/05/18  Yes [name], MD  losartan (COZAAR) 100 MG tablet Take 1 tablet (100 mg total) by mouth daily. 11/05/18  Yes [name], MD  metoprolol succinate (TOPROL-XL) 25 MG 24 hr tablet TAKE 1 TABLET BY MOUTH EVERY DAY Patient taking differently: Take 25 mg by mouth daily.  01/30/19  Yes [name], MD  montelukast (SINGULAIR) 10 MG tablet TAKE 1 TABLET BY MOUTH EVERY DAY Patient taking differently: Take 10 mg by mouth daily.  08/21/19  Yes [name], MD   pantoprazole (PROTONIX) 40 MG tablet TAKE 1 TABLET BY MOUTH TWICE A DAY Patient taking differently: Take 40 mg by mouth daily.  07/25/19  Yes [name], MD  PROAIR HFA 108 [phone] Base) MCG/ACT inhaler TAKE 2 PUFFS BY MOUTH EVERY 6 HOURS AS NEEDED FOR WHEEZE OR SHORTNESS OF BREATH Patient taking differently: Inhale 2 puffs into the lungs every 6 (six) hours as needed for wheezing or shortness of breath.  05/14/19  Yes Pickard, [name], MD  SYMBICORT 160-4.5 MCG/ACT inhaler INHALE 2 PUFFS INTO THE LUNGS 2 TIMES DAILY. Patient taking differently: Inhale 2 puffs into the lungs 2 (two) times daily.  08/09/19  Yes [name], MD  therapeutic multivitamin-minerals [facility]) tablet Take 1 tablet by mouth daily.   Yes [provider]  UNABLE TO FIND Med Name: clear lungs otc 4 x daily   Yes [provider]  apixaban (ELIQUIS) 5 MG TABS tablet Take 1 tablet (5 mg total) by mouth 2 (two) times daily. 04/16/19   [name], MD  budesonide-formoterol (SYMBICORT) 80-4.5 MCG/ACT inhaler Inhale 2 puffs into the lungs 2 (two) times daily. 06/27/19   Tanda Rockers, MD  budesonide-formoterol (SYMBICORT) 80-4.5 MCG/ACT inhaler Inhale 2 puffs into the lungs 2 (two) times daily. Patient not taking: Reported on 09/13/2019 06/27/19   Tanda Rockers, MD  Respiratory Therapy Supplies (FLUTTER) DEVI Use as directed 07/05/18   Tanda Rockers, MD    Physical Exam: Vitals:   09/13/19 1730 09/13/19 1848 09/13/19 1900 09/13/19 1930  BP: (!) 174/85 (!) 155/70 140/73 135/74  Pulse: (!) 55 67 62 (!) 52  Resp: 11 13 15 18   Temp:      TempSrc:      SpO2: 100% 97% 98% 97%    Constitutional: No acute distress Head: Atraumatic Eyes: Conjunctiva clear ENM: Moist mucous membranes. Normal dentition.  Neck: Supple Respiratory: Clear to auscultation bilaterally, no wheezing/rales/rhonchi. Normal respiratory effort. No accessory muscle use. . Cardiovascular: Regular rate and rhythm. No  murmurs/rubs/gallops. Abdomen: Non-tender, non-distended. No masses. No rebound or guarding. Positive bowel sounds. Musculoskeletal: No joint deformity upper and lower extremities. Normal ROM, no contractures. Normal muscle tone.  Skin: No rashes, lesions, or ulcers.  Extremities: No peripheral edema. Palpable peripheral pulses. Neurologic: Alert, moving all 4 extremities. Psychiatric: Normal insight and judgement.   Labs on Admission: I have personally reviewed following labs and imaging studies  CBC: Recent Labs  Lab 09/13/19 1318  WBC 13.2*  HGB 15.3  HCT 46.2  MCV 94.3  PLT 123456   Basic Metabolic Panel: Recent Labs  Lab 09/13/19 1318  NA 132*  K 4.0  CL 96*  CO2 27  GLUCOSE 114*  BUN 23  CREATININE 0.82  CALCIUM 9.1   GFR: CrCl cannot be calculated (Unknown ideal weight.). Liver Function Tests: Recent Labs  Lab 09/13/19 1730  AST 23  ALT 27  ALKPHOS 45  BILITOT 1.6*  PROT 6.7  ALBUMIN 3.8   Recent Labs  Lab 09/13/19 1730  LIPASE 36   No results for input(s): AMMONIA in the last 168 hours. Coagulation Profile: No results for input(s): INR, PROTIME in the last 168 hours. Cardiac Enzymes: No results for input(s): CKTOTAL, CKMB, CKMBINDEX, TROPONINI in the last 168 hours. BNP (last 3 results) No results for input(s): PROBNP in the last 8760 hours. HbA1C: No results for input(s): HGBA1C in the last 72 hours. CBG: No results for input(s): GLUCAP in the last 168 hours. Lipid Profile: No results for input(s): CHOL, HDL, LDLCALC, TRIG, CHOLHDL, LDLDIRECT in the last 72 hours. Thyroid Function Tests: No results for input(s): TSH, T4TOTAL, FREET4, T3FREE, THYROIDAB in the last 72 hours. Anemia Panel: No results for input(s): VITAMINB12, FOLATE, FERRITIN, TIBC, IRON, RETICCTPCT in the last 72 hours. Urine analysis:    Component Value Date/Time   COLORURINE YELLOW 12/12/2012 0908   APPEARANCEUR CLEAR 12/12/2012 0908   LABSPEC 1.013 12/12/2012 0908    PHURINE 7.5 12/12/2012 0908   GLUCOSEU NEGATIVE 12/12/2012 0908   HGBUR NEGATIVE 12/12/2012 0908   BILIRUBINUR NEGATIVE 12/12/2012 0908   KETONESUR NEGATIVE 12/12/2012 0908   PROTEINUR NEGATIVE 12/12/2012 0908   UROBILINOGEN 1.0 12/12/2012 0908   NITRITE NEGATIVE 12/12/2012 0908   LEUKOCYTESUR NEGATIVE 12/12/2012 0908    Radiological Exams on Admission: Dg Chest 2 View  Result Date: 09/13/2019 CLINICAL DATA:  Chest pain EXAM: CHEST - 2 VIEW COMPARISON:  06/13/2018 FINDINGS: The heart size and mediastinal contours are stable. Chronic scarring and bronchiectasis within the bilateral lung bases, not significantly changed from prior. No new focal airspace consolidation. No pleural effusion. No pneumothorax. The visualized skeletal structures are unremarkable. IMPRESSION: Chronic bibasilar scarring and bronchiectasis. No new/acute cardiopulmonary findings. Electronically Signed   By: Davina Poke M.D.   On: 09/13/2019 13:55   US Abdomen Limited Ruq  Result Date: 09/13/2019 CLINICAL DATA:  Right upper quadrant pain. EXAM: ULTRASOUND ABDOMEN LIMITED RIGHT UPPER QUADRANT COMPARISON:  None. FINDINGS: Gallbladder: The gallbladder wall measures up to 1 cm in thickness. Multiple stones are seen in the gallbladder. A positive Murphy's sign is reported. The results sheet was not marked regarding a Murphy's sign but I did speak to the sonographer. No pericholecystic fluid. Common bile duct: Diameter: 5 mm Liver: Increased echogenicity in the liver with no focal mass identified. Portal vein is patent on color Doppler imaging with normal direction of blood flow towards the liver. Other: None. IMPRESSION: 1. Gallbladder wall thickening, stones, and a positive Murphy's sign are most consistent with acute cholecystitis in the appropriate clinical setting. 2. Probable hepatic steatosis. Electronically Signed   By: Dorise Bullion III M.D   On: 09/13/2019 18:25    EKG: Independently reviewed. Nsr, pvc, lad, no  ischemic changes  Assessment/Plan Principal Problem:   Acute calculous cholecystitis Active Problems:   Essential hypertension   Obstructive sleep apnea   Hyperlipidemia   COPD (chronic obstructive pulmonary disease) with chronic bronchitis (HCC)   Paroxysmal atrial fibrillation (HCC)   Asthma, chronic, moderate persistent vs ACOS in former smoker    Coronary artery calcification seen on CAT scan   Chronic anticoagulation   Hiatal hernia   Personal history of colonic polyps   Diverticulosis of sigmoid colon   Fatty liver   # Symptomatic biliary colic, possible cholecystitis - history biliary colic. Symptoms consistent w/ colic. U/s findings and positive murphy  sign suggestive of cholecystitis, though abdominal pain has resolved spontaneously. olabs sig for mildly elevated wbcs, t bili 1.6.  gen surg has seen, advising admission for abx, cholecystectomy - clear liquid diet for now - zosyn per gen surg - hold apixiban - SCDs - low-risk nuclear stress 4 months ago. No new ischemic symptoms. Risk of death per as risk calculator 0.1, lower than average. Normal systolic function on [id number] tte. No ischemic changes seen on EKG. Think further cardiac w/u prior to surgery not necessary.\  # Hyponatremia - new, mild (132). Etiology unclear, possibly 2/2 pain, recent nausea/vomiting - repeat sodium in AM, further w/u if down-trending  # HTN - initial bp elevated, now wnl - resume home antihypertensives (metop, losartan, hctz) - cont home atorvastatin  # Asthma, moderate - cont home albuterol prn and substitute formulary equivalent for home symbicort  # paroxysmal a fib - here nsr. chads 2 vasc - hold eliquis as above, bridging anticoagulation does not appear to be indicated  DVT prophylaxis: scds Code Status: full  Family Communication: wife sharon  Disposition Plan: tbd  Consults called: gen surg   Admission status: med/surg    Desma Maxim MD [facility] Pager [phone]   If 7PM-7AM, please contact night-coverage [email or web address] Password [email or web address]  09/13/2019, 8:11 PM

## 2019-09-13 NOTE — ED Provider Notes (Signed)
[place] [facility] DEPT Provider Note   CSN: [id number] Arrival date & time: 09/13/19  1228     History   Chief Complaint Chief Complaint  Patient presents with   Chest Pain    HPI Mario [name] is a 73 y.o. male history of COPD, hypertension, here presenting with epigastric pain, chest pain.  Patient states that yesterday he ate some food and felt like he had some indigestion.  Describes it as epigastric pain radiated to his chest.  States that he feels nauseated but denies any vomiting.  He states that he felt like his hiatal hernia is acting up.  States that he is able to keep things down today but continues to have some epigastric pain. He states that he has some similar symptoms when he had gallstones but never had his gallbladder removed.  He sees Dr. Gwenlyn Found and had a nuclear medicine study earlier this year that was low risk.      The history is provided by the patient.    Past Medical History:  Diagnosis Date   Asthma    Biliary colic 123456   COPD (chronic obstructive pulmonary disease) with chronic bronchitis (HCC)    Coronary artery calcification seen on CAT scan    Cyst of pancreas    1 cm (7/18), repeat ct in 12 months.     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   Deviated nasal septum 07/31/2018   Asthma, chronic, moderate persistent vs ACOS in former smoker  07/06/2018   Bronchiectasis without complication ([place]) [id number]   Paroxysmal atrial fibrillation ([place]) 12/29/2017   COPD (chronic obstructive pulmonary disease)  with chronic bronchitis ([place])    Hyperlipidemia    Epigastric abdominal pain 12/12/2012   Obstructive sleep apnea    Essential hypertension 12/24/2009   KNEE PAIN, LEFT 12/24/2009   MEDIAL MENISCUS TEAR, LEFT 12/24/2009    Past Surgical History:  Procedure Laterality Date   COLONOSCOPY  09/29/2014   LITHOTRIPSY  2000   for kidney stone        Home Medications    Prior to Admission medications   Medication Sig Start Date End Date Taking? Authorizing Provider  albuterol (PROVENTIL) (2.5 MG/3ML) 0.083% nebulizer solution Take 3 mLs (2.5 mg total) by nebulization every 6 (six) hours as needed for wheezing or shortness of breath. 06/12/18  Yes Delsa Grana, PA-C  aspirin (ASPIRIN 81) 81 MG EC tablet Take 81 mg by mouth daily. Swallow whole.   Yes [provider]  atorvastatin (LIPITOR) 40 MG tablet Take 40 mg by mouth daily.   Yes [provider]  Cholecalciferol (VITAMIN D3) 125 MCG (5000 UT) TABS Take by mouth.   Yes [provider]  Coenzyme Q10 (CO Q-10) 100 MG CAPS Take 1 capsule by mouth daily.   Yes [provider]  dextromethorphan-guaiFENesin (MUCINEX DM) 30-600 MG 12hr tablet Take 1 tablet by mouth at bedtime as needed for cough.   Yes [provider]  FIBER COMPLETE PO Take 1 tablet by mouth daily.   Yes  [provider]  glucosamine-chondroitin 500-400 MG tablet Take 1 tablet by mouth 3 (three) times daily.   Yes [provider]  hydrochlorothiazide (HYDRODIURIL) 25 MG tablet Take 1 tablet (25 mg total) by mouth daily. 11/05/18  Yes [name], MD  losartan (COZAAR) 100 MG tablet Take 1 tablet (100 mg total) by mouth daily. 11/05/18  Yes [name], MD  metoprolol succinate (TOPROL-XL) 25 MG 24 hr tablet TAKE 1 TABLET BY MOUTH EVERY DAY Patient taking differently: Take 25 mg by mouth daily.  01/30/19  Yes [name], MD  montelukast (SINGULAIR) 10 MG tablet TAKE 1 TABLET BY MOUTH EVERY  DAY Patient taking differently: Take 10 mg by mouth daily.  08/21/19  Yes [name], MD  pantoprazole (PROTONIX) 40 MG tablet TAKE 1 TABLET BY MOUTH TWICE A DAY Patient taking differently: Take 40 mg by mouth daily.  07/25/19  Yes [name], MD  PROAIR HFA 108 [phone] Base) MCG/ACT inhaler TAKE 2 PUFFS BY MOUTH EVERY 6 HOURS AS NEEDED FOR WHEEZE OR SHORTNESS OF BREATH Patient taking differently: Inhale 2 puffs into the lungs every 6 (six) hours as needed for wheezing or shortness of breath.  05/14/19  Yes Pickard, [name], MD  SYMBICORT 160-4.5 MCG/ACT inhaler INHALE 2 PUFFS INTO THE LUNGS 2 TIMES DAILY. Patient taking differently: Inhale 2 puffs into the lungs 2 (two) times daily.  08/09/19  Yes [name], MD  therapeutic multivitamin-minerals [facility]) tablet Take 1 tablet by mouth daily.   Yes [provider]  UNABLE TO FIND Med Name: clear lungs otc 4 x daily   Yes [provider]  apixaban (ELIQUIS) 5 MG TABS tablet Take 1 tablet (5 mg total) by mouth 2 (two) times daily. 04/16/19   [name], MD  budesonide-formoterol (SYMBICORT) 80-4.5 MCG/ACT inhaler Inhale 2 puffs into the lungs 2 (two) times daily. 06/27/19   Tanda Rockers, MD  budesonide-formoterol (SYMBICORT) 80-4.5 MCG/ACT inhaler Inhale 2 puffs into the lungs 2 (two) times daily. Patient not taking: Reported on 09/13/2019 06/27/19   Tanda Rockers, MD  Respiratory Therapy Supplies (FLUTTER) DEVI Use as directed 07/05/18   Tanda Rockers, MD    Family History Family History  Problem Relation Age of Onset   Rheumatic fever Mother    Stroke Father    Atrial fibrillation Father    Colon polyps Father    Arthritis Sister    Colon cancer Neg Hx    Esophageal cancer Neg Hx    Rectal cancer Neg Hx    Stomach cancer Neg Hx     Social History Social History   Tobacco Use   Smoking status: Former Smoker    Packs/day: 2.00    Years: 11.00    Pack years: 22.00    Types:  Cigarettes    Quit date: 12/19/1974    Years since quitting: 44.7   Smokeless tobacco: Never Used  Substance Use Topics   Alcohol use: Yes    Alcohol/week: 1.0 standard drinks    Types: 1 Glasses of wine per week   Drug use: No     Allergies   Patient has no known allergies.   Review of Systems Review of Systems  Cardiovascular: Positive for chest pain.  All other systems reviewed and are negative.    Physical Exam Updated Vital Signs BP 140/73    Pulse 62    Temp 98 F (36.7 C) (Oral)    Resp 15  SpO2 98%   Physical Exam Vitals signs and nursing note reviewed.  HENT:     Head: Normocephalic.  Eyes:     Extraocular Movements: Extraocular movements intact.     Pupils: Pupils are equal, round, and reactive to light.  Neck:     Musculoskeletal: Normal range of motion and neck supple.  Cardiovascular:     Rate and Rhythm: Normal rate and regular rhythm.     Heart sounds: Normal heart sounds.  Pulmonary:     Effort: Pulmonary effort is normal.     Breath sounds: Normal breath sounds.  Abdominal:     General: Bowel sounds are normal.     Palpations: Abdomen is soft.     Comments: Mild epigastric and RUQ tenderness   Musculoskeletal: Normal range of motion.  Skin:    General: Skin is warm.     Capillary Refill: Capillary refill takes less than 2 seconds.  Neurological:     General: No focal deficit present.     Mental Status: He is alert and oriented to person, place, and time.      ED Treatments / Results  Labs (all labs ordered are listed, but only abnormal results are displayed) Labs Reviewed  BASIC METABOLIC PANEL - Abnormal; Notable for the following components:      Result Value   Sodium 132 (*)    Chloride 96 (*)    Glucose, Bld 114 (*)    All other components within normal limits  CBC - Abnormal; Notable for the following components:   WBC 13.2 (*)    All other components within normal limits  HEPATIC FUNCTION PANEL - Abnormal; Notable for  the following components:   Total Bilirubin 1.6 (*)    Bilirubin, Direct 0.3 (*)    Indirect Bilirubin 1.3 (*)    All other components within normal limits  SARS CORONAVIRUS 2 (HOSPITAL ORDER, PERFORMED IN [facility] LAB)  LIPASE, BLOOD  TROPONIN I (HIGH SENSITIVITY)  TROPONIN I (HIGH SENSITIVITY)    EKG EKG Interpretation  Date/Time:  Friday September 13 2019 12:35:59 EDT Ventricular Rate:  53 PR Interval:    QRS Duration: 91 QT Interval:  426 QTC Calculation: 400 R Axis:   -32 Text Interpretation:  Sinus rhythm Left axis deviation Abnormal R-wave progression, early transition Minimal ST elevation, anterior leads Baseline wander in lead(s) V4 nonspecific changes since previous  Confirmed by Wandra Arthurs [phone]) on 09/13/2019 4:52:37 PM   Radiology Dg Chest 2 View  Result Date: 09/13/2019 CLINICAL DATA:  Chest pain EXAM: CHEST - 2 VIEW COMPARISON:  06/13/2018 FINDINGS: The heart size and mediastinal contours are stable. Chronic scarring and bronchiectasis within the bilateral lung bases, not significantly changed from prior. No new focal airspace consolidation. No pleural effusion. No pneumothorax. The visualized skeletal structures are unremarkable. IMPRESSION: Chronic bibasilar scarring and bronchiectasis. No new/acute cardiopulmonary findings. Electronically Signed   By: Davina Poke M.D.   On: 09/13/2019 13:55   US Abdomen Limited Ruq  Result Date: 09/13/2019 CLINICAL DATA:  Right upper quadrant pain. EXAM: ULTRASOUND ABDOMEN LIMITED RIGHT UPPER QUADRANT COMPARISON:  None. FINDINGS: Gallbladder: The gallbladder wall measures up to 1 cm in thickness. Multiple stones are seen in the gallbladder. A positive Murphy's sign is reported. The results sheet was not marked regarding a Murphy's sign but I did speak to the sonographer. No pericholecystic fluid. Common bile duct: Diameter: 5 mm Liver: Increased echogenicity in the liver with no focal mass identified. Portal vein is  patent on color Doppler imaging with normal direction of blood flow towards the liver. Other: None. IMPRESSION: 1. Gallbladder wall thickening, stones, and a positive Murphy's sign are most consistent with acute cholecystitis in the appropriate clinical setting. 2. Probable hepatic steatosis. Electronically Signed   By: Dorise Bullion III M.D   On: 09/13/2019 18:25    Procedures Procedures (including critical care time)  Medications Ordered in ED Medications  losartan (COZAAR) tablet 100 mg (100 mg Oral Given 09/13/19 1842)  hydrochlorothiazide (MICROZIDE) capsule 25 mg (25 mg Oral Given 09/13/19 1719)  alum & mag hydroxide-simeth (MAALOX/MYLANTA) 200-200-20 MG/5ML suspension 30 mL (30 mLs Oral Given 09/13/19 1719)    And  lidocaine (XYLOCAINE) 2 % viscous mouth solution 15 mL (15 mLs Oral Given 09/13/19 1719)     Initial Impression / Assessment and Plan / ED Course  I have reviewed the triage vital signs and the nursing notes.  Pertinent labs & imaging results that were available during my care of the patient were reviewed by me and considered in my medical decision making (see chart for details).       KEVEN PYLES is a 73 y.o. male here with epigastric pain, RUQ pain, chest pain. Likely biliary colic vs indigestion vs ACS. Had recent myoview that was low risk. Will get labs, trop x 2, LFTs, RUQ [place].   7:45 PM Labs showed bili 1.6. Nl LFTs otherwise. Trop neg x 2. RUQ US showed acute chole. I talked to Dr. Johney Maine from surgery. He states that patient has multiple medical problems and is on eliquis so recommend medicine admission and he will consult   Final Clinical Impressions(s) / ED Diagnoses   Final diagnoses:  RUQ pain    ED Discharge Orders    None       Drenda Freeze, MD 09/13/19 1946

## 2019-09-13 NOTE — ED Notes (Signed)
US at bedside

## 2019-09-13 NOTE — H&P (View-Only) (Signed)
Mario [name]  September 16, 1946 [id number]  CARE TEAM:  PCP: [name], MD  Outpatient Care Team: Patient Care Team: [name], MD as PCP - General (Family Medicine) Tanda Rockers, MD as Consulting Physician (Pulmonary Disease) [name], MD as Consulting Physician (Cardiology)  Inpatient Treatment Team: Treatment Team: Attending Provider: Drenda Freeze, MD; Registered Nurse: Sander Nephew, RN; Technician: Laurice Record, NT; Consulting Physician: Edison Pace, Md, MD   This patient is a 73 y.o.male who presents today for surgical evaluation at the request of [name], [facility] ED.   Chief complaint / Reason for evaluation: Abdominal pain with acute cholecystitis  Overweight male with history of COPD status chronic asthma, atrial fibrillation/flutter on chronic anticoagulation, hypertension, obstructive sleep apnea.  He has a known hiatal hernia which gives reflux and exacerbates his asthma.  That seems to have been better controlled on Protonix.  Followed by pulmonary and gastroenterology.  Has had intermittent abdominal pains.  Went to the emergency room Christmas Eve 2013.  Suspicious for symptomatic gallstones.  Return to the emergency room with another attack.  Sent to our group for surgical consultation cholecystectomy offered in 2014 by Dr. Madilyn Hook with our group.  He decided not to have surgery.    He had an episode of epigastric pain radiating to his chest after having salmon for dinner last night.  Felt nauseated and vomited this morning as well.  He wondered if he was having heartburn or reflux since he knows he has a hiatal hernia.   Pain persisted.  He comes Friday afternoon with an attack that would not go away.  Negative cardiac work-up.  Mildly elevated bilirubin.  Ultrasound done this evening suspicious for cholecystitis.  Surgical consultation requested.  Currently he was having some chest complaints and underwent a nuclear medicine study in  May that showed a ejection fraction of 54%.  Low risk study according to Dr. Gwenlyn Found his cardiologist.   Assessment  Mario [name]  73 y.o. male       Problem List:  Principal Problem:   Acute calculous cholecystitis Active Problems:   Obstructive sleep apnea   COPD (chronic obstructive pulmonary disease) with chronic bronchitis (HCC)   Paroxysmal atrial fibrillation (HCC)   Chronic anticoagulation   Essential hypertension   Hyperlipidemia   Asthma, chronic, moderate persistent vs ACOS in former smoker    Coronary artery calcification seen on CAT scan   Hiatal hernia   Personal history of colonic polyps   Diverticulosis of sigmoid colon   Probable acute cholecystitis in a patient with recurrent episodes of biliary colic and known symptomatic cholecystitis since 2014  Plan:  Medicine admission and clearance/stabilization.  That will not be too much of an issue since he had a low risk nuclear medicine study 4 months ago and his COPD and sleep apnea issues seem stable by prior notes.  Dr. Si Raider with medicine service noted that the patient was leaning towards going home  Antibiotics.  I would do Zosyn in this medically fragile patient who will need surgery delayed until he can come off anticoagulation  Cholecystectomy this admission once he is off Eliquis anticoagulation greater than 48 hours.  Ideally Sunday morning, 9/27.  Laparoscopic.  Cholangiogram with possible given moderately elevated as well.  He seemed to try negotiate admission and surgery recommendations.  See below.  The anatomy & physiology of hepatobiliary & pancreatic function was discussed.  The pathophysiology of gallbladder dysfunction was  discussed.  Natural history risks without surgery was discussed.   I feel the risks of no intervention will lead to serious problems that outweigh the operative risks; therefore, I recommended cholecystectomy to remove the pathology.  I explained laparoscopic techniques with  possible need for an open approach.  Probable cholangiogram to evaluate the bilary tract was explained as well.    Risks such as bleeding, infection, abscess, leak, injury to other organs, need for repair of tissues / organs, need for further treatment, stroke, heart attack, death, and other risks were discussed.  I noted a good likelihood this will help address the problem.  Possibility that this will not correct all abdominal symptoms was explained.  Goals of post-operative recovery were discussed as well.  We will work to minimize complications.  An educational handout further explaining the pathology and treatment options was given as well.  Questions were answered.    The patient wished to negotiate going home and then coming back for elective surgery on Monday.  I told him that was not a good option as he has evidence of cholecystitis.  My other concern is that he has postponed offers of surgery in the past and will do it again.  This is not the first time he has come in with a gallbladder attack.   I worry that he will get more sick which will straighten his heart and lungs.  He will fall into atrial flutter/fibrillation and complicate things further.  The safest option is to admit him, place on IV antibiotics, make sure he is medically stable comes off his anticoagulation and then do cholecystectomy when it is safe.  He needs to be off his Eliquis for 48 hours.  Therefore tentatively plan on Sunday.  He was hoping to have it done first thing Sunday morning.  I noted we would try it but cannot make absolute guarantees on specific times.  Hopefully it will go relatively well he can go home postop day 1, but I again stressed there are no guarantees.  He was hoping he could eat tonight.  Cautioned against that him start with clear liquids only.  He seemed to not like to be inconvenienced at this time, talking about needing to reach out to work and get his computer, and was trying to think of way out of being  admitted right now.  I think eventually he relented will take my medical advice.  We will see   We will follow in consultation.  -VTE prophylaxis- SCDs, etc -mobilize as tolerated to help recovery  45 minutes spent in review, evaluation, examination, counseling, and coordination of care.  More than 50% of that time was spent in counseling.  Adin Hector, MD, FACS, MASCRS Gastrointestinal and Minimally Invasive Surgery    [address] [place], [place] [address] [phone] Main / Paging [phone] Fax   09/13/2019      Past Medical History:  Diagnosis Date   Asthma    Biliary colic 123456   COPD (chronic obstructive pulmonary disease) with chronic bronchitis (HCC)    Coronary artery calcification seen on CAT scan    Cyst of pancreas    1 cm (7/18), repeat ct in 12 months.     Hyperlipidemia    Hypertension    Obstructive sleep apnea    Renal disorder    Rhinitis    Sleep apnea    pt denies sleep apnea-lost weight    Past Surgical History:  Procedure Laterality  Date   COLONOSCOPY  09/29/2014   LITHOTRIPSY  2000   for kidney stone    Social History   Socioeconomic History   Marital status: Married    Spouse name: Not on file   Number of children: Not on file   Years of education: Not on file   Highest education level: Not on file  Occupational History   Not on file  Social Needs   Financial resource strain: Not on file   Food insecurity    Worry: Not on file    Inability: Not on file   Transportation needs    Medical: Not on file    Non-medical: Not on file  Tobacco Use   Smoking status: Former Smoker    Packs/day: 2.00    Years: 11.00    Pack years: 22.00    Types: Cigarettes    Quit date: 12/19/1974    Years since quitting: 44.7   Smokeless tobacco: Never Used  Substance and Sexual Activity   Alcohol use: Yes    Alcohol/week: 1.0 standard drinks    Types: 1 Glasses of wine per week   Drug  use: No   Sexual activity: Not on file  Lifestyle   Physical activity    Days per week: Not on file    Minutes per session: Not on file   Stress: Not on file  Relationships   Social connections    Talks on phone: Not on file    Gets together: Not on file    Attends religious service: Not on file    Active member of club or organization: Not on file    Attends meetings of clubs or organizations: Not on file    Relationship status: Not on file   Intimate partner violence    Fear of current or ex partner: Not on file    Emotionally abused: Not on file    Physically abused: Not on file    Forced sexual activity: Not on file  Other Topics Concern   Not on file  Social History Narrative   Not on file    Family History  Problem Relation Age of Onset   Rheumatic fever Mother    Stroke Father    Atrial fibrillation Father    Colon polyps Father    Arthritis Sister    Colon cancer Neg Hx    Esophageal cancer Neg Hx    Rectal cancer Neg Hx    Stomach cancer Neg Hx     No current facility-administered medications for this encounter.    Current Outpatient Medications  Medication Sig Dispense Refill   albuterol (PROVENTIL) (2.5 MG/3ML) 0.083% nebulizer solution Take 3 mLs (2.5 mg total) by nebulization every 6 (six) hours as needed for wheezing or shortness of breath. 75 mL 12   aspirin (ASPIRIN 81) 81 MG EC tablet Take 81 mg by mouth daily. Swallow whole.     atorvastatin (LIPITOR) 40 MG tablet Take 40 mg by mouth daily.     Cholecalciferol (VITAMIN D3) 125 MCG (5000 UT) TABS Take by mouth.     Coenzyme Q10 (CO Q-10) 100 MG CAPS Take 1 capsule by mouth daily.     dextromethorphan-guaiFENesin (MUCINEX DM) 30-600 MG 12hr tablet Take 1 tablet by mouth at bedtime as needed for cough.     FIBER COMPLETE PO Take 1 tablet by mouth daily.     glucosamine-chondroitin 500-400 MG tablet Take 1 tablet by mouth 3 (three) times daily.  hydrochlorothiazide  (HYDRODIURIL) 25 MG tablet Take 1 tablet (25 mg total) by mouth daily. 90 tablet 3   losartan (COZAAR) 100 MG tablet Take 1 tablet (100 mg total) by mouth daily. 90 tablet 3   metoprolol succinate (TOPROL-XL) 25 MG 24 hr tablet TAKE 1 TABLET BY MOUTH EVERY DAY (Patient taking differently: Take 25 mg by mouth daily. ) 90 tablet 3   montelukast (SINGULAIR) 10 MG tablet TAKE 1 TABLET BY MOUTH EVERY DAY (Patient taking differently: Take 10 mg by mouth daily. ) 90 tablet 1   pantoprazole (PROTONIX) 40 MG tablet TAKE 1 TABLET BY MOUTH TWICE A DAY (Patient taking differently: Take 40 mg by mouth daily. ) 180 tablet 1   PROAIR HFA 108 (90 Base) MCG/ACT inhaler TAKE 2 PUFFS BY MOUTH EVERY 6 HOURS AS NEEDED FOR WHEEZE OR SHORTNESS OF BREATH (Patient taking differently: Inhale 2 puffs into the lungs every 6 (six) hours as needed for wheezing or shortness of breath. ) 8.5 Inhaler 2   SYMBICORT 160-4.5 MCG/ACT inhaler INHALE 2 PUFFS INTO THE LUNGS 2 TIMES DAILY. (Patient taking differently: Inhale 2 puffs into the lungs 2 (two) times daily. ) 30.6 Inhaler 4   therapeutic multivitamin-minerals (THERAGRAN-M) tablet Take 1 tablet by mouth daily.     UNABLE TO FIND Med Name: clear lungs otc 4 x daily     apixaban (ELIQUIS) 5 MG TABS tablet Take 1 tablet (5 mg total) by mouth 2 (two) times daily. 180 tablet 1   budesonide-formoterol (SYMBICORT) 80-4.5 MCG/ACT inhaler Inhale 2 puffs into the lungs 2 (two) times daily. 1 Inhaler 11   budesonide-formoterol (SYMBICORT) 80-4.5 MCG/ACT inhaler Inhale 2 puffs into the lungs 2 (two) times daily. (Patient not taking: Reported on 09/13/2019) 1 Inhaler 0   Respiratory Therapy Supplies (FLUTTER) DEVI Use as directed 1 each 0     No Known Allergies  ROS:   All other systems reviewed & are negative except per HPI or as noted below: Constitutional:  No fevers, chills, sweats.  Weight stable Eyes:  No vision changes, No discharge HENT:  No sore throats, nasal  drainage Lymph: No neck swelling, No bruising easily Pulmonary:  No cough, productive sputum CV: No orthopnea, PND  Patient walks 20 minutes without difficulty.  No exertional chest/neck/shoulder/arm pain. GI: No personal nor family history of GI/colon cancer, inflammatory bowel disease, irritable bowel syndrome, allergy such as Celiac Sprue, dietary/dairy problems, colitis, ulcers nor gastritis.  No recent sick contacts/gastroenteritis.  No travel outside the country.  No changes in diet.  Does have heartburn and reflux improved on proton pump inhibitor.  No dysphasia to solids or liquids.  No hematemesis. Renal: No UTIs, No hematuria Genital:  No drainage, bleeding, masses Musculoskeletal: No severe joint pain.  Good ROM major joints Skin:  No sores or lesions.  No rashes Heme/Lymph:  No easy bleeding.  No swollen lymph nodes Neuro: No focal weakness/numbness.  No seizures Psych: No suicidal ideation.  No hallucinations  BP 140/73    Pulse 62    Temp 98 F (36.7 C) (Oral)    Resp 15    SpO2 98%   Physical Exam: General: Pt awake/alert/oriented x4 in no major acute distress Eyes: PERRL, normal EOM. Sclera nonicteric Neuro: CN II-XII intact w/o focal sensory/motor deficits. Lymph: No head/neck/groin lymphadenopathy Psych:  No delerium/psychosis/paranoia HENT: Normocephalic, Mucus membranes moist.  No thrush Neck: Supple, No tracheal deviation Chest: No pain.  Good respiratory excursion. CV:  Pulses intact.  Regular rhythm Abdomen:  Obese but soft, Nondistended.  Discomfort in epigastric region.  He had Murphy sign at the time of ultrasound.  Seems less sensitive now.  Lower abdomen and left upper quadrant are nontender.  No incarcerated hernias. Gen:  No inguinal hernias.  No inguinal lymphadenopathy.   Ext:  SCDs BLE.  No significant edema.  No cyanosis Skin: No petechiae / purpurea.  No major sores Musculoskeletal: No severe joint pain.  Good ROM major joints   Results:    Labs: Results for orders placed or performed during the hospital encounter of 09/13/19 (from the past 48 hour(s))  Basic metabolic panel     Status: Abnormal   Collection Time: 09/13/19  1:18 PM  Result Value Ref Range   Sodium 132 (L) 135 - 145 mmol/L   Potassium 4.0 3.5 - 5.1 mmol/L   Chloride 96 (L) 98 - 111 mmol/L   CO2 27 22 - 32 mmol/L   Glucose, Bld 114 (H) 70 - 99 mg/dL   BUN 23 8 - 23 mg/dL   Creatinine, Ser 0.82 0.61 - 1.24 mg/dL   Calcium 9.1 8.9 - 10.3 mg/dL   GFR calc non Af Amer >60 >60 mL/min   GFR calc Af Amer >60 >60 mL/min   Anion gap 9 5 - 15    Comment: Performed at [facility], [place] [address], [place], [place] [address]  CBC     Status: Abnormal   Collection Time: 09/13/19  1:18 PM  Result Value Ref Range   WBC 13.2 (H) 4.0 - 10.5 K/uL   RBC 4.90 4.22 - 5.81 MIL/uL   Hemoglobin 15.3 13.0 - 17.0 g/dL   HCT 46.2 39.0 - 52.0 %   MCV 94.3 80.0 - 100.0 fL   MCH 31.2 26.0 - 34.0 pg   MCHC 33.1 30.0 - 36.0 g/dL   RDW 14.4 11.5 - 15.5 %   Platelets 256 150 - 400 K/uL   nRBC 0.0 0.0 - 0.2 %    Comment: Performed at [facility], [place] [address] [place], [place] [address]  Troponin I (High Sensitivity)     Status: None   Collection Time: 09/13/19  1:18 PM  Result Value Ref Range   Troponin I (High Sensitivity) 5 <18 ng/L    Comment: (NOTE) Elevated high sensitivity troponin I (hsTnI) values and significant  changes across serial measurements may suggest ACS but many other  chronic and acute conditions are known to elevate hsTnI results.  Refer to the "Links" section for chest pain algorithms and additional  guidance. Performed at [facility], [place] [address] [place], [place] [address]   Troponin I (High Sensitivity)     Status: None   Collection Time: 09/13/19  5:30 PM  Result Value Ref Range   Troponin I (High Sensitivity) 5 <18 ng/L    Comment: (NOTE) Elevated high sensitivity troponin I  (hsTnI) values and significant  changes across serial measurements may suggest ACS but many other  chronic and acute conditions are known to elevate hsTnI results.  Refer to the "Links" section for chest pain algorithms and additional  guidance. Performed at [facility], [place] [address], [place], [place] [address]   Hepatic function panel     Status: Abnormal   Collection Time: 09/13/19  5:30 PM  Result Value Ref Range   Total Protein 6.7 6.5 - 8.1 g/dL   Albumin 3.8 3.5 - 5.0 g/dL   AST 23 15 - 41 U/L  ALT 27 0 - 44 U/L   Alkaline Phosphatase 45 38 - 126 U/L   Total Bilirubin 1.6 (H) 0.3 - 1.2 mg/dL   Bilirubin, Direct 0.3 (H) 0.0 - 0.2 mg/dL   Indirect Bilirubin 1.3 (H) 0.3 - 0.9 mg/dL    Comment: Performed at [facility], [place] [address], [place], [place] [address]  Lipase, blood     Status: None   Collection Time: 09/13/19  5:30 PM  Result Value Ref Range   Lipase 36 11 - 51 U/L    Comment: Performed at [facility], [place] [address], [place], [place] [address]    Imaging / Studies: Dg Chest 2 View  Result Date: 09/13/2019 CLINICAL DATA:  Chest pain EXAM: CHEST - 2 VIEW COMPARISON:  06/13/2018 FINDINGS: The heart size and mediastinal contours are stable. Chronic scarring and bronchiectasis within the bilateral lung bases, not significantly changed from prior. No new focal airspace consolidation. No pleural effusion. No pneumothorax. The visualized skeletal structures are unremarkable. IMPRESSION: Chronic bibasilar scarring and bronchiectasis. No new/acute cardiopulmonary findings. Electronically Signed   By: Davina Poke M.D.   On: 09/13/2019 13:55   US Abdomen Limited Ruq  Result Date: 09/13/2019 CLINICAL DATA:  Right upper quadrant pain. EXAM: ULTRASOUND ABDOMEN LIMITED RIGHT UPPER QUADRANT COMPARISON:  None. FINDINGS: Gallbladder: The gallbladder wall measures up to 1 cm in thickness. Multiple stones are seen in the  gallbladder. A positive Murphy's sign is reported. The results sheet was not marked regarding a Murphy's sign but I did speak to the sonographer. No pericholecystic fluid. Common bile duct: Diameter: 5 mm Liver: Increased echogenicity in the liver with no focal mass identified. Portal vein is patent on color Doppler imaging with normal direction of blood flow towards the liver. Other: None. IMPRESSION: 1. Gallbladder wall thickening, stones, and a positive Murphy's sign are most consistent with acute cholecystitis in the appropriate clinical setting. 2. Probable hepatic steatosis. Electronically Signed   By: Dorise Bullion III M.D   On: 09/13/2019 18:25    Medications / Allergies: per chart  Antibiotics: Anti-infectives (From admission, onward)   None        Note: Portions of this report may have been transcribed using voice recognition software. Every effort was made to ensure accuracy; however, inadvertent computerized transcription errors may be present.   Any transcriptional errors that result from this process are unintentional.    Adin Hector, MD, FACS, MASCRS Gastrointestinal and Minimally Invasive Surgery    [address] [place], [place] [address] [phone] Main / Paging [phone] Fax   09/13/2019

## 2019-09-13 NOTE — ED Notes (Signed)
ED TO INPATIENT HANDOFF REPORT  Name/Age/Gender Mario [name] 73 y.o. male  Code Status Code Status History    Date Active Date Inactive Code Status Order ID Comments User Context   12/12/2012 0301 12/12/2012 1949 Full Code [id number]  Morrison Old, RN Inpatient   Advance Care Planning Activity      Home/SNF/Other Home  Chief Complaint chest pain  Level of Care/Admitting Diagnosis ED Disposition    ED Disposition Condition [facility] Area: [facility] [[id number]]  Level of Care: Med-Surg [16]  Covid Evaluation: Asymptomatic Screening Protocol (No Symptoms)  Diagnosis: Cholecystitis [id number]  Admitting Physician: Eston Esters  Attending Physician: Eston Esters  Estimated length of stay: past midnight tomorrow  Certification:: I certify this patient will need inpatient services for at least 2 midnights  PT Class (Do Not Modify): Inpatient [101]  PT Acc Code (Do Not Modify): Private [1]       Medical History Past Medical History:  Diagnosis Date  . Asthma   . Biliary colic [id number]  . COPD (chronic obstructive pulmonary disease) with chronic bronchitis ([place])   . Coronary artery calcification seen on CAT scan   . Cyst of pancreas    1 cm (7/18), repeat ct in 12 months.    . Hyperlipidemia   . Hypertension   . Obstructive sleep apnea   . Renal disorder   . Rhinitis   . Sleep apnea    pt denies sleep apnea-lost weight    Allergies No Known Allergies  IV Location/Drains/Wounds Patient Lines/Drains/Airways Status   Active Line/Drains/Airways    Name:   Placement date:   Placement time:   Site:   Days:   Peripheral IV 09/13/19 Right;Posterior;Distal Arm   09/13/19    2052    Arm   less than 1          Labs/Imaging Results for orders placed or performed during the hospital encounter of 09/13/19 (from the past 48 hour(s))  Basic metabolic panel     Status: Abnormal   Collection Time: 09/13/19   1:18 PM  Result Value Ref Range   Sodium 132 (L) 135 - 145 mmol/L   Potassium 4.0 3.5 - 5.1 mmol/L   Chloride 96 (L) 98 - 111 mmol/L   CO2 27 22 - 32 mmol/L   Glucose, Bld 114 (H) 70 - 99 mg/dL   BUN 23 8 - 23 mg/dL   Creatinine, Ser 0.82 0.61 - 1.24 mg/dL   Calcium 9.1 8.9 - 10.3 mg/dL   GFR calc non Af Amer >60 >60 mL/min   GFR calc Af Amer >60 >60 mL/min   Anion gap 9 5 - 15    Comment: Performed at [facility], [place] [address] [place], [place] [address]  CBC     Status: Abnormal   Collection Time: 09/13/19  1:18 PM  Result Value Ref Range   WBC 13.2 (H) 4.0 - 10.5 K/uL   RBC 4.90 4.22 - 5.81 MIL/uL   Hemoglobin 15.3 13.0 - 17.0 g/dL   HCT 46.2 39.0 - 52.0 %   MCV 94.3 80.0 - 100.0 fL   MCH 31.2 26.0 - 34.0 pg   MCHC 33.1 30.0 - 36.0 g/dL   RDW 14.4 11.5 - 15.5 %   Platelets 256 150 - 400 K/uL   nRBC 0.0 0.0 - 0.2 %    Comment: Performed at [facility], [place] [address] [place],  [place] [address]  Troponin I (High Sensitivity)     Status: None   Collection Time: 09/13/19  1:18 PM  Result Value Ref Range   Troponin I (High Sensitivity) 5 <18 ng/L    Comment: (NOTE) Elevated high sensitivity troponin I (hsTnI) values and significant  changes across serial measurements may suggest ACS but many other  chronic and acute conditions are known to elevate hsTnI results.  Refer to the "Links" section for chest pain algorithms and additional  guidance. Performed at [facility], [place] [address] [place], [place] [address]   Troponin I (High Sensitivity)     Status: None   Collection Time: 09/13/19  5:30 PM  Result Value Ref Range   Troponin I (High Sensitivity) 5 <18 ng/L    Comment: (NOTE) Elevated high sensitivity troponin I (hsTnI) values and significant  changes across serial measurements may suggest ACS but many other  chronic and acute conditions are known to elevate hsTnI results.  Refer to the "Links" section  for chest pain algorithms and additional  guidance. Performed at [facility], [place] [address], [place], [place] [address]   Hepatic function panel     Status: Abnormal   Collection Time: 09/13/19  5:30 PM  Result Value Ref Range   Total Protein 6.7 6.5 - 8.1 g/dL   Albumin 3.8 3.5 - 5.0 g/dL   AST 23 15 - 41 U/L   ALT 27 0 - 44 U/L   Alkaline Phosphatase 45 38 - 126 U/L   Total Bilirubin 1.6 (H) 0.3 - 1.2 mg/dL   Bilirubin, Direct 0.3 (H) 0.0 - 0.2 mg/dL   Indirect Bilirubin 1.3 (H) 0.3 - 0.9 mg/dL    Comment: Performed at [facility], [place] [address], [place], [place] [address]  Lipase, blood     Status: None   Collection Time: 09/13/19  5:30 PM  Result Value Ref Range   Lipase 36 11 - 51 U/L    Comment: Performed at [facility], [place] [address], [place], [place] [address]  SARS Coronavirus 2 [facility] order, Performed in [facility] hospital lab) Nasopharyngeal Nasopharyngeal Swab     Status: None   Collection Time: 09/13/19  8:14 PM   Specimen: Nasopharyngeal Swab  Result Value Ref Range   SARS Coronavirus 2 NEGATIVE NEGATIVE    Comment: (NOTE) If result is NEGATIVE SARS-CoV-2 target nucleic acids are NOT DETECTED. The SARS-CoV-2 RNA is generally detectable in upper and lower  respiratory specimens during the acute phase of infection. The lowest  concentration of SARS-CoV-2 viral copies this assay can detect is 250  copies / mL. A negative result does not preclude SARS-CoV-2 infection  and should not be used as the sole basis for treatment or other  patient management decisions.  A negative result may occur with  improper specimen collection / handling, submission of specimen other  than nasopharyngeal swab, presence of viral mutation(s) within the  areas targeted by this assay, and inadequate number of viral copies  (<250 copies / mL). A negative result must be combined with clinical  observations, patient history,  and epidemiological information. If result is POSITIVE SARS-CoV-2 target nucleic acids are DETECTED. The SARS-CoV-2 RNA is generally detectable in upper and lower  respiratory specimens dur ing the acute phase of infection.  Positive  results are indicative of active infection with SARS-CoV-2.  Clinical  correlation with patient history and other diagnostic information is  necessary to determine patient infection  status.  Positive results do  not rule out bacterial infection or co-infection with other viruses. If result is PRESUMPTIVE POSTIVE SARS-CoV-2 nucleic acids MAY BE PRESENT.   A presumptive positive result was obtained on the submitted specimen  and confirmed on repeat testing.  While 2019 novel coronavirus  (SARS-CoV-2) nucleic acids may be present in the submitted sample  additional confirmatory testing may be necessary for epidemiological  and / or clinical management purposes  to differentiate between  SARS-CoV-2 and other Sarbecovirus currently known to infect humans.  If clinically indicated additional testing with an alternate test  methodology [phone]) is advised. The SARS-CoV-2 RNA is generally  detectable in upper and lower respiratory sp ecimens during the acute  phase of infection. The expected result is Negative. Fact Sheet for Patients:  StrictlyIdeas.no Fact Sheet for Healthcare Providers: BankingDealers.co.za This test is not yet approved or cleared by the [place] FDA and has been authorized for detection and/or diagnosis of SARS-CoV-2 by FDA under an Emergency Use Authorization (EUA).  This EUA will remain in effect (meaning this test can be used) for the duration of the COVID-19 declaration under Section 564(b)(1) of the Act, 21 U.S.C. section 360bbb-3(b)(1), unless the authorization is terminated or revoked sooner. Performed at [facility], [place] [address], [place], [place]  [address]    Dg Chest 2 View  Result Date: 09/13/2019 CLINICAL DATA:  Chest pain EXAM: CHEST - 2 VIEW COMPARISON:  06/13/2018 FINDINGS: The heart size and mediastinal contours are stable. Chronic scarring and bronchiectasis within the bilateral lung bases, not significantly changed from prior. No new focal airspace consolidation. No pleural effusion. No pneumothorax. The visualized skeletal structures are unremarkable. IMPRESSION: Chronic bibasilar scarring and bronchiectasis. No new/acute cardiopulmonary findings. Electronically Signed   By: Davina Poke M.D.   On: 09/13/2019 13:55   US Abdomen Limited Ruq  Result Date: 09/13/2019 CLINICAL DATA:  Right upper quadrant pain. EXAM: ULTRASOUND ABDOMEN LIMITED RIGHT UPPER QUADRANT COMPARISON:  None. FINDINGS: Gallbladder: The gallbladder wall measures up to 1 cm in thickness. Multiple stones are seen in the gallbladder. A positive Murphy's sign is reported. The results sheet was not marked regarding a Murphy's sign but I did speak to the sonographer. No pericholecystic fluid. Common bile duct: Diameter: 5 mm Liver: Increased echogenicity in the liver with no focal mass identified. Portal vein is patent on color Doppler imaging with normal direction of blood flow towards the liver. Other: None. IMPRESSION: 1. Gallbladder wall thickening, stones, and a positive Murphy's sign are most consistent with acute cholecystitis in the appropriate clinical setting. 2. Probable hepatic steatosis. Electronically Signed   By: Dorise Bullion III M.D   On: 09/13/2019 18:25    Pending Labs Unresulted Labs (From admission, onward)    Start     Ordered   Signed and Held  Basic metabolic panel  Tomorrow morning,   R     Signed and Held          Vitals/Pain Today's Vitals   09/13/19 1900 09/13/19 1930 09/13/19 2009 09/13/19 2231  BP: 140/73 135/74 (!) 143/78 110/83  Pulse: 62 (!) 52 71 88  Resp: 15 18 15 15   Temp:      TempSrc:      SpO2: 98% 97% 97% 98%   PainSc:        Isolation Precautions No active isolations  Medications Medications  piperacillin-tazobactam (ZOSYN) IVPB 3.375 g (has no administration in time range)  lip balm (CARMEX) ointment 1  application (has no administration in time range)  magic mouthwash (has no administration in time range)  alum & mag hydroxide-simeth (MAALOX/MYLANTA) 200-200-20 MG/5ML suspension 30 mL (has no administration in time range)  methocarbamol (ROBAXIN) 1,000 mg in dextrose 5 % 50 mL IVPB (has no administration in time range)  methocarbamol (ROBAXIN) tablet 1,000 mg (has no administration in time range)  acetaminophen (TYLENOL) tablet 325-650 mg (has no administration in time range)  acetaminophen (TYLENOL) suppository 650 mg (has no administration in time range)  phenol (CHLORASEPTIC) mouth spray 2 spray (has no administration in time range)  menthol-cetylpyridinium (CEPACOL) lozenge 3 mg (has no administration in time range)  psyllium (HYDROCIL/METAMUCIL) packet 1 packet (has no administration in time range)  bisacodyl (DULCOLAX) suppository 10 mg (has no administration in time range)  guaiFENesin-dextromethorphan (ROBITUSSIN DM) 100-10 MG/5ML syrup 10 mL (has no administration in time range)  hydrocortisone (ANUSOL-HC) 2.5 % rectal cream 1 application (has no administration in time range)  hydrocortisone cream 1 % 1 application (has no administration in time range)  metoprolol tartrate (LOPRESSOR) injection 5 mg (has no administration in time range)  losartan (COZAAR) tablet 100 mg (100 mg Oral Given 09/13/19 1842)  hydrochlorothiazide (MICROZIDE) capsule 25 mg (25 mg Oral Given 09/13/19 1719)  alum & mag hydroxide-simeth (MAALOX/MYLANTA) 200-200-20 MG/5ML suspension 30 mL (30 mLs Oral Given 09/13/19 1719)    And  lidocaine (XYLOCAINE) 2 % viscous mouth solution 15 mL (15 mLs Oral Given 09/13/19 1719)    Mobility walks

## 2019-09-13 NOTE — ED Triage Notes (Addendum)
Patient c/o central chest and upper abdominal aching since 2300 yesterday. Reports two episodes of vomiting and diarrhea. Hx hiatal hernia and afib. Reports taking blood thinner as prescribed. Denies fever and cough.

## 2019-09-13 NOTE — Consult Note (Signed)
Mario [name]  Nov 18, 1946 [id number]  CARE TEAM:  PCP: Mario Frizzle, MD  Outpatient Care Team: Patient Care Team: Mario Frizzle, MD as PCP - General (Family Medicine) Mario Rockers, MD as Consulting Physician (Pulmonary Disease) Mario Harp, MD as Consulting Physician (Cardiology)  Inpatient Treatment Team: Treatment Team: Attending Provider: Drenda Freeze, MD; Registered Nurse: Mario Nephew, RN; Technician: Mario [name], NT; Consulting Physician: Mario Pace, Md, MD   This patient is a 73 y.o.male who presents today for surgical evaluation at the request of Mario [name], [name] ED.   Chief complaint / Reason for evaluation: Abdominal pain with acute cholecystitis  Overweight male with history of COPD status chronic asthma, atrial fibrillation/flutter on chronic anticoagulation, hypertension, obstructive sleep apnea.  He has a known hiatal hernia which gives reflux and exacerbates his asthma.  That seems to have been better controlled on Protonix.  Followed by pulmonary and gastroenterology.  Has had intermittent abdominal pains.  Went to the emergency room Christmas Eve 2013.  Suspicious for symptomatic gallstones.  Return to the emergency room with another attack.  Sent to our group for surgical consultation cholecystectomy offered in 2014 by Mario. Madilyn [name] with our group.  He decided not to have surgery.    He had an episode of epigastric pain radiating to his chest after having salmon for dinner last night.  Felt nauseated and vomited this morning as well.  He wondered if he was having heartburn or reflux since he knows he has a hiatal hernia.   Pain persisted.  He comes Friday afternoon with an attack that would not go away.  Negative cardiac work-up.  Mildly elevated bilirubin.  Ultrasound done this evening suspicious for cholecystitis.  Surgical consultation requested.  Currently he was having some chest complaints and underwent a nuclear medicine study in  May that showed a ejection fraction of 54%.  Low risk study according to Mario. Gwenlyn [name] his cardiologist.   Assessment  Mario [name]  73 y.o. male       Problem List:  Principal Problem:   Acute calculous cholecystitis Active Problems:   Obstructive sleep apnea   COPD (chronic obstructive pulmonary disease) with chronic bronchitis (HCC)   Paroxysmal atrial fibrillation (HCC)   Chronic anticoagulation   Essential hypertension   Hyperlipidemia   Asthma, chronic, moderate persistent vs ACOS in former smoker    Coronary artery calcification seen on CAT scan   Hiatal hernia   Personal history of colonic polyps   Diverticulosis of sigmoid colon   Probable acute cholecystitis in a patient with recurrent episodes of biliary colic and known symptomatic cholecystitis since 2014  Plan:  Medicine admission and clearance/stabilization.  That will not be too much of an issue since he had a low risk nuclear medicine study 4 months ago and his COPD and sleep apnea issues seem stable by prior notes.  Mario [name] with medicine service noted that the patient was leaning towards going home  Antibiotics.  I would do Zosyn in this medically fragile patient who will need surgery delayed until he can come off anticoagulation  Cholecystectomy this admission once he is off Eliquis anticoagulation greater than 48 hours.  Ideally Sunday morning, 9/27.  Laparoscopic.  Cholangiogram with possible given moderately elevated as well.  He seemed to try negotiate admission and surgery recommendations.  See below.  The anatomy & physiology of hepatobiliary & pancreatic function was discussed.  The pathophysiology of gallbladder dysfunction was  discussed.  Natural history risks without surgery was discussed.   I feel the risks of no intervention will lead to serious problems that outweigh the operative risks; therefore, I recommended cholecystectomy to remove the pathology.  I explained laparoscopic techniques with  possible need for an open approach.  Probable cholangiogram to evaluate the bilary tract was explained as well.    Risks such as bleeding, infection, abscess, leak, injury to other organs, need for repair of tissues / organs, need for further treatment, stroke, heart attack, death, and other risks were discussed.  I noted a good likelihood this will help address the problem.  Possibility that this will not correct all abdominal symptoms was explained.  Goals of post-operative recovery were discussed as well.  We will work to minimize complications.  An educational handout further explaining the pathology and treatment options was given as well.  Questions were answered.    The patient wished to negotiate going home and then coming back for elective surgery on Monday.  I told him that was not a good option as he has evidence of cholecystitis.  My other concern is that he has postponed offers of surgery in the past and will do it again.  This is not the first time he has come in with a gallbladder attack.   I worry that he will get more sick which will straighten his heart and lungs.  He will fall into atrial flutter/fibrillation and complicate things further.  The safest option is to admit him, place on IV antibiotics, make sure he is medically stable comes off his anticoagulation and then do cholecystectomy when it is safe.  He needs to be off his Eliquis for 48 hours.  Therefore tentatively plan on Sunday.  He was hoping to have it done first thing Sunday morning.  I noted we would try it but cannot make absolute guarantees on specific times.  Hopefully it will go relatively well he can go home postop day 1, but I again stressed there are no guarantees.  He was hoping he could eat tonight.  Cautioned against that him start with clear liquids only.  He seemed to not like to be inconvenienced at this time, talking about needing to reach out to work and get his computer, and was trying to think of way out of being  admitted right now.  I think eventually he relented will take my medical advice.  We will see   We will follow in consultation.  -VTE prophylaxis- SCDs, etc -mobilize as tolerated to help recovery  45 minutes spent in review, evaluation, examination, counseling, and coordination of care.  More than 50% of that time was spent in counseling.  Adin Hector, MD, FACS, MASCRS Gastrointestinal and Minimally Invasive Surgery    [address] [place], [place] [address] [phone] Main / Paging [phone] Fax   09/13/2019      Past Medical History:  Diagnosis Date   Asthma    Biliary colic 123456   COPD (chronic obstructive pulmonary disease) with chronic bronchitis (HCC)    Coronary artery calcification seen on CAT scan    Cyst of pancreas    1 cm (7/18), repeat ct in 12 months.     Hyperlipidemia    Hypertension    Obstructive sleep apnea    Renal disorder    Rhinitis    Sleep apnea    pt denies sleep apnea-lost weight    Past Surgical History:  Procedure Laterality  Date   COLONOSCOPY  09/29/2014   LITHOTRIPSY  2000   for kidney stone    Social History   Socioeconomic History   Marital status: Married    Spouse name: Not on file   Number of children: Not on file   Years of education: Not on file   Highest education level: Not on file  Occupational History   Not on file  Social Needs   Financial resource strain: Not on file   Food insecurity    Worry: Not on file    Inability: Not on file   Transportation needs    Medical: Not on file    Non-medical: Not on file  Tobacco Use   Smoking status: Former Smoker    Packs/day: 2.00    Years: 11.00    Pack years: 22.00    Types: Cigarettes    Quit date: 12/19/1974    Years since quitting: 44.7   Smokeless tobacco: Never Used  Substance and Sexual Activity   Alcohol use: Yes    Alcohol/week: 1.0 standard drinks    Types: 1 Glasses of wine per week   Drug  use: No   Sexual activity: Not on file  Lifestyle   Physical activity    Days per week: Not on file    Minutes per session: Not on file   Stress: Not on file  Relationships   Social connections    Talks on phone: Not on file    Gets together: Not on file    Attends religious service: Not on file    Active member of club or organization: Not on file    Attends meetings of clubs or organizations: Not on file    Relationship status: Not on file   Intimate partner violence    Fear of current or ex partner: Not on file    Emotionally abused: Not on file    Physically abused: Not on file    Forced sexual activity: Not on file  Other Topics Concern   Not on file  Social History Narrative   Not on file    Family History  Problem Relation Age of Onset   Rheumatic fever Mother    Stroke Father    Atrial fibrillation Father    Colon polyps Father    Arthritis Sister    Colon cancer Neg Hx    Esophageal cancer Neg Hx    Rectal cancer Neg Hx    Stomach cancer Neg Hx     No current facility-administered medications for this encounter.    Current Outpatient Medications  Medication Sig Dispense Refill   albuterol (PROVENTIL) (2.5 MG/3ML) 0.083% nebulizer solution Take 3 mLs (2.5 mg total) by nebulization every 6 (six) hours as needed for wheezing or shortness of breath. 75 mL 12   aspirin (ASPIRIN 81) 81 MG EC tablet Take 81 mg by mouth daily. Swallow whole.     atorvastatin (LIPITOR) 40 MG tablet Take 40 mg by mouth daily.     Cholecalciferol (VITAMIN D3) 125 MCG (5000 UT) TABS Take by mouth.     Coenzyme Q10 (CO Q-10) 100 MG CAPS Take 1 capsule by mouth daily.     dextromethorphan-guaiFENesin (MUCINEX DM) 30-600 MG 12hr tablet Take 1 tablet by mouth at bedtime as needed for cough.     FIBER COMPLETE PO Take 1 tablet by mouth daily.     glucosamine-chondroitin 500-400 MG tablet Take 1 tablet by mouth 3 (three) times daily.  hydrochlorothiazide  (HYDRODIURIL) 25 MG tablet Take 1 tablet (25 mg total) by mouth daily. 90 tablet 3   losartan (COZAAR) 100 MG tablet Take 1 tablet (100 mg total) by mouth daily. 90 tablet 3   metoprolol succinate (TOPROL-XL) 25 MG 24 hr tablet TAKE 1 TABLET BY MOUTH EVERY DAY (Patient taking differently: Take 25 mg by mouth daily. ) 90 tablet 3   montelukast (SINGULAIR) 10 MG tablet TAKE 1 TABLET BY MOUTH EVERY DAY (Patient taking differently: Take 10 mg by mouth daily. ) 90 tablet 1   pantoprazole (PROTONIX) 40 MG tablet TAKE 1 TABLET BY MOUTH TWICE A DAY (Patient taking differently: Take 40 mg by mouth daily. ) 180 tablet 1   PROAIR HFA 108 (90 Base) MCG/ACT inhaler TAKE 2 PUFFS BY MOUTH EVERY 6 HOURS AS NEEDED FOR WHEEZE OR SHORTNESS OF BREATH (Patient taking differently: Inhale 2 puffs into the lungs every 6 (six) hours as needed for wheezing or shortness of breath. ) 8.5 Inhaler 2   SYMBICORT 160-4.5 MCG/ACT inhaler INHALE 2 PUFFS INTO THE LUNGS 2 TIMES DAILY. (Patient taking differently: Inhale 2 puffs into the lungs 2 (two) times daily. ) 30.6 Inhaler 4   therapeutic multivitamin-minerals (THERAGRAN-M) tablet Take 1 tablet by mouth daily.     UNABLE TO FIND Med Name: clear lungs otc 4 x daily     apixaban (ELIQUIS) 5 MG TABS tablet Take 1 tablet (5 mg total) by mouth 2 (two) times daily. 180 tablet 1   budesonide-formoterol (SYMBICORT) 80-4.5 MCG/ACT inhaler Inhale 2 puffs into the lungs 2 (two) times daily. 1 Inhaler 11   budesonide-formoterol (SYMBICORT) 80-4.5 MCG/ACT inhaler Inhale 2 puffs into the lungs 2 (two) times daily. (Patient not taking: Reported on 09/13/2019) 1 Inhaler 0   Respiratory Therapy Supplies (FLUTTER) DEVI Use as directed 1 each 0     No Known Allergies  ROS:   All other systems reviewed & are negative except per HPI or as noted below: Constitutional:  No fevers, chills, sweats.  Weight stable Eyes:  No vision changes, No discharge HENT:  No sore throats, nasal  drainage Lymph: No neck swelling, No bruising easily Pulmonary:  No cough, productive sputum CV: No orthopnea, PND  Patient walks 20 minutes without difficulty.  No exertional chest/neck/shoulder/arm pain. GI: No personal nor family history of GI/colon cancer, inflammatory bowel disease, irritable bowel syndrome, allergy such as Celiac Sprue, dietary/dairy problems, colitis, ulcers nor gastritis.  No recent sick contacts/gastroenteritis.  No travel outside the country.  No changes in diet.  Does have heartburn and reflux improved on proton pump inhibitor.  No dysphasia to solids or liquids.  No hematemesis. Renal: No UTIs, No hematuria Genital:  No drainage, bleeding, masses Musculoskeletal: No severe joint pain.  Good ROM major joints Skin:  No sores or lesions.  No rashes Heme/Lymph:  No easy bleeding.  No swollen lymph nodes Neuro: No focal weakness/numbness.  No seizures Psych: No suicidal ideation.  No hallucinations  BP 140/73    Pulse 62    Temp 98 F (36.7 C) (Oral)    Resp 15    SpO2 98%   Physical Exam: General: Pt awake/alert/oriented x4 in no major acute distress Eyes: PERRL, normal EOM. Sclera nonicteric Neuro: CN II-XII intact w/o focal sensory/motor deficits. Lymph: No head/neck/groin lymphadenopathy Psych:  No delerium/psychosis/paranoia HENT: Normocephalic, Mucus membranes moist.  No thrush Neck: Supple, No tracheal deviation Chest: No pain.  Good respiratory excursion. CV:  Pulses intact.  Regular rhythm Abdomen:  Obese but soft, Nondistended.  Discomfort in epigastric region.  He had Murphy sign at the time of ultrasound.  Seems less sensitive now.  Lower abdomen and left upper quadrant are nontender.  No incarcerated hernias. Gen:  No inguinal hernias.  No inguinal lymphadenopathy.   Ext:  SCDs BLE.  No significant edema.  No cyanosis Skin: No petechiae / purpurea.  No major sores Musculoskeletal: No severe joint pain.  Good ROM major joints   Results:    Labs: Results for orders placed or performed during the hospital encounter of 09/13/19 (from the past 48 hour(s))  Basic metabolic panel     Status: Abnormal   Collection Time: 09/13/19  1:18 PM  Result Value Ref Range   Sodium 132 (L) 135 - 145 mmol/L   Potassium 4.0 3.5 - 5.1 mmol/L   Chloride 96 (L) 98 - 111 mmol/L   CO2 27 22 - 32 mmol/L   Glucose, Bld 114 (H) 70 - 99 mg/dL   BUN 23 8 - 23 mg/dL   Creatinine, Ser 0.82 0.61 - 1.24 mg/dL   Calcium 9.1 8.9 - 10.3 mg/dL   GFR calc non Af Amer >60 >60 mL/min   GFR calc Af Amer >60 >60 mL/min   Anion gap 9 5 - 15    Comment: Performed at [facility], [place] [address] [place], [place] [address]  CBC     Status: Abnormal   Collection Time: 09/13/19  1:18 PM  Result Value Ref Range   WBC 13.2 (H) 4.0 - 10.5 K/uL   RBC 4.90 4.22 - 5.81 MIL/uL   Hemoglobin 15.3 13.0 - 17.0 g/dL   HCT 46.2 39.0 - 52.0 %   MCV 94.3 80.0 - 100.0 fL   MCH 31.2 26.0 - 34.0 pg   MCHC 33.1 30.0 - 36.0 g/dL   RDW 14.4 11.5 - 15.5 %   Platelets 256 150 - 400 K/uL   nRBC 0.0 0.0 - 0.2 %    Comment: Performed at [facility], [place] [address], [place], [place] [address]  Troponin I (High Sensitivity)     Status: None   Collection Time: 09/13/19  1:18 PM  Result Value Ref Range   Troponin I (High Sensitivity) 5 <18 ng/L    Comment: (NOTE) Elevated high sensitivity troponin I (hsTnI) values and significant  changes across serial measurements may suggest ACS but many other  chronic and acute conditions are known to elevate hsTnI results.  Refer to the "Links" section for chest pain algorithms and additional  guidance. Performed at [facility], [place] [address], [place], [place] [address]   Troponin I (High Sensitivity)     Status: None   Collection Time: 09/13/19  5:30 PM  Result Value Ref Range   Troponin I (High Sensitivity) 5 <18 ng/L    Comment: (NOTE) Elevated high sensitivity troponin I  (hsTnI) values and significant  changes across serial measurements may suggest ACS but many other  chronic and acute conditions are known to elevate hsTnI results.  Refer to the "Links" section for chest pain algorithms and additional  guidance. Performed at [facility], [place] [address] [place], [place] [address]   Hepatic function panel     Status: Abnormal   Collection Time: 09/13/19  5:30 PM  Result Value Ref Range   Total Protein 6.7 6.5 - 8.1 g/dL   Albumin 3.8 3.5 - 5.0 g/dL   AST 23 15 - 41 U/L  ALT 27 0 - 44 U/L   Alkaline Phosphatase 45 38 - 126 U/L   Total Bilirubin 1.6 (H) 0.3 - 1.2 mg/dL   Bilirubin, Direct 0.3 (H) 0.0 - 0.2 mg/dL   Indirect Bilirubin 1.3 (H) 0.3 - 0.9 mg/dL    Comment: Performed at [facility], [place] [address], [place], [place] [address]  Lipase, blood     Status: None   Collection Time: 09/13/19  5:30 PM  Result Value Ref Range   Lipase 36 11 - 51 U/L    Comment: Performed at [facility], [place] [address] [place], [place] [address]    Imaging / Studies: Dg Chest 2 View  Result Date: 09/13/2019 CLINICAL DATA:  Chest pain EXAM: CHEST - 2 VIEW COMPARISON:  06/13/2018 FINDINGS: The heart size and mediastinal contours are stable. Chronic scarring and bronchiectasis within the bilateral lung bases, not significantly changed from prior. No new focal airspace consolidation. No pleural effusion. No pneumothorax. The visualized skeletal structures are unremarkable. IMPRESSION: Chronic bibasilar scarring and bronchiectasis. No new/acute cardiopulmonary findings. Electronically Signed   By: Davina Poke M.D.   On: 09/13/2019 13:55   US Abdomen Limited Ruq  Result Date: 09/13/2019 CLINICAL DATA:  Right upper quadrant pain. EXAM: ULTRASOUND ABDOMEN LIMITED RIGHT UPPER QUADRANT COMPARISON:  None. FINDINGS: Gallbladder: The gallbladder wall measures up to 1 cm in thickness. Multiple stones are seen in the  gallbladder. A positive Murphy's sign is reported. The results sheet was not marked regarding a Murphy's sign but I did speak to the sonographer. No pericholecystic fluid. Common bile duct: Diameter: 5 mm Liver: Increased echogenicity in the liver with no focal mass identified. Portal vein is patent on color Doppler imaging with normal direction of blood flow towards the liver. Other: None. IMPRESSION: 1. Gallbladder wall thickening, stones, and a positive Murphy's sign are most consistent with acute cholecystitis in the appropriate clinical setting. 2. Probable hepatic steatosis. Electronically Signed   By: Dorise Bullion III M.D   On: 09/13/2019 18:25    Medications / Allergies: per chart  Antibiotics: Anti-infectives (From admission, onward)   None        Note: Portions of this report may have been transcribed using voice recognition software. Every effort was made to ensure accuracy; however, inadvertent computerized transcription errors may be present.   Any transcriptional errors that result from this process are unintentional.    Adin Hector, MD, FACS, MASCRS Gastrointestinal and Minimally Invasive Surgery    [address] [place], [place] [address] [phone] Main / Paging [phone] Fax   09/13/2019

## 2019-09-13 NOTE — ED Notes (Signed)
Dr Gross at bedside

## 2019-09-14 DIAGNOSIS — E782 Mixed hyperlipidemia: Secondary | ICD-10-CM

## 2019-09-14 DIAGNOSIS — K819 Cholecystitis, unspecified: Secondary | ICD-10-CM

## 2019-09-14 DIAGNOSIS — G4733 Obstructive sleep apnea (adult) (pediatric): Secondary | ICD-10-CM

## 2019-09-14 DIAGNOSIS — Z7901 Long term (current) use of anticoagulants: Secondary | ICD-10-CM

## 2019-09-14 DIAGNOSIS — I10 Essential (primary) hypertension: Secondary | ICD-10-CM

## 2019-09-14 DIAGNOSIS — I159 Secondary hypertension, unspecified: Secondary | ICD-10-CM

## 2019-09-14 DIAGNOSIS — I480 Paroxysmal atrial fibrillation: Secondary | ICD-10-CM

## 2019-09-14 DIAGNOSIS — I2510 Atherosclerotic heart disease of native coronary artery without angina pectoris: Secondary | ICD-10-CM

## 2019-09-14 DIAGNOSIS — K760 Fatty (change of) liver, not elsewhere classified: Secondary | ICD-10-CM

## 2019-09-14 DIAGNOSIS — K449 Diaphragmatic hernia without obstruction or gangrene: Secondary | ICD-10-CM

## 2019-09-14 DIAGNOSIS — J4540 Moderate persistent asthma, uncomplicated: Secondary | ICD-10-CM

## 2019-09-14 DIAGNOSIS — Z86010 Personal history of colonic polyps: Secondary | ICD-10-CM

## 2019-09-14 DIAGNOSIS — K5730 Diverticulosis of large intestine without perforation or abscess without bleeding: Secondary | ICD-10-CM

## 2019-09-14 DIAGNOSIS — K219 Gastro-esophageal reflux disease without esophagitis: Secondary | ICD-10-CM

## 2019-09-14 DIAGNOSIS — J449 Chronic obstructive pulmonary disease, unspecified: Secondary | ICD-10-CM

## 2019-09-14 LAB — BASIC METABOLIC PANEL
Anion gap: 11 (ref 5–15)
BUN: 15 mg/dL (ref 8–23)
CO2: 22 mmol/L (ref 22–32)
Calcium: 8.9 mg/dL (ref 8.9–10.3)
Chloride: 104 mmol/L (ref 98–111)
Creatinine, Ser: 0.71 mg/dL (ref 0.61–1.24)
GFR calc Af Amer: >60 mL/min (ref >60)
GFR calc non Af Amer: >60 mL/min (ref >60)
Glucose, Bld: 92 mg/dL (ref 70–99)
Potassium: 3.5 mmol/L (ref 3.5–5.1)
Sodium: 137 mmol/L (ref 135–145)

## 2019-09-14 LAB — HEPATIC FUNCTION PANEL
ALT: 26 U/L (ref 0–44)
AST: 22 U/L (ref 15–41)
Albumin: 3.6 g/dL (ref 3.5–5.0)
Alkaline Phosphatase: 46 U/L (ref 38–126)
Bilirubin, Direct: 0.4 mg/dL — ABNORMAL HIGH (ref 0.0–0.2)
Indirect Bilirubin: 1.4 mg/dL — ABNORMAL HIGH (ref 0.3–0.9)
Total Bilirubin: 1.8 mg/dL — ABNORMAL HIGH (ref 0.3–1.2)
Total Protein: 6.3 g/dL — ABNORMAL LOW (ref 6.5–8.1)

## 2019-09-14 LAB — CBC WITH DIFFERENTIAL/PLATELET
Abs Immature Granulocytes: 0.09 10*3/uL — ABNORMAL HIGH (ref 0.00–0.07)
Basophils Absolute: 0.1 10*3/uL (ref 0.0–0.1)
Basophils Relative: 1 %
Eosinophils Absolute: 0.1 10*3/uL (ref 0.0–0.5)
Eosinophils Relative: 1 %
HCT: 46.7 % (ref 39.0–52.0)
Hemoglobin: 15.3 g/dL (ref 13.0–17.0)
Immature Granulocytes: 1 %
Lymphocytes Relative: 18 %
Lymphs Abs: 1.9 10*3/uL (ref 0.7–4.0)
MCH: 31 pg (ref 26.0–34.0)
MCHC: 32.8 g/dL (ref 30.0–36.0)
MCV: 94.7 fL (ref 80.0–100.0)
Monocytes Absolute: 1 10*3/uL (ref 0.1–1.0)
Monocytes Relative: 10 %
Neutro Abs: 7.5 10*3/uL (ref 1.7–7.7)
Neutrophils Relative %: 69 %
Platelets: 254 10*3/uL (ref 150–400)
RBC: 4.93 MIL/uL (ref 4.22–5.81)
RDW: 14.6 % (ref 11.5–15.5)
WBC: 10.7 10*3/uL — ABNORMAL HIGH (ref 4.0–10.5)
nRBC: 0 % (ref 0.0–0.2)

## 2019-09-14 LAB — SURGICAL PCR SCREEN
MRSA, PCR: NEGATIVE
Staphylococcus aureus: NEGATIVE

## 2019-09-14 MED ORDER — ALBUTEROL SULFATE HFA 108 (90 BASE) MCG/ACT IN AERS: 2.0000 | INHALATION_SPRAY | Freq: Four times a day (QID) | RESPIRATORY_TRACT | Status: DC | PRN

## 2019-09-14 MED ORDER — ALBUTEROL SULFATE (2.5 MG/3ML) 0.083% IN NEBU: 2.5000 mg | INHALATION_SOLUTION | Freq: Four times a day (QID) | RESPIRATORY_TRACT | Status: DC | PRN

## 2019-09-14 MED ORDER — ATORVASTATIN CALCIUM 40 MG PO TABS
40.0000 mg | ORAL_TABLET | Freq: Every day | ORAL | Status: DC
  Administered 2019-09-14 – 2019-09-16 (×3): 40 mg via ORAL
  Filled 2019-09-14 (×3): qty 1

## 2019-09-14 MED ORDER — SODIUM CHLORIDE 0.9% FLUSH: 3.0000 mL | INTRAVENOUS | Status: DC | PRN

## 2019-09-14 MED ORDER — LOSARTAN POTASSIUM 50 MG PO TABS
100.0000 mg | ORAL_TABLET | Freq: Every day | ORAL | Status: DC
  Administered 2019-09-14 – 2019-09-16 (×3): 100 mg via ORAL
  Filled 2019-09-14 (×3): qty 2

## 2019-09-14 MED ORDER — METOPROLOL SUCCINATE ER 25 MG PO TB24
25.0000 mg | ORAL_TABLET | Freq: Every day | ORAL | Status: DC
  Administered 2019-09-14 – 2019-09-15 (×2): 25 mg via ORAL
  Filled 2019-09-14 (×3): qty 1

## 2019-09-14 MED ORDER — MOMETASONE FURO-FORMOTEROL FUM 100-5 MCG/ACT IN AERO
2.0000 | INHALATION_SPRAY | Freq: Two times a day (BID) | RESPIRATORY_TRACT | Status: DC
  Administered 2019-09-14 – 2019-09-16 (×4): 2 via RESPIRATORY_TRACT
  Filled 2019-09-14: qty 8.8

## 2019-09-14 MED ORDER — SODIUM CHLORIDE 0.9 % IV SOLN: 250.0000 mL | INTRAVENOUS | Status: DC | PRN

## 2019-09-14 MED ORDER — PANTOPRAZOLE SODIUM 40 MG PO TBEC
40.0000 mg | DELAYED_RELEASE_TABLET | Freq: Every day | ORAL | Status: DC
  Administered 2019-09-14 – 2019-09-16 (×3): 40 mg via ORAL
  Filled 2019-09-14 (×3): qty 1

## 2019-09-14 MED ORDER — HYDROCHLOROTHIAZIDE 25 MG PO TABS: 25.0000 mg | ORAL_TABLET | Freq: Every day | ORAL | Status: DC

## 2019-09-14 MED ORDER — SODIUM CHLORIDE 0.9% FLUSH
3.0000 mL | Freq: Two times a day (BID) | INTRAVENOUS | Status: DC
  Administered 2019-09-14: 3 mL via INTRAVENOUS

## 2019-09-14 NOTE — Progress Notes (Signed)
PROGRESS NOTE    Mario [name]  [id number] DOB: 1946/05/28 DOA: 09/13/2019 PCP: [name], MD   Brief Narrative:  HPI per Dr. Laurey Arrow on 09/13/2019 Mario [name] is a 73 y.o. male with medical history significant for symptomatic cholelithiasis, OSA, HTN, asthma, paroxysmal a fib, who presents with above.  Yesterday evening an hour or so after a meal patient developed a painful sensation upper abdomen/lower chest that was bilateral. Similar to previous gerd exacerbations. But stronger and lasted longer than normal. Had decreased appetite and 2 episodes of nbnb emesis. No diarrhea or constipation. No fevers. No cough. Not exertional, denies history of exertional chest pain. Did not radiate to back. Compliant with home medications. No known covid contacts. No history PE/DVT. Denies toxic habits. No dysuria or hematuria or increased urinary frequency. UTD colon cancer screening.   History biliary colic.  **Interim History   Patient abdominal pain has improved significantly and he states that it is a 0 out of 10.  Continue IV antibiotics and plan is for surgical intervention likely tomorrow with hopeful discharge on Monday  Assessment & Plan:   Principal Problem:   Acute calculous cholecystitis Active Problems:   Essential hypertension   Obstructive sleep apnea   Hyperlipidemia   COPD (chronic obstructive pulmonary disease) with chronic bronchitis (HCC)   Paroxysmal atrial fibrillation (HCC)   Asthma, chronic, moderate persistent vs ACOS in former smoker    Coronary artery calcification seen on CAT scan   Chronic anticoagulation   Hiatal hernia   Personal history of colonic polyps   Diverticulosis of sigmoid colon   Fatty liver   Gastric reflux   Cholecystitis  Symptomatic Biliary Colic, possible Acute on Chronic Calculus cholecystitis  -Has a History biliary colic.  -Symptoms consistent w/ colic and described as a "crampy" pain -U/S findings and positive murphy  sign suggestive of cholecystitis, though abdominal pain has resolved spontaneously. olabs sig for mildly elevated wbcs, t bili 1.6.  gen surg has seen, advising admission for abx, cholecystectomy -C/w Clear liquid diet for now -C/w IV Zosyn per General Surgery Reccomendations -Continue to hold Apixiban -SCDs -Had aow-risk nuclear stress 4 months ago. No new ischemic symptoms. Risk of death per as risk calculator 0.1, lower than average. Normal systolic function on [id number] tte. No ischemic changes seen on EKG. Think further cardiac w/u prior to surgery not necessary. -C/w Pain control with Acetaminophen and C/w Methocarbamol 1000 mg po q6hprn -Continue to [facility]  -Surgery planning for cholecystectomy this admission once he is off Eliquis for greater than 48 hours and ideally this will be tomorrow 09/15/2019 and likely to be laparoscopic with a possible cholangiogram -WBC is 13.2 -> 10.7  Hyponatremia  -Was new and Mild (132).  -Etiology  possibly 2/2 pain, recent nausea/vomiting but likley is from HCTZ -Repeat Sodium this AM improved to 137 -Repeat CMP in AM   Hyperbilirubinemia  -In the Setting of Symptomatic Cholelithiasis -T Bili is now 1.4 and likely reactive  -Continue to Monitor and Trend -Repeat CMP in AM   HTN  -Initial bp elevated, now wnl at 110/66 -Resumed home antihypertensives with Metoprolol Succinate 25 mg po Daily, and Losartan 100 mg po Daily -Will stop HCTZ given recent Hyponatremia and Hypochloremia   HLD -Continue home Atorvastatin 40 mg po Daily  Asthma, Moderate COPD with Chronic Bronchitis  Bronchiectasis without Complication  -Continue Albuterol 2.5 mg Neb q6hprn Wheezing/SOBand substitute formulary equivalent for home Symbicort with Dulera 2 puff IH BID -C/w Guaifenesin-Dextromethorphan  10 mL po q4hprn Cough/Chest Congestion   Paroxysmal Atrial Fibrillation CAD -Currently in NSR and his CHA2DS2-VASc is 2 for Age and HTN History  -Currently Holding  Apixaban as above, bridging anticoagulation does not appear to be indicated -C/w Metoprolol, Losartan, Atorvastatin, and   OSA -C/w CPAP  Overweight -Estimated body mass index is 28.38 kg/m as calculated from the following:   Height as of this encounter: 5\' 9"  (1.753 m).   Weight as of 06/27/19: 87.2 kg. -Weight Loss and Dietary Counseling   GERD -C/w Pantoprazole 40 mg po Daily  DVT prophylaxis: SCDs as Eliquis is currently being held Code Status: FULL CODE  Family Communication: No family present at bedside  Disposition Plan: Pending further Surgical Intervention and Recc's  Consultants:   General Surgery   Procedures: None   Antimicrobials:  Anti-infectives (From admission, onward)   Start     Dose/Rate Route Frequency Ordered Stop   09/14/19 0600  cefTRIAXone (ROCEPHIN) 2 g in sodium chloride 0.9 % 100 mL IVPB  Status:  Discontinued     2 g 200 mL/hr over 30 Minutes Intravenous On call to O.R. 09/13/19 2016 09/13/19 2027   09/14/19 0600  metroNIDAZOLE (FLAGYL) IVPB 500 mg  Status:  Discontinued     500 mg 100 mL/hr over 60 Minutes Intravenous On call to O.R. 09/13/19 2016 09/13/19 2027   09/13/19 2200  piperacillin-tazobactam (ZOSYN) IVPB 3.375 g     3.375 g 12.5 mL/hr over 240 Minutes Intravenous Every 8 hours 09/13/19 2016       Subjective: Seen and examined at bedside and patient's abdominal pain improved and resolved.  Denies any lightheadedness or dizziness.  No chest pain or shortness of breath.  No other concerns or complaints at this time is hopeful for surgical intervention tomorrow.  Objective: Vitals:   09/13/19 2231 09/13/19 2257 09/14/19 0526 09/14/19 0817  BP: 110/83 130/80 110/66   Pulse: 88 90 70   Resp: 15 17 18    Temp:  98.5 F (36.9 C) 98.2 F (36.8 C)   TempSrc:  Oral Oral   SpO2: 98% 95% 97% 96%  Height:   5\' 9"  (1.753 m)     Intake/Output Summary (Last 24 hours) at 09/14/2019 1353 Last data filed at 09/14/2019 0931 Gross per 24 hour   Intake 1331.3 ml  Output 400 ml  Net 931.3 ml   There were no vitals filed for this visit.  Examination: Physical Exam:  Constitutional: WN/W overweight Caucasian male  in NAD and appears calm  Eyes: Lids and conjunctivae normal, sclerae anicteric  ENMT: External Ears, Nose appear normal. Grossly normal hearing. Neck: Appears normal, supple, no cervical masses, normal ROM, no appreciable thyromegaly; no JVD Respiratory: Diminished to auscultation bilaterally, no wheezing, rales, rhonchi or crackles. Normal respiratory effort and patient is not tachypenic. No accessory muscle use.  Cardiovascular: RRR, no murmurs / rubs / gallops. S1 and S2 auscultated. No extremity edema. Abdomen: Soft, non-tender, Slightly distended. Bowel sounds positive x4.  GU: Deferred. Musculoskeletal: No clubbing / cyanosis of digits/nails. No joint deformity upper and lower extremities.  Skin: No rashes, lesions, ulcers on a limited skin evaluation No induration; Warm and dry.  Neurologic: CN 2-12 grossly intact with no focal deficits. Romberg sign and cerebellar reflexes not assessed.  Psychiatric: Normal judgment and insight. Alert and oriented x 3. Normal mood and appropriate affect.   Data Reviewed: I have personally reviewed following labs and imaging studies  CBC: Recent Labs  Lab 09/13/19 1318 09/14/19  0336  WBC 13.2* 10.7*  NEUTROABS  --  7.5  HGB 15.3 15.3  HCT 46.2 46.7  MCV 94.3 94.7  PLT 256 0000000   Basic Metabolic Panel: Recent Labs  Lab 09/13/19 1318 09/14/19 0336  NA 132* 137  K 4.0 3.5  CL 96* 104  CO2 27 22  GLUCOSE 114* 92  BUN 23 15  CREATININE 0.82 0.71  CALCIUM 9.1 8.9   GFR: CrCl cannot be calculated (Unknown ideal weight.). Liver Function Tests: Recent Labs  Lab 09/13/19 1730 09/14/19 0336  AST 23 22  ALT 27 26  ALKPHOS 45 46  BILITOT 1.6* 1.8*  PROT 6.7 6.3*  ALBUMIN 3.8 3.6   Recent Labs  Lab 09/13/19 1730  LIPASE 36   No results for input(s):  AMMONIA in the last 168 hours. Coagulation Profile: No results for input(s): INR, PROTIME in the last 168 hours. Cardiac Enzymes: No results for input(s): CKTOTAL, CKMB, CKMBINDEX, TROPONINI in the last 168 hours. BNP (last 3 results) No results for input(s): PROBNP in the last 8760 hours. HbA1C: No results for input(s): HGBA1C in the last 72 hours. CBG: No results for input(s): GLUCAP in the last 168 hours. Lipid Profile: No results for input(s): CHOL, HDL, LDLCALC, TRIG, CHOLHDL, LDLDIRECT in the last 72 hours. Thyroid Function Tests: No results for input(s): TSH, T4TOTAL, FREET4, T3FREE, THYROIDAB in the last 72 hours. Anemia Panel: No results for input(s): VITAMINB12, FOLATE, FERRITIN, TIBC, IRON, RETICCTPCT in the last 72 hours. Sepsis Labs: No results for input(s): PROCALCITON, LATICACIDVEN in the last 168 hours.  Recent Results (from the past 240 hour(s))  SARS Coronavirus 2 [facility] order, Performed in [facility] hospital lab) Nasopharyngeal Nasopharyngeal Swab     Status: None   Collection Time: 09/13/19  8:14 PM   Specimen: Nasopharyngeal Swab  Result Value Ref Range Status   SARS Coronavirus 2 NEGATIVE NEGATIVE Final    Comment: (NOTE) If result is NEGATIVE SARS-CoV-2 target nucleic acids are NOT DETECTED. The SARS-CoV-2 RNA is generally detectable in upper and lower  respiratory specimens during the acute phase of infection. The lowest  concentration of SARS-CoV-2 viral copies this assay can detect is 250  copies / mL. A negative result does not preclude SARS-CoV-2 infection  and should not be used as the sole basis for treatment or other  patient management decisions.  A negative result may occur with  improper specimen collection / handling, submission of specimen other  than nasopharyngeal swab, presence of viral mutation(s) within the  areas targeted by this assay, and inadequate number of viral copies  (<250 copies / mL). A negative result must be combined  with clinical  observations, patient history, and epidemiological information. If result is POSITIVE SARS-CoV-2 target nucleic acids are DETECTED. The SARS-CoV-2 RNA is generally detectable in upper and lower  respiratory specimens dur ing the acute phase of infection.  Positive  results are indicative of active infection with SARS-CoV-2.  Clinical  correlation with patient history and other diagnostic information is  necessary to determine patient infection status.  Positive results do  not rule out bacterial infection or co-infection with other viruses. If result is PRESUMPTIVE POSTIVE SARS-CoV-2 nucleic acids MAY BE PRESENT.   A presumptive positive result was obtained on the submitted specimen  and confirmed on repeat testing.  While 2019 novel coronavirus  (SARS-CoV-2) nucleic acids may be present in the submitted sample  additional confirmatory testing may be necessary for epidemiological  and / or clinical management purposes  to differentiate between  SARS-CoV-2 and other Sarbecovirus currently known to infect humans.  If clinically indicated additional testing with an alternate test  methodology [phone]) is advised. The SARS-CoV-2 RNA is generally  detectable in upper and lower respiratory sp ecimens during the acute  phase of infection. The expected result is Negative. Fact Sheet for Patients:  StrictlyIdeas.no Fact Sheet for Healthcare Providers: BankingDealers.co.za This test is not yet approved or cleared by the [place] FDA and has been authorized for detection and/or diagnosis of SARS-CoV-2 by FDA under an Emergency Use Authorization (EUA).  This EUA will remain in effect (meaning this test can be used) for the duration of the COVID-19 declaration under Section 564(b)(1) of the Act, 21 U.S.C. section 360bbb-3(b)(1), unless the authorization is terminated or revoked sooner. Performed at [facility], [place] [address], [place], [place] [address]      Radiology Studies: Dg Chest 2 View  Result Date: 09/13/2019 CLINICAL DATA:  Chest pain EXAM: CHEST - 2 VIEW COMPARISON:  06/13/2018 FINDINGS: The heart size and mediastinal contours are stable. Chronic scarring and bronchiectasis within the bilateral lung bases, not significantly changed from prior. No new focal airspace consolidation. No pleural effusion. No pneumothorax. The visualized skeletal structures are unremarkable. IMPRESSION: Chronic bibasilar scarring and bronchiectasis. No new/acute cardiopulmonary findings. Electronically Signed   By: Davina Poke M.D.   On: 09/13/2019 13:55   US Abdomen Limited Ruq  Result Date: 09/13/2019 CLINICAL DATA:  Right upper quadrant pain. EXAM: ULTRASOUND ABDOMEN LIMITED RIGHT UPPER QUADRANT COMPARISON:  None. FINDINGS: Gallbladder: The gallbladder wall measures up to 1 cm in thickness. Multiple stones are seen in the gallbladder. A positive Murphy's sign is reported. The results sheet was not marked regarding a Murphy's sign but I did speak to the sonographer. No pericholecystic fluid. Common bile duct: Diameter: 5 mm Liver: Increased echogenicity in the liver with no focal mass identified. Portal vein is patent on color Doppler imaging with normal direction of blood flow towards the liver. Other: None. IMPRESSION: 1. Gallbladder wall thickening, stones, and a positive Murphy's sign are most consistent with acute cholecystitis in the appropriate clinical setting. 2. Probable hepatic steatosis. Electronically Signed   By: Dorise Bullion III M.D   On: 09/13/2019 18:25   Scheduled Meds: . atorvastatin  40 mg Oral Daily  . lip balm  1 application Topical BID  . losartan  100 mg Oral Daily  . metoprolol succinate  25 mg Oral Daily  . mometasone-formoterol  2 puff Inhalation BID  . pantoprazole  40 mg Oral Daily  . psyllium  1 packet Oral Daily  . sodium chloride flush  3 mL Intravenous Q12H    Continuous Infusions: . sodium chloride    . methocarbamol (ROBAXIN) IV    . piperacillin-tazobactam (ZOSYN)  IV 3.375 g (09/14/19 0537)    LOS: 1 day   Kerney Elbe, DO [facility] PAGER is on [place]  If 7PM-7AM, please contact night-coverage [email or web address] Password [email or web address] 09/14/2019, 1:53 PM

## 2019-09-14 NOTE — Progress Notes (Signed)
PROGRESS NOTE    Mario [name]  [id number] DOB: [birth year] DOA: 09/13/2019 PCP: [name], MD   Brief Narrative:  HPI per Dr. Laurey Arrow on 09/13/2019 Mario [name] is a 73 y.o. male with medical history significant for symptomatic cholelithiasis, OSA, HTN, asthma, paroxysmal a fib, who presents with above.  Yesterday evening an hour or so after a meal patient developed a painful sensation upper abdomen/lower chest that was bilateral. Similar to previous gerd exacerbations. But stronger and lasted longer than normal. Had decreased appetite and 2 episodes of nbnb emesis. No diarrhea or constipation. No fevers. No cough. Not exertional, denies history of exertional chest pain. Did not radiate to back. Compliant with home medications. No known covid contacts. No history PE/DVT. Denies toxic habits. No dysuria or hematuria or increased urinary frequency. UTD colon cancer screening.   History biliary colic.  **Interim History   Patient abdominal pain has improved significantly and he states that it is a 0 out of 10.  Continue IV antibiotics and plan is for surgical intervention likely tomorrow with hopeful discharge on Monday  Assessment & Plan:   Principal Problem:   Acute calculous cholecystitis Active Problems:   Essential hypertension   Obstructive sleep apnea   Hyperlipidemia   COPD (chronic obstructive pulmonary disease) with chronic bronchitis (HCC)   Paroxysmal atrial fibrillation (HCC)   Asthma, chronic, moderate persistent vs ACOS in former smoker    Coronary artery calcification seen on CAT scan   Chronic anticoagulation   Hiatal hernia   Personal history of colonic polyps   Diverticulosis of sigmoid colon   Fatty liver   Gastric reflux   Cholecystitis  Symptomatic Biliary Colic, possible Acute on Chronic Calculus cholecystitis  -Has a History biliary colic.  -Symptoms consistent w/ colic and described as a "crampy" pain -U/S findings and positive murphy  sign suggestive of cholecystitis, though abdominal pain has resolved spontaneously. olabs sig for mildly elevated wbcs, t bili 1.6.  gen surg has seen, advising admission for abx, cholecystectomy -C/w Clear liquid diet for now -C/w IV Zosyn per General Surgery Reccomendations -Continue to hold Apixiban -SCDs -Had aow-risk nuclear stress 4 months ago. No new ischemic symptoms. Risk of death per as risk calculator 0.1, lower than average. Normal systolic function on [id number] tte. No ischemic changes seen on EKG. Think further cardiac w/u prior to surgery not necessary. -C/w Pain control with Acetaminophen and C/w Methocarbamol 1000 mg po q6hprn -Continue to [facility]  -Surgery planning for cholecystectomy this admission once he is off Eliquis for greater than 48 hours and ideally this will be tomorrow 09/15/2019 and likely to be laparoscopic with a possible cholangiogram -WBC is 13.2 -> 10.7  Hyponatremia  -Was new and Mild (132).  -Etiology  possibly 2/2 pain, recent nausea/vomiting but likley is from HCTZ -Repeat Sodium this AM improved to 137 -Repeat CMP in AM   Hyperbilirubinemia  -In the Setting of Symptomatic Cholelithiasis -T Bili is now 1.4 and likely reactive  -Continue to Monitor and Trend -Repeat CMP in AM   HTN  -Initial bp elevated, now wnl at 110/66 -Resumed home antihypertensives with Metoprolol Succinate 25 mg po Daily, and Losartan 100 mg po Daily -Will stop HCTZ given recent Hyponatremia and Hypochloremia   HLD -Continue home Atorvastatin 40 mg po Daily  Asthma, Moderate COPD with Chronic Bronchitis  Bronchiectasis without Complication  -Continue Albuterol 2.5 mg Neb q6hprn Wheezing/SOBand substitute formulary equivalent for home Symbicort with Dulera 2 puff IH BID -C/w Guaifenesin-Dextromethorphan  10 mL po q4hprn Cough/Chest Congestion   Paroxysmal Atrial Fibrillation CAD -Currently in NSR and his CHA2DS2-VASc is 2 for Age and HTN History  -Currently Holding  Apixaban as above, bridging anticoagulation does not appear to be indicated -C/w Metoprolol, Losartan, Atorvastatin, and   OSA -C/w CPAP  Overweight -Estimated body mass index is 28.38 kg/m as calculated from the following:   Height as of this encounter: 5\' 9"  (1.753 m).   Weight as of 06/27/19: 87.2 kg. -Weight Loss and Dietary Counseling   DVT prophylaxis: SCDs as Eliquis is currently being held Code Status: FULL CODE  Family Communication: No family present at bedside  Disposition Plan: Pending further Surgical Intervention and Recc's  Consultants:   General Surgery   Procedures: None   Antimicrobials:  Anti-infectives (From admission, onward)   Start     Dose/Rate Route Frequency Ordered Stop   09/14/19 0600  cefTRIAXone (ROCEPHIN) 2 g in sodium chloride 0.9 % 100 mL IVPB  Status:  Discontinued     2 g 200 mL/hr over 30 Minutes Intravenous On call to O.R. 09/13/19 2016 09/13/19 2027   09/14/19 0600  metroNIDAZOLE (FLAGYL) IVPB 500 mg  Status:  Discontinued     500 mg 100 mL/hr over 60 Minutes Intravenous On call to O.R. 09/13/19 2016 09/13/19 2027   09/13/19 2200  piperacillin-tazobactam (ZOSYN) IVPB 3.375 g     3.375 g 12.5 mL/hr over 240 Minutes Intravenous Every 8 hours 09/13/19 2016       Subjective: Seen and examined at bedside and patient's abdominal pain improved and resolved.  Denies any lightheadedness or dizziness.  No chest pain or shortness of breath.  No other concerns or complaints at this time is hopeful for surgical intervention tomorrow.  Objective: Vitals:   09/13/19 2009 09/13/19 2231 09/13/19 2257 09/14/19 0526  BP: (!) 143/78 110/83 130/80 110/66  Pulse: 71 88 90 70  Resp: 15 15 17 18   Temp:   98.5 F (36.9 C) 98.2 F (36.8 C)  TempSrc:   Oral Oral  SpO2: 97% 98% 95% 97%  Height:    5\' 9"  (1.753 m)    Intake/Output Summary (Last 24 hours) at 09/14/2019 0810 Last data filed at 09/14/2019 0759 Gross per 24 hour  Intake 611.3 ml  Output  400 ml  Net 211.3 ml   There were no vitals filed for this visit.  Examination: Physical Exam:  Constitutional: WN/W overweight Caucasian male  in NAD and appears calm  Eyes: Lids and conjunctivae normal, sclerae anicteric  ENMT: External Ears, Nose appear normal. Grossly normal hearing. Neck: Appears normal, supple, no cervical masses, normal ROM, no appreciable thyromegaly; no JVD Respiratory: Diminished to auscultation bilaterally, no wheezing, rales, rhonchi or crackles. Normal respiratory effort and patient is not tachypenic. No accessory muscle use.  Cardiovascular: RRR, no murmurs / rubs / gallops. S1 and S2 auscultated. No extremity edema. Abdomen: Soft, non-tender, Slightly distended. Bowel sounds positive x4.  GU: Deferred. Musculoskeletal: No clubbing / cyanosis of digits/nails. No joint deformity upper and lower extremities.  Skin: No rashes, lesions, ulcers on a limited skin evaluation No induration; Warm and dry.  Neurologic: CN 2-12 grossly intact with no focal deficits. Romberg sign and cerebellar reflexes not assessed.  Psychiatric: Normal judgment and insight. Alert and oriented x 3. Normal mood and appropriate affect.   Data Reviewed: I have personally reviewed following labs and imaging studies  CBC: Recent Labs  Lab 09/13/19 1318  WBC 13.2*  HGB 15.3  HCT  46.2  MCV 94.3  PLT [id number]   Basic Metabolic Panel: Recent Labs  Lab 09/13/19 1318 09/14/19 0336  NA 132* 137  K 4.0 3.5  CL 96* 104  CO2 27 22  GLUCOSE 114* 92  BUN 23 15  CREATININE 0.82 0.71  CALCIUM 9.1 8.9   GFR: CrCl cannot be calculated (Unknown ideal weight.). Liver Function Tests: Recent Labs  Lab 09/13/19 1730  AST 23  ALT 27  ALKPHOS 45  BILITOT 1.6*  PROT 6.7  ALBUMIN 3.8   Recent Labs  Lab 09/13/19 1730  LIPASE 36   No results for input(s): AMMONIA in the last 168 hours. Coagulation Profile: No results for input(s): INR, PROTIME in the last 168 hours. Cardiac Enzymes:  No results for input(s): CKTOTAL, CKMB, CKMBINDEX, TROPONINI in the last 168 hours. BNP (last 3 results) No results for input(s): PROBNP in the last 8760 hours. HbA1C: No results for input(s): HGBA1C in the last 72 hours. CBG: No results for input(s): GLUCAP in the last 168 hours. Lipid Profile: No results for input(s): CHOL, HDL, LDLCALC, TRIG, CHOLHDL, LDLDIRECT in the last 72 hours. Thyroid Function Tests: No results for input(s): TSH, T4TOTAL, FREET4, T3FREE, THYROIDAB in the last 72 hours. Anemia Panel: No results for input(s): VITAMINB12, FOLATE, FERRITIN, TIBC, IRON, RETICCTPCT in the last 72 hours. Sepsis Labs: No results for input(s): PROCALCITON, LATICACIDVEN in the last 168 hours.  Recent Results (from the past 240 hour(s))  SARS Coronavirus 2 [facility] order, Performed in [facility] hospital lab) Nasopharyngeal Nasopharyngeal Swab     Status: None   Collection Time: 09/13/19  8:14 PM   Specimen: Nasopharyngeal Swab  Result Value Ref Range Status   SARS Coronavirus 2 NEGATIVE NEGATIVE Final    Comment: (NOTE) If result is NEGATIVE SARS-CoV-2 target nucleic acids are NOT DETECTED. The SARS-CoV-2 RNA is generally detectable in upper and lower  respiratory specimens during the acute phase of infection. The lowest  concentration of SARS-CoV-2 viral copies this assay can detect is 250  copies / mL. A negative result does not preclude SARS-CoV-2 infection  and should not be used as the sole basis for treatment or other  patient management decisions.  A negative result may occur with  improper specimen collection / handling, submission of specimen other  than nasopharyngeal swab, presence of viral mutation(s) within the  areas targeted by this assay, and inadequate number of viral copies  (<250 copies / mL). A negative result must be combined with clinical  observations, patient history, and epidemiological information. If result is POSITIVE SARS-CoV-2 target nucleic acids  are DETECTED. The SARS-CoV-2 RNA is generally detectable in upper and lower  respiratory specimens dur ing the acute phase of infection.  Positive  results are indicative of active infection with SARS-CoV-2.  Clinical  correlation with patient history and other diagnostic information is  necessary to determine patient infection status.  Positive results do  not rule out bacterial infection or co-infection with other viruses. If result is PRESUMPTIVE POSTIVE SARS-CoV-2 nucleic acids MAY BE PRESENT.   A presumptive positive result was obtained on the submitted specimen  and confirmed on repeat testing.  While 2019 novel coronavirus  (SARS-CoV-2) nucleic acids may be present in the submitted sample  additional confirmatory testing may be necessary for epidemiological  and / or clinical management purposes  to differentiate between  SARS-CoV-2 and other Sarbecovirus currently known to infect humans.  If clinically indicated additional testing with an alternate test  methodology [phone]) is  advised. The SARS-CoV-2 RNA is generally  detectable in upper and lower respiratory sp ecimens during the acute  phase of infection. The expected result is Negative. Fact Sheet for Patients:  StrictlyIdeas.no Fact Sheet for Healthcare Providers: BankingDealers.co.za This test is not yet approved or cleared by the [place] FDA and has been authorized for detection and/or diagnosis of SARS-CoV-2 by FDA under an Emergency Use Authorization (EUA).  This EUA will remain in effect (meaning this test can be used) for the duration of the COVID-19 declaration under Section 564(b)(1) of the Act, 21 U.S.C. section 360bbb-3(b)(1), unless the authorization is terminated or revoked sooner. Performed at [facility], [place] [address], [place], [place] [address]      Radiology Studies: Dg Chest 2 View  Result Date: 09/13/2019 CLINICAL DATA:   Chest pain EXAM: CHEST - 2 VIEW COMPARISON:  06/13/2018 FINDINGS: The heart size and mediastinal contours are stable. Chronic scarring and bronchiectasis within the bilateral lung bases, not significantly changed from prior. No new focal airspace consolidation. No pleural effusion. No pneumothorax. The visualized skeletal structures are unremarkable. IMPRESSION: Chronic bibasilar scarring and bronchiectasis. No new/acute cardiopulmonary findings. Electronically Signed   By: Davina Poke M.D.   On: 09/13/2019 13:55   US Abdomen Limited Ruq  Result Date: 09/13/2019 CLINICAL DATA:  Right upper quadrant pain. EXAM: ULTRASOUND ABDOMEN LIMITED RIGHT UPPER QUADRANT COMPARISON:  None. FINDINGS: Gallbladder: The gallbladder wall measures up to 1 cm in thickness. Multiple stones are seen in the gallbladder. A positive Murphy's sign is reported. The results sheet was not marked regarding a Murphy's sign but I did speak to the sonographer. No pericholecystic fluid. Common bile duct: Diameter: 5 mm Liver: Increased echogenicity in the liver with no focal mass identified. Portal vein is patent on color Doppler imaging with normal direction of blood flow towards the liver. Other: None. IMPRESSION: 1. Gallbladder wall thickening, stones, and a positive Murphy's sign are most consistent with acute cholecystitis in the appropriate clinical setting. 2. Probable hepatic steatosis. Electronically Signed   By: Dorise Bullion III M.D   On: 09/13/2019 18:25   Scheduled Meds: . atorvastatin  40 mg Oral Daily  . hydrochlorothiazide  25 mg Oral Daily  . lip balm  1 application Topical BID  . losartan  100 mg Oral Daily  . metoprolol succinate  25 mg Oral Daily  . mometasone-formoterol  2 puff Inhalation BID  . pantoprazole  40 mg Oral Daily  . psyllium  1 packet Oral Daily  . sodium chloride flush  3 mL Intravenous Q12H   Continuous Infusions: . sodium chloride    . methocarbamol (ROBAXIN) IV    .  piperacillin-tazobactam (ZOSYN)  IV 3.375 g (09/14/19 0537)    LOS: 1 day   Kerney Elbe, DO [facility] PAGER is on [place]  If 7PM-7AM, please contact night-coverage [email or web address] Password [email or web address] 09/14/2019, 8:10 AM

## 2019-09-14 NOTE — Plan of Care (Signed)
Patient lying in bed this morning; pain and nausea controlled. No needs expressed at this time. Awaiting surgery. Will continue to monitor.

## 2019-09-15 ENCOUNTER — Inpatient Hospital Stay (HOSPITAL_COMMUNITY): Payer: Medicare Other | Admitting: Certified Registered Nurse Anesthetist

## 2019-09-15 ENCOUNTER — Encounter (HOSPITAL_COMMUNITY)
Admission: EM | Disposition: A | Discharge status: Home / Self Care | Payer: Self-pay | Source: Home / Self Care | Attending: Internal Medicine

## 2019-09-15 DIAGNOSIS — I10 Essential (primary) hypertension: Secondary | ICD-10-CM | POA: Diagnosis not present

## 2019-09-15 DIAGNOSIS — G4733 Obstructive sleep apnea (adult) (pediatric): Secondary | ICD-10-CM | POA: Diagnosis not present

## 2019-09-15 DIAGNOSIS — K8012 Calculus of gallbladder with acute and chronic cholecystitis without obstruction: Secondary | ICD-10-CM | POA: Diagnosis not present

## 2019-09-15 DIAGNOSIS — J449 Chronic obstructive pulmonary disease, unspecified: Secondary | ICD-10-CM | POA: Diagnosis not present

## 2019-09-15 HISTORY — PX: LAPAROSCOPIC CHOLECYSTECTOMY SINGLE SITE WITH INTRAOPERATIVE CHOLANGIOGRAM: SHX6538

## 2019-09-15 LAB — CBC WITH DIFFERENTIAL/PLATELET
Abs Immature Granulocytes: 0.05 10*3/uL (ref 0.00–0.07)
Basophils Absolute: 0.1 10*3/uL (ref 0.0–0.1)
Basophils Relative: 1 %
Eosinophils Absolute: 0.2 10*3/uL (ref 0.0–0.5)
Eosinophils Relative: 2 %
HCT: 44.4 % (ref 39.0–52.0)
Hemoglobin: 14.3 g/dL (ref 13.0–17.0)
Immature Granulocytes: 1 %
Lymphocytes Relative: 24 %
Lymphs Abs: 2.5 10*3/uL (ref 0.7–4.0)
MCH: 30.8 pg (ref 26.0–34.0)
MCHC: 32.2 g/dL (ref 30.0–36.0)
MCV: 95.5 fL (ref 80.0–100.0)
Monocytes Absolute: 1 10*3/uL (ref 0.1–1.0)
Monocytes Relative: 9 %
Neutro Abs: 6.8 10*3/uL (ref 1.7–7.7)
Neutrophils Relative %: 63 %
Platelets: 230 10*3/uL (ref 150–400)
RBC: 4.65 MIL/uL (ref 4.22–5.81)
RDW: 14.6 % (ref 11.5–15.5)
WBC: 10.6 10*3/uL — ABNORMAL HIGH (ref 4.0–10.5)
nRBC: 0 % (ref 0.0–0.2)

## 2019-09-15 LAB — COMPREHENSIVE METABOLIC PANEL
ALT: 24 U/L (ref 0–44)
AST: 18 U/L (ref 15–41)
Albumin: 3.3 g/dL — ABNORMAL LOW (ref 3.5–5.0)
Alkaline Phosphatase: 39 U/L (ref 38–126)
Anion gap: 9 (ref 5–15)
BUN: 12 mg/dL (ref 8–23)
CO2: 23 mmol/L (ref 22–32)
Calcium: 8.9 mg/dL (ref 8.9–10.3)
Chloride: 105 mmol/L (ref 98–111)
Creatinine, Ser: 0.93 mg/dL (ref 0.61–1.24)
GFR calc Af Amer: >60 mL/min (ref >60)
GFR calc non Af Amer: >60 mL/min (ref >60)
Glucose, Bld: 89 mg/dL (ref 70–99)
Potassium: 4 mmol/L (ref 3.5–5.1)
Sodium: 137 mmol/L (ref 135–145)
Total Bilirubin: 1.4 mg/dL — ABNORMAL HIGH (ref 0.3–1.2)
Total Protein: 5.6 g/dL — ABNORMAL LOW (ref 6.5–8.1)

## 2019-09-15 LAB — PHOSPHORUS: Phosphorus: 3 mg/dL (ref 2.5–4.6)

## 2019-09-15 LAB — MAGNESIUM: Magnesium: 2.1 mg/dL (ref 1.7–2.4)

## 2019-09-15 SURGERY — LAPAROSCOPIC CHOLECYSTECTOMY SINGLE SITE WITH INTRAOPERATIVE CHOLANGIOGRAM
Anesthesia: General | Site: Abdomen

## 2019-09-15 MED ORDER — ALUM & MAG HYDROXIDE-SIMETH 200-200-20 MG/5ML PO SUSP: 30.0000 mL | Freq: Four times a day (QID) | ORAL | Status: DC | PRN

## 2019-09-15 MED ORDER — LACTATED RINGERS IV SOLN: INTRAVENOUS | Status: DC

## 2019-09-15 MED ORDER — ONDANSETRON HCL 4 MG/2ML IJ SOLN
INTRAMUSCULAR | Status: DC | PRN
  Administered 2019-09-15: 4 mg via INTRAVENOUS

## 2019-09-15 MED ORDER — SODIUM CHLORIDE 0.9 % IV SOLN
2.0000 g | INTRAVENOUS | Status: AC
  Administered 2019-09-15: 08:00:00 2 g via INTRAVENOUS

## 2019-09-15 MED ORDER — FENTANYL CITRATE (PF) 100 MCG/2ML IJ SOLN
25.0000 ug | INTRAMUSCULAR | Status: DC | PRN
  Administered 2019-09-15 (×2): 50 ug via INTRAVENOUS

## 2019-09-15 MED ORDER — METRONIDAZOLE IN NACL 5-0.79 MG/ML-% IV SOLN
500.0000 mg | INTRAVENOUS | Status: AC
  Administered 2019-09-15: 08:00:00 500 mg via INTRAVENOUS
  Filled 2019-09-15 (×3): qty 100

## 2019-09-15 MED ORDER — SODIUM CHLORIDE 0.9 % IV SOLN: 250.0000 mL | INTRAVENOUS | Status: DC | PRN

## 2019-09-15 MED ORDER — ROCURONIUM BROMIDE 10 MG/ML (PF) SYRINGE
PREFILLED_SYRINGE | INTRAVENOUS | Status: AC
  Filled 2019-09-15: qty 10

## 2019-09-15 MED ORDER — KETOROLAC TROMETHAMINE 30 MG/ML IJ SOLN
INTRAMUSCULAR | Status: DC | PRN
  Administered 2019-09-15: 15 mg via INTRAVENOUS

## 2019-09-15 MED ORDER — SUCCINYLCHOLINE CHLORIDE 200 MG/10ML IV SOSY
PREFILLED_SYRINGE | INTRAVENOUS | Status: AC
  Filled 2019-09-15: qty 10

## 2019-09-15 MED ORDER — LACTATED RINGERS IV BOLUS: 1000.0000 mL | Freq: Three times a day (TID) | INTRAVENOUS | Status: DC | PRN

## 2019-09-15 MED ORDER — GABAPENTIN 300 MG PO CAPS
300.0000 mg | ORAL_CAPSULE | ORAL | Status: AC
  Administered 2019-09-15: 300 mg via ORAL
  Filled 2019-09-15: qty 1

## 2019-09-15 MED ORDER — STERILE WATER FOR IRRIGATION IR SOLN
Status: DC | PRN
  Administered 2019-09-15: 1000 mL

## 2019-09-15 MED ORDER — FENTANYL CITRATE (PF) 100 MCG/2ML IJ SOLN
INTRAMUSCULAR | Status: AC
  Filled 2019-09-15: qty 2

## 2019-09-15 MED ORDER — ACETAMINOPHEN 500 MG PO TABS
ORAL_TABLET | ORAL | Status: AC
  Filled 2019-09-15: qty 2

## 2019-09-15 MED ORDER — PROCHLORPERAZINE EDISYLATE 10 MG/2ML IJ SOLN: 5.0000 mg | INTRAMUSCULAR | Status: DC | PRN

## 2019-09-15 MED ORDER — SUGAMMADEX SODIUM 200 MG/2ML IV SOLN
INTRAVENOUS | Status: DC | PRN
  Administered 2019-09-15: 200 mg via INTRAVENOUS

## 2019-09-15 MED ORDER — SODIUM CHLORIDE 0.9% FLUSH
3.0000 mL | Freq: Two times a day (BID) | INTRAVENOUS | Status: DC
  Administered 2019-09-15: 3 mL via INTRAVENOUS

## 2019-09-15 MED ORDER — OXYCODONE HCL 5 MG PO TABS: 5.0000 mg | ORAL_TABLET | Freq: Once | ORAL | Status: DC | PRN

## 2019-09-15 MED ORDER — BUPIVACAINE LIPOSOME 1.3 % IJ SUSP
20.0000 mL | Freq: Once | INTRAMUSCULAR | Status: AC
  Administered 2019-09-15: 20 mL
  Filled 2019-09-15: qty 20

## 2019-09-15 MED ORDER — OXYCODONE HCL 5 MG PO TABS
5.0000 mg | ORAL_TABLET | ORAL | Status: DC | PRN
  Filled 2019-09-15: qty 2

## 2019-09-15 MED ORDER — OXYCODONE HCL 5 MG/5ML PO SOLN: 5.0000 mg | Freq: Once | ORAL | Status: DC | PRN

## 2019-09-15 MED ORDER — FENTANYL CITRATE (PF) 250 MCG/5ML IJ SOLN
INTRAMUSCULAR | Status: AC
  Filled 2019-09-15: qty 5

## 2019-09-15 MED ORDER — CHLORHEXIDINE GLUCONATE CLOTH 2 % EX PADS: 6.0000 | MEDICATED_PAD | Freq: Once | CUTANEOUS | Status: DC

## 2019-09-15 MED ORDER — BUPIVACAINE LIPOSOME 1.3 % IJ SUSP
20.0000 mL | Freq: Once | INTRAMUSCULAR | Status: DC
  Filled 2019-09-15: qty 20

## 2019-09-15 MED ORDER — LIDOCAINE 2% (20 MG/ML) 5 ML SYRINGE
INTRAMUSCULAR | Status: DC | PRN
  Administered 2019-09-15: 80 mg via INTRAVENOUS

## 2019-09-15 MED ORDER — 0.9 % SODIUM CHLORIDE (POUR BTL) OPTIME
TOPICAL | Status: DC | PRN
  Administered 2019-09-15: 1000 mL

## 2019-09-15 MED ORDER — BUPIVACAINE-EPINEPHRINE 0.5% -1:200000 IJ SOLN
INTRAMUSCULAR | Status: AC
  Filled 2019-09-15: qty 1

## 2019-09-15 MED ORDER — LIDOCAINE 2% (20 MG/ML) 5 ML SYRINGE
INTRAMUSCULAR | Status: AC
  Filled 2019-09-15: qty 5

## 2019-09-15 MED ORDER — ONDANSETRON HCL 4 MG/2ML IJ SOLN
INTRAMUSCULAR | Status: AC
  Filled 2019-09-15: qty 2

## 2019-09-15 MED ORDER — ACETAMINOPHEN 500 MG PO TABS
1000.0000 mg | ORAL_TABLET | ORAL | Status: AC
  Administered 2019-09-15: 1000 mg via ORAL

## 2019-09-15 MED ORDER — EPHEDRINE 5 MG/ML INJ
INTRAVENOUS | Status: AC
  Filled 2019-09-15: qty 10

## 2019-09-15 MED ORDER — ONDANSETRON HCL 4 MG/2ML IJ SOLN: 4.0000 mg | Freq: Once | INTRAMUSCULAR | Status: DC | PRN

## 2019-09-15 MED ORDER — SODIUM CHLORIDE 0.9% FLUSH: 3.0000 mL | INTRAVENOUS | Status: DC | PRN

## 2019-09-15 MED ORDER — ROCURONIUM BROMIDE 50 MG/5ML IV SOSY
PREFILLED_SYRINGE | INTRAVENOUS | Status: DC | PRN
  Administered 2019-09-15: 10 mg via INTRAVENOUS
  Administered 2019-09-15: 50 mg via INTRAVENOUS

## 2019-09-15 MED ORDER — PROPOFOL 10 MG/ML IV BOLUS
INTRAVENOUS | Status: AC
  Filled 2019-09-15: qty 20

## 2019-09-15 MED ORDER — BUPIVACAINE-EPINEPHRINE 0.5% -1:200000 IJ SOLN
INTRAMUSCULAR | Status: DC | PRN
  Administered 2019-09-15: 40 mL

## 2019-09-15 MED ORDER — LACTATED RINGERS IV SOLN
INTRAVENOUS | Status: AC | PRN
  Administered 2019-09-15: 3000 mL

## 2019-09-15 MED ORDER — DEXAMETHASONE SODIUM PHOSPHATE 10 MG/ML IJ SOLN
INTRAMUSCULAR | Status: DC | PRN
  Administered 2019-09-15: 10 mg via INTRAVENOUS

## 2019-09-15 MED ORDER — LACTATED RINGERS IV SOLN
INTRAVENOUS | Status: DC | PRN
  Administered 2019-09-15: 08:00:00 via INTRAVENOUS

## 2019-09-15 MED ORDER — PROPOFOL 10 MG/ML IV BOLUS
INTRAVENOUS | Status: DC | PRN
  Administered 2019-09-15: 110 mg via INTRAVENOUS

## 2019-09-15 MED ORDER — METHOCARBAMOL 500 MG IVPB - SIMPLE MED
INTRAVENOUS | Status: AC
  Administered 2019-09-15: 500 mg via INTRAVENOUS
  Filled 2019-09-15: qty 50

## 2019-09-15 MED ORDER — PHENYLEPHRINE HCL (PRESSORS) 10 MG/ML IV SOLN
INTRAVENOUS | Status: AC
  Filled 2019-09-15: qty 1

## 2019-09-15 MED ORDER — FENTANYL CITRATE (PF) 100 MCG/2ML IJ SOLN
INTRAMUSCULAR | Status: DC | PRN
  Administered 2019-09-15: 50 ug via INTRAVENOUS
  Administered 2019-09-15: 100 ug via INTRAVENOUS

## 2019-09-15 MED ORDER — EPHEDRINE SULFATE-NACL 50-0.9 MG/10ML-% IV SOSY
PREFILLED_SYRINGE | INTRAVENOUS | Status: DC | PRN
  Administered 2019-09-15 (×4): 5 mg via INTRAVENOUS

## 2019-09-15 MED ORDER — SODIUM CHLORIDE 0.9 % IV SOLN
INTRAVENOUS | Status: AC
  Filled 2019-09-15: qty 20

## 2019-09-15 SURGICAL SUPPLY — 46 items
APL PRP STRL LF DISP 70% ISPRP (MISCELLANEOUS) ×1
APPLIER CLIP 5 13 M/L LIGAMAX5 (MISCELLANEOUS) ×2
APR CLP MED LRG 5 ANG JAW (MISCELLANEOUS) ×1
BAG SPEC RTRVL 10 TROC 200 (ENDOMECHANICALS) ×1
CABLE HIGH FREQUENCY MONO STRZ (ELECTRODE) ×2 IMPLANT
CHLORAPREP W/TINT 26 (MISCELLANEOUS) ×2 IMPLANT
CLIP APPLIE 5 13 M/L LIGAMAX5 (MISCELLANEOUS) ×1 IMPLANT
COVER MAYO STAND STRL (DRAPES) ×2 IMPLANT
COVER SURGICAL LIGHT HANDLE (MISCELLANEOUS) ×2 IMPLANT
COVER WAND RF STERILE (DRAPES) IMPLANT
DECANTER SPIKE VIAL GLASS SM (MISCELLANEOUS) ×2 IMPLANT
DRAIN CHANNEL 19F RND (DRAIN) IMPLANT
DRAPE C-ARM 42X120 X-RAY (DRAPES) ×2 IMPLANT
DRAPE WARM FLUID 44X44 (DRAPES) ×2 IMPLANT
DRSG TEGADERM 4X4.75 (GAUZE/BANDAGES/DRESSINGS) ×2 IMPLANT
ELECT REM PT RETURN 15FT ADLT (MISCELLANEOUS) ×2 IMPLANT
ENDOLOOP SUT PDS II  0 18 (SUTURE) ×2
ENDOLOOP SUT PDS II 0 18 (SUTURE) IMPLANT
EVACUATOR SILICONE 100CC (DRAIN) IMPLANT
GAUZE SPONGE 2X2 8PLY STRL LF (GAUZE/BANDAGES/DRESSINGS) ×1 IMPLANT
GLOVE ECLIPSE 8.0 STRL XLNG CF (GLOVE) ×2 IMPLANT
GLOVE INDICATOR 8.0 STRL GRN (GLOVE) ×2 IMPLANT
GOWN STRL REUS W/TWL XL LVL3 (GOWN DISPOSABLE) ×4 IMPLANT
IRRIG SUCT STRYKERFLOW 2 WTIP (MISCELLANEOUS) ×2
IRRIGATION SUCT STRKRFLW 2 WTP (MISCELLANEOUS) ×1 IMPLANT
KIT BASIN OR (CUSTOM PROCEDURE TRAY) ×2 IMPLANT
KIT TURNOVER KIT A (KITS) IMPLANT
PAD POSITIONING PINK XL (MISCELLANEOUS) ×2 IMPLANT
POUCH RETRIEVAL ECOSAC 10 (ENDOMECHANICALS) IMPLANT
POUCH RETRIEVAL ECOSAC 10MM (ENDOMECHANICALS) ×1
PROTECTOR NERVE ULNAR (MISCELLANEOUS) ×1 IMPLANT
SCISSORS LAP 5X35 DISP (ENDOMECHANICALS) ×2 IMPLANT
SET CHOLANGIOGRAPH MIX (MISCELLANEOUS) ×2 IMPLANT
SET TUBE SMOKE EVAC HIGH FLOW (TUBING) ×2 IMPLANT
SHEARS HARMONIC ACE PLUS 36CM (ENDOMECHANICALS) ×2 IMPLANT
SLEEVE XCEL OPT CAN 5 100 (ENDOMECHANICALS) ×1 IMPLANT
SPONGE GAUZE 2X2 STER 10/PKG (GAUZE/BANDAGES/DRESSINGS) ×1
SUT MNCRL AB 4-0 PS2 18 (SUTURE) ×2 IMPLANT
SUT PDS AB 1 CT1 27 (SUTURE) ×4 IMPLANT
SUT PDS AB 3-0 SH 27 (SUTURE) ×2 IMPLANT
SYR 20ML LL LF (SYRINGE) ×2 IMPLANT
TOWEL OR 17X26 10 PK STRL BLUE (TOWEL DISPOSABLE) ×2 IMPLANT
TOWEL OR NON WOVEN STRL DISP B (DISPOSABLE) ×2 IMPLANT
TRAY LAPAROSCOPIC (CUSTOM PROCEDURE TRAY) ×2 IMPLANT
TROCAR BLADELESS OPT 5 100 (ENDOMECHANICALS) ×2 IMPLANT
TROCAR BLADELESS OPT 5 150 (ENDOMECHANICALS) ×2 IMPLANT

## 2019-09-15 NOTE — Anesthesia Postprocedure Evaluation (Signed)
Anesthesia Post Note  Patient: Mario [name]  Procedure(s) Performed: LAPAROSCOPIC CHOLECYSTECTOMY SINGLE SITE (N/A Abdomen)     Patient location during evaluation: PACU Anesthesia Type: General Level of consciousness: awake and alert Pain management: pain level controlled Vital Signs Assessment: post-procedure vital signs reviewed and stable Respiratory status: spontaneous breathing, nonlabored ventilation, respiratory function stable and patient connected to nasal cannula oxygen Cardiovascular status: blood pressure returned to baseline and stable Postop Assessment: no apparent nausea or vomiting Anesthetic complications: no    Last Vitals:  Vitals:   09/15/19 1035 09/15/19 1045  BP:  (!) 156/71  Pulse: 61 67  Resp: 18 14  Temp:    SpO2: 95% 95%    Last Pain:  Vitals:   09/15/19 1045  TempSrc:   PainSc: 6                  Lidia Collum

## 2019-09-15 NOTE — Transfer of Care (Signed)
Immediate Anesthesia Transfer of Care Note  Patient: Mario [name]  Procedure(s) Performed: LAPAROSCOPIC CHOLECYSTECTOMY SINGLE SITE (N/A Abdomen)  Patient Location: PACU  Anesthesia Type:General  Level of Consciousness: drowsy  Airway & Oxygen Therapy: Patient Spontanous Breathing and Patient connected to face mask oxygen  Post-op Assessment: Report given to RN and Post -op Vital signs reviewed and stable  Post vital signs: Reviewed and stable  Last Vitals:  Vitals Value Taken Time  BP 171/104 09/15/19 0957  Temp    Pulse 67 09/15/19 0959  Resp 14 09/15/19 0959  SpO2 100 % 09/15/19 0959  Vitals shown include unvalidated device data.  Last Pain:  Vitals:   09/15/19 0515  TempSrc: Oral  PainSc:       Patients Stated Pain Goal: 3 ([id number])  Complications: No apparent anesthesia complications

## 2019-09-15 NOTE — Interval H&P Note (Signed)
History and Physical Interval Note:  09/15/2019 7:13 AM  Mario [name]  has presented today for surgery, with the diagnosis of gallstones.  The various methods of treatment have been discussed with the patient and family. After consideration of risks, benefits and other options for treatment, the patient has consented to  Procedure(s): [place] CHOLANGIOGRAM (N/A) as a surgical intervention.  The patient's history has been reviewed, patient examined, no change in status, stable for surgery.  I have reviewed the patient's chart and labs.  Questions were answered to the patient's satisfaction.    I have re-reviewed the the patient's records, history, medications, and allergies.  I have re-examined the patient.  I again discussed intraoperative plans and goals of post-operative recovery.  The patient agrees to proceed.  Mario [name]  Aug 16, 1946 [id number]  Patient Care Team: [name], MD as PCP - General (Family Medicine) Tanda Rockers, MD as Consulting Physician (Pulmonary Disease) [name], MD as Consulting Physician (Cardiology) Milus Banister, MD as Consulting Physician (Gastroenterology)  Patient Active Problem List   Diagnosis Date Noted  . Chronic anticoagulation 09/13/2019    Priority: Medium  . Paroxysmal atrial fibrillation ([place]) 12/29/2017    Priority: Medium  . COPD (chronic obstructive pulmonary disease) with chronic bronchitis ([place])     Priority: Medium  . Obstructive sleep apnea     Priority: Medium  . Acute calculous cholecystitis 09/13/2019  . Hiatal hernia 09/13/2019  . Personal history of colonic polyps 09/13/2019  . Diverticulosis of sigmoid colon 09/13/2019  . Fatty liver 09/13/2019  . Gastric reflux 09/13/2019  . Cholecystitis 09/13/2019  . Cyst of pancreas   . Renal disorder   . Coronary artery calcification seen on CAT scan   . Chronic cough 08/21/2018  . Chronic frontal sinusitis  07/31/2018  . 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 Deviated nasal septum 07/31/2018  . Asthma, chronic, moderate persistent vs ACOS in former smoker  07/06/2018  . Bronchiectasis without complication ([place]) [id number]  . Hyperlipidemia   . Biliary colic 123456  . Epigastric abdominal pain 12/12/2012  . Essential hypertension 12/24/2009  . KNEE PAIN, LEFT 12/24/2009  . MEDIAL MENISCUS TEAR, LEFT 12/24/2009    Past Medical History:  Diagnosis Date  . Asthma   . Biliary colic 123456  . COPD (chronic obstructive pulmonary disease) with chronic bronchitis ([place])   . Coronary artery calcification seen on CAT scan   . Cyst of pancreas    1 cm (7/18), repeat ct in 12 months.    . Hyperlipidemia   . Hypertension   . Obstructive sleep apnea   . Renal disorder   . Rhinitis   . Sleep apnea    pt denies sleep apnea-lost weight    Past Surgical History:  Procedure Laterality Date  . COLONOSCOPY  09/29/2014  . LITHOTRIPSY  2000   for kidney stone    Social History   Socioeconomic History  . Marital status: Married    Spouse name: Not on file  . Number of children: Not on file  . Years of education: Not on file  . Highest education level: Not on file  Occupational History  . Not on file  Social Needs  . Financial resource strain: Not on file  . Food insecurity    Worry: Not on file    Inability: Not on file  . Transportation needs    Medical: Not on file    Non-medical: Not on file  Tobacco Use  . Smoking status: Former Smoker    Packs/day: 2.00    Years: 11.00    Pack years: 22.00    Types: Cigarettes    Quit date: 12/19/1974    Years since quitting: 44.7  . Smokeless tobacco: Never Used  Substance and Sexual Activity  . Alcohol use: Yes    Alcohol/week: 1.0 standard drinks    Types: 1 Glasses of wine per week  . Drug use: No  . Sexual activity: Not on file  Lifestyle  . Physical activity    Days per week: Not on file    Minutes per session: Not on file  .  Stress: Not on file  Relationships  . Social Herbalist on phone: Not on file    Gets together: Not on file    Attends religious service: Not on file    Active member of club or organization: Not on file    Attends meetings of clubs or organizations: Not on file    Relationship status: Not on file  . Intimate partner violence    Fear of current or ex partner: Not on file    Emotionally abused: Not on file    Physically abused: Not on file    Forced sexual activity: Not on file  Other Topics Concern  . Not on file  Social History Narrative  . Not on file    Family History  Problem Relation Age of Onset  . Rheumatic fever Mother   . Stroke Father   . Atrial fibrillation Father   . Colon polyps Father   . Arthritis Sister   . Colon cancer Neg Hx   . Esophageal cancer Neg Hx   . Rectal cancer Neg Hx   . Stomach cancer Neg Hx     Medications Prior to Admission  Medication Sig Dispense Refill Last Dose  . albuterol (PROVENTIL) (2.5 MG/3ML) 0.083% nebulizer solution Take 3 mLs (2.5 mg total) by nebulization every 6 (six) hours as needed for wheezing or shortness of breath. 75 mL 12 Past Week at Unknown time  . atorvastatin (LIPITOR) 40 MG tablet Take 40 mg by mouth daily.   09/12/2019 at Unknown time  . Cholecalciferol (VITAMIN D3) 125 MCG (5000 UT) TABS Take by mouth.   09/12/2019 at Unknown time  . Coenzyme Q10 (CO Q-10) 100 MG CAPS Take 1 capsule by mouth daily.   09/12/2019 at Unknown time  . dextromethorphan-guaiFENesin (MUCINEX DM) 30-600 MG 12hr tablet Take 1 tablet by mouth at bedtime as needed for cough.   09/12/2019 at Unknown time  . FIBER COMPLETE PO Take 1 tablet by mouth daily.   09/12/2019 at Unknown time  . glucosamine-chondroitin 500-400 MG tablet Take 1 tablet by mouth 3 (three) times daily.   09/12/2019 at Unknown time  . hydrochlorothiazide (HYDRODIURIL) 25 MG tablet Take 1 tablet (25 mg total) by mouth daily. 90 tablet 3 09/13/2019 at 10am  . losartan  (COZAAR) 100 MG tablet Take 1 tablet (100 mg total) by mouth daily. 90 tablet 3 09/13/2019 at 10am  . metoprolol succinate (TOPROL-XL) 25 MG 24 hr tablet TAKE 1 TABLET BY MOUTH EVERY DAY (Patient taking differently: Take 25 mg by mouth daily. ) 90 tablet 3 09/13/2019 at 10am  . montelukast (SINGULAIR) 10 MG tablet TAKE 1 TABLET BY MOUTH EVERY DAY (Patient taking differently: Take 10 mg by mouth daily. ) 90 tablet 1 09/12/2019 at Unknown time  . pantoprazole (PROTONIX) 40 MG tablet TAKE  1 TABLET BY MOUTH TWICE A DAY (Patient taking differently: Take 40 mg by mouth daily. ) 180 tablet 1 09/12/2019 at Unknown time  . PROAIR HFA 108 (90 Base) MCG/ACT inhaler TAKE 2 PUFFS BY MOUTH EVERY 6 HOURS AS NEEDED FOR WHEEZE OR SHORTNESS OF BREATH (Patient taking differently: Inhale 2 puffs into the lungs every 6 (six) hours as needed for wheezing or shortness of breath. ) 8.5 Inhaler 2 09/12/2019 at Unknown time  . SYMBICORT 160-4.5 MCG/ACT inhaler INHALE 2 PUFFS INTO THE LUNGS 2 TIMES DAILY. (Patient taking differently: Inhale 2 puffs into the lungs 2 (two) times daily. ) 30.6 Inhaler 4 09/13/2019 at Unknown time  . therapeutic multivitamin-minerals (THERAGRAN-M) tablet Take 1 tablet by mouth daily.   09/12/2019 at Unknown time  . UNABLE TO FIND Med Name: clear lungs otc 4 x daily   09/12/2019 at Unknown time  . apixaban (ELIQUIS) 5 MG TABS tablet Take 1 tablet (5 mg total) by mouth 2 (two) times daily. 180 tablet 1   . budesonide-formoterol (SYMBICORT) 80-4.5 MCG/ACT inhaler Inhale 2 puffs into the lungs 2 (two) times daily. 1 Inhaler 11   . budesonide-formoterol (SYMBICORT) 80-4.5 MCG/ACT inhaler Inhale 2 puffs into the lungs 2 (two) times daily. (Patient not taking: Reported on 09/13/2019) 1 Inhaler 0 Not Taking at Unknown time  . Respiratory Therapy Supplies (FLUTTER) DEVI Use as directed 1 each 0     Current Facility-Administered Medications  Medication Dose Route Frequency Provider Last Rate Last Dose  . 0.9 %   sodium chloride infusion  250 mL Intravenous PRN Wouk, Ailene Rud, MD      . acetaminophen (TYLENOL) 500 MG tablet           . acetaminophen (TYLENOL) suppository 650 mg  650 mg Rectal Q6H PRN Wouk, Ailene Rud, MD      . acetaminophen (TYLENOL) tablet 1,000 mg  1,000 mg Oral On Call to OR Traylon Boston, MD      . acetaminophen (TYLENOL) tablet 325-650 mg  325-650 mg Oral Q6H PRN Gwynne Edinger, MD   650 mg at 09/14/19 0532  . albuterol (PROVENTIL) (2.5 MG/3ML) 0.083% nebulizer solution 2.5 mg  2.5 mg Nebulization Q6H PRN Wouk, Ailene Rud, MD      . alum & mag hydroxide-simeth (MAALOX/MYLANTA) 200-200-20 MG/5ML suspension 30 mL  30 mL Oral Q6H PRN Wouk, Ailene Rud, MD      . atorvastatin (LIPITOR) tablet 40 mg  40 mg Oral Daily Gwynne Edinger, MD   40 mg at 09/14/19 1420  . bisacodyl (DULCOLAX) suppository 10 mg  10 mg Rectal Q12H PRN Wouk, Ailene Rud, MD      . bupivacaine liposome (EXPAREL) 1.3 % injection 266 mg  20 mL Infiltration Once Jasraj Boston, MD      . cefTRIAXone (ROCEPHIN) 2 g in sodium chloride 0.9 % 100 mL IVPB  2 g Intravenous On Call to OR Keondrick Boston, MD       And  . metroNIDAZOLE (FLAGYL) IVPB 500 mg  500 mg Intravenous On Call to OR Alante Boston, MD      . Chlorhexidine Gluconate Cloth 2 % PADS 6 each  6 each Topical Once Jashua Boston, MD       And  . Chlorhexidine Gluconate Cloth 2 % PADS 6 each  6 each Topical Once Keivon Boston, MD      . gabapentin (NEURONTIN) capsule 300 mg  300 mg Oral On Call to OR Tywone Boston, MD      .  guaiFENesin-dextromethorphan (ROBITUSSIN DM) 100-10 MG/5ML syrup 10 mL  10 mL Oral Q4H PRN Wouk, Ailene Rud, MD      . hydrocortisone (ANUSOL-HC) 2.5 % rectal cream 1 application  1 application Topical QID PRN Wouk, Ailene Rud, MD      . hydrocortisone cream 1 % 1 application  1 application Topical TID PRN Wouk, Ailene Rud, MD      . lip balm (CARMEX) ointment 1 application  1 application Topical BID Gwynne Edinger, MD    1 application at [id number]  . losartan (COZAAR) tablet 100 mg  100 mg Oral Daily Gwynne Edinger, MD   100 mg at 09/14/19 1419  . magic mouthwash  15 mL Oral QID PRN Wouk, Ailene Rud, MD      . menthol-cetylpyridinium (CEPACOL) lozenge 3 mg  1 lozenge Oral PRN Wouk, Ailene Rud, MD      . methocarbamol (ROBAXIN) 1,000 mg in dextrose 5 % 50 mL IVPB  1,000 mg Intravenous Q6H PRN Wouk, Ailene Rud, MD      . methocarbamol (ROBAXIN) tablet 1,000 mg  1,000 mg Oral Q6H PRN Wouk, Ailene Rud, MD      . metoprolol succinate (TOPROL-XL) 24 hr tablet 25 mg  25 mg Oral Daily Gwynne Edinger, MD   25 mg at 09/14/19 1420  . metoprolol tartrate (LOPRESSOR) injection 5 mg  5 mg Intravenous Q6H PRN Wouk, Ailene Rud, MD      . mometasone-formoterol Scnetx) 100-5 MCG/ACT inhaler 2 puff  2 puff Inhalation BID Gwynne Edinger, MD   2 puff at 09/14/19 1955  . pantoprazole (PROTONIX) EC tablet 40 mg  40 mg Oral Daily Gwynne Edinger, MD   40 mg at 09/14/19 1421  . phenol (CHLORASEPTIC) mouth spray 2 spray  2 spray Mouth/Throat PRN Wouk, Ailene Rud, MD      . piperacillin-tazobactam (ZOSYN) IVPB 3.375 g  3.375 g Intravenous Q8H Gwynne Edinger, MD 12.5 mL/hr at 09/15/19 0509 3.375 g at 09/15/19 0509  . psyllium (HYDROCIL/METAMUCIL) packet 1 packet  1 packet Oral Daily Wouk, Ailene Rud, MD   1 packet at 09/14/19 1420  . sodium chloride 0.9 % with cefTRIAXone (ROCEPHIN) ADS Med           . sodium chloride flush (NS) 0.9 % injection 3 mL  3 mL Intravenous Q12H Wouk, Ailene Rud, MD   3 mL at 09/14/19 1422  . sodium chloride flush (NS) 0.9 % injection 3 mL  3 mL Intravenous PRN Wouk, Ailene Rud, MD         No Known Allergies  BP (!) 135/58 (BP Location: Left Arm)   Pulse (!) 45   Temp 98.2 F (36.8 C) (Oral)   Resp 18   Ht 5' 9.02" (1.753 m)   SpO2 98%   BMI 28.37 kg/m   Labs: Results for orders placed or performed during the hospital encounter of 09/13/19 (from the past 48  hour(s))  Basic metabolic panel     Status: Abnormal   Collection Time: 09/13/19  1:18 PM  Result Value Ref Range   Sodium 132 (L) 135 - 145 mmol/L   Potassium 4.0 3.5 - 5.1 mmol/L   Chloride 96 (L) 98 - 111 mmol/L   CO2 27 22 - 32 mmol/L   Glucose, Bld 114 (H) 70 - 99 mg/dL   BUN 23 8 - 23 mg/dL   Creatinine, Ser 0.82 0.61 - 1.24 mg/dL   Calcium 9.1 8.9 - 10.3  mg/dL   GFR calc non Af Amer >60 >60 mL/min   GFR calc Af Amer >60 >60 mL/min   Anion gap 9 5 - 15    Comment: Performed at [facility], [place] [address], [place], [place] [address]  CBC     Status: Abnormal   Collection Time: 09/13/19  1:18 PM  Result Value Ref Range   WBC 13.2 (H) 4.0 - 10.5 K/uL   RBC 4.90 4.22 - 5.81 MIL/uL   Hemoglobin 15.3 13.0 - 17.0 g/dL   HCT 46.2 39.0 - 52.0 %   MCV 94.3 80.0 - 100.0 fL   MCH 31.2 26.0 - 34.0 pg   MCHC 33.1 30.0 - 36.0 g/dL   RDW 14.4 11.5 - 15.5 %   Platelets 256 150 - 400 K/uL   nRBC 0.0 0.0 - 0.2 %    Comment: Performed at [facility], [place] [address] [place], [place] [address]  Troponin I (High Sensitivity)     Status: None   Collection Time: 09/13/19  1:18 PM  Result Value Ref Range   Troponin I (High Sensitivity) 5 <18 ng/L    Comment: (NOTE) Elevated high sensitivity troponin I (hsTnI) values and significant  changes across serial measurements may suggest ACS but many other  chronic and acute conditions are known to elevate hsTnI results.  Refer to the "Links" section for chest pain algorithms and additional  guidance. Performed at [facility], [place] [address], [place], [place] [address]   Troponin I (High Sensitivity)     Status: None   Collection Time: 09/13/19  5:30 PM  Result Value Ref Range   Troponin I (High Sensitivity) 5 <18 ng/L    Comment: (NOTE) Elevated high sensitivity troponin I (hsTnI) values and significant  changes across serial measurements may suggest ACS but many other  chronic and  acute conditions are known to elevate hsTnI results.  Refer to the "Links" section for chest pain algorithms and additional  guidance. Performed at [facility], [place] [address] [place], [place] [address]   Hepatic function panel     Status: Abnormal   Collection Time: 09/13/19  5:30 PM  Result Value Ref Range   Total Protein 6.7 6.5 - 8.1 g/dL   Albumin 3.8 3.5 - 5.0 g/dL   AST 23 15 - 41 U/L   ALT 27 0 - 44 U/L   Alkaline Phosphatase 45 38 - 126 U/L   Total Bilirubin 1.6 (H) 0.3 - 1.2 mg/dL   Bilirubin, Direct 0.3 (H) 0.0 - 0.2 mg/dL   Indirect Bilirubin 1.3 (H) 0.3 - 0.9 mg/dL    Comment: Performed at [facility], [place] [address] [place], [place] [address]  Lipase, blood     Status: None   Collection Time: 09/13/19  5:30 PM  Result Value Ref Range   Lipase 36 11 - 51 U/L    Comment: Performed at [facility], [place] [address] [place], [place] [address]  SARS Coronavirus 2 [facility] order, Performed in [facility] hospital lab) Nasopharyngeal Nasopharyngeal Swab     Status: None   Collection Time: 09/13/19  8:14 PM   Specimen: Nasopharyngeal Swab  Result Value Ref Range   SARS Coronavirus 2 NEGATIVE NEGATIVE    Comment: (NOTE) If result is NEGATIVE SARS-CoV-2 target nucleic acids are NOT DETECTED. The SARS-CoV-2 RNA is generally detectable in upper and lower  respiratory specimens during the acute phase of infection. The  lowest  concentration of SARS-CoV-2 viral copies this assay can detect is 250  copies / mL. A negative result does not preclude SARS-CoV-2 infection  and should not be used as the sole basis for treatment or other  patient management decisions.  A negative result may occur with  improper specimen collection / handling, submission of specimen other  than nasopharyngeal swab, presence of viral mutation(s) within the  areas targeted by this assay, and inadequate number of viral copies  (<250 copies / mL).  A negative result must be combined with clinical  observations, patient history, and epidemiological information. If result is POSITIVE SARS-CoV-2 target nucleic acids are DETECTED. The SARS-CoV-2 RNA is generally detectable in upper and lower  respiratory specimens dur ing the acute phase of infection.  Positive  results are indicative of active infection with SARS-CoV-2.  Clinical  correlation with patient history and other diagnostic information is  necessary to determine patient infection status.  Positive results do  not rule out bacterial infection or co-infection with other viruses. If result is PRESUMPTIVE POSTIVE SARS-CoV-2 nucleic acids MAY BE PRESENT.   A presumptive positive result was obtained on the submitted specimen  and confirmed on repeat testing.  While 2019 novel coronavirus  (SARS-CoV-2) nucleic acids may be present in the submitted sample  additional confirmatory testing may be necessary for epidemiological  and / or clinical management purposes  to differentiate between  SARS-CoV-2 and other Sarbecovirus currently known to infect humans.  If clinically indicated additional testing with an alternate test  methodology [phone]) is advised. The SARS-CoV-2 RNA is generally  detectable in upper and lower respiratory sp ecimens during the acute  phase of infection. The expected result is Negative. Fact Sheet for Patients:  StrictlyIdeas.no Fact Sheet for Healthcare Providers: BankingDealers.co.za This test is not yet approved or cleared by the [place] FDA and has been authorized for detection and/or diagnosis of SARS-CoV-2 by FDA under an Emergency Use Authorization (EUA).  This EUA will remain in effect (meaning this test can be used) for the duration of the COVID-19 declaration under Section 564(b)(1) of the Act, 21 U.S.C. section 360bbb-3(b)(1), unless the authorization is terminated or revoked  sooner. Performed at [facility], [place] [address] [place], [place] [id number]   Basic metabolic panel     Status: None   Collection Time: 09/14/19  3:36 AM  Result Value Ref Range   Sodium 137 135 - 145 mmol/L   Potassium 3.5 3.5 - 5.1 mmol/L   Chloride 104 98 - 111 mmol/L   CO2 22 22 - 32 mmol/L   Glucose, Bld 92 70 - 99 mg/dL   BUN 15 8 - 23 mg/dL   Creatinine, Ser 0.71 0.61 - 1.24 mg/dL   Calcium 8.9 8.9 - 10.3 mg/dL   GFR calc non Af Amer >60 >60 mL/min   GFR calc Af Amer >60 >60 mL/min   Anion gap 11 5 - 15    Comment: Performed at [facility], [place] [address] [place], [place] [address]  Hepatic function panel     Status: Abnormal   Collection Time: 09/14/19  3:36 AM  Result Value Ref Range   Total Protein 6.3 (L) 6.5 - 8.1 g/dL   Albumin 3.6 3.5 - 5.0 g/dL   AST 22 15 - 41 U/L   ALT 26 0 - 44 U/L   Alkaline Phosphatase 46 38 - 126 U/L   Total Bilirubin 1.8 (H) 0.3 - 1.2 mg/dL  Bilirubin, Direct 0.4 (H) 0.0 - 0.2 mg/dL   Indirect Bilirubin 1.4 (H) 0.3 - 0.9 mg/dL    Comment: Performed at [facility], [place] [address], [place], [place] [address]  CBC with Differential/Platelet     Status: Abnormal   Collection Time: 09/14/19  3:36 AM  Result Value Ref Range   WBC 10.7 (H) 4.0 - 10.5 K/uL   RBC 4.93 4.22 - 5.81 MIL/uL   Hemoglobin 15.3 13.0 - 17.0 g/dL   HCT 46.7 39.0 - 52.0 %   MCV 94.7 80.0 - 100.0 fL   MCH 31.0 26.0 - 34.0 pg   MCHC 32.8 30.0 - 36.0 g/dL   RDW 14.6 11.5 - 15.5 %   Platelets 254 150 - 400 K/uL   nRBC 0.0 0.0 - 0.2 %   Neutrophils Relative % 69 %   Neutro Abs 7.5 1.7 - 7.7 K/uL   Lymphocytes Relative 18 %   Lymphs Abs 1.9 0.7 - 4.0 K/uL   Monocytes Relative 10 %   Monocytes Absolute 1.0 0.1 - 1.0 K/uL   Eosinophils Relative 1 %   Eosinophils Absolute 0.1 0.0 - 0.5 K/uL   Basophils Relative 1 %   Basophils Absolute 0.1 0.0 - 0.1 K/uL   Immature Granulocytes 1 %   Abs Immature  Granulocytes 0.09 (H) 0.00 - 0.07 K/uL    Comment: Performed at [facility], [place] [address] [place], [place] [address]  Surgical pcr screen     Status: None   Collection Time: 09/14/19  1:47 PM   Specimen: Nasal Mucosa; Nasal Swab  Result Value Ref Range   MRSA, PCR NEGATIVE NEGATIVE   Staphylococcus aureus NEGATIVE NEGATIVE    Comment: (NOTE) The Xpert SA Assay (FDA approved for NASAL specimens in patients 74 years of age and older), is one component of a comprehensive surveillance program. It is not intended to diagnose infection nor to guide or monitor treatment. Performed at [facility], [place] [address] [place], [place] [address]   Magnesium     Status: None   Collection Time: 09/15/19  3:07 AM  Result Value Ref Range   Magnesium 2.1 1.7 - 2.4 mg/dL    Comment: Performed at [facility], [place] [address] [place], [place] [address]  Phosphorus     Status: None   Collection Time: 09/15/19  3:07 AM  Result Value Ref Range   Phosphorus 3.0 2.5 - 4.6 mg/dL    Comment: Performed at [facility], [place] [address] [place], [place] [address]  Comprehensive metabolic panel     Status: Abnormal   Collection Time: 09/15/19  3:07 AM  Result Value Ref Range   Sodium 137 135 - 145 mmol/L   Potassium 4.0 3.5 - 5.1 mmol/L   Chloride 105 98 - 111 mmol/L   CO2 23 22 - 32 mmol/L   Glucose, Bld 89 70 - 99 mg/dL   BUN 12 8 - 23 mg/dL   Creatinine, Ser 0.93 0.61 - 1.24 mg/dL   Calcium 8.9 8.9 - 10.3 mg/dL   Total Protein 5.6 (L) 6.5 - 8.1 g/dL   Albumin 3.3 (L) 3.5 - 5.0 g/dL   AST 18 15 - 41 U/L   ALT 24 0 - 44 U/L   Alkaline Phosphatase 39 38 - 126 U/L   Total Bilirubin 1.4 (H) 0.3 - 1.2 mg/dL   GFR calc non Af Amer >60 >60 mL/min   GFR calc Af Amer >  60 >60 mL/min   Anion gap 9 5 - 15    Comment: Performed at [facility], [place] [address], [place], [place] [address]  CBC with  Differential/Platelet     Status: Abnormal   Collection Time: 09/15/19  3:07 AM  Result Value Ref Range   WBC 10.6 (H) 4.0 - 10.5 K/uL   RBC 4.65 4.22 - 5.81 MIL/uL   Hemoglobin 14.3 13.0 - 17.0 g/dL   HCT 44.4 39.0 - 52.0 %   MCV 95.5 80.0 - 100.0 fL   MCH 30.8 26.0 - 34.0 pg   MCHC 32.2 30.0 - 36.0 g/dL   RDW 14.6 11.5 - 15.5 %   Platelets 230 150 - 400 K/uL   nRBC 0.0 0.0 - 0.2 %   Neutrophils Relative % 63 %   Neutro Abs 6.8 1.7 - 7.7 K/uL   Lymphocytes Relative 24 %   Lymphs Abs 2.5 0.7 - 4.0 K/uL   Monocytes Relative 9 %   Monocytes Absolute 1.0 0.1 - 1.0 K/uL   Eosinophils Relative 2 %   Eosinophils Absolute 0.2 0.0 - 0.5 K/uL   Basophils Relative 1 %   Basophils Absolute 0.1 0.0 - 0.1 K/uL   Immature Granulocytes 1 %   Abs Immature Granulocytes 0.05 0.00 - 0.07 K/uL    Comment: Performed at [facility], [place] [address] [place], [place] [address]    Imaging / Studies: Dg Chest 2 View  Result Date: 09/13/2019 CLINICAL DATA:  Chest pain EXAM: CHEST - 2 VIEW COMPARISON:  06/13/2018 FINDINGS: The heart size and mediastinal contours are stable. Chronic scarring and bronchiectasis within the bilateral lung bases, not significantly changed from prior. No new focal airspace consolidation. No pleural effusion. No pneumothorax. The visualized skeletal structures are unremarkable. IMPRESSION: Chronic bibasilar scarring and bronchiectasis. No new/acute cardiopulmonary findings. Electronically Signed   By: Davina Poke M.D.   On: 09/13/2019 13:55   US Abdomen Limited Ruq  Result Date: 09/13/2019 CLINICAL DATA:  Right upper quadrant pain. EXAM: ULTRASOUND ABDOMEN LIMITED RIGHT UPPER QUADRANT COMPARISON:  None. FINDINGS: Gallbladder: The gallbladder wall measures up to 1 cm in thickness. Multiple stones are seen in the gallbladder. A positive Murphy's sign is reported. The results sheet was not marked regarding a Murphy's sign but I did speak to the sonographer. No  pericholecystic fluid. Common bile duct: Diameter: 5 mm Liver: Increased echogenicity in the liver with no focal mass identified. Portal vein is patent on color Doppler imaging with normal direction of blood flow towards the liver. Other: None. IMPRESSION: 1. Gallbladder wall thickening, stones, and a positive Murphy's sign are most consistent with acute cholecystitis in the appropriate clinical setting. 2. Probable hepatic steatosis. Electronically Signed   By: Dorise Bullion III M.D   On: 09/13/2019 18:25     .Adin Hector, M.D., F.A.C.S. Gastrointestinal and Minimally Invasive Surgery [facility] [address] [place], [place] [address] [phone] Main / Paging  09/15/2019 7:13 AM    Adin Hector

## 2019-09-15 NOTE — Anesthesia Procedure Notes (Signed)
Procedure Name: Intubation Date/Time: 09/15/2019 7:55 AM Performed by: Montel Clock, CRNA Pre-anesthesia Checklist: Patient identified, Emergency Drugs available, Suction available, Patient being monitored and Timeout performed Patient Re-evaluated:Patient Re-evaluated prior to induction Oxygen Delivery Method: Circle system utilized Preoxygenation: Pre-oxygenation with 100% oxygen Induction Type: IV induction Ventilation: Mask ventilation without difficulty and Oral airway inserted - appropriate to patient size Laryngoscope Size: Mac and 3 Grade View: Grade II Tube type: Oral Tube size: 7.5 mm Number of attempts: 1 Airway Equipment and Method: Stylet Placement Confirmation: ETT inserted through vocal cords under direct vision,  positive ETCO2 and breath sounds checked- equal and bilateral Secured at: 23 cm Tube secured with: Tape Dental Injury: Teeth and Oropharynx as per pre-operative assessment

## 2019-09-15 NOTE — Progress Notes (Signed)
PROGRESS NOTE    Mario [name]  [id number] DOB: 07/07/1946 DOA: 09/13/2019 PCP: [name], MD   Brief Narrative:  HPI per Dr. Laurey Arrow on 09/13/2019 Mario [name] is a 72 y.o. male with medical history significant for symptomatic cholelithiasis, OSA, HTN, asthma, paroxysmal a fib, who presents with above.  Yesterday evening an hour or so after a meal patient developed a painful sensation upper abdomen/lower chest that was bilateral. Similar to previous gerd exacerbations. But stronger and lasted longer than normal. Had decreased appetite and 2 episodes of nbnb emesis. No diarrhea or constipation. No fevers. No cough. Not exertional, denies history of exertional chest pain. Did not radiate to back. Compliant with home medications. No known covid contacts. No history PE/DVT. Denies toxic habits. No dysuria or hematuria or increased urinary frequency. UTD colon cancer screening.   History biliary colic.  **Interim History   Patient abdominal pain has improved significantly and he states that it is a 0 out of 10.  IV antibiotics with Zosyn are being continued and patient underwent surgical intervention today and was found to have acute on chronic calculus cholecystitis.  Patient also had a wedge biopsy done of his liver given suspicious findings for fatty liver changes.  Assessment & Plan:   Principal Problem:   Acute calculous cholecystitis Active Problems:   Essential hypertension   Obstructive sleep apnea   Hyperlipidemia   COPD (chronic obstructive pulmonary disease) with chronic bronchitis (HCC)   Paroxysmal atrial fibrillation (HCC)   Asthma, chronic, moderate persistent vs ACOS in former smoker    Coronary artery calcification seen on CAT scan   Chronic anticoagulation   Hiatal hernia   Personal history of colonic polyps   Diverticulosis of sigmoid colon   Fatty liver   Gastric reflux   Cholecystitis   Symptomatic Biliary Colic, possible Acute on Chronic  Calculus cholecystitis status post laparoscopic cholecystectomy, lysis of adhesions and wedge biopsy of the liver as well as oversew of the cystic duct -Has a History biliary colic.  -Symptoms consistent w/ colic and described as a "crampy" pain -U/S findings and positive murphy sign suggestive of cholecystitis, though abdominal pain has resolved spontaneously. olabs sig for mildly elevated wbcs, t bili 1.6.  gen surg has seen, advising admission for abx, cholecystectomy -C/w Clear liquid diet for now and Diet advancement per  -C/w IV Zosyn per General Surgery Reccomendations -Continue to hold Apixiban -SCDs -Had a low-risk nuclear stress 4 months ago. No new ischemic symptoms. Risk of death per as risk calculator 0.1, lower than average. Normal systolic function on [id number] tte. No ischemic changes seen on EKG. Think further cardiac w/u prior to surgery not necessary. -C/w Pain control with Acetaminophen and C/w Methocarbamol 1000 mg po q6hprn -Continue to [employer]  -Surgery was done this morning for cholecystectomy and patient also had lysis of adhesions and wedge biopsy of the liver and oversew of the cystic duct -WBC is 13.2 -> 10.7 -> 10.6 -Further care per general surgery and diet advancement per them; will also need to have them resume anticoagulation when they feel it is stable to do so  Hyponatremia  -Was new and Mild (132) on admission -Etiology  possibly 2/2 pain, recent nausea/vomiting but likley is from HCTZ -Repeat Sodium this AM improved to 137 -Repeat CMP in AM   Hyperbilirubinemia  -In the Setting of Symptomatic Cholelithiasis -T Bili is now 1.4 and likely reactive and trended up to 1.8 and is now back down to 1.4 -  Continue to Monitor and Trend -Repeat CMP in AM   HTN  -Initial bp elevated, now wnl at 148/86 -Resumed home antihypertensives with Metoprolol Succinate 25 mg po Daily, and Losartan 100 mg po Daily -Will stop HCTZ given recent Hyponatremia and Hypochloremia   -Continue to monitor blood pressures per protocol  HLD -Continue home Atorvastatin 40 mg po Daily  Asthma, Moderate COPD with Chronic Bronchitis  Bronchiectasis without Complication  -Continue Albuterol 2.5 mg Neb q6hprn Wheezing/SOBand substitute formulary equivalent for home Symbicort with Dulera 2 puff IH BID -C/w Guaifenesin-Dextromethorphan 10 mL po q4hprn Cough/Chest Congestion   Paroxysmal Atrial Fibrillation CAD -Currently in NSR and his CHA2DS2-VASc is 2 for Age and HTN History  -Currently Holding Apixaban as above, bridging anticoagulation does not appear to be indicated -C/w Metoprolol, Losartan, Atorvastatin, and currently holding anticoagulation and will need to resume as soon as able per general surgery recommendation  OSA -C/w CPAP  Overweight -Estimated body mass index is 27.31 kg/m as calculated from the following:   Height as of this encounter: 5' 9.02" (1.753 m).   Weight as of this encounter: 83.9 kg. -Weight Loss and Dietary Counseling   GERD -C/w Pantoprazole 40 mg po Daily  DVT prophylaxis: SCDs as Eliquis is currently being held Code Status: FULL CODE  Family Communication: No family present at bedside  Disposition Plan: Pending further Surgical Intervention and Recc's  Consultants:   General Surgery   Procedures: None   Antimicrobials:  Anti-infectives (From admission, onward)   Start     Dose/Rate Route Frequency Ordered Stop   09/15/19 0715  cefTRIAXone (ROCEPHIN) 2 g in sodium chloride 0.9 % 100 mL IVPB     2 g 200 mL/hr over 30 Minutes Intravenous On call to O.R. 09/15/19 [id number] 09/15/19 0826   09/15/19 0715  metroNIDAZOLE (FLAGYL) IVPB 500 mg     500 mg 100 mL/hr over 60 Minutes Intravenous On call to O.R. 09/15/19 [id number] 09/15/19 0905   09/15/19 0654  sodium chloride 0.9 % with cefTRIAXone (ROCEPHIN) ADS Med    Note to Pharmacy: Elisha Headland  : cabinet override      09/15/19 0654 09/15/19 0756   09/14/19 0600  cefTRIAXone  (ROCEPHIN) 2 g in sodium chloride 0.9 % 100 mL IVPB  Status:  Discontinued     2 g 200 mL/hr over 30 Minutes Intravenous On call to O.R. 09/13/19 2016 09/13/19 2027   09/14/19 0600  metroNIDAZOLE (FLAGYL) IVPB 500 mg  Status:  Discontinued     500 mg 100 mL/hr over 60 Minutes Intravenous On call to O.R. 09/13/19 2016 09/13/19 2027   09/13/19 2200  piperacillin-tazobactam (ZOSYN) IVPB 3.375 g     3.375 g 12.5 mL/hr over 240 Minutes Intravenous Every 8 hours 09/13/19 2016       Subjective: Seen and examined at bedside and had just come back from a surgical intervention and was feeling well.  Denies any pain currently.  No nausea or vomiting.  No other concerns or complaints at this time and hoping to go home first thing in the morning.  Objective: Vitals:   09/15/19 1100 09/15/19 1115 09/15/19 1130 09/15/19 1204  BP: (!) 165/77 (!) 144/78 (!) 168/57 (!) 148/86  Pulse: (!) 48 (!) 56 (!) 53 62  Resp: (!) 8 12 12 14   Temp:    97.8 F (36.6 C)  TempSrc:    Oral  SpO2: 92% 100% 100% 98%  Weight:      Height:  Intake/Output Summary (Last 24 hours) at 09/15/2019 1308 Last data filed at 09/15/2019 1252 Gross per 24 hour  Intake 2217.6 ml  Output 2720 ml  Net -502.4 ml   Filed Weights   09/15/19 0803  Weight: 83.9 kg   Examination: Physical Exam:  Constitutional: WN/WD overweight Caucasian male currently in no acute distress appears calm  Eyes: Lids and conjunctivae normal, sclerae anicteric  ENMT: External Ears, Nose appear normal. Grossly normal hearing. Mucous membranes are moist.  Neck: Appears normal, supple, no cervical masses, normal ROM, no appreciable thyromegaly; no JVD Respiratory: Diminished to auscultation bilaterally, no wheezing, rales, rhonchi or crackles. Normal respiratory effort and patient is not tachypenic. No accessory muscle use.   Cardiovascular: RRR, no murmurs / rubs / gallops. S1 and S2 auscultated. No extremity edema.  Abdomen: Soft, non-tender,  mildly-distended.  Has 2 abdominal incisions appear clean dry intact. Bowel sounds positive x4.  GU: Deferred. Musculoskeletal: No clubbing / cyanosis of digits/nails. No joint deformity upper and lower extremities.  Skin: No rashes, lesions, ulcers on a limited skin evaluation. No induration; Warm and dry.  Neurologic: CN 2-12 grossly intact with no focal deficits. Romberg sign and cerebellar reflexes not assessed.  Psychiatric: Normal judgment and insight. Alert and oriented x 3. Normal mood and appropriate affect.   Data Reviewed: I have personally reviewed following labs and imaging studies  CBC: Recent Labs  Lab 09/13/19 1318 09/14/19 0336 09/15/19 0307  WBC 13.2* 10.7* 10.6*  NEUTROABS  --  7.5 6.8  HGB 15.3 15.3 14.3  HCT 46.2 46.7 44.4  MCV 94.3 94.7 95.5  PLT 256 254 [id number]   Basic Metabolic Panel: Recent Labs  Lab 09/13/19 1318 09/14/19 0336 09/15/19 0307  NA 132* 137 137  K 4.0 3.5 4.0  CL 96* 104 105  CO2 27 22 23   GLUCOSE 114* 92 89  BUN 23 15 12   CREATININE 0.82 0.71 0.93  CALCIUM 9.1 8.9 8.9  MG  --   --  2.1  PHOS  --   --  3.0   GFR: Estimated Creatinine Clearance: 69.7 mL/min (by C-G formula based on SCr of 0.93 mg/dL). Liver Function Tests: Recent Labs  Lab 09/13/19 1730 09/14/19 0336 09/15/19 0307  AST 23 22 18   ALT 27 26 24   ALKPHOS 45 46 39  BILITOT 1.6* 1.8* 1.4*  PROT 6.7 6.3* 5.6*  ALBUMIN 3.8 3.6 3.3*   Recent Labs  Lab 09/13/19 1730  LIPASE 36   No results for input(s): AMMONIA in the last 168 hours. Coagulation Profile: No results for input(s): INR, PROTIME in the last 168 hours. Cardiac Enzymes: No results for input(s): CKTOTAL, CKMB, CKMBINDEX, TROPONINI in the last 168 hours. BNP (last 3 results) No results for input(s): PROBNP in the last 8760 hours. HbA1C: No results for input(s): HGBA1C in the last 72 hours. CBG: No results for input(s): GLUCAP in the last 168 hours. Lipid Profile: No results for input(s): CHOL,  HDL, LDLCALC, TRIG, CHOLHDL, LDLDIRECT in the last 72 hours. Thyroid Function Tests: No results for input(s): TSH, T4TOTAL, FREET4, T3FREE, THYROIDAB in the last 72 hours. Anemia Panel: No results for input(s): VITAMINB12, FOLATE, FERRITIN, TIBC, IRON, RETICCTPCT in the last 72 hours. Sepsis Labs: No results for input(s): PROCALCITON, LATICACIDVEN in the last 168 hours.  Recent Results (from the past 240 hour(s))  SARS Coronavirus 2 [facility] order, Performed in [facility] hospital lab) Nasopharyngeal Nasopharyngeal Swab     Status: None   Collection Time: 09/13/19  8:14  PM   Specimen: Nasopharyngeal Swab  Result Value Ref Range Status   SARS Coronavirus 2 NEGATIVE NEGATIVE Final    Comment: (NOTE) If result is NEGATIVE SARS-CoV-2 target nucleic acids are NOT DETECTED. The SARS-CoV-2 RNA is generally detectable in upper and lower  respiratory specimens during the acute phase of infection. The lowest  concentration of SARS-CoV-2 viral copies this assay can detect is 250  copies / mL. A negative result does not preclude SARS-CoV-2 infection  and should not be used as the sole basis for treatment or other  patient management decisions.  A negative result may occur with  improper specimen collection / handling, submission of specimen other  than nasopharyngeal swab, presence of viral mutation(s) within the  areas targeted by this assay, and inadequate number of viral copies  (<250 copies / mL). A negative result must be combined with clinical  observations, patient history, and epidemiological information. If result is POSITIVE SARS-CoV-2 target nucleic acids are DETECTED. The SARS-CoV-2 RNA is generally detectable in upper and lower  respiratory specimens dur ing the acute phase of infection.  Positive  results are indicative of active infection with SARS-CoV-2.  Clinical  correlation with patient history and other diagnostic information is  necessary to determine patient infection  status.  Positive results do  not rule out bacterial infection or co-infection with other viruses. If result is PRESUMPTIVE POSTIVE SARS-CoV-2 nucleic acids MAY BE PRESENT.   A presumptive positive result was obtained on the submitted specimen  and confirmed on repeat testing.  While 2019 novel coronavirus  (SARS-CoV-2) nucleic acids may be present in the submitted sample  additional confirmatory testing may be necessary for epidemiological  and / or clinical management purposes  to differentiate between  SARS-CoV-2 and other Sarbecovirus currently known to infect humans.  If clinically indicated additional testing with an alternate test  methodology [phone]) is advised. The SARS-CoV-2 RNA is generally  detectable in upper and lower respiratory sp ecimens during the acute  phase of infection. The expected result is Negative. Fact Sheet for Patients:  StrictlyIdeas.no Fact Sheet for Healthcare Providers: BankingDealers.co.za This test is not yet approved or cleared by the [place] FDA and has been authorized for detection and/or diagnosis of SARS-CoV-2 by FDA under an Emergency Use Authorization (EUA).  This EUA will remain in effect (meaning this test can be used) for the duration of the COVID-19 declaration under Section 564(b)(1) of the Act, 21 U.S.C. section 360bbb-3(b)(1), unless the authorization is terminated or revoked sooner. Performed at [facility], [place] [address] [place], [place] [address]   Surgical pcr screen     Status: None   Collection Time: 09/14/19  1:47 PM   Specimen: Nasal Mucosa; Nasal Swab  Result Value Ref Range Status   MRSA, PCR NEGATIVE NEGATIVE Final   Staphylococcus aureus NEGATIVE NEGATIVE Final    Comment: (NOTE) The Xpert SA Assay (FDA approved for NASAL specimens in patients 72 years of age and older), is one component of a comprehensive surveillance program. It is not  intended to diagnose infection nor to guide or monitor treatment. Performed at [facility], [place] [address], [place], [place] [address]      Radiology Studies: Dg Chest 2 View  Result Date: 09/13/2019 CLINICAL DATA:  Chest pain EXAM: CHEST - 2 VIEW COMPARISON:  06/13/2018 FINDINGS: The heart size and mediastinal contours are stable. Chronic scarring and bronchiectasis within the bilateral lung bases, not significantly changed from prior. No new  focal airspace consolidation. No pleural effusion. No pneumothorax. The visualized skeletal structures are unremarkable. IMPRESSION: Chronic bibasilar scarring and bronchiectasis. No new/acute cardiopulmonary findings. Electronically Signed   By: Davina Poke M.D.   On: 09/13/2019 13:55   US Abdomen Limited Ruq  Result Date: 09/13/2019 CLINICAL DATA:  Right upper quadrant pain. EXAM: ULTRASOUND ABDOMEN LIMITED RIGHT UPPER QUADRANT COMPARISON:  None. FINDINGS: Gallbladder: The gallbladder wall measures up to 1 cm in thickness. Multiple stones are seen in the gallbladder. A positive Murphy's sign is reported. The results sheet was not marked regarding a Murphy's sign but I did speak to the sonographer. No pericholecystic fluid. Common bile duct: Diameter: 5 mm Liver: Increased echogenicity in the liver with no focal mass identified. Portal vein is patent on color Doppler imaging with normal direction of blood flow towards the liver. Other: None. IMPRESSION: 1. Gallbladder wall thickening, stones, and a positive Murphy's sign are most consistent with acute cholecystitis in the appropriate clinical setting. 2. Probable hepatic steatosis. Electronically Signed   By: Dorise Bullion III M.D   On: 09/13/2019 18:25   Scheduled Meds: . atorvastatin  40 mg Oral Daily  . fentaNYL      . lip balm  1 application Topical BID  . losartan  100 mg Oral Daily  . metoprolol succinate  25 mg Oral Daily  . mometasone-formoterol  2 puff Inhalation BID   . pantoprazole  40 mg Oral Daily  . psyllium  1 packet Oral Daily  . sodium chloride flush  3 mL Intravenous Q12H  . sodium chloride flush  3 mL Intravenous Q12H   Continuous Infusions: . sodium chloride    . sodium chloride    . lactated ringers    . methocarbamol (ROBAXIN) IV    . piperacillin-tazobactam (ZOSYN)  IV 3.375 g (09/15/19 0509)    LOS: 2 days   Kerney Elbe, DO [facility] PAGER is on [place]  If 7PM-7AM, please contact night-coverage [email or web address] Password [email or web address] 09/15/2019, 1:08 PM

## 2019-09-15 NOTE — Anesthesia Preprocedure Evaluation (Addendum)
Anesthesia Evaluation  Patient identified by MRN, date of birth, ID band Patient awake    Reviewed: Allergy & Precautions, NPO status , Patient's Chart, lab work & pertinent test results  History of Anesthesia Complications Negative for: history of anesthetic complications  Airway Mallampati: II  TM Distance: >3 FB Neck ROM: Full    Dental  (+) Teeth Intact   Pulmonary asthma , sleep apnea , COPD, former smoker,    Pulmonary exam normal        Cardiovascular hypertension, Pt. on medications and Pt. on home beta blockers Normal cardiovascular exam+ dysrhythmias Atrial Fibrillation      Neuro/Psych negative neurological ROS  negative psych ROS   GI/Hepatic Neg liver ROS, hiatal hernia, GERD  ,  Endo/Other  negative endocrine ROS  Renal/GU negative Renal ROS  negative genitourinary   Musculoskeletal negative musculoskeletal ROS (+)   Abdominal   Peds  Hematology negative hematology ROS (+)   Anesthesia Other Findings Acute cholecystitis  Echo 2016: EF 55-60%, normal valves Stress test 05/15/19: low risk study, no ischemia, EF 45-54%  Reproductive/Obstetrics                            Anesthesia Physical Anesthesia Plan  ASA: III  Anesthesia Plan: General   Post-op Pain Management:    Induction: Intravenous  PONV Risk Score and Plan: 4 or greater and Ondansetron, Dexamethasone, Treatment may vary due to age or medical condition and Midazolam  Airway Management Planned: Oral ETT  Additional Equipment: None  Intra-op Plan:   Post-operative Plan: Extubation in OR  Informed Consent: I have reviewed the patients History and Physical, chart, labs and discussed the procedure including the risks, benefits and alternatives for the proposed anesthesia with the patient or authorized representative who has indicated his/her understanding and acceptance.     Dental advisory given  Plan  Discussed with:   Anesthesia Plan Comments:        Anesthesia Quick Evaluation

## 2019-09-15 NOTE — Plan of Care (Signed)
Patient back to room from PACU; pain and nausea controlled. No needs expressed. Will continue to monitor.

## 2019-09-15 NOTE — Op Note (Signed)
09/15/2019  PATIENT:  Mario [name]  73 y.o. male  Patient Care Team: [name], MD as PCP - General (Family Medicine) Tanda Rockers, MD as Consulting Physician (Pulmonary Disease) [name], MD as Consulting Physician (Cardiology) Milus Banister, MD as Consulting Physician (Gastroenterology)  PRE-OPERATIVE DIAGNOSIS:    Acute on Chronic Calculus Cholecystitis  POST-OPERATIVE DIAGNOSIS:   Acute on Chronic Calculus Cholecystitis  PROCEDURE:  Laparoscopic lysis of adhesions Laparoscopic cholecystectomy Wedge biopsy of liver Oversew of cystic duct  SURGEON:  Adin Hector, MD, FACS.  ASSISTANT: OR Staff   ANESTHESIA:    General with endotracheal intubation Local anesthetic as a field block  EBL:  (See Anesthesia Intraoperative Record) Total I/O In: 700 [I.V.:700] Out: 20 [Blood:20]  Delay start of Pharmacological VTE agent (>24hrs) due to surgical blood loss or risk of bleeding:  no  DRAINS: None   SPECIMEN: Gallbladder and wedge of liver    DISPOSITION OF SPECIMEN:  PATHOLOGY  COUNTS:  YES  PLAN OF CARE: Admit to inpatient   PATIENT DISPOSITION:  PACU - hemodynamically stable.  INDICATION: Underlying overweight gentleman with history of recurrent biliary colic and known chronic cholecystitis.  Had episode of severe pain and nausea and vomiting.  Concern for possible heart attack.  That was ruled out.  Concern for hiatal hernia issues but is been stable on his proton pump inhibitor.  Ultrasound suspicious for cholecystitis.  Recommendation made for admission, IV antibiotics, medical clearance, hold anticoagulation.  Plan cholecystectomy.  He is feeling better and ready to proceed  The anatomy & physiology of hepatobiliary & pancreatic function was discussed.  The pathophysiology of gallbladder dysfunction was discussed.  Natural history risks without surgery was discussed.   I feel the risks of no intervention will lead to serious problems that  outweigh the operative risks; therefore, I recommended cholecystectomy to remove the pathology.  I explained laparoscopic techniques with possible need for an open approach.  Probable cholangiogram to evaluate the bilary tract was explained as well.    Risks such as bleeding, infection, abscess, leak, injury to other organs, need for further treatment, heart attack, death, and other risks were discussed.  I noted a good likelihood this will help address the problem.  Possibility that this will not correct all abdominal symptoms was explained.  Goals of post-operative recovery were discussed as well.  We will work to minimize complications.  An educational handout further explaining the pathology and treatment options was given as well.  Questions were answered.  The patient expresses understanding & wishes to proceed with surgery.  OR FINDINGS: Very dense omental and duodenal adhesions to his gallbladder consistent with chronic cholecystitis.  Acute inflammation and clear colorless mucousy replacement of the gallbladder suspicious for chronic acalculous cholecystitis.  Very foreshortened cystic duct with very poor quality.  Liver somewhat friable mildly suspicious for fatty change.  Wedge biopsy done.  DESCRIPTION:   The patient was identified & brought in the operating room. The patient was positioned supine with arms tucked. SCDs were active during the entire case. The patient underwent general anesthesia without any difficulty.  The abdomen was prepped and draped in a sterile fashion. A Surgical Timeout confirmed our plan.  I made a transverse curvilinear incision through the superior umbilical fold.  I placed a 45mm long port through the supraumbilical fascia using a modified Hassan cutdown technique with umbilical stalk fascial countertraction. I began carbon dioxide insufflation.  No change in end tidal CO2 measurement.  Camera inspection revealed no injury. There were no adhesions to the anterior  abdominal wall supraumbilically.  I proceeded to continue with single site technique. I placed a #5 port in left upper aspect of the wound. I placed a 5 mm atraumatic grasper in the right inferior aspect of the wound.  I turned attention to the right upper quadrant.  There were dense adhesions to the liver.  Carefully freed the greater omental adhesions with hydrated dissection as well as ultrasonic dissection to expose the gallbladder fundus the gallbladder fundus was elevated cephalad. I freed adhesions to the ventral surface of the gallbladder off carefully.  This is primarily of greater omentum.  I could see the transverse colon it was far away.  Could see the duodenal bulb adherent to the mid gallbladder although those adhesions were wispy and not as inflammatory.  This better expose the gallbladder so could be elevated better.  I freed the peritoneal coverings between the gallbladder and the liver on the posteriolateral and anteriomedial walls. I alternated between Harmonic & blunt Maryland dissection to help get a good critical view of the cystic artery and cystic duct.  I did further dissection to free 80%of the gallbladder off the liver bed to get a good critical view of the infundibulum and cystic duct. I dissected out the cystic artery; and, after getting a good 360 view, ligated the anterior & posterior branches of the cystic artery close on the infundibulum using titanium clips and the Harmonic ultrasonic dissection.  I skeletonized the cystic duct.  It was rather foreshortened and inflamed with a dense lymph node of Calot.  I felt it was too inflamed to tolerate clips.  I freed the gallbladder from its the last few attachments on the apex of the gallbladder fossa at the dome of the gallbladder.  I used a 0 PDS Endoloop to lasso around entire gallbladder and infundibulum until it was at the infundibular/cystic duct stump.  As I brought that down to ligated it transected the infundibulum and the  gallbladder off the cystic stump.  While it looks like I got a decent ligation, I was concerned for possible falling off and breaking down.  I placed an extra port to help elevate the liver and better expose.  Therefore I used a 2-0 PDS suture and laparoscopically did figure-of-eight suturing over the cystic duct stump x2.  This provided better ligation.  Did copious irrigation assured hemostasis on the liver bed.  I did serial aliquots of several liters to confirm there was no leak of bile or other abnormality.  The common bile duct was in its natural retroperitoneal position and there is no evidence of any kinking or foreshortening or narrowing of it with those suturing's.  Because things cleaned up well, I did not leave a drain.  I did excise a tongue of liver that had a lingula for separate liver biopsy as well.  I assured hemostasis there.  I remove the wedge of liver out of the supraumbilical wound.  I placed the gallbladder inside an Endo Catch bag.  I did copious irrigation and assured hemostasis and no leaking of bile.  I removed the gallbladder out the supraumbilical fascia.  Had open up the fascial defect to 18 mm to get the gallbladder and the thickened wall out I closed the fascia transversely using #1 PDS interrupted stitches. I closed the skin using 4-0 monocryl stitch.  Sterile dressing was applied. The patient was extubated & arrived in the PACU in stable  condition..  I had discussed postoperative care with the patient in the holding area. I discussed operative findings, updated the patient's status, discussed probable steps to recovery, and gave postoperative recommendations to the patient's spouse.  Recommendations were made.  Questions were answered.  She expressed understanding & appreciation.  Adin Hector, M.D., F.A.C.S. Gastrointestinal and Minimally Invasive Surgery [facility] [address], [place], [place] [address] [phone] Main /  Paging  09/15/2019 9:59 AM

## 2019-09-16 ENCOUNTER — Encounter (HOSPITAL_COMMUNITY): Payer: Self-pay | Admitting: Surgery

## 2019-09-16 LAB — CBC WITH DIFFERENTIAL/PLATELET
Abs Immature Granulocytes: 0.13 10*3/uL — ABNORMAL HIGH (ref 0.00–0.07)
Basophils Absolute: 0 10*3/uL (ref 0.0–0.1)
Basophils Relative: 0 %
Eosinophils Absolute: 0 10*3/uL (ref 0.0–0.5)
Eosinophils Relative: 0 %
HCT: 41.7 % (ref 39.0–52.0)
Hemoglobin: 13.7 g/dL (ref 13.0–17.0)
Immature Granulocytes: 1 %
Lymphocytes Relative: 6 %
Lymphs Abs: 1.1 10*3/uL (ref 0.7–4.0)
MCH: 31.6 pg (ref 26.0–34.0)
MCHC: 32.9 g/dL (ref 30.0–36.0)
MCV: 96.3 fL (ref 80.0–100.0)
Monocytes Absolute: 1.3 10*3/uL — ABNORMAL HIGH (ref 0.1–1.0)
Monocytes Relative: 7 %
Neutro Abs: 16.7 10*3/uL — ABNORMAL HIGH (ref 1.7–7.7)
Neutrophils Relative %: 86 %
Platelets: 255 10*3/uL (ref 150–400)
RBC: 4.33 MIL/uL (ref 4.22–5.81)
RDW: 14.2 % (ref 11.5–15.5)
WBC: 19.1 10*3/uL — ABNORMAL HIGH (ref 4.0–10.5)
nRBC: 0 % (ref 0.0–0.2)

## 2019-09-16 LAB — COMPREHENSIVE METABOLIC PANEL
ALT: 71 U/L — ABNORMAL HIGH (ref 0–44)
AST: 54 U/L — ABNORMAL HIGH (ref 15–41)
Albumin: 3.4 g/dL — ABNORMAL LOW (ref 3.5–5.0)
Alkaline Phosphatase: 39 U/L (ref 38–126)
Anion gap: 10 (ref 5–15)
BUN: 15 mg/dL (ref 8–23)
CO2: 23 mmol/L (ref 22–32)
Calcium: 8.9 mg/dL (ref 8.9–10.3)
Chloride: 103 mmol/L (ref 98–111)
Creatinine, Ser: 1.18 mg/dL (ref 0.61–1.24)
GFR calc Af Amer: >60 mL/min (ref >60)
GFR calc non Af Amer: >60 mL/min (ref >60)
Glucose, Bld: 131 mg/dL — ABNORMAL HIGH (ref 70–99)
Potassium: 4.4 mmol/L (ref 3.5–5.1)
Sodium: 136 mmol/L (ref 135–145)
Total Bilirubin: 0.9 mg/dL (ref 0.3–1.2)
Total Protein: 5.8 g/dL — ABNORMAL LOW (ref 6.5–8.1)

## 2019-09-16 LAB — MAGNESIUM: Magnesium: 2 mg/dL (ref 1.7–2.4)

## 2019-09-16 LAB — PHOSPHORUS: Phosphorus: 3.5 mg/dL (ref 2.5–4.6)

## 2019-09-16 MED ORDER — SODIUM CHLORIDE 0.9 % IV SOLN: INTRAVENOUS | Status: DC

## 2019-09-16 MED ORDER — SENNA 8.6 MG PO TABS: 1.0000 | ORAL_TABLET | Freq: Every day | ORAL | 0 refills | Status: DC | PRN

## 2019-09-16 MED ORDER — OXYCODONE HCL 5 MG PO TABS: 5.0000 mg | ORAL_TABLET | ORAL | 0 refills | Status: DC | PRN

## 2019-09-16 NOTE — Discharge Summary (Signed)
Physician Discharge Summary  [name] [id number] DOB: July 30, 1946 DOA: 09/13/2019  PCP: [name], MD  Admit date: 09/13/2019 Discharge date: 09/16/2019  Admitted From: Home Disposition: Home  Recommendations for Outpatient Follow-up:  1. Follow up with PCP in 1-2 weeks 2. Follow up with General Surgery Dr. Johney Maine in 2-3 weeks 3. Please obtain CMP/CBC, Mag, Phos in one week 4. Please follow up on the following pending results: Wedge Biopsy Results  Home Health: No Equipment/Devices: None  Discharge Condition: Stable CODE STATUS: FULL CODE Diet recommendation: Heart Healthy Diet   Brief/Interim Summary: HPI per Dr. Laurey Arrow on 09/13/2019 Mario Lia Adamsis a 73 y.o.malewith medical history significant forsymptomatic cholelithiasis, OSA, HTN, asthma, paroxysmal a fib, who presents with above.  Yesterday evening an hour or so after a meal patient developed a painful sensation upper abdomen/lower chest that was bilateral. Similar to previous gerd exacerbations. But stronger and lasted longer than normal. Had decreased appetite and 2 episodes of nbnb emesis. No diarrhea or constipation. No fevers. No cough. Not exertional, denies history of exertional chest pain. Did not radiate to back. Compliant with home medications. No known covid contacts. No history PE/DVT. Denies toxic habits. No dysuria or hematuria or increased urinary frequency. UTD colon cancer screening.   History biliary colic.  **Interim History   Patient abdominal pain has improved significantly and he states that it is a 0 out of 10.  IV antibiotics with Zosyn was being continued but now stopped and patient underwent surgical intervention today and was found to have acute on chronic calculus cholecystitis.  Patient also had a wedge biopsy done of his liver given suspicious findings for fatty liver changes.  Patient improved and tolerated his diet and was deemed stable to be discharged home and follow-up  with PCP and general surgery in outpatient setting  Discharge Diagnoses:  Principal Problem:   Acute calculous cholecystitis Active Problems:   Essential hypertension   Obstructive sleep apnea   Hyperlipidemia   COPD (chronic obstructive pulmonary disease) with chronic bronchitis (HCC)   Paroxysmal atrial fibrillation (HCC)   Asthma, chronic, moderate persistent vs ACOS in former smoker    Coronary artery calcification seen on CAT scan   Chronic anticoagulation   Hiatal hernia   Personal history of colonic polyps   Diverticulosis of sigmoid colon   Fatty liver   Gastric reflux   Cholecystitis  Symptomatic Biliary Colic, possible Acute on Chronic Calculus cholecystitis status post laparoscopic cholecystectomy, lysis of adhesions and wedge biopsy of the liver as well as oversew of the cystic duct postoperative day 1 -Has a History biliary colic.  -Symptoms consistent w/ colic and described as a "crampy" pain -U/S findings and positive murphy sign suggestive of cholecystitis, though abdominal pain has resolved spontaneously. olabs sig for mildly elevated wbcs, t bili 1.6. gen surg has seen, advising admission for abx, cholecystectomy -C/w Clear liquid diet for now and Diet advancement per  -C/w IV Zosyn per General Surgery Reccomendations -Continue to hold Apixiban -SCDs -Had a low-risk nuclear stress 4 months ago. No new ischemic symptoms. Risk of death per as risk calculator 0.1, lower than average. Normal systolic function on [id number] tte. No ischemic changes seen on EKG. Think further cardiac w/u prior to surgery not necessary. -C/w Pain control with Acetaminophen and C/w Methocarbamol 1000 mg po q6hprn -Continue to [employer]  -Surgery was done this morning for cholecystectomy and patient also had lysis of adhesions and wedge biopsy of the liver and oversew of the  cystic duct -WBC is 13.2 -> 10.7 -> 10.6 -> 19.1 and in the setting of Surgery  -Further care per general surgery and diet  advancement per them; will also need to have them resume anticoagulation when they feel it is stable to do so -Repeat CBC to evaluate Leukocytosis as an outpatient  -Patient has minimal abdominal pain is improved.  Deemed stable for discharge and will need to follow-up with PCP PA as well as general surgery in outpatient setting given that he is tolerating a diet now  Hyponatremia  -Was new and Mild (132) on admission -Etiology  possibly 2/2 pain, recent nausea/vomiting but likley is from HCTZ -Repeat Sodium this AM improved to 136 -Repeat CMP as an outpatient   Hyperbilirubinemia  -In the Setting of Symptomatic Cholelithiasis -T Bili is now 1.4 and likely reactive and trended up to 1.8 and is now back down to 0.9 -Continue to Monitor and Trend -Repeat CMP as an outpatient   HTN  -Initial bp elevated, now wnl at 127/69 -Resumed home antihypertensives with Metoprolol Succinate 25 mg po Daily, and Losartan 100 mg po Daily -Will stop HCTZ given recent Hyponatremia and Hypochloremia at D/C  -Continue to monitor blood pressures per protocol  HLD -Continue home Atorvastatin 40 mg po Daily  Asthma, Moderate COPD with Chronic Bronchitis  Bronchiectasis without Complication  -Continue Albuterol 2.5 mg Neb q6hprn Wheezing/SOBand substitute formulary equivalent for home Symbicort with Dulera 2 puff IH BID -C/w Guaifenesin-Dextromethorphan 10 mL po q4hprn Cough/Chest Congestion  -Respiratory Status is Stable   Paroxysmal Atrial Fibrillation CAD -Currently in NSR and his CHA2DS2-VASc is 2 for Age and HTN History  -Currently Holding Apixaban as above, bridging anticoagulation does not appear to be indicated and will resume Apixaban  -C/w Metoprolol, Losartan, Atorvastatin, and currently holding anticoagulation and will need to resume as soon as able per general surgery recommendation -Follow up with PCP  OSA -C/w CPAP qHS  Overweight -Estimated body mass index is 27.31 kg/m as  calculated from the following:   Height as of this encounter: 5' 9.02" (1.753 m).   Weight as of this encounter: 83.9 kg. -Weight Loss and Dietary Counseling   GERD -C/w Pantoprazole 40 mg po Daily  Leukocytosis -Worsened in the setting of surgical intervention -Likely reactive -Stable for discharge per surgical perspective We will continue monitor and trend WBC and repeat CBC within 1 week to ensure that it is down trending  Abnormal LFTs Likely expected to rise in the setting of surgical intervention -Patient's AST is now 54 and ALT is 71 -Continue to monitor and trend in the outpatient setting and repeat CMP -If continues to be consistently elevated will need further outpatient work-up with an abdominal ultrasound and acute hepatitis panel  Discharge Instructions Discharge Instructions    Call MD for:  difficulty breathing, headache or visual disturbances   Complete by: As directed    Call MD for:  extreme fatigue   Complete by: As directed    Call MD for:  hives   Complete by: As directed    Call MD for:  persistant dizziness or light-headedness   Complete by: As directed    Call MD for:  persistant nausea and vomiting   Complete by: As directed    Call MD for:  redness, tenderness, or signs of infection (pain, swelling, redness, odor or green/yellow discharge around incision site)   Complete by: As directed    Call MD for:  severe uncontrolled pain   Complete by:  As directed    Call MD for:  temperature >100.4   Complete by: As directed    Diet - low sodium heart healthy   Complete by: As directed    Discharge instructions   Complete by: As directed    You were cared for by a hospitalist during your hospital stay. If you have any questions about your discharge medications or the care you received while you were in the hospital after you are discharged, you can call the unit and ask to speak with the hospitalist on call if the hospitalist that took care of you is not  available. Once you are discharged, your primary care physician will handle any further medical issues. Please note that NO REFILLS for any discharge medications will be authorized once you are discharged, as it is imperative that you return to your primary care physician (or establish a relationship with a primary care physician if you do not have one) for your aftercare needs so that they can reassess your need for medications and monitor your lab values.  Follow up with PCP and General Surgery. Take all medications as prescribed. If symptoms change or worsen please return to the ED for evaluation   Increase activity slowly   Complete by: As directed      Allergies as of 09/16/2019   No Known Allergies     Medication List    STOP taking these medications   hydrochlorothiazide 25 MG tablet Commonly known as: HYDRODIURIL     TAKE these medications   albuterol (2.5 MG/3ML) 0.083% nebulizer solution Commonly known as: PROVENTIL Take 3 mLs (2.5 mg total) by nebulization every 6 (six) hours as needed for wheezing or shortness of breath. What changed: Another medication with the same name was changed. Make sure you understand how and when to take each.   ProAir HFA 108 (90 Base) MCG/ACT inhaler Generic drug: albuterol TAKE 2 PUFFS BY MOUTH EVERY 6 HOURS AS NEEDED FOR WHEEZE OR SHORTNESS OF BREATH What changed: See the new instructions.   apixaban 5 MG Tabs tablet Commonly known as: Eliquis Take 1 tablet (5 mg total) by mouth 2 (two) times daily.   atorvastatin 40 MG tablet Commonly known as: LIPITOR Take 40 mg by mouth daily.   Co Q-10 100 MG Caps Take 1 capsule by mouth daily.   dextromethorphan-guaiFENesin 30-600 MG 12hr tablet Commonly known as: MUCINEX DM Take 1 tablet by mouth at bedtime as needed for cough.   FIBER COMPLETE PO Take 1 tablet by mouth daily.   Flutter Devi Use as directed   glucosamine-chondroitin 500-400 MG tablet Take 1 tablet by mouth 3 (three)  times daily.   losartan 100 MG tablet Commonly known as: COZAAR Take 1 tablet (100 mg total) by mouth daily.   metoprolol succinate 25 MG 24 hr tablet Commonly known as: TOPROL-XL TAKE 1 TABLET BY MOUTH EVERY DAY   montelukast 10 MG tablet Commonly known as: SINGULAIR TAKE 1 TABLET BY MOUTH EVERY DAY   oxyCODONE 5 MG immediate release tablet Commonly known as: Oxy IR/ROXICODONE Take 1-2 tablets (5-10 mg total) by mouth every 4 (four) hours as needed for moderate pain, severe pain or breakthrough pain.   pantoprazole 40 MG tablet Commonly known as: PROTONIX TAKE 1 TABLET BY MOUTH TWICE A DAY What changed: when to take this   senna 8.6 MG Tabs tablet Commonly known as: SENOKOT Take 1 tablet (8.6 mg total) by mouth daily as needed for mild constipation.   Symbicort  160-4.5 MCG/ACT inhaler Generic drug: budesonide-formoterol INHALE 2 PUFFS INTO THE LUNGS 2 TIMES DAILY. What changed:   See the new instructions.  Another medication with the same name was removed. Continue taking this medication, and follow the directions you see here.   therapeutic multivitamin-minerals tablet Take 1 tablet by mouth daily.   UNABLE TO FIND Med Name: clear lungs otc 4 x daily   Vitamin D3 125 MCG (5000 UT) Tabs Take by mouth.      Follow-up [facility], [place]. Call.   Specialty: General Surgery Why: we are working on a follow up appointment for you. Please call to see when your appointment is. Please arrive 20 minutes early to complete paperwork. please bring photo ID and insurance card.  Contact information: [address] [place] [phone]         No Known Allergies  Consultations:  General Surgery  Procedures/Studies: Dg Chest 2 View  Result Date: 09/13/2019 CLINICAL DATA:  Chest pain EXAM: CHEST - 2 VIEW COMPARISON:  06/13/2018 FINDINGS: The heart size and mediastinal contours are stable. Chronic  scarring and bronchiectasis within the bilateral lung bases, not significantly changed from prior. No new focal airspace consolidation. No pleural effusion. No pneumothorax. The visualized skeletal structures are unremarkable. IMPRESSION: Chronic bibasilar scarring and bronchiectasis. No new/acute cardiopulmonary findings. Electronically Signed   By: Davina Poke M.D.   On: 09/13/2019 13:55   US Abdomen Limited Ruq  Result Date: 09/13/2019 CLINICAL DATA:  Right upper quadrant pain. EXAM: ULTRASOUND ABDOMEN LIMITED RIGHT UPPER QUADRANT COMPARISON:  None. FINDINGS: Gallbladder: The gallbladder wall measures up to 1 cm in thickness. Multiple stones are seen in the gallbladder. A positive Murphy's sign is reported. The results sheet was not marked regarding a Murphy's sign but I did speak to the sonographer. No pericholecystic fluid. Common bile duct: Diameter: 5 mm Liver: Increased echogenicity in the liver with no focal mass identified. Portal vein is patent on color Doppler imaging with normal direction of blood flow towards the liver. Other: None. IMPRESSION: 1. Gallbladder wall thickening, stones, and a positive Murphy's sign are most consistent with acute cholecystitis in the appropriate clinical setting. 2. Probable hepatic steatosis. Electronically Signed   By: Dorise Bullion III M.D   On: 09/13/2019 18:25    Subjective: Seen and examined at bedside and he was doing well surgically.  Denies any chest pain, lightheadedness or dizziness but did have some abdominal soreness.  No nausea or vomiting tolerating his diet.  Deemed stable to be discharged home and will need to follow-up with PCP and general surgeon outpatient setting he is understandable and agreeable with the plan of care.  Discharge Exam: Vitals:   09/16/19 0804 09/16/19 0954  BP:  127/69  Pulse:  (!) 51  Resp:  16  Temp:  98.2 F (36.8 C)  SpO2: 96% 95%   Vitals:   09/15/19 2139 09/16/19 0644 09/16/19 0804 09/16/19 0954  BP:  130/68 126/68  127/69  Pulse: (!) 59 (!) 49  (!) 51  Resp: 18 18  16   Temp: 98.8 F (37.1 C) 97.8 F (36.6 C)  98.2 F (36.8 C)  TempSrc: Oral Oral  Oral  SpO2: 97% 94% 96% 95%  Weight:      Height:       General: Pt is alert, awake, not in acute distress Cardiovascular: RRR slightly on the slower side, S1/S2 +, no rubs, no gallops Respiratory: Diminished bilaterally,  no wheezing, no rhonchi Abdominal: Soft, Mildly tender to palpate, Distended , bowel sounds + Extremities: Trace edema, no cyanosis  The results of significant diagnostics from this hospitalization (including imaging, microbiology, ancillary and laboratory) are listed below for reference.    Microbiology: Recent Results (from the past 240 hour(s))  SARS Coronavirus 2 [facility] order, Performed in [facility] hospital lab) Nasopharyngeal Nasopharyngeal Swab     Status: None   Collection Time: 09/13/19  8:14 PM   Specimen: Nasopharyngeal Swab  Result Value Ref Range Status   SARS Coronavirus 2 NEGATIVE NEGATIVE Final    Comment: (NOTE) If result is NEGATIVE SARS-CoV-2 target nucleic acids are NOT DETECTED. The SARS-CoV-2 RNA is generally detectable in upper and lower  respiratory specimens during the acute phase of infection. The lowest  concentration of SARS-CoV-2 viral copies this assay can detect is 250  copies / mL. A negative result does not preclude SARS-CoV-2 infection  and should not be used as the sole basis for treatment or other  patient management decisions.  A negative result may occur with  improper specimen collection / handling, submission of specimen other  than nasopharyngeal swab, presence of viral mutation(s) within the  areas targeted by this assay, and inadequate number of viral copies  (<250 copies / mL). A negative result must be combined with clinical  observations, patient history, and epidemiological information. If result is POSITIVE SARS-CoV-2 target nucleic acids are DETECTED. The  SARS-CoV-2 RNA is generally detectable in upper and lower  respiratory specimens dur ing the acute phase of infection.  Positive  results are indicative of active infection with SARS-CoV-2.  Clinical  correlation with patient history and other diagnostic information is  necessary to determine patient infection status.  Positive results do  not rule out bacterial infection or co-infection with other viruses. If result is PRESUMPTIVE POSTIVE SARS-CoV-2 nucleic acids MAY BE PRESENT.   A presumptive positive result was obtained on the submitted specimen  and confirmed on repeat testing.  While 2019 novel coronavirus  (SARS-CoV-2) nucleic acids may be present in the submitted sample  additional confirmatory testing may be necessary for epidemiological  and / or clinical management purposes  to differentiate between  SARS-CoV-2 and other Sarbecovirus currently known to infect humans.  If clinically indicated additional testing with an alternate test  methodology [phone]) is advised. The SARS-CoV-2 RNA is generally  detectable in upper and lower respiratory sp ecimens during the acute  phase of infection. The expected result is Negative. Fact Sheet for Patients:  StrictlyIdeas.no Fact Sheet for Healthcare Providers: BankingDealers.co.za This test is not yet approved or cleared by the [place] FDA and has been authorized for detection and/or diagnosis of SARS-CoV-2 by FDA under an Emergency Use Authorization (EUA).  This EUA will remain in effect (meaning this test can be used) for the duration of the COVID-19 declaration under Section 564(b)(1) of the Act, 21 U.S.C. section 360bbb-3(b)(1), unless the authorization is terminated or revoked sooner. Performed at [facility], [place] [address] [place], [place] [address]   Surgical pcr screen     Status: None   Collection Time: 09/14/19  1:47 PM   Specimen: Nasal Mucosa;  Nasal Swab  Result Value Ref Range Status   MRSA, PCR NEGATIVE NEGATIVE Final   Staphylococcus aureus NEGATIVE NEGATIVE Final    Comment: (NOTE) The Xpert SA Assay (FDA approved for NASAL specimens in patients 79 years of age and older), is one component of a comprehensive surveillance program.  It is not intended to diagnose infection nor to guide or monitor treatment. Performed at [facility], [place] [address] [place], [place] [address]      Labs: BNP (last 3 results) No results for input(s): BNP in the last 8760 hours. Basic Metabolic Panel: Recent Labs  Lab 09/13/19 1318 09/14/19 0336 09/15/19 0307 09/16/19 0341  NA 132* 137 137 136  K 4.0 3.5 4.0 4.4  CL 96* 104 105 103  CO2 27 22 23 23   GLUCOSE 114* 92 89 131*  BUN 23 15 12 15   CREATININE 0.82 0.71 0.93 1.18  CALCIUM 9.1 8.9 8.9 8.9  MG  --   --  2.1 2.0  PHOS  --   --  3.0 3.5   Liver Function Tests: Recent Labs  Lab 09/13/19 1730 09/14/19 0336 09/15/19 0307 09/16/19 0341  AST 23 22 18  54*  ALT 27 26 24  71*  ALKPHOS 45 46 39 39  BILITOT 1.6* 1.8* 1.4* 0.9  PROT 6.7 6.3* 5.6* 5.8*  ALBUMIN 3.8 3.6 3.3* 3.4*   Recent Labs  Lab 09/13/19 1730  LIPASE 36   No results for input(s): AMMONIA in the last 168 hours. CBC: Recent Labs  Lab 09/13/19 1318 09/14/19 0336 09/15/19 0307 09/16/19 0341  WBC 13.2* 10.7* 10.6* 19.1*  NEUTROABS  --  7.5 6.8 16.7*  HGB 15.3 15.3 14.3 13.7  HCT 46.2 46.7 44.4 41.7  MCV 94.3 94.7 95.5 96.3  PLT 256 254 230 255   Cardiac Enzymes: No results for input(s): CKTOTAL, CKMB, CKMBINDEX, TROPONINI in the last 168 hours. BNP: Invalid input(s): POCBNP CBG: No results for input(s): GLUCAP in the last 168 hours. D-Dimer No results for input(s): DDIMER in the last 72 hours. Hgb A1c No results for input(s): HGBA1C in the last 72 hours. Lipid Profile No results for input(s): CHOL, HDL, LDLCALC, TRIG, CHOLHDL, LDLDIRECT in the last 72 hours. Thyroid  function studies No results for input(s): TSH, T4TOTAL, T3FREE, THYROIDAB in the last 72 hours.  Invalid input(s): FREET3 Anemia work up No results for input(s): VITAMINB12, FOLATE, FERRITIN, TIBC, IRON, RETICCTPCT in the last 72 hours. Urinalysis    Component Value Date/Time   COLORURINE YELLOW 12/12/2012 0908   APPEARANCEUR CLEAR 12/12/2012 0908   LABSPEC 1.013 12/12/2012 0908   PHURINE 7.5 12/12/2012 0908   GLUCOSEU NEGATIVE 12/12/2012 0908   HGBUR NEGATIVE 12/12/2012 0908   BILIRUBINUR NEGATIVE 12/12/2012 0908   KETONESUR NEGATIVE 12/12/2012 0908   PROTEINUR NEGATIVE 12/12/2012 0908   UROBILINOGEN 1.0 12/12/2012 0908   NITRITE NEGATIVE 12/12/2012 0908   LEUKOCYTESUR NEGATIVE 12/12/2012 0908   Sepsis Labs Invalid input(s): PROCALCITONIN,  WBC,  LACTICIDVEN Microbiology Recent Results (from the past 240 hour(s))  SARS Coronavirus 2 [facility] order, Performed in [facility] hospital lab) Nasopharyngeal Nasopharyngeal Swab     Status: None   Collection Time: 09/13/19  8:14 PM   Specimen: Nasopharyngeal Swab  Result Value Ref Range Status   SARS Coronavirus 2 NEGATIVE NEGATIVE Final    Comment: (NOTE) If result is NEGATIVE SARS-CoV-2 target nucleic acids are NOT DETECTED. The SARS-CoV-2 RNA is generally detectable in upper and lower  respiratory specimens during the acute phase of infection. The lowest  concentration of SARS-CoV-2 viral copies this assay can detect is 250  copies / mL. A negative result does not preclude SARS-CoV-2 infection  and should not be used as the sole basis for treatment or other  patient management decisions.  A negative result may occur with  improper specimen collection / handling, submission of specimen other  than nasopharyngeal swab, presence of viral mutation(s) within the  areas targeted by this assay, and inadequate number of viral copies  (<250 copies / mL). A negative result must be combined with clinical  observations, patient history,  and epidemiological information. If result is POSITIVE SARS-CoV-2 target nucleic acids are DETECTED. The SARS-CoV-2 RNA is generally detectable in upper and lower  respiratory specimens dur ing the acute phase of infection.  Positive  results are indicative of active infection with SARS-CoV-2.  Clinical  correlation with patient history and other diagnostic information is  necessary to determine patient infection status.  Positive results do  not rule out bacterial infection or co-infection with other viruses. If result is PRESUMPTIVE POSTIVE SARS-CoV-2 nucleic acids MAY BE PRESENT.   A presumptive positive result was obtained on the submitted specimen  and confirmed on repeat testing.  While 2019 novel coronavirus  (SARS-CoV-2) nucleic acids may be present in the submitted sample  additional confirmatory testing may be necessary for epidemiological  and / or clinical management purposes  to differentiate between  SARS-CoV-2 and other Sarbecovirus currently known to infect humans.  If clinically indicated additional testing with an alternate test  methodology [phone]) is advised. The SARS-CoV-2 RNA is generally  detectable in upper and lower respiratory sp ecimens during the acute  phase of infection. The expected result is Negative. Fact Sheet for Patients:  StrictlyIdeas.no Fact Sheet for Healthcare Providers: BankingDealers.co.za This test is not yet approved or cleared by the [place] FDA and has been authorized for detection and/or diagnosis of SARS-CoV-2 by FDA under an Emergency Use Authorization (EUA).  This EUA will remain in effect (meaning this test can be used) for the duration of the COVID-19 declaration under Section 564(b)(1) of the Act, 21 U.S.C. section 360bbb-3(b)(1), unless the authorization is terminated or revoked sooner. Performed at [facility], [place] [address], [place], [place]  [address]   Surgical pcr screen     Status: None   Collection Time: 09/14/19  1:47 PM   Specimen: Nasal Mucosa; Nasal Swab  Result Value Ref Range Status   MRSA, PCR NEGATIVE NEGATIVE Final   Staphylococcus aureus NEGATIVE NEGATIVE Final    Comment: (NOTE) The Xpert SA Assay (FDA approved for NASAL specimens in patients 45 years of age and older), is one component of a comprehensive surveillance program. It is not intended to diagnose infection nor to guide or monitor treatment. Performed at [facility], [place] [address] [place], [place] [address]    Time coordinating discharge: 35 minutes  SIGNED:  Kerney Elbe, DO [facility] 09/16/2019, 11:56 AM Pager is on [place]  If 7PM-7AM, please contact night-coverage [email or web address] Password [email or web address]

## 2019-09-16 NOTE — Discharge Instructions (Signed)
POST OP INSTRUCTIONS  ######################################################################  EAT Gradually transition to a high fiber diet with a fiber supplement over the next few weeks after discharge.  Start with a pureed / full liquid diet (see below)  WALK Walk an hour a day.  Control your pain to do that.    CONTROL PAIN Control pain so that you can walk, sleep, tolerate sneezing/coughing, and go up/down stairs.  HAVE A BOWEL MOVEMENT DAILY Keep your bowels regular to avoid problems.  OK to try a laxative to override constipation.  OK to use an antidairrheal to slow down diarrhea.  Call if not better after 2 tries  CALL IF YOU HAVE PROBLEMS/CONCERNS Call if you are still struggling despite following these instructions. Call if you have concerns not answered by these instructions  ######################################################################    1. DIET: Follow a light bland diet & liquids the first 24 hours after arrival home, such as soup, liquids, starches, etc.  Be sure to drink plenty of fluids.  Quickly advance to a usual solid diet within a few days.  Avoid fast food or heavy meals as your are more likely to get nauseated or have irregular bowels.  A low-fat, high-fiber diet for the rest of your life is ideal.   2. Take your usually prescribed home medications unless otherwise directed.  3. PAIN CONTROL: a. Pain is best controlled by a usual combination of three different methods TOGETHER: i. Ice/Heat ii. Over the counter pain medication iii. Prescription pain medication b. Most patients will experience some swelling and bruising around the hernia(s) such as the bellybutton, groins, or old incisions.  Ice packs or heating pads (30-60 minutes up to 6 times a day) will help. Use ice for the first few days to help decrease swelling and bruising, then switch to heat to help relax tight/sore spots and speed recovery.  Some people prefer to use ice alone, heat alone,  alternating between ice & heat.  Experiment to what works for you.  Swelling and bruising can take several weeks to resolve.   c. It is helpful to take an over-the-counter pain medication regularly for the first few weeks.  Choose one of the following that works best for you: i. Naproxen (Aleve, etc)  Two 220mg  tabs twice a day ii. Ibuprofen (Advil, etc) Three 200mg  tabs four times a day (every meal & bedtime) iii. Acetaminophen (Tylenol, etc) 325-650mg  four times a day (every meal & bedtime) d. A  prescription for pain medication should be given to you upon discharge.  Take your pain medication as prescribed.  i. If you are having problems/concerns with the prescription medicine (does not control pain, nausea, vomiting, rash, itching, etc), please call us [phone] to see if we need to switch you to a different pain medicine that will work better for you and/or control your side effect better. ii. If you need a refill on your pain medication, please contact your pharmacy.  They will contact our office to request authorization. Prescriptions will not be filled after 5 pm or on week-ends.  4. Avoid getting constipated.  Between the surgery and the pain medications, it is common to experience some constipation.  Increasing fluid intake and taking a fiber supplement (such as Metamucil, Citrucel, FiberCon, MiraLax, etc) 1-2 times a day regularly will usually help prevent this problem from occurring.  A mild laxative (prune juice, Milk of Magnesia, MiraLax, etc) should be taken according to package directions if there are no bowel movements after 48 hours.  5. Wash / shower every day.  You may shower over the dressings as they are waterproof.    6. Remove your waterproof bandages, skin tapes, and other bandages 5 days after surgery. You may replace a dressing/Band-Aid to cover the incision for comfort if you wish. You may leave the incisions open to air.  You may replace a dressing/Band-Aid to cover  an incision for comfort if you wish.  Continue to shower over incision(s) after the dressing is off.  7. ACTIVITIES as tolerated:   a. You may resume regular (light) daily activities beginning the next day--such as daily self-care, walking, climbing stairs--gradually increasing activities as tolerated.  Control your pain so that you can walk an hour a day.  If you can walk 30 minutes without difficulty, it is safe to try more intense activity such as jogging, treadmill, bicycling, low-impact aerobics, swimming, etc. b. Save the most intensive and strenuous activity for last such as sit-ups, heavy lifting, contact sports, etc  Refrain from any heavy lifting or straining until you are off narcotics for pain control.   c. DO NOT PUSH THROUGH PAIN.  Let pain be your guide: If it hurts to do something, don't do it.  Pain is your body warning you to avoid that activity for another week until the pain goes down. d. You may drive when you are no longer taking prescription pain medication, you can comfortably wear a seatbelt, and you can safely maneuver your car and apply brakes. e. Mario [name] may have sexual intercourse when it is comfortable.   8. FOLLOW UP in our office a. Please call [facility] at [phone] to set up an appointment to see your surgeon in the office for a follow-up appointment approximately 2-3 weeks after your surgery. b. Make sure that you call for this appointment the day you arrive home to insure a convenient appointment time.  9.  If you have disability of FMLA / Family leave forms, please bring the forms to the office for processing.  (do not give to your surgeon).  WHEN TO CALL us [phone]: 1. Poor pain control 2. Reactions / problems with new medications (rash/itching, nausea, etc)  3. Fever over 101.5 F (38.5 C) 4. Inability to urinate 5. Nausea and/or vomiting 6. Worsening swelling or bruising 7. Continued bleeding from incision. 8. Increased pain, redness, or drainage from  the incision   The clinic staff is available to answer your questions during regular business hours (8:30am-5pm).  Please dont hesitate to call and ask to speak to one of our nurses for clinical concerns.   If you have a medical emergency, go to the nearest emergency room or call 911.  A surgeon from [facility] is always on call at the hospitals in [facility], [place], [place], [place], [place]  [address] ?  [address] [address], [place], [place]   [address] MAIN: [phone] ? TOLL FREE: [phone] ? FAX: [phone] [email or web address]   Cholecystitis  Cholecystitis is inflammation of the gallbladder. It is often called a gallbladder attack. The gallbladder is a pear-shaped organ that lies beneath the liver on the right side of the body. The gallbladder stores bile, which is a fluid that helps the body digest fats. If bile builds up in your gallbladder, your gallbladder becomes inflamed. This condition may occur suddenly. Cholecystitis is a serious condition and requires treatment. What are the causes? The most common cause of this condition is gallstones. Gallstones  can block the tube (duct) that carries bile out of your gallbladder. This causes bile to build up. Other causes include:  Damage to the gallbladder due to a decrease in blood flow.  Infections in the bile ducts.  Scars or kinks in the bile ducts.  Tumors in the liver, pancreas, or gallbladder. What increases the risk? You are more likely to develop this condition if:  You have sickle cell disease.  You take birth control pills or use estrogen.  You have alcoholic liver disease.  You have liver cirrhosis.  You have your nutrition delivered through a vein (parenteral nutrition).  You are critically ill.  You do not eat or drink for a long time. This is also called "fasting."  You are obese.  You lose weight too fast.  You are  pregnant.  You have high levels of fat (triglycerides) in the blood.  You have pancreatitis. What are the signs or symptoms? Symptoms of this condition include:  Pain in the abdomen, especially in the upper right area of the abdomen.  Tenderness or bloating in the abdomen.  Nausea.  Vomiting.  Fever.  Chills. How is this diagnosed? This condition is diagnosed with a medical history and physical exam. You may also have other tests, including:  Imaging tests, such as: ? An ultrasound of the gallbladder. ? A CT scan of the abdomen. ? A gallbladder nuclear scan (HIDA scan). This scan allows your health care provider to see the bile moving from your liver to your gallbladder and on to your small intestine. ? MRI.  Blood tests, such as: ? A complete blood count. The white blood cell count may be higher than normal. ? Liver function tests. Certain types of gallstones cause some results to be higher than normal. How is this treated? Treatment may include:  Surgery to remove your gallbladder (cholecystectomy).  Antibiotic medicine, usually through an IV.  Fasting for a certain amount of time.  Giving IV fluids.  Medicine to treat pain or vomiting. Follow these instructions at home:  If you had surgery, follow instructions from your health care provider about home care after the procedure. Medicines   Take over-the-counter and prescription medicines only as told by your health care provider.  If you were prescribed an antibiotic medicine, take it as told by your health care provider. Do not stop taking the antibiotic even if you start to feel better. General instructions  Follow instructions from your health care provider about what to eat or drink. When you are allowed to eat, avoid eating or drinking anything that triggers your symptoms.  Do not lift anything that is heavier than 10 lb (4.5 kg), or the limit that you are told, until your health care provider says that  it is safe.  Do not use any products that contain nicotine or tobacco, such as cigarettes and e-cigarettes. If you need help quitting, ask your health care provider.  Keep all follow-up visits as told by your health care provider. This is important. Contact a health care provider if:  Your pain is not controlled with medicine.  You have a fever. Get help right away if:  Your pain moves to another part of your abdomen or to your back.  You continue to have symptoms or you develop new symptoms even with treatment. Summary  Cholecystitis is inflammation of the gallbladder.  The most common cause of this condition is gallstones. Gallstones can block the tube (duct) that carries bile out of your gallbladder.  Common symptoms are pain in the abdomen, nausea, vomiting, fever, and chills.  This condition is treated with surgery to remove the gallbladder, medicines, fasting, and IV fluids.  Follow your health care provider's instructions for eating and drinking. Avoid eating anything that triggers your symptoms. This information is not intended to replace advice given to you by your health care provider. Make sure you discuss any questions you have with your health care provider. Document Released: 12/05/2005 Document Revised: 04/13/2018 Document Reviewed: 04/13/2018 Elsevier Patient Education  2020 [employer].

## 2019-09-16 NOTE — Plan of Care (Signed)
Patient discharged home via wheelchair. All questions were answered and all needs met.

## 2019-09-16 NOTE — Progress Notes (Signed)
[facility]/Trauma Progress Note  1 Day Post-Op   Assessment/Plan  Asthma COPD HTN HLD GERD  Cholecystitis - S/p laparoscopic LOA, cholecystectomy, wedge biopsy of liver, Dr. gross, 9/27 - Patient is okay for discharge from our standpoint  FEN: Heart healthy VTE: SCD's, okay to resume his nightly dose of Eliquis tonight ID: No antibiotics currently, none needed at discharge Foley: None Follow up: [facility] 2 to 3 weeks, will place in AVS  DISPO: Patient is okay for discharge from a surgical standpoint.    LOS: 3 days    Subjective/CC:  Patient denies much abdominal pain postop.  He is tolerating a regular diet and having bowel function.  He denies nausea, vomiting, fever or chills.  He is concerned about his umbilical incision and is red.  Patient works at home for [employer].  We discussed return to work on Saturday and no heavy lifting.  Objective: Vital signs in last 24 hours: Temp:  [97.7 F (36.5 C)-98.8 F (37.1 C)] 98.2 F (36.8 C) (09/28 0954) Pulse Rate:  [48-81] 51 (09/28 0954) Resp:  [8-32] 16 (09/28 0954) BP: (126-168)/(57-87) 127/69 (09/28 0954) SpO2:  [91 %-100 %] 95 % (09/28 0954) Last BM Date: 09/16/19  Intake/Output from previous day: 09/27 0701 - 09/28 0700 In: 1049.1 [I.V.:870; IV Piggyback:179.1] Out: 370 [Urine:350; Blood:20] Intake/Output this shift: Total I/O In: 120 [P.O.:120] Out: -   PE: Gen:  Alert, NAD, pleasant, cooperative Pulm:  rate and effort normal, work of breathing is nonlabored Abd: Soft, obese, NT/ND, incisions C/D/I, umbilical incision with inferior nonblanching erythema and small blister.  This appears to be ecchymosis and not cellulitis.  No peritonitis Skin: no rashes noted, warm and dry   Anti-infectives: Anti-infectives (From admission, onward)   Start     Dose/Rate Route Frequency Ordered Stop   09/15/19 0715  cefTRIAXone (ROCEPHIN) 2 g in sodium chloride 0.9 % 100 mL IVPB     2 g 200 mL/hr over  30 Minutes Intravenous On call to O.R. 09/15/19 [id number] 09/15/19 0826   09/15/19 0715  metroNIDAZOLE (FLAGYL) IVPB 500 mg     500 mg 100 mL/hr over 60 Minutes Intravenous On call to O.R. 09/15/19 [id number] 09/15/19 0905   09/15/19 0654  sodium chloride 0.9 % with cefTRIAXone (ROCEPHIN) ADS Med    Note to Pharmacy: Elisha Headland  : cabinet override      09/15/19 0654 09/15/19 0756   09/14/19 0600  cefTRIAXone (ROCEPHIN) 2 g in sodium chloride 0.9 % 100 mL IVPB  Status:  Discontinued     2 g 200 mL/hr over 30 Minutes Intravenous On call to O.R. 09/13/19 2016 09/13/19 2027   09/14/19 0600  metroNIDAZOLE (FLAGYL) IVPB 500 mg  Status:  Discontinued     500 mg 100 mL/hr over 60 Minutes Intravenous On call to O.R. 09/13/19 2016 09/13/19 2027   09/13/19 2200  piperacillin-tazobactam (ZOSYN) IVPB 3.375 g     3.375 g 12.5 mL/hr over 240 Minutes Intravenous Every 8 hours 09/13/19 2016        Lab Results:  Recent Labs    09/15/19 0307 09/16/19 0341  WBC 10.6* 19.1*  HGB 14.3 13.7  HCT 44.4 41.7  PLT 230 255   BMET Recent Labs    09/15/19 0307 09/16/19 0341  NA 137 136  K 4.0 4.4  CL 105 103  CO2 23 23  GLUCOSE 89 131*  BUN 12 15  CREATININE 0.93 1.18  CALCIUM 8.9 8.9   PT/INR No results  for input(s): LABPROT, INR in the last 72 hours. CMP     Component Value Date/Time   NA 136 09/16/2019 0341   K 4.4 09/16/2019 0341   CL 103 09/16/2019 0341   CO2 23 09/16/2019 0341   GLUCOSE 131 (H) 09/16/2019 0341   BUN 15 09/16/2019 0341   CREATININE 1.18 09/16/2019 0341   CREATININE 0.96 10/05/2018 0817   CALCIUM 8.9 09/16/2019 0341   PROT 5.8 (L) 09/16/2019 0341   ALBUMIN 3.4 (L) 09/16/2019 0341   AST 54 (H) 09/16/2019 0341   ALT 71 (H) 09/16/2019 0341   ALKPHOS 39 09/16/2019 0341   BILITOT 0.9 09/16/2019 0341   GFRNONAA >60 09/16/2019 0341   GFRNONAA 78 10/05/2018 0817   GFRAA >60 09/16/2019 0341   GFRAA 91 10/05/2018 0817   Lipase     Component Value Date/Time   LIPASE 36  09/13/2019 1730    Studies/Results: No results found.   Kalman Drape, [facility] Surgery Pager [phone] Cristine Polio, & Friday 7:00am - 4:30pm Thursdays 7:00am -11:30am  Consults: [phone]

## 2019-09-17 ENCOUNTER — Other Ambulatory Visit: Payer: Self-pay | Admitting: Internal Medicine

## 2019-09-17 LAB — SURGICAL PATHOLOGY

## 2019-09-29 ENCOUNTER — Other Ambulatory Visit: Payer: Self-pay | Admitting: Family Medicine

## 2019-10-18 ENCOUNTER — Other Ambulatory Visit: Payer: Self-pay | Admitting: Family Medicine

## 2019-11-04 ENCOUNTER — Other Ambulatory Visit: Payer: Self-pay | Admitting: Family Medicine

## 2019-11-13 ENCOUNTER — Encounter: Payer: Self-pay | Admitting: Family Medicine

## 2019-11-13 ENCOUNTER — Ambulatory Visit (INDEPENDENT_AMBULATORY_CARE_PROVIDER_SITE_OTHER): Payer: Medicare Other | Admitting: Family Medicine

## 2019-11-13 VITALS — BP 112/64 | HR 60 | Temp 98.2°F | Resp 16 | Ht 69.0 in | Wt 194.0 lb

## 2019-11-13 DIAGNOSIS — Z87440 Personal history of urinary (tract) infections: Secondary | ICD-10-CM | POA: Diagnosis not present

## 2019-11-13 DIAGNOSIS — R300 Dysuria: Secondary | ICD-10-CM

## 2019-11-13 LAB — URINALYSIS, ROUTINE W REFLEX MICROSCOPIC
Bilirubin Urine: NEGATIVE
Glucose, UA: NEGATIVE
Hgb urine dipstick: NEGATIVE
Ketones, ur: NEGATIVE
Leukocytes,Ua: NEGATIVE
Nitrite: NEGATIVE
Protein, ur: NEGATIVE
Specific Gravity, Urine: 1.02 (ref 1.001–1.03)
pH: 7 (ref 5.0–8.0)

## 2019-11-13 MED ORDER — CIPROFLOXACIN HCL 500 MG PO TABS: 500.0000 mg | ORAL_TABLET | Freq: Two times a day (BID) | ORAL | 0 refills | Status: DC

## 2019-11-13 MED ORDER — ATORVASTATIN CALCIUM 40 MG PO TABS: 40.0000 mg | ORAL_TABLET | Freq: Every day | ORAL | 1 refills | Status: DC

## 2019-11-13 NOTE — Patient Instructions (Signed)
F/U as needed

## 2019-11-13 NOTE — Progress Notes (Signed)
   Subjective:    Patient ID: Mario [name], male    DOB: 04-01-1946, 73 y.o.   MRN: [id number]  Patient presents for Dysuria (x4 days- slight pain in lower abd, thinks it's his bladder)   Pt here with concern for UTI.  States that he has a history of this although it has been a couple of years.  He has had twinges of pain in his suprapubic bladder region for the past 4 to 5 days also some mild pressure with urination.  He is also noted his urine has been a little darker than usual but no odor under a burning sensation.  His bowel movements are normal.  No nausea vomiting no fever no URI symptoms.      Review Of Systems:  GEN- denies fatigue, fever, weight loss,weakness, recent illness HEENT- denies eye drainage, change in vision, nasal discharge, CVS- denies chest pain, palpitations RESP- denies SOB, cough, wheeze ABD- denies N/V, change in stools, abd pain GU- + dysuria, hematuria, dribbling, incontinence MSK- denies joint pain, muscle aches, injury Neuro- denies headache, dizziness, syncope, seizure activity       Objective:    BP 112/64   Pulse 60   Temp 98.2 F (36.8 C) (Temporal)   Resp 16   Ht 5\' 9"  (1.753 m)   Wt 194 lb (88 kg)   SpO2 93%   BMI 28.65 kg/m  GEN- NAD, alert and oriented x3 HEENT- PERRL, EOMI, non injected sclera, pink conjunctiva, MMM, oropharynx clear CVS- RRR, no murmur RESP-CTAB ABD-NABS,soft, mild tenderness to palpation in the suprapubic region no rebound no guarding no CVA tenderness EXT- No edema Pulses- Radial 2+        Assessment & Plan:      Problem List Items Addressed This Visit    None    Visit Diagnoses    Dysuria    -  Primary   UTI symptoms though urinalysis looks okay with his history and his age I will go ahead and start Cipro 500 twice daily send urine for culture.  No red flags on exam.   Relevant Orders   Urinalysis, Routine w reflex microscopic (Completed)   Urine Culture   History of UTI          Note: This  dictation was prepared with Dragon dictation along with smaller phrase technology. Any transcriptional errors that result from this process are unintentional.

## 2019-11-15 ENCOUNTER — Other Ambulatory Visit: Payer: Self-pay | Admitting: Family Medicine

## 2019-11-15 LAB — URINE CULTURE
MICRO NUMBER:: 1139237
Result:: NO GROWTH
SPECIMEN QUALITY:: ADEQUATE

## 2019-12-30 ENCOUNTER — Ambulatory Visit: Payer: Medicare Other | Admitting: Internal Medicine

## 2020-01-05 ENCOUNTER — Other Ambulatory Visit: Payer: Self-pay | Admitting: Cardiovascular Disease

## 2020-01-06 ENCOUNTER — Encounter: Payer: Self-pay | Admitting: Internal Medicine

## 2020-01-06 ENCOUNTER — Ambulatory Visit: Payer: Medicare Other | Admitting: Internal Medicine

## 2020-01-06 ENCOUNTER — Other Ambulatory Visit: Payer: Self-pay

## 2020-01-06 DIAGNOSIS — J479 Bronchiectasis, uncomplicated: Secondary | ICD-10-CM | POA: Diagnosis not present

## 2020-01-06 DIAGNOSIS — J4540 Moderate persistent asthma, uncomplicated: Secondary | ICD-10-CM | POA: Diagnosis not present

## 2020-01-06 MED ORDER — BREZTRI AEROSPHERE 160-9-4.8 MCG/ACT IN AERO: 2.0000 | INHALATION_SPRAY | Freq: Two times a day (BID) | RESPIRATORY_TRACT | 11 refills | Status: DC

## 2020-01-06 MED ORDER — PREDNISONE 10 MG PO TABS: ORAL_TABLET | ORAL | 0 refills | Status: DC

## 2020-01-06 MED ORDER — BREZTRI AEROSPHERE 160-9-4.8 MCG/ACT IN AERO: 2.0000 | INHALATION_SPRAY | Freq: Two times a day (BID) | RESPIRATORY_TRACT | 0 refills | Status: DC

## 2020-01-06 MED ORDER — AMOXICILLIN-POT CLAVULANATE 875-125 MG PO TABS: 1.0000 | ORAL_TABLET | Freq: Two times a day (BID) | ORAL | 0 refills | Status: AC

## 2020-01-06 NOTE — Progress Notes (Signed)
Mario [name], male    DOB: 1946/02/02, 74 y.o.   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 allergy in 2000 for placed on shots x maybe a year but no change in maint therapy rx which seemed to help but freq flares changed from symbicort/spiriva  to trelegy then much worse while on trelegy which was around 03/2018 with cough/ wheeze>  On his own switched back to symbicort/spiriva around 06/25/18 and referred to pulmonary clinic 07/05/2018 by Dr   Dennard Schaumann.    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 Pulmonary Consult    Referred by [name]. Pt c/o cough and SOB x 3 months. He is coughing up clear sputum, esp worse in the am. He uses proair 1-2 x daily on average and has neb with albuterol that he rarely uses.   has sensation "lungs full of congestion" but doesn't cough much up and what he does is clear  cpap helps at hs / sleeps  on R side and bed is flat/ on left side side down wheezing keeps him up even on cpap Last pred 10 days prior to OV  And not better whereas previously worked great. Finished the abx about the time of the last ct 06/26/18  rec Bronchiectasis =   you have scarring of your bronchial tubes which means that they don't function perfectly normally and mucus tends to pool in certain areas of your lung which can cause pneumonia and further scarring of your lung and bronchial tubes Whenever you develop cough congestion take mucinex or mucinex dm > these will help keep the mucus loose and flowing but  if your condition worsens you need to seek help immediately preferably here or somewhere inside the [facility] system to compare xrays ( worse = darker or bloody mucus or pain on breathing in)   Use the flutter valve as much as possible Protonix 40 mg Take 30- 60 min before your first and last meals of the day  GERD (REFLUX)    schedule sinus CT > pos > ENT eval req Please schedule a follow up office visit in 4 weeks, sooner if needed with PFTs on return    08/13/2018  f/u ov/Mario [name] re: chronic asthma vs ACOS vs obst bronchiectasis  Chief Complaint  Patient presents with  . Follow-up    Pt states he feels like he is only some better since last visit. Pt has c/o SOB with exertion, cough with clear mucus, occ. chest tightness.  Dyspnea:  MMRC1 = can walk nl pace, flat grade, can't hurry or go uphills or steps s sob   Cough: since 08/10/18 was better p ent rx, some clear mucus now   Sleeping: ok on 3 inch blocks SABA use: albuterol hs only  rec Plan A = Automatic = symbiocort 160 x 2 and spiriva x 2 pffs  first thing in am  then 12 hours later Symbiort x 160 mg Work on inhaler technique:  Plan B = Backup Only use your albuterol as a rescue medication Plan C = Crisis - only use your albuterol nebulizer if you first try Plan B and it fails to help > ok to use the nebulizer up to every 4 hours but if start needing it regularly call for immediate appointment     09/24/2018  f/u ov/Mario [name] re: chronic asthma vs acos vs obst bronchiectasis assoc with chronic rhinitis/sinusitis under ent care Chief Complaint  Patient presents with  . Follow-up    Cough has improved some. He states his breathing is doing well. He has used his albuterol inhaler x 1 since the last visit. He rarely uses neb.   Dyspnea:  Walks 30 min on a track  Better and better ex tol  Cough: better then worse x 2 weeks esp p stirs  But not using symb first thing  Sleeping: fine on 3 in blocks  SABA use: only used once as hfa/ not neb   02: no 02 rec Be sure to take the am dose dose of symbicort 160 x 2 first thing in am and leave off spiriva (think of it like high octane fuel)  For thick mucus/ cough / congestion > mucinex dm up to 1200 mg every 12 hours with glass of water and the flutter valve as much as possible  For nasty mucus >  Doxycyline 100 mg twice daily x 10 day course (refillable so keep on file)        06/27/2019  f/u ov/Mario [name] re: AB/ obst bronchiectasis flared x 4 d prior to [place]   Chief Complaint  Patient presents with  . Follow-up    Cough with green sputum- started on doxy 4 days ago. He is using his albuterol inhaler once per day on average and has not needed neb.   Dyspnea:  Walking 20 min interval training  Cough: acutely worse x 4 days s systemic symptoms Sleeping: 3 in bed blocks sleeping well  SABA use: once a day typically p supper in recliner  02: none rec Try symbicort 80 Take 2 puffs first thing in am and then another 2 puffs about 12 hours later.  only use your albuterol as a rescue medication    01/06/2020  f/u ov/Mario [name] re: obst bronchiectasis/ preferred higher dose symb Chief Complaint  Patient presents with  . Follow-up    Pt c/o green nasal d/c x 10 days and also some chest congestion. He has been coughing more- clear sputum.   Dyspnea:  Denies though not very active over winter Cough: when lies down esp on L side with wheeze/ cough/ congestion  Sleeping: immediately in recliner or L side down  SABA use: none 02: none    No obvious day to day or daytime variability or assoc  mucus plugs or hemoptysis or cp or chest tightness, subjective wheeze or overt sinus or hb symptoms.    . Also denies any obvious fluctuation of symptoms with weather or environmental changes or other aggravating or alleviating factors except as outlined above   No unusual exposure hx or h/o childhood pna/ asthma or knowledge of premature birth.  Current Allergies, Complete Past Medical History, Past  Surgical History, Family History, and Social History were reviewed in [employer] record.  ROS  The following are not active complaints unless bolded Hoarseness, sore throat, dysphagia, dental problems, itching, sneezing,  nasal congestion or discharge of excess mucus or purulent secretions, ear ache,   fever, chills, sweats, unintended  wt loss or wt gain, classically pleuritic or exertional cp,  orthopnea pnd or arm/hand swelling  or leg swelling, presyncope, palpitations, abdominal pain, anorexia, nausea, vomiting, diarrhea  or change in bowel habits or change in bladder habits, change in stools or change in urine, dysuria, hematuria,  rash, arthralgias, visual complaints, headache, numbness, weakness or ataxia or problems with walking or coordination,  change in mood or  memory.        Current Meds  Medication Sig  . albuterol (PROVENTIL) (2.5 MG/3ML) 0.083% nebulizer solution Take 3 mLs (2.5 mg total) by nebulization every 6 (six) hours as needed for wheezing or shortness of breath.  Marland Kitchen atorvastatin (LIPITOR) 40 MG tablet Take 1 tablet (40 mg total) by mouth daily.  . Cholecalciferol (VITAMIN D3) 125 MCG (5000 UT) TABS Take by mouth.  . Coenzyme Q10 (CO Q-10) 100 MG CAPS Take 1 capsule by mouth daily.  Marland Kitchen dextromethorphan-guaiFENesin (MUCINEX DM) 30-600 MG 12hr tablet Take 1 tablet by mouth at bedtime as needed for cough.  Marland Kitchen ELIQUIS 5 MG TABS tablet TAKE 1 TABLET BY MOUTH TWICE A DAY  . FIBER COMPLETE PO Take 1 tablet by mouth daily.  Marland Kitchen glucosamine-chondroitin 500-400 MG tablet Take 1 tablet by mouth 3 (three) times daily.  . hydrochlorothiazide (HYDRODIURIL) 25 MG tablet TAKE 1 TABLET BY MOUTH EVERY DAY  . losartan (COZAAR) 100 MG tablet TAKE 1 TABLET BY MOUTH EVERY DAY  . metoprolol succinate (TOPROL-XL) 25 MG 24 hr tablet TAKE 1 TABLET BY MOUTH EVERY DAY (Patient taking differently: Take 25 mg by mouth daily. )  . montelukast (SINGULAIR) 10 MG tablet TAKE 1 TABLET BY  MOUTH EVERY DAY (Patient taking differently: Take 10 mg by mouth daily. )  . pantoprazole (PROTONIX) 40 MG tablet TAKE 1 TABLET BY MOUTH TWICE A DAY (Patient taking differently: Take 40 mg by mouth daily. )  . PROAIR HFA 108 (90 Base) MCG/ACT inhaler TAKE 2 PUFFS BY MOUTH EVERY 6 HOURS AS NEEDED FOR WHEEZE OR SHORTNESS OF BREATH (Patient taking differently: Inhale 2 puffs into the lungs every 6 (six) hours as needed for wheezing or shortness of breath. )  . Respiratory Therapy Supplies (FLUTTER) DEVI Use as directed  . senna (SENOKOT) 8.6 MG TABS tablet Take 1 tablet (8.6 mg total) by mouth daily as needed for mild constipation.  . SYMBICORT 160-4.5 MCG/ACT inhaler INHALE 2 PUFFS INTO THE LUNGS 2 TIMES DAILY. (Patient taking differently: Inhale 2 puffs into the lungs 2 (two) times daily. )  . therapeutic multivitamin-minerals (THERAGRAN-M) tablet Take 1 tablet by mouth daily.  Marland Kitchen UNABLE TO FIND Med Name: clear lungs otc 4 x daily                Objective:       pleasant amb wm nad     06/27/2019         192  12/25/2018         205  09/24/2018       212  08/13/2018       218   07/05/18 224 lb (101.6 kg)  06/12/18 230 lb (104.3 kg)  05/25/18 233 lb (105.7 kg)     Vital signs reviewed - Note on arrival 02 sats  96% on RA     HEENT : pt wearing mask not removed for exam due to covid - 19 concerns.   NECK :  without JVD/Nodes/TM/ nl carotid upstrokes bilaterally   LUNGS: no acc muscle use,  Min barrel  contour chest wall with bilateral  Insp/exp rhonchi  and  without cough on insp or exp maneuvers and min  Hyperresonant  to  percussion bilaterally     CV:  RRR  no s3 or murmur or increase in P2, and no edema   ABD:  soft and nontender with pos end  insp Hoover's  in the supine position. No bruits or organomegaly appreciated, bowel sounds nl  MS:   Nl gait/  ext warm without deformities, calf tenderness, cyanosis or clubbing No obvious joint restrictions   SKIN: warm and dry without  lesions    NEURO:  alert, approp, nl sensorium with  no motor or cerebellar deficits apparent.             Assessment

## 2020-01-06 NOTE — Patient Instructions (Addendum)
Augmentin 875 mg take one pill twice daily  X 10 days - take at breakfast and supper with large glass of water.  It would help reduce the usual side effects (diarrhea and yeast infections) if you ate cultured yogurt at lunch.   Prednisone 10 mg take  4 each am x 2 days,   2 each am x 2 days,  1 each am x 2 days and stop    Breztri Take 2 puffs first thing in am and then another 2 puffs about 12 hours later.    Please schedule a follow up visit in 3 months but call sooner if needed for referral to allergy.

## 2020-01-07 ENCOUNTER — Encounter: Payer: Self-pay | Admitting: Internal Medicine

## 2020-01-07 NOTE — Assessment & Plan Note (Signed)
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  PFT's  08/13/2018  FEV1 1.85 (63 % ) ratio 62   p symb 160/spiriva  And saba   prior to study with DLCO  75/74c % corrects to 108  % for alv volume   - 09/24/2018   try off spiriva > no worse as of 12/25/2018  - 06/27/2019   try reduce symb to 80 2bid to reduce flares of purulent tracheobronchitis > preferred symb 160 - 01/06/2020  After extensive coaching inhaler device,  effectiveness =    90% and flaring on symb 160 > try change to breztri 2bid x 2 weeks and pred x 6 d  Advised: Advised:  formulary restrictions will be an ongoing challenge for the forseable future and I would be happy to pick an alternative if the pt will first  provide me a list of them -  pt  will need to return here for training for any new device that is required eg dpi vs hfa vs respimat.    In the meantime we can always provide samples so that the patient never runs out of any needed respiratory medications.    Pt informed of the seriousness of COVID 19 infection as a direct risk to lung health  and safey and to close contacts and should continue to wear a facemask in public and minimize exposure to public locations but especially avoid any area or activity where non-close contacts are not observing distancing or wearing an appropriate face mask.  I strongly recommended vaccine when offered.    >>> f/u @ 3 m   I performed device teaching  using a teach back technique which also  extended face to face time for this visit (see above)       Each maintenance medication was reviewed in detail including emphasizing most importantly the difference between maintenance and prns and under what circumstances the prns are to be triggered using an action plan format that is not reflected in the computer generated alphabetically organized AVS which I have not found useful in most  complex patients, especially with respiratory illnesses  Total time for H and P, chart review, counseling,  and generating AVS / charting =  20 min

## 2020-01-07 NOTE — Assessment & Plan Note (Signed)
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  Both mastoid sinuses are clear. Mild to moderate nasal septal deviation. - HRCT chest 09/19/2018  1. Mild cylindrical and varicoid bronchiectasis in the mid to lower lungs, unchanged. Associated mild bronchial wall thickening is slightly improved. 2. Stable scattered parenchymal bands in the mid to lower lungs, compatible with postinfectious/postinflammatory scarring. 3. Two month stability of 5 mm apical right upper lobe solid pulmonary nodule, more likely benign. Suggest follow-up chest CT in 6-12 months given the history of smoking and given that this nodule was previously seen to be new compared to 07/07/2017 chest CT. 4. Three-vessel coronary atherosclerosis. 5. Moderate hiatal hernia.  Flare x 10 days > rec augmentin bid x 10 d and try change to breztri.

## 2020-01-24 DIAGNOSIS — I4891 Unspecified atrial fibrillation: Secondary | ICD-10-CM | POA: Diagnosis not present

## 2020-01-24 DIAGNOSIS — Z7901 Long term (current) use of anticoagulants: Secondary | ICD-10-CM | POA: Diagnosis not present

## 2020-01-24 DIAGNOSIS — J45909 Unspecified asthma, uncomplicated: Secondary | ICD-10-CM | POA: Diagnosis not present

## 2020-01-24 DIAGNOSIS — Z7951 Long term (current) use of inhaled steroids: Secondary | ICD-10-CM | POA: Diagnosis not present

## 2020-01-24 DIAGNOSIS — M25569 Pain in unspecified knee: Secondary | ICD-10-CM | POA: Diagnosis not present

## 2020-01-24 DIAGNOSIS — G8929 Other chronic pain: Secondary | ICD-10-CM | POA: Diagnosis not present

## 2020-01-24 DIAGNOSIS — E785 Hyperlipidemia, unspecified: Secondary | ICD-10-CM | POA: Diagnosis not present

## 2020-01-24 DIAGNOSIS — D6869 Other thrombophilia: Secondary | ICD-10-CM | POA: Diagnosis not present

## 2020-01-24 DIAGNOSIS — K219 Gastro-esophageal reflux disease without esophagitis: Secondary | ICD-10-CM | POA: Diagnosis not present

## 2020-01-24 DIAGNOSIS — I10 Essential (primary) hypertension: Secondary | ICD-10-CM | POA: Diagnosis not present

## 2020-01-27 ENCOUNTER — Other Ambulatory Visit: Payer: Self-pay | Admitting: Family Medicine

## 2020-01-30 ENCOUNTER — Other Ambulatory Visit: Payer: Self-pay | Admitting: Family Medicine

## 2020-02-13 ENCOUNTER — Other Ambulatory Visit: Payer: Self-pay | Admitting: Family Medicine

## 2020-04-06 ENCOUNTER — Ambulatory Visit: Payer: Medicare HMO | Admitting: Internal Medicine

## 2020-04-09 NOTE — Progress Notes (Signed)
@Patient  ID: Mario [name], male    DOB: [birth year], 74 y.o.   MRN: [id number]  Chief Complaint  Patient presents with  . Follow-up    Bronchiectasis, asthma.  Breztri not helping.  Breathing good.  Cough with clear phlem.     Referring provider: [name], MD  HPI:  74 year old male former smoker followed in our office for asthma, chronic cough, asthma COPD overlap, obstructive sleep apnea  PMH: Hypertension, hyperlipidemia, A. fib Smoker/ Smoking History: Former smoker Maintenance: Symbicort 160 Pt of: Dr. Melvyn Novas  04/10/2020  - Visit   74 year old male former smoker followed in our office for asthma, chronic cough and bronchiectasis.  Patient presenting to our office today as a follow-up.  He reports that he inhaler - Judithann Sauger is not helping. Pt transitioned back to symbicort 160.  Patient continues to have a cough with clear phlegm.   Patient does have a flutter valve at home he admits he does not use it.  He coughs every morning.  And then he reports he continues to have an occasional cough throughout the day.  Phlegm is clear.  He reports it is bothersome to his wife.  He would like to have better management of his cough.  Will discuss this today  Patient reports he needs refills of rescue inhaler.  He does not like using his albuterol nebulizer because he reports that it makes his "lungs ache".  Patient reports occasional nasal drainage.  Especially after he eats.  Currently using Atrovent nasal spray and nasal saline rinses.  Questionaires / Pulmonary Flowsheets:   ACT: Asthma Control Test ACT Total Score  04/10/2020 24    Tests:   - Alpha one Screen 07/05/2018   MM level 171 - Allergy profile 07/05/18  >  Eos 0.1 /  IgE  652  Pos allergies to grass and mold and dust  PFT's  08/13/2018  FEV1 1.85 (63 % ) ratio 62   p symb 160/spiriva  And saba   prior to study with DLCO  75/74c % corrects to 108  % for alv volume    09/19/2018-CT chest high-res-mild cylindrical  varicoid bronchiectasis in the mid to lower lungs, unchanged, associated mild bronchial wall thickening slightly improved, stable scattered parenchymal bands in the mid to lower lungs compatible with postinfectious/postinflammatory scarring, 60-month stability of 5 mm apical right upper lobe lobe solid pulmonary nodule, more likely benign, suggest follow-up chest CT in 6 to 12 months given history of smoking and given that this nodule was previously seen to be new compared to 07/07/2017 chest CT, moderate hiatal hernia  07/18/2018-CT maxillofacial without contrast-mucosal edema in the paranasal sinuses as above, occlusion of right frontal sinus and right maxillary sinus due to mucosal edema, chronic mastoiditis of the right, mild to moderate nasal septal deviation  FENO:  No results found for: NITRICOXIDE  PFT: PFT Results Latest Ref Rng & Units 08/13/2018  FVC-Pre L 2.99  FVC-Predicted Pre % 73  Pre FEV1/FVC % % 62  FEV1-Pre L 1.85  FEV1-Predicted Pre % 63  DLCO UNC% % 75  DLCO COR %Predicted % 108  TLC L 5.95  TLC % Predicted % 88  RV % Predicted % 120    WALK:  No flowsheet data found.  Imaging: No results found.  Lab Results:  CBC    Component Value Date/Time   WBC 19.1 (H) 09/16/2019 0341   RBC 4.33 09/16/2019 0341   HGB 13.7 09/16/2019 0341   HCT 41.7 09/16/2019  0341   PLT 255 09/16/2019 0341   MCV 96.3 09/16/2019 0341   MCH 31.6 09/16/2019 0341   MCHC 32.9 09/16/2019 0341   RDW 14.2 09/16/2019 0341   LYMPHSABS 1.1 09/16/2019 0341   MONOABS 1.3 (H) 09/16/2019 0341   EOSABS 0.0 09/16/2019 0341   BASOSABS 0.0 09/16/2019 0341    BMET    Component Value Date/Time   NA 136 09/16/2019 0341   K 4.4 09/16/2019 0341   CL 103 09/16/2019 0341   CO2 23 09/16/2019 0341   GLUCOSE 131 (H) 09/16/2019 0341   BUN 15 09/16/2019 0341   CREATININE 1.18 09/16/2019 0341   CREATININE 0.96 10/05/2018 0817   CALCIUM 8.9 09/16/2019 0341   GFRNONAA >60 09/16/2019 0341   GFRNONAA 78  10/05/2018 0817   GFRAA >60 09/16/2019 0341   GFRAA 91 10/05/2018 0817    BNP    Component Value Date/Time   BNP 38 06/12/2018 1455    ProBNP No results found for: PROBNP  Specialty Problems      Pulmonary Problems   Obstructive sleep apnea    Obstructive sleep apnea      COPD (chronic obstructive pulmonary disease) with chronic bronchitis (HCC)   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  Both mastoid sinuses are clear. Mild to moderate nasal septal deviation. - HRCT chest 09/19/2018  1. Mild cylindrical and varicoid bronchiectasis in the mid to lower lungs, unchanged. Associated mild bronchial wall thickening is slightly improved. 2. Stable scattered parenchymal bands in the mid to lower lungs, compatible with postinfectious/postinflammatory scarring. 3. Two month stability of 5 mm apical right upper lobe solid pulmonary nodule, more likely benign. Suggest follow-up chest CT in 6-12 months given the history of smoking and given that this nodule was previously seen to be new compared to 07/07/2017 chest CT. 4. Three-vessel coronary atherosclerosis. 5. Moderate hiatal hernia.      Asthma, chronic, moderate persistent vs ACOS in former smoker     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  PFT's  08/13/2018  FEV1 1.85 (63 % ) ratio 62   p symb 160/spiriva  And saba   prior to study with DLCO  75/74c % corrects to 108  % for alv  volume   - 09/24/2018   try off spiriva > no worse as of 12/25/2018  - 06/27/2019   try reduce symb to 80 2bid to reduce flares of purulent tracheobronchitis > preferred symb 160 - 01/06/2020  After extensive coaching inhaler device,  effectiveness =    90% and flaring on symb 160 > try change to breztri 2bid x 2 weeks        Chronic frontal sinusitis   Chronic maxillary sinusitis   Deviated nasal septum   Chronic cough      No Known  Allergies  Immunization History  Administered Date(s) Administered  . Fluad Quad(high Dose 65+) 09/09/2019  . Influenza Split 09/09/2013  . Influenza, High Dose Seasonal PF 09/22/2015, 09/27/2016, 09/27/2016, 09/11/2017  . Influenza,inj,Quad PF,6+ Mos 09/03/2018  . Influenza-Unspecified 09/18/2014  . PFIZER SARS-COV-2 Vaccination 02/05/2020, 02/26/2020  . Pneumococcal Conjugate-13 02/17/2014  . Pneumococcal Polysaccharide-23 10/13/2009, 04/16/2015  . Tdap 03/13/2007  . Zoster 05/29/2015   Shingrix - 2016  Past Medical History:  Diagnosis Date  . Asthma   . Biliary colic 123456  . COPD (chronic obstructive pulmonary disease) with chronic bronchitis ([place])   . Coronary artery calcification seen on CAT scan   . Cyst of pancreas    1 cm (7/18), repeat ct in 12 months.    . Hyperlipidemia   . Hypertension   . Obstructive sleep apnea   . Renal disorder   . Rhinitis   . Sleep apnea    pt denies sleep apnea-lost weight    Tobacco History: Social History   Tobacco Use  Smoking Status Former Smoker  . Packs/day: 2.00  . Years: 11.00  . Pack years: 22.00  . Types: Cigarettes  . Quit date: 12/19/1974  . Years since quitting: 45.3  Smokeless Tobacco Never Used   Counseling given: Yes   Continue to not smoke  Outpatient Encounter Medications as of 04/10/2020  Medication Sig  . albuterol (PROVENTIL) (2.5 MG/3ML) 0.083% nebulizer solution Take 3 mLs (2.5 mg total) by nebulization every 6 (six) hours as needed for wheezing or shortness of breath.    Marland Kitchen atorvastatin (LIPITOR) 40 MG tablet Take 1 tablet (40 mg total) by mouth daily.  . Cholecalciferol (VITAMIN D3) 125 MCG (5000 UT) TABS Take by mouth.  . Coenzyme Q10 (CO Q-10) 100 MG CAPS Take 1 capsule by mouth daily.  Marland Kitchen dextromethorphan-guaiFENesin (MUCINEX DM) 30-600 MG 12hr tablet Take 1 tablet by mouth at bedtime as needed for cough.  Marland Kitchen ELIQUIS 5 MG TABS tablet TAKE 1 TABLET BY MOUTH TWICE A DAY  . FIBER COMPLETE PO Take 1 tablet by mouth daily.  Marland Kitchen glucosamine-chondroitin 500-400 MG tablet Take 1 tablet by mouth 3 (three) times daily.  . hydrochlorothiazide (HYDRODIURIL) 25 MG tablet TAKE 1 TABLET BY MOUTH EVERY DAY  . losartan (COZAAR) 100 MG tablet TAKE 1 TABLET BY MOUTH EVERY DAY  . metoprolol succinate (TOPROL-XL) 25 MG 24 hr tablet TAKE 1 TABLET BY MOUTH EVERY DAY  . montelukast (SINGULAIR) 10 MG tablet TAKE 1 TABLET BY MOUTH EVERY DAY  . pantoprazole (PROTONIX) 40 MG tablet TAKE 1 TABLET BY MOUTH TWICE A DAY (Patient taking differently: Take 40 mg by mouth daily. )  . PROAIR HFA 108 (90 Base) MCG/ACT inhaler TAKE 2 PUFFS BY MOUTH EVERY 6 HOURS AS NEEDED FOR WHEEZE OR SHORTNESS OF BREATH (Patient taking differently: Inhale 2 puffs into the lungs every 6 (six) hours as needed for wheezing or shortness of breath. )  . Respiratory Therapy Supplies (FLUTTER) DEVI Use as directed  . senna (SENOKOT) 8.6 MG TABS tablet Take 1 tablet (8.6 mg total) by mouth daily as needed for mild constipation.  . SYMBICORT 160-4.5 MCG/ACT inhaler INHALE 2 PUFFS INTO THE LUNGS 2 TIMES DAILY. (Patient taking differently: Inhale 2 puffs into the lungs 2 (two) times daily. )  . therapeutic multivitamin-minerals (THERAGRAN-M) tablet Take 1 tablet by mouth daily.  Marland Kitchen UNABLE TO FIND Med Name: clear lungs otc 4 x daily  . Budeson-Glycopyrrol-Formoterol (BREZTRI AEROSPHERE) 160-9-4.8 MCG/ACT AERO Inhale 2 puffs into the lungs  2 (two) times daily. (Patient not taking: Reported on 04/10/2020)  .  Budeson-Glycopyrrol-Formoterol (BREZTRI AEROSPHERE) 160-9-4.8 MCG/ACT AERO Inhale 2 puffs into the lungs 2 (two) times daily. (Patient not taking: Reported on 04/10/2020)  . [DISCONTINUED] predniSONE (DELTASONE) 10 MG tablet Take  4 each am x 2 days,   2 each am x 2 days,  1 each am x 2 days and stop (Patient not taking: Reported on 04/10/2020)   No facility-administered encounter medications on file as of 04/10/2020.     Review of Systems  Review of Systems  Constitutional: Negative for activity change, chills, fatigue, fever and unexpected weight change.  HENT: Positive for postnasal drip and rhinorrhea. Negative for sinus pressure, sinus pain and sore throat.   Eyes: Negative.   Respiratory: Positive for cough. Negative for shortness of breath and wheezing.   Cardiovascular: Negative for chest pain and palpitations.  Gastrointestinal: Negative for constipation, diarrhea, nausea and vomiting.  Endocrine: Negative.   Genitourinary: Negative.   Musculoskeletal: Negative.   Skin: Negative.   Neurological: Negative for dizziness and headaches.  Psychiatric/Behavioral: Negative.  Negative for dysphoric mood. The patient is not nervous/anxious.   All other systems reviewed and are negative.    Physical Exam  BP 126/86 (BP Location: Left Arm, Cuff Size: Normal)   Pulse (!) 54   Temp 97.6 F (36.4 C) (Temporal)   Ht 5\' 9"  (1.753 m)   Wt 203 lb 12.8 oz (92.4 kg)   SpO2 95%   BMI 30.10 kg/m   Wt Readings from Last 5 Encounters:  04/10/20 203 lb 12.8 oz (92.4 kg)  01/06/20 200 lb (90.7 kg)  11/13/19 194 lb (88 kg)  09/15/19 185 lb (83.9 kg)  06/27/19 192 lb 3.2 oz (87.2 kg)    BMI Readings from Last 5 Encounters:  04/10/20 30.10 kg/m  01/06/20 29.53 kg/m  11/13/19 28.65 kg/m  09/15/19 27.31 kg/m  06/27/19 28.38 kg/m     Physical Exam Vitals and nursing note reviewed.  Constitutional:      General: He is not in acute distress.    Appearance: Normal appearance. He is  obese.  HENT:     Head: Normocephalic and atraumatic.     Right Ear: Hearing and external ear normal.     Left Ear: Hearing and external ear normal.     Nose: No mucosal edema.     Right Turbinates: Not enlarged.     Left Turbinates: Not enlarged.     Comments: Deferred due to masking requirement    Mouth/Throat:     Comments: Deferred due to masking requirement Eyes:     Pupils: Pupils are equal, round, and reactive to light.  Cardiovascular:     Rate and Rhythm: Normal rate and regular rhythm.     Pulses: Normal pulses.     Heart sounds: Normal heart sounds. No murmur.  Pulmonary:     Effort: Pulmonary effort is normal.     Breath sounds: No decreased breath sounds, wheezing or rales.  Abdominal:     General: Bowel sounds are normal. There is no distension.     Palpations: Abdomen is soft.     Tenderness: There is no abdominal tenderness.  Musculoskeletal:     Cervical back: Normal range of motion.     Right lower leg: No edema.     Left lower leg: No edema.  Lymphadenopathy:     Cervical: No cervical adenopathy.  Skin:    General: Skin is warm and dry.  Capillary Refill: Capillary refill takes less than 2 seconds.     Findings: No erythema or rash.  Neurological:     General: No focal deficit present.     Mental Status: He is alert and oriented to person, place, and time.     Motor: No weakness.     Coordination: Coordination normal.     Gait: Gait is intact. Gait normal.  Psychiatric:        Mood and Affect: Mood normal.        Behavior: Behavior normal. Behavior is cooperative.        Thought Content: Thought content normal.        Judgment: Judgment normal.       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 (HCC) Known bronchiectasis Chronic sinusitis in the past Mild cylindrical  varicoid bronchiectasis Continued cough with clear mucus Has flutter valve at home, noncompliant with use  Plan: Resume flutter valve use twice daily scheduled Continue Mucinex Remain hydrated, take Mucinex with 1 glass of water Can use albuterol nebulized med prior to flutter valve to see if this helps with sputum production Sputum cups provided today Patient to present sputum sample if he starts to have discolored mucus or worsened symptoms prior to antibiotic use 37-month follow-up  Obstructive sleep apnea Per chart review history of OSA  Plan: Continue CPAP    Return in about 3 months (around 07/10/2020), or if symptoms worsen or fail to improve, for Follow up with Dr. Melvyn Novas.   [name], NP 04/10/2020   This appointment required 32 minutes of patient care (this includes precharting, chart review, review of results, face-to-face care, etc.).

## 2020-04-10 ENCOUNTER — Encounter: Payer: Self-pay | Admitting: Pulmonary Disease

## 2020-04-10 ENCOUNTER — Ambulatory Visit: Payer: Medicare HMO | Admitting: Internal Medicine

## 2020-04-10 ENCOUNTER — Ambulatory Visit: Payer: Medicare HMO | Admitting: Pulmonary Disease

## 2020-04-10 ENCOUNTER — Other Ambulatory Visit: Payer: Self-pay

## 2020-04-10 VITALS — BP 126/86 | HR 54 | Temp 97.6°F | Ht 69.0 in | Wt 203.8 lb

## 2020-04-10 DIAGNOSIS — J4540 Moderate persistent asthma, uncomplicated: Secondary | ICD-10-CM

## 2020-04-10 DIAGNOSIS — G4733 Obstructive sleep apnea (adult) (pediatric): Secondary | ICD-10-CM

## 2020-04-10 DIAGNOSIS — J479 Bronchiectasis, uncomplicated: Secondary | ICD-10-CM | POA: Diagnosis not present

## 2020-04-10 NOTE — Patient Instructions (Addendum)
You were seen today by [name], NP  for:   1. 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 Use a flutter valve 10 breaths twice a day or 4 to 5 breaths 4-5 times a day to help clear mucus out Let Mario [name] know if you have cough with change in mucus color or fevers or chills.  At that point you would need an antibiotic. Maintain a healthy nutritious diet, eating whole foods Take your medications as prescribed    We will provide you 3 sputum cups.  If you produce discolored mucus or you feel it you are coughing up more mucus than usual and not feeling well please coughed directly into these cups.  And present to our lab within 3 hours.  Review the instructions provided today  2. Asthma, chronic, moderate persistent vs ACOS in former smoker   Continue Symbicort 160 >>> 2 puffs in the morning right when you wake up, rinse out your mouth after use, 12 hours later 2 puffs, rinse after use >>> Take this daily, no matter what >>> This is not a rescue inhaler   Only use your albuterol as a rescue medication to be used if you can't catch your breath by resting or doing a relaxed purse lip breathing pattern.  - The less you use it, the better it will work when you need it. - Ok to use up to 2 puffs  every 4 hours if you must but call for immediate appointment if use goes up over your usual need - Don't leave home without it !!  (think of it like the spare tire for your car)    Follow Up:    Return in about 3 months (around 07/10/2020), or if symptoms worsen or fail to improve, for Follow up with Dr. Melvyn Novas.   Please do your part to reduce the spread of COVID-19:      Reduce your risk of any infection  and COVID19 by using the similar precautions used for avoiding the common cold or flu:  Marland Kitchen Wash your hands often with soap and warm water for at least  20 seconds.  If soap and water are not readily available, use an alcohol-based hand sanitizer with at least 60% alcohol.  . If coughing or sneezing, cover your mouth and nose by coughing or sneezing into the elbow areas of your shirt or coat, into a tissue or into your sleeve (not your hands). Langley Gauss A MASK when in public  . Avoid shaking hands with others and consider head nods or verbal greetings only. . Avoid touching your eyes, nose, or mouth with unwashed hands.  . Avoid close contact with people who are sick. . Avoid places or events with large numbers of people in one location, like concerts or sporting events. . If you have some symptoms but not all symptoms, continue to monitor at home and seek medical attention if your symptoms worsen. . If you are having a medical emergency, call 911.   [place] / e-Visit: [email or web address]         [facility]: [phone]  [facility]: [id number]                   [facility]: [id number]     It is flu season:   >>> Best  ways to protect herself from the flu: Receive the yearly flu vaccine, practice good hand hygiene washing with soap and also using hand sanitizer when available, eat a nutritious meals, get adequate rest, hydrate appropriately   Please contact the office if your symptoms worsen or you have concerns that you are not improving.   Thank you for choosing [facility] for your healthcare, and for allowing Mario [name] to partner with you on your healthcare journey. I am thankful to be able to provide care to you today.   Wyn Quaker FNP-C    Bronchiectasis  Bronchiectasis is a condition in which the airways in the lungs (bronchi) are damaged and widened. The condition makes it hard for the lungs to get rid of mucus, and it causes mucus to gather in the bronchi. This condition often  leads to lung infections, which can make the condition worse. What are the causes? You can be born with this condition or you can develop it later in life. Common causes of this condition include:  Cystic fibrosis.  Repeated lung infections, such as pneumonia or tuberculosis.  An object or other blockage in the lungs.  Breathing in fluid, food, or other objects (aspiration).  A problem with the immune system and lung structure that is present at birth (congenital). Sometimes the cause is not known. What are the signs or symptoms? Common symptoms of this condition include:  A daily cough that brings up mucus and lasts for more than 3 weeks.  Lung infections that happen often.  Shortness of breath and wheezing.  Weakness and fatigue. How is this diagnosed? This condition is diagnosed with tests, such as:  Chest X-rays or CT scans. These are done to check for changes in the lungs.  Breathing tests. These are done to check how well your lungs are working.  A test of a sample of your saliva (sputum culture). This test is done to check for infection.  Blood tests and other tests. These are done to check for related diseases or causes. How is this treated? Treatment for this condition depends on the severity of the illness and its cause. Treatment may include:  Medicines that loosen mucus so it can be coughed up (expectorants).  Medicines that relax the muscles of the bronchi (bronchodilators).  Antibiotic medicines to prevent or treat infection.  Physical therapy to help clear mucus from the lungs. Techniques may include: ? Postural drainage. This is when you sit or lie in certain positions so that mucus can drain by gravity. ? Chest percussion. This involves tapping the chest or back with a cupped hand. ? Chest vibration. For this therapy, a hand or special equipment vibrates your chest and back.  Surgery to remove the affected part of the lung. This may be done in severe  cases. Follow these instructions at home: Medicines  Take over-the-counter and prescription medicines only as told by your health care provider.  If you were prescribed an antibiotic medicine, take it as told by your health care provider. Do not stop taking the antibiotic even if you start to feel better.  Avoid taking sedatives and antihistamines unless your health care provider tells you to take them. These medicines tend to thicken the mucus in the lungs. Managing symptoms  Perform breathing exercises or techniques to clear your lungs as told by your health care provider.  Consider using a cold steam vaporizer or humidifier in your room or home to help loosen secretions.  If you have a cough  that gets worse at night, try sleeping in a semi-upright position. General instructions  Get plenty of rest.  Drink enough fluid to keep your urine clear or pale yellow.  Stay inside when pollution and ozone levels are high.  Stay up to date with vaccinations and immunizations.  Avoid cigarette smoke and other lung irritants.  Do not use any products that contain nicotine or tobacco, such as cigarettes and e-cigarettes. If you need help quitting, ask your health care provider.  Keep all follow-up visits as told by your health care provider. This is important. Contact a health care provider if:  You cough up more sputum than before and the sputum is yellow or green in color.  You have a fever.  You cannot control your cough and are losing sleep. Get help right away if:  You cough up blood.  You have chest pain.  You have increasing shortness of breath.  You have pain that gets worse or is not controlled with medicines.  You have a fever and your symptoms suddenly get worse. Summary  Bronchiectasis is a condition in which the airways in the lungs (bronchi) are damaged and widened. The condition makes it hard for the lungs to get rid of mucus, and it causes mucus to gather in  the bronchi.  Treatment usually includes therapy to help clear mucus from the lungs.  Stay up to date with vaccinations and immunizations. This information is not intended to replace advice given to you by your health care provider. Make sure you discuss any questions you have with your health care provider. Document Revised: 11/17/2017 Document Reviewed: 01/09/2017 Elsevier Patient Education  2020 [employer].

## 2020-04-10 NOTE — Assessment & Plan Note (Signed)
Per chart review history of OSA  Plan: Continue CPAP

## 2020-04-10 NOTE — Assessment & Plan Note (Signed)
Known bronchiectasis Chronic sinusitis in the past Mild cylindrical varicoid bronchiectasis Continued cough with clear mucus Has flutter valve at home, noncompliant with use  Plan: Resume flutter valve use twice daily scheduled Continue Mucinex Remain hydrated, take Mucinex with 1 glass of water Can use albuterol nebulized med prior to flutter valve to see if this helps with sputum production Sputum cups provided today Patient to present sputum sample if he starts to have discolored mucus or worsened symptoms prior to antibiotic use 24-month follow-up

## 2020-04-10 NOTE — Addendum Note (Signed)
Addended by: Vanessa Barbara on: 04/10/2020 09:42 AM   Modules accepted: Orders

## 2020-04-10 NOTE — Assessment & Plan Note (Signed)
None: Continue Symbicort 160 Refilled rescue inhaler 80-month follow-up with our office

## 2020-04-23 ENCOUNTER — Other Ambulatory Visit: Payer: Self-pay | Admitting: Family Medicine

## 2020-04-24 ENCOUNTER — Other Ambulatory Visit: Payer: Self-pay | Admitting: Family Medicine

## 2020-07-02 ENCOUNTER — Other Ambulatory Visit: Payer: Self-pay | Admitting: Cardiovascular Disease

## 2020-07-10 ENCOUNTER — Ambulatory Visit: Payer: Medicare HMO | Admitting: Internal Medicine

## 2020-07-13 ENCOUNTER — Encounter: Payer: Self-pay | Admitting: Pulmonary Disease

## 2020-07-13 ENCOUNTER — Ambulatory Visit: Payer: Medicare HMO | Admitting: Pulmonary Disease

## 2020-07-13 ENCOUNTER — Other Ambulatory Visit: Payer: Self-pay

## 2020-07-13 VITALS — BP 132/74 | HR 50 | Temp 98.2°F | Ht 69.0 in | Wt 213.6 lb

## 2020-07-13 DIAGNOSIS — J4540 Moderate persistent asthma, uncomplicated: Secondary | ICD-10-CM | POA: Diagnosis not present

## 2020-07-13 DIAGNOSIS — J479 Bronchiectasis, uncomplicated: Secondary | ICD-10-CM | POA: Diagnosis not present

## 2020-07-13 DIAGNOSIS — G4733 Obstructive sleep apnea (adult) (pediatric): Secondary | ICD-10-CM

## 2020-07-13 DIAGNOSIS — Z79899 Other long term (current) drug therapy: Secondary | ICD-10-CM

## 2020-07-13 MED ORDER — ALBUTEROL SULFATE (2.5 MG/3ML) 0.083% IN NEBU: 2.5000 mg | INHALATION_SOLUTION | Freq: Four times a day (QID) | RESPIRATORY_TRACT | 12 refills | Status: DC | PRN

## 2020-07-13 NOTE — Assessment & Plan Note (Signed)
In donut hole Unable to afford Symbicort 160 He is unsure if he would qualify for patient assistance  Plan: Refer to [facility] to help with medication cost and patient assistance

## 2020-07-13 NOTE — Assessment & Plan Note (Signed)
Stopped CPAP therapy 1 year ago   Not interested in resuming

## 2020-07-13 NOTE — Assessment & Plan Note (Signed)
Plan: Continue Symbicort 160 Refill rescue inhaler Refill albuterol nebs We will refer to [facility] to help with medication cost Follow-up in 3 to 4 months with Dr. Melvyn Novas

## 2020-07-13 NOTE — Progress Notes (Signed)
@Patient  ID: Mario [name], male    DOB: 11/17/1946, 74 y.o.   MRN: [id number]  Chief Complaint  Patient presents with  . Follow-up    productive cough with clear sputum    Referring provider: [name], MD  HPI:  74 year old male former smoker followed in our office for COPD, bronchiectasis, chronic cough  PMH: Hypertension, hyperlipidemia, CAD Smoker/ Smoking History: Former smoker.  Quit 1976.  22-pack-year smoking history. Maintenance:  Symbicort 160 Pt of: Dr. Melvyn Novas  07/13/2020  - Visit   74 year old male former smoker followed in our office for bronchiectasis, chronic cough, COPD/asthma.  Followed by Dr. Melvyn Novas.  Completing a 78-month follow-up with our office today.  Patient reporting that he prefers to remain on Symbicort 160.  He has known bronchiectasis but is not adherent to a flutter valve.  We will discuss and evaluate this today.  He continues to have a productive cough.  He would like refills of his albuterol nebulizers.  Patient reports he is not had to use his rescue inhaler recently.  Maybe 1 time a month.  He does occasionally uses albuterol nebulized meds especially when he is feeling more congested.  Patient is now in the donut hole.  He reports the Symbicort 160 is very expensive.  He is unsure if he would qualify for patient assistance.  He is willing to discuss this today.  Questionaires / Pulmonary Flowsheets:   ACT:  Asthma Control Test ACT Total Score  04/10/2020 24    MMRC: No flowsheet data found.  Epworth:  No flowsheet data found.  Tests:   09/19/2018-CT chest high-res-mild cylindrical and varicoid bronchiectasis in the mid to lower lungs, unchanged, associated with mild bronchial wall thickening slightly improved, stable scattered parenchymal bands in the mid to lower lungs, 12-month stability of 5 mm apical right upper lobe solid pulmonary nodule, more likely benign, suggest follow-up CT chest in 6 to 12 months  09/13/2019-chest  x-ray-chronic bibasilar scarring and bronchiectasis, no new acute or cardiopulmonary findings  08/13/2018-pulmonary function test-FVC 2.99 (73% predicted), ratio 62, FEV1 1.85 (63% predicted), TLC 5.95 (88% predicted), DLCO 23 (75% predicted)  FENO:  No results found for: NITRICOXIDE  PFT: PFT Results Latest Ref Rng & Units 08/13/2018  FVC-Pre L 2.99  FVC-Predicted Pre % 73  Pre FEV1/FVC % % 62  FEV1-Pre L 1.85  FEV1-Predicted Pre % 63  DLCO UNC% % 75  DLCO COR %Predicted % 108  TLC L 5.95  TLC % Predicted % 88  RV % Predicted % 120    WALK:  No flowsheet data found.  Imaging: No results found.  Lab Results:  CBC    Component Value Date/Time   WBC 19.1 (H) 09/16/2019 0341   RBC 4.33 09/16/2019 0341   HGB 13.7 09/16/2019 0341   HCT 41.7 09/16/2019 0341   PLT 255 09/16/2019 0341   MCV 96.3 09/16/2019 0341   MCH 31.6 09/16/2019 0341   MCHC 32.9 09/16/2019 0341   RDW 14.2 09/16/2019 0341   LYMPHSABS 1.1 09/16/2019 0341   MONOABS 1.3 (H) 09/16/2019 0341   EOSABS 0.0 09/16/2019 0341   BASOSABS 0.0 09/16/2019 0341    BMET    Component Value Date/Time   NA 136 09/16/2019 0341   K 4.4 09/16/2019 0341   CL 103 09/16/2019 0341   CO2 23 09/16/2019 0341   GLUCOSE 131 (H) 09/16/2019 0341   BUN 15 09/16/2019 0341   CREATININE 1.18 09/16/2019 0341   CREATININE 0.96 10/05/2018 0817  CALCIUM 8.9 09/16/2019 0341   GFRNONAA >60 09/16/2019 0341   GFRNONAA 78 10/05/2018 0817   GFRAA >60 09/16/2019 0341   GFRAA 91 10/05/2018 0817    BNP    Component Value Date/Time   BNP 38 06/12/2018 1455    ProBNP No results found for: PROBNP  Specialty Problems      Pulmonary Problems   Obstructive sleep apnea    Obstructive sleep apnea      COPD (chronic obstructive pulmonary disease) with chronic bronchitis (HCC)   Bronchiectasis without complication ([place])    See CT chest 07/06/18 worse since 07/07/17  - Quant Ig's 07/05/2018  wnl - Alpha one Screen 07/05/2018   MM level  171 - Allergy profile 07/05/18  >  Eos 0.1 /  IgE  652  Pos allergies to grass and mold and dust  - trained on use of flutter valve 07/05/2018  - Sinus CT:  Mucosal edema in the paranasal sinuses as above. 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  Both mastoid sinuses are clear. Mild to moderate nasal septal deviation. - HRCT chest 09/19/2018  1. Mild cylindrical and varicoid bronchiectasis in the mid to lower lungs, unchanged. Associated mild bronchial wall thickening is slightly improved. 2. Stable scattered parenchymal bands in the mid to lower lungs, compatible with postinfectious/postinflammatory scarring. 3. Two month stability of 5 mm apical right upper lobe solid pulmonary nodule, more likely benign. Suggest follow-up chest CT in 6-12 months given the history of smoking and given that this nodule was previously seen to be new compared to 07/07/2017 chest CT. 4. Three-vessel coronary atherosclerosis. 5. Moderate hiatal hernia.      Asthma, chronic, moderate persistent vs ACOS in former smoker     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  PFT's  08/13/2018  FEV1 1.85 (63 % ) ratio 62   p symb 160/spiriva  And saba   prior to study with DLCO  75/74c % corrects to 108  % for alv volume   - 09/24/2018   try off spiriva > no worse as of 12/25/2018  - 06/27/2019   try reduce symb to 80 2bid to reduce flares of purulent tracheobronchitis > preferred symb 160 - 01/06/2020  After extensive coaching inhaler device,  effectiveness =    90% and flaring on symb 160 > try change to breztri 2bid x 2 weeks        Chronic frontal sinusitis   Chronic maxillary sinusitis   Deviated nasal septum   Chronic cough      No Known Allergies  Immunization History  Administered Date(s) Administered  . Fluad  Quad(high Dose 65+) 09/09/2019  . Influenza Split 09/09/2013  . Influenza, High Dose Seasonal PF 09/22/2015, 09/27/2016, 09/27/2016, 09/11/2017  . Influenza,inj,Quad PF,6+ Mos 09/03/2018  . Influenza-Unspecified 09/18/2014  . PFIZER SARS-COV-2 Vaccination 02/05/2020, 02/26/2020  . Pneumococcal Conjugate-13 02/17/2014  . Pneumococcal Polysaccharide-23 10/13/2009, 04/16/2015  . Tdap 03/13/2007  . Zoster 05/29/2015    Past Medical History:  Diagnosis Date  . Asthma   . Biliary colic 1245  . COPD (chronic obstructive pulmonary disease) with chronic bronchitis ([place])   . Coronary artery calcification seen on CAT scan   . Cyst of pancreas    1 cm (  7/18), repeat ct in 12 months.    . Hyperlipidemia   . Hypertension   . Obstructive sleep apnea   . Renal disorder   . Rhinitis   . Sleep apnea    pt denies sleep apnea-lost weight    Tobacco History: Social History   Tobacco Use  Smoking Status Former Smoker  . Packs/day: 2.00  . Years: 11.00  . Pack years: 22.00  . Types: Cigarettes  . Quit date: 12/19/1974  . Years since quitting: 45.5  Smokeless Tobacco Never Used   Counseling given: Yes   Continue to not smoke  Outpatient Encounter Medications as of 07/13/2020  Medication Sig  . albuterol (PROVENTIL) (2.5 MG/3ML) 0.083% nebulizer solution Take 3 mLs (2.5 mg total) by nebulization every 6 (six) hours as needed for wheezing or shortness of breath.  Marland Kitchen atorvastatin (LIPITOR) 40 MG tablet Take 1 tablet (40 mg total) by mouth daily.  . Cholecalciferol (VITAMIN D3) 125 MCG (5000 UT) TABS Take by mouth.  . Coenzyme Q10 (CO Q-10) 100 MG CAPS Take 1 capsule by mouth daily.  Marland Kitchen dextromethorphan-guaiFENesin (MUCINEX DM) 30-600 MG 12hr tablet Take 1 tablet by mouth at bedtime as needed for cough.  Marland Kitchen ELIQUIS 5 MG TABS tablet TAKE 1 TABLET BY MOUTH TWICE A DAY  . FIBER COMPLETE PO Take 1 tablet by mouth daily.  Marland Kitchen glucosamine-chondroitin 500-400 MG tablet Take 1 tablet by mouth 3 (three)  times daily.  . hydrochlorothiazide (HYDRODIURIL) 25 MG tablet TAKE 1 TABLET BY MOUTH EVERY DAY  . losartan (COZAAR) 100 MG tablet TAKE 1 TABLET BY MOUTH EVERY DAY  . metoprolol succinate (TOPROL-XL) 25 MG 24 hr tablet TAKE 1 TABLET BY MOUTH EVERY DAY  . montelukast (SINGULAIR) 10 MG tablet TAKE 1 TABLET BY MOUTH EVERY DAY  . pantoprazole (PROTONIX) 40 MG tablet TAKE 1 TABLET BY MOUTH TWICE A DAY  . PROAIR HFA 108 (90 Base) MCG/ACT inhaler TAKE 2 PUFFS BY MOUTH EVERY 6 HOURS AS NEEDED FOR WHEEZE OR SHORTNESS OF BREATH (Patient taking differently: Inhale 2 puffs into the lungs every 6 (six) hours as needed for wheezing or shortness of breath. )  . Respiratory Therapy Supplies (FLUTTER) DEVI Use as directed  . SYMBICORT 160-4.5 MCG/ACT inhaler INHALE 2 PUFFS INTO THE LUNGS 2 TIMES DAILY. (Patient taking differently: Inhale 2 puffs into the lungs 2 (two) times daily. )  . therapeutic multivitamin-minerals (THERAGRAN-M) tablet Take 1 tablet by mouth daily.  Marland Kitchen UNABLE TO FIND Med Name: clear lungs otc 4 x daily  . [DISCONTINUED] albuterol (PROVENTIL) (2.5 MG/3ML) 0.083% nebulizer solution Take 3 mLs (2.5 mg total) by nebulization every 6 (six) hours as needed for wheezing or shortness of breath.  . [DISCONTINUED] Budeson-Glycopyrrol-Formoterol (BREZTRI AEROSPHERE) 160-9-4.8 MCG/ACT AERO Inhale 2 puffs into the lungs 2 (two) times daily.  . Budeson-Glycopyrrol-Formoterol (BREZTRI AEROSPHERE) 160-9-4.8 MCG/ACT AERO Inhale 2 puffs into the lungs 2 (two) times daily. (Patient not taking: Reported on 07/13/2020)  . [DISCONTINUED] senna (SENOKOT) 8.6 MG TABS tablet Take 1 tablet (8.6 mg total) by mouth daily as needed for mild constipation. (Patient not taking: Reported on 07/13/2020)   No facility-administered encounter medications on file as of 07/13/2020.     Review of Systems  Review of Systems  Constitutional: Positive for fatigue. Negative for activity change, chills, fever and unexpected weight  change.  HENT: Positive for congestion, postnasal drip and rhinorrhea. Negative for sinus pressure, sinus pain and sore throat.   Eyes: Negative.   Respiratory: Positive for  cough (productive cough - clear sputum ). Negative for shortness of breath and wheezing.   Cardiovascular: Negative for chest pain and palpitations.  Gastrointestinal: Negative for constipation, diarrhea, nausea and vomiting.  Endocrine: Negative.   Genitourinary: Negative.   Musculoskeletal: Negative.   Skin: Negative.   Neurological: Negative for dizziness and headaches.  Psychiatric/Behavioral: Negative.  Negative for dysphoric mood. The patient is not nervous/anxious.   All other systems reviewed and are negative.    Physical Exam  BP (!) 132/74 (BP Location: Left Arm, Cuff Size: Normal)   Pulse 50   Temp 98.2 F (36.8 C) (Oral)   Ht 5\' 9"  (1.753 m)   Wt (!) 213 lb 9.6 oz (96.9 kg)   SpO2 96% Comment: Room air  BMI 31.54 kg/m   Wt Readings from Last 5 Encounters:  07/13/20 (!) 213 lb 9.6 oz (96.9 kg)  04/10/20 203 lb 12.8 oz (92.4 kg)  01/06/20 200 lb (90.7 kg)  11/13/19 194 lb (88 kg)  09/15/19 185 lb (83.9 kg)    BMI Readings from Last 5 Encounters:  07/13/20 31.54 kg/m  04/10/20 30.10 kg/m  01/06/20 29.53 kg/m  11/13/19 28.65 kg/m  09/15/19 27.31 kg/m     Physical Exam Vitals and nursing note reviewed.  Constitutional:      General: He is not in acute distress.    Appearance: Normal appearance. He is obese.  HENT:     Head: Normocephalic and atraumatic.     Right Ear: Hearing, tympanic membrane, ear canal and external ear normal. There is no impacted cerumen.     Left Ear: Hearing, tympanic membrane, ear canal and external ear normal. There is no impacted cerumen.     Nose: Rhinorrhea present. No mucosal edema.     Right Turbinates: Not enlarged.     Left Turbinates: Not enlarged.     Mouth/Throat:     Mouth: Mucous membranes are dry.     Pharynx: Oropharynx is clear. No  oropharyngeal exudate.     Comments: +pnd Eyes:     Pupils: Pupils are equal, round, and reactive to light.  Cardiovascular:     Rate and Rhythm: Normal rate and regular rhythm.     Pulses: Normal pulses.     Heart sounds: Normal heart sounds. No murmur heard.   Pulmonary:     Effort: Pulmonary effort is normal.     Breath sounds: No decreased breath sounds, wheezing or rales.     Comments: Slight rhonchi, likely due to congestion Abdominal:     General: Bowel sounds are normal. There is no distension.     Palpations: Abdomen is soft.     Tenderness: There is no abdominal tenderness.  Musculoskeletal:     Cervical back: Normal range of motion.     Right lower leg: No edema.     Left lower leg: No edema.  Lymphadenopathy:     Cervical: No cervical adenopathy.  Skin:    General: Skin is warm and dry.     Capillary Refill: Capillary refill takes less than 2 seconds.     Findings: No erythema or rash.  Neurological:     General: No focal deficit present.     Mental Status: He is alert and oriented to person, place, and time.     Motor: No weakness.     Coordination: Coordination normal.     Gait: Gait is intact. Gait normal.  Psychiatric:        Mood and Affect: Mood normal.  Behavior: Behavior normal. Behavior is cooperative.        Thought Content: Thought content normal.        Judgment: Judgment normal.       Assessment & Plan:   Obstructive sleep apnea Stopped CPAP therapy 1 year ago   Not interested in resuming   Asthma, chronic, moderate persistent vs ACOS in former smoker  Plan: Continue Symbicort 160 Refill rescue inhaler Refill albuterol nebs We will [facility] to help with medication cost Follow-up in 3 to 4 months with Dr. Melvyn Novas  Bronchiectasis without complication [facility]) Known bronchiectasis Chronic sinusitis in the past Continued cough with clear mucus Nonadherent to flutter valve  Plan: Resume flutter valve  use Continue plain Mucinex Remain hydrated, taking Mucinex with 1 glass of water Can use albuterol nebulized meds prior to flutter valve to see if this helps with sputum production and sputum clearance We will obtain chest x-ray at next follow-up in 3 to 4 months with Dr. Melvyn Novas  Medication management In donut hole Unable to afford Symbicort 160 He is unsure if he would qualify for patient assistance  Plan: [facility] to help with medication cost and patient assistance      Return in about 4 months (around 11/13/2020), or if symptoms worsen or fail to improve, for Follow up with Dr. Melvyn Novas, With Chest Xray.   [name], NP 07/13/2020   This appointment required 33 minutes of patient care (this includes precharting, chart review, review of results, face-to-face care, etc.).

## 2020-07-13 NOTE — Patient Instructions (Addendum)
You were seen today by [name], NP  for:   1. 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 Use a flutter valve 10 breaths twice a day or 4 to 5 breaths 4-5 times a day to help clear mucus out Let us know if you have cough with change in mucus color or fevers or chills.  At that point you would need an antibiotic. Maintain a healthy nutritious diet, eating whole foods Take your medications as prescribed   Okay to use plain Mucinex  Hydrate well   2. 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 Medication management  - AMB Referral to [facility]  Continue Symbicort 160 >>> 2 puffs in the morning right when you wake up, rinse out your mouth after use, 12 hours later 2 puffs, rinse after use >>> Take this daily, no matter what >>> This is not a rescue inhaler      We recommend today:  Orders Placed This Encounter  Procedures  . AMB Referral to [facility]    Referral Priority:   Routine    Referral Type:   Consultation    Referral Reason:   [facility]    Number of Visits Requested:   1   Orders Placed This Encounter  Procedures  . AMB Referral to [facility]   No orders of the defined types were placed in this encounter.   Follow Up:    Return in about 4 months (around 11/13/2020), or if symptoms worsen or fail to improve, for Follow up with Dr. Melvyn Novas, With Chest Xray.   Please do your part to reduce the spread of COVID-19:      Reduce your risk of any infection  and COVID19 by using the similar precautions used for avoiding the common cold or flu:  Marland Kitchen Wash your hands often with soap and warm water for at least 20 seconds.  If soap and water are not readily available, use an alcohol-based hand sanitizer with at least 60% alcohol.  . If coughing or sneezing, cover  your mouth and nose by coughing or sneezing into the elbow areas of your shirt or coat, into a tissue or into your sleeve (not your hands). Langley Gauss A MASK when in public  . Avoid shaking hands with others and consider head nods or verbal greetings only. . Avoid touching your eyes, nose, or mouth with unwashed hands.  . Avoid close contact with people who are sick. . Avoid places or events with large numbers of people in one location, like concerts or sporting events. . If you have some symptoms but not all symptoms, continue to monitor at home and seek medical attention if your symptoms worsen. . If you are having a medical emergency, call 911.   [place] / e-Visit: [email or web address]         [facility]: [facility]: [phone]                   [facility]: [phone]     It is flu season:   >>> Best ways to protect herself from the flu: Receive the yearly flu vaccine, practice good hand hygiene washing with soap and also using hand sanitizer when available, eat a nutritious meals, get adequate rest,  hydrate appropriately   Please contact the office if your symptoms worsen or you have concerns that you are not improving.   Thank you for choosing [name] for your healthcare, and for allowing [place] to partner with you on your healthcare journey. I am thankful to be able to provide care to you today.   Wyn Quaker FNP-C    Bronchiectasis  Bronchiectasis is a condition in which the airways in the lungs (bronchi) are damaged and widened. The condition makes it hard for the lungs to get rid of mucus, and it causes mucus to gather in the bronchi. This condition often leads to lung infections, which can make the condition worse. What are the causes? You can be born with this condition or you can develop it later in  life. Common causes of this condition include:  Cystic fibrosis.  Repeated lung infections, such as pneumonia or tuberculosis.  An object or other blockage in the lungs.  Breathing in fluid, food, or other objects (aspiration).  A problem with the immune system and lung structure that is present at birth (congenital). Sometimes the cause is not known. What are the signs or symptoms? Common symptoms of this condition include:  A daily cough that brings up mucus and lasts for more than 3 weeks.  Lung infections that happen often.  Shortness of breath and wheezing.  Weakness and fatigue. How is this diagnosed? This condition is diagnosed with tests, such as:  Chest X-rays or CT scans. These are done to check for changes in the lungs.  Breathing tests. These are done to check how well your lungs are working.  A test of a sample of your saliva (sputum culture). This test is done to check for infection.  Blood tests and other tests. These are done to check for related diseases or causes. How is this treated? Treatment for this condition depends on the severity of the illness and its cause. Treatment may include:  Medicines that loosen mucus so it can be coughed up (expectorants).  Medicines that relax the muscles of the bronchi (bronchodilators).  Antibiotic medicines to prevent or treat infection.  Physical therapy to help clear mucus from the lungs. Techniques may include: ? Postural drainage. This is when you sit or lie in certain positions so that mucus can drain by gravity. ? Chest percussion. This involves tapping the chest or back with a cupped hand. ? Chest vibration. For this therapy, a hand or special equipment vibrates your chest and back.  Surgery to remove the affected part of the lung. This may be done in severe cases. Follow these instructions at home: Medicines  Take over-the-counter and prescription medicines only as told by your health care  provider.  If you were prescribed an antibiotic medicine, take it as told by your health care provider. Do not stop taking the antibiotic even if you start to feel better.  Avoid taking sedatives and antihistamines unless your health care provider tells you to take them. These medicines tend to thicken the mucus in the lungs. Managing symptoms  Perform breathing exercises or techniques to clear your lungs as told by your health care provider.  Consider using a cold steam vaporizer or humidifier in your room or home to help loosen secretions.  If you have a cough that gets worse at night, try sleeping in a semi-upright position. General instructions  Get plenty of rest.  Drink enough fluid to keep your urine clear or pale yellow.  Stay inside  when pollution and ozone levels are high.  Stay up to date with vaccinations and immunizations.  Avoid cigarette smoke and other lung irritants.  Do not use any products that contain nicotine or tobacco, such as cigarettes and e-cigarettes. If you need help quitting, ask your health care provider.  Keep all follow-up visits as told by your health care provider. This is important. Contact a health care provider if:  You cough up more sputum than before and the sputum is yellow or green in color.  You have a fever.  You cannot control your cough and are losing sleep. Get help right away if:  You cough up blood.  You have chest pain.  You have increasing shortness of breath.  You have pain that gets worse or is not controlled with medicines.  You have a fever and your symptoms suddenly get worse. Summary  Bronchiectasis is a condition in which the airways in the lungs (bronchi) are damaged and widened. The condition makes it hard for the lungs to get rid of mucus, and it causes mucus to gather in the bronchi.  Treatment usually includes therapy to help clear mucus from the lungs.  Stay up to date with vaccinations and  immunizations. This information is not intended to replace advice given to you by your health care provider. Make sure you discuss any questions you have with your health care provider. Document Revised: 11/17/2017 Document Reviewed: 01/09/2017 Elsevier Patient Education  2020 [employer].

## 2020-07-13 NOTE — Assessment & Plan Note (Signed)
Known bronchiectasis Chronic sinusitis in the past Continued cough with clear mucus Nonadherent to flutter valve  Plan: Resume flutter valve use Continue plain Mucinex Remain hydrated, taking Mucinex with 1 glass of water Can use albuterol nebulized meds prior to flutter valve to see if this helps with sputum production and sputum clearance We will obtain chest x-ray at next follow-up in 3 to 4 months with Dr. Melvyn Novas

## 2020-07-17 ENCOUNTER — Encounter: Payer: Self-pay | Admitting: *Deleted

## 2020-07-17 ENCOUNTER — Other Ambulatory Visit: Payer: Self-pay | Admitting: *Deleted

## 2020-07-17 NOTE — Patient Outreach (Signed)
[facility]) Care Management [facility] CM Telephone Outreach, screening, new referral [place] Coordination x 1   07/17/2020  Mario [name] Jun 28, 1946 [id number]  Successful incoming telephone outreach from Mario [name], 74 y/o male referred to [facility] RN CM by PCP office for medication assistance with inhalers/ patient in donut hole; patient has had no recent hospitalizations or ED visits.  According to referral notes, patient's PCP office has embedded pharmacist at practice.  Patient has history including, but not limited to, COPD/ asthma; hx OSA- now resolved per patient report; HTN/ HLD; paroxysmal A-Fib; obesity  Patient reports he was returning my call from a few minutes ago; HIPAA/ identity verified and purpose of call discussed with patient; patient agreeable to complete screening call today.  Patient confirms that he is independent in ADL/ iADL's; still drives and works part time.  He denies care coordination/ disease management needs and reports his only current need is to have help to see if he might qualify for patient assistance programs in affording his medications for symbicort and Eliquis.  Screening completed along with SDOH assessment- no needs or concerns identified  [facility] CM Telephone Outreach Care Coordination 2:50 pm:  Contacted patient's PCP office via 3-way call with patient on line and left detailed voice message around patient's request for assistance with determining his eligibility for patient assistance with Eliquis and Symbicort inhaler; requested that PCP office team facilitate follow up with patient.  Provided information to patient around PCP embedded pharmacist Mario [name]) and encouraged him to follow up with PCP office if indicated; also made patient aware that I would send today's notes to PCP office team and make referral via EHR as well.  Patient appreciative of call today; encouraged him to contact [facility] CM  services in the future if needs arise and he is agreeable.  Plan:  Will make patient inactive with [facility] CM services, as no care management/ disease management needs were identified as a result of screening call today  Will place formal request through EHR for Pharmacist embedded at PCP office to follow up with patient to determine his eligibility for patient assistance with medications  ----- Message ----- From: Mario Royalty, RN Sent: 07/17/2020   3:21 PM EDT To: Mario Frizzle, MD, Mario Rinne, NP, * Subject: Please call patient to assess for financial *  Mario Royalty, RN  Mario [name], Mario [name]; Mario Frizzle, MD Cc: Mario Rinne, NP  Mario [name],  Received a referral from pulmonology Mario [name]) earlier this week to contact patient around his request for patient assistance with Symbicort inhlaer and Eliquis  Spoke with patient today-- he reports being in donut hole and would like to see if he qualifies for patient assistance  I did a screening call with the patient and found no care coordination/ care management/ disease management needs.  [place] team is now centralized, and I was advised to send the patient's request/ referral to you since you are embedded at his PCP office.  Of note, I also placed a call to the triage RN for Mario [name], making her aware of same. (left VM msg)  Patient states he is available after 1:00 pm, as he continues to work part time hours.  Please let me know if you have any questions, and thank you  Mario Rack, RN, BSN, [place] Coordinator [name] Care Management  [phone]     Mario Rack, RN,  BSN, [place] Care Management  [phone]

## 2020-07-17 NOTE — Patient Outreach (Signed)
[facility]) Care Management [facility] CM Telephone Outreach, routine MD referral/ medication assistance- new patient Unsuccessful outreach attempt # 1, new referral 07/17/2020  [name] 1946-08-13 [id number]  Unsuccessful initial telephone outreach attempt to [name], 74 y/o male referred to [facility] RN CM by PCP office for medication assistance with inhalers/ patient in donut hole; patient has had no recent hospitalizations or ED visits.  According to referral notes, patient's PCP office has embedded pharmacist at practice.  Patient has history including, but not limited to, COPD/ asthma; OSA; HTN/ HLD; paroxysmal A-Fib; obesity  HIPAA compliant voice mail message left for patient, requesting return call back.  Plan:  Will place [facility] CM unsuccessful patient outreach letter in mail requesting call back in writing  Will re-attempt [facility] CM telephone outreach within 4 business days if I do not hear back from patient first  [name], RN, BSN, [employer] Coordinator [facility] Care Management  [phone]

## 2020-07-18 ENCOUNTER — Other Ambulatory Visit: Payer: Self-pay | Admitting: Family Medicine

## 2020-07-20 ENCOUNTER — Telehealth: Payer: Self-pay

## 2020-07-20 NOTE — Telephone Encounter (Signed)
The Care Management Nurse/RN called from [place] [phone] She wants you to contact Mr. Burgo to arrange his medication assistance with affording  Eliquis & Symbicort

## 2020-07-22 ENCOUNTER — Other Ambulatory Visit: Payer: Self-pay

## 2020-07-22 MED ORDER — MONTELUKAST SODIUM 10 MG PO TABS: 10.0000 mg | ORAL_TABLET | Freq: Every day | ORAL | 1 refills | Status: DC

## 2020-08-29 ENCOUNTER — Other Ambulatory Visit: Payer: Self-pay | Admitting: Family Medicine

## 2020-09-08 ENCOUNTER — Other Ambulatory Visit: Payer: Medicare HMO

## 2020-09-08 DIAGNOSIS — J479 Bronchiectasis, uncomplicated: Secondary | ICD-10-CM

## 2020-09-09 NOTE — Telephone Encounter (Signed)
Doxy 100 mg bid ac with glass of water x 10 days and call if mucus does not change back to clear.

## 2020-09-09 NOTE — Telephone Encounter (Signed)
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 Melvyn Novas, With Chest Xray. You were seen today by [name], NP  for:   1. 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 Use a flutter valve 10 breaths twice a day or 4 to 5 breaths 4-5 times a day to help clear mucus out Let us know if you have cough with change in mucus color or fevers or chills.  At that point you would need an antibiotic. Maintain a healthy nutritious diet, eating whole foods Take your medications as prescribed   Okay to use plain Mucinex  Hydrate well   2. 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 Medication management  - AMB Referral to [facility] Care Management  Continue Symbicort 160 >>> 2 puffs in the morning right when you wake up, rinse out your mouth after use, 12 hours later 2 puffs, rinse after use >>> Take this daily, no matter what >>> This is not a rescue inhaler

## 2020-09-14 MED ORDER — DOXYCYCLINE HYCLATE 100 MG PO TABS
100.0000 mg | ORAL_TABLET | Freq: Two times a day (BID) | ORAL | 0 refills | Status: DC
Start: 2020-09-14 — End: 2021-03-15

## 2020-09-14 NOTE — Telephone Encounter (Signed)
Order placed

## 2020-09-15 ENCOUNTER — Telehealth: Payer: Self-pay | Admitting: Internal Medicine

## 2020-09-15 NOTE — Telephone Encounter (Signed)
Spoke with patient regarding prior message regarding Sputum culture . Patient stated he just got his doxycycline .Advised patient to contact our office back once done with medication to let us know symptoms improve. Will keep chart open .

## 2020-09-16 ENCOUNTER — Other Ambulatory Visit: Payer: Self-pay | Admitting: Pulmonary Disease

## 2020-09-16 MED ORDER — NYSTATIN 100000 UNIT/ML MT SUSP: 5.0000 mL | Freq: Four times a day (QID) | OROMUCOSAL | 0 refills | Status: DC

## 2020-09-17 ENCOUNTER — Telehealth: Payer: Self-pay | Admitting: Pulmonary Disease

## 2020-09-17 ENCOUNTER — Other Ambulatory Visit: Payer: Self-pay | Admitting: Pulmonary Disease

## 2020-09-17 NOTE — Telephone Encounter (Signed)
Spoke with patient, Mario [name] had called regarding sputum results, he states he was already called about that and received the doxycycline.  He inquired about the nystatin, advised that was prescribed for a yeast infection in his sputum.  Patient to pick up medication.  Nothing further needed.

## 2020-09-27 ENCOUNTER — Other Ambulatory Visit: Payer: Self-pay | Admitting: Family Medicine

## 2020-10-16 ENCOUNTER — Other Ambulatory Visit: Payer: Self-pay | Admitting: Family Medicine

## 2020-10-20 ENCOUNTER — Ambulatory Visit: Payer: Medicare HMO | Admitting: Internal Medicine

## 2020-10-21 ENCOUNTER — Other Ambulatory Visit: Payer: Self-pay | Admitting: Family Medicine

## 2020-10-23 LAB — MYCOBACTERIA,CULT W/FLUOROCHROME SMEAR
MICRO NUMBER:: 10976494
SMEAR:: NONE SEEN
SPECIMEN QUALITY:: ADEQUATE

## 2020-10-23 LAB — FUNGUS CULTURE W SMEAR
MICRO NUMBER:: 10976493
SMEAR:: NONE SEEN
SPECIMEN QUALITY:: ADEQUATE

## 2020-10-23 LAB — RESPIRATORY CULTURE OR RESPIRATORY AND SPUTUM CULTURE
MICRO NUMBER:: 10976495
SPECIMEN QUALITY:: ADEQUATE

## 2020-11-14 ENCOUNTER — Other Ambulatory Visit: Payer: Self-pay | Admitting: Family Medicine

## 2020-11-15 ENCOUNTER — Other Ambulatory Visit: Payer: Self-pay | Admitting: Family Medicine

## 2020-11-17 ENCOUNTER — Ambulatory Visit: Payer: Medicare HMO | Admitting: Internal Medicine

## 2020-11-17 ENCOUNTER — Ambulatory Visit: Payer: Medicare HMO | Admitting: Pulmonary Disease

## 2020-11-18 DIAGNOSIS — U071 COVID-19: Secondary | ICD-10-CM | POA: Diagnosis not present

## 2020-11-18 DIAGNOSIS — Z20822 Contact with and (suspected) exposure to covid-19: Secondary | ICD-10-CM | POA: Diagnosis not present

## 2020-11-19 ENCOUNTER — Telehealth: Payer: Self-pay | Admitting: Family Medicine

## 2020-11-19 ENCOUNTER — Other Ambulatory Visit (HOSPITAL_COMMUNITY): Payer: Self-pay

## 2020-11-19 ENCOUNTER — Other Ambulatory Visit: Payer: Self-pay | Admitting: Nurse Practitioner

## 2020-11-19 ENCOUNTER — Telehealth: Payer: Self-pay | Admitting: Internal Medicine

## 2020-11-19 DIAGNOSIS — U071 COVID-19: Secondary | ICD-10-CM

## 2020-11-19 NOTE — Telephone Encounter (Signed)
Called spoke with patient.  Let them know I would send message to Dr. Melvyn Novas for the infusion recommendations, and we would let him know if the appointment on 11/30/20 needed to be changed to a televisit. As this would be out of the 10 days window for non-hospitalized patients.    Dr. Melvyn Novas please advise on antibody infusion and if appointment on 13th needs to be changed to televisit

## 2020-11-19 NOTE — Telephone Encounter (Signed)
Absolutely ASAP Moving forward I will agree to monoclonal antibody infusion clinic for any of my patients.

## 2020-11-19 NOTE — Telephone Encounter (Signed)
Called and spoke with patient to let him know that he can come into the office for his visit on 12/13 as he would be outside the 10 day window. Advised patient to get infusion tomorrow and that if he goes into the hospital at all his appointment would have to be changed and that if he was having more symptoms or feeling more fatigued around the time of his visit that it can then be switched or rescheduled. He expressed understanding. Nothing further needed at this time.

## 2020-11-19 NOTE — Telephone Encounter (Signed)
Ok to both - secure chat [facility] to see if they know about him and they will call to arrange if he qualifies and if so I would rec he take it

## 2020-11-19 NOTE — Telephone Encounter (Signed)
Was tested positive for covid on 11/18/2020 would like to have a order for COVID Antibody Infusion

## 2020-11-19 NOTE — Telephone Encounter (Signed)
ATC patient to follow up with Dr. Gustavus Bryant recs. Looks like patient is already set up to get infusion tomorrow. Left VM for patient  Mario [name]- This patient tested positive yesterday and is getting infusion tomorrow is set up for an appointment with you on 12/13 thats past the 10 day mark but wants to know if this needs to be a televisit?

## 2020-11-19 NOTE — Telephone Encounter (Signed)
Ok to refer to Infusion Clinic?

## 2020-11-19 NOTE — Telephone Encounter (Signed)
Ok Thank You [name].

## 2020-11-19 NOTE — Telephone Encounter (Signed)
As long as he does not require hospitalization in the mid spine.  To be seen in that timeframe.  Agree with him proceeding forward with obtaining the monoclonal antibody infusion.  If symptomatically he is not doing well or is more fatigued he can always delay this follow-up for a few days.Wyn Quaker, FNP

## 2020-11-19 NOTE — Telephone Encounter (Signed)
Referral orders placed

## 2020-11-19 NOTE — Progress Notes (Signed)
I connected by phone with Mario [name] on 11/19/2020 at 12:35 PM to discuss the potential use of an new treatment for mild to moderate COVID-19 viral infection in non-hospitalized patients.  This patient is a 74 y.o. male that meets the FDA criteria for Emergency Use Authorization of casirivimab\imdevimab.  Has a (+) direct SARS-CoV-2 viral test result  Has mild or moderate COVID-19   Is ? 75 years of age and weighs ? 40 kg  Is NOT hospitalized due to COVID-19  Is NOT requiring oxygen therapy or requiring an increase in baseline oxygen flow rate due to COVID-19  Is within 10 days of symptom onset  Has at least one of the high risk factor(s) for progression to severe COVID-19 and/or hospitalization as defined in EUA.  Specific high risk criteria : Older age (>/= 75 yo), BMI > 25, Cardiovascular disease or hypertension and Chronic Lung Disease   I have spoken and communicated the following to the patient or parent/caregiver:  1. FDA has authorized the emergency use of casirivimab\imdevimab for the treatment of mild to moderate COVID-19 in adults and pediatric patients with positive results of direct SARS-CoV-2 viral testing who are 48 years of age and older weighing at least 40 kg, and who are at high risk for progressing to severe COVID-19 and/or hospitalization.  2. The significant known and potential risks and benefits of casirivimab\imdevimab, and the extent to which such potential risks and benefits are unknown.  3. Information on available alternative treatments and the risks and benefits of those alternatives, including clinical trials.  4. Patients treated with casirivimab\imdevimab should continue to self-isolate and use infection control measures (e.g., wear mask, isolate, social distance, avoid sharing personal items, clean and disinfect "high touch" surfaces, and frequent handwashing) according to CDC guidelines.   5. The patient or parent/caregiver has the option to accept  or refuse casirivimab\imdevimab .  After reviewing this information with the patient, the patient has agreed to receive one of the available covid 19 monoclonal antibodies and will be provided an appropriate fact sheet prior to infusion.[name], [name], AGNP-C [phone] ([place])

## 2020-11-20 ENCOUNTER — Telehealth: Payer: Self-pay

## 2020-11-20 ENCOUNTER — Telehealth: Payer: Self-pay | Admitting: Internal Medicine

## 2020-11-20 ENCOUNTER — Ambulatory Visit (HOSPITAL_COMMUNITY)
Admission: RE | Admit: 2020-11-20 | Discharge: 2020-11-20 | Disposition: A | Discharge status: Home / Self Care | Payer: Medicare HMO | Source: Ambulatory Visit | Attending: Pulmonary Disease | Admitting: Pulmonary Disease

## 2020-11-20 DIAGNOSIS — Z23 Encounter for immunization: Secondary | ICD-10-CM | POA: Diagnosis not present

## 2020-11-20 DIAGNOSIS — U071 COVID-19: Secondary | ICD-10-CM

## 2020-11-20 MED ORDER — DIPHENHYDRAMINE HCL 50 MG/ML IJ SOLN: 50.0000 mg | Freq: Once | INTRAMUSCULAR | Status: DC | PRN

## 2020-11-20 MED ORDER — EPINEPHRINE 0.3 MG/0.3ML IJ SOAJ: 0.3000 mg | Freq: Once | INTRAMUSCULAR | Status: DC | PRN

## 2020-11-20 MED ORDER — METHYLPREDNISOLONE SODIUM SUCC 125 MG IJ SOLR: 125.0000 mg | Freq: Once | INTRAMUSCULAR | Status: DC | PRN

## 2020-11-20 MED ORDER — AZITHROMYCIN 250 MG PO TABS: ORAL_TABLET | ORAL | 0 refills | Status: DC

## 2020-11-20 MED ORDER — SODIUM CHLORIDE 0.9 % IV SOLN: INTRAVENOUS | Status: DC | PRN

## 2020-11-20 MED ORDER — ALBUTEROL SULFATE HFA 108 (90 BASE) MCG/ACT IN AERS: 2.0000 | INHALATION_SPRAY | Freq: Once | RESPIRATORY_TRACT | Status: DC | PRN

## 2020-11-20 MED ORDER — FAMOTIDINE IN NACL 20-0.9 MG/50ML-% IV SOLN: 20.0000 mg | Freq: Once | INTRAVENOUS | Status: DC | PRN

## 2020-11-20 MED ORDER — SOTROVIMAB 500 MG/8ML IV SOLN
500.0000 mg | Freq: Once | INTRAVENOUS | Status: AC
  Administered 2020-11-20: 500 mg via INTRAVENOUS

## 2020-11-20 NOTE — Telephone Encounter (Signed)
What antibiotic is he one and how may days has he taken in it  - I can't find the entry but if it's doxy or zpak it is fine.

## 2020-11-20 NOTE — Progress Notes (Signed)
Patient reviewed Fact Sheet for Patients, Parents, and Caregivers for Emergency Use Authorization (EUA) of Sotrovimab for the Treatment of Coronavirus. Patient also reviewed and is agreeable to the estimated cost of treatment. Patient is agreeable to proceed.   

## 2020-11-20 NOTE — Telephone Encounter (Signed)
Called spoke with patient. States he is still having very dark green mucus. Wants to know about getting a strong antibiotic.  Patient is going to antibody infusion this afternoon   Dr. Melvyn Novas please advise.

## 2020-11-20 NOTE — Progress Notes (Signed)
Diagnosis: COVID-19  Physician: Dr. Patrick Wright  Procedure: Covid Infusion Clinic Med: Sotrovimab infusion - Provided patient with sotrovimab fact sheet for patients, parents, and caregivers prior to infusion.   Complications: No immediate complications noted  Discharge: Discharged home    

## 2020-11-20 NOTE — Discharge Instructions (Signed)
10 Things You Can Do to Manage Your COVID-19 Symptoms at Home If you have possible or confirmed COVID-19: 1. Stay home from work and school. And stay away from other public places. If you must go out, avoid using any kind of public transportation, ridesharing, or taxis. 2. Monitor your symptoms carefully. If your symptoms get worse, call your healthcare provider immediately. 3. Get rest and stay hydrated. 4. If you have a medical appointment, call the healthcare provider ahead of time and tell them that you have or may have COVID-19. 5. For medical emergencies, call 911 and notify the dispatch personnel that you have or may have COVID-19. 6. Cover your cough and sneezes with a tissue or use the inside of your elbow. 7. Wash your hands often with soap and water for at least 20 seconds or clean your hands with an alcohol-based hand sanitizer that contains at least 60% alcohol. 8. As much as possible, stay in a specific room and away from other people in your home. Also, you should use a separate bathroom, if available. If you need to be around other people in or outside of the home, wear a mask. 9. Avoid sharing personal items with other people in your household, like dishes, towels, and bedding. 10. Clean all surfaces that are touched often, like counters, tabletops, and doorknobs. Use household cleaning sprays or wipes according to the label instructions. cdc.gov/coronavirus 06/19/2019 This information is not intended to replace advice given to you by your health care provider. Make sure you discuss any questions you have with your health care provider. Document Revised: 11/21/2019 Document Reviewed: 11/21/2019 Elsevier Patient Education  2020 Elsevier Inc. What types of side effects do monoclonal antibody drugs cause?  Common side effects  In general, the more common side effects caused by monoclonal antibody drugs include: . Allergic reactions, such as hives or itching . Flu-like signs and  symptoms, including chills, fatigue, fever, and muscle aches and pains . Nausea, vomiting . Diarrhea . Skin rashes . Low blood pressure   The CDC is recommending patients who receive monoclonal antibody treatments wait at least 90 days before being vaccinated.  Currently, there are no data on the safety and efficacy of mRNA COVID-19 vaccines in persons who received monoclonal antibodies or convalescent plasma as part of COVID-19 treatment. Based on the estimated half-life of such therapies as well as evidence suggesting that reinfection is uncommon in the 90 days after initial infection, vaccination should be deferred for at least 90 days, as a precautionary measure until additional information becomes available, to avoid interference of the antibody treatment with vaccine-induced immune responses. If you have any questions or concerns after the infusion please call the Advanced Practice Provider on call at [phone]. This number is ONLY intended for your use regarding questions or concerns about the infusion post-treatment side-effects.  Please do not provide this number to others for use. For return to work notes please contact your primary care provider.   If someone you know is interested in receiving treatment please have them call the COVID hotline at [phone].   

## 2020-11-20 NOTE — Telephone Encounter (Signed)
Pt aware of recs. zpak sent to preferred pharmacy.  Nothing further needed at this time- will close encounter.

## 2020-11-20 NOTE — Telephone Encounter (Signed)
Sorry, misread the note  zpak is usually what we've used in this setting and should gradually turn the mucus more clear

## 2020-11-29 ENCOUNTER — Other Ambulatory Visit: Payer: Self-pay | Admitting: Family Medicine

## 2020-11-30 ENCOUNTER — Ambulatory Visit: Payer: Medicare HMO | Admitting: Pulmonary Disease

## 2020-12-14 ENCOUNTER — Ambulatory Visit: Payer: Medicare HMO | Admitting: Pulmonary Disease

## 2020-12-14 ENCOUNTER — Ambulatory Visit (INDEPENDENT_AMBULATORY_CARE_PROVIDER_SITE_OTHER): Payer: Medicare HMO

## 2020-12-14 ENCOUNTER — Other Ambulatory Visit: Payer: Self-pay

## 2020-12-14 ENCOUNTER — Encounter: Payer: Self-pay | Admitting: Pulmonary Disease

## 2020-12-14 VITALS — BP 114/74 | HR 62 | Temp 98.0°F | Ht 69.0 in | Wt 219.4 lb

## 2020-12-14 DIAGNOSIS — J90 Pleural effusion, not elsewhere classified: Secondary | ICD-10-CM | POA: Diagnosis not present

## 2020-12-14 DIAGNOSIS — J479 Bronchiectasis, uncomplicated: Secondary | ICD-10-CM | POA: Diagnosis not present

## 2020-12-14 DIAGNOSIS — Z8616 Personal history of COVID-19: Secondary | ICD-10-CM | POA: Diagnosis not present

## 2020-12-14 DIAGNOSIS — J4540 Moderate persistent asthma, uncomplicated: Secondary | ICD-10-CM | POA: Diagnosis not present

## 2020-12-14 DIAGNOSIS — Z0000 Encounter for general adult medical examination without abnormal findings: Secondary | ICD-10-CM | POA: Insufficient documentation

## 2020-12-14 NOTE — Assessment & Plan Note (Signed)
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

## 2020-12-14 NOTE — Assessment & Plan Note (Signed)
Tested positive for COVID-19 on 11/18/2020 Received monoclonal antibody infusion Did not require hospitalization Had received first 2 COVID-19 vaccinations, without booster   Plan: Obtain COVID-19 booster vaccination after 90 days after monoclonal antibody infusion Chest x-ray today

## 2020-12-14 NOTE — Assessment & Plan Note (Signed)
High-dose flu vaccine today 

## 2020-12-14 NOTE — Patient Instructions (Addendum)
You were seen today by Coral Ceo, NP  for:   1. 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 Use a flutter valve 10 breaths twice a day or 4 to 5 breaths 4-5 times a day to help clear mucus out Let us know if you have cough with change in mucus color or fevers or chills.  At that point you would need an antibiotic. Maintain a healthy nutritious diet, eating whole foods Take your medications as prescribed   2. Asthma, chronic, moderate persistent vs ACOS in former smoker   Continue Symbicort 160 >>> 2 puffs in the morning right when you wake up, rinse out your mouth after use, 12 hours later 2 puffs, rinse after use >>> Take this daily, no matter what >>> This is not a rescue inhaler   High dose flu vaccine today   3. History of COVID-19  - DG Chest 2 View; Future   We recommend today:  Orders Placed This Encounter  Procedures   DG Chest 2 View    Standing Status:   Future    Number of Occurrences:   1    Standing Expiration Date:   04/14/2021    Order Specific Question:   Reason for Exam (SYMPTOM  OR DIAGNOSIS REQUIRED)    Answer:   12/21 - [id number]    Order Specific Question:   Preferred imaging location?    Answer:   Internal    Order Specific Question:   Radiology Contrast Protocol - do NOT remove file path    Answer:   [email or web address]   Orders Placed This Encounter  Procedures   DG Chest 2 View   No orders of the defined types were placed in this encounter.   Follow Up:    Return in about 3 months (around 03/14/2021), or if symptoms worsen or fail to improve, for Follow up with Dr. Sherene Sires.   Notification of test results are managed in the following manner: If there are  any recommendations or changes to the  plan of care discussed in office today,  we will  contact you and let you know what they are. If you do not hear from [place], then your results are normal and you can view them through your  MyChart account , or a letter will be sent to you. Thank you again for trusting [place] with your care  - Thank you, [facility]    It is flu season:   >>> Best ways to protect herself from the flu: Receive the yearly flu vaccine, practice good hand hygiene washing with soap and also using hand sanitizer when available, eat a nutritious meals, get adequate rest, hydrate appropriately       Please contact the office if your symptoms worsen or you have concerns that you are not improving.   Thank you for choosing [facility] for your healthcare, and for allowing [place] to partner with you on your healthcare journey. I am thankful to be able to provide care to you today.   Elisha Headland FNP-C   Influenza Virus Vaccine injection What is this medicine? INFLUENZA VIRUS VACCINE (in floo EN zuh VAHY ruhs vak SEEN) helps to reduce the risk of getting influenza also known as the flu. The vaccine only helps protect you against some strains of the flu. This medicine may be used for other purposes; ask your health care  provider or pharmacist if you have questions. COMMON BRAND NAME(S): Afluria, Afluria Quadrivalent, Agriflu, Alfuria, FLUAD, Fluarix, Fluarix Quadrivalent, Flublok, Flublok Quadrivalent, FLUCELVAX, FLUCELVAX Quadrivalent, Flulaval, Flulaval Quadrivalent, Fluvirin, Fluzone, Fluzone High-Dose, Fluzone Intradermal, Fluzone Quadrivalent What should I tell my health care provider before I take this medicine? They need to know if you have any of these conditions:  bleeding disorder like hemophilia  fever or infection  Guillain-Barre syndrome or other neurological problems  immune system problems  infection with the human immunodeficiency virus (HIV) or AIDS  low blood platelet counts  multiple sclerosis  an unusual or allergic reaction to  influenza virus vaccine, latex, other medicines, foods, dyes, or preservatives. Different brands of vaccines contain different allergens. Some may contain latex or eggs. Talk to your doctor about your allergies to make sure that you get the right vaccine.  pregnant or trying to get pregnant  breast-feeding How should I use this medicine? This vaccine is for injection into a muscle or under the skin. It is given by a health care professional. A copy of Vaccine Information Statements will be given before each vaccination. Read this sheet carefully each time. The sheet may change frequently. Talk to your healthcare provider to see which vaccines are right for you. Some vaccines should not be used in all age groups. Overdosage: If you think you have taken too much of this medicine contact a poison control center or emergency room at once. NOTE: This medicine is only for you. Do not share this medicine with others. What if I miss a dose? This does not apply. What may interact with this medicine?  chemotherapy or radiation therapy  medicines that lower your immune system like etanercept, anakinra, infliximab, and adalimumab  medicines that treat or prevent blood clots like warfarin  phenytoin  steroid medicines like prednisone or cortisone  theophylline  vaccines This list may not describe all possible interactions. Give your health care provider a list of all the medicines, herbs, non-prescription drugs, or dietary supplements you use. Also tell them if you smoke, drink alcohol, or use illegal drugs. Some items may interact with your medicine. What should I watch for while using this medicine? Report any side effects that do not go away within 3 days to your doctor or health care professional. Call your health care provider if any unusual symptoms occur within 6 weeks of receiving this vaccine. You may still catch the flu, but the illness is not usually as bad. You cannot get the flu from  the vaccine. The vaccine will not protect against colds or other illnesses that may cause fever. The vaccine is needed every year. What side effects may I notice from receiving this medicine? Side effects that you should report to your doctor or health care professional as soon as possible:  allergic reactions like skin rash, itching or hives, swelling of the face, lips, or tongue Side effects that usually do not require medical attention (report to your doctor or health care professional if they continue or are bothersome):  fever  headache  muscle aches and pains  pain, tenderness, redness, or swelling at the injection site  tiredness This list may not describe all possible side effects. Call your doctor for medical advice about side effects. You may report side effects to FDA at 1-800-FDA-1088. Where should I keep my medicine? The vaccine will be given by a health care professional in a clinic, pharmacy, doctor's office, or other health care setting. You will not be given vaccine  doses to store at home. NOTE: This sheet is a summary. It may not cover all possible information. If you have questions about this medicine, talk to your doctor, pharmacist, or health care provider.  2020 Elsevier/Gold Standard (2018-10-30 08:45:43)

## 2020-12-14 NOTE — Assessment & Plan Note (Signed)
Plan: °Continue Symbicort 160 °Continue rescue inhaler °

## 2020-12-14 NOTE — Progress Notes (Signed)
@Patient  ID: , male    DOB: 12-17-1946, 74 y.o.   MRN: [id number]  Chief Complaint  Patient presents with  . Follow-up    Bronchiectasis, doing well, had Covid-19 on Nov 18 2020.    Referring provider: 09-05-1977, MD  HPI:  74 year old male former smoker followed in our office for COPD, bronchiectasis, chronic cough  PMH: Hypertension, hyperlipidemia, CAD Smoker/ Smoking History: Former smoker.  Quit 1976.  22-pack-year smoking history. Maintenance:  Symbicort 160 Pt of: Dr. 1977  12/14/2020  - Visit   74 year old male former smoker followed in our office for COPD, bronchiectasis and chronic cough.  Since last being seen patient is also contracted COVID-19 in December/2021.  Patient received the monoclonal antibody infusion.  Patient was able to be maintained in the outpatient setting.  Patient reports that overall has been doing well since last being seen.  Patient reports adherence to Symbicort 160.  Patient was vaccinated first 2 doses of COVID-19 he was waiting to receive his booster vaccination when he tested positive.  Patient received monoclonal antibody infusion.  He is waiting to receive his COVID-19 booster vaccination 90 days post the monoclonal antibody infusion.  He remains adherent to using his flutter valve.  He occasionally has to use his rescue inhaler.  He has no current respiratory complaints today.  Patient would like to receive his high-dose flu vaccine.  Tests:   09/19/2018-CT chest high-res-mild cylindrical and varicoid bronchiectasis in the mid to lower lungs, unchanged, associated with mild bronchial wall thickening slightly improved, stable scattered parenchymal bands in the mid to lower lungs, 29-month stability of 5 mm apical right upper lobe solid pulmonary nodule, more likely benign, suggest follow-up CT chest in 6 to 12 months  09/13/2019-chest x-ray-chronic bibasilar scarring and bronchiectasis, no new acute or cardiopulmonary  findings  08/13/2018-pulmonary function test-FVC 2.99 (73% predicted), ratio 62, FEV1 1.85 (63% predicted), TLC 5.95 (88% predicted), DLCO 23 (75% predicted)  FENO:  No results found for: NITRICOXIDE  PFT: PFT Results Latest Ref Rng & Units 08/13/2018  FVC-Pre L 2.99  FVC-Predicted Pre % 73  Pre FEV1/FVC % % 62  FEV1-Pre L 1.85  FEV1-Predicted Pre % 63  DLCO uncorrected ml/min/mmHg 23.00  DLCO UNC% % 75  DLCO corrected ml/min/mmHg 22.69  DLCO COR %Predicted % 74  DLVA Predicted % 108  TLC L 5.95  TLC % Predicted % 88  RV % Predicted % 120    WALK:  No flowsheet data found.  Imaging: DG Chest 2 View  Result Date: 12/14/2020 CLINICAL DATA:  COVID-19 positive. EXAM: CHEST - 2 VIEW COMPARISON:  September 13, 2019. FINDINGS: Stable cardiomediastinal silhouette. No pneumothorax or pleural effusion is noted. Mild patchy opacities are noted in both lung bases which may represent subsegmental atelectasis or possibly infiltrates. Bony thorax is unremarkable. IMPRESSION: Mild bibasilar subsegmental atelectasis or possibly infiltrates. Electronically Signed   By: September 15, 2019 M.D.   On: 12/14/2020 16:57    Lab Results:  CBC    Component Value Date/Time   WBC 19.1 (H) 09/16/2019 0341   RBC 4.33 09/16/2019 0341   HGB 13.7 09/16/2019 0341   HCT 41.7 09/16/2019 0341   PLT 255 09/16/2019 0341   MCV 96.3 09/16/2019 0341   MCH 31.6 09/16/2019 0341   MCHC 32.9 09/16/2019 0341   RDW 14.2 09/16/2019 0341   LYMPHSABS 1.1 09/16/2019 0341   MONOABS 1.3 (H) 09/16/2019 0341   EOSABS 0.0 09/16/2019 0341   BASOSABS 0.0  09/16/2019 0341    BMET    Component Value Date/Time   NA 136 09/16/2019 0341   K 4.4 09/16/2019 0341   CL 103 09/16/2019 0341   CO2 23 09/16/2019 0341   GLUCOSE 131 (H) 09/16/2019 0341   BUN 15 09/16/2019 0341   CREATININE 1.18 09/16/2019 0341   CREATININE 0.96 10/05/2018 0817   CALCIUM 8.9 09/16/2019 0341   GFRNONAA >60 09/16/2019 0341   GFRNONAA 78 10/05/2018  0817   GFRAA >60 09/16/2019 0341   GFRAA 91 10/05/2018 0817    BNP    Component Value Date/Time   BNP 38 06/12/2018 1455    ProBNP No results found for: PROBNP  Specialty Problems      Pulmonary Problems   Obstructive sleep apnea    Obstructive sleep apnea      COPD (chronic obstructive pulmonary disease) with chronic bronchitis (HCC)   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  Both mastoid sinuses are clear. Mild to moderate nasal septal deviation. - HRCT chest 09/19/2018  1. Mild cylindrical and varicoid bronchiectasis in the mid to lower lungs, unchanged. Associated mild bronchial wall thickening is slightly improved. 2. Stable scattered parenchymal bands in the mid to lower lungs, compatible with postinfectious/postinflammatory scarring. 3. Two month stability of 5 mm apical right upper lobe solid pulmonary nodule, more likely benign. Suggest follow-up chest CT in 6-12 months given the history of smoking and given that this nodule was previously seen to be new compared to 07/07/2017 chest CT. 4. Three-vessel coronary atherosclerosis. 5. Moderate hiatal hernia.      Asthma, chronic, moderate persistent vs ACOS in former smoker     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  PFT's  08/13/2018  FEV1 1.85 (63 % ) ratio 62   p symb 160/spiriva  And saba   prior to study with DLCO  75/74c % corrects to 108  % for alv volume   -  09/24/2018   try off spiriva > no worse as of 12/25/2018  - 06/27/2019   try reduce symb to 80 2bid to reduce flares of purulent tracheobronchitis > preferred symb 160 - 01/06/2020  After extensive coaching inhaler device,  effectiveness =    90% and flaring on symb 160 > try change to breztri 2bid x 2 weeks        Chronic frontal sinusitis   Chronic maxillary sinusitis   Deviated nasal septum   Chronic cough      No Known Allergies  Immunization History  Administered Date(s) Administered  . Fluad Quad(high Dose 65+) 09/09/2019  . Influenza Split 09/09/2013  . Influenza, High Dose Seasonal PF 09/22/2015, 09/27/2016, 09/27/2016, 09/11/2017  . Influenza,inj,Quad PF,6+ Mos 09/03/2018  . Influenza-Unspecified 09/18/2014  . PFIZER SARS-COV-2 Vaccination 02/05/2020, 02/26/2020  . Pneumococcal Conjugate-13 02/17/2014  .  Pneumococcal Polysaccharide-23 10/13/2009, 04/16/2015  . Tdap 03/13/2007  . Zoster 05/29/2015    Past Medical History:  Diagnosis Date  . Asthma   . Biliary colic 2014  . COPD (chronic obstructive pulmonary disease) with chronic bronchitis (HCC)   . Coronary artery calcification seen on CAT scan   . Cyst of pancreas    1 cm (7/18), repeat ct in 12 months.    . Hyperlipidemia   . Hypertension   . Obstructive sleep apnea   . Renal disorder   . Rhinitis   . Sleep apnea    pt denies sleep apnea-lost weight    Tobacco History: Social History   Tobacco Use  Smoking Status Former Smoker  . Packs/day: 2.00  . Years: 11.00  . Pack years: 22.00  . Types: Cigarettes  . Quit date: 12/19/1974  . Years since quitting: 46.0  Smokeless Tobacco Never Used   Counseling given: Not Answered   Continue to not smoke  Outpatient Encounter Medications as of 12/14/2020  Medication Sig  . albuterol (PROVENTIL) (2.5 MG/3ML) 0.083% nebulizer solution Take 3 mLs (2.5 mg total) by nebulization every 6 (six) hours as needed for wheezing or shortness of breath.  . Ascorbic Acid  (VITAMIN C) 1000 MG tablet Take 1,000 mg by mouth 2 (two) times daily.  Marland Kitchen atorvastatin (LIPITOR) 40 MG tablet Take 1 tablet (40 mg total) by mouth daily.  . Cholecalciferol (VITAMIN D3) 125 MCG (5000 UT) TABS Take by mouth.  . Coenzyme Q10 (CO Q-10) 100 MG CAPS Take 1 capsule by mouth daily.  Marland Kitchen dextromethorphan-guaiFENesin (MUCINEX DM) 30-600 MG 12hr tablet Take 1 tablet by mouth at bedtime as needed for cough.  . doxycycline (VIBRA-TABS) 100 MG tablet Take 1 tablet (100 mg total) by mouth 2 (two) times daily.  Marland Kitchen ELIQUIS 5 MG TABS tablet TAKE 1 TABLET BY MOUTH TWICE A DAY  . FIBER COMPLETE PO Take 1 tablet by mouth daily.  Marland Kitchen glucosamine-chondroitin 500-400 MG tablet Take 1 tablet by mouth 3 (three) times daily.  . hydrochlorothiazide (HYDRODIURIL) 25 MG tablet TAKE 1 TABLET BY MOUTH EVERY DAY  . losartan (COZAAR) 100 MG tablet TAKE 1 TABLET BY MOUTH EVERY DAY  . metoprolol succinate (TOPROL-XL) 25 MG 24 hr tablet TAKE 1 TABLET BY MOUTH EVERY DAY  . montelukast (SINGULAIR) 10 MG tablet Take 1 tablet (10 mg total) by mouth daily.  Marland Kitchen nystatin (MYCOSTATIN) 100000 UNIT/ML suspension Take 5 mLs (500,000 Units total) by mouth 4 (four) times daily. 42ml every 6 hours for 7 days. Please keep in mouth as long as possible  . pantoprazole (PROTONIX) 40 MG tablet TAKE 1 TABLET BY MOUTH TWICE A DAY  . PROAIR HFA 108 (90 Base) MCG/ACT inhaler TAKE 2 PUFFS BY MOUTH EVERY 6 HOURS AS NEEDED FOR WHEEZE OR SHORTNESS OF BREATH (Patient taking differently: Inhale 2 puffs into the lungs every 6 (six) hours as needed for wheezing or shortness of breath.)  . Respiratory Therapy Supplies (FLUTTER) DEVI Use as directed  . rosuvastatin (CRESTOR) 20 MG tablet TAKE 1 TABLET BY MOUTH EVERY DAY **D/C LIPITOR**  . SYMBICORT 160-4.5 MCG/ACT inhaler TAKE 2 PUFFS BY MOUTH TWICE A DAY  . therapeutic multivitamin-minerals (THERAGRAN-M) tablet Take 1 tablet by mouth daily.  Marland Kitchen UNABLE TO FIND Med Name: clear lungs otc 4 x daily  .  [DISCONTINUED] azithromycin (ZITHROMAX Z-PAK) 250 MG tablet Take 2 tabs today, then 1 tab daily until gone.  . [DISCONTINUED] Budeson-Glycopyrrol-Formoterol (BREZTRI AEROSPHERE) 160-9-4.8 MCG/ACT AERO Inhale 2  puffs into the lungs 2 (two) times daily.   No facility-administered encounter medications on file as of 12/14/2020.     Review of Systems  Review of Systems  Constitutional: Negative for activity change, chills, fatigue, fever and unexpected weight change.  HENT: Negative for postnasal drip, rhinorrhea, sinus pressure, sinus pain and sore throat.   Eyes: Negative.   Respiratory: Negative for cough, shortness of breath and wheezing.   Cardiovascular: Negative for chest pain and palpitations.  Gastrointestinal: Negative for constipation, diarrhea, nausea and vomiting.  Endocrine: Negative.   Genitourinary: Negative.   Musculoskeletal: Negative.   Skin: Negative.   Neurological: Negative for dizziness and headaches.  Psychiatric/Behavioral: Negative.  Negative for dysphoric mood. The patient is not nervous/anxious.   All other systems reviewed and are negative.    Physical Exam  BP 114/74 (BP Location: Left Arm, Cuff Size: Normal)   Pulse 62   Temp 98 F (36.7 C) (Oral)   Ht 5\' 9"  (1.753 m)   Wt 219 lb 6.4 oz (99.5 kg)   SpO2 96%   BMI 32.40 kg/m   Wt Readings from Last 5 Encounters:  12/14/20 219 lb 6.4 oz (99.5 kg)  07/13/20 (!) 213 lb 9.6 oz (96.9 kg)  04/10/20 203 lb 12.8 oz (92.4 kg)  01/06/20 200 lb (90.7 kg)  11/13/19 194 lb (88 kg)    BMI Readings from Last 5 Encounters:  12/14/20 32.40 kg/m  07/13/20 31.54 kg/m  04/10/20 30.10 kg/m  01/06/20 29.53 kg/m  11/13/19 28.65 kg/m     Physical Exam Vitals and nursing note reviewed.  Constitutional:      General: He is not in acute distress.    Appearance: Normal appearance. He is obese.  HENT:     Head: Normocephalic and atraumatic.     Right Ear: Hearing and external ear normal.     Left Ear:  Hearing and external ear normal.     Nose: Nose normal. No mucosal edema or rhinorrhea.     Right Turbinates: Not enlarged.     Left Turbinates: Not enlarged.     Mouth/Throat:     Mouth: Mucous membranes are dry.     Pharynx: Oropharynx is clear. No oropharyngeal exudate.  Eyes:     Pupils: Pupils are equal, round, and reactive to light.  Cardiovascular:     Rate and Rhythm: Normal rate and regular rhythm.     Pulses: Normal pulses.     Heart sounds: Normal heart sounds. No murmur heard.   Pulmonary:     Effort: Pulmonary effort is normal.     Breath sounds: Normal breath sounds. No decreased breath sounds, wheezing or rales.  Musculoskeletal:     Cervical back: Normal range of motion.     Right lower leg: No edema.     Left lower leg: No edema.  Lymphadenopathy:     Cervical: No cervical adenopathy.  Skin:    General: Skin is warm and dry.     Capillary Refill: Capillary refill takes less than 2 seconds.     Findings: No erythema or rash.  Neurological:     General: No focal deficit present.     Mental Status: He is alert and oriented to person, place, and time.     Motor: No weakness.     Coordination: Coordination normal.     Gait: Gait is intact. Gait normal.  Psychiatric:        Mood and Affect: Mood normal.  Behavior: Behavior normal. Behavior is cooperative.        Thought Content: Thought content normal.        Judgment: Judgment normal.       Assessment & Plan:   Bronchiectasis without complication (HCC) Known bronchiectasis Chronic sinusitis in the past  Plan: Continue flutter valve use Continue plain Mucinex Remain hydrated, taking Mucinex with 1 glass of water Can use albuterol nebulized meds prior to flutter valve to see if this helps with sputum production and sputum clearance  Asthma, chronic, moderate persistent vs ACOS in former smoker  Plan: Continue Symbicort 160 Continue rescue inhaler   Healthcare maintenance High dose flu  vaccine today   History of COVID-19 Tested positive for COVID-19 on 11/18/2020 Received monoclonal antibody infusion Did not require hospitalization Had received first 2 COVID-19 vaccinations, without booster   Plan: Obtain COVID-19 booster vaccination after 90 days after monoclonal antibody infusion Chest x-ray today    Return in about 3 months (around 03/14/2021), or if symptoms worsen or fail to improve, for Follow up with Dr. Sherene Sires.   Coral Ceo, NP 12/14/2020   This appointment required 32 minutes of patient care (this includes precharting, chart review, review of results, face-to-face care, etc.).

## 2020-12-21 NOTE — Telephone Encounter (Signed)
Error

## 2020-12-23 DIAGNOSIS — M7542 Impingement syndrome of left shoulder: Secondary | ICD-10-CM | POA: Diagnosis not present

## 2020-12-23 DIAGNOSIS — M79641 Pain in right hand: Secondary | ICD-10-CM | POA: Diagnosis not present

## 2020-12-23 DIAGNOSIS — M19041 Primary osteoarthritis, right hand: Secondary | ICD-10-CM | POA: Diagnosis not present

## 2020-12-30 ENCOUNTER — Other Ambulatory Visit: Payer: Self-pay | Admitting: Cardiovascular Disease

## 2020-12-30 NOTE — Telephone Encounter (Signed)
Please review for refill thanks 

## 2020-12-30 NOTE — Telephone Encounter (Signed)
Please review for refill, Thanks !  

## 2021-01-11 ENCOUNTER — Other Ambulatory Visit: Payer: Self-pay | Admitting: Family Medicine

## 2021-01-14 ENCOUNTER — Other Ambulatory Visit: Payer: Self-pay | Admitting: Family Medicine

## 2021-01-15 ENCOUNTER — Other Ambulatory Visit: Payer: Self-pay | Admitting: Family Medicine

## 2021-01-25 DIAGNOSIS — M1712 Unilateral primary osteoarthritis, left knee: Secondary | ICD-10-CM | POA: Diagnosis not present

## 2021-01-25 DIAGNOSIS — W19XXXA Unspecified fall, initial encounter: Secondary | ICD-10-CM | POA: Diagnosis not present

## 2021-02-03 ENCOUNTER — Other Ambulatory Visit: Payer: Self-pay | Admitting: Family Medicine

## 2021-02-11 ENCOUNTER — Other Ambulatory Visit: Payer: Self-pay | Admitting: Family Medicine

## 2021-02-24 DIAGNOSIS — E785 Hyperlipidemia, unspecified: Secondary | ICD-10-CM | POA: Diagnosis not present

## 2021-02-24 DIAGNOSIS — E669 Obesity, unspecified: Secondary | ICD-10-CM | POA: Diagnosis not present

## 2021-02-24 DIAGNOSIS — J9610 Chronic respiratory failure, unspecified whether with hypoxia or hypercapnia: Secondary | ICD-10-CM | POA: Diagnosis not present

## 2021-02-24 DIAGNOSIS — Z20822 Contact with and (suspected) exposure to covid-19: Secondary | ICD-10-CM | POA: Diagnosis not present

## 2021-02-24 DIAGNOSIS — M1990 Unspecified osteoarthritis, unspecified site: Secondary | ICD-10-CM | POA: Diagnosis not present

## 2021-02-24 DIAGNOSIS — D6869 Other thrombophilia: Secondary | ICD-10-CM | POA: Diagnosis not present

## 2021-02-24 DIAGNOSIS — K219 Gastro-esophageal reflux disease without esophagitis: Secondary | ICD-10-CM | POA: Diagnosis not present

## 2021-02-24 DIAGNOSIS — I4891 Unspecified atrial fibrillation: Secondary | ICD-10-CM | POA: Diagnosis not present

## 2021-02-24 DIAGNOSIS — I10 Essential (primary) hypertension: Secondary | ICD-10-CM | POA: Diagnosis not present

## 2021-02-24 DIAGNOSIS — J449 Chronic obstructive pulmonary disease, unspecified: Secondary | ICD-10-CM | POA: Diagnosis not present

## 2021-03-11 ENCOUNTER — Other Ambulatory Visit: Payer: Self-pay | Admitting: Family Medicine

## 2021-03-15 ENCOUNTER — Ambulatory Visit: Payer: Medicare HMO | Admitting: Internal Medicine

## 2021-03-15 ENCOUNTER — Encounter: Payer: Self-pay | Admitting: Internal Medicine

## 2021-03-15 ENCOUNTER — Ambulatory Visit (INDEPENDENT_AMBULATORY_CARE_PROVIDER_SITE_OTHER): Payer: Medicare HMO

## 2021-03-15 ENCOUNTER — Other Ambulatory Visit: Payer: Self-pay

## 2021-03-15 DIAGNOSIS — J479 Bronchiectasis, uncomplicated: Secondary | ICD-10-CM

## 2021-03-15 DIAGNOSIS — J9811 Atelectasis: Secondary | ICD-10-CM | POA: Diagnosis not present

## 2021-03-15 DIAGNOSIS — J4540 Moderate persistent asthma, uncomplicated: Secondary | ICD-10-CM | POA: Diagnosis not present

## 2021-03-15 DIAGNOSIS — J4541 Moderate persistent asthma with (acute) exacerbation: Secondary | ICD-10-CM | POA: Diagnosis not present

## 2021-03-15 DIAGNOSIS — K449 Diaphragmatic hernia without obstruction or gangrene: Secondary | ICD-10-CM | POA: Diagnosis not present

## 2021-03-15 DIAGNOSIS — R059 Cough, unspecified: Secondary | ICD-10-CM | POA: Diagnosis not present

## 2021-03-15 MED ORDER — AZITHROMYCIN 250 MG PO TABS: ORAL_TABLET | ORAL | 11 refills | Status: DC

## 2021-03-15 MED ORDER — ALBUTEROL SULFATE HFA 108 (90 BASE) MCG/ACT IN AERS: 2.0000 | INHALATION_SPRAY | RESPIRATORY_TRACT | 2 refills | Status: DC | PRN

## 2021-03-15 NOTE — Assessment & Plan Note (Signed)
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  PFT's  08/13/2018  FEV1 1.85 (63 % ) ratio 62   p symb 160/spiriva  And saba   prior to study with DLCO  75/74c % corrects to 108  % for alv volume   - 09/24/2018   try off spiriva > no worse as of 12/25/2018  - 06/27/2019   try reduce symb to 80 2bid to reduce flares of purulent tracheobronchitis > preferred symb 160 - 01/06/2020  After extensive coaching inhaler device,  effectiveness =    90% and flaring on symb 160 > try change to breztri 2bid x 2 weeks > preferred symbicort 160   All goals of chronic asthma control met including optimal function and elimination of symptoms with minimal need for rescue therapy.  Contingencies discussed in full including contacting this office immediately if not controlling the symptoms using the rule of two's.     F/u  q 6 m, sooner if needed  Also rec complete the 3rd covid vaccine          Each maintenance medication was reviewed in detail including emphasizing most importantly the difference between maintenance and prns and under what circumstances the prns are to be triggered using an action plan format where appropriate.  Total time for H and P, chart review, counseling, reviewing hfa  device(s) and generating customized AVS unique to this office visit / same day charting  = 25 min

## 2021-03-15 NOTE — Assessment & Plan Note (Signed)
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  Both mastoid sinuses are clear. Mild to moderate nasal septal deviation. - HRCT chest 09/19/2018  1. Mild cylindrical and varicoid bronchiectasis in the mid to lower lungs, unchanged. Associated mild bronchial wall thickening is slightly improved. 2. Stable scattered parenchymal bands in the mid to lower lungs, compatible with postinfectious/postinflammatory scarring. 3. Two month stability of 5 mm apical right upper lobe solid pulmonary nodule, more likely benign. Suggest follow-up chest CT in 6-12 months given the history of smoking and given that this nodule was previously seen to be new compared to 07/07/2017 chest CT. 4. Three-vessel coronary atherosclerosis. 5. Moderate hiatal hernia.  Add zpak prn purulent sputum and continue prn muciex/ flutter.as well as maint on symbicort 160 2bid for now

## 2021-03-15 NOTE — Patient Instructions (Addendum)
I very strongly recommend you get the moderna or pfizer vaccine as soon as possible based on your risk of dying from the virus  and the proven safety and benefit of these vaccines against even the delta and omicron variants.  This can save your life as well as  those of your loved ones,  especially if they are also not vaccinated.    Nasty mucus >  zpak  Thick mucus/ congestions > flutter and mucinex   Please remember to go to the  x-ray department  for your tests - we will call you with the results when they are available   Please schedule a follow up visit in 6  months but call sooner if needed

## 2021-03-15 NOTE — Progress Notes (Signed)
Mario [name], male    DOB: [birth year], 75 y.o.   MRN: [id number]   Brief patient profile:  75  yowm quit smoking in 1976 reports during  teens in [place] cough/ wheeze and several admits [facility] dx allergy to grass/ dust with def correlation with his symptoms  but resolved by age 31 then recurred while smoking in 1976 and seemed to get worse while in [place]> pulmonary eval rx and happy with results but on daily meds ever since moved to [place] in 1991 and saw [place] allergy in 2000 for placed on shots x maybe a year but no change in maint therapy rx which seemed to help but freq flares changed from symbicort/spiriva  to trelegy then much worse while on trelegy which was around 03/2018 with cough/ wheeze>  On his own switched back to symbicort/spiriva around 06/25/18 and referred to pulmonary clinic 07/05/2018 by Dr   Dennard Schaumann.    History of Present Illness  07/05/2018  [facility]/ 1st office eval / bronchiectasis in setting of chronic asthma  Chief Complaint  Patient presents with  . Pulmonary Consult    Referred by [name]. Pt c/o cough and SOB x 3 months. He is coughing up clear sputum, esp worse in the am. He uses proair 1-2 x daily on average and has neb with albuterol that he rarely uses.   has sensation "lungs full of congestion" but doesn't cough much up and what he does is clear  cpap helps at hs / sleeps  on R side and bed is flat/ on left side side down wheezing keeps him up even on cpap Last pred 10 days prior to OV  And not better whereas previously worked great. Finished the abx about the time of the last ct 06/26/18  rec Bronchiectasis =   you have scarring of your bronchial tubes which means that they don't function perfectly normally and mucus tends to pool in certain areas of your lung which can cause pneumonia and further scarring of your lung and bronchial tubes Whenever you develop cough congestion take mucinex or mucinex dm > these will help keep the mucus loose and flowing but  if your condition worsens you need to seek help immediately preferably here or somewhere inside the [facility] system to compare xrays ( worse = darker or bloody mucus or pain on breathing in)   Use the flutter valve as much as possible Protonix 40 mg Take 30- 60 min before your first and last meals of the day  GERD (REFLUX)    schedule sinus CT > pos > ENT eval req Please schedule a follow up office visit in 4 weeks, sooner if needed with PFTs on return    08/13/2018  f/u ov/Damondre Pfeifle re: chronic asthma vs ACOS vs obst bronchiectasis  Chief Complaint  Patient presents with  . Follow-up    Pt states he feels like he is only some better since last visit. Pt has c/o SOB with exertion, cough with clear mucus, occ. chest tightness.  Dyspnea:  MMRC1 = can walk nl pace, flat grade, can't hurry or go uphills or steps s sob   Cough: since 08/10/18 was better p ent rx, some clear mucus now   Sleeping: ok on 3 inch blocks SABA use: albuterol hs only  rec Plan A = Automatic = symbiocort 160 x 2 and spiriva x 2 pffs  first thing in am  then 12 hours later Symbiort x 160 mg Work on inhaler  technique:  Plan B = Backup Only use your albuterol as a rescue medication Plan C = Crisis - only use your albuterol nebulizer if you first try Plan B and it fails to help > ok to use the nebulizer up to every 4 hours but if start needing it regularly call for immediate appointment     09/24/2018  f/u ov/Lyberti Thrush re: chronic asthma vs acos vs obst bronchiectasis assoc with chronic rhinitis/sinusitis under ent care Chief Complaint  Patient presents with  . Follow-up    Cough has improved some. He states his breathing is doing well. He has used his albuterol inhaler x 1 since the last visit. He rarely uses neb.   Dyspnea:  Walks 30 min on a track  Better and better ex tol  Cough: better then worse x 2 weeks esp p stirs  But not using symb first thing  Sleeping: fine on 3 in blocks  SABA use: only used once as hfa/ not neb   02: no 02 rec Be sure to take the am dose dose of symbicort 160 x 2 first thing in am and leave off spiriva (think of it like high octane fuel)  For thick mucus/ cough / congestion > mucinex dm up to 1200 mg every 12 hours with glass of water and the flutter valve as much as possible  For nasty mucus >  Doxycyline 100 mg twice daily x 10 day course (refillable so keep on file)        06/27/2019  f/u ov/Domanique Luckett re: AB/ obst bronchiectasis flared x 4 d prior to [place]   Chief Complaint  Patient presents with  . Follow-up    Cough with green sputum- started on doxy 4 days ago. He is using his albuterol inhaler once per day on average and has not needed neb.   Dyspnea:  Walking 20 min interval training  Cough: acutely worse x 4 days s systemic symptoms Sleeping: 3 in bed blocks sleeping well  SABA use: once a day typically p supper in recliner  02: none rec Try symbicort 80 Take 2 puffs first thing in am and then another 2 puffs about 12 hours later.  only use your albuterol as a rescue medication    01/06/2020  f/u ov/Dawud Mays re: obst bronchiectasis/ preferred higher dose symb Chief Complaint  Patient presents with  . Follow-up    Pt c/o green nasal d/c x 10 days and also some chest congestion. He has been coughing more- clear sputum.   Dyspnea:  Denies though not very active over winter Cough: when lies down esp on L side with wheeze/ cough/ congestion  Sleeping: immediately in recliner or L side down  SABA use: none 02: none  rec Augmentin 875 mg take one pill twice daily  X 10 days - take at breakfast and supper with large glass of water.  It would help reduce the usual side effects (diarrhea and yeast infections) if you ate cultured yogurt at lunch.  Prednisone 10 mg take  4 each am x 2 days,   2 each am x 2 days,  1 each am x 2 days and stop  Breztri Take 2 puffs first thing in am and then another 2 puffs about 12 hours later.  Please schedule a follow up visit in 3 months but call  sooner if needed for referral to allergy  1st week in Dec 2021 p 2 pfizer got weak, fever, sob, cough> green > all gone p MAB  03/15/2021  f/u ov/Leeroy Lovings re: obst bronchiectasis / main on sym b 160  Chief Complaint  Patient presents with  . Follow-up    Coughing and wheezing at night  Dyspnea:  yardwork ok  Cough: at usual level  Re cough/  wheezing but worse since finished last  last zpak / no purulent sputum Sleeping: bed with bed blocks  SABA use: no saba  02: none  Covid status:   2 pfizer / covid 19   No obvious day to day or daytime variability or assoc  mucus plugs or hemoptysis or cp or chest tightness, or overt sinus or hb symptoms.   Sleeping as above without nocturnal  or early am exacerbation  of respiratory  c/o's or need for noct saba. Also denies any obvious fluctuation of symptoms with weather or environmental changes or other aggravating or alleviating factors except as outlined above   No unusual exposure hx or h/o childhood pna/ asthma or knowledge of premature birth.  Current Allergies, Complete Past Medical History, Past Surgical History, Family History, and Social History were reviewed in [employer] record.  ROS  The following are not active complaints unless bolded Hoarseness, sore throat, dysphagia, dental problems, itching, sneezing,  nasal congestion or discharge of excess mucus or purulent secretions, ear ache,   fever, chills, sweats, unintended wt loss or wt gain, classically pleuritic or exertional cp,  orthopnea pnd or arm/hand swelling  or leg swelling, presyncope, palpitations, abdominal pain, anorexia, nausea, vomiting, diarrhea  or change in bowel habits or change in bladder habits, change in stools or change in urine, dysuria, hematuria,  rash, arthralgias, visual complaints, headache, numbness, weakness or ataxia or problems with walking or coordination,  change in mood or  memory.        Current Meds  Medication Sig  .  albuterol (PROVENTIL) (2.5 MG/3ML) 0.083% nebulizer solution Take 3 mLs (2.5 mg total) by nebulization every 6 (six) hours as needed for wheezing or shortness of breath.  . Ascorbic Acid (VITAMIN C) 1000 MG tablet Take 1,000 mg by mouth 2 (two) times daily.  Marland Kitchen atorvastatin (LIPITOR) 40 MG tablet Take 1 tablet (40 mg total) by mouth daily.  . Cholecalciferol (VITAMIN D3) 125 MCG (5000 UT) TABS Take by mouth.  . Coenzyme Q10 (CO Q-10) 100 MG CAPS Take 1 capsule by mouth daily.  Marland Kitchen dextromethorphan-guaiFENesin (MUCINEX DM) 30-600 MG 12hr tablet Take 1 tablet by mouth at bedtime as needed for cough.  Marland Kitchen ELIQUIS 5 MG TABS tablet TAKE 1 TABLET BY MOUTH TWICE A DAY  . FIBER COMPLETE PO Take 1 tablet by mouth daily.  Marland Kitchen glucosamine-chondroitin 500-400 MG tablet Take 1 tablet by mouth 3 (three) times daily.  . hydrochlorothiazide (HYDRODIURIL) 25 MG tablet TAKE 1 TABLET BY MOUTH EVERY DAY  . losartan (COZAAR) 100 MG tablet TAKE 1 TABLET BY MOUTH EVERY DAY  . metoprolol succinate (TOPROL-XL) 25 MG 24 hr tablet TAKE 1 TABLET BY MOUTH EVERY DAY  . montelukast (SINGULAIR) 10 MG tablet TAKE 1 TABLET BY MOUTH EVERY DAY  . nystatin (MYCOSTATIN) 100000 UNIT/ML suspension Take 5 mLs (500,000 Units total) by mouth 4 (four) times daily. 7ml every 6 hours for 7 days. Please keep in mouth as long as possible  . pantoprazole (PROTONIX) 40 MG tablet TAKE 1 TABLET BY MOUTH TWICE A DAY  . PROAIR HFA 108 (90 Base) MCG/ACT inhaler TAKE 2 PUFFS BY MOUTH EVERY 6 HOURS AS NEEDED FOR WHEEZE OR SHORTNESS OF BREATH (Patient  taking differently: Inhale 2 puffs into the lungs every 6 (six) hours as needed for wheezing or shortness of breath.)  . Respiratory Therapy Supplies (FLUTTER) DEVI Use as directed  . rosuvastatin (CRESTOR) 20 MG tablet TAKE 1 TABLET BY MOUTH EVERY DAY **D/C LIPITOR**  . SYMBICORT 160-4.5 MCG/ACT inhaler TAKE 2 PUFFS BY MOUTH TWICE A DAY  . therapeutic multivitamin-minerals (THERAGRAN-M) tablet Take 1 tablet by  mouth daily.  Marland Kitchen UNABLE TO FIND Med Name: clear lungs otc 4 x daily               Objective:      03/15/2021       221 06/27/2019         192  12/25/2018         205  09/24/2018       212  08/13/2018       218   07/05/18 224 lb (101.6 kg)  06/12/18 230 lb (104.3 kg)  05/25/18 233 lb (105.7 kg)     Vital signs reviewed  03/15/2021  - Note at rest 02 sats  97% on RA   General appearance:    Mildly obese pleasant wm nad     HEENT : pt wearing mask not removed for exam due to covid - 19 concerns.   NECK :  without JVD/Nodes/TM/ nl carotid upstrokes bilaterally   LUNGS: no acc muscle use,  Min barrel  contour chest wall with bilateral faint basilar Insp/exp rhonchi  and  without cough on insp or exp maneuvers and min  Hyperresonant  to  percussion bilaterally     CV:  RRR  no s3 or murmur or increase in P2, and no edema   ABD:  soft and nontender with pos end  insp Hoover's  in the supine position. No bruits or organomegaly appreciated, bowel sounds nl  MS:   Nl gait/  ext warm without deformities, calf tenderness, cyanosis or clubbing No obvious joint restrictions   SKIN: warm and dry without lesions    NEURO:  alert, approp, nl sensorium with  no motor or cerebellar deficits apparent.         CXR PA and Lateral:   03/15/2021 :    I personally reviewed images/ impression as follows:    basilar scarring L > R base, no acute changes      Assessment

## 2021-03-16 ENCOUNTER — Encounter: Payer: Self-pay | Admitting: *Deleted

## 2021-03-26 ENCOUNTER — Ambulatory Visit (INDEPENDENT_AMBULATORY_CARE_PROVIDER_SITE_OTHER): Payer: Medicare HMO | Admitting: Family Medicine

## 2021-03-26 ENCOUNTER — Encounter: Payer: Self-pay | Admitting: Family Medicine

## 2021-03-26 ENCOUNTER — Other Ambulatory Visit: Payer: Self-pay

## 2021-03-26 VITALS — BP 120/80 | HR 80 | Temp 98.6°F | Resp 16 | Ht 69.0 in | Wt 220.0 lb

## 2021-03-26 DIAGNOSIS — I10 Essential (primary) hypertension: Secondary | ICD-10-CM | POA: Diagnosis not present

## 2021-03-26 DIAGNOSIS — Z125 Encounter for screening for malignant neoplasm of prostate: Secondary | ICD-10-CM | POA: Diagnosis not present

## 2021-03-26 DIAGNOSIS — E7800 Pure hypercholesterolemia, unspecified: Secondary | ICD-10-CM | POA: Diagnosis not present

## 2021-03-26 DIAGNOSIS — I4891 Unspecified atrial fibrillation: Secondary | ICD-10-CM | POA: Diagnosis not present

## 2021-03-26 NOTE — Progress Notes (Signed)
Subjective:    Patient ID: Mario [name], male    DOB: 1946/01/01, 75 y.o.   MRN: [id number]  Patient is a 75 year old Caucasian male here today for a checkup.  He is seeing his pulmonologist regularly for his bronchiectasis, COPD/asthma.  He has not needed prednisone in more than a year.  He states that his breathing overall is doing very well.  He has a history of atrial fibrillation and he is in atrial fibrillation this morning.  However he is rate controlled with metoprolol and appropriately anticoagulated with Eliquis.  He denies any chest pain.  He denies any palpitations.  He denies any syncope.  He denies any myalgias or right upper quadrant pain.  He is on Crestor for hyperlipidemia.  He is overdue for prostate cancer screening.  He has not had his COVID booster. Past Medical History:  Diagnosis Date  . Asthma   . Biliary colic 7510  . COPD (chronic obstructive pulmonary disease) with chronic bronchitis ([place])   . Coronary artery calcification seen on CAT scan   . Cyst of pancreas    1 cm (7/18), repeat ct in 12 months.    . Hyperlipidemia   . Hypertension   . Obstructive sleep apnea   . Renal disorder   . Rhinitis   . Sleep apnea    pt denies sleep apnea-lost weight   Past Surgical History:  Procedure Laterality Date  . COLONOSCOPY  09/29/2014  . LAPAROSCOPIC CHOLECYSTECTOMY SINGLE SITE WITH INTRAOPERATIVE CHOLANGIOGRAM N/A 09/15/2019   Procedure: LAPAROSCOPIC CHOLECYSTECTOMY SINGLE SITE;  Surgeon: Britain Boston, MD;  Location: [facility];  Service: General;  Laterality: N/A;  . LITHOTRIPSY  2000   for kidney stone   Current Outpatient Medications on File Prior to Visit  Medication Sig Dispense Refill  . albuterol (PROAIR HFA) 108 (90 Base) MCG/ACT inhaler Inhale 2 puffs into the lungs every 4 (four) hours as needed for wheezing or shortness of breath. 8.5 each 2  . albuterol (PROVENTIL) (2.5 MG/3ML) 0.083% nebulizer solution Take 3 mLs (2.5 mg total) by nebulization every 6  (six) hours as needed for wheezing or shortness of breath. 75 mL 12  . Ascorbic Acid (VITAMIN C) 1000 MG tablet Take 1,000 mg by mouth 2 (two) times daily.    . Cholecalciferol (VITAMIN D3) 125 MCG (5000 UT) TABS Take by mouth.    . Coenzyme Q10 (CO Q-10) 100 MG CAPS Take 1 capsule by mouth daily.    Marland Kitchen dextromethorphan-guaiFENesin (MUCINEX DM) 30-600 MG 12hr tablet Take 1 tablet by mouth at bedtime as needed for cough.    Marland Kitchen ELIQUIS 5 MG TABS tablet TAKE 1 TABLET BY MOUTH TWICE A DAY 60 tablet 5  . FIBER COMPLETE PO Take 1 tablet by mouth daily.    Marland Kitchen glucosamine-chondroitin 500-400 MG tablet Take 1 tablet by mouth 3 (three) times daily.    . hydrochlorothiazide (HYDRODIURIL) 25 MG tablet TAKE 1 TABLET BY MOUTH EVERY DAY 90 tablet 0  . losartan (COZAAR) 100 MG tablet TAKE 1 TABLET BY MOUTH EVERY DAY 30 tablet 0  . metoprolol succinate (TOPROL-XL) 25 MG 24 hr tablet TAKE 1 TABLET BY MOUTH EVERY DAY 90 tablet 0  . montelukast (SINGULAIR) 10 MG tablet TAKE 1 TABLET BY MOUTH EVERY DAY 90 tablet 1  . pantoprazole (PROTONIX) 40 MG tablet TAKE 1 TABLET BY MOUTH TWICE A DAY 60 tablet 2  . Respiratory Therapy Supplies (FLUTTER) DEVI Use as directed 1 each 0  . rosuvastatin (CRESTOR) 20  MG tablet TAKE 1 TABLET BY MOUTH EVERY DAY **D/C LIPITOR** 90 tablet 3  . SYMBICORT 160-4.5 MCG/ACT inhaler TAKE 2 PUFFS BY MOUTH TWICE A DAY 10.2 each 1   No current facility-administered medications on file prior to visit.   No Known Allergies Social History   Socioeconomic History  . Marital status: Married    Spouse name: Not on file  . Number of children: Not on file  . Years of education: Not on file  . Highest education level: Not on file  Occupational History  . Not on file  Tobacco Use  . Smoking status: Former Smoker    Packs/day: 2.00    Years: 11.00    Pack years: 22.00    Types: Cigarettes    Quit date: 12/19/1974    Years since quitting: 46.2  . Smokeless tobacco: Never Used  Vaping Use  .  Vaping Use: Never used  Substance and Sexual Activity  . Alcohol use: Yes    Alcohol/week: 1.0 standard drink    Types: 1 Glasses of wine per week  . Drug use: No  . Sexual activity: Not on file  Other Topics Concern  . Not on file  Social History Narrative  . Not on file   Social Determinants of Health   Financial Resource Strain: Not on file  Food Insecurity: No Food Insecurity  . Worried About Charity fundraiser in the Last Year: Never true  . Ran Out of Food in the Last Year: Never true  Transportation Needs: No Transportation Needs  . Lack of Transportation (Medical): No  . Lack of Transportation (Non-Medical): No  Physical Activity: Not on file  Stress: Not on file  Social Connections: Not on file  Intimate Partner Violence: Not on file      Review of Systems  Respiratory: Positive for cough.   All other systems reviewed and are negative.      Objective:   Physical Exam Vitals reviewed.  Constitutional:      Appearance: He is well-developed.  HENT:     Head: Normocephalic and atraumatic.     Nose: Nose normal.     Mouth/Throat:     Pharynx: No oropharyngeal exudate.  Neck:     Vascular: No carotid bruit.  Cardiovascular:     Rate and Rhythm: Normal rate. Rhythm irregular.     Heart sounds: Normal heart sounds.  Pulmonary:     Effort: Pulmonary effort is normal. No respiratory distress.     Breath sounds: No stridor. No wheezing, rhonchi or rales.  Musculoskeletal:     Right lower leg: No edema.     Left lower leg: No edema.           Assessment & Plan:  Pure hypercholesterolemia - Plan: CBC with Differential/Platelet, COMPLETE METABOLIC PANEL WITH GFR, Lipid panel  Prostate cancer screening - Plan: PSA  Benign essential HTN  Atrial fibrillation, unspecified type ([place])  Blood pressure today is well controlled.  I will check a CMP and a fasting lipid panel.  Ideally I like to see his LDL cholesterol less than 100.  Screen for prostate  cancer with a PSA.  Patient has atrial fibrillation however his heart rate is controlled with metoprolol.  He is otherwise asymptomatic.  He is appropriately anticoagulated with Eliquis.  Check a CBC to monitor for any anemia.  Encourage the patient to get a COVID booster given his underlying history of bronchiectasis and COPD although thankfully at the present time it  seems to be well managed.  Follow-up with pulmonology as planned.

## 2021-03-27 LAB — CBC WITH DIFFERENTIAL/PLATELET
Absolute Monocytes: 913 cells/uL (ref 200–950)
Basophils Absolute: 42 cells/uL (ref 0–200)
Basophils Relative: 0.5 %
Eosinophils Absolute: 174 cells/uL (ref 15–500)
Eosinophils Relative: 2.1 %
HCT: 44.6 % (ref 38.5–50.0)
Hemoglobin: 14.7 g/dL (ref 13.2–17.1)
Lymphs Abs: 1785 cells/uL (ref 850–3900)
MCH: 31.1 pg (ref 27.0–33.0)
MCHC: 33 g/dL (ref 32.0–36.0)
MCV: 94.3 fL (ref 80.0–100.0)
MPV: 9.9 fL (ref 7.5–12.5)
Monocytes Relative: 11 %
Neutro Abs: 5387 cells/uL (ref 1500–7800)
Neutrophils Relative %: 64.9 %
Platelets: 278 10*3/uL (ref 140–400)
RBC: 4.73 10*6/uL (ref 4.20–5.80)
RDW: 12.6 % (ref 11.0–15.0)
Total Lymphocyte: 21.5 %
WBC: 8.3 10*3/uL (ref 3.8–10.8)

## 2021-03-27 LAB — COMPLETE METABOLIC PANEL WITH GFR
AG Ratio: 1.8 (calc) (ref 1.0–2.5)
ALT: 25 U/L (ref 9–46)
AST: 20 U/L (ref 10–35)
Albumin: 4.2 g/dL (ref 3.6–5.1)
Alkaline phosphatase (APISO): 39 U/L (ref 35–144)
BUN/Creatinine Ratio: 28 (calc) — ABNORMAL HIGH (ref 6–22)
BUN: 28 mg/dL — ABNORMAL HIGH (ref 7–25)
CO2: 27 mmol/L (ref 20–32)
Calcium: 9.7 mg/dL (ref 8.6–10.3)
Chloride: 105 mmol/L (ref 98–110)
Creat: 1.01 mg/dL (ref 0.70–1.18)
GFR, Est African American: 83 mL/min/{1.73_m2} (ref >=60)
GFR, Est Non African American: 72 mL/min/{1.73_m2} (ref >=60)
Globulin: 2.3 g/dL (calc) (ref 1.9–3.7)
Glucose, Bld: 92 mg/dL (ref 65–99)
Potassium: 4.4 mmol/L (ref 3.5–5.3)
Sodium: 140 mmol/L (ref 135–146)
Total Bilirubin: 0.7 mg/dL (ref 0.2–1.2)
Total Protein: 6.5 g/dL (ref 6.1–8.1)

## 2021-03-27 LAB — LIPID PANEL
Cholesterol: 133 mg/dL (ref <200)
HDL: 60 mg/dL (ref >=40)
LDL Cholesterol (Calc): 57 mg/dL (calc)
Non-HDL Cholesterol (Calc): 73 mg/dL (calc) (ref <130)
Total CHOL/HDL Ratio: 2.2 (calc) (ref <5.0)
Triglycerides: 78 mg/dL (ref <150)

## 2021-03-27 LAB — PSA: PSA: 0.31 ng/mL (ref <=4.0)

## 2021-04-07 ENCOUNTER — Telehealth: Payer: Self-pay | Admitting: Family Medicine

## 2021-04-07 NOTE — Telephone Encounter (Signed)
Received fax from [facility] requesting refill on SYMBICORT 160-4.5 MCG/ACT inhaler

## 2021-04-08 ENCOUNTER — Other Ambulatory Visit: Payer: Self-pay | Admitting: Family Medicine

## 2021-04-08 MED ORDER — BUDESONIDE-FORMOTEROL FUMARATE 160-4.5 MCG/ACT IN AERO
INHALATION_SPRAY | RESPIRATORY_TRACT | 6 refills | Status: DC
Start: 2021-04-08 — End: 2021-11-03

## 2021-04-08 NOTE — Telephone Encounter (Signed)
Prescription sent to pharmacy.

## 2021-04-15 ENCOUNTER — Other Ambulatory Visit: Payer: Self-pay | Admitting: Family Medicine

## 2021-04-16 ENCOUNTER — Other Ambulatory Visit: Payer: Self-pay | Admitting: Family Medicine

## 2021-05-07 ENCOUNTER — Other Ambulatory Visit: Payer: Self-pay | Admitting: *Deleted

## 2021-05-07 MED ORDER — PANTOPRAZOLE SODIUM 40 MG PO TBEC
1.0000 | DELAYED_RELEASE_TABLET | Freq: Two times a day (BID) | ORAL | 2 refills | Status: DC
Start: 2021-05-07 — End: 2021-11-22

## 2021-05-13 NOTE — Progress Notes (Signed)
Subjective:   Mario [name] is a 75 y.o. male who presents for an Initial Medicare Annual Wellness Visit.  I connected with [name] today by telephone and verified that I am speaking with the correct person using two identifiers. Location patient: home Location provider: work Persons participating in the virtual visit: patient, provider.   I discussed the limitations, risks, security and privacy concerns of performing an evaluation and management service by telephone and the availability of in person appointments. I also discussed with the patient that there may be a patient responsible charge related to this service. The patient expressed understanding and verbally consented to this telephonic visit.    Interactive audio and video telecommunications were attempted between this provider and patient, however failed, due to patient having technical difficulties OR patient did not have access to video capability.  We continued and completed visit with audio only.      Review of Systems    N/A  Cardiac Risk Factors include: advanced age (>37men, >7 women);male gender     Objective:    Today's Vitals   05/14/21 1447  PainSc: 3    There is no height or weight on file to calculate BMI.  Advanced Directives 05/14/2021 07/17/2020 09/14/2019 01/22/2019 09/29/2014 09/15/2014 12/12/2012  Does Patient Have a Medical Advance Directive? Yes Yes Yes Yes Yes Yes Patient has advance directive, copy not in chart  Type of Advance Directive [place];Living will [place];Living will [place];Living will [place];Living will [place];Living will Living will;Mental [place];Living will  Does patient want to make changes to medical advance directive? No - Patient declined No - Patient declined No - Patient declined - - - -  Copy of [place] in Chart? No - copy requested - - - - - Copy requested from family    Current Medications (verified) Outpatient Encounter Medications as of 05/14/2021  Medication Sig  . albuterol (PROAIR HFA) 108 (90 Base) MCG/ACT inhaler Inhale 2 puffs into the lungs every 4 (four) hours as needed for wheezing or shortness of breath.  Marland Kitchen albuterol (PROVENTIL) (2.5 MG/3ML) 0.083% nebulizer solution Take 3 mLs (2.5 mg total) by nebulization every 6 (six) hours as needed for wheezing or shortness of breath.  . Ascorbic Acid (VITAMIN C) 1000 MG tablet Take 1,000 mg by mouth 2 (two) times daily.  . budesonide-formoterol (SYMBICORT) 160-4.5 MCG/ACT inhaler TAKE 2 PUFFS BY MOUTH TWICE A DAY  . Cholecalciferol (VITAMIN D3) 125 MCG (5000 UT) TABS Take by mouth.  . Coenzyme Q10 (CO Q-10) 100 MG CAPS Take 1 capsule by mouth daily.  Marland Kitchen dextromethorphan-guaiFENesin (MUCINEX DM) 30-600 MG 12hr tablet Take 1 tablet by mouth at bedtime as needed for cough.  Marland Kitchen ELIQUIS 5 MG TABS tablet TAKE 1 TABLET BY MOUTH TWICE A DAY  . FIBER COMPLETE PO Take 1 tablet by mouth daily.  Marland Kitchen glucosamine-chondroitin 500-400 MG tablet Take 1 tablet by mouth 3 (three) times daily.  . hydrochlorothiazide (HYDRODIURIL) 25 MG tablet TAKE 1 TABLET BY MOUTH EVERY DAY  . losartan (COZAAR) 100 MG tablet TAKE 1 TABLET BY MOUTH EVERY DAY  . metoprolol succinate (TOPROL-XL) 25 MG 24 hr tablet TAKE 1 TABLET BY MOUTH EVERY DAY  . montelukast (SINGULAIR) 10 MG tablet TAKE 1 TABLET BY MOUTH EVERY DAY  . pantoprazole (PROTONIX) 40 MG tablet Take 1 tablet (40 mg total) by mouth 2 (two)  times daily.  Marland Kitchen Respiratory Therapy Supplies (FLUTTER) DEVI Use as directed  . rosuvastatin (CRESTOR) 20 MG tablet TAKE 1 TABLET BY MOUTH EVERY DAY **D/C LIPITOR**   No facility-administered encounter medications on file as of 05/14/2021.    Allergies (verified) Patient has no known allergies.   History: Past Medical History:  Diagnosis Date  . Asthma   . 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 COPD (chronic obstructive pulmonary disease) with chronic bronchitis ([place])   . Coronary artery calcification seen on CAT scan   . Cyst of pancreas    1 cm (7/18), repeat ct in 12 months.    . Hyperlipidemia   . Hypertension   . Obstructive sleep apnea   . Renal disorder   . Rhinitis   . Sleep apnea    pt denies sleep apnea-lost weight   Past Surgical History:  Procedure Laterality Date  . COLONOSCOPY  09/29/2014  . LAPAROSCOPIC CHOLECYSTECTOMY SINGLE SITE WITH INTRAOPERATIVE CHOLANGIOGRAM N/A 09/15/2019   Procedure: LAPAROSCOPIC CHOLECYSTECTOMY SINGLE SITE;  Surgeon: Hardie Boston, MD;  Location: [facility];  Service: General;  Laterality: N/A;  . LITHOTRIPSY  2000   for kidney stone   Family History  Problem Relation Age of Onset  . Rheumatic fever Mother   . Stroke Father   . Atrial fibrillation Father   . Colon polyps Father   . Arthritis Sister   . Colon cancer Neg Hx   . Esophageal cancer Neg Hx   . Rectal cancer Neg Hx   . Stomach cancer Neg Hx    Social History   Socioeconomic History  . Marital status: Married    Spouse name: Not on file  . Number of children: Not on file  . Years of education: Not on file  . Highest education level: Not on file  Occupational History  . Not on file  Tobacco Use  . Smoking status: Former Smoker    Packs/day: 2.00    Years: 11.00    Pack years: 22.00    Types: Cigarettes    Quit date: 12/19/1974    Years since quitting: 46.4  . Smokeless tobacco: Never Used  Vaping Use  . Vaping Use: Never used  Substance and Sexual Activity  . Alcohol use: Yes    Alcohol/week: 1.0 standard drink    Types: 1 Glasses of wine per week  . Drug use: No  . Sexual activity: Not on file  Other Topics Concern  . Not on file  Social History Narrative  . Not on file   Social Determinants of Health   Financial Resource Strain: Low Risk   . Difficulty of Paying Living Expenses: Not hard at all  Food Insecurity: No Food Insecurity   . Worried About Charity fundraiser in the Last Year: Never true  . Ran Out of Food in the Last Year: Never true  Transportation Needs: No Transportation Needs  . Lack of Transportation (Medical): No  . Lack of Transportation (Non-Medical): No  Physical Activity: Insufficiently Active  . Days of Exercise per Week: 3 days  . Minutes of Exercise per Session: 30 min  Stress: No Stress Concern Present  . Feeling of Stress : Not at all  Social Connections: Moderately Integrated  . Frequency of Communication with Friends and Family: More than three times a week  . Frequency of Social Gatherings with Friends and Family: Three times a week  . Attends Religious Services: More than 4 times per year  . Active Member of  Clubs or Organizations: No  . Attends Archivist Meetings: Never  . Marital Status: Married    Tobacco Counseling Counseling given: Not Answered   Clinical Intake:  Pre-visit preparation completed: Yes  Pain : 0-10 Pain Score: 3  Pain Type: Chronic pain Pain Location: Shoulder Pain Orientation: Left Pain Descriptors / Indicators: Aching Pain Onset: More than a month ago Pain Frequency: Constant Pain Relieving Factors: OTC Pain medication  Pain Relieving Factors: OTC Pain medication  Nutritional Risks: None Diabetes: No  How often do you need to have someone help you when you read instructions, pamphlets, or other written materials from your doctor or pharmacy?: 1 - Never  Diabetic?No   Interpreter Needed?: No  Information entered by :: [place] of Daily Living In your present state of health, do you have any difficulty performing the following activities: 05/14/2021  Hearing? N  Vision? N  Difficulty concentrating or making decisions? N  Walking or climbing stairs? N  Dressing or bathing? N  Doing errands, shopping? N  Preparing Food and eating ? N  Using the Toilet? N  In the past six months, have you accidently leaked urine?  N  Do you have problems with loss of bowel control? N  Managing your Medications? N  Managing your Finances? N  Housekeeping or managing your Housekeeping? N  Some recent data might be hidden    Patient Care Team: [name], MD as PCP - General (Family Medicine) Tanda Rockers, MD as Consulting Physician (Pulmonary Disease) [name], MD as Consulting Physician (Cardiology) Milus Banister, MD as Consulting Physician (Gastroenterology)  Indicate any recent Medical Services you may have received from other than [facility] providers in the past year (date may be approximate).     Assessment:   This is a routine wellness examination for Mario [name].  Hearing/Vision screen  Hearing Screening   125Hz  250Hz  500Hz  1000Hz  2000Hz  3000Hz  4000Hz  6000Hz  8000Hz   Right ear:           Left ear:           Vision Screening Comments: Patient states gets eyes examined once per year. Patient currently wears reading glasses    Dietary issues and exercise activities discussed: Current Exercise Habits: Home exercise routine, Type of exercise: walking, Time (Minutes): 30, Frequency (Times/Week): 3, Weekly Exercise (Minutes/Week): 90, Intensity: Mild  Goals Addressed            This Visit's Progress   . Exercise 150 min/wk Moderate Activity      . Weight (lb) < 200 lb (90.7 kg)        Depression Screen PHQ 2/9 Scores 05/14/2021 03/26/2021 07/17/2020 10/19/2017 07/25/2014  PHQ - 2 Score 0 0 0 0 0    Fall Risk Fall Risk  05/14/2021 03/26/2021 10/19/2017 07/25/2014  Falls in the past year? 0 1 No No  Number falls in past yr: 0 0 - -  Injury with Fall? 0 0 - -  Risk for fall due to : No Fall Risks Impaired balance/gait;History of fall(s) - -  Follow up Falls evaluation completed;Falls prevention discussed Falls evaluation completed - -    FALL RISK PREVENTION PERTAINING TO THE HOME:  Any stairs in or around the home? Yes  If so, are there any without handrails? No  Home free of loose throw  rugs in walkways, pet beds, electrical cords, etc? Yes  Adequate lighting in your home to reduce risk of falls? Yes   ASSISTIVE  DEVICES UTILIZED TO PREVENT FALLS:  Life alert? No  Use of a cane, walker or w/c? No  Grab bars in the bathroom? No  Shower chair or bench in shower? No  Elevated toilet seat or a handicapped toilet? Yes    Cognitive Function:   Normal cognitive status assessed by direct observation by this Nurse Health Advisor. No abnormalities found.        Immunizations Immunization History  Administered Date(s) Administered  . Fluad Quad(high Dose 65+) 09/09/2019  . Influenza Split 09/09/2013  . Influenza, High Dose Seasonal PF 09/22/2015, 09/27/2016, 09/27/2016, 09/11/2017  . Influenza,inj,Quad PF,6+ Mos 09/03/2018  . Influenza-Unspecified 09/18/2014  . PFIZER(Purple Top)SARS-COV-2 Vaccination 02/05/2020, 02/26/2020  . Pneumococcal Conjugate-13 02/17/2014  . Pneumococcal Polysaccharide-23 10/13/2009, 04/16/2015  . Tdap 03/13/2007  . Zoster, Live 05/29/2015    TDAP status: Due, Education has been provided regarding the importance of this vaccine. Advised may receive this vaccine at local pharmacy or Health Dept. Aware to provide a copy of the vaccination record if obtained from local pharmacy or Health Dept. Verbalized acceptance and understanding.  Flu Vaccine status: Due, Education has been provided regarding the importance of this vaccine. Advised may receive this vaccine at local pharmacy or Health Dept. Aware to provide a copy of the vaccination record if obtained from local pharmacy or Health Dept. Verbalized acceptance and understanding.  Pneumococcal vaccine status: Up to date  Covid-19 vaccine status: Completed vaccines  Qualifies for Shingles Vaccine? Yes   Zostavax completed Yes   Shingrix Completed?: No.    Education has been provided regarding the importance of this vaccine. Patient has been advised to call insurance company to determine out of  pocket expense if they have not yet received this vaccine. Advised may also receive vaccine at local pharmacy or Health Dept. Verbalized acceptance and understanding.  Screening Tests Health Maintenance  Topic Date Due  . Hepatitis C Screening  Never done  . Zoster Vaccines- Shingrix (1 of 2) Never done  . TETANUS/TDAP  03/12/2017  . COVID-19 Vaccine (3 - Booster for Pfizer series) 07/28/2020  . INFLUENZA VACCINE  07/19/2021  . COLONOSCOPY (Pts 45-4yrs Insurance coverage will need to be confirmed)  11/06/2021  . PNA vac Low Risk Adult  Completed  . HPV VACCINES  Aged Out    Health Maintenance  Health Maintenance Due  Topic Date Due  . Hepatitis C Screening  Never done  . Zoster Vaccines- Shingrix (1 of 2) Never done  . TETANUS/TDAP  03/12/2017  . COVID-19 Vaccine (3 - Booster for Pfizer series) 07/28/2020    Colorectal cancer screening: Type of screening: Colonoscopy. Completed 11/06/2018. Repeat every 3 years  Lung Cancer Screening: (Low Dose CT Chest recommended if Age 20-80 years, 30 pack-year currently smoking OR have quit w/in 15years.) does not qualify.   Lung Cancer Screening Referral: N/A   Additional Screening:  Hepatitis C Screening: does qualify;   Vision Screening: Recommended annual ophthalmology exams for early detection of glaucoma and other disorders of the eye. Is the patient up to date with their annual eye exam?  Yes  Who is the provider or what is the name of the office in which the patient attends annual eye exams? Patient unsure of eye doctors name If pt is not established with a provider, would they like to be referred to a provider to establish care? No .   Dental Screening: Recommended annual dental exams for proper oral hygiene  Community Resource Referral / Chronic Care Management: CRR required  this visit?  No   CCM required this visit?  No      Plan:     I have personally reviewed and noted the following in the patient's chart:    . Medical and social history . Use of alcohol, tobacco or illicit drugs  . Current medications and supplements including opioid prescriptions. Patient is not currently taking opioid prescriptions. . Functional ability and status . Nutritional status . Physical activity . Advanced directives . List of other physicians . Hospitalizations, surgeries, and ER visits in previous 12 months . Vitals . Screenings to include cognitive, depression, and falls . Referrals and appointments  In addition, I have reviewed and discussed with patient certain preventive protocols, quality metrics, and best practice recommendations. A written personalized care plan for preventive services as well as general preventive health recommendations were provided to patient.     Mario Neas, LPN   [id number]   Nurse Notes: None

## 2021-05-14 ENCOUNTER — Other Ambulatory Visit: Payer: Self-pay

## 2021-05-14 ENCOUNTER — Ambulatory Visit (INDEPENDENT_AMBULATORY_CARE_PROVIDER_SITE_OTHER): Payer: Medicare HMO

## 2021-05-14 DIAGNOSIS — Z0000 Encounter for general adult medical examination without abnormal findings: Secondary | ICD-10-CM

## 2021-05-14 NOTE — Patient Instructions (Signed)
Mr. Mario [name] , Thank you for taking time to come for your Medicare Wellness Visit. I appreciate your ongoing commitment to your health goals. Please review the following plan we discussed and let me know if I can assist you in the future.   Screening recommendations/referrals: Colonoscopy: Up to date, next due fall 11/06/2028 Recommended yearly ophthalmology/optometry visit for glaucoma screening and checkup Recommended yearly dental visit for hygiene and checkup  Vaccinations: Influenza vaccine: Up to date, next due fall 2022 Pneumococcal vaccine: Completed series  Tdap vaccine: Currently due, you may await and injury to receive  Shingles vaccine: You had the Zostavax shingles vaccine on 05/29/2015 there is now a newer Shingles vaccine called Shingrix that you could get if you would like. The efficacy rate is higher. If you would like to receive this we recommend that you do so at your local pharmacy.  Advanced directives: Please bring in copies of your advanced medical directives   Conditions/risks identified: none  Next appointment: None  Preventive Care 65 Years and Older, Male Preventive care refers to lifestyle choices and visits with your health care provider that can promote health and wellness. What does preventive care include?  A yearly physical exam. This is also called an annual well check.  Dental exams once or twice a year.  Routine eye exams. Ask your health care provider how often you should have your eyes checked.  Personal lifestyle choices, including:  Daily care of your teeth and gums.  Regular physical activity.  Eating a healthy diet.  Avoiding tobacco and drug use.  Limiting alcohol use.  Practicing safe sex.  Taking low doses of aspirin every day.  Taking vitamin and mineral supplements as recommended by your health care provider. What happens during an annual well check? The services and screenings done by your health care provider during your  annual well check will depend on your age, overall health, lifestyle risk factors, and family history of disease. Counseling  Your health care provider may ask you questions about your:  Alcohol use.  Tobacco use.  Drug use.  Emotional well-being.  Home and relationship well-being.  Sexual activity.  Eating habits.  History of falls.  Memory and ability to understand (cognition).  Work and work Statistician. Screening  You may have the following tests or measurements:  Height, weight, and BMI.  Blood pressure.  Lipid and cholesterol levels. These may be checked every 5 years, or more frequently if you are over 51 years old.  Skin check.  Lung cancer screening. You may have this screening every year starting at age 34 if you have a 30-pack-year history of smoking and currently smoke or have quit within the past 15 years.  Fecal occult blood test (FOBT) of the stool. You may have this test every year starting at age 2.  Flexible sigmoidoscopy or colonoscopy. You may have a sigmoidoscopy every 5 years or a colonoscopy every 10 years starting at age 26.  Prostate cancer screening. Recommendations will vary depending on your family history and other risks.  Hepatitis C blood test.  Hepatitis B blood test.  Sexually transmitted disease (STD) testing.  Diabetes screening. This is done by checking your blood sugar (glucose) after you have not eaten for a while (fasting). You may have this done every 1-3 years.  Abdominal aortic aneurysm (AAA) screening. You may need this if you are a current or former smoker.  Osteoporosis. You may be screened starting at age 67 if you are at high risk.  Talk with your health care provider about your test results, treatment options, and if necessary, the need for more tests. Vaccines  Your health care provider may recommend certain vaccines, such as:  Influenza vaccine. This is recommended every year.  Tetanus, diphtheria, and  acellular pertussis (Tdap, Td) vaccine. You may need a Td booster every 10 years.  Zoster vaccine. You may need this after age 73.  Pneumococcal 13-valent conjugate (PCV13) vaccine. One dose is recommended after age 100.  Pneumococcal polysaccharide (PPSV23) vaccine. One dose is recommended after age 66. Talk to your health care provider about which screenings and vaccines you need and how often you need them. This information is not intended to replace advice given to you by your health care provider. Make sure you discuss any questions you have with your health care provider. Document Released: 01/01/2016 Document Revised: 08/24/2016 Document Reviewed: 10/06/2015 Elsevier Interactive Patient Education  2017 [place] Prevention in the Home Falls can cause injuries. They can happen to people of all ages. There are many things you can do to make your home safe and to help prevent falls. What can I do on the outside of my home?  Regularly fix the edges of walkways and driveways and fix any cracks.  Remove anything that might make you trip as you walk through a door, such as a raised step or threshold.  Trim any bushes or trees on the path to your home.  Use bright outdoor lighting.  Clear any walking paths of anything that might make someone trip, such as rocks or tools.  Regularly check to see if handrails are loose or broken. Make sure that both sides of any steps have handrails.  Any raised decks and porches should have guardrails on the edges.  Have any leaves, snow, or ice cleared regularly.  Use sand or salt on walking paths during winter.  Clean up any spills in your garage right away. This includes oil or grease spills. What can I do in the bathroom?  Use night lights.  Install grab bars by the toilet and in the tub and shower. Do not use towel bars as grab bars.  Use non-skid mats or decals in the tub or shower.  If you need to sit down in the shower, use  a plastic, non-slip stool.  Keep the floor dry. Clean up any water that spills on the floor as soon as it happens.  Remove soap buildup in the tub or shower regularly.  Attach bath mats securely with double-sided non-slip rug tape.  Do not have throw rugs and other things on the floor that can make you trip. What can I do in the bedroom?  Use night lights.  Make sure that you have a light by your bed that is easy to reach.  Do not use any sheets or blankets that are too big for your bed. They should not hang down onto the floor.  Have a firm chair that has side arms. You can use this for support while you get dressed.  Do not have throw rugs and other things on the floor that can make you trip. What can I do in the kitchen?  Clean up any spills right away.  Avoid walking on wet floors.  Keep items that you use a lot in easy-to-reach places.  If you need to reach something above you, use a strong step stool that has a grab bar.  Keep electrical cords out of the way.  Do not use floor polish or wax that makes floors slippery. If you must use wax, use non-skid floor wax.  Do not have throw rugs and other things on the floor that can make you trip. What can I do with my stairs?  Do not leave any items on the stairs.  Make sure that there are handrails on both sides of the stairs and use them. Fix handrails that are broken or loose. Make sure that handrails are as long as the stairways.  Check any carpeting to make sure that it is firmly attached to the stairs. Fix any carpet that is loose or worn.  Avoid having throw rugs at the top or bottom of the stairs. If you do have throw rugs, attach them to the floor with carpet tape.  Make sure that you have a light switch at the top of the stairs and the bottom of the stairs. If you do not have them, ask someone to add them for you. What else can I do to help prevent falls?  Wear shoes that:  Do not have high heels.  Have  rubber bottoms.  Are comfortable and fit you well.  Are closed at the toe. Do not wear sandals.  If you use a stepladder:  Make sure that it is fully opened. Do not climb a closed stepladder.  Make sure that both sides of the stepladder are locked into place.  Ask someone to hold it for you, if possible.  Clearly mark and make sure that you can see:  Any grab bars or handrails.  First and last steps.  Where the edge of each step is.  Use tools that help you move around (mobility aids) if they are needed. These include:  Canes.  Walkers.  Scooters.  Crutches.  Turn on the lights when you go into a dark area. Replace any light bulbs as soon as they burn out.  Set up your furniture so you have a clear path. Avoid moving your furniture around.  If any of your floors are uneven, fix them.  If there are any pets around you, be aware of where they are.  Review your medicines with your doctor. Some medicines can make you feel dizzy. This can increase your chance of falling. Ask your doctor what other things that you can do to help prevent falls. This information is not intended to replace advice given to you by your health care provider. Make sure you discuss any questions you have with your health care provider. Document Released: 10/01/2009 Document Revised: 05/12/2016 Document Reviewed: 01/09/2015 Elsevier Interactive Patient Education  2017 [employer].

## 2021-07-09 ENCOUNTER — Other Ambulatory Visit: Payer: Self-pay | Admitting: Cardiovascular Disease

## 2021-07-09 NOTE — Telephone Encounter (Signed)
Prescription refill request for Eliquis received. Indication:atrial fib Last office visit:Needs Visit Scr:1.0 Age: 75 Weight:99.8 kg  Prescription refilled

## 2021-07-13 ENCOUNTER — Other Ambulatory Visit: Payer: Self-pay | Admitting: Family Medicine

## 2021-07-14 ENCOUNTER — Other Ambulatory Visit: Payer: Self-pay | Admitting: Family Medicine

## 2021-07-30 ENCOUNTER — Other Ambulatory Visit: Payer: Self-pay | Admitting: Cardiovascular Disease

## 2021-07-30 ENCOUNTER — Other Ambulatory Visit: Payer: Self-pay | Admitting: Family Medicine

## 2021-07-30 NOTE — Telephone Encounter (Signed)
Prescription refill request for Eliquis received. Indication:afib Last office visit:05/01/19 w/berry (overdue) Scr:1.01 03/26/21 Age: 81mWeight:99.8kg Hold refill until appt scheduled. Will send a msg to schedulers.

## 2021-07-30 NOTE — Telephone Encounter (Signed)
Zara Council sent to Allean Found, [place] Patient has been scheduled for 10/22/21 with Dr. Gwenlyn Found.

## 2021-08-03 ENCOUNTER — Other Ambulatory Visit: Payer: Self-pay | Admitting: Family Medicine

## 2021-08-05 ENCOUNTER — Other Ambulatory Visit: Payer: Self-pay | Admitting: Family Medicine

## 2021-09-17 ENCOUNTER — Encounter: Payer: Self-pay | Admitting: Adult Health

## 2021-09-17 ENCOUNTER — Other Ambulatory Visit: Payer: Self-pay

## 2021-09-17 ENCOUNTER — Ambulatory Visit: Payer: Medicare HMO | Admitting: Adult Health

## 2021-09-17 ENCOUNTER — Ambulatory Visit: Payer: Medicare HMO | Admitting: Internal Medicine

## 2021-09-17 DIAGNOSIS — J4540 Moderate persistent asthma, uncomplicated: Secondary | ICD-10-CM

## 2021-09-17 DIAGNOSIS — J479 Bronchiectasis, uncomplicated: Secondary | ICD-10-CM

## 2021-09-17 NOTE — Assessment & Plan Note (Signed)
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  Has had a recent flare of his bronchiectasis.  However no wheezing or increased shortness of breath.  Hold on any steroids at this time.  Plan . Patient Instructions  Finish Zpack as directed.  Continue on Symbicort 2 puffs Twice daily  , rinse after use.  Albuterol inhaler or neb As needed   Mucinex Twice daily  As needed   Flutter valve Twice daily  Flu shot next month .  Activity as tolerated.  Follow up with Dr. Melvyn Novas  in 6 months and As needed

## 2021-09-17 NOTE — Assessment & Plan Note (Signed)
Patient has been doing well however has had a recent exacerbation now appears to be improving with antibiotic therapy.  Patient is increase his mucociliary clearance with flutter valve up to twice daily.  Uses albuterol inhaler or nebulizer as needed.  Continue on Mucinex. Advised him to finish his current Z-Pak.  If symptoms return or increase would consider a sputum culture  Plan  Patient Instructions  Finish Zpack as directed.  Continue on Symbicort 2 puffs Twice daily  , rinse after use.  Albuterol inhaler or neb As needed   Mucinex Twice daily  As needed   Flutter valve Twice daily  Flu shot next month .  Activity as tolerated.  Follow up with Dr. Melvyn Novas  in 6 months and As needed

## 2021-09-17 NOTE — Progress Notes (Signed)
@Patient  ID: Mario [name], male    DOB: 1946/08/23, 75 y.o.   MRN: [id number]  Chief Complaint  Patient presents with   Follow-up    Referring provider: [name], MD  HPI: 75 year old male former smoker followed for bronchiectasis and chronic asthma, chronic sinusitis  TEST/EVENTS :  High-resolution CT chest October 2019 mild bronchiectasis in the mid to lower lungs.  Scattered parenchymal bands in the mid to lower lungs.  Stable 5 mm right upper lobe nodule.  - Alpha one Screen 07/05/2018   MM level 171 - Allergy profile 07/05/18  >  Eos 0.1 /  IgE  652  Pos allergies to grass and mold and dust  PFT's  08/13/2018  FEV1 1.85 (63 % ) ratio 62   p symb 160/spiriva  And saba   prior to study with DLCO  75/74c % corrects to 108  % for alv volume    09/17/2021 Follow up : Chronic asthma and bronchiectasis  Patient returns for 87-month follow-up.  Patient has underlying asthma and bronchiectasis.  Says that he has been doing the best he is done in a long time.  However about 2 weeks ago he started having some increased cough and congestion with a change in weather.  Patient started on a Z-Pak initially.  Patient says it did help but did not cleared up totally so he continued on his second Z-Pak which she has 3 days left.  Patient says he is now starting to feel much better.  Mucus has now cleared and cough is minimally productive.  He denies any fever, chest pain, orthopnea, edema.  Appetite is good with no nausea vomiting or diarrhea.  Patient does have a flutter valve usually uses about once a day.  Has albuterol inhaler or nebulizer but has not used.  Remains on Symbicort twice daily.  Patient says he is very active.  Works in the yard.    No Known Allergies  Immunization History  Administered Date(s) Administered   Fluad Quad(high Dose 65+) 09/09/2019   Influenza Split 09/09/2013   Influenza, High Dose Seasonal PF 09/22/2015, 09/27/2016, 09/27/2016, 09/11/2017    Influenza,inj,Quad PF,6+ Mos 09/03/2018   Influenza-Unspecified 09/18/2014   PFIZER(Purple Top)SARS-COV-2 Vaccination 02/05/2020, 02/26/2020   Pneumococcal Conjugate-13 02/17/2014   Pneumococcal Polysaccharide-23 10/13/2009, 04/16/2015   Tdap 03/13/2007   Zoster, Live 05/29/2015    Past Medical History:  Diagnosis Date   Asthma    Biliary colic 3295   COPD (chronic obstructive pulmonary disease) with chronic bronchitis (HCC)    Coronary artery calcification seen on CAT scan    Cyst of pancreas    1 cm (7/18), repeat ct in 12 months.     Hyperlipidemia    Hypertension    Obstructive sleep apnea    Renal disorder    Rhinitis    Sleep apnea    pt denies sleep apnea-lost weight    Tobacco History: Social History   Tobacco Use  Smoking Status Former   Packs/day: 2.00   Years: 11.00   Pack years: 22.00   Types: Cigarettes   Quit date: 12/19/1974   Years since quitting: 46.7  Smokeless Tobacco Never   Counseling given: Not Answered   Outpatient Medications Prior to Visit  Medication Sig Dispense Refill   albuterol (PROAIR HFA) 108 (90 Base) MCG/ACT inhaler Inhale 2 puffs into the lungs every 4 (four) hours as needed for wheezing or shortness of breath. 8.5 each 2   albuterol (PROVENTIL) (2.5 MG/3ML) 0.083%  nebulizer solution Take 3 mLs (2.5 mg total) by nebulization every 6 (six) hours as needed for wheezing or shortness of breath. 75 mL 12   apixaban (ELIQUIS) 5 MG TABS tablet TAKE 1 TABLET BY MOUTH TWICE A DAY 180 tablet 0   Ascorbic Acid (VITAMIN C) 1000 MG tablet Take 1,000 mg by mouth 2 (two) times daily.     budesonide-formoterol (SYMBICORT) 160-4.5 MCG/ACT inhaler TAKE 2 PUFFS BY MOUTH TWICE A DAY 10.2 each 6   Cholecalciferol (VITAMIN D3) 125 MCG (5000 UT) TABS Take by mouth.     Coenzyme Q10 (CO Q-10) 100 MG CAPS Take 1 capsule by mouth daily.     dextromethorphan-guaiFENesin (MUCINEX DM) 30-600 MG 12hr tablet Take 1 tablet by mouth at bedtime as needed for cough.      FIBER COMPLETE PO Take 1 tablet by mouth daily.     glucosamine-chondroitin 500-400 MG tablet Take 1 tablet by mouth 3 (three) times daily.     hydrochlorothiazide (HYDRODIURIL) 25 MG tablet TAKE 1 TABLET BY MOUTH EVERY DAY 90 tablet 0   losartan (COZAAR) 100 MG tablet TAKE 1 TABLET BY MOUTH EVERY DAY 30 tablet 3   metoprolol succinate (TOPROL-XL) 25 MG 24 hr tablet TAKE 1 TABLET BY MOUTH EVERY DAY 90 tablet 0   montelukast (SINGULAIR) 10 MG tablet TAKE 1 TABLET BY MOUTH EVERY DAY 90 tablet 1   pantoprazole (PROTONIX) 40 MG tablet Take 1 tablet (40 mg total) by mouth 2 (two) times daily. 180 tablet 2   Respiratory Therapy Supplies (FLUTTER) DEVI Use as directed 1 each 0   rosuvastatin (CRESTOR) 20 MG tablet TAKE 1 TABLET BY MOUTH EVERY DAY **D/C LIPITOR** 90 tablet 3   azithromycin (ZITHROMAX) 250 MG tablet Take 1 tablet by mouth daily.     No facility-administered medications prior to visit.     Review of Systems:   Constitutional:   No  weight loss, night sweats,  Fevers, chills, fatigue, or  lassitude.  HEENT:   No headaches,  Difficulty swallowing,  Tooth/dental problems, or  Sore throat,                No sneezing, itching, ear ache,  +nasal congestion, post nasal drip,   CV:  No chest pain,  Orthopnea, PND, swelling in lower extremities, anasarca, dizziness, palpitations, syncope.   GI  No heartburn, indigestion, abdominal pain, nausea, vomiting, diarrhea, change in bowel habits, loss of appetite, bloody stools.   Resp: No chest wall deformity  Skin: no rash or lesions.  GU: no dysuria, change in color of urine, no urgency or frequency.  No flank pain, no hematuria   MS:  No joint pain or swelling.  No decreased range of motion.  No back pain.    Physical Exam  BP 140/70 (BP Location: Left Arm, Patient Position: Sitting, Cuff Size: Normal)   Pulse (!) 51   Temp 98.4 F (36.9 C) (Oral)   Ht 5\' 9"  (1.753 m)   Wt 235 lb 9.6 oz (106.9 kg)   SpO2 96%   BMI 34.79  kg/m   GEN: A/Ox3; pleasant , NAD, well nourished    HEENT:  [place]/AT, NOSE-clear, THROAT-clear, no lesions, no postnasal drip or exudate noted.   NECK:  Supple w/ fair ROM; no JVD; normal carotid impulses w/o bruits; no thyromegaly or nodules palpated; no lymphadenopathy.    RESP  Clear  P & A; w/o, wheezes/ rales/ or rhonchi. no accessory muscle use, no dullness to percussion  CARD:  RRR, no m/r/g, no peripheral edema, pulses intact, no cyanosis or clubbing.  GI:   Soft & nt; nml bowel sounds; no organomegaly or masses detected.   Musco: Warm bil, no deformities or joint swelling noted.   Neuro: alert, no focal deficits noted.    Skin: Warm, no lesions or rashes    Lab Results:  CBC    Component Value Date/Time   WBC 8.3 03/26/2021 0855   RBC 4.73 03/26/2021 0855   HGB 14.7 03/26/2021 0855   HCT 44.6 03/26/2021 0855   PLT 278 03/26/2021 0855   MCV 94.3 03/26/2021 0855   MCH 31.1 03/26/2021 0855   MCHC 33.0 03/26/2021 0855   RDW 12.6 03/26/2021 0855   LYMPHSABS 1,785 03/26/2021 0855   MONOABS 1.3 (H) 09/16/2019 0341   EOSABS 174 03/26/2021 0855   BASOSABS 42 03/26/2021 0855    BMET    Component Value Date/Time   NA 140 03/26/2021 0855   K 4.4 03/26/2021 0855   CL 105 03/26/2021 0855   CO2 27 03/26/2021 0855   GLUCOSE 92 03/26/2021 0855   BUN 28 (H) 03/26/2021 0855   CREATININE 1.01 03/26/2021 0855   CALCIUM 9.7 03/26/2021 0855   GFRNONAA 72 03/26/2021 0855   GFRAA 83 03/26/2021 0855    BNP    Component Value Date/Time   BNP 38 06/12/2018 1455    ProBNP No results found for: PROBNP  Imaging: No results found.    PFT Results Latest Ref Rng & Units 08/13/2018  FVC-Pre L 2.99  FVC-Predicted Pre % 73  Pre FEV1/FVC % % 62  FEV1-Pre L 1.85  FEV1-Predicted Pre % 63  DLCO uncorrected ml/min/mmHg 23.00  DLCO UNC% % 75  DLCO corrected ml/min/mmHg 22.69  DLCO COR %Predicted % 74  DLVA Predicted % 108  TLC L 5.95  TLC % Predicted % 88  RV %  Predicted % 120    No results found for: NITRICOXIDE      Assessment & Plan:   Bronchiectasis without complication [facility]) Patient has been doing well however has had a recent exacerbation now appears to be improving with antibiotic therapy.  Patient is increase his mucociliary clearance with flutter valve up to twice daily.  Uses albuterol inhaler or nebulizer as needed.  Continue on Mucinex. Advised him to finish his current Z-Pak.  If symptoms return or increase would consider a sputum culture  Plan  Patient Instructions  Finish Zpack as directed.  Continue on Symbicort 2 puffs Twice daily  , rinse after use.  Albuterol inhaler or neb As needed   Mucinex Twice daily  As needed   Flutter valve Twice daily  Flu shot next month .  Activity as tolerated.  Follow up with Dr. 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  Has had a recent flare of his bronchiectasis.  However no wheezing or increased shortness of breath.  Hold on any steroids at this time.  Plan . Patient Instructions  Finish Zpack as directed.  Continue on Symbicort 2 puffs Twice daily  , rinse after use.  Albuterol inhaler or neb As needed   Mucinex Twice daily  As needed   Flutter valve Twice daily  Flu shot next month .  Activity as tolerated.  Follow up with Dr. Melvyn Novas  in 6 months and As needed  Rexene Edison, NP 09/17/2021

## 2021-09-17 NOTE — Patient Instructions (Signed)
Finish Zpack as directed.  Continue on Symbicort 2 puffs Twice daily  , rinse after use.  Albuterol inhaler or neb As needed   Mucinex Twice daily  As needed   Flutter valve Twice daily  Flu shot next month .  Activity as tolerated.  Follow up with Dr. Melvyn Novas  in 6 months and As needed

## 2021-10-15 ENCOUNTER — Other Ambulatory Visit: Payer: Self-pay | Admitting: Family Medicine

## 2021-10-22 ENCOUNTER — Other Ambulatory Visit: Payer: Self-pay

## 2021-10-22 ENCOUNTER — Encounter: Payer: Self-pay | Admitting: Cardiovascular Disease

## 2021-10-22 ENCOUNTER — Ambulatory Visit: Payer: Medicare HMO | Admitting: Cardiovascular Disease

## 2021-10-22 DIAGNOSIS — G4733 Obstructive sleep apnea (adult) (pediatric): Secondary | ICD-10-CM

## 2021-10-22 DIAGNOSIS — I480 Paroxysmal atrial fibrillation: Secondary | ICD-10-CM | POA: Diagnosis not present

## 2021-10-22 DIAGNOSIS — I10 Essential (primary) hypertension: Secondary | ICD-10-CM

## 2021-10-22 DIAGNOSIS — E782 Mixed hyperlipidemia: Secondary | ICD-10-CM

## 2021-10-22 NOTE — Progress Notes (Signed)
10/22/2021 Mario [name]   07/22/1946  [id number]  Primary Physician Dennard Schaumann [name], MD Primary Cardiologist: [name] MD Lupe Carney, [place]  HPI:  Mario [name] is a 75 y.o. 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  I last saw him i for a virtual telemedicine video visit 04/16/2019.  He was referred by [name], his PCP, for evaluation and treatment of PAF. He has a history of hypertension, and hyperlipidemia. He does have obstructive sleep apnea on CPAP. He's never had a heart attack or stroke and denies chest pain or shortness of breath. 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 He drinks 2 cups of caffeine a day. He's had palpitations for the last 2 years. Mostly basis lasting up to one hour at a time.   Because of these palpitations, he had an event monitor performed 01/08/2018 revealing principally sinus rhythm with episodes of PAF.  Based on this he has continued his Eliquis oral anticoagulation.  Otherwise, he is asymptomatic specifically denying chest pain or shortness of breath.  He did lose over 50 pounds in the weight watchers program beginning initially 245 and currently weighing 190.  He did have sleep apnea but since his weight loss he no longer wears CPAP.  Since I saw him virtually 20 half years ago he has put on some of the weight that he originally lost.  He denies chest pain or shortness of breath.  He remains on Eliquis and says he has occasional episodes of PAF.     Current Meds  Medication Sig   albuterol (PROAIR HFA) 108 (90 Base) MCG/ACT inhaler Inhale 2 puffs into the lungs every 4 (four) hours as needed for wheezing or shortness of breath.   apixaban (ELIQUIS) 5 MG TABS tablet TAKE 1 TABLET BY MOUTH TWICE A DAY    Ascorbic Acid (VITAMIN C) 1000 MG tablet Take 1,000 mg by mouth 2 (two) times daily.   azithromycin (ZITHROMAX) 250 MG tablet Take 1 tablet by mouth daily.   budesonide-formoterol (SYMBICORT) 160-4.5 MCG/ACT inhaler TAKE 2 PUFFS BY MOUTH TWICE A DAY   Cholecalciferol (VITAMIN D3) 125 MCG (5000 UT) TABS Take by mouth.   Coenzyme Q10 (CO Q-10) 100 MG CAPS Take 1 capsule by mouth daily.   dextromethorphan-guaiFENesin (MUCINEX DM) 30-600 MG 12hr tablet Take 1 tablet by mouth at bedtime as needed for cough.   FIBER COMPLETE PO Take 1 tablet by mouth daily.   glucosamine-chondroitin 500-400 MG tablet Take 1 tablet by mouth 3 (three) times daily.   hydrochlorothiazide (HYDRODIURIL) 25 MG tablet TAKE 1 TABLET BY MOUTH EVERY DAY   losartan (COZAAR) 100 MG tablet TAKE 1 TABLET BY MOUTH EVERY DAY   metoprolol succinate (TOPROL-XL) 25 MG 24 hr tablet TAKE 1 TABLET BY MOUTH EVERY DAY   montelukast (SINGULAIR) 10 MG tablet TAKE 1 TABLET BY MOUTH EVERY DAY   pantoprazole (PROTONIX) 40 MG tablet Take 1 tablet (40 mg total) by mouth 2 (two) times daily.   Respiratory Therapy Supplies (FLUTTER) DEVI Use as directed   rosuvastatin (CRESTOR) 20 MG tablet TAKE 1 TABLET BY MOUTH EVERY DAY **D/C LIPITOR**     No Known Allergies  Social History   Socioeconomic History   Marital status: Married    Spouse name: Not on file  Number of children: Not on file   Years of education: Not on file   Highest education level: Not on file  Occupational History   Not on file  Tobacco Use   Smoking status: Former    Packs/day: 2.00    Years: 11.00    Pack years: 22.00    Types: Cigarettes    Quit date: 12/19/1974    Years since quitting: 46.8   Smokeless tobacco: Never  Vaping Use   Vaping Use: Never used  Substance and Sexual Activity   Alcohol use: Yes    Alcohol/week: 1.0 standard drink    Types: 1 Glasses of wine per week   Drug use: No   Sexual activity: Not on file  Other Topics Concern   Not on file   Social History Narrative   Not on file   Social Determinants of Health   Financial Resource Strain: Low Risk    Difficulty of Paying Living Expenses: Not hard at all  Food Insecurity: Not on file  Transportation Needs: No Transportation Needs   Lack of Transportation (Medical): No   Lack of Transportation (Non-Medical): No  Physical Activity: Insufficiently Active   Days of Exercise per Week: 3 days   Minutes of Exercise per Session: 30 min  Stress: No Stress Concern Present   Feeling of Stress : Not at all  Social Connections: Moderately Integrated   Frequency of Communication with Friends and Family: More than three times a week   Frequency of Social Gatherings with Friends and Family: Three times a week   Attends Religious Services: More than 4 times per year   Active Member of Clubs or Organizations: No   Attends Archivist Meetings: Never   Marital Status: Married  Human resources officer Violence: Not At Risk   Fear of Current or Ex-Partner: No   Emotionally Abused: No   Physically Abused: No   Sexually Abused: No     Review of Systems: General: negative for chills, fever, night sweats or weight changes.  Cardiovascular: negative for chest pain, dyspnea on exertion, edema, orthopnea, palpitations, paroxysmal nocturnal dyspnea or shortness of breath Dermatological: negative for rash Respiratory: negative for cough or wheezing Urologic: negative for hematuria Abdominal: negative for nausea, vomiting, diarrhea, bright red blood per rectum, melena, or hematemesis Neurologic: negative for visual changes, syncope, or dizziness All other systems reviewed and are otherwise negative except as noted above.    Blood pressure (!) 142/78, pulse (!) 48, height 5\' 9"  (1.753 m), weight 235 lb 9.6 oz (106.9 kg), SpO2 95 %.  General appearance: alert and no distress Neck: no adenopathy, no carotid bruit, no JVD, supple, symmetrical, trachea midline, and thyroid not enlarged,  symmetric, no tenderness/mass/nodules Lungs: clear to auscultation bilaterally Heart: regular rate and rhythm, S1, S2 normal, no murmur, click, rub or gallop Extremities: extremities normal, atraumatic, no cyanosis or edema Pulses: 2+ and symmetric Skin: Skin color, texture, turgor normal. No rashes or lesions Neurologic: Grossly normal  EKG sinus bradycardia 48 with left axis deviation.  Personally reviewed this EKG.  ASSESSMENT AND PLAN:   Essential hypertension .History of essential hypertension a blood pressure measured today at 142/78 he is on hydrochlorothiazide and metoprolol.  Hyperlipidemia History of hyperlipidemia on statin therapy with lipid profile performed 4/at bedtime 22 revealing total cholesterol 133, LDL 57 and HDL of 60.  Paroxysmal atrial fibrillation (HCC) History of PAF maintaining sinus rhythm on Eliquis oral anticoagulation.  Obstructive sleep apnea History of obstructive sleep apnea previously on  CPAP which he no longer needs since he has had significant weight loss.     [name] MD FACP,FACC,FAHA, [facility] 10/22/2021 11:52 AM

## 2021-10-22 NOTE — Assessment & Plan Note (Signed)
History of hyperlipidemia on statin therapy with lipid profile performed 4/at bedtime 22 revealing total cholesterol 133, LDL 57 and HDL of 60.

## 2021-10-22 NOTE — Assessment & Plan Note (Signed)
.  History of essential hypertension a blood pressure measured today at 142/78 he is on hydrochlorothiazide and metoprolol.

## 2021-10-22 NOTE — Patient Instructions (Signed)

## 2021-10-22 NOTE — Assessment & Plan Note (Signed)
History of obstructive sleep apnea previously on CPAP which he no longer needs since he has had significant weight loss.

## 2021-10-22 NOTE — Assessment & Plan Note (Signed)
History of PAF maintaining sinus rhythm on Eliquis oral anticoagulation. 

## 2021-11-03 ENCOUNTER — Other Ambulatory Visit: Payer: Self-pay | Admitting: Family Medicine

## 2021-11-03 ENCOUNTER — Other Ambulatory Visit: Payer: Self-pay | Admitting: Cardiovascular Disease

## 2021-11-03 NOTE — Telephone Encounter (Signed)
Prescription refill request for Eliquis received. Indication:Afib Last office visit:11/22 Scr:1.0 Age: 75 Weight:106.9 kg  Prescription refilled

## 2021-11-18 DIAGNOSIS — H52223 Regular astigmatism, bilateral: Secondary | ICD-10-CM | POA: Diagnosis not present

## 2021-11-20 ENCOUNTER — Other Ambulatory Visit: Payer: Self-pay | Admitting: Family Medicine

## 2021-11-23 ENCOUNTER — Other Ambulatory Visit: Payer: Self-pay

## 2021-11-23 MED ORDER — METOPROLOL SUCCINATE ER 25 MG PO TB24
25.0000 mg | ORAL_TABLET | Freq: Every day | ORAL | 0 refills | Status: DC
Start: 2021-11-23 — End: 2022-03-04

## 2021-11-23 MED ORDER — HYDROCHLOROTHIAZIDE 25 MG PO TABS
25.0000 mg | ORAL_TABLET | Freq: Every day | ORAL | 0 refills | Status: DC
Start: 2021-11-23 — End: 2022-03-04

## 2022-02-09 ENCOUNTER — Other Ambulatory Visit: Payer: Self-pay | Admitting: Family Medicine

## 2022-02-24 ENCOUNTER — Encounter: Payer: Self-pay | Admitting: Gastroenterology

## 2022-03-04 ENCOUNTER — Other Ambulatory Visit: Payer: Self-pay | Admitting: Family Medicine

## 2022-05-03 ENCOUNTER — Other Ambulatory Visit: Payer: Self-pay | Admitting: Cardiovascular Disease

## 2022-06-02 ENCOUNTER — Other Ambulatory Visit: Payer: Self-pay | Admitting: Family Medicine

## 2022-06-02 NOTE — Telephone Encounter (Signed)
Requested Prescriptions  Pending Prescriptions Disp Refills  . hydrochlorothiazide (HYDRODIURIL) 25 MG tablet [Pharmacy Med Name: HYDROCHLOROTHIAZIDE 25 MG TAB] 90 tablet 0    Sig: TAKE 1 TABLET (25 MG TOTAL) BY MOUTH DAILY.     There is no refill protocol information for this order    . metoprolol succinate (TOPROL-XL) 25 MG 24 hr tablet [Pharmacy Med Name: METOPROLOL SUCC ER 25 MG TAB] 90 tablet 0    Sig: TAKE 1 TABLET (25 MG TOTAL) BY MOUTH DAILY.     There is no refill protocol information for this order

## 2022-06-13 ENCOUNTER — Other Ambulatory Visit: Payer: Self-pay | Admitting: Family Medicine

## 2022-06-20 ENCOUNTER — Telehealth: Payer: Self-pay

## 2022-06-20 MED ORDER — MONTELUKAST SODIUM 10 MG PO TABS: 10.0000 mg | ORAL_TABLET | Freq: Every day | ORAL | 0 refills | Status: DC

## 2022-06-20 NOTE — Addendum Note (Signed)
Addended by: Colman Cater on: 06/20/2022 12:20 PM   Modules accepted: Orders

## 2022-06-20 NOTE — Telephone Encounter (Signed)
Pharmacy faxed a refill request for montelukast (SINGULAIR) 10 MG tablet [[id number]]    Order Details Dose, Route, Frequency: As Directed  Dispense Quantity: 90 tablet Refills: 1        Sig: TAKE 1 TABLET BY MOUTH EVERY DAY       Start Date: 11/22/21 End Date: --  Written Date: 11/22/21 Expiration Date: 11/22/22

## 2022-07-18 ENCOUNTER — Ambulatory Visit: Payer: Self-pay

## 2022-07-18 DIAGNOSIS — R059 Cough, unspecified: Secondary | ICD-10-CM | POA: Diagnosis not present

## 2022-07-18 DIAGNOSIS — J209 Acute bronchitis, unspecified: Secondary | ICD-10-CM | POA: Diagnosis not present

## 2022-07-18 DIAGNOSIS — Z20822 Contact with and (suspected) exposure to covid-19: Secondary | ICD-10-CM | POA: Diagnosis not present

## 2022-07-18 NOTE — Telephone Encounter (Signed)
.   Chief Complaint: Cough/difficulty breathing Symptoms: Coughing up green phlegm Frequency: 7-10 days Pertinent Negatives: Patient denies  Disposition: '[]'$ ED /'[x]'$ Urgent Care (no appt availability in office) / '[]'$ Appointment(In office/virtual)/ '[]'$  [facility]/ '[]'$ Home Care/ '[]'$ Refused Recommended Disposition /'[]'$  Mobile Bus/ '[]'$  Follow-up with PCP Additional Notes: Pt believes he has a lung infection. PT is coughing up green phlegm. PT is having mild SOB. Hx of asthma.   Reason for Disposition  [1] MILD difficulty breathing (e.g., minimal/no SOB at rest, SOB with walking, pulse <100) AND [2] NEW-onset or WORSE than normal  Answer Assessment - Initial Assessment Questions 1. RESPIRATORY STATUS: "Describe your breathing?" (e.g., wheezing, shortness of breath, unable to speak, severe coughing)      cough 2. ONSET: "When did this breathing problem begin?"      7-10 days 3. PATTERN "Does the difficult breathing come and go, or has it been constant since it started?"      constant 4. SEVERITY: "How bad is your breathing?" (e.g., mild, moderate, severe)    - MILD: No SOB at rest, mild SOB with walking, speaks normally in sentences, can lie down, no retractions, pulse < 100.    - MODERATE: SOB at rest, SOB with minimal exertion and prefers to sit, cannot lie down flat, speaks in phrases, mild retractions, audible wheezing, pulse 100-120.    - SEVERE: Very SOB at rest, speaks in single words, struggling to breathe, sitting hunched forward, retractions, pulse > 120      5/10 5. RECURRENT SYMPTOM: "Have you had difficulty breathing before?" If Yes, ask: "When was the last time?" and "What happened that time?"       6. CARDIAC HISTORY: "Do you have any history of heart disease?" (e.g., heart attack, angina, bypass surgery, angioplasty)      Eliquis 7. LUNG HISTORY: "Do you have any history of lung disease?"  (e.g., pulmonary embolus, asthma, emphysema)     Asthma, pneumonia 8.  CAUSE: "What do you think is causing the breathing problem?"      Lung infection. 9. OTHER SYMPTOMS: "Do you have any other symptoms? (e.g., dizziness, runny nose, cough, chest pain, fever)      10. O2 SATURATION MONITOR:  "Do you use an oxygen saturation monitor (pulse oximeter) at home?" If Yes, ask: "What is your reading (oxygen level) today?" "What is your usual oxygen saturation reading?" (e.g., 95%)        11. PREGNANCY: "Is there any chance you are pregnant?" "When was your last menstrual period?"       na 12. TRAVEL: "Have you traveled out of the country in the last month?" (e.g., travel history, exposures)  Protocols used: Breathing Difficulty-A-AH

## 2022-07-19 ENCOUNTER — Other Ambulatory Visit: Payer: Self-pay | Admitting: Family Medicine

## 2022-07-20 NOTE — Telephone Encounter (Signed)
Spoke with pt this morning. Per pt went to UC yesterday, pt had meds to help w/symptoms.   Advice pt that between now and his appt on 07/28/22, if he is still not feeling well/symptoms worsening, to contact office.   Pt voiced understanding.

## 2022-07-28 ENCOUNTER — Ambulatory Visit (INDEPENDENT_AMBULATORY_CARE_PROVIDER_SITE_OTHER): Payer: No Typology Code available for payment source | Admitting: Family Medicine

## 2022-07-28 VITALS — BP 118/70 | HR 86 | Temp 98.1°F | Ht 69.0 in | Wt 242.0 lb

## 2022-07-28 DIAGNOSIS — J441 Chronic obstructive pulmonary disease with (acute) exacerbation: Secondary | ICD-10-CM | POA: Diagnosis not present

## 2022-07-28 DIAGNOSIS — R5383 Other fatigue: Secondary | ICD-10-CM

## 2022-07-28 DIAGNOSIS — I4891 Unspecified atrial fibrillation: Secondary | ICD-10-CM | POA: Diagnosis not present

## 2022-07-28 DIAGNOSIS — D500 Iron deficiency anemia secondary to blood loss (chronic): Secondary | ICD-10-CM | POA: Diagnosis not present

## 2022-07-28 DIAGNOSIS — E161 Other hypoglycemia: Secondary | ICD-10-CM | POA: Diagnosis not present

## 2022-07-28 MED ORDER — ALBUTEROL SULFATE HFA 108 (90 BASE) MCG/ACT IN AERS: 2.0000 | INHALATION_SPRAY | RESPIRATORY_TRACT | 2 refills | Status: DC | PRN

## 2022-07-28 MED ORDER — MONTELUKAST SODIUM 10 MG PO TABS: 10.0000 mg | ORAL_TABLET | Freq: Every day | ORAL | 0 refills | Status: DC

## 2022-07-28 MED ORDER — LOSARTAN POTASSIUM 100 MG PO TABS: 100.0000 mg | ORAL_TABLET | Freq: Every day | ORAL | 3 refills | Status: DC

## 2022-07-28 MED ORDER — ROSUVASTATIN CALCIUM 20 MG PO TABS: ORAL_TABLET | ORAL | 3 refills | Status: DC

## 2022-07-28 MED ORDER — METOPROLOL SUCCINATE ER 25 MG PO TB24: 25.0000 mg | ORAL_TABLET | Freq: Every day | ORAL | 0 refills | Status: DC

## 2022-07-28 MED ORDER — AMOXICILLIN-POT CLAVULANATE 875-125 MG PO TABS: 1.0000 | ORAL_TABLET | Freq: Two times a day (BID) | ORAL | 0 refills | Status: DC

## 2022-07-28 MED ORDER — PREDNISONE 20 MG PO TABS: ORAL_TABLET | ORAL | 0 refills | Status: DC

## 2022-07-28 MED ORDER — HYDROCHLOROTHIAZIDE 25 MG PO TABS: 25.0000 mg | ORAL_TABLET | Freq: Every day | ORAL | 0 refills | Status: DC

## 2022-07-28 NOTE — Progress Notes (Signed)
Subjective:    Patient ID: Mario [name], male    DOB: 06/21/1946, 76 y.o.   MRN: [id number] Patient is a 76 year old Caucasian gentleman with a history of bronchiectasis and COPD.  He recently had to go to an urgent care for a COPD exacerbation and was given a Z-Pak in addition to prednisone 20 mg a day for 5 days.  He states that his breathing is some better however his pulse oximetry is still 90% on room air.  He reports feeling weak and tired with no energy.  He states that for the last month he has become easily winded.  He has no energy and easy fatigability.  He denies any chest pain.  He denies any pleurisy.  He does have cough and occasional wheezing.  He denies any fevers or chills.  He denies any nausea or vomiting.  He denies any melena or hematochezia.  He denies any hematemesis.  He denies any angina.  He denies any bone pain or dysuria or hematuria.  Weight has been stable. Past Medical History:  Diagnosis Date   Asthma    Biliary colic 6283   COPD (chronic obstructive pulmonary disease) with chronic bronchitis (HCC)    Coronary artery calcification seen on CAT scan    Cyst of pancreas    1 cm (7/18), repeat ct in 12 months.     Hyperlipidemia    Hypertension    Obstructive sleep apnea    Renal disorder    Rhinitis    Sleep apnea    pt denies sleep apnea-lost weight   Past Surgical History:  Procedure Laterality Date   COLONOSCOPY  09/29/2014   LAPAROSCOPIC CHOLECYSTECTOMY SINGLE SITE WITH INTRAOPERATIVE CHOLANGIOGRAM N/A 09/15/2019   Procedure: LAPAROSCOPIC CHOLECYSTECTOMY SINGLE SITE;  Surgeon: Lindsey Boston, MD;  Location: [facility];  Service: General;  Laterality: N/A;   LITHOTRIPSY  2000   for kidney stone   Current Outpatient Medications on File Prior to Visit  Medication Sig Dispense Refill   Ascorbic Acid (VITAMIN C) 1000 MG tablet Take 1,000 mg by mouth 2 (two) times daily.     budesonide-formoterol (SYMBICORT) 160-4.5 MCG/ACT inhaler TAKE 2 PUFFS BY MOUTH TWICE  A DAY 10.2 each 6   Cholecalciferol (VITAMIN D3) 125 MCG (5000 UT) TABS Take by mouth.     Coenzyme Q10 (CO Q-10) 100 MG CAPS Take 1 capsule by mouth daily.     dextromethorphan-guaiFENesin (MUCINEX DM) 30-600 MG 12hr tablet Take 1 tablet by mouth at bedtime as needed for cough.     ELIQUIS 5 MG TABS tablet TAKE 1 TABLET BY MOUTH TWICE A DAY 60 tablet 5   FIBER COMPLETE PO Take 1 tablet by mouth daily.     glucosamine-chondroitin 500-400 MG tablet Take 1 tablet by mouth 3 (three) times daily.     pantoprazole (PROTONIX) 40 MG tablet TAKE 1 TABLET BY MOUTH TWICE A DAY 180 tablet 2   Respiratory Therapy Supplies (FLUTTER) DEVI Use as directed 1 each 0   No current facility-administered medications on file prior to visit.   No Known Allergies Social History   Socioeconomic History   Marital status: Married    Spouse name: Not on file   Number of children: Not on file   Years of education: Not on file   Highest education level: Not on file  Occupational History   Not on file  Tobacco Use   Smoking status: Former    Packs/day: 2.00    Years: 11.00  Total pack years: 22.00    Types: Cigarettes    Quit date: 12/19/1974    Years since quitting: 47.6   Smokeless tobacco: Never  Vaping Use   Vaping Use: Never used  Substance and Sexual Activity   Alcohol use: Yes    Alcohol/week: 1.0 standard drink of alcohol    Types: 1 Glasses of wine per week   Drug use: No   Sexual activity: Not on file  Other Topics Concern   Not on file  Social History Narrative   Not on file   Social Determinants of Health   Financial Resource Strain: Low Risk  (05/14/2021)   Overall Financial Resource Strain (CARDIA)    Difficulty of Paying Living Expenses: Not hard at all  Food Insecurity: No Food Insecurity (07/17/2020)   Hunger Vital Sign    Worried About Running Out of Food in the Last Year: Never true    Ran Out of Food in the Last Year: Never true  Transportation Needs: No Transportation Needs  (05/14/2021)   PRAPARE - Hydrologist (Medical): No    Lack of Transportation (Non-Medical): No  Physical Activity: Insufficiently Active (05/14/2021)   Exercise Vital Sign    Days of Exercise per Week: 3 days    Minutes of Exercise per Session: 30 min  Stress: No Stress Concern Present (05/14/2021)   [place]    Feeling of Stress : Not at all  Social Connections: Moderately Integrated (05/14/2021)   Social Connection and Isolation Panel [NHANES]    Frequency of Communication with Friends and Family: More than three times a week    Frequency of Social Gatherings with Friends and Family: Three times a week    Attends Religious Services: More than 4 times per year    Active Member of Clubs or Organizations: No    Attends Archivist Meetings: Never    Marital Status: Married  Human resources officer Violence: Not At Risk (05/14/2021)   Humiliation, Afraid, Rape, and Kick questionnaire    Fear of Current or Ex-Partner: No    Emotionally Abused: No    Physically Abused: No    Sexually Abused: No      Review of Systems  Respiratory:  Positive for cough.   All other systems reviewed and are negative.      Objective:   Physical Exam Vitals reviewed.  Constitutional:      Appearance: He is well-developed.  HENT:     Head: Normocephalic and atraumatic.     Nose: Nose normal.     Mouth/Throat:     Pharynx: No oropharyngeal exudate.  Neck:     Vascular: No carotid bruit.  Cardiovascular:     Rate and Rhythm: Normal rate. Rhythm irregular.     Heart sounds: Normal heart sounds.  Pulmonary:     Effort: Pulmonary effort is normal. No respiratory distress.     Breath sounds: Decreased air movement present. No stridor. Wheezing and rales present. No rhonchi.    Musculoskeletal:     Right lower leg: No edema.     Left lower leg: No edema.           Assessment & Plan:  Fatigue,  unspecified type - Plan: CBC with Differential/Platelet, COMPLETE METABOLIC PANEL WITH GFR, TSH, Hemoglobin A1c  COPD exacerbation (HCC) - Plan: albuterol (PROAIR HFA) 108 (90 Base) MCG/ACT inhaler  Atrial fibrillation, unspecified type ([place]) I believe the patient is  having a COPD exacerbation.  Increase prednisone 60 mg a day for the next 2 days and then taper the patient down quickly with a prednisone taper pack.  Given the right basilar crackles would add Augmentin to cover possible community-acquired pneumonia.  Given the substantial fatigue I am also due to check a CBC a CMP a TSH and an A1c.  However it is possible that his fatigue is most likely due to community-acquired pneumonia in the setting of COPD exacerbation.  If labs are normal I would recommend imaging of the chest.  Await the results of the response to the steroids and antibiotics and lab work before determining the next step in workup.  Presently his atrial fibrillation seems to be well-controlled

## 2022-07-29 ENCOUNTER — Other Ambulatory Visit: Payer: Self-pay

## 2022-07-29 DIAGNOSIS — N289 Disorder of kidney and ureter, unspecified: Secondary | ICD-10-CM

## 2022-08-01 ENCOUNTER — Other Ambulatory Visit: Payer: No Typology Code available for payment source

## 2022-08-01 DIAGNOSIS — K819 Cholecystitis, unspecified: Secondary | ICD-10-CM

## 2022-08-01 DIAGNOSIS — D500 Iron deficiency anemia secondary to blood loss (chronic): Secondary | ICD-10-CM

## 2022-08-01 DIAGNOSIS — N289 Disorder of kidney and ureter, unspecified: Secondary | ICD-10-CM | POA: Diagnosis not present

## 2022-08-01 LAB — COMPLETE METABOLIC PANEL WITH GFR
AG Ratio: 1.7 (calc) (ref 1.0–2.5)
ALT: 22 U/L (ref 9–46)
AST: 20 U/L (ref 10–35)
Albumin: 4 g/dL (ref 3.6–5.1)
Alkaline phosphatase (APISO): 41 U/L (ref 35–144)
BUN/Creatinine Ratio: 14 (calc) (ref 6–22)
BUN: 21 mg/dL (ref 7–25)
CO2: 27 mmol/L (ref 20–32)
Calcium: 9.3 mg/dL (ref 8.6–10.3)
Chloride: 105 mmol/L (ref 98–110)
Creat: 1.5 mg/dL — ABNORMAL HIGH (ref 0.70–1.28)
Globulin: 2.3 g/dL (calc) (ref 1.9–3.7)
Glucose, Bld: 111 mg/dL — ABNORMAL HIGH (ref 65–99)
Potassium: 4.2 mmol/L (ref 3.5–5.3)
Sodium: 140 mmol/L (ref 135–146)
Total Bilirubin: 0.7 mg/dL (ref 0.2–1.2)
Total Protein: 6.3 g/dL (ref 6.1–8.1)
eGFR: 48 mL/min/{1.73_m2} — ABNORMAL LOW (ref >=60)

## 2022-08-01 LAB — CBC WITH DIFFERENTIAL/PLATELET
Absolute Monocytes: 939 cells/uL (ref 200–950)
Basophils Absolute: 61 cells/uL (ref 0–200)
Basophils Relative: 0.5 %
Eosinophils Absolute: 110 cells/uL (ref 15–500)
Eosinophils Relative: 0.9 %
HCT: 40.2 % (ref 38.5–50.0)
Hemoglobin: 12.8 g/dL — ABNORMAL LOW (ref 13.2–17.1)
Lymphs Abs: 2147 cells/uL (ref 850–3900)
MCH: 25.3 pg — ABNORMAL LOW (ref 27.0–33.0)
MCHC: 31.8 g/dL — ABNORMAL LOW (ref 32.0–36.0)
MCV: 79.4 fL — ABNORMAL LOW (ref 80.0–100.0)
MPV: 9.8 fL (ref 7.5–12.5)
Monocytes Relative: 7.7 %
Neutro Abs: 8943 cells/uL — ABNORMAL HIGH (ref 1500–7800)
Neutrophils Relative %: 73.3 %
Platelets: 350 10*3/uL (ref 140–400)
RBC: 5.06 10*6/uL (ref 4.20–5.80)
RDW: 14.8 % (ref 11.0–15.0)
Total Lymphocyte: 17.6 %
WBC: 12.2 10*3/uL — ABNORMAL HIGH (ref 3.8–10.8)

## 2022-08-01 LAB — URINALYSIS, ROUTINE W REFLEX MICROSCOPIC
Bilirubin Urine: NEGATIVE
Glucose, UA: NEGATIVE
Hgb urine dipstick: NEGATIVE
Ketones, ur: NEGATIVE
Leukocytes,Ua: NEGATIVE
Nitrite: NEGATIVE
Protein, ur: NEGATIVE
Specific Gravity, Urine: 1.02 (ref 1.001–1.035)
pH: 5.5 (ref 5.0–8.0)

## 2022-08-01 LAB — HEMOGLOBIN A1C
Hgb A1c MFr Bld: 5.6 % of total Hgb (ref <5.7)
Mean Plasma Glucose: 114 mg/dL
eAG (mmol/L): 6.3 mmol/L

## 2022-08-01 LAB — IRON,TIBC AND FERRITIN PANEL
%SAT: 10 % (calc) — ABNORMAL LOW (ref 20–48)
Ferritin: 9 ng/mL — ABNORMAL LOW (ref 24–380)
Iron: 43 ug/dL — ABNORMAL LOW (ref 50–180)
TIBC: 448 mcg/dL (calc) — ABNORMAL HIGH (ref 250–425)

## 2022-08-01 LAB — TEST AUTHORIZATION

## 2022-08-01 LAB — TSH: TSH: 1.43 mIU/L (ref 0.40–4.50)

## 2022-08-02 ENCOUNTER — Other Ambulatory Visit: Payer: Self-pay

## 2022-08-02 DIAGNOSIS — D509 Iron deficiency anemia, unspecified: Secondary | ICD-10-CM

## 2022-08-02 MED ORDER — FERROUS GLUCONATE 324 (38 FE) MG PO TABS: 324.0000 mg | ORAL_TABLET | Freq: Every day | ORAL | 3 refills | Status: DC

## 2022-08-05 ENCOUNTER — Other Ambulatory Visit (HOSPITAL_COMMUNITY): Payer: Self-pay | Admitting: Dermatology

## 2022-08-05 ENCOUNTER — Ambulatory Visit (HOSPITAL_COMMUNITY)
Admission: RE | Admit: 2022-08-05 | Discharge: 2022-08-05 | Disposition: A | Discharge status: Home / Self Care | Payer: No Typology Code available for payment source | Source: Ambulatory Visit | Attending: Family Medicine | Admitting: Family Medicine

## 2022-08-05 DIAGNOSIS — N179 Acute kidney failure, unspecified: Secondary | ICD-10-CM | POA: Diagnosis not present

## 2022-08-05 DIAGNOSIS — R944 Abnormal results of kidney function studies: Secondary | ICD-10-CM | POA: Diagnosis not present

## 2022-08-05 DIAGNOSIS — N289 Disorder of kidney and ureter, unspecified: Secondary | ICD-10-CM | POA: Diagnosis not present

## 2022-08-31 ENCOUNTER — Other Ambulatory Visit: Payer: No Typology Code available for payment source

## 2022-08-31 DIAGNOSIS — D509 Iron deficiency anemia, unspecified: Secondary | ICD-10-CM | POA: Diagnosis not present

## 2022-08-31 NOTE — Addendum Note (Signed)
Addended by: Randal Buba K on: 08/31/2022 11:18 AM   Modules accepted: Orders

## 2022-09-01 ENCOUNTER — Other Ambulatory Visit: Payer: Self-pay | Admitting: Family Medicine

## 2022-09-01 DIAGNOSIS — R7989 Other specified abnormal findings of blood chemistry: Secondary | ICD-10-CM

## 2022-09-01 DIAGNOSIS — N289 Disorder of kidney and ureter, unspecified: Secondary | ICD-10-CM

## 2022-09-01 LAB — FECAL GLOBIN BY IMMUNOCHEMISTRY
FECAL GLOBIN RESULT:: NOT DETECTED
MICRO NUMBER:: 13911731
SPECIMEN QUALITY:: ADEQUATE

## 2022-09-07 DIAGNOSIS — Z6835 Body mass index (BMI) 35.0-35.9, adult: Secondary | ICD-10-CM | POA: Diagnosis not present

## 2022-09-07 DIAGNOSIS — J449 Chronic obstructive pulmonary disease, unspecified: Secondary | ICD-10-CM | POA: Diagnosis not present

## 2022-09-07 DIAGNOSIS — I4891 Unspecified atrial fibrillation: Secondary | ICD-10-CM | POA: Diagnosis not present

## 2022-09-07 DIAGNOSIS — I700 Atherosclerosis of aorta: Secondary | ICD-10-CM | POA: Diagnosis not present

## 2022-09-07 DIAGNOSIS — M1990 Unspecified osteoarthritis, unspecified site: Secondary | ICD-10-CM | POA: Diagnosis not present

## 2022-09-07 DIAGNOSIS — D6869 Other thrombophilia: Secondary | ICD-10-CM | POA: Diagnosis not present

## 2022-09-07 DIAGNOSIS — I129 Hypertensive chronic kidney disease with stage 1 through stage 4 chronic kidney disease, or unspecified chronic kidney disease: Secondary | ICD-10-CM | POA: Diagnosis not present

## 2022-09-07 DIAGNOSIS — K219 Gastro-esophageal reflux disease without esophagitis: Secondary | ICD-10-CM | POA: Diagnosis not present

## 2022-09-07 DIAGNOSIS — I2510 Atherosclerotic heart disease of native coronary artery without angina pectoris: Secondary | ICD-10-CM | POA: Diagnosis not present

## 2022-09-07 DIAGNOSIS — F17211 Nicotine dependence, cigarettes, in remission: Secondary | ICD-10-CM | POA: Diagnosis not present

## 2022-09-07 DIAGNOSIS — E785 Hyperlipidemia, unspecified: Secondary | ICD-10-CM | POA: Diagnosis not present

## 2022-09-07 DIAGNOSIS — Z008 Encounter for other general examination: Secondary | ICD-10-CM | POA: Diagnosis not present

## 2022-09-07 DIAGNOSIS — D649 Anemia, unspecified: Secondary | ICD-10-CM | POA: Diagnosis not present

## 2022-09-07 DIAGNOSIS — N1831 Chronic kidney disease, stage 3a: Secondary | ICD-10-CM | POA: Diagnosis not present

## 2022-09-07 DIAGNOSIS — Z7901 Long term (current) use of anticoagulants: Secondary | ICD-10-CM | POA: Diagnosis not present

## 2022-09-20 ENCOUNTER — Other Ambulatory Visit: Payer: Self-pay | Admitting: Family Medicine

## 2022-10-04 ENCOUNTER — Ambulatory Visit (INDEPENDENT_AMBULATORY_CARE_PROVIDER_SITE_OTHER): Payer: No Typology Code available for payment source | Admitting: Family Medicine

## 2022-10-04 VITALS — BP 128/80 | HR 69 | Temp 98.2°F | Ht 69.0 in | Wt 242.0 lb

## 2022-10-04 DIAGNOSIS — J471 Bronchiectasis with (acute) exacerbation: Secondary | ICD-10-CM | POA: Diagnosis not present

## 2022-10-04 MED ORDER — LEVOFLOXACIN 500 MG PO TABS: 500.0000 mg | ORAL_TABLET | Freq: Every day | ORAL | 0 refills | Status: AC

## 2022-10-04 NOTE — Progress Notes (Signed)
Subjective:    Patient ID: Mario [name], male    DOB: 1946/06/17, 76 y.o.   MRN: [id number] Patient is a 76 year old Caucasian gentleman with a history of bronchiectasis and COPD.  Patient reports a cough productive of green sputum.  He has a history of bronchiectasis.  He reports feeling lousy.  He denies any wheezing.  He denies any shortness of breath.  Today he has right-sided crackles in the middle lobe.  This is where the CT scan this is.  He denies any fever or chills but he does report feeling sick and tired and is concerned about the change in sputum characteristic in color.  This been going on now for about a week. Past Medical History:  Diagnosis Date   Asthma    Biliary colic 6270   COPD (chronic obstructive pulmonary disease) with chronic bronchitis (HCC)    Coronary artery calcification seen on CAT scan    Cyst of pancreas    1 cm (7/18), repeat ct in 12 months.     Hyperlipidemia    Hypertension    Obstructive sleep apnea    Renal disorder    Rhinitis    Sleep apnea    pt denies sleep apnea-lost weight   Past Surgical History:  Procedure Laterality Date   COLONOSCOPY  09/29/2014   LAPAROSCOPIC CHOLECYSTECTOMY SINGLE SITE WITH INTRAOPERATIVE CHOLANGIOGRAM N/A 09/15/2019   Procedure: LAPAROSCOPIC CHOLECYSTECTOMY SINGLE SITE;  Surgeon: Bentley Boston, MD;  Location: [facility];  Service: General;  Laterality: N/A;   LITHOTRIPSY  2000   for kidney stone   Current Outpatient Medications on File Prior to Visit  Medication Sig Dispense Refill   albuterol (PROAIR HFA) 108 (90 Base) MCG/ACT inhaler Inhale 2 puffs into the lungs every 4 (four) hours as needed for wheezing or shortness of breath. 8.5 each 2   Ascorbic Acid (VITAMIN C) 1000 MG tablet Take 1,000 mg by mouth 2 (two) times daily.     budesonide-formoterol (SYMBICORT) 160-4.5 MCG/ACT inhaler TAKE 2 PUFFS BY MOUTH TWICE A DAY 10.2 each 6   Cholecalciferol (VITAMIN D3) 125 MCG (5000 UT) TABS Take by mouth.     Coenzyme  Q10 (CO Q-10) 100 MG CAPS Take 1 capsule by mouth daily.     dextromethorphan-guaiFENesin (MUCINEX DM) 30-600 MG 12hr tablet Take 1 tablet by mouth at bedtime as needed for cough.     ELIQUIS 5 MG TABS tablet TAKE 1 TABLET BY MOUTH TWICE A DAY 60 tablet 5   ferrous gluconate (FERGON) 324 MG tablet Take 1 tablet (324 mg total) by mouth daily with breakfast. 30 tablet 3   FIBER COMPLETE PO Take 1 tablet by mouth daily.     glucosamine-chondroitin 500-400 MG tablet Take 1 tablet by mouth 3 (three) times daily.     hydrochlorothiazide (HYDRODIURIL) 25 MG tablet Take 1 tablet (25 mg total) by mouth daily. 90 tablet 0   losartan (COZAAR) 100 MG tablet Take 1 tablet (100 mg total) by mouth daily. 90 tablet 3   metoprolol succinate (TOPROL-XL) 25 MG 24 hr tablet Take 1 tablet (25 mg total) by mouth daily. 90 tablet 0   montelukast (SINGULAIR) 10 MG tablet TAKE 1 TABLET BY MOUTH EVERY DAY 30 tablet 0   pantoprazole (PROTONIX) 40 MG tablet TAKE 1 TABLET BY MOUTH TWICE A DAY 180 tablet 2   predniSONE (DELTASONE) 20 MG tablet 3 tabs poqday 1-2, 2 tabs poqday 3-4, 1 tab poqday 5-6 12 tablet 0   Respiratory Therapy  Supplies (FLUTTER) DEVI Use as directed 1 each 0   rosuvastatin (CRESTOR) 20 MG tablet TAKE 1 TABLET BY MOUTH EVERY DAY **D/C LIPITOR** 90 tablet 3   amoxicillin-clavulanate (AUGMENTIN) 875-125 MG tablet Take 1 tablet by mouth 2 (two) times daily. (Patient not taking: Reported on 10/04/2022) 20 tablet 0   No current facility-administered medications on file prior to visit.   No Known Allergies Social History   Socioeconomic History   Marital status: Married    Spouse name: Not on file   Number of children: Not on file   Years of education: Not on file   Highest education level: Not on file  Occupational History   Not on file  Tobacco Use   Smoking status: Former    Packs/day: 2.00    Years: 11.00    Total pack years: 22.00    Types: Cigarettes    Quit date: 12/19/1974    Years since  quitting: 47.8   Smokeless tobacco: Never  Vaping Use   Vaping Use: Never used  Substance and Sexual Activity   Alcohol use: Yes    Alcohol/week: 1.0 standard drink of alcohol    Types: 1 Glasses of wine per week   Drug use: No   Sexual activity: Not on file  Other Topics Concern   Not on file  Social History Narrative   Not on file   Social Determinants of Health   Financial Resource Strain: Low Risk  (05/14/2021)   Overall Financial Resource Strain (CARDIA)    Difficulty of Paying Living Expenses: Not hard at all  Food Insecurity: No Food Insecurity (07/17/2020)   Hunger Vital Sign    Worried About Running Out of Food in the Last Year: Never true    Ran Out of Food in the Last Year: Never true  Transportation Needs: No Transportation Needs (05/14/2021)   PRAPARE - Hydrologist (Medical): No    Lack of Transportation (Non-Medical): No  Physical Activity: Insufficiently Active (05/14/2021)   Exercise Vital Sign    Days of Exercise per Week: 3 days    Minutes of Exercise per Session: 30 min  Stress: No Stress Concern Present (05/14/2021)   [place]    Feeling of Stress : Not at all  Social Connections: Moderately Integrated (05/14/2021)   Social Connection and Isolation Panel [NHANES]    Frequency of Communication with Friends and Family: More than three times a week    Frequency of Social Gatherings with Friends and Family: Three times a week    Attends Religious Services: More than 4 times per year    Active Member of Clubs or Organizations: No    Attends Archivist Meetings: Never    Marital Status: Married  Human resources officer Violence: Not At Risk (05/14/2021)   Humiliation, Afraid, Rape, and Kick questionnaire    Fear of Current or Ex-Partner: No    Emotionally Abused: No    Physically Abused: No    Sexually Abused: No      Review of Systems  Respiratory:   Positive for cough.   All other systems reviewed and are negative.      Objective:   Physical Exam Vitals reviewed.  Constitutional:      Appearance: He is well-developed.  HENT:     Head: Normocephalic and atraumatic.     Nose: Nose normal.     Mouth/Throat:     Pharynx: No oropharyngeal exudate.  Neck:     Vascular: No carotid bruit.  Cardiovascular:     Rate and Rhythm: Normal rate. Rhythm irregular.     Heart sounds: Normal heart sounds.  Pulmonary:     Effort: Pulmonary effort is normal. No respiratory distress.     Breath sounds: Decreased air movement present. No stridor. Rales present. No wheezing or rhonchi.    Musculoskeletal:     Right lower leg: No edema.     Left lower leg: No edema.           Assessment & Plan:  Bronchiectasis with (acute) exacerbation ([place]) I see no indication to warrant prednisone but I will start the patient on Levaquin 500 mg daily for 7 days.  Recheck in 1 week if no better or sooner if worsening continue Mucinex and strongly encouraged the patient to use his flutter valve

## 2022-10-20 ENCOUNTER — Telehealth: Payer: Self-pay | Admitting: Family Medicine

## 2022-10-20 NOTE — Telephone Encounter (Signed)
Left message to return call; need to schedule Medicare AWV.

## 2022-10-22 ENCOUNTER — Other Ambulatory Visit: Payer: Self-pay | Admitting: Family Medicine

## 2022-10-24 NOTE — Telephone Encounter (Signed)
Requested Prescriptions  Pending Prescriptions Disp Refills   montelukast (SINGULAIR) 10 MG tablet [Pharmacy Med Name: MONTELUKAST SOD 10 MG TABLET] 90 tablet 3    Sig: TAKE 1 TABLET BY MOUTH EVERY DAY     Pulmonology:  Leukotriene Inhibitors Failed - 10/22/2022  9:52 AM      Failed - Valid encounter within last 12 months    Recent Outpatient Visits           1 year ago Pure hypercholesterolemia   [place] Pickard, [name], MD   2 years ago [place], [name], MD   4 years ago Coronary artery calcification seen on CAT scan   [place] [name], MD   4 years ago Community acquired pneumonia of left lower lobe of lung ([place])   [place] Delsa Grana, PA-C   4 years ago Dyspnea, unspecified type   [place] Pickard, [name], MD

## 2022-10-25 ENCOUNTER — Encounter: Payer: Self-pay | Admitting: Physician Assistant

## 2022-10-29 ENCOUNTER — Other Ambulatory Visit: Payer: Self-pay | Admitting: Cardiovascular Disease

## 2022-10-29 DIAGNOSIS — I480 Paroxysmal atrial fibrillation: Secondary | ICD-10-CM

## 2022-10-31 ENCOUNTER — Other Ambulatory Visit: Payer: Self-pay

## 2022-10-31 NOTE — Telephone Encounter (Signed)
  Prescription Request  10/31/2022  Is this a "Controlled Substance" medicine? No  LOV: 10/22/2022   What is the name of the medication or equipment? pantoprazole (PROTONIX) 40 MG tablet [[id number]]   Have you contacted your pharmacy to request a refill? No   Which pharmacy would you like this sent to?  [id number] [place] [address]Phone: [phone]: [phone]  Patient notified that their request is being sent to the clinical staff for review and that they should receive a response within 2 business days.   Please advise at [phone]

## 2022-10-31 NOTE — Telephone Encounter (Signed)
Eliquis '5mg'$  refill request received. Patient is 76 years old, weight-109.8kg, Crea-1.50 on 07/28/2022, Diagnosis-Afib, and last seen by Dr. Gwenlyn Found on 10/22/2021 and pending an appt 01/17/2023. Dose is appropriate based on dosing criteria. Will send in refill to requested pharmacy.

## 2022-11-01 MED ORDER — PANTOPRAZOLE SODIUM 40 MG PO TBEC: 40.0000 mg | DELAYED_RELEASE_TABLET | Freq: Two times a day (BID) | ORAL | 0 refills | Status: DC

## 2022-11-01 NOTE — Telephone Encounter (Signed)
Requested Prescriptions  Pending Prescriptions Disp Refills   pantoprazole (PROTONIX) 40 MG tablet 180 tablet 0    Sig: Take 1 tablet (40 mg total) by mouth 2 (two) times daily.     Gastroenterology: Proton Pump Inhibitors Failed - 10/31/2022 12:57 PM      Failed - Valid encounter within last 12 months    Recent Outpatient Visits           1 year ago Pure hypercholesterolemia   [place] Pickard, [name], MD   2 years ago [place], [name], MD   4 years ago Coronary artery calcification seen on CAT scan   [place] [name], MD   4 years ago Community acquired pneumonia of left lower lobe of lung ([place])   [place] Delsa Grana, PA-C   4 years ago Dyspnea, unspecified type   [place] Pickard, [name], MD       Future Appointments             In 2 months Gwenlyn Found, Pearletha Forge, MD [facility] [place]. [place]

## 2022-11-07 DIAGNOSIS — H25813 Combined forms of age-related cataract, bilateral: Secondary | ICD-10-CM | POA: Diagnosis not present

## 2022-11-23 ENCOUNTER — Ambulatory Visit (INDEPENDENT_AMBULATORY_CARE_PROVIDER_SITE_OTHER): Payer: No Typology Code available for payment source

## 2022-11-23 VITALS — Ht 69.0 in | Wt 242.0 lb

## 2022-11-23 DIAGNOSIS — Z0000 Encounter for general adult medical examination without abnormal findings: Secondary | ICD-10-CM

## 2022-11-23 NOTE — Patient Instructions (Signed)
Mario [name] , Thank you for taking time to come for your Medicare Wellness Visit. I appreciate your ongoing commitment to your health goals. Please review the following plan we discussed and let me know if I can assist you in the future.   These are the goals we discussed:  Goals      Exercise 150 min/wk Moderate Activity     11/23/22-Pt states he wants to increase exercise and lose weight this year.      Weight (lb) < 200 lb (90.7 kg)        This is a list of the screening recommended for you and due dates:  Health Maintenance  Topic Date Due   Zoster (Shingles) Vaccine (1 of 2) 01/04/2023*   COVID-19 Vaccine (3 - Pfizer risk series) 01/18/2023*   Flu Shot  03/19/2023*   Hepatitis C Screening: USPSTF Recommendation to screen - Ages 18-79 yo.  10/05/2023*   Medicare Annual Wellness Visit  11/24/2023   Pneumonia Vaccine  Completed   HPV Vaccine  Aged Out   DTaP/Tdap/Td vaccine  Discontinued   Colon Cancer Screening  Discontinued  *Topic was postponed. The date shown is not the original due date.    Advanced directives: Please bring a copy of your health care power of attorney and living will to the office to be added to your chart at your convenience.   Conditions/risks identified: Aim for 30 minutes of exercise or brisk walking, 6-8 glasses of water, and 5 servings of fruits and vegetables each day.   Next appointment: Follow up in one year for your annual wellness visit. 11/2023.  Preventive Care 46 Years and Older, Male  Preventive care refers to lifestyle choices and visits with your health care provider that can promote health and wellness. What does preventive care include? A yearly physical exam. This is also called an annual well check. Dental exams once or twice a year. Routine eye exams. Ask your health care provider how often you should have your eyes checked. Personal lifestyle choices, including: Daily care of your teeth and gums. Regular physical  activity. Eating a healthy diet. Avoiding tobacco and drug use. Limiting alcohol use. Practicing safe sex. Taking low doses of aspirin every day. Taking vitamin and mineral supplements as recommended by your health care provider. What happens during an annual well check? The services and screenings done by your health care provider during your annual well check will depend on your age, overall health, lifestyle risk factors, and family history of disease. Counseling  Your health care provider may ask you questions about your: Alcohol use. Tobacco use. Drug use. Emotional well-being. Home and relationship well-being. Sexual activity. Eating habits. History of falls. Memory and ability to understand (cognition). Work and work Statistician. Screening  You may have the following tests or measurements: Height, weight, and BMI. Blood pressure. Lipid and cholesterol levels. These may be checked every 5 years, or more frequently if you are over 77 years old. Skin check. Lung cancer screening. You may have this screening every year starting at age 71 if you have a 30-pack-year history of smoking and currently smoke or have quit within the past 15 years. Fecal occult blood test (FOBT) of the stool. You may have this test every year starting at age 15. Flexible sigmoidoscopy or colonoscopy. You may have a sigmoidoscopy every 5 years or a colonoscopy every 10 years starting at age 65. Prostate cancer screening. Recommendations will vary depending on your family history and other risks. Hepatitis  C blood test. Hepatitis B blood test. Sexually transmitted disease (STD) testing. Diabetes screening. This is done by checking your blood sugar (glucose) after you have not eaten for a while (fasting). You may have this done every 1-3 years. Abdominal aortic aneurysm (AAA) screening. You may need this if you are a current or former smoker. Osteoporosis. You may be screened starting at age 54 if you are  at high risk. Talk with your health care provider about your test results, treatment options, and if necessary, the need for more tests. Vaccines  Your health care provider may recommend certain vaccines, such as: Influenza vaccine. This is recommended every year. Tetanus, diphtheria, and acellular pertussis (Tdap, Td) vaccine. You may need a Td booster every 10 years. Zoster vaccine. You may need this after age 70. Pneumococcal 13-valent conjugate (PCV13) vaccine. One dose is recommended after age 23. Pneumococcal polysaccharide (PPSV23) vaccine. One dose is recommended after age 32. Talk to your health care provider about which screenings and vaccines you need and how often you need them. This information is not intended to replace advice given to you by your health care provider. Make sure you discuss any questions you have with your health care provider. Document Released: 01/01/2016 Document Revised: 08/24/2016 Document Reviewed: 10/06/2015 Elsevier Interactive Patient Education  2017 [employer] Prevention in the Home Falls can cause injuries. They can happen to people of all ages. There are many things you can do to make your home safe and to help prevent falls. What can I do on the outside of my home? Regularly fix the edges of walkways and driveways and fix any cracks. Remove anything that might make you trip as you walk through a door, such as a raised step or threshold. Trim any bushes or trees on the path to your home. Use bright outdoor lighting. Clear any walking paths of anything that might make someone trip, such as rocks or tools. Regularly check to see if handrails are loose or broken. Make sure that both sides of any steps have handrails. Any raised decks and porches should have guardrails on the edges. Have any leaves, snow, or ice cleared regularly. Use sand or salt on walking paths during winter. Clean up any spills in your garage right away. This includes oil  or grease spills. What can I do in the bathroom? Use night lights. Install grab bars by the toilet and in the tub and shower. Do not use towel bars as grab bars. Use non-skid mats or decals in the tub or shower. If you need to sit down in the shower, use a plastic, non-slip stool. Keep the floor dry. Clean up any water that spills on the floor as soon as it happens. Remove soap buildup in the tub or shower regularly. Attach bath mats securely with double-sided non-slip rug tape. Do not have throw rugs and other things on the floor that can make you trip. What can I do in the bedroom? Use night lights. Make sure that you have a light by your bed that is easy to reach. Do not use any sheets or blankets that are too big for your bed. They should not hang down onto the floor. Have a firm chair that has side arms. You can use this for support while you get dressed. Do not have throw rugs and other things on the floor that can make you trip. What can I do in the kitchen? Clean up any spills right away. Avoid walking  on wet floors. Keep items that you use a lot in easy-to-reach places. If you need to reach something above you, use a strong step stool that has a grab bar. Keep electrical cords out of the way. Do not use floor polish or wax that makes floors slippery. If you must use wax, use non-skid floor wax. Do not have throw rugs and other things on the floor that can make you trip. What can I do with my stairs? Do not leave any items on the stairs. Make sure that there are handrails on both sides of the stairs and use them. Fix handrails that are broken or loose. Make sure that handrails are as long as the stairways. Check any carpeting to make sure that it is firmly attached to the stairs. Fix any carpet that is loose or worn. Avoid having throw rugs at the top or bottom of the stairs. If you do have throw rugs, attach them to the floor with carpet tape. Make sure that you have a light  switch at the top of the stairs and the bottom of the stairs. If you do not have them, ask someone to add them for you. What else can I do to help prevent falls? Wear shoes that: Do not have high heels. Have rubber bottoms. Are comfortable and fit you well. Are closed at the toe. Do not wear sandals. If you use a stepladder: Make sure that it is fully opened. Do not climb a closed stepladder. Make sure that both sides of the stepladder are locked into place. Ask someone to hold it for you, if possible. Clearly mark and make sure that you can see: Any grab bars or handrails. First and last steps. Where the edge of each step is. Use tools that help you move around (mobility aids) if they are needed. These include: Canes. Walkers. Scooters. Crutches. Turn on the lights when you go into a dark area. Replace any light bulbs as soon as they burn out. Set up your furniture so you have a clear path. Avoid moving your furniture around. If any of your floors are uneven, fix them. If there are any pets around you, be aware of where they are. Review your medicines with your doctor. Some medicines can make you feel dizzy. This can increase your chance of falling. Ask your doctor what other things that you can do to help prevent falls. This information is not intended to replace advice given to you by your health care provider. Make sure you discuss any questions you have with your health care provider. Document Released: 10/01/2009 Document Revised: 05/12/2016 Document Reviewed: 01/09/2015 Elsevier Interactive Patient Education  2017 [employer].

## 2022-11-23 NOTE — Progress Notes (Signed)
Subjective:   Mario [name] is a 76 y.o. male who presents for Medicare Annual/Subsequent preventive examination. Virtual Visit via Telephone Note  I connected with  Mario [name] on 11/23/22 at  3:30 PM EST by telephone and verified that I am speaking with the correct person using two identifiers.  Location: Patient: HOME  Provider: [facility] Persons participating in the virtual visit: patient/Nurse Health Advisor   I discussed the limitations, risks, security and privacy concerns of performing an evaluation and management service by telephone and the availability of in person appointments. The patient expressed understanding and agreed to proceed.  Interactive audio and video telecommunications were attempted between this nurse and patient, however failed, due to patient having technical difficulties OR patient did not have access to video capability.  We continued and completed visit with audio only.  Some vital signs may be absent or patient reported.   Chriss Driver, LPN  Review of Systems     Cardiac Risk Factors include: advanced age (>71mn, >>28women);hypertension;dyslipidemia;male gender;obesity (BMI >30kg/m2)     Objective:    Today's Vitals   11/23/22 1518 11/23/22 1519  Weight: 242 lb (109.8 kg)   Height: '5\' 9"'$  (1.753 m)   PainSc:  4    Body mass index is 35.74 kg/m.     11/23/2022    3:24 PM 05/14/2021    2:51 PM 07/17/2020    2:35 PM 09/14/2019    5:00 AM 01/22/2019   12:22 PM 09/29/2014    8:46 AM 09/15/2014   10:36 AM  Advanced Directives  Does Patient Have a Medical Advance Directive? Yes Yes Yes Yes Yes Yes Yes  Type of AParamedicof ACraigLiving will HMatewanLiving will HSneadsLiving will H[place]Living will H[place] BranchLiving will HLynnLiving will Living will;Mental Health Advance Directive  Does patient want to make  changes to medical advance directive?  No - Patient declined No - Patient declined No - Patient declined     Copy of [place] Chart? No - copy requested No - copy requested         Current Medications (verified) Outpatient Encounter Medications as of 11/23/2022  Medication Sig   albuterol (PROAIR HFA) 108 (90 Base) MCG/ACT inhaler Inhale 2 puffs into the lungs every 4 (four) hours as needed for wheezing or shortness of breath.   Ascorbic Acid (VITAMIN C) 1000 MG tablet Take 1,000 mg by mouth 2 (two) times daily.   budesonide-formoterol (SYMBICORT) 160-4.5 MCG/ACT inhaler TAKE 2 PUFFS BY MOUTH TWICE A DAY   Cholecalciferol (VITAMIN D3) 125 MCG (5000 UT) TABS Take by mouth.   Coenzyme Q10 (CO Q-10) 100 MG CAPS Take 1 capsule by mouth daily.   ELIQUIS 5 MG TABS tablet TAKE 1 TABLET BY MOUTH TWICE A DAY   ferrous gluconate (FERGON) 324 MG tablet Take 1 tablet (324 mg total) by mouth daily with breakfast.   FIBER COMPLETE PO Take 1 tablet by mouth daily.   glucosamine-chondroitin 500-400 MG tablet Take 1 tablet by mouth 3 (three) times daily.   hydrochlorothiazide (HYDRODIURIL) 25 MG tablet Take 1 tablet (25 mg total) by mouth daily.   losartan (COZAAR) 100 MG tablet Take 1 tablet (100 mg total) by mouth daily.   metoprolol succinate (TOPROL-XL) 25 MG 24 hr tablet Take 1 tablet (25 mg total) by mouth daily.   montelukast (SINGULAIR) 10 MG tablet TAKE 1  TABLET BY MOUTH EVERY DAY   pantoprazole (PROTONIX) 40 MG tablet Take 1 tablet (40 mg total) by mouth 2 (two) times daily.   Respiratory Therapy Supplies (FLUTTER) DEVI Use as directed   rosuvastatin (CRESTOR) 20 MG tablet TAKE 1 TABLET BY MOUTH EVERY DAY **D/C LIPITOR**   No facility-administered encounter medications on file as of 11/23/2022.    Allergies (verified) Patient has no known allergies.   History: Past Medical History:  Diagnosis Date   Asthma    Biliary colic 2035   COPD (chronic obstructive pulmonary  disease) with chronic bronchitis    Coronary artery calcification seen on CAT scan    Cyst of pancreas    1 cm (7/18), repeat ct in 12 months.     Hyperlipidemia    Hypertension    Obstructive sleep apnea    Renal disorder    Rhinitis    Sleep apnea    pt denies sleep apnea-lost weight   Past Surgical History:  Procedure Laterality Date   COLONOSCOPY  09/29/2014   LAPAROSCOPIC CHOLECYSTECTOMY SINGLE SITE WITH INTRAOPERATIVE CHOLANGIOGRAM N/A 09/15/2019   Procedure: LAPAROSCOPIC CHOLECYSTECTOMY SINGLE SITE;  Surgeon: Ilir Boston, MD;  Location: [facility];  Service: General;  Laterality: N/A;   LITHOTRIPSY  2000   for kidney stone   Family History  Problem Relation Age of Onset   Rheumatic fever Mother    Stroke Father    Atrial fibrillation Father    Colon polyps Father    Arthritis Sister    Colon cancer Neg Hx    Esophageal cancer Neg Hx    Rectal cancer Neg Hx    Stomach cancer Neg Hx    Social History   Socioeconomic History   Marital status: Married    Spouse name: Not on file   Number of children: Not on file   Years of education: Not on file   Highest education level: Not on file  Occupational History   Not on file  Tobacco Use   Smoking status: Former    Packs/day: 2.00    Years: 11.00    Total pack years: 22.00    Types: Cigarettes    Quit date: 12/19/1974    Years since quitting: 47.9   Smokeless tobacco: Never  Vaping Use   Vaping Use: Never used  Substance and Sexual Activity   Alcohol use: Yes    Alcohol/week: 1.0 standard drink of alcohol    Types: 1 Glasses of wine per week   Drug use: No   Sexual activity: Not on file  Other Topics Concern   Not on file  Social History Narrative   Not on file   Social Determinants of Health   Financial Resource Strain: Medium Risk (11/23/2022)   Overall Financial Resource Strain (CARDIA)    Difficulty of Paying Living Expenses: Somewhat hard  Food Insecurity: No Food Insecurity (11/23/2022)   Hunger Vital  Sign    Worried About Running Out of Food in the Last Year: Never true    Ran Out of Food in the Last Year: Never true  Transportation Needs: No Transportation Needs (11/23/2022)   PRAPARE - Hydrologist (Medical): No    Lack of Transportation (Non-Medical): No  Physical Activity: Insufficiently Active (11/23/2022)   Exercise Vital Sign    Days of Exercise per Week: 2 days    Minutes of Exercise per Session: 20 min  Stress: No Stress Concern Present (11/23/2022)   [employer]  [employer] - Occupational Stress Questionnaire    Feeling of Stress : Only a little  Social Connections: Socially Integrated (11/23/2022)   Social Connection and Isolation Panel [NHANES]    Frequency of Communication with Friends and Family: More than three times a week    Frequency of Social Gatherings with Friends and Family: More than three times a week    Attends Religious Services: More than 4 times per year    Active Member of [employer] or Organizations: Yes    Attends Music therapist: More than 4 times per year    Marital Status: Married    Tobacco Counseling Counseling given: Not Answered   Clinical Intake:  Pre-visit preparation completed: Yes  Pain : 0-10 Pain Score: 4  Pain Type: Chronic pain Pain Location: Knee Pain Descriptors / Indicators: Aching Pain Onset: More than a month ago Pain Frequency: Intermittent     BMI - recorded: 35.74 Nutritional Status: BMI > 30  Obese Nutritional Risks: None Diabetes: No  How often do you need to have someone help you when you read instructions, pamphlets, or other written materials from your doctor or pharmacy?: 1 - Never  Diabetic?NO  Interpreter Needed?: No  Information entered by :: Mario Braelyn Bordonaro, lpn   Activities of Daily Living    11/23/2022    3:24 PM 11/22/2022   11:50 AM  In your present state of health, do you have any difficulty performing the following activities:  Hearing? 0 0   Vision? 0 0  Difficulty concentrating or making decisions? 0 0  Walking or climbing stairs? 0 0  Dressing or bathing? 0 0  Doing errands, shopping? 0 0  Preparing Food and eating ? N N  Using the Toilet? N N  In the past six months, have you accidently leaked urine? N Y  Do you have problems with loss of bowel control? N N  Managing your Medications? N N  Managing your Finances? N N  Housekeeping or managing your Housekeeping? N N    Patient Care Team: [name], MD as PCP - General (Family Medicine) Tanda Rockers, MD as Consulting Physician (Pulmonary Disease) [name], MD as Consulting Physician (Cardiology) Milus Banister, MD as Consulting Physician (Gastroenterology)  Indicate any recent Medical Services you may have received from other than [facility] providers in the past year (date may be approximate).     Assessment:   This is a routine wellness examination for Mario [name].  Hearing/Vision screen Hearing Screening - Comments:: No hearing issues.  Vision Screening - Comments:: Glasses. [employer].   Dietary issues and exercise activities discussed: Current Exercise Habits: Home exercise routine, Type of exercise: walking, Time (Minutes): 20, Frequency (Times/Week): 2, Weekly Exercise (Minutes/Week): 40, Intensity: Mild, Exercise limited by: cardiac condition(s)   Goals Addressed             This Visit's Progress    Exercise 150 min/wk Moderate Activity   Not on track    11/23/22-Pt states he wants to increase exercise and lose weight this year.        Depression Screen    11/23/2022    3:21 PM 05/14/2021    2:53 PM 03/26/2021    8:37 AM 07/17/2020    2:35 PM 10/19/2017    2:05 PM 07/25/2014    2:47 PM  PHQ 2/9 Scores  PHQ - 2 Score 0 0 0 0 0 0    Fall Risk    11/23/2022  3:24 PM 11/22/2022   11:50 AM 05/14/2021    2:52 PM 03/26/2021    8:37 AM 10/19/2017    2:05 PM  Fall Risk   Falls in the past year? 0 0 0 1 No  Number falls in  past yr: 0  0 0   Injury with Fall? 0 0 0 0   Risk for fall due to : No Fall Risks  No Fall Risks Impaired balance/gait;History of fall(s)   Follow up Falls prevention discussed  Falls evaluation completed;Falls prevention discussed Falls evaluation completed     FALL RISK PREVENTION PERTAINING TO THE HOME:  Any stairs in or around the home? Yes  If so, are there any without handrails? No  Home free of loose throw rugs in walkways, pet beds, electrical cords, etc? Yes  Adequate lighting in your home to reduce risk of falls? Yes   ASSISTIVE DEVICES UTILIZED TO PREVENT FALLS:  Life alert? No  Use of a cane, walker or w/c? No  Grab bars in the bathroom? No  Shower chair or bench in shower? No  Elevated toilet seat or a handicapped toilet? No   TIMED UP AND GO:  Was the test performed? No .  Phone visit  Cognitive Function:        11/23/2022    3:26 PM  6CIT Screen  What Year? 0 points  What month? 0 points  What time? 0 points  Count back from 20 0 points  Months in reverse 0 points  Repeat phrase 0 points  Total Score 0 points    Immunizations Immunization History  Administered Date(s) Administered   Fluad Quad(high Dose 65+) 09/09/2019   Influenza Split 09/09/2013   Influenza, High Dose Seasonal PF 09/22/2015, 09/27/2016, 09/27/2016, 09/11/2017   Influenza,inj,Quad PF,6+ Mos 09/03/2018   Influenza-Unspecified 09/18/2014   PFIZER(Purple Top)SARS-COV-2 Vaccination 02/05/2020, 02/26/2020   Pneumococcal Conjugate-13 02/17/2014   Pneumococcal Polysaccharide-23 10/13/2009, 04/16/2015   Tdap 03/13/2007   Zoster, Live 05/29/2015    TDAP status: Due, Education has been provided regarding the importance of this vaccine. Advised may receive this vaccine at local pharmacy or Health Dept. Aware to provide a copy of the vaccination record if obtained from local pharmacy or Health Dept. Verbalized acceptance and understanding.  Flu Vaccine status: Due, Education has been  provided regarding the importance of this vaccine. Advised may receive this vaccine at local pharmacy or Health Dept. Aware to provide a copy of the vaccination record if obtained from local pharmacy or Health Dept. Verbalized acceptance and understanding.  Pneumococcal vaccine status: Up to date  Covid-19 vaccine status: Completed vaccines  Qualifies for Shingles Vaccine? Yes   Zostavax completed Yes   Shingrix Completed?: No.    Education has been provided regarding the importance of this vaccine. Patient has been advised to call insurance company to determine out of pocket expense if they have not yet received this vaccine. Advised may also receive vaccine at local pharmacy or Health Dept. Verbalized acceptance and understanding.  Screening Tests Health Maintenance  Topic Date Due   Zoster Vaccines- Shingrix (1 of 2) 01/04/2023 (Originally 03/06/1965)   COVID-19 Vaccine (3 - Pfizer risk series) 01/18/2023 (Originally 03/25/2020)   INFLUENZA VACCINE  03/19/2023 (Originally 07/19/2022)   Hepatitis C Screening  10/05/2023 (Originally 03/07/1964)   Medicare Annual Wellness (AWV)  11/24/2023   Pneumonia Vaccine 89+ Years old  Completed   HPV VACCINES  Aged Out   DTaP/Tdap/Td  Discontinued   COLONOSCOPY (Pts 45-5yr Insurance coverage will  need to be confirmed)  Discontinued    Health Maintenance  There are no preventive care reminders to display for this patient.   Colorectal cancer screening: No longer required.   Lung Cancer Screening: (Low Dose CT Chest recommended if Age 54-80 years, 30 pack-year currently smoking OR have quit w/in 15years.) does not qualify.   Lung Cancer Screening Referral: N/A  Additional Screening:  Hepatitis C Screening: does qualify; Completed N/A  Vision Screening: Recommended annual ophthalmology exams for early detection of glaucoma and other disorders of the eye. Is the patient up to date with their annual eye exam?  Yes  Who is the provider or what is  the name of the office in which the patient attends annual eye exams? [facility]. If pt is not established with a provider, would they like to be referred to a provider to establish care? No .   Dental Screening: Recommended annual dental exams for proper oral hygiene  Community Resource Referral / Chronic Care Management: CRR required this visit?  No   CCM required this visit?  No      Plan:     I have personally reviewed and noted the following in the patient's chart:   Medical and social history Use of alcohol, tobacco or illicit drugs  Current medications and supplements including opioid prescriptions. Patient is not currently taking opioid prescriptions. Functional ability and status Nutritional status Physical activity Advanced directives List of other physicians Hospitalizations, surgeries, and ER visits in previous 12 months Vitals Screenings to include cognitive, depression, and falls Referrals and appointments  In addition, I have reviewed and discussed with patient certain preventive protocols, quality metrics, and best practice recommendations. A written personalized care plan for preventive services as well as general preventive health recommendations were provided to patient.     Chriss Driver, LPN   [id number]   Nurse Notes: Discussed Shingrix, flu and covid vaccines and how to obtain.

## 2022-11-27 ENCOUNTER — Other Ambulatory Visit: Payer: Self-pay | Admitting: Family Medicine

## 2022-11-27 DIAGNOSIS — D509 Iron deficiency anemia, unspecified: Secondary | ICD-10-CM

## 2022-11-28 ENCOUNTER — Other Ambulatory Visit: Payer: Self-pay | Admitting: Family Medicine

## 2022-11-30 ENCOUNTER — Encounter: Payer: Self-pay | Admitting: Physician Assistant

## 2022-11-30 ENCOUNTER — Telehealth: Payer: Self-pay

## 2022-11-30 ENCOUNTER — Ambulatory Visit (INDEPENDENT_AMBULATORY_CARE_PROVIDER_SITE_OTHER): Payer: No Typology Code available for payment source | Admitting: Physician Assistant

## 2022-11-30 VITALS — BP 110/60 | HR 57 | Ht 69.0 in | Wt 245.0 lb

## 2022-11-30 DIAGNOSIS — Z86010 Personal history of colonic polyps: Secondary | ICD-10-CM

## 2022-11-30 DIAGNOSIS — I480 Paroxysmal atrial fibrillation: Secondary | ICD-10-CM

## 2022-11-30 DIAGNOSIS — J4489 Other specified chronic obstructive pulmonary disease: Secondary | ICD-10-CM | POA: Diagnosis not present

## 2022-11-30 DIAGNOSIS — D500 Iron deficiency anemia secondary to blood loss (chronic): Secondary | ICD-10-CM

## 2022-11-30 DIAGNOSIS — R194 Change in bowel habit: Secondary | ICD-10-CM | POA: Diagnosis not present

## 2022-11-30 MED ORDER — NA SULFATE-K SULFATE-MG SULF 17.5-3.13-1.6 GM/177ML PO SOLN: 1.0000 | Freq: Once | ORAL | 0 refills | Status: AC

## 2022-11-30 NOTE — Telephone Encounter (Signed)
[facility] Pre-operative Risk Assessment     Request for surgical clearance:     Endoscopy Procedure  What type of surgery is being performed?     Colonoscopy   When is this surgery scheduled?     01-25-2023  What type of clearance is required ?   Pharmacy  Are there any medications that need to be held prior to surgery and how long? Yes, Eliquis 2 days  Practice name and name of physician performing surgery?      [facility]  What is your office phone and fax number?      Phone- [phone]  [phone]  Anesthesia type (None, local, MAC, general) ?       MAC

## 2022-11-30 NOTE — Progress Notes (Signed)
11/30/2022 Mario [name] [id number] [birth year]  Referring provider: [name], MD Primary GI doctor:Dr. Lorenso Courier (Dr. Ardis Hughs)  ASSESSMENT AND PLAN:   Iron deficiency anemia due to chronic blood loss No upper GI symptoms with negative FOBT, due for colonoscopy.  We have discussed the risks of bleeding, infection, perforation, medication reactions, and remote risk of death associated with colonoscopy. All questions were answered and the patient acknowledges these risk and wishes to proceed.  Change in bowel habits Loose stools x several months Get on fiber, add on allign, wills schedule for colonoscopy  History of adenomatous polyp of colon 11/06/2018 colonoscopy with Dr. Ardis Hughs for personal history of subcentimeter adenomatous polyps showed 4 sessile polyps sigmoid ascending and cecum 2 to 5 mm in size, multiple largemouth diverticula left side colon, internal hemorrhoids , all were tubular adenoma recall 3 years  Afib on chronic anticoagulation Patient told to hold his Eliquis for 2 days prior to time of procedure. Will instruct when and how to resume after procedure. We will communicate with his prescribing physician Dr. Gwenlyn Found to ensure that holding his Eliquis is acceptable. We discussed the risk, benefits and alternatives to colonoscopy/endoscopy.  We also discussed the low but real risk of cardiovascular event such as heart attack, stroke, embolism, thrombosis or ischemia/infarct of other organs off Eliquis and explained the need to seek urgent help if this occurs.  He is agreeable and wishes to proceed.   History of Present Illness:  76 y.o. male  with a past medical history of paroxysmal atrial fibrillation on Eliquis, COPD with bronchiectasis,  fatty liver, and others listed below, returns to clinic today for evaluation of recall colonoscopy and IDA.  11/06/2018 colonoscopy with Dr. Ardis Hughs for personal history of subcentimeter adenomatous polyps showed 4 sessile  polyps sigmoid ascending and cecum 2 to 5 mm in size, multiple largemouth diverticula left side colon, internal hemorrhoids , all were tubular adenoma recall 3 years  05/15/2019 low risk myocardial perfusion study Patient previously was on CPAP, lost her 50 pounds with weight watchers, no longer on CPAP. He has gained back 40 lbs and wants to go back on the program.   07/28/2022 WBC 12.2, hemoglobin 12.8, MCV 79.4, platelets 350, iron 43, saturation 10% Started on iron, has been off since this past week. 08/31/2022 fit test negative  He has had one and off soft/loose stools for several months, has BM just once or twice a day.  No hematochezia.  No AB pain.  Patient has history of reflux on Protonix twice daily.  Denies any GERD ever, was put on it for his "asthma" treatment, no dysphagia, no melena.  No chest pain, has baseline SOB.   States only new medication is iron. Has ABX up to 4 x a year with his asthma, on antibiotic now for UTI.  No ETOH, NSAIDS as needed 3 x a week,  remote smoking history.   He  reports that he quit smoking about 47 years ago. His smoking use included cigarettes. He has a 22.00 pack-year smoking history. He has never used smokeless tobacco. He reports current alcohol use of about 1.0 standard drink of alcohol per week. He reports that he does not use drugs. His family history includes Arthritis in his sister; Atrial fibrillation in his father; Colon polyps in his father; Rheumatic fever in his mother; Stroke in his father.   Current Medications:    Current Outpatient Medications (Cardiovascular):    hydrochlorothiazide (HYDRODIURIL) 25 MG tablet, Take 1  tablet (25 mg total) by mouth daily.   losartan (COZAAR) 100 MG tablet, Take 1 tablet (100 mg total) by mouth daily.   metoprolol succinate (TOPROL-XL) 25 MG 24 hr tablet, TAKE 1 TABLET (25 MG TOTAL) BY MOUTH DAILY.   rosuvastatin (CRESTOR) 20 MG tablet, TAKE 1 TABLET BY MOUTH EVERY DAY **D/C  LIPITOR**  Current Outpatient Medications (Respiratory):    albuterol (PROAIR HFA) 108 (90 Base) MCG/ACT inhaler, Inhale 2 puffs into the lungs every 4 (four) hours as needed for wheezing or shortness of breath.   budesonide-formoterol (SYMBICORT) 160-4.5 MCG/ACT inhaler, TAKE 2 PUFFS BY MOUTH TWICE A DAY   montelukast (SINGULAIR) 10 MG tablet, TAKE 1 TABLET BY MOUTH EVERY DAY   Current Outpatient Medications (Hematological):    ELIQUIS 5 MG TABS tablet, TAKE 1 TABLET BY MOUTH TWICE A DAY   ferrous gluconate (FERGON) 324 MG tablet, TAKE 1 TABLET BY MOUTH DAILY WITH BREAKFAST  Current Outpatient Medications (Other):    Ascorbic Acid (VITAMIN C) 1000 MG tablet, Take 1,000 mg by mouth 2 (two) times daily.   Cholecalciferol (VITAMIN D3) 125 MCG (5000 UT) TABS, Take by mouth.   Coenzyme Q10 (CO Q-10) 100 MG CAPS, Take 1 capsule by mouth daily.   FIBER COMPLETE PO, Take 1 tablet by mouth daily.   glucosamine-chondroitin 500-400 MG tablet, Take 1 tablet by mouth 3 (three) times daily.   pantoprazole (PROTONIX) 40 MG tablet, Take 1 tablet (40 mg total) by mouth 2 (two) times daily.   Respiratory Therapy Supplies (FLUTTER) DEVI, Use as directed  Surgical History:  He  has a past surgical history that includes Lithotripsy (2000); Colonoscopy (09/29/2014); and Laparoscopic cholecystectomy single site with intraoperative cholangiogram (N/A, 09/15/2019).  Current Medications, Allergies, Past Medical History, Past Surgical History, Family History and Social History were reviewed in [employer] record.  Physical Exam: BP 110/60   Pulse (!) 57   Ht '5\' 9"'$  (1.753 m)   Wt 245 lb (111.1 kg)   BMI 36.18 kg/m  General:   Pleasant, well developed male in no acute distress Heart : Regular rate and rhythm; no murmurs Pulm: Clear anteriorly; no wheezing Abdomen:  Soft, Obese AB, Active bowel sounds. No tenderness . Without guarding and Without rebound, No organomegaly  appreciated. Rectal: Not evaluated Extremities:  without  edema. Neurologic:  Alert and  oriented x4;  No focal deficits.  Psych:  Cooperative. Normal mood and affect.   Vladimir Crofts, PA-C 11/30/22

## 2022-11-30 NOTE — Patient Instructions (Addendum)
_______________________________________________________  If you are age 77 or older, your body mass index should be between 23-30. Your Body mass index is 36.18 kg/m. If this is out of the aforementioned range listed, please consider follow up with your Primary Care Provider.  If you are age 2 or younger, your body mass index should be between 19-25. Your Body mass index is 36.18 kg/m. If this is out of the aformentioned range listed, please consider follow up with your Primary Care Provider.   ________________________________________________________  The [facility] providers would like to encourage you to use [facility] to communicate with providers for non-urgent requests or questions.  Due to long hold times on the telephone, sending your provider a message by [facility] may be a faster and more efficient way to get a response.  Please allow 48 business hours for a response.  Please remember that this is for non-urgent requests.  _______________________________________________________   Get on align daily with antibiotics   FIBER SUPPLEMENT You can do metamucil or fibercon once or twice a day but if this causes gas/bloating please switch to Benefiber or Citracel.  Fiber is good for constipation/diarrhea/irritable bowel syndrome.  It can also help with weight loss and can help lower your bad cholesterol (LDL).  Please do 1 TBSP in the morning in water, coffee, or tea.  It can take up to a month before you can see a difference with your bowel movements.  It is cheapest from [employer], [employer], [employer].  You have been scheduled for a colonoscopy. Please follow written instructions given to you at your visit today.  Please pick up your prep supplies at the pharmacy within the next 1-3 days. If you use inhalers (even only as needed), please bring them with you on the day of your procedure.  Due to recent changes in healthcare laws, you may see the results of your imaging and laboratory studies on  MyChart before your provider has had a chance to review them.  We understand that in some cases there may be results that are confusing or concerning to you. Not all laboratory results come back in the same time frame and the provider may be waiting for multiple results in order to interpret others.  Please give [place] 48 hours in order for your provider to thoroughly review all the results before contacting the office for clarification of your results.   It was a pleasure to see you today!  Thank you for trusting me with your gastrointestinal care!

## 2022-11-30 NOTE — Progress Notes (Signed)
I agree with the assessment and plan as outlined by Ms. Silverio Lay. Okay to proceed with EGD and colonoscopy.

## 2022-12-01 NOTE — Telephone Encounter (Signed)
Primary Cardiologist:Jonathan Gwenlyn Found, MD  Chart reviewed as part of pre-operative protocol coverage. Because of Devlin Brink Vajda's past medical history and time since last visit, he/she will require a follow-up visit in order to better assess preoperative cardiovascular risk.  Pre-op covering staff: - Patient will need to keep his upcoming office appointment for clearance - currently scheduled for 01/17/23 with Dr. Gwenlyn Found. Appointment notes have been updated.  - Please contact requesting surgeon's office via preferred method (i.e, phone, fax) to inform them of need for appointment prior to surgery.  Guidance regarding [place] is attached.    Emmaline Life, NP-C  12/01/2022, 12:24 PM [address], [address] Office [phone] Fax [phone]

## 2022-12-01 NOTE — Telephone Encounter (Signed)
Patient with diagnosis of afib on Eliquis for anticoagulation.    Procedure: colonoscopy Date of procedure: 01/25/23  CHA2DS2-VASc Score = 4  This indicates a 4.8% annual risk of stroke. The patient's score is based upon: CHF History: 0 HTN History: 1 Diabetes History: 0 Stroke History: 0 Vascular Disease History: 1 Age Score: 2 Gender Score: 0   CrCl 50m/min using adjusted body weight Platelet count 350K  Per office protocol, patient can hold Eliquis for 2 days prior to procedure as requested.    **This guidance is not considered finalized until pre-operative APP has relayed final recommendations.**

## 2022-12-01 NOTE — Telephone Encounter (Signed)
Requesting office made aware that clearance will be addressed at upcoming appointment in January

## 2022-12-01 NOTE — Telephone Encounter (Signed)
Office called to advise they will send clearance once he is seen in the office 01/17/23.

## 2022-12-02 ENCOUNTER — Telehealth: Payer: Self-pay

## 2022-12-02 NOTE — Telephone Encounter (Signed)
 Vladimir Crofts, PA-C  , Salvadore Dom, RN  Can we add on EGD for IDA with Dr. Lorenso Courier, same day starting at 3? Thanks!   Previous Messages    ----- Message ----- From: Sharyn Creamer, MD Sent: 11/30/2022   3:53 PM EST To: Vladimir Crofts, PA-C  Kandis Nab, I would go ahead and add on an EGD. If he wants to keep the same day and time, it would be okay for them to start me at 7:30 AM that day so that we can book the double. ----- Message ----- From: Vladimir Crofts, PA-C Sent: 11/30/2022   3:20 PM EST To: Sharyn Creamer, MD  Let me know if you want to add on EGD, I debated, he was negative FOBT with the iron def and no symptoms so did colon.

## 2022-12-02 NOTE — Telephone Encounter (Signed)
Spoke with patient regarding PA & MD recommendations. He would like to proceed with both EGD & colon. Schedule as been adjusted to add on double & the 8:00 am patient has been called as well and requested she arrive by 1:61 am. Pre-cert team notified of changes as well for amb referral purposes.

## 2022-12-06 ENCOUNTER — Encounter: Payer: Self-pay | Admitting: Family Medicine

## 2022-12-06 ENCOUNTER — Ambulatory Visit (INDEPENDENT_AMBULATORY_CARE_PROVIDER_SITE_OTHER): Payer: No Typology Code available for payment source | Admitting: Family Medicine

## 2022-12-06 VITALS — BP 122/82 | HR 57 | Temp 98.6°F | Ht 69.0 in | Wt 247.0 lb

## 2022-12-06 DIAGNOSIS — J471 Bronchiectasis with (acute) exacerbation: Secondary | ICD-10-CM

## 2022-12-06 DIAGNOSIS — D509 Iron deficiency anemia, unspecified: Secondary | ICD-10-CM

## 2022-12-06 MED ORDER — PREDNISONE 20 MG PO TABS: ORAL_TABLET | ORAL | 0 refills | Status: DC

## 2022-12-06 MED ORDER — FLUTICASONE-SALMETEROL 250-50 MCG/ACT IN AEPB: 1.0000 | INHALATION_SPRAY | Freq: Two times a day (BID) | RESPIRATORY_TRACT | 11 refills | Status: DC

## 2022-12-06 MED ORDER — LEVOFLOXACIN 500 MG PO TABS: 500.0000 mg | ORAL_TABLET | Freq: Every day | ORAL | 0 refills | Status: AC

## 2022-12-06 NOTE — Progress Notes (Signed)
Subjective:    Patient ID: Mario [name], male    DOB: 1946-02-11, 76 y.o.   MRN: [id number] Patient is a 76 year old Caucasian gentleman with a history of bronchiectasis and COPD.    Patient is a very pleasant 76 year old Caucasian gentleman with a history of bronchiectasis as well as obstructive pulmonary disease.  He is currently on Symbicort.  He has a few concerns today.  First his wife recently had an upper respiratory infection.  About 3 days ago he started at the same symptoms.  This morning he was wheezing and having a productive cough of yellow sputum.  Given his past history he wanted to be evaluated.  However by this afternoon his lungs sound much better.  I do not appreciate any rales or rhonchi.  There is no expiratory wheezing.  He has good air exchange.  However he states that earlier this morning it was much worse and I certainly trust him as he knows his body.  Second he has a history of iron deficiency anemia and he would like to recheck his iron levels and his hemoglobin as he has been on iron for 3 months.  Third his insurance is no longer going to cover Symbicort so he would like to switch to the generic Advair. Past Medical History:  Diagnosis Date   Asthma    Biliary colic 9449   COPD (chronic obstructive pulmonary disease) with chronic bronchitis    Coronary artery calcification seen on CAT scan    Cyst of pancreas    1 cm (7/18), repeat ct in 12 months.     Hyperlipidemia    Hypertension    Obstructive sleep apnea    Renal disorder    Rhinitis    Sleep apnea    pt denies sleep apnea-lost weight   Past Surgical History:  Procedure Laterality Date   COLONOSCOPY  09/29/2014   LAPAROSCOPIC CHOLECYSTECTOMY SINGLE SITE WITH INTRAOPERATIVE CHOLANGIOGRAM N/A 09/15/2019   Procedure: LAPAROSCOPIC CHOLECYSTECTOMY SINGLE SITE;  Surgeon: Jaxsun Boston, MD;  Location: [facility];  Service: General;  Laterality: N/A;   LITHOTRIPSY  2000   for kidney stone   Current Outpatient  Medications on File Prior to Visit  Medication Sig Dispense Refill   albuterol (PROAIR HFA) 108 (90 Base) MCG/ACT inhaler Inhale 2 puffs into the lungs every 4 (four) hours as needed for wheezing or shortness of breath. 8.5 each 2   Ascorbic Acid (VITAMIN C) 1000 MG tablet Take 1,000 mg by mouth 2 (two) times daily.     budesonide-formoterol (SYMBICORT) 160-4.5 MCG/ACT inhaler TAKE 2 PUFFS BY MOUTH TWICE A DAY 10.2 each 6   Cholecalciferol (VITAMIN D3) 125 MCG (5000 UT) TABS Take by mouth.     Coenzyme Q10 (CO Q-10) 100 MG CAPS Take 1 capsule by mouth daily.     ELIQUIS 5 MG TABS tablet TAKE 1 TABLET BY MOUTH TWICE A DAY 60 tablet 5   ferrous gluconate (FERGON) 324 MG tablet TAKE 1 TABLET BY MOUTH DAILY WITH BREAKFAST 30 tablet 3   FIBER COMPLETE PO Take 1 tablet by mouth daily.     glucosamine-chondroitin 500-400 MG tablet Take 1 tablet by mouth 3 (three) times daily.     hydrochlorothiazide (HYDRODIURIL) 25 MG tablet Take 1 tablet (25 mg total) by mouth daily. 90 tablet 0   losartan (COZAAR) 100 MG tablet Take 1 tablet (100 mg total) by mouth daily. 90 tablet 3   metoprolol succinate (TOPROL-XL) 25 MG 24 hr tablet TAKE 1  TABLET (25 MG TOTAL) BY MOUTH DAILY. 90 tablet 0   montelukast (SINGULAIR) 10 MG tablet TAKE 1 TABLET BY MOUTH EVERY DAY 90 tablet 3   pantoprazole (PROTONIX) 40 MG tablet Take 1 tablet (40 mg total) by mouth 2 (two) times daily. 180 tablet 0   Respiratory Therapy Supplies (FLUTTER) DEVI Use as directed 1 each 0   rosuvastatin (CRESTOR) 20 MG tablet TAKE 1 TABLET BY MOUTH EVERY DAY **D/C LIPITOR** 90 tablet 3   No current facility-administered medications on file prior to visit.   No Known Allergies Social History   Socioeconomic History   Marital status: Married    Spouse name: Not on file   Number of children: Not on file   Years of education: Not on file   Highest education level: Not on file  Occupational History   Not on file  Tobacco Use   Smoking status:  Former    Packs/day: 2.00    Years: 11.00    Total pack years: 22.00    Types: Cigarettes    Quit date: 12/19/1974    Years since quitting: 47.9   Smokeless tobacco: Never  Vaping Use   Vaping Use: Never used  Substance and Sexual Activity   Alcohol use: Yes    Alcohol/week: 1.0 standard drink of alcohol    Types: 1 Glasses of wine per week   Drug use: No   Sexual activity: Not on file  Other Topics Concern   Not on file  Social History Narrative   Not on file   Social Determinants of Health   Financial Resource Strain: Medium Risk (11/23/2022)   Overall Financial Resource Strain (CARDIA)    Difficulty of Paying Living Expenses: Somewhat hard  Food Insecurity: No Food Insecurity (11/23/2022)   Hunger Vital Sign    Worried About Running Out of Food in the Last Year: Never true    Ran Out of Food in the Last Year: Never true  Transportation Needs: No Transportation Needs (11/23/2022)   PRAPARE - Hydrologist (Medical): No    Lack of Transportation (Non-Medical): No  Physical Activity: Insufficiently Active (11/23/2022)   Exercise Vital Sign    Days of Exercise per Week: 2 days    Minutes of Exercise per Session: 20 min  Stress: No Stress Concern Present (11/23/2022)   [place]    Feeling of Stress : Only a little  Social Connections: Socially Integrated (11/23/2022)   Social Connection and Isolation Panel [NHANES]    Frequency of Communication with Friends and Family: More than three times a week    Frequency of Social Gatherings with Friends and Family: More than three times a week    Attends Religious Services: More than 4 times per year    Active Member of [employer] or Organizations: Yes    Attends Music therapist: More than 4 times per year    Marital Status: Married  Human resources officer Violence: Not At Risk (11/23/2022)   Humiliation, Afraid, Rape, and Kick questionnaire     Fear of Current or Ex-Partner: No    Emotionally Abused: No    Physically Abused: No    Sexually Abused: No      Review of Systems  Respiratory:  Positive for cough.   All other systems reviewed and are negative.      Objective:   Physical Exam Vitals reviewed.  Constitutional:      Appearance: He  is well-developed.  HENT:     Head: Normocephalic and atraumatic.     Nose: Nose normal.     Mouth/Throat:     Pharynx: No oropharyngeal exudate.  Neck:     Vascular: No carotid bruit.  Cardiovascular:     Rate and Rhythm: Normal rate and regular rhythm.     Heart sounds: Normal heart sounds.  Pulmonary:     Effort: Pulmonary effort is normal. No respiratory distress.     Breath sounds: No stridor or decreased air movement. No wheezing, rhonchi or rales.  Musculoskeletal:     Right lower leg: No edema.     Left lower leg: No edema.           Assessment & Plan:  Iron deficiency anemia, unspecified iron deficiency anemia type - Plan: CBC with Differential/Platelet, COMPLETE METABOLIC PANEL WITH GFR, Iron, Ferritin  Bronchiectasis with (acute) exacerbation ([place]) The patient's exam today is reassuring.  I gave him a prescription for prednisone and Levaquin.  I trust the patient's opinion.  If he starts wheezing more and having to use his rescue inhaler, I want him to start the prednisone.  If his cough continues to have purulent sputum or he develops a fever I want him to start the Levaquin for a possible exacerbation of his bronchiectasis.  At the present time he does not require the medication.  Of asked him only to continue Mucinex and his flutter valve but the patient can deteriorate quickly so he will monitor this.  We will check a CBC and iron level.  We will discontinue Symbicort and switch the patient to the generic Advair 250/50 1 inhalation twice daily

## 2022-12-07 LAB — COMPLETE METABOLIC PANEL WITH GFR
AG Ratio: 1.6 (calc) (ref 1.0–2.5)
ALT: 25 U/L (ref 9–46)
AST: 25 U/L (ref 10–35)
Albumin: 3.9 g/dL (ref 3.6–5.1)
Alkaline phosphatase (APISO): 42 U/L (ref 35–144)
BUN: 19 mg/dL (ref 7–25)
CO2: 27 mmol/L (ref 20–32)
Calcium: 9.3 mg/dL (ref 8.6–10.3)
Chloride: 105 mmol/L (ref 98–110)
Creat: 1.19 mg/dL (ref 0.70–1.28)
Globulin: 2.5 g/dL (calc) (ref 1.9–3.7)
Glucose, Bld: 107 mg/dL — ABNORMAL HIGH (ref 65–99)
Potassium: 3.9 mmol/L (ref 3.5–5.3)
Sodium: 139 mmol/L (ref 135–146)
Total Bilirubin: 0.7 mg/dL (ref 0.2–1.2)
Total Protein: 6.4 g/dL (ref 6.1–8.1)
eGFR: 63 mL/min/{1.73_m2} (ref >=60)

## 2022-12-07 LAB — CBC WITH DIFFERENTIAL/PLATELET
Absolute Monocytes: 744 cells/uL (ref 200–950)
Basophils Absolute: 47 cells/uL (ref 0–200)
Basophils Relative: 0.7 %
Eosinophils Absolute: 80 cells/uL (ref 15–500)
Eosinophils Relative: 1.2 %
HCT: 43.1 % (ref 38.5–50.0)
Hemoglobin: 14.9 g/dL (ref 13.2–17.1)
Lymphs Abs: 958 cells/uL (ref 850–3900)
MCH: 32 pg (ref 27.0–33.0)
MCHC: 34.6 g/dL (ref 32.0–36.0)
MCV: 92.7 fL (ref 80.0–100.0)
MPV: 10.6 fL (ref 7.5–12.5)
Monocytes Relative: 11.1 %
Neutro Abs: 4871 cells/uL (ref 1500–7800)
Neutrophils Relative %: 72.7 %
Platelets: 261 10*3/uL (ref 140–400)
RBC: 4.65 10*6/uL (ref 4.20–5.80)
RDW: 13.3 % (ref 11.0–15.0)
Total Lymphocyte: 14.3 %
WBC: 6.7 10*3/uL (ref 3.8–10.8)

## 2022-12-07 LAB — FERRITIN: Ferritin: 29 ng/mL (ref 24–380)

## 2022-12-07 LAB — IRON: Iron: 69 ug/dL (ref 50–180)

## 2022-12-14 ENCOUNTER — Telehealth: Payer: Self-pay | Admitting: Family Medicine

## 2022-12-14 NOTE — Telephone Encounter (Signed)
Patient called to follow up on sx and recent visit; stated he's still ill and is requesting a refill of     predniSONE (DELTASONE) 20 MG tablet [[id number]]   Pharmacy confirmed as  [id number] [place] [id number]: [phone] Fax: [phone] #: [id number]   Please advise at [phone]

## 2022-12-15 ENCOUNTER — Other Ambulatory Visit: Payer: Self-pay | Admitting: Family Medicine

## 2022-12-15 MED ORDER — PREDNISONE 20 MG PO TABS: ORAL_TABLET | ORAL | 0 refills | Status: DC

## 2022-12-20 ENCOUNTER — Ambulatory Visit: Payer: No Typology Code available for payment source | Admitting: Family Medicine

## 2022-12-23 ENCOUNTER — Encounter: Payer: Self-pay | Admitting: Family Medicine

## 2022-12-23 ENCOUNTER — Ambulatory Visit: Payer: No Typology Code available for payment source | Admitting: Family Medicine

## 2022-12-23 VITALS — BP 136/72 | HR 57 | Temp 98.8°F | Ht 69.0 in | Wt 244.0 lb

## 2022-12-23 DIAGNOSIS — J471 Bronchiectasis with (acute) exacerbation: Secondary | ICD-10-CM

## 2022-12-23 MED ORDER — AMOXICILLIN-POT CLAVULANATE 875-125 MG PO TABS: 1.0000 | ORAL_TABLET | Freq: Two times a day (BID) | ORAL | 0 refills | Status: DC

## 2022-12-23 MED ORDER — AZITHROMYCIN 250 MG PO TABS: ORAL_TABLET | ORAL | 0 refills | Status: DC

## 2022-12-23 NOTE — Progress Notes (Signed)
Subjective:    Patient ID: Mario [name], male    DOB: 15-Mar-1946, 77 y.o.   MRN: [id number] 12/06/22 Patient is a 77 year old Caucasian gentleman with a history of bronchiectasis and COPD.    Patient is a very pleasant 77 year old Caucasian gentleman with a history of bronchiectasis as well as obstructive pulmonary disease.  He is currently on Symbicort.  He has a few concerns today.  First his wife recently had an upper respiratory infection.  About 3 days ago he started at the same symptoms.  This morning he was wheezing and having a productive cough of yellow sputum.  Given his past history he wanted to be evaluated.  However by this afternoon his lungs sound much better.  I do not appreciate any rales or rhonchi.  There is no expiratory wheezing.  He has good air exchange.  However he states that earlier this morning it was much worse and I certainly trust him as he knows his body.  Second he has a history of iron deficiency anemia and he would like to recheck his iron levels and his hemoglobin as he has been on iron for 3 months.  Third his insurance is no longer going to cover Symbicort so he would like to switch to the generic Advair.  At that  time, my plan was:  The patient's exam today is reassuring.  I gave him a prescription for prednisone and Levaquin.  I trust the patient's opinion.  If he starts wheezing more and having to use his rescue inhaler, I want him to start the prednisone.  If his cough continues to have purulent sputum or he develops a fever I want him to start the Levaquin for a possible exacerbation of his bronchiectasis.  At the present time he does not require the medication.  Of asked him only to continue Mucinex and his flutter valve but the patient can deteriorate quickly so he will monitor this.  We will check a CBC and iron level.  We will discontinue Symbicort and switch the patient to the generic Advair 250/50 1 inhalation twice daily  12/23/22 Patient ultimately  started the Levaquin and the prednisone as his symptoms deteriorated after his last visit.  He then called back after Christmas requiring additional prednisone due to wheezing.  The wheezing has improved however he states that he is feeling worse.  He states that his situation has deteriorated since I last saw him.  He is reporting right-sided pleurisy, shortness of breath, and persistent cough.  On examination today there is no expiratory wheezing.  However he does have right-sided Rales and crackles.  This is the area where he is feeling pain and burning with inspiration Past Medical History:  Diagnosis Date   Asthma    Biliary colic 3295   COPD (chronic obstructive pulmonary disease) with chronic bronchitis    Coronary artery calcification seen on CAT scan    Cyst of pancreas    1 cm (7/18), repeat ct in 12 months.     Hyperlipidemia    Hypertension    Obstructive sleep apnea    Renal disorder    Rhinitis    Sleep apnea    pt denies sleep apnea-lost weight   Past Surgical History:  Procedure Laterality Date   COLONOSCOPY  09/29/2014   LAPAROSCOPIC CHOLECYSTECTOMY SINGLE SITE WITH INTRAOPERATIVE CHOLANGIOGRAM N/A 09/15/2019   Procedure: LAPAROSCOPIC CHOLECYSTECTOMY SINGLE SITE;  Surgeon: Ostin Boston, MD;  Location: [facility];  Service: General;  Laterality: N/A;  LITHOTRIPSY  2000   for kidney stone   Current Outpatient Medications on File Prior to Visit  Medication Sig Dispense Refill   albuterol (PROAIR HFA) 108 (90 Base) MCG/ACT inhaler Inhale 2 puffs into the lungs every 4 (four) hours as needed for wheezing or shortness of breath. 8.5 each 2   Ascorbic Acid (VITAMIN C) 1000 MG tablet Take 1,000 mg by mouth 2 (two) times daily.     budesonide-formoterol (SYMBICORT) 160-4.5 MCG/ACT inhaler TAKE 2 PUFFS BY MOUTH TWICE A DAY 10.2 each 6   Cholecalciferol (VITAMIN D3) 125 MCG (5000 UT) TABS Take by mouth.     Coenzyme Q10 (CO Q-10) 100 MG CAPS Take 1 capsule by mouth daily.      ELIQUIS 5 MG TABS tablet TAKE 1 TABLET BY MOUTH TWICE A DAY 60 tablet 5   FIBER COMPLETE PO Take 1 tablet by mouth daily.     fluticasone-salmeterol (ADVAIR) 250-50 MCG/ACT AEPB Inhale 1 puff into the lungs in the morning and at bedtime. 1 each 11   glucosamine-chondroitin 500-400 MG tablet Take 1 tablet by mouth 3 (three) times daily.     hydrochlorothiazide (HYDRODIURIL) 25 MG tablet Take 1 tablet (25 mg total) by mouth daily. 90 tablet 0   losartan (COZAAR) 100 MG tablet Take 1 tablet (100 mg total) by mouth daily. 90 tablet 3   metoprolol succinate (TOPROL-XL) 25 MG 24 hr tablet TAKE 1 TABLET (25 MG TOTAL) BY MOUTH DAILY. 90 tablet 0   montelukast (SINGULAIR) 10 MG tablet TAKE 1 TABLET BY MOUTH EVERY DAY 90 tablet 3   pantoprazole (PROTONIX) 40 MG tablet Take 1 tablet (40 mg total) by mouth 2 (two) times daily. 180 tablet 0   predniSONE (DELTASONE) 20 MG tablet 3 tabs poqday 1-2, 2 tabs poqday 3-4, 1 tab poqday 5-6 12 tablet 0   Respiratory Therapy Supplies (FLUTTER) DEVI Use as directed 1 each 0   rosuvastatin (CRESTOR) 20 MG tablet TAKE 1 TABLET BY MOUTH EVERY DAY **D/C LIPITOR** 90 tablet 3   No current facility-administered medications on file prior to visit.   No Known Allergies Social History   Socioeconomic History   Marital status: Married    Spouse name: Not on file   Number of children: Not on file   Years of education: Not on file   Highest education level: Not on file  Occupational History   Not on file  Tobacco Use   Smoking status: Former    Packs/day: 2.00    Years: 11.00    Total pack years: 22.00    Types: Cigarettes    Quit date: 12/19/1974    Years since quitting: 48.0   Smokeless tobacco: Never  Vaping Use   Vaping Use: Never used  Substance and Sexual Activity   Alcohol use: Yes    Alcohol/week: 1.0 standard drink of alcohol    Types: 1 Glasses of wine per week   Drug use: No   Sexual activity: Not on file  Other Topics Concern   Not on file  Social  History Narrative   Not on file   Social Determinants of Health   Financial Resource Strain: Medium Risk (11/23/2022)   Overall Financial Resource Strain (CARDIA)    Difficulty of Paying Living Expenses: Somewhat hard  Food Insecurity: No Food Insecurity (11/23/2022)   Hunger Vital Sign    Worried About Running Out of Food in the Last Year: Never true    Ran Out of Food in the Last  Year: Never true  Transportation Needs: No Transportation Needs (11/23/2022)   PRAPARE - Hydrologist (Medical): No    Lack of Transportation (Non-Medical): No  Physical Activity: Insufficiently Active (11/23/2022)   Exercise Vital Sign    Days of Exercise per Week: 2 days    Minutes of Exercise per Session: 20 min  Stress: No Stress Concern Present (11/23/2022)   [place]    Feeling of Stress : Only a little  Social Connections: Socially Integrated (11/23/2022)   Social Connection and Isolation Panel [NHANES]    Frequency of Communication with Friends and Family: More than three times a week    Frequency of Social Gatherings with Friends and Family: More than three times a week    Attends Religious Services: More than 4 times per year    Active Member of [employer] or Organizations: Yes    Attends Music therapist: More than 4 times per year    Marital Status: Married  Human resources officer Violence: Not At Risk (11/23/2022)   Humiliation, Afraid, Rape, and Kick questionnaire    Fear of Current or Ex-Partner: No    Emotionally Abused: No    Physically Abused: No    Sexually Abused: No      Review of Systems  Respiratory:  Positive for cough.   All other systems reviewed and are negative.      Objective:   Physical Exam Vitals reviewed.  Constitutional:      Appearance: He is well-developed.  HENT:     Head: Normocephalic and atraumatic.     Nose: Nose normal.     Mouth/Throat:     Pharynx: No  oropharyngeal exudate.  Neck:     Vascular: No carotid bruit.  Cardiovascular:     Rate and Rhythm: Normal rate and regular rhythm.     Heart sounds: Normal heart sounds.  Pulmonary:     Effort: Pulmonary effort is normal. No respiratory distress.     Breath sounds: No stridor or decreased air movement. Rhonchi and rales present. No wheezing.    Musculoskeletal:     Right lower leg: No edema.     Left lower leg: No edema.           Assessment & Plan:  Bronchiectasis with (acute) exacerbation ([place]) I am concerned the patient may be developing community-acquired pneumonia.  Begin Augmentin 875 mg twice daily.  Added Z-Pak to cover atypicals.  Encouraged the patient to use his flutter valve as well as Mucinex and encourage good pulmonary toilet to help facilitate clearance of the mucus from his airways.  Recommended incentive spirometry.  Seek medical attention immediately if worsening over the weekend

## 2022-12-31 ENCOUNTER — Emergency Department (HOSPITAL_COMMUNITY): Payer: No Typology Code available for payment source

## 2022-12-31 ENCOUNTER — Other Ambulatory Visit: Payer: Self-pay

## 2022-12-31 ENCOUNTER — Emergency Department (HOSPITAL_COMMUNITY)
Admission: EM | Admit: 2022-12-31 | Discharge: 2022-12-31 | Disposition: A | Discharge status: Home / Self Care | Payer: No Typology Code available for payment source | Attending: Student | Admitting: Student

## 2022-12-31 ENCOUNTER — Encounter (HOSPITAL_COMMUNITY): Payer: Self-pay

## 2022-12-31 DIAGNOSIS — S43014A Anterior dislocation of right humerus, initial encounter: Secondary | ICD-10-CM | POA: Diagnosis not present

## 2022-12-31 DIAGNOSIS — W108XXA Fall (on) (from) other stairs and steps, initial encounter: Secondary | ICD-10-CM | POA: Insufficient documentation

## 2022-12-31 DIAGNOSIS — Z7901 Long term (current) use of anticoagulants: Secondary | ICD-10-CM | POA: Insufficient documentation

## 2022-12-31 DIAGNOSIS — W19XXXA Unspecified fall, initial encounter: Secondary | ICD-10-CM | POA: Diagnosis not present

## 2022-12-31 DIAGNOSIS — J45909 Unspecified asthma, uncomplicated: Secondary | ICD-10-CM | POA: Diagnosis not present

## 2022-12-31 DIAGNOSIS — Z79899 Other long term (current) drug therapy: Secondary | ICD-10-CM | POA: Insufficient documentation

## 2022-12-31 DIAGNOSIS — Z743 Need for continuous supervision: Secondary | ICD-10-CM | POA: Diagnosis not present

## 2022-12-31 DIAGNOSIS — Z7951 Long term (current) use of inhaled steroids: Secondary | ICD-10-CM | POA: Insufficient documentation

## 2022-12-31 DIAGNOSIS — Y9389 Activity, other specified: Secondary | ICD-10-CM | POA: Diagnosis not present

## 2022-12-31 DIAGNOSIS — I10 Essential (primary) hypertension: Secondary | ICD-10-CM | POA: Diagnosis not present

## 2022-12-31 DIAGNOSIS — J449 Chronic obstructive pulmonary disease, unspecified: Secondary | ICD-10-CM | POA: Diagnosis not present

## 2022-12-31 DIAGNOSIS — S4981XA Other specified injuries of right shoulder and upper arm, initial encounter: Secondary | ICD-10-CM | POA: Diagnosis not present

## 2022-12-31 DIAGNOSIS — S43004A Unspecified dislocation of right shoulder joint, initial encounter: Secondary | ICD-10-CM | POA: Insufficient documentation

## 2022-12-31 DIAGNOSIS — S4991XA Unspecified injury of right shoulder and upper arm, initial encounter: Secondary | ICD-10-CM | POA: Diagnosis present

## 2022-12-31 MED ORDER — PROPOFOL 10 MG/ML IV BOLUS: 0.5000 mg/kg | Freq: Once | INTRAVENOUS | Status: DC

## 2022-12-31 MED ORDER — PHENYLEPHRINE 80 MCG/ML (10ML) SYRINGE FOR IV PUSH (FOR BLOOD PRESSURE SUPPORT): 80.0000 ug | PREFILLED_SYRINGE | Freq: Once | INTRAVENOUS | Status: DC | PRN

## 2022-12-31 MED ORDER — FENTANYL CITRATE PF 50 MCG/ML IJ SOSY
50.0000 ug | PREFILLED_SYRINGE | Freq: Once | INTRAMUSCULAR | Status: AC
  Administered 2022-12-31: 50 ug via INTRAMUSCULAR
  Filled 2022-12-31: qty 1

## 2022-12-31 MED ORDER — KETAMINE HCL 50 MG/5ML IJ SOSY
1.0000 mg/kg | PREFILLED_SYRINGE | Freq: Once | INTRAMUSCULAR | Status: AC
  Filled 2022-12-31: qty 15

## 2022-12-31 MED ORDER — ACETAMINOPHEN 500 MG PO TABS: 1000.0000 mg | ORAL_TABLET | Freq: Three times a day (TID) | ORAL | 0 refills | Status: AC

## 2022-12-31 MED ORDER — KETAMINE HCL 50 MG/5ML IJ SOSY
PREFILLED_SYRINGE | INTRAMUSCULAR | Status: AC
  Administered 2022-12-31: 70 mg via INTRAVENOUS
  Filled 2022-12-31: qty 5

## 2022-12-31 MED ORDER — OXYCODONE HCL 5 MG PO TABS: 5.0000 mg | ORAL_TABLET | Freq: Four times a day (QID) | ORAL | 0 refills | Status: DC | PRN

## 2022-12-31 NOTE — ED Provider Notes (Signed)
[facility] EMERGENCY DEPARTMENT Provider Note  CSN: [id number] Arrival date & time: 12/31/22 1136  Chief Complaint(s) Shoulder Injury  HPI Mario [name] is a 77 y.o. male with PMH COPD, HTN, HLD who presents emergency department for evaluation of a right shoulder injury.  Patient states that he was taking Christmas decorations down today and fell from 2 steps, and was holding onto a door frame when he fell.  He felt a pop in his right shoulder and presents to the ER with significant shoulder pain.  No previous injury to the shoulder.  Denies numbness, tingling, weakness to the upper extremity.  Pulses intact.   Past Medical History Past Medical History:  Diagnosis Date  . Asthma   . Biliary colic 9242  . COPD (chronic obstructive pulmonary disease) with chronic bronchitis   . Coronary artery calcification seen on CAT scan   . Cyst of pancreas    1 cm (7/18), repeat ct in 12 months.    . Hyperlipidemia   . Hypertension   . Obstructive sleep apnea   . Renal disorder   . Rhinitis   . Sleep apnea    pt denies sleep apnea-lost weight   Patient Active Problem List   Diagnosis Date Noted  . History of COVID-19 12/14/2020  . Healthcare maintenance 12/14/2020  . Medication management 07/13/2020  . Acute calculous cholecystitis 09/13/2019  . Chronic anticoagulation 09/13/2019  . Hiatal hernia 09/13/2019  . Personal history of colonic polyps 09/13/2019  . Diverticulosis of sigmoid colon 09/13/2019  . Fatty liver 09/13/2019  . Gastric reflux 09/13/2019  . Cholecystitis 09/13/2019  . Cyst of pancreas   . Renal disorder   . Coronary artery calcification seen on CAT scan   . Chronic cough 08/21/2018  . Chronic frontal sinusitis 07/31/2018  . 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 Deviated nasal septum 07/31/2018  . Asthma, chronic, moderate persistent vs ACOS in former smoker  07/06/2018  . Bronchiectasis without complication ([place]) 68/34/1962  . Paroxysmal  atrial fibrillation ([place]) 12/29/2017  . COPD (chronic obstructive pulmonary disease) with chronic bronchitis   . Hyperlipidemia   . Biliary colic 2297  . Epigastric abdominal pain 12/12/2012  . Obstructive sleep apnea   . Essential hypertension 12/24/2009  . KNEE PAIN, LEFT 12/24/2009  . MEDIAL MENISCUS TEAR, LEFT 12/24/2009   Home Medication(s) Prior to Admission medications   Medication Sig Start Date End Date Taking? Authorizing Provider  albuterol (PROAIR HFA) 108 (90 Base) MCG/ACT inhaler Inhale 2 puffs into the lungs every 4 (four) hours as needed for wheezing or shortness of breath. 07/28/22   [name], MD  amoxicillin-clavulanate (AUGMENTIN) 875-125 MG tablet Take 1 tablet by mouth 2 (two) times daily. 12/23/22   [name], MD  Ascorbic Acid (VITAMIN C) 1000 MG tablet Take 1,000 mg by mouth 2 (two) times daily. 11/18/20   [provider]  azithromycin (ZITHROMAX) 250 MG tablet 2 tabs poqday1, 1 tab poqday 2-5 12/23/22   [name], MD  budesonide-formoterol (SYMBICORT) 160-4.5 MCG/ACT inhaler TAKE 2 PUFFS BY MOUTH TWICE A DAY 06/14/22   [name], MD  Cholecalciferol (VITAMIN D3) 125 MCG (5000 UT) TABS Take by mouth.    [provider]  Coenzyme Q10 (CO Q-10) 100 MG CAPS Take 1 capsule by mouth daily.    [provider]  ELIQUIS 5 MG TABS tablet TAKE 1 TABLET BY MOUTH TWICE A DAY 10/31/22   [name], MD  FIBER COMPLETE  PO Take 1 tablet by mouth daily.    [provider]  fluticasone-salmeterol (ADVAIR) 250-50 MCG/ACT AEPB Inhale 1 puff into the lungs in the morning and at bedtime. 12/06/22   [name], MD  glucosamine-chondroitin 500-400 MG tablet Take 1 tablet by mouth 3 (three) times daily.    [provider]  hydrochlorothiazide (HYDRODIURIL) 25 MG tablet Take 1 tablet (25 mg total) by mouth daily. 07/28/22   [name], MD  losartan (COZAAR) 100 MG tablet Take 1 tablet (100 mg total) by  mouth daily. 07/28/22   [name], MD  metoprolol succinate (TOPROL-XL) 25 MG 24 hr tablet TAKE 1 TABLET (25 MG TOTAL) BY MOUTH DAILY. 11/28/22   [name], MD  montelukast (SINGULAIR) 10 MG tablet TAKE 1 TABLET BY MOUTH EVERY DAY 10/24/22   [name], MD  pantoprazole (PROTONIX) 40 MG tablet Take 1 tablet (40 mg total) by mouth 2 (two) times daily. 11/01/22   [name], MD  predniSONE (DELTASONE) 20 MG tablet 3 tabs poqday 1-2, 2 tabs poqday 3-4, 1 tab poqday 5-6 12/15/22   [name], MD  Respiratory Therapy Supplies (FLUTTER) DEVI Use as directed 07/05/18   Tanda Rockers, MD  rosuvastatin (CRESTOR) 20 MG tablet TAKE 1 TABLET BY MOUTH EVERY DAY **D/C LIPITOR** 07/28/22   [name], MD                                                                                                                                    Past Surgical History Past Surgical History:  Procedure Laterality Date  . COLONOSCOPY  09/29/2014  . LAPAROSCOPIC CHOLECYSTECTOMY SINGLE SITE WITH INTRAOPERATIVE CHOLANGIOGRAM N/A 09/15/2019   Procedure: LAPAROSCOPIC CHOLECYSTECTOMY SINGLE SITE;  Surgeon: Christin Boston, MD;  Location: [facility];  Service: General;  Laterality: N/A;  . LITHOTRIPSY  2000   for kidney stone   Family History Family History  Problem Relation Age of Onset  . Rheumatic fever Mother   . Stroke Father   . Atrial fibrillation Father   . Colon polyps Father   . Arthritis Sister   . Colon cancer Neg Hx   . Esophageal cancer Neg Hx   . Rectal cancer Neg Hx   . Stomach cancer Neg Hx     Social History Social History   Tobacco Use  . Smoking status: Former    Packs/day: 2.00    Years: 11.00    Total pack years: 22.00    Types: Cigarettes    Quit date: 12/19/1974    Years since quitting: 48.0  . Smokeless tobacco: Never  Vaping Use  . Vaping Use: Never used  Substance Use Topics  . Alcohol use: Not Currently    Alcohol/week: 1.0 standard drink of  alcohol    Types: 1 Glasses of wine per week  . Drug use: No   Allergies Patient has no known  allergies.  Review of Systems Review of Systems  Musculoskeletal:  Positive for arthralgias and joint swelling.    Physical Exam Vital Signs  I have reviewed the triage vital signs BP (!) 145/91 (BP Location: Left Arm)   Pulse (!) 52   Temp 97.7 F (36.5 C) (Oral)   Resp 18   SpO2 98%   Physical Exam Constitutional:      General: He is in acute distress.     Appearance: Normal appearance.  HENT:     Head: Normocephalic and atraumatic.     Nose: No congestion or rhinorrhea.  Eyes:     General:        Right eye: No discharge.        Left eye: No discharge.     Extraocular Movements: Extraocular movements intact.     Pupils: Pupils are equal, round, and reactive to light.  Cardiovascular:     Rate and Rhythm: Normal rate and regular rhythm.     Heart sounds: No murmur heard. Pulmonary:     Effort: No respiratory distress.     Breath sounds: No wheezing or rales.  Abdominal:     General: There is no distension.     Tenderness: There is no abdominal tenderness.  Musculoskeletal:        General: Swelling, tenderness and deformity present. Normal range of motion.     Cervical back: Normal range of motion.  Skin:    General: Skin is warm and dry.  Neurological:     General: No focal deficit present.     Mental Status: He is alert.    ED Results and Treatments Labs (all labs ordered are listed, but only abnormal results are displayed) Labs Reviewed - No data to display                                                                                                                        Radiology DG Shoulder Right  Result Date: 12/31/2022 CLINICAL DATA:  Right hand injury EXAM: RIGHT SHOULDER - 2+ VIEW COMPARISON:  None Available. FINDINGS: Anterior glenohumeral dislocation. No detected fracture. Mild degenerative spurring at the glenohumeral joint. IMPRESSION: Anterior  glenohumeral dislocation. Electronically Signed   By: Jorje Guild M.D.   On: 12/31/2022 12:45    Pertinent labs & imaging results that were available during my care of the patient were reviewed by me and considered in my medical decision making (see MDM for details).  Medications Ordered in ED Medications  PHENYLephrine 80 mcg/ml in normal saline Adult IV Push Syringe (For Blood Pressure Support) (has no administration in time range)  fentaNYL (SUBLIMAZE) injection 50 mcg (50 mcg Intramuscular Given 12/31/22 1308)  ketamine 50 mg in normal saline 5 mL (10 mg/mL) syringe (70 mg Intravenous Given 12/31/22 1336)  Procedures .Ortho Injury Treatment  Date/Time: 12/31/2022 7:17 PM  Performed by: Teressa Lower, MD Authorized by: Teressa Lower, MD   Consent:    Consent obtained:  Written   Consent given by:  Patient   Risks discussed:  Fracture, nerve damage, restricted joint movement, stiffness, irreducible dislocation and recurrent dislocation   Alternatives discussed:  No treatment and immobilizationInjury location: shoulder Injury type: dislocation Dislocation type: anterior Hill-Sachs deformity: no Chronicity: new Pre-procedure neurovascular assessment: neurovascularly intact Pre-procedure distal perfusion: normal Pre-procedure neurological function: normal Pre-procedure range of motion: reduced Manipulation performed: yes Reduction method: external rotation Reduction successful: yes X-ray confirmed reduction: yes Immobilization: sling Post-procedure distal perfusion: normal Post-procedure neurological function: normal Post-procedure range of motion: improved   .Sedation  Date/Time: 12/31/2022 7:18 PM  Performed by: Teressa Lower, MD Authorized by: Teressa Lower, MD   Consent:    Consent obtained:  Verbal   Consent given by:   Patient   Risks discussed:  Allergic reaction, dysrhythmia, inadequate sedation, nausea, prolonged hypoxia resulting in organ damage, prolonged sedation necessitating reversal, respiratory compromise necessitating ventilatory assistance and intubation and vomiting   Alternatives discussed:  Analgesia without sedation, anxiolysis and regional anesthesia Universal protocol:    Procedure explained and questions answered to patient or proxy's satisfaction: yes     Relevant documents present and verified: yes     Test results available: yes     Imaging studies available: yes     Required blood products, implants, devices, and special equipment available: yes     Site/side marked: yes     Immediately prior to procedure, a time out was called: yes     Patient identity confirmed:  Verbally with patient Indications:    Procedure necessitating sedation performed by:  Physician performing sedation Pre-sedation assessment:    Time since last food or drink:  0800   ASA classification: class 1 - normal, healthy patient     Mouth opening:  3 or more finger widths   Thyromental distance:  4 finger widths   Mallampati score:  I - soft palate, uvula, fauces, pillars visible   Neck mobility: normal     Pre-sedation assessments completed and reviewed: airway patency, cardiovascular function, hydration status, mental status, nausea/vomiting, pain level, respiratory function and temperature   Immediate pre-procedure details:    Reassessment: Patient reassessed immediately prior to procedure     Reviewed: vital signs, relevant labs/tests and NPO status     Verified: bag valve mask available, emergency equipment available, intubation equipment available, IV patency confirmed, oxygen available and suction available   Procedure details (see MAR for exact dosages):    Preoxygenation:  Nasal cannula   Sedation:  Ketamine   Intended level of sedation: deep   Intra-procedure monitoring:  Blood pressure monitoring,  cardiac monitor, continuous pulse oximetry, frequent LOC assessments, frequent vital sign checks and continuous capnometry   Intra-procedure events: none     Total Provider sedation time (minutes):  30 Post-procedure details:    Attendance: Constant attendance by certified staff until patient recovered     Recovery: Patient returned to pre-procedure baseline     Post-sedation assessments completed and reviewed: airway patency, cardiovascular function, hydration status, mental status, nausea/vomiting, pain level, respiratory function and temperature     Patient is stable for discharge or admission: yes     Procedure completion:  Tolerated well, no immediate complications   (including critical care time)  Medical Decision Making / ED Course   This patient presents to the ED for  concern of shoulder pain, this involves an extensive number of treatment options, and is a complaint that carries with it a high risk of complications and morbidity.  The differential diagnosis includes dislocation, fracture, contusion, hematoma  MDM: Patient seen emerged part for evaluation of right shoulder injury.  Physical exam with an obvious deformity to the right shoulder and a palpable anterior shoulder dislocation.  X-ray showing anterior shoulder dislocation.  Patient has a baseline heart rate in the 50s and thus we decided against propofol use and perform conscious sedation with ketamine.  The shoulder was successfully reduced and patient placed in a sling.  On reevaluation, patient has returned to normal mental status baseline and is hemodynamically stable prior to discharge.  Pain control provided at time of discharge and patient will need to follow-up outpatient with orthopedic surgery.  Patient then discharged   Additional history obtained: -Additional history obtained from multiple family members -External records from outside source obtained and reviewed including: Chart review including previous notes,  labs, imaging, consultation notes    Imaging Studies ordered: I ordered imaging studies including shoulder x-ray I independently visualized and interpreted imaging. I agree with the radiologist interpretation   Medicines ordered and prescription drug management: Meds ordered this encounter  Medications  . fentaNYL (SUBLIMAZE) injection 50 mcg  . DISCONTD: propofol (DIPRIVAN) 10 mg/mL bolus/IV push 55.4 mg  . PHENYLephrine 80 mcg/ml in normal saline Adult IV Push Syringe (For Blood Pressure Support)  . ketamine 50 mg in normal saline 5 mL (10 mg/mL) syringe  . ketamine HCl 50 MG/5ML SOSY    Oriet, Jonathan: cabinet override    -I have reviewed the patients home medicines and have made adjustments as needed  Critical interventions none  Cardiac Monitoring: The patient was maintained on a cardiac monitor.  I personally viewed and interpreted the cardiac monitored which showed an underlying rhythm of: Sinus bradycardia, NSR  Social Determinants of Health:  Factors impacting patients care include: none   Reevaluation: After the interventions noted above, I reevaluated the patient and found that they have :improved  Co morbidities that complicate the patient evaluation . Past Medical History:  Diagnosis Date  . Asthma   . Biliary colic 2440  . COPD (chronic obstructive pulmonary disease) with chronic bronchitis   . Coronary artery calcification seen on CAT scan   . Cyst of pancreas    1 cm (7/18), repeat ct in 12 months.    . Hyperlipidemia   . Hypertension   . Obstructive sleep apnea   . Renal disorder   . Rhinitis   . Sleep apnea    pt denies sleep apnea-lost weight      Dispostion: I considered admission for this patient, but he does not meet inpatient criteria for admission as shoulder has been successfully reduced and he is safe for discharge with outpatient orthopedic follow-up     Final Clinical Impression(s) / ED Diagnoses Final diagnoses:  None      '@PCDICTATION'$ @    Givanni Staron, Debe Coder, MD 12/31/22 1920

## 2022-12-31 NOTE — ED Provider Triage Note (Addendum)
Emergency Medicine Provider Triage Evaluation Note  Mario [name] , a 77 y.o. male  was evaluated in triage.  Pt complains of right shoulder pain today.  Patient was falling off a ladder holding on with his right shoulder when he felt it was dislocated.  He is on a blood thinner but denies hitting his head or neck.  He was only 2 feet off the ground.  Review of Systems  Positive:  Negative:   Physical Exam  BP (!) 145/91   Pulse (!) 52   Temp 97.7 F (36.5 C) (Oral)   Resp 18   SpO2 98%  Gen:   Awake, no distress   Resp:  Normal effort  MSK:   Moves extremities without difficulty  Other:  He is deformity to the right shoulder.  However pulses.  Compartments are soft.  Sensation intact.  Medical Decision Making  Medically screening exam initiated at 12:22 PM.  Appropriate orders placed.  [name] was informed that the remainder of the evaluation will be completed by another provider, this initial triage assessment does not replace that evaluation, and the importance of remaining in the ED until their evaluation is complete.  XR ordered. Fent ordered.  Addendum, on x-ray read, patient does have an obvious dislocation.  Charge nurse informed the patient is to be next room.   Sherrell Puller, PA-C 12/31/22 [place], PA-C 12/31/22 1226

## 2022-12-31 NOTE — Progress Notes (Signed)
Orthopedic Tech Progress Note Patient Details:  JAY HASKEW 02/11/1946 [id number]  Ortho Devices Type of Ortho Device: Shoulder immobilizer Ortho Device/Splint Location: Right arm Ortho Device/Splint Interventions: Application   Post Interventions Patient Tolerated: Well  Linus Salmons Faron [place]isco 12/31/2022, 1:42 PM

## 2022-12-31 NOTE — ED Triage Notes (Addendum)
Pt arrived via [facility] from home. Pt was on a step ladder trying to take Christmas decorations down and the ladder slipped from under him and he was holding onto the door with his right arm and his shoulder was puling down. Pt states he feels his right shoulder is dislocated. Pt states has numbness and tingling of right arm, but it has decreased some. Pt has 2+ right radial pulse, cap refill less than 3 sec, warm to touch, 4/5 right hand grip. Pt's right arm is hanging down and has an indent in anterior of shoulder.

## 2023-01-03 ENCOUNTER — Telehealth: Payer: Self-pay | Admitting: *Deleted

## 2023-01-03 ENCOUNTER — Encounter: Payer: Self-pay | Admitting: *Deleted

## 2023-01-03 NOTE — Patient Outreach (Signed)
  Care Coordination [facility] Note ED EMMI Alert Transition Care Management Follow-up Telephone Call Date of discharge and from where: [place] on 12/31/22 How have you been since you were released from the hospital? "It's better but I still don't have full use of my arm. I can't lift my arm up but about a foot." Any questions or concerns? No  Items Reviewed: Did the pt receive and understand the discharge instructions provided? Yes  Medications obtained and verified? Yes  Other? Yes . Discussed medication cost. Eliquis and inhalers are expensive. Per patient, doesn't qualify for Rx assistance or Medicare Extra Help. Discussed upcoming appt with cardio, Dr Gwenlyn Found, on 01/17/23. He is taking Eliquis for Afib and will be on it for life. Dad died of a stroke and he worries about that. Recommended he talk with cardio about Watchmen device and see if he thinks he's a good candidate for that. Could d/c Eliquis if he were to have that placed. Discussed shoulder dislocation and reviewed xrays. No MRI.  Any new allergies since your discharge? No  Dietary orders reviewed? Yes Do you have support at home? Yes   Home Care and Equipment/Supplies: Were home health services ordered? no If so, what is the name of the agency?   Has the agency set up a time to come to the patient's home? not applicable Were any new equipment or medical supplies ordered?  No What is the name of the medical supply agency?  Were you able to get the supplies/equipment? not applicable Do you have any questions related to the use of the equipment or supplies? No  Functional Questionnaire: (I = Independent and D = Dependent) ADLs: I  Bathing/Dressing- I  Meal Prep- I  Eating- I  Maintaining continence- I  Transferring/Ambulation- I  Managing Meds- I  Follow up appointments reviewed:  PCP Hospital f/u appt confirmed?  Not indicated   [facility] f/u appt confirmed? No  Provided patient with telephone number for Dr  Lyla Glassing (Ortho) [phone]. Patient will call to schedule appointment. Wanted to give it a few days and see how he feels. Encouraged to go ahead and schedule.  Are transportation arrangements needed? No  If their condition worsens, is the pt aware to call PCP or go to the Emergency Dept.? Yes Was the patient provided with contact information for the PCP's office or ED? Yes Was to pt encouraged to call back with questions or concerns? Yes  SDOH assessments and interventions completed:   Yes SDOH Interventions Today    Flowsheet Row Most Recent Value  SDOH Interventions   Transportation Interventions Intervention Not Indicated       Care Coordination Interventions:  Interventions outlined above    Encounter Outcome:  Pt. Visit Completed    Chong Sicilian, BSN, RN-BC RN Care Coordinator [place]: [phone] Main #: [phone]

## 2023-01-12 ENCOUNTER — Other Ambulatory Visit: Payer: Self-pay | Admitting: Family Medicine

## 2023-01-12 DIAGNOSIS — M25511 Pain in right shoulder: Secondary | ICD-10-CM | POA: Diagnosis not present

## 2023-01-12 NOTE — Telephone Encounter (Signed)
Last OV 12/06/22.  Requested Prescriptions  Pending Prescriptions Disp Refills   hydrochlorothiazide (HYDRODIURIL) 25 MG tablet [Pharmacy Med Name: HYDROCHLOROTHIAZIDE 25 MG TAB] 90 tablet 0    Sig: TAKE 1 TABLET (25 MG TOTAL) BY MOUTH DAILY.     Cardiovascular: Diuretics - Thiazide Failed - 01/12/2023  8:28 AM      Failed - Valid encounter within last 6 months    Recent Outpatient Visits           1 year ago Pure hypercholesterolemia   Mario [name], [name], MD   3 years ago [name], [name], MD   4 years ago Coronary artery calcification seen on CAT scan   [name], MD   4 years ago Community acquired pneumonia of left lower lobe of lung ([place])   [name], PA-C   4 years ago Dyspnea, unspecified type   [name], [name], MD       Future Appointments             In 5 days [name], MD [name] in normal range and within 180 days    Creat  Date Value Ref Range Status  12/06/2022 1.19 0.70 - 1.28 mg/dL Final         Passed - K in normal range and within 180 days    Potassium  Date Value Ref Range Status  12/06/2022 3.9 3.5 - 5.3 mmol/L Final         Passed - Na in normal range and within 180 days    Sodium  Date Value Ref Range Status  12/06/2022 139 135 - 146 mmol/L Final         Passed - Last BP in normal range    BP Readings from Last 1 Encounters:  12/31/22 130/65          pantoprazole (PROTONIX) 40 MG tablet [Pharmacy Med Name: PANTOPRAZOLE SOD DR 40 MG TAB] 180 tablet 0    Sig: TAKE 1 TABLET BY MOUTH TWICE A DAY     Gastroenterology: Proton Pump Inhibitors Failed - 01/12/2023  8:28 AM      Failed - Valid encounter within last 12 months    Recent Outpatient Visits           1 year ago Pure hypercholesterolemia   Mario [name], [name], MD   3 years ago [place], [name], MD   4 years ago Coronary artery calcification seen on CAT scan   [name], MD   4 years ago Community acquired pneumonia of left lower lobe of lung ([place])   [name], PA-C   4 years ago Dyspnea, unspecified type   [place]ario [name], [name], MD       Future Appointments             In 5 days [name], MD [name]             metoprolol succinate (TOPROL-XL) 25 MG 24 hr tablet [Pharmacy Med Name: METOPROLOL SUCC ER 25 MG TAB] 90 tablet 0    Sig: TAKE 1 TABLET (25  MG TOTAL) BY MOUTH DAILY.     Cardiovascular:  Beta Blockers Failed - 01/12/2023  8:28 AM      Failed - Valid encounter within last 6 months    Recent Outpatient Visits           1 year ago Pure hypercholesterolemia   [name], [name], MD   3 years ago [name], [name], MD   4 years ago Coronary artery calcification seen on CAT scan   [name], MD   4 years ago Community acquired pneumonia of left lower lobe of lung ([place])   [name], PA-C   4 years ago Dyspnea, unspecified type   [name], [name], MD       Future Appointments             In 5 days [name], MD [name] BP in normal range    BP Readings from Last 1 Encounters:  12/31/22 130/65         Passed - Last Heart Rate in normal range    Pulse Readings from Last 1 Encounters:  12/31/22 66

## 2023-01-17 ENCOUNTER — Ambulatory Visit
Payer: No Typology Code available for payment source | Attending: Cardiovascular Disease | Admitting: Cardiovascular Disease

## 2023-01-17 ENCOUNTER — Encounter: Payer: Self-pay | Admitting: Cardiovascular Disease

## 2023-01-17 VITALS — BP 124/82 | HR 74 | Ht 69.0 in | Wt 243.0 lb

## 2023-01-17 DIAGNOSIS — G4733 Obstructive sleep apnea (adult) (pediatric): Secondary | ICD-10-CM | POA: Diagnosis not present

## 2023-01-17 DIAGNOSIS — I10 Essential (primary) hypertension: Secondary | ICD-10-CM

## 2023-01-17 DIAGNOSIS — I2510 Atherosclerotic heart disease of native coronary artery without angina pectoris: Secondary | ICD-10-CM | POA: Diagnosis not present

## 2023-01-17 DIAGNOSIS — I480 Paroxysmal atrial fibrillation: Secondary | ICD-10-CM | POA: Diagnosis not present

## 2023-01-17 DIAGNOSIS — E782 Mixed hyperlipidemia: Secondary | ICD-10-CM | POA: Diagnosis not present

## 2023-01-17 MED ORDER — APIXABAN 5 MG PO TABS: 5.0000 mg | ORAL_TABLET | Freq: Two times a day (BID) | ORAL | 5 refills | Status: DC

## 2023-01-17 NOTE — Assessment & Plan Note (Signed)
History of hyperlipidemia on rosuvastatin with lipid profile performed/8/22 revealing total cholesterol 133, LDL 57 and HDL 60.

## 2023-01-17 NOTE — Assessment & Plan Note (Signed)
History of coronary calcification seen on chest CT 09/19/2018.  Myoview performed 05/15/2019 was normal.  Patient denies chest pain.

## 2023-01-17 NOTE — Progress Notes (Signed)
01/17/2023 Mario [name]   Apr 13, 1946  [id number]  Primary Physician Dennard Schaumann [name], MD Primary Cardiologist: [name] MD [place] Car[place], [place]  HPI:  Mario [name] is a 77 y.o.  . 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  I last saw him i for a virtual telemedicine video visit 10/22/21.  He was referred by [name], his PCP, for evaluation and treatment of PAF. He has a history of hypertension, and hyperlipidemia. He does have obstructive sleep apnea on CPAP. He's never had a heart attack or stroke and denies chest pain or shortness of breath. 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 He drinks 2 cups of caffeine a day. He's had palpitations for the last 2 years. Mostly basis lasting up to one hour at a time.   Because of these palpitations, he had an event monitor performed 01/08/2018 revealing principally sinus rhythm with episodes of PAF.  Based on this he has continued his Eliquis oral anticoagulation.  Otherwise, he is asymptomatic specifically denying chest pain or shortness of breath.  He did lose over 50 pounds in the weight watchers program beginning initially 245 and currently weighing 190.  He did have sleep apnea but since his weight loss he no longer wears CPAP.  Since I saw him in the office a year ago he continues to do well.  He has gained weight back and now is 243 but is back on weight watchers.  He continues on Eliquis for now.  He recently fell off a ladder and injured his shoulder.  He denies chest pain or shortness of breath.  He is scheduled for a colonoscopy in the near future.   Current Meds  Medication Sig   acetaminophen (TYLENOL) 500 MG tablet Take 2 tablets (1,000 mg total) by mouth every 8 (eight) hours.   albuterol  (PROAIR HFA) 108 (90 Base) MCG/ACT inhaler Inhale 2 puffs into the lungs every 4 (four) hours as needed for wheezing or shortness of breath.   Ascorbic Acid (VITAMIN C) 1000 MG tablet Take 1,000 mg by mouth 2 (two) times daily.   Cholecalciferol (VITAMIN D3) 125 MCG (5000 UT) TABS Take by mouth.   Coenzyme Q10 (CO Q-10) 100 MG CAPS Take 1 capsule by mouth daily.   ELIQUIS 5 MG TABS tablet TAKE 1 TABLET BY MOUTH TWICE A DAY   FIBER COMPLETE PO Take 1 tablet by mouth daily.   fluticasone-salmeterol (ADVAIR) 250-50 MCG/ACT AEPB Inhale 1 puff into the lungs in the morning and at bedtime.   glucosamine-chondroitin 500-400 MG tablet Take 1 tablet by mouth 3 (three) times daily.   hydrochlorothiazide (HYDRODIURIL) 25 MG tablet TAKE 1 TABLET (25 MG TOTAL) BY MOUTH DAILY.   losartan (COZAAR) 100 MG tablet Take 1 tablet (100 mg total) by mouth daily.   metoprolol succinate (TOPROL-XL) 25 MG 24 hr tablet TAKE 1 TABLET (25 MG TOTAL) BY MOUTH DAILY.   montelukast (SINGULAIR) 10 MG tablet TAKE 1 TABLET BY MOUTH EVERY DAY   pantoprazole (PROTONIX) 40 MG tablet TAKE 1 TABLET BY MOUTH TWICE A DAY   Respiratory Therapy Supplies (FLUTTER) DEVI Use as directed   rosuvastatin (CRESTOR) 20 MG tablet TAKE 1 TABLET BY MOUTH EVERY DAY **D/C LIPITOR**     No Known Allergies  Social History  Socioeconomic History   Marital status: Married    Spouse name: Not on file   Number of children: Not on file   Years of education: Not on file   Highest education level: Not on file  Occupational History   Not on file  Tobacco Use   Smoking status: Former    Packs/day: 2.00    Years: 11.00    Total pack years: 22.00    Types: Cigarettes    Quit date: 12/19/1974    Years since quitting: 48.1   Smokeless tobacco: Never  Vaping Use   Vaping Use: Never used  Substance and Sexual Activity   Alcohol use: Not Currently    Alcohol/week: 1.0 standard drink of alcohol    Types: 1 Glasses of wine per week   Drug use: No    Sexual activity: Not on file  Other Topics Concern   Not on file  Social History Narrative   Not on file   Social Determinants of Health   Financial Resource Strain: Medium Risk (11/23/2022)   Overall Financial Resource Strain (CARDIA)    Difficulty of Paying Living Expenses: Somewhat hard  Food Insecurity: No Food Insecurity (11/23/2022)   Hunger Vital Sign    Worried About Running Out of Food in the Last Year: Never true    [place] in the Last Year: Never true  Transportation Needs: No Transportation Needs (01/03/2023)   PRAPARE - Hydrologist (Medical): No    Lack of Transportation (Non-Medical): No  Physical Activity: Insufficiently Active (11/23/2022)   Exercise Vital Sign    Days of Exercise per Week: 2 days    Minutes of Exercise per Session: 20 min  Stress: No Stress Concern Present (11/23/2022)   [place]    Feeling of Stress : Only a little  Social Connections: Socially Integrated (11/23/2022)   Social Connection and Isolation Panel [NHANES]    Frequency of Communication with Friends and Family: More than three times a week    Frequency of Social Gatherings with Friends and Family: More than three times a week    Attends Religious Services: More than 4 times per year    Active Member of [employer] or Organizations: Yes    Attends Music therapist: More than 4 times per year    Marital Status: Married  Human resources officer Violence: Not At Risk (11/23/2022)   Humiliation, Afraid, Rape, and Kick questionnaire    Fear of Current or Ex-Partner: No    Emotionally Abused: No    Physically Abused: No    Sexually Abused: No     Review of Systems: General: negative for chills, fever, night sweats or weight changes.  Cardiovascular: negative for chest pain, dyspnea on exertion, edema, orthopnea, palpitations, paroxysmal nocturnal dyspnea or shortness of  breath Dermatological: negative for rash Respiratory: negative for cough or wheezing Urologic: negative for hematuria Abdominal: negative for nausea, vomiting, diarrhea, bright red blood per rectum, melena, or hematemesis Neurologic: negative for visual changes, syncope, or dizziness All other systems reviewed and are otherwise negative except as noted above.    Blood pressure 124/82, pulse 74, height '5\' 9"'$  (1.753 m), weight 243 lb (110.2 kg), SpO2 96 %.  General appearance: alert and no distress Neck: no adenopathy, no carotid bruit, no JVD, supple, symmetrical, trachea midline, and thyroid not enlarged, symmetric, no tenderness/mass/nodules Lungs: clear to auscultation bilaterally Heart: regular rate and rhythm, S1, S2 normal,  no murmur, click, rub or gallop Extremities: extremities normal, atraumatic, no cyanosis or edema Pulses: 2+ and symmetric Skin: Skin color, texture, turgor normal. No rashes or lesions Neurologic: Grossly normal  EKG sinus rhythm at 74 with low limb voltage and occasional PVCs.  I personally reviewed this EKG.  ASSESSMENT AND PLAN:   Essential hypertension History of essential hypertension a blood pressure measured today at 124/82.  He is on hydrochlorothiazide, losartan and metoprolol.  Obstructive sleep apnea History of obstructive sleep apnea no longer requiring CPAP  Hyperlipidemia History of hyperlipidemia on rosuvastatin with lipid profile performed/8/22 revealing total cholesterol 133, LDL 57 and HDL 60.  Paroxysmal atrial fibrillation (HCC) History of history of PAF maintaining sinus rhythm on Eliquis oral anticoagulation.  Coronary artery calcification seen on CAT scan History of coronary calcification seen on chest CT 09/19/2018.  Myoview performed 05/15/2019 was normal.  Patient denies chest pain.     [name] MD FACP,FACC,FAHA, [facility] 01/17/2023 4:03 PM

## 2023-01-17 NOTE — Assessment & Plan Note (Signed)
History of history of PAF maintaining sinus rhythm on Eliquis oral anticoagulation.

## 2023-01-17 NOTE — Assessment & Plan Note (Signed)
History of essential hypertension a blood pressure measured today at 124/82.  He is on hydrochlorothiazide, losartan and metoprolol.

## 2023-01-17 NOTE — Patient Instructions (Signed)
Medication Instructions:  Your physician recommends that you continue on your current medications as directed. Please refer to the Current Medication list given to you today.  *If you need a refill on your cardiac medications before your next appointment, please call your pharmacy*   Follow-Up: At [facility], you and your health needs are our priority.  As part of our continuing mission to provide you with exceptional heart care, we have created designated Provider Care Teams.  These Care Teams include your primary Cardiologist (physician) and Advanced Practice Providers (APPs -  Physician Assistants and Nurse Practitioners) who all work together to provide you with the care you need, when you need it.  We recommend signing up for the patient portal called "MyChart".  Sign up information is provided on this After Visit Summary.  MyChart is used to connect with patients for Virtual Visits (Telemedicine).  Patients are able to view lab/test results, encounter notes, upcoming appointments, etc.  Non-urgent messages can be sent to your provider as well.   To learn more about what you can do with MyChart, go to [email or web address].    Your next appointment:   12 month(s)  Provider:   Quay Burow, MD     Other Instructions Cleared at low risk for colonoscopy. Stop eliquis 3 days prior to procedure and restart when GI doctor tells you it is safe.

## 2023-01-17 NOTE — Assessment & Plan Note (Signed)
History of obstructive sleep apnea no longer requiring CPAP

## 2023-01-25 ENCOUNTER — Encounter: Payer: No Typology Code available for payment source | Admitting: Internal Medicine

## 2023-02-03 ENCOUNTER — Telehealth: Payer: Self-pay

## 2023-02-03 NOTE — Telephone Encounter (Signed)
Rolan Lipa w/[facility] called to inform you that pt is enrolled in there COPD management program.  Any questions please send email to [email or web address] or call [phone] Ext [phone]

## 2023-02-09 DIAGNOSIS — M25511 Pain in right shoulder: Secondary | ICD-10-CM | POA: Diagnosis not present

## 2023-02-27 DIAGNOSIS — G5691 Unspecified mononeuropathy of right upper limb: Secondary | ICD-10-CM | POA: Diagnosis not present

## 2023-03-02 ENCOUNTER — Ambulatory Visit (INDEPENDENT_AMBULATORY_CARE_PROVIDER_SITE_OTHER): Payer: No Typology Code available for payment source | Admitting: Family Medicine

## 2023-03-02 ENCOUNTER — Encounter: Payer: Self-pay | Admitting: Family Medicine

## 2023-03-02 VITALS — BP 126/82 | HR 76 | Temp 98.1°F | Ht 69.0 in | Wt 243.0 lb

## 2023-03-02 DIAGNOSIS — J471 Bronchiectasis with (acute) exacerbation: Secondary | ICD-10-CM

## 2023-03-02 DIAGNOSIS — J441 Chronic obstructive pulmonary disease with (acute) exacerbation: Secondary | ICD-10-CM

## 2023-03-02 MED ORDER — DOXYCYCLINE HYCLATE 100 MG PO TABS: 100.0000 mg | ORAL_TABLET | Freq: Two times a day (BID) | ORAL | 0 refills | Status: DC

## 2023-03-02 MED ORDER — PREDNISONE 20 MG PO TABS: 40.0000 mg | ORAL_TABLET | Freq: Every day | ORAL | 0 refills | Status: DC

## 2023-03-02 MED ORDER — BREZTRI AEROSPHERE 160-9-4.8 MCG/ACT IN AERO: 2.0000 | INHALATION_SPRAY | Freq: Two times a day (BID) | RESPIRATORY_TRACT | 11 refills | Status: DC

## 2023-03-02 NOTE — Progress Notes (Signed)
Subjective:    Patient ID: Mario [name], male    DOB: December 20, 1945, 77 y.o.   MRN: [id number] Patient has a history of COPD as well as bronchiectasis.  He presents with a 2-week history of wheezing and coughing.  Been taking some prednisone that he had at home so that his wheezing is slightly better however he still has a cough productive with green sputum.  His cough typically does not produce green sputum.  Has been taking his Advair twice daily as prescribed.  He was previously on Symbicort but his insurance stopped covering that.  He definitely does not feel that the Advair works as well is the Symbicort.  He denies any fevers or chills but he does report shortness of breath Past Medical History:  Diagnosis Date   Asthma    Biliary colic 123456   COPD (chronic obstructive pulmonary disease) with chronic bronchitis    Coronary artery calcification seen on CAT scan    Cyst of pancreas    1 cm (7/18), repeat ct in 12 months.     Hyperlipidemia    Hypertension    Obstructive sleep apnea    Renal disorder    Rhinitis    Sleep apnea    pt denies sleep apnea-lost weight   Past Surgical History:  Procedure Laterality Date   COLONOSCOPY  09/29/2014   LAPAROSCOPIC CHOLECYSTECTOMY SINGLE SITE WITH INTRAOPERATIVE CHOLANGIOGRAM N/A 09/15/2019   Procedure: LAPAROSCOPIC CHOLECYSTECTOMY SINGLE SITE;  Surgeon: Nori Boston, MD;  Location: [facility];  Service: General;  Laterality: N/A;   LITHOTRIPSY  2000   for kidney stone   Current Outpatient Medications on File Prior to Visit  Medication Sig Dispense Refill   albuterol (PROAIR HFA) 108 (90 Base) MCG/ACT inhaler Inhale 2 puffs into the lungs every 4 (four) hours as needed for wheezing or shortness of breath. 8.5 each 2   apixaban (ELIQUIS) 5 MG TABS tablet Take 1 tablet (5 mg total) by mouth 2 (two) times daily. 60 tablet 5   Ascorbic Acid (VITAMIN C) 1000 MG tablet Take 1,000 mg by mouth 2 (two) times daily.     Cholecalciferol (VITAMIN D3) 125  MCG (5000 UT) TABS Take by mouth.     Coenzyme Q10 (CO Q-10) 100 MG CAPS Take 1 capsule by mouth daily.     FIBER COMPLETE PO Take 1 tablet by mouth daily.     glucosamine-chondroitin 500-400 MG tablet Take 1 tablet by mouth 3 (three) times daily.     hydrochlorothiazide (HYDRODIURIL) 25 MG tablet TAKE 1 TABLET (25 MG TOTAL) BY MOUTH DAILY. 90 tablet 0   losartan (COZAAR) 100 MG tablet Take 1 tablet (100 mg total) by mouth daily. 90 tablet 3   metoprolol succinate (TOPROL-XL) 25 MG 24 hr tablet TAKE 1 TABLET (25 MG TOTAL) BY MOUTH DAILY. 90 tablet 0   montelukast (SINGULAIR) 10 MG tablet TAKE 1 TABLET BY MOUTH EVERY DAY 90 tablet 3   pantoprazole (PROTONIX) 40 MG tablet TAKE 1 TABLET BY MOUTH TWICE A DAY 180 tablet 0   Respiratory Therapy Supplies (FLUTTER) DEVI Use as directed 1 each 0   rosuvastatin (CRESTOR) 20 MG tablet TAKE 1 TABLET BY MOUTH EVERY DAY **D/C LIPITOR** 90 tablet 3   No current facility-administered medications on file prior to visit.   No Known Allergies Social History   Socioeconomic History   Marital status: Married    Spouse name: Not on file   Number of children: Not on file  Years of education: Not on file   Highest education level: Not on file  Occupational History   Not on file  Tobacco Use   Smoking status: Former    Packs/day: 2.00    Years: 11.00    Additional pack years: 0.00    Total pack years: 22.00    Types: Cigarettes    Quit date: 12/19/1974    Years since quitting: 48.2   Smokeless tobacco: Never  Vaping Use   Vaping Use: Never used  Substance and Sexual Activity   Alcohol use: Not Currently    Alcohol/week: 1.0 standard drink of alcohol    Types: 1 Glasses of wine per week   Drug use: No   Sexual activity: Not on file  Other Topics Concern   Not on file  Social History Narrative   Not on file   Social Determinants of Health   Financial Resource Strain: Medium Risk (11/23/2022)   Overall Financial Resource Strain (CARDIA)     Difficulty of Paying Living Expenses: Somewhat hard  Food Insecurity: No Food Insecurity (11/23/2022)   Hunger Vital Sign    Worried About Running Out of Food in the Last Year: Never true    Ran Out of Food in the Last Year: Never true  Transportation Needs: No Transportation Needs (01/03/2023)   PRAPARE - Hydrologist (Medical): No    Lack of Transportation (Non-Medical): No  Physical Activity: Insufficiently Active (11/23/2022)   Exercise Vital Sign    Days of Exercise per Week: 2 days    Minutes of Exercise per Session: 20 min  Stress: No Stress Concern Present (11/23/2022)   [place]    Feeling of Stress : Only a little  Social Connections: Socially Integrated (11/23/2022)   Social Connection and Isolation Panel [NHANES]    Frequency of Communication with Friends and Family: More than three times a week    Frequency of Social Gatherings with Friends and Family: More than three times a week    Attends Religious Services: More than 4 times per year    Active Member of [employer] or Organizations: Yes    Attends Music therapist: More than 4 times per year    Marital Status: Married  Human resources officer Violence: Not At Risk (11/23/2022)   Humiliation, Afraid, Rape, and Kick questionnaire    Fear of Current or Ex-Partner: No    Emotionally Abused: No    Physically Abused: No    Sexually Abused: No      Review of Systems  Respiratory:  Positive for cough.   All other systems reviewed and are negative.      Objective:   Physical Exam Vitals reviewed.  Constitutional:      Appearance: He is well-developed.  HENT:     Head: Normocephalic and atraumatic.     Nose: Nose normal.     Mouth/Throat:     Pharynx: No oropharyngeal exudate.  Neck:     Vascular: No carotid bruit.  Cardiovascular:     Rate and Rhythm: Normal rate and regular rhythm.     Heart sounds: Normal heart  sounds.  Pulmonary:     Effort: Pulmonary effort is normal. No respiratory distress.     Breath sounds: No stridor or decreased air movement. Rhonchi and rales present. No wheezing.    Musculoskeletal:     Right lower leg: No edema.     Left lower leg: No  edema.           Assessment & Plan:  Bronchiectasis with (acute) exacerbation (HCC)  COPD exacerbation (HCC) Patient has chronic right basilar crackles due to his bronchiectasis but he has diffuse wheezing as well as diminished breath sounds throughout.  Begin prednisone 40 mg daily for 7 days and use doxycycline 100 mg twice daily for 10 days.  Use albuterol 2 puffs every 4 hours as needed.  Discontinue Advair and replace with Breztri 2 inhalations twice daily

## 2023-03-07 DIAGNOSIS — M25511 Pain in right shoulder: Secondary | ICD-10-CM | POA: Diagnosis not present

## 2023-03-13 ENCOUNTER — Other Ambulatory Visit: Payer: Self-pay | Admitting: Family Medicine

## 2023-03-13 ENCOUNTER — Telehealth: Payer: Self-pay | Admitting: Family Medicine

## 2023-03-13 MED ORDER — LEVOFLOXACIN 500 MG PO TABS: 500.0000 mg | ORAL_TABLET | Freq: Every day | ORAL | 0 refills | Status: AC

## 2023-03-13 NOTE — Telephone Encounter (Signed)
Patient called to follow up on recent appt and script for antibiotics. Patient stated he finished them yesterday but they haven't helped; requesting a different script.  Pharmacy confirmed as  [id number] - [place], [place] [address], [place] [address] Phone: [phone]  Fax: [phone] DEA #: [id number]    Please advise at [phone].

## 2023-03-14 ENCOUNTER — Encounter: Payer: No Typology Code available for payment source | Admitting: Internal Medicine

## 2023-03-22 ENCOUNTER — Ambulatory Visit: Payer: Self-pay

## 2023-03-22 ENCOUNTER — Telehealth: Payer: Self-pay

## 2023-03-22 NOTE — Telephone Encounter (Signed)
Pt called in to request a refill of this med levofloxacin (LEVAQUIN) 500 MG tablet [id number]  ENDED.   LOV: 03/02/23 UPCOMING APPT 03/24/23  PHARMACY: [id number] - [place], [address] - [address], [place] [address] Phone: [phone]  Fax: [phone]

## 2023-03-22 NOTE — Telephone Encounter (Signed)
See Nurse Triage encounter

## 2023-03-22 NOTE — Telephone Encounter (Signed)
Chief Complaint: Cough, requesting refill on Levaquin Symptoms: Wheezing, SOB moderate at rest and with exertion Frequency: Ongoing 2 months Pertinent Negatives: Patient denies chest pain, other symptoms Disposition: [] ED /[] Urgent Care (no appt availability in office) / [x] Appointment(In office/virtual)/ []  [facility]/ [] Home Care/ [] Refused Recommended Disposition /[] [facility]/ []  Follow-up with PCP Additional Notes: Patient was seen in the office on 03/02/23 and prescribed antibiotics, then called on 03/13/23 and was prescribed levaquin. He's calling today wanting a refill on the levaquin saying the cough with green phlegm, SOB has not cleared up. He has upcoming appointment on Friday and would like to be started on something before his appointment afraid that things will get worse before Friday. Advised I will send this request to [name] and someone will call with his recommendation, patient verbalized understanding.  Reason for Disposition  Cough has been present for > 3 weeks  Answer Assessment - Initial Assessment Questions 1. ONSET: "When did the cough begin?"      2 months ago, treated with Levaquin on 03/13/23 2. SEVERITY: "How bad is the cough today?"      Cough all day long 3. SPUTUM: "Describe the color of your sputum" (none, dry cough; clear, white, yellow, green)     Green phlegm 4. HEMOPTYSIS: "Are you coughing up any blood?" If so ask: "How much?" (flecks, streaks, tablespoons, etc.)     No 5. DIFFICULTY BREATHING: "Are you having difficulty breathing?" If Yes, ask: "How bad is it?" (e.g., mild, moderate, severe)    - MILD: No SOB at rest, mild SOB with walking, speaks normally in sentences, can lie down, no retractions, pulse < 100.    - MODERATE: SOB at rest, SOB with minimal exertion and prefers to sit, cannot lie down flat, speaks in phrases, mild retractions, audible wheezing, pulse 100-120.    - SEVERE: Very SOB at rest, speaks in single  words, struggling to breathe, sitting hunched forward, retractions, pulse > 120      Moderate 6. FEVER: "Do you have a fever?" If Yes, ask: "What is your temperature, how was it measured, and when did it start?"     No 7. CARDIAC HISTORY: "Do you have any history of heart disease?" (e.g., heart attack, congestive heart failure)     No 8. LUNG HISTORY: "Do you have any history of lung disease?"  (e.g., pulmonary embolus, asthma, emphysema)     Yes-asthma, COPD 9. PE RISK FACTORS: "Do you have a history of blood clots?" (or: recent major surgery, recent prolonged travel, bedridden)     No 10. OTHER SYMPTOMS: "Do you have any other symptoms?" (e.g., runny nose, wheezing, chest pain)       Wheezing  Protocols used: Cough - Acute Productive-A-AH

## 2023-03-23 ENCOUNTER — Ambulatory Visit
Admission: RE | Admit: 2023-03-23 | Discharge: 2023-03-23 | Disposition: A | Discharge status: Home / Self Care | Payer: No Typology Code available for payment source | Source: Ambulatory Visit | Attending: Family Medicine | Admitting: Family Medicine

## 2023-03-23 ENCOUNTER — Other Ambulatory Visit: Payer: Self-pay

## 2023-03-23 ENCOUNTER — Other Ambulatory Visit: Payer: No Typology Code available for payment source

## 2023-03-23 DIAGNOSIS — J441 Chronic obstructive pulmonary disease with (acute) exacerbation: Secondary | ICD-10-CM

## 2023-03-23 DIAGNOSIS — I700 Atherosclerosis of aorta: Secondary | ICD-10-CM | POA: Diagnosis not present

## 2023-03-23 DIAGNOSIS — J471 Bronchiectasis with (acute) exacerbation: Secondary | ICD-10-CM

## 2023-03-23 DIAGNOSIS — K449 Diaphragmatic hernia without obstruction or gangrene: Secondary | ICD-10-CM | POA: Diagnosis not present

## 2023-03-23 DIAGNOSIS — R051 Acute cough: Secondary | ICD-10-CM

## 2023-03-23 DIAGNOSIS — J4489 Other specified chronic obstructive pulmonary disease: Secondary | ICD-10-CM

## 2023-03-23 DIAGNOSIS — J479 Bronchiectasis, uncomplicated: Secondary | ICD-10-CM

## 2023-03-23 DIAGNOSIS — J984 Other disorders of lung: Secondary | ICD-10-CM | POA: Diagnosis not present

## 2023-03-24 ENCOUNTER — Encounter: Payer: Self-pay | Admitting: Family Medicine

## 2023-03-24 ENCOUNTER — Ambulatory Visit (INDEPENDENT_AMBULATORY_CARE_PROVIDER_SITE_OTHER): Payer: No Typology Code available for payment source | Admitting: Family Medicine

## 2023-03-24 VITALS — BP 116/64 | HR 64 | Temp 98.1°F | Ht 69.0 in | Wt 240.0 lb

## 2023-03-24 DIAGNOSIS — J471 Bronchiectasis with (acute) exacerbation: Secondary | ICD-10-CM

## 2023-03-24 DIAGNOSIS — D509 Iron deficiency anemia, unspecified: Secondary | ICD-10-CM | POA: Diagnosis not present

## 2023-03-24 DIAGNOSIS — J4489 Other specified chronic obstructive pulmonary disease: Secondary | ICD-10-CM | POA: Diagnosis not present

## 2023-03-24 DIAGNOSIS — R051 Acute cough: Secondary | ICD-10-CM | POA: Diagnosis not present

## 2023-03-24 DIAGNOSIS — J479 Bronchiectasis, uncomplicated: Secondary | ICD-10-CM | POA: Diagnosis not present

## 2023-03-24 MED ORDER — LEVOFLOXACIN 500 MG PO TABS: 500.0000 mg | ORAL_TABLET | Freq: Every day | ORAL | 0 refills | Status: AC

## 2023-03-24 NOTE — Progress Notes (Signed)
Subjective:    Patient ID: Mario [name], male    DOB: 1946-03-09, 77 y.o.   MRN: [id number] Patient has a history of COPD as well as bronchiectasis.  Also the patient originally March 14.  Diagnosed him with an exacerbation of bronchiectasis and started him on doxycycline coupled with prednisone and switched him to [place].  Patient called back later having finished the antibiotic seeing no improvement.  At that point I put him on 7 days of Levaquin.  Patient called yesterday stating that he would not better and requested a refill of Levaquin.  I requested to see him.  I repeated a chest x-ray.  The official read has not returned yet however I do not see any significant changes compared to his chest x-ray from 2022.  There is some chronic scarring in the lungs and atelectasis.  Patient dropped off a sputum culture yesterday but obviously the results were not back yet.  Patient states that he was doing better on Levaquin however the symptoms have not completely resolved.  He is concerned that he is going to fall back and relapse.  Today on exam he has chronic Rales and crackles in the right lower lobe posteriorly.  There are no wheezes appreciated today on exam. Past Medical History:  Diagnosis Date   Asthma    Biliary colic 2014   COPD (chronic obstructive pulmonary disease) with chronic bronchitis    Coronary artery calcification seen on CAT scan    Cyst of pancreas    1 cm (7/18), repeat ct in 12 months.     Hyperlipidemia    Hypertension    Obstructive sleep apnea    Renal disorder    Rhinitis    Sleep apnea    pt denies sleep apnea-lost weight   Past Surgical History:  Procedure Laterality Date   COLONOSCOPY  09/29/2014   LAPAROSCOPIC CHOLECYSTECTOMY SINGLE SITE WITH INTRAOPERATIVE CHOLANGIOGRAM N/A 09/15/2019   Procedure: LAPAROSCOPIC CHOLECYSTECTOMY SINGLE SITE;  Surgeon: Karie SodaGross, Steven, MD;  Location: [facility];  Service: General;  Laterality: N/A;   LITHOTRIPSY  2000   for kidney  stone   Current Outpatient Medications on File Prior to Visit  Medication Sig Dispense Refill   albuterol (PROAIR HFA) 108 (90 Base) MCG/ACT inhaler Inhale 2 puffs into the lungs every 4 (four) hours as needed for wheezing or shortness of breath. 8.5 each 2   apixaban (ELIQUIS) 5 MG TABS tablet Take 1 tablet (5 mg total) by mouth 2 (two) times daily. 60 tablet 5   Ascorbic Acid (VITAMIN C) 1000 MG tablet Take 1,000 mg by mouth 2 (two) times daily.     Budeson-Glycopyrrol-Formoterol (BREZTRI AEROSPHERE) 160-9-4.8 MCG/ACT AERO Inhale 2 puffs into the lungs 2 (two) times daily. 10.7 g 11   Cholecalciferol (VITAMIN D3) 125 MCG (5000 UT) TABS Take by mouth.     Coenzyme Q10 (CO Q-10) 100 MG CAPS Take 1 capsule by mouth daily.     FIBER COMPLETE PO Take 1 tablet by mouth daily.     hydrochlorothiazide (HYDRODIURIL) 25 MG tablet TAKE 1 TABLET (25 MG TOTAL) BY MOUTH DAILY. 90 tablet 0   losartan (COZAAR) 100 MG tablet Take 1 tablet (100 mg total) by mouth daily. 90 tablet 3   metoprolol succinate (TOPROL-XL) 25 MG 24 hr tablet TAKE 1 TABLET (25 MG TOTAL) BY MOUTH DAILY. 90 tablet 0   montelukast (SINGULAIR) 10 MG tablet TAKE 1 TABLET BY MOUTH EVERY DAY 90 tablet 3   pantoprazole (PROTONIX)  40 MG tablet TAKE 1 TABLET BY MOUTH TWICE A DAY 180 tablet 0   Respiratory Therapy Supplies (FLUTTER) DEVI Use as directed 1 each 0   rosuvastatin (CRESTOR) 20 MG tablet TAKE 1 TABLET BY MOUTH EVERY DAY **D/C LIPITOR** 90 tablet 3   No current facility-administered medications on file prior to visit.   No Known Allergies Social History   Socioeconomic History   Marital status: Married    Spouse name: Not on file   Number of children: Not on file   Years of education: Not on file   Highest education level: Bachelor's degree (e.g., BA, AB, BS)  Occupational History   Not on file  Tobacco Use   Smoking status: Former    Packs/day: 2.00    Years: 11.00    Additional pack years: 0.00    Total pack years:  22.00    Types: Cigarettes    Quit date: 12/19/1974    Years since quitting: 48.2   Smokeless tobacco: Never  Vaping Use   Vaping Use: Never used  Substance and Sexual Activity   Alcohol use: Not Currently    Alcohol/week: 1.0 standard drink of alcohol    Types: 1 Glasses of wine per week   Drug use: No   Sexual activity: Not on file  Other Topics Concern   Not on file  Social History Narrative   Not on file   Social Determinants of Health   Financial Resource Strain: Medium Risk (03/22/2023)   Overall Financial Resource Strain (CARDIA)    Difficulty of Paying Living Expenses: Somewhat hard  Food Insecurity: No Food Insecurity (03/22/2023)   Hunger Vital Sign    Worried About Running Out of Food in the Last Year: Never true    Ran Out of Food in the Last Year: Never true  Transportation Needs: No Transportation Needs (03/22/2023)   PRAPARE - Administrator, Civil ServiceTransportation    Lack of Transportation (Medical): No    Lack of Transportation (Non-Medical): No  Physical Activity: Insufficiently Active (03/22/2023)   Exercise Vital Sign    Days of Exercise per Week: 4 days    Minutes of Exercise per Session: 20 min  Stress: No Stress Concern Present (03/22/2023)   [employer] - Occupational Stress Questionnaire    Feeling of Stress : Not at all  Social Connections: Socially Integrated (03/22/2023)   Social Connection and Isolation Panel [NHANES]    Frequency of Communication with Friends and Family: More than three times a week    Frequency of Social Gatherings with Friends and Family: Twice a week    Attends Religious Services: More than 4 times per year    Active Member of [employer] or Organizations: Yes    Attends Engineer, structuralClub or Organization Meetings: More than 4 times per year    Marital Status: Married  Catering managerntimate Partner Violence: Not At Risk (11/23/2022)   Humiliation, Afraid, Rape, and Kick questionnaire    Fear of Current or Ex-Partner: No    Emotionally Abused: No    Physically  Abused: No    Sexually Abused: No      Review of Systems  Respiratory:  Positive for cough.   All other systems reviewed and are negative.      Objective:   Physical Exam Vitals reviewed.  Constitutional:      Appearance: He is well-developed.  HENT:     Head: Normocephalic and atraumatic.     Nose: Nose normal.     Mouth/Throat:  Pharynx: No oropharyngeal exudate.  Neck:     Vascular: No carotid bruit.  Cardiovascular:     Rate and Rhythm: Normal rate and regular rhythm.     Heart sounds: Normal heart sounds.  Pulmonary:     Effort: Pulmonary effort is normal. No respiratory distress.     Breath sounds: No stridor or decreased air movement. Rhonchi and rales present. No wheezing.    Musculoskeletal:     Right lower leg: No edema.     Left lower leg: No edema.           Assessment & Plan:  Iron deficiency anemia, unspecified iron deficiency anemia type - Plan: CBC with Differential/Platelet, Iron  Bronchiectasis with (acute) exacerbation  Patient has chronic right basilar crackles due to his bronchiectasis.  At this point, I feel that we have maximized medical therapy.  He is currently on breztri 2 inhalations twice daily.  He is on maximum therapy for allergies including Singulair.  He is on a proton pump inhibitor.  I will extend Levaquin for 10 days since he was improving.  Await the results of the sputum culture to help direct therapy.  However I recommended that the patient follow-up with his pulmonologist.  At this point I do not know what else to do to help with his underlying condition as he seems to be having more frequent attacks.  Patient does report feeling tired so he would like to recheck his hemoglobin and his iron level given that he was diagnosed with iron deficiency anemia

## 2023-03-25 LAB — CBC WITH DIFFERENTIAL/PLATELET
Absolute Monocytes: 704 cells/uL (ref 200–950)
Basophils Absolute: 48 cells/uL (ref 0–200)
Basophils Relative: 0.6 %
Eosinophils Absolute: 304 cells/uL (ref 15–500)
Eosinophils Relative: 3.8 %
HCT: 43.3 % (ref 38.5–50.0)
Hemoglobin: 14.6 g/dL (ref 13.2–17.1)
Lymphs Abs: 1888 cells/uL (ref 850–3900)
MCH: 32.2 pg (ref 27.0–33.0)
MCHC: 33.7 g/dL (ref 32.0–36.0)
MCV: 95.6 fL (ref 80.0–100.0)
MPV: 9.5 fL (ref 7.5–12.5)
Monocytes Relative: 8.8 %
Neutro Abs: 5056 cells/uL (ref 1500–7800)
Neutrophils Relative %: 63.2 %
Platelets: 248 10*3/uL (ref 140–400)
RBC: 4.53 10*6/uL (ref 4.20–5.80)
RDW: 11.9 % (ref 11.0–15.0)
Total Lymphocyte: 23.6 %
WBC: 8 10*3/uL (ref 3.8–10.8)

## 2023-03-25 LAB — IRON: Iron: 64 ug/dL (ref 50–180)

## 2023-03-29 LAB — RESPIRATORY CULTURE OR RESPIRATORY AND SPUTUM CULTURE
MICRO NUMBER:: 14788417
SPECIMEN QUALITY:: ADEQUATE

## 2023-04-05 DIAGNOSIS — M6281 Muscle weakness (generalized): Secondary | ICD-10-CM | POA: Diagnosis not present

## 2023-04-05 DIAGNOSIS — M25511 Pain in right shoulder: Secondary | ICD-10-CM | POA: Diagnosis not present

## 2023-04-05 DIAGNOSIS — M25611 Stiffness of right shoulder, not elsewhere classified: Secondary | ICD-10-CM | POA: Diagnosis not present

## 2023-04-07 DIAGNOSIS — M25511 Pain in right shoulder: Secondary | ICD-10-CM | POA: Diagnosis not present

## 2023-04-07 DIAGNOSIS — M6281 Muscle weakness (generalized): Secondary | ICD-10-CM | POA: Diagnosis not present

## 2023-04-07 DIAGNOSIS — M25611 Stiffness of right shoulder, not elsewhere classified: Secondary | ICD-10-CM | POA: Diagnosis not present

## 2023-04-09 ENCOUNTER — Other Ambulatory Visit: Payer: Self-pay | Admitting: Family Medicine

## 2023-04-10 ENCOUNTER — Other Ambulatory Visit: Payer: Self-pay | Admitting: Family Medicine

## 2023-04-10 ENCOUNTER — Ambulatory Visit (AMBULATORY_SURGERY_CENTER): Payer: No Typology Code available for payment source

## 2023-04-10 VITALS — Ht 69.0 in | Wt 245.0 lb

## 2023-04-10 DIAGNOSIS — Z86010 Personal history of colonic polyps: Secondary | ICD-10-CM

## 2023-04-10 DIAGNOSIS — D500 Iron deficiency anemia secondary to blood loss (chronic): Secondary | ICD-10-CM

## 2023-04-10 MED ORDER — PEG 3350-KCL-NA BICARB-NACL 420 G PO SOLR: 4000.0000 mL | Freq: Once | ORAL | 0 refills | Status: AC

## 2023-04-10 NOTE — Progress Notes (Signed)
No egg or soy allergy known to patient  No issues known to pt with past sedation with any surgeries or procedures Patient denies ever being told they had issues or difficulty with intubation  No FH of Malignant Hyperthermia Pt is not on diet pills Pt is not on  home 02  Pt is not on blood thinners  Pt denies issues with constipation  A fib 2 Day hold on Eliquis.     No A flutter Have any cardiac testing pending--no Pt instructed to use Singlecare.[email or web address] or GoodRx for a price reduction on prep  Patient's chart reviewed by Cathlyn Parsons CNRA prior to previsit and patient appropriate for the [facility].  Previsit completed and red dot placed by patient's name on their procedure day (on provider's schedule).   Able to ambulate from chair to bed without assistance.

## 2023-04-11 ENCOUNTER — Telehealth: Payer: Self-pay

## 2023-04-11 DIAGNOSIS — M25511 Pain in right shoulder: Secondary | ICD-10-CM | POA: Diagnosis not present

## 2023-04-11 NOTE — Telephone Encounter (Signed)
During pre visit with patient, he stated that he wanted to cancel the EGD. He was no longer anemic and did not want to continue with the EGD.  Instructions were reviewed for the upcoming colonoscopy, and EGD cancelled.

## 2023-04-22 DIAGNOSIS — M25511 Pain in right shoulder: Secondary | ICD-10-CM | POA: Diagnosis not present

## 2023-04-25 ENCOUNTER — Encounter: Payer: Self-pay | Admitting: Internal Medicine

## 2023-04-29 NOTE — Progress Notes (Unsigned)
Mario [name], male    DOB: December 21, 1945, 77 y.o.   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 with def correlation with his symptoms  but resolved by age 79 then recurred while smoking in 1976 and seemed to get worse while in [place]> pulmonary eval rx and happy with results but on daily meds ever since moved to [place] in 1991 and saw [place] allergy in 2000 for placed on shots x maybe a year but no change in maint therapy rx which seemed to help but freq flares changed from symbicort/spiriva  to trelegy then much worse while on trelegy which was around 03/2018 with cough/ wheeze>  On his own switched back to symbicort/spiriva around 06/25/18 and referred to pulmonary clinic 07/05/2018 by Dr   Mario [name].    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 Tanya [name]. Pt c/o cough and SOB x 3 months. He is coughing up clear sputum, esp worse in the am. He uses proair 1-2 x daily on average and has neb with albuterol that he rarely uses.   has sensation "lungs full of congestion" but doesn't cough much up and what he does is clear  cpap helps at hs / sleeps  on R side and bed is flat/ on left side side down wheezing keeps him up even on cpap Last pred 10 days prior to OV  And not better whereas previously worked great. Finished the abx about the time of the last ct 06/26/18  rec Bronchiectasis =   you have scarring of your bronchial tubes which means that they don't function perfectly normally and mucus tends to pool in certain areas of your lung which can cause pneumonia and further scarring of your lung and bronchial tubes Whenever you develop cough congestion take mucinex or mucinex dm > these will help keep the mucus loose and flowing but  if your condition worsens you need to seek help immediately preferably here or somewhere inside the [facility] system to compare xrays ( worse = darker or bloody mucus or pain on breathing in)   Use the flutter valve as much as possible Protonix 40 mg Take 30- 60 min before your first and last meals of the day  GERD (REFLUX)    schedule sinus CT > pos > ENT eval req Please schedule a follow up office visit in 4 weeks, sooner if needed with PFTs on return    08/13/2018  f/u ov/Mario [name] re: 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 Pt has c/o SOB with exertion, cough with clear mucus, occ. chest tightness.  rec Plan A = Automatic = symbiocort 160 x 2 and spiriva x 2 pffs  first thing in am  then 12 hours later Symbiort x 160 mg Work on inhaler technique:  Plan B = Backup Only use your albuterol as a rescue medication Plan C = Crisis - only use your albuterol nebulizer if you first try Plan B and it fails to help > ok to use the nebulizer up to every 4 hours but  if start needing it regularly call for immediate appointment   03/15/2021  f/u ov/Mario [name] re: obst bronchiectasis / main on sym b 160  Chief Complaint  Patient presents with   Follow-up    Coughing and wheezing at night  Dyspnea:  yardwork ok  Cough: at usual level  Re cough/  wheezing but worse since finished last  last zpak / no purulent sputum Sleeping: bed with bed blocks  SABA use: no saba  02: none  Rec Nasty mucus >  zpak Thick mucus/ congestions > flutter and mucinex  Please schedule a follow up visit in 6  months but call sooner if needed    09/17/21 NP recs Finish Zpack as directed.  Continue on Symbicort 2 puffs Twice daily  , rinse after use.  Albuterol inhaler or neb As needed   Mucinex Twice daily  As needed   Flutter valve Twice daily   Changed to Advair then to [facility] on 2 bid    05/01/2023   f/u ov/Mario [name] re: obst bronchiectasis maint on breztri   using flutter valve once a day   Chief Complaint  Patient presents with   Follow-up    Chest congestion with green mucus.  Has had 3 episodes since January 2024.  Dyspnea:  still some yard work  Cough: mucus cleared up with levaquin back green again this am also has some green nasal production - on ppi one mistake  Sleeping: bed has bed blocks > cough and wheeze  SABA use: tid including hs  02: none      No obvious day to day or daytime variability or assoc   mucus plugs or hemoptysis or cp or chest tightness, subjective wheeze or overt  hb symptoms.    . Also denies any obvious fluctuation of symptoms with weather or environmental changes or other aggravating or alleviating factors except as outlined above   No unusual exposure hx or h/o childhood pna/ asthma or knowledge of premature birth.  Current Allergies, Complete Past Medical History, Past Surgical History, Family History, and Social History were reviewed in [employer] record.  ROS  The following are not active complaints unless bolded Hoarseness, sore throat, dysphagia, dental problems, itching, sneezing,  nasal congestion or discharge of excess mucus or purulent secretions, ear ache,   fever, chills, sweats, unintended wt loss or wt gain, classically pleuritic or exertional cp,  orthopnea pnd or arm/hand swelling  or leg swelling, presyncope, palpitations, abdominal pain, anorexia, nausea, vomiting, diarrhea  or change in bowel habits or change in bladder habits, change in stools or change in urine, dysuria, hematuria,  rash, arthralgias, visual complaints, headache, numbness, weakness or ataxia or problems with walking or coordination,  change in mood or  memory. Dislocated R shoulder         Current Meds  Medication Sig   albuterol (PROAIR HFA) 108 (90 Base) MCG/ACT inhaler Inhale 2 puffs into the lungs every 4 (four) hours as needed for wheezing or  shortness of breath.   apixaban (ELIQUIS) 5 MG TABS tablet Take 1 tablet (5 mg total) by mouth 2 (two) times daily.   Ascorbic Acid (VITAMIN C) 1000 MG tablet Take 1,000 mg by mouth 2 (two) times daily.   Budeson-Glycopyrrol-Formoterol (BREZTRI AEROSPHERE) 160-9-4.8 MCG/ACT AERO Inhale 2 puffs into the lungs 2 (two) times daily.   Cholecalciferol (VITAMIN D3) 125 MCG (5000 UT) TABS Take by mouth.   Coenzyme Q10 (CO Q-10) 100 MG CAPS Take 1 capsule by mouth  daily.   FIBER COMPLETE PO Take 1 tablet by mouth daily.   hydrochlorothiazide (HYDRODIURIL) 25 MG tablet TAKE 1 TABLET (25 MG TOTAL) BY MOUTH DAILY.   losartan (COZAAR) 100 MG tablet Take 1 tablet (100 mg total) by mouth daily.   metoprolol succinate (TOPROL-XL) 25 MG 24 hr tablet TAKE 1 TABLET (25 MG TOTAL) BY MOUTH DAILY.   montelukast (SINGULAIR) 10 MG tablet TAKE 1 TABLET BY MOUTH EVERY DAY   pantoprazole (PROTONIX) 40 MG tablet TAKE 1 TABLET BY MOUTH TWICE A DAY   Respiratory Therapy Supplies (FLUTTER) DEVI Use as directed   rosuvastatin (CRESTOR) 20 MG tablet TAKE 1 TABLET BY MOUTH EVERY DAY **D/C LIPITOR**                 Objective:    Wts  05/01/2023       243  03/15/2021       221 06/27/2019         192  12/25/2018         205  09/24/2018       212  08/13/2018       218   07/05/18 224 lb (101.6 kg)  06/12/18 230 lb (104.3 kg)  05/25/18 233 lb (105.7 kg)    Vital signs reviewed  05/01/2023  - Note at rest 02 sats  95% on RA   General appearance:    amb mod obese wm pan exp rhonchi     HEENT : Oropharynx  clear   Nasal turbinates mod edema/ no polyps    NECK :  without  apparent JVD/ palpable Nodes/TM    LUNGS: no acc muscle use,  Min barrel  contour chest wall with bilateral  mid exp rhonchi  and  without cough on insp or exp maneuvers and min  Hyperresonant  to  percussion bilaterally    CV:  RRR  no s3 or murmur or increase in P2, and no edema   ABD:  obese soft and nontender    MS:  Nl gait/ ext warm without  deformities Or obvious joint restrictions  calf tenderness, cyanosis or clubbing     SKIN: warm and dry without lesions    NEURO:  alert, approp, nl sensorium with  no motor or cerebellar deficits apparent.         I personally reviewed images and agree with radiology impression as follows:  CXR:   pa and plateral  03/23/23 1. No radiographic evidence of acute cardiopulmonary disease. Mild bibasilar scarring, similar to prior studies. 2. Moderate-sized hiatal hernia. 3. Aortic atherosclerosis.    Assessment

## 2023-05-01 ENCOUNTER — Encounter: Payer: Self-pay | Admitting: Internal Medicine

## 2023-05-01 ENCOUNTER — Ambulatory Visit (INDEPENDENT_AMBULATORY_CARE_PROVIDER_SITE_OTHER): Payer: No Typology Code available for payment source | Admitting: Internal Medicine

## 2023-05-01 ENCOUNTER — Telehealth: Payer: Self-pay | Admitting: Internal Medicine

## 2023-05-01 VITALS — BP 124/68 | HR 80 | Temp 98.6°F | Ht 69.0 in | Wt 243.6 lb

## 2023-05-01 DIAGNOSIS — J479 Bronchiectasis, uncomplicated: Secondary | ICD-10-CM | POA: Diagnosis not present

## 2023-05-01 DIAGNOSIS — R058 Other specified cough: Secondary | ICD-10-CM | POA: Diagnosis not present

## 2023-05-01 MED ORDER — CIPROFLOXACIN HCL 500 MG PO TABS: 500.0000 mg | ORAL_TABLET | Freq: Two times a day (BID) | ORAL | 11 refills | Status: DC

## 2023-05-01 MED ORDER — MOMETASONE FURO-FORMOTEROL FUM 100-5 MCG/ACT IN AERO: INHALATION_SPRAY | RESPIRATORY_TRACT | 11 refills | Status: DC

## 2023-05-01 MED ORDER — METHYLPREDNISOLONE ACETATE 80 MG/ML IJ SUSP
120.0000 mg | Freq: Once | INTRAMUSCULAR | Status: AC
  Administered 2023-05-01: 120 mg via INTRAMUSCULAR

## 2023-05-01 NOTE — Assessment & Plan Note (Addendum)
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  Both mastoid sinuses are clear.  Mild to moderate nasal septal deviation. - HRCT chest 09/19/2018  1. Mild cylindrical and varicoid bronchiectasis in the mid to lower lungs, unchanged. Associated mild bronchial wall thickening is slightly improved. 2. Stable scattered parenchymal bands in the mid to lower lungs, compatible with postinfectious/postinflammatory scarring. 3. Two month stability of 5 mm apical right upper lobe solid pulmonary nodule, more likely benign. Suggest follow-up chest CT in 6-12 months given the history of smoking and given that this nodule was previously seen to be new compared to 07/07/2017 chest CT. 4. Three-vessel coronary atherosclerosis. 5. Moderate hiatal hernia.  - flare since first of 2024 on advair then breztri  - 05/01/2023 rec change back to symbicort 80 vs dulera 100 plus cipro x 10day course and sinus CT / max gerd rx   rec When coughing > pantoprazole should Take 30- 60 min before your first and last meals of the day use the mucinex and flutter valve as much as possible   Cipro 500 mg twice daily x 10 days (refillable)   Reduce breztri for now to one puff twice daily until you get Dulera 100 Take 2 puffs first thing in am and then another 2 puffs about 12 hours later.    Only use your albuterol as a rescue medication to be used if you can't catch your breath by resting or doing a relaxed purse lip breathing pattern.  - The less you use it, the better it will work when you need it. - Ok to use up to 2 puffs  every 4 hours if  you must but call for immediate appointment if use goes up over your usual need - Don't leave home without it !!  (think of it like the spare tire for your car)   Depomedrol 120 mg IM   My office will be contacting you by phone for referral to sinus CT - if you don't hear back from my office within one week please call us back or notify us thru MyChart and we'll address it right away.    Please schedule a follow up office visit in 2weeks to see NP with all meds on hand          Each maintenance medication was reviewed in detail including emphasizing most importantly the difference between maintenance and prns and under what circumstances the prns are to be triggered using an action plan format where appropriate.  Total time for H and P, chart review, counseling, reviewing hfa device(s) and generating customized AVS unique to this office visit / same day charting > 30 min for multiple  refractory respiratory  symptoms of uncertain etiology

## 2023-05-01 NOTE — Telephone Encounter (Signed)
Patient called stating he has developed Bronchitis. He is wondering if he should still come for his procedure on 05/15. Requesting a call back to be advised further. Thank you.

## 2023-05-01 NOTE — Telephone Encounter (Signed)
Patient seen by Pulmonology today and started on antibiotics. He has productive cough and wheezing. Colonoscopy canceled.

## 2023-05-01 NOTE — Patient Instructions (Addendum)
When coughing > pantoprazole should Take 30- 60 min before your first and last meals of the day use the mucinex and flutter valve as much as possible   Cipro 500 mg twice daily x 10 days (refillable)   Reduce breztri for now to one puff twice daily until you get Dulera 100 Take 2 puffs first thing in am and then another 2 puffs about 12 hours later.    Only use your albuterol as a rescue medication to be used if you can't catch your breath by resting or doing a relaxed purse lip breathing pattern.  - The less you use it, the better it will work when you need it. - Ok to use up to 2 puffs  every 4 hours if you must but call for immediate appointment if use goes up over your usual need - Don't leave home without it !!  (think of it like the spare tire for your car)   Depomedrol 120 mg IM   My office will be contacting you by phone for referral to sinus CT - if you don't hear back from my office within one week please call us back or notify us thru MyChart and we'll address it right away.    Please schedule a follow up office visit in 2weeks to see NP with all meds on hand

## 2023-05-02 ENCOUNTER — Encounter: Payer: Self-pay | Admitting: Internal Medicine

## 2023-05-02 DIAGNOSIS — M25511 Pain in right shoulder: Secondary | ICD-10-CM | POA: Diagnosis not present

## 2023-05-02 NOTE — Addendum Note (Signed)
Addended by: Lajoyce Lauber A on: 05/02/2023 02:30 PM   Modules accepted: Orders

## 2023-05-03 ENCOUNTER — Encounter: Payer: No Typology Code available for payment source | Admitting: Internal Medicine

## 2023-05-05 NOTE — Progress Notes (Signed)
This encounter was created in error - please disregard.

## 2023-05-12 ENCOUNTER — Other Ambulatory Visit: Payer: Self-pay | Admitting: Family Medicine

## 2023-05-12 DIAGNOSIS — J441 Chronic obstructive pulmonary disease with (acute) exacerbation: Secondary | ICD-10-CM

## 2023-05-14 ENCOUNTER — Ambulatory Visit (HOSPITAL_BASED_OUTPATIENT_CLINIC_OR_DEPARTMENT_OTHER)
Admission: RE | Admit: 2023-05-14 | Discharge: 2023-05-14 | Disposition: A | Discharge status: Home / Self Care | Payer: No Typology Code available for payment source | Source: Ambulatory Visit | Attending: Internal Medicine | Admitting: Internal Medicine

## 2023-05-14 DIAGNOSIS — J3489 Other specified disorders of nose and nasal sinuses: Secondary | ICD-10-CM | POA: Diagnosis not present

## 2023-05-14 DIAGNOSIS — J479 Bronchiectasis, uncomplicated: Secondary | ICD-10-CM | POA: Insufficient documentation

## 2023-05-16 ENCOUNTER — Other Ambulatory Visit: Payer: Self-pay | Admitting: Family Medicine

## 2023-05-17 ENCOUNTER — Encounter: Payer: Self-pay | Admitting: Primary Care

## 2023-05-17 ENCOUNTER — Telehealth: Payer: Self-pay | Admitting: Primary Care

## 2023-05-17 ENCOUNTER — Ambulatory Visit (INDEPENDENT_AMBULATORY_CARE_PROVIDER_SITE_OTHER): Payer: No Typology Code available for payment source | Admitting: Primary Care

## 2023-05-17 VITALS — BP 118/68 | HR 65 | Ht 69.0 in | Wt 242.0 lb

## 2023-05-17 DIAGNOSIS — K449 Diaphragmatic hernia without obstruction or gangrene: Secondary | ICD-10-CM

## 2023-05-17 DIAGNOSIS — J4540 Moderate persistent asthma, uncomplicated: Secondary | ICD-10-CM | POA: Diagnosis not present

## 2023-05-17 DIAGNOSIS — J320 Chronic maxillary sinusitis: Secondary | ICD-10-CM | POA: Diagnosis not present

## 2023-05-17 MED ORDER — BUDESONIDE-FORMOTEROL FUMARATE 160-4.5 MCG/ACT IN AERO: 2.0000 | INHALATION_SPRAY | Freq: Two times a day (BID) | RESPIRATORY_TRACT | 6 refills | Status: DC

## 2023-05-17 NOTE — Telephone Encounter (Signed)
Can we submit a PA for Symbicort 160 He has tried and failed Advair, dulera and breztri

## 2023-05-17 NOTE — Assessment & Plan Note (Signed)
-   Continues to have wheezing symptoms. He did not notice much difference while taking Dulera and inhaler cost him >100 dollars. He is requiring Albuterol rescue inhaler 3-4 times a day. Asthma symptoms were the most controlled on Symbicort . He tried and failed Advair, Dulera and Breztri. Resume Symbicort two puffs morning and evening (RX printed, sent message to our pharmacist team about initiating PA for medication). If this is not approved, would consider increasing Dulera to 1-2 puffs twice daily (13 gram)

## 2023-05-17 NOTE — Patient Instructions (Addendum)
CT sinuses >> Chronic paranasal sinus disease as described. There is progressive mucosal thickening both maxillary sinuses from 2019 exam. 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  Recommendations: - Resume Symbicort two puffs morning and evening (RX printed, I sent a message to our pharmacist team about initiating a PA for medication). If this is not approved, would consider increasing Dulera to (13 gram size)  - Continue Singulair 10mg  at bedtime - Continue Flonase nasal spray and saline nasal rinses as needed - Continue mucinex and flutter valve to loosen chest congestion/cough - Please reach out to ENT on [address], if you need referral let me know  Follow-up -3 months with Dr. Sherene Sires

## 2023-05-17 NOTE — Assessment & Plan Note (Signed)
-   CT sinuses showed chronic paranasal sinus disease as described. There is progressive mucosal thickening both maxillary sinuses from 2019 exam. 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 - He has chronic sinus congestion but denies acute sinus pressure or pain  - Continue fluticasone nasal spray and prn saline nasal rinses - Advised patient follow-up with ENT, previously seen at [facility] street location- he will let us know if he needs new referral

## 2023-05-17 NOTE — Progress Notes (Unsigned)
@Patient  ID: Mario [name], male    DOB: March 14, 1946, 77 y.o.   MRN: [id number]  Chief Complaint  Patient presents with   Follow-up    ACT 11     Referring provider: Donita Brooks, MD  HPI: 77 year old male, former smoker. PMH significant afib, HTN, CAD, bronchiectasis, asthma, chronic frontal sinusitis, hiatal hernia, OSA. Patient of Dr. Sherene Sires.   05/17/2023 Recently saw Dr. Sherene Sires on 05/01/2023 for bronchiectasis/ cough. Received Depo-Medrol 120 mg IM and given a prescription for ciprofloxacin 500 mg twice daily x 10 days. Advised to take pantoprazole twice daily, mucinex and use flutter valve as much as possible. He was changed from [place] to [facility] 100 two puffs q12 hours. Ordered for CT sinuses.   Presents today for 2 week follow-up. Abx and steroid shot seemed to clear up his chest infection/bronchitis symptoms. He has been wheezing at night which keeps him up. He coughs during the day. Cough is now mainly dry. He was without symptoms when he was on Symbicort, did not require SABA while on high dose ICS/LABA. Needs PA. Tried Advair, breztri and [employer]. Now requiring Albuterol 3-4 times a day due to wheezing and cough symptoms. He is complaint with pantoprazole medication twice daily. He is using mucinex and flutter as directed. CT sinuses showed chronic paranasal sinus disease. He does have sinus congestion. He takes Singulair 10mg  daily. He uses flonase every night, 3-4 times a week he uses saline rinse. Denies sinus pressure or pain.   No Known Allergies  Immunization History  Administered Date(s) Administered   Fluad Quad(high Dose 65+) 09/09/2019   Influenza Split 09/09/2013   Influenza, High Dose Seasonal PF 09/22/2015, 09/27/2016, 09/27/2016, 09/11/2017   Influenza,inj,Quad PF,6+ Mos 09/03/2018   Influenza-Unspecified 09/18/2014   PFIZER(Purple Top)SARS-COV-2 Vaccination 02/05/2020, 02/26/2020   Pneumococcal Conjugate-13 02/17/2014   Pneumococcal Polysaccharide-23  10/13/2009, 04/16/2015   Tdap 03/13/2007   Zoster, Live 05/29/2015    Past Medical History:  Diagnosis Date   Asthma    Biliary colic 2014   COPD (chronic obstructive pulmonary disease) with chronic bronchitis    Coronary artery calcification seen on CAT scan    Cyst of pancreas    1 cm (7/18), repeat ct in 12 months.     Hyperlipidemia    Hypertension    Obstructive sleep apnea    Renal disorder    Rhinitis    Sleep apnea    pt denies sleep apnea-lost weight    Tobacco History: Social History   Tobacco Use  Smoking Status Former   Packs/day: 2.00   Years: 11.00   Additional pack years: 0.00   Total pack years: 22.00   Types: Cigarettes   Quit date: 12/19/1974   Years since quitting: 48.4  Smokeless Tobacco Never   Counseling given: Not Answered   Outpatient Medications Prior to Visit  Medication Sig Dispense Refill   albuterol (VENTOLIN HFA) 108 (90 Base) MCG/ACT inhaler INHALE 2 PUFFS INTO THE LUNGS EVERY 4 HOURS AS NEEDED FOR WHEEZING OR SHORTNESS OF BREATH. 8.5 each 2   apixaban (ELIQUIS) 5 MG TABS tablet Take 1 tablet (5 mg total) by mouth 2 (two) times daily. 60 tablet 5   Ascorbic Acid (VITAMIN C) 1000 MG tablet Take 1,000 mg by mouth 2 (two) times daily.     Cholecalciferol (VITAMIN D3) 125 MCG (5000 UT) TABS Take by mouth.     ciprofloxacin (CIPRO) 500 MG tablet Take 1 tablet (500 mg total) by mouth 2 (two) times daily.  20 tablet 11   Coenzyme Q10 (CO Q-10) 100 MG CAPS Take 1 capsule by mouth daily.     FIBER COMPLETE PO Take 1 tablet by mouth daily.     hydrochlorothiazide (HYDRODIURIL) 25 MG tablet TAKE 1 TABLET (25 MG TOTAL) BY MOUTH DAILY. 90 tablet 0   losartan (COZAAR) 100 MG tablet Take 1 tablet (100 mg total) by mouth daily. 90 tablet 3   metoprolol succinate (TOPROL-XL) 25 MG 24 hr tablet TAKE 1 TABLET (25 MG TOTAL) BY MOUTH DAILY. 90 tablet 0   montelukast (SINGULAIR) 10 MG tablet TAKE 1 TABLET BY MOUTH EVERY DAY 90 tablet 3   pantoprazole  (PROTONIX) 40 MG tablet TAKE 1 TABLET BY MOUTH TWICE A DAY 180 tablet 0   Respiratory Therapy Supplies (FLUTTER) DEVI Use as directed 1 each 0   rosuvastatin (CRESTOR) 20 MG tablet TAKE 1 TABLET BY MOUTH EVERY DAY **D/C LIPITOR** 90 tablet 3   mometasone-formoterol (DULERA) 100-5 MCG/ACT AERO Take 2 puffs first thing in am and then another 2 puffs about 12 hours later. 1 each 11   No facility-administered medications prior to visit.   Review of Systems  Review of Systems  Constitutional: Negative.   HENT:  Positive for congestion. Negative for postnasal drip, sinus pressure and sinus pain.   Respiratory:  Positive for cough and wheezing. Negative for chest tightness and shortness of breath.   Cardiovascular: Negative.    Physical Exam  BP 118/68 (BP Location: Left Arm, Patient Position: Sitting, Cuff Size: Normal)   Pulse 65   Ht 5\' 9"  (1.753 m)   Wt 242 lb (109.8 kg)   SpO2 95%   BMI 35.74 kg/m  Physical Exam Constitutional:      General: He is not in acute distress.    Appearance: Normal appearance. He is not ill-appearing.  HENT:     Head: Normocephalic and atraumatic.     Right Ear: Tympanic membrane normal.     Left Ear: Tympanic membrane normal.     Mouth/Throat:     Mouth: Mucous membranes are moist.     Pharynx: Oropharynx is clear.  Cardiovascular:     Rate and Rhythm: Normal rate and regular rhythm.  Pulmonary:     Effort: Pulmonary effort is normal. No respiratory distress.     Breath sounds: Wheezing present. No rhonchi or rales.  Neurological:     General: No focal deficit present.     Mental Status: He is alert and oriented to person, place, and time. Mental status is at baseline.  Psychiatric:        Mood and Affect: Mood normal.        Behavior: Behavior normal.        Thought Content: Thought content normal.        Judgment: Judgment normal.      Lab Results:  CBC    Component Value Date/Time   WBC 8.0 03/24/2023 1633   RBC 4.53 03/24/2023  1633   HGB 14.6 03/24/2023 1633   HCT 43.3 03/24/2023 1633   PLT 248 03/24/2023 1633   MCV 95.6 03/24/2023 1633   MCH 32.2 03/24/2023 1633   MCHC 33.7 03/24/2023 1633   RDW 11.9 03/24/2023 1633   LYMPHSABS 1,888 03/24/2023 1633   MONOABS 1.3 (H) 09/16/2019 0341   EOSABS 304 03/24/2023 1633   BASOSABS 48 03/24/2023 1633    BMET    Component Value Date/Time   NA 139 12/06/2022 1452   K 3.9 12/06/2022 1452  CL 105 12/06/2022 1452   CO2 27 12/06/2022 1452   GLUCOSE 107 (H) 12/06/2022 1452   BUN 19 12/06/2022 1452   CREATININE 1.19 12/06/2022 1452   CALCIUM 9.3 12/06/2022 1452   GFRNONAA 72 03/26/2021 0855   GFRAA 83 03/26/2021 0855    BNP    Component Value Date/Time   BNP 38 06/12/2018 1455    ProBNP No results found for: "PROBNP"  Imaging: CT MAXILLOFACIAL WO CONTRAST  Result Date: 05/14/2023 CLINICAL DATA:  Bronchiectasis without complication. Cough. EXAM: CT MAXILLOFACIAL WITHOUT CONTRAST TECHNIQUE: Multidetector CT imaging of the maxillofacial structures was performed. Multiplanar CT image reconstructions were also generated. RADIATION DOSE REDUCTION: This exam was performed according to the departmental dose-optimization program which includes automated exposure control, adjustment of the mA and/or kV according to patient size and/or use of iterative reconstruction technique. COMPARISON:  Face CT 07/18/2018 FINDINGS: Osseous: No fracture or mandibular dislocation. No destructive process. Orbits: Negative. No traumatic or inflammatory finding. Sinuses: Circumferential mucosal thickening of both maxillary sinuses has increased from prior exam. There is a trace fluid level on the left. The right ostiomeatal complex appears occluded. Mucosal thickening throughout the ethmoid air cells is similar. Mucosal thickening in the right frontal sinus has improved. Right frontal sinus ostium appears occluded. The left frontal sinus is clear. Minor mucosal thickening in left side of  sphenoid sinus. No mastoid effusion similar leftward nasal septal deviation. Soft tissues: Negative. Limited intracranial: No significant or unexpected finding. IMPRESSION: Chronic paranasal sinus disease as described. There is progressive mucosal thickening both maxillary sinuses from 2019 exam. 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 Electronically Signed   By: Narda Rutherford M.D.   On: 05/14/2023 22:43     Assessment & Plan:   Chronic maxillary sinusitis - CT sinuses showed chronic paranasal sinus disease as described. There is progressive mucosal thickening both maxillary sinuses from 2019 exam. 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 He did not notice much difference while taking Dulera and inhaler cost him >100 dollars. He is requiring Albuterol rescue inhaler 3-4 times a day. Asthma symptoms were the most controlled on Symbicort . He tried and failed Advair, Dulera and Breztri. Resume Symbicort two puffs morning and evening (RX printed, sent message to our pharmacist team about initiating PA for medication). If this is not approved, would consider increasing Dulera to 1-2 puffs twice daily (13 gram)   Hiatal hernia - Continue pantoprazole 40mg  twice daily for reflux and chronic cough   40 mins spent on case; > 50% face to face with patient   Glenford Bayley, NP 05/18/2023

## 2023-05-18 NOTE — Progress Notes (Signed)
Spoke with pt and notified of results per Dr. Wert. Pt verbalized understanding and denied any questions. 

## 2023-05-18 NOTE — Assessment & Plan Note (Signed)
-   Continue pantoprazole 40mg  twice daily for reflux and chronic cough

## 2023-05-19 ENCOUNTER — Other Ambulatory Visit (HOSPITAL_COMMUNITY): Payer: Self-pay

## 2023-05-22 ENCOUNTER — Other Ambulatory Visit (HOSPITAL_COMMUNITY): Payer: Self-pay

## 2023-05-22 ENCOUNTER — Telehealth: Payer: Self-pay

## 2023-05-22 NOTE — Telephone Encounter (Signed)
PA has been APPROVED from 05/22/2023-05/21/2024 

## 2023-05-22 NOTE — Telephone Encounter (Signed)
PA submitted and pending determination, will updated in additional encounter created.

## 2023-05-22 NOTE — Telephone Encounter (Signed)
*  Pulm  PA request received for Budesonide-Formoterol Fumarate 160-4.5MCG/ACT aerosol  PA submitted to [facility] via CMM and is pending determination.  *tried and failed Advair, dulera and breztri   Key: [id number]

## 2023-06-01 ENCOUNTER — Other Ambulatory Visit: Payer: Self-pay

## 2023-06-01 NOTE — Telephone Encounter (Signed)
Requested medication (s) are due for refill today: yes   Requested medication (s) are on the active medication list: yes   Last refill:  01/12/23 #90 0 refills  Future visit scheduled: no   Notes to clinic:  last OV 03/24/23. No refills remain do you want to refill Rx?     Requested Prescriptions  Pending Prescriptions Disp Refills   metoprolol succinate (TOPROL-XL) 25 MG 24 hr tablet 90 tablet 0    Sig: Take 1 tablet (25 mg total) by mouth daily.     Cardiovascular:  Beta Blockers Failed - 06/01/2023 12:01 PM      Failed - Valid encounter within last 6 months    Recent Outpatient Visits           2 years ago Pure hypercholesterolemia   [facility] [place], Priscille Heidelberg, MD   3 years ago Dysuria   [facility] [place], Velna Hatchet, MD   4 years ago Coronary artery calcification seen on CAT scan   [facility] Donita Brooks, MD   4 years ago Community acquired pneumonia of left lower lobe of lung (HCC)   [facility] Danelle Berry, PA-C   5 years ago Dyspnea, unspecified type   [facility] [place], Priscille Heidelberg, MD              Passed - Last BP in normal range    BP Readings from Last 1 Encounters:  05/17/23 118/68         Passed - Last Heart Rate in normal range    Pulse Readings from Last 1 Encounters:  05/17/23 65

## 2023-06-01 NOTE — Telephone Encounter (Signed)
Prescription Request  06/01/2023  LOV: 03/24/23  What is the name of the medication or equipment? metoprolol succinate (TOPROL-XL) 25 MG 24 hr tablet [[id number]]  Have you contacted your pharmacy to request a refill? No   Which pharmacy would you like this sent to?  [id number] [place], [place] - [address] [place] [address] Phone: [phone] Fax: [phone]    Patient notified that their request is being sent to the clinical staff for review and that they should receive a response within 2 business days.   Please advise at [facility] [phone]

## 2023-06-05 MED ORDER — METOPROLOL SUCCINATE ER 25 MG PO TB24: 25.0000 mg | ORAL_TABLET | Freq: Every day | ORAL | 1 refills | Status: DC

## 2023-07-10 ENCOUNTER — Other Ambulatory Visit: Payer: Self-pay | Admitting: Family Medicine

## 2023-07-11 ENCOUNTER — Other Ambulatory Visit: Payer: Self-pay | Admitting: Family Medicine

## 2023-07-18 ENCOUNTER — Other Ambulatory Visit: Payer: Self-pay | Admitting: Family Medicine

## 2023-07-31 DIAGNOSIS — H9193 Unspecified hearing loss, bilateral: Secondary | ICD-10-CM | POA: Diagnosis not present

## 2023-07-31 DIAGNOSIS — J324 Chronic pansinusitis: Secondary | ICD-10-CM | POA: Diagnosis not present

## 2023-07-31 DIAGNOSIS — R053 Chronic cough: Secondary | ICD-10-CM | POA: Diagnosis not present

## 2023-08-01 DIAGNOSIS — H903 Sensorineural hearing loss, bilateral: Secondary | ICD-10-CM | POA: Diagnosis not present

## 2023-08-02 NOTE — Telephone Encounter (Signed)
Prescription Request  08/02/2023  LOV: 03/24/2023  What is the name of the medication or equipment?   losartan (COZAAR) 100 MG tablet **urgent refill request from [facility]. Contact number for provider services is [phone]**  Have you contacted your pharmacy to request a refill? Yes   Which pharmacy would you like this sent to?  [id number] [place], [place] - [address] [place] [address] Phone: [phone] Fax: [phone]    Patient notified that their request is being sent to the clinical staff for review and that they should receive a response within 2 business days.   Please advise

## 2023-08-10 ENCOUNTER — Other Ambulatory Visit: Payer: Self-pay | Admitting: Physician Assistant

## 2023-08-10 DIAGNOSIS — J324 Chronic pansinusitis: Secondary | ICD-10-CM

## 2023-08-14 ENCOUNTER — Ambulatory Visit
Admission: RE | Admit: 2023-08-14 | Discharge: 2023-08-14 | Disposition: A | Discharge status: Home / Self Care | Payer: No Typology Code available for payment source | Source: Ambulatory Visit | Attending: Physician Assistant | Admitting: Physician Assistant

## 2023-08-14 DIAGNOSIS — J324 Chronic pansinusitis: Secondary | ICD-10-CM | POA: Diagnosis not present

## 2023-09-04 ENCOUNTER — Other Ambulatory Visit: Payer: Self-pay

## 2023-09-04 ENCOUNTER — Telehealth: Payer: Self-pay | Admitting: Family Medicine

## 2023-09-04 DIAGNOSIS — I10 Essential (primary) hypertension: Secondary | ICD-10-CM

## 2023-09-04 MED ORDER — LOSARTAN POTASSIUM 100 MG PO TABS
100.0000 mg | ORAL_TABLET | Freq: Every day | ORAL | 0 refills | Status: DC
Start: 2023-09-04 — End: 2023-09-11

## 2023-09-04 NOTE — Telephone Encounter (Signed)
Prescription Request  09/04/2023  LOV: 03/24/2023  What is the name of the medication or equipment?   losartan (COZAAR) 100 MG tablet  **[facility] sent urgent fax to request 100-day script**  Have you contacted your pharmacy to request a refill? Yes   Which pharmacy would you like this sent to?  [id number] [place], [place] - [address] [place] [address] Phone: [phone] Fax: [phone]    Patient notified that their request is being sent to the clinical staff for review and that they should receive a response within 2 business days.   **Please advise [facility] Provider services at [phone].**

## 2023-09-08 DIAGNOSIS — I129 Hypertensive chronic kidney disease with stage 1 through stage 4 chronic kidney disease, or unspecified chronic kidney disease: Secondary | ICD-10-CM | POA: Diagnosis not present

## 2023-09-08 DIAGNOSIS — Z6834 Body mass index (BMI) 34.0-34.9, adult: Secondary | ICD-10-CM | POA: Diagnosis not present

## 2023-09-08 DIAGNOSIS — D6869 Other thrombophilia: Secondary | ICD-10-CM | POA: Diagnosis not present

## 2023-09-08 DIAGNOSIS — E785 Hyperlipidemia, unspecified: Secondary | ICD-10-CM | POA: Diagnosis not present

## 2023-09-08 DIAGNOSIS — Z008 Encounter for other general examination: Secondary | ICD-10-CM | POA: Diagnosis not present

## 2023-09-08 DIAGNOSIS — R2681 Unsteadiness on feet: Secondary | ICD-10-CM | POA: Diagnosis not present

## 2023-09-08 DIAGNOSIS — H1131 Conjunctival hemorrhage, right eye: Secondary | ICD-10-CM | POA: Diagnosis not present

## 2023-09-08 DIAGNOSIS — K219 Gastro-esophageal reflux disease without esophagitis: Secondary | ICD-10-CM | POA: Diagnosis not present

## 2023-09-08 DIAGNOSIS — N182 Chronic kidney disease, stage 2 (mild): Secondary | ICD-10-CM | POA: Diagnosis not present

## 2023-09-08 DIAGNOSIS — I4891 Unspecified atrial fibrillation: Secondary | ICD-10-CM | POA: Diagnosis not present

## 2023-09-08 DIAGNOSIS — E669 Obesity, unspecified: Secondary | ICD-10-CM | POA: Diagnosis not present

## 2023-09-08 DIAGNOSIS — I700 Atherosclerosis of aorta: Secondary | ICD-10-CM | POA: Diagnosis not present

## 2023-09-08 DIAGNOSIS — Z7901 Long term (current) use of anticoagulants: Secondary | ICD-10-CM | POA: Diagnosis not present

## 2023-09-11 ENCOUNTER — Ambulatory Visit (INDEPENDENT_AMBULATORY_CARE_PROVIDER_SITE_OTHER): Payer: No Typology Code available for payment source | Admitting: Family Medicine

## 2023-09-11 VITALS — BP 120/68 | HR 108 | Temp 98.3°F | Ht 69.0 in | Wt 238.2 lb

## 2023-09-11 DIAGNOSIS — I10 Essential (primary) hypertension: Secondary | ICD-10-CM | POA: Diagnosis not present

## 2023-09-11 DIAGNOSIS — J4489 Other specified chronic obstructive pulmonary disease: Secondary | ICD-10-CM

## 2023-09-11 DIAGNOSIS — K219 Gastro-esophageal reflux disease without esophagitis: Secondary | ICD-10-CM | POA: Diagnosis not present

## 2023-09-11 DIAGNOSIS — R0602 Shortness of breath: Secondary | ICD-10-CM

## 2023-09-11 DIAGNOSIS — E782 Mixed hyperlipidemia: Secondary | ICD-10-CM | POA: Diagnosis not present

## 2023-09-11 MED ORDER — LOSARTAN POTASSIUM 100 MG PO TABS
100.0000 mg | ORAL_TABLET | Freq: Every day | ORAL | 2 refills | Status: DC
Start: 2023-09-11 — End: 2024-01-19

## 2023-09-11 MED ORDER — METOPROLOL SUCCINATE ER 25 MG PO TB24: 25.0000 mg | ORAL_TABLET | Freq: Every day | ORAL | 2 refills | Status: DC

## 2023-09-11 MED ORDER — ROSUVASTATIN CALCIUM 20 MG PO TABS
ORAL_TABLET | ORAL | 2 refills | Status: DC
Start: 2023-09-11 — End: 2023-09-29

## 2023-09-11 MED ORDER — PANTOPRAZOLE SODIUM 40 MG PO TBEC: 40.0000 mg | DELAYED_RELEASE_TABLET | Freq: Two times a day (BID) | ORAL | 2 refills | Status: DC

## 2023-09-11 MED ORDER — HYDROCHLOROTHIAZIDE 25 MG PO TABS: 25.0000 mg | ORAL_TABLET | Freq: Every day | ORAL | 2 refills | Status: DC

## 2023-09-11 MED ORDER — MONTELUKAST SODIUM 10 MG PO TABS
10.0000 mg | ORAL_TABLET | Freq: Every day | ORAL | 2 refills | Status: AC
Start: 2023-09-11 — End: ?

## 2023-09-11 NOTE — Progress Notes (Signed)
Subjective:    Patient ID: Mario [name], male    DOB: 06/29/1946, 77 y.o.   MRN: [id number] Patient has a history of COPD as well as bronchiectasis.  Patient also has a history of atrial fibrillation.  Recently he has been dealing with more shortness of breath with activity.  He has also noticed swelling in his legs.  He has +1 pitting edema from his feet to his knees.  He has chronic venous insufficiency with numerous spider veins however this is more than previously seen.  His last echocardiogram was in 2016 but at that time his ejection fraction was normal.  He denies any chest pain.  He denies any orthopnea. Past Medical History:  Diagnosis Date   Asthma    Biliary colic 2014   COPD (chronic obstructive pulmonary disease) with chronic bronchitis    Coronary artery calcification seen on CAT scan    Cyst of pancreas    1 cm (7/18), repeat ct in 12 months.     Hyperlipidemia    Hypertension    Obstructive sleep apnea    Renal disorder    Rhinitis    Sleep apnea    pt denies sleep apnea-lost weight   Past Surgical History:  Procedure Laterality Date   COLONOSCOPY  09/29/2014   LAPAROSCOPIC CHOLECYSTECTOMY SINGLE SITE WITH INTRAOPERATIVE CHOLANGIOGRAM N/A 09/15/2019   Procedure: LAPAROSCOPIC CHOLECYSTECTOMY SINGLE SITE;  Surgeon: Karie Soda, MD;  Location: [facility];  Service: General;  Laterality: N/A;   LITHOTRIPSY  2000   for kidney stone   Current Outpatient Medications on File Prior to Visit  Medication Sig Dispense Refill   albuterol (VENTOLIN HFA) 108 (90 Base) MCG/ACT inhaler INHALE 2 PUFFS INTO THE LUNGS EVERY 4 HOURS AS NEEDED FOR WHEEZING OR SHORTNESS OF BREATH. 8.5 each 2   apixaban (ELIQUIS) 5 MG TABS tablet Take 1 tablet (5 mg total) by mouth 2 (two) times daily. 60 tablet 5   Ascorbic Acid (VITAMIN C) 1000 MG tablet Take 1,000 mg by mouth 2 (two) times daily.     budesonide-formoterol (SYMBICORT) 160-4.5 MCG/ACT inhaler Inhale 2 puffs into the lungs in the morning  and at bedtime. Generic medication 1 each 6   Cholecalciferol (VITAMIN D3) 125 MCG (5000 UT) TABS Take by mouth.     ciprofloxacin (CIPRO) 500 MG tablet Take 1 tablet (500 mg total) by mouth 2 (two) times daily. 20 tablet 11   Coenzyme Q10 (CO Q-10) 100 MG CAPS Take 1 capsule by mouth daily.     FIBER COMPLETE PO Take 1 tablet by mouth daily.     Respiratory Therapy Supplies (FLUTTER) DEVI Use as directed 1 each 0   No current facility-administered medications on file prior to visit.   No Known Allergies Social History   Socioeconomic History   Marital status: Married    Spouse name: Not on file   Number of children: Not on file   Years of education: Not on file   Highest education level: Bachelor's degree (e.g., BA, AB, BS)  Occupational History   Not on file  Tobacco Use   Smoking status: Former    Current packs/day: 0.00    Average packs/day: 2.0 packs/day for 11.0 years (22.0 ttl pk-yrs)    Types: Cigarettes    Start date: 12/20/1963    Quit date: 12/19/1974    Years since quitting: 48.7   Smokeless tobacco: Never  Vaping Use   Vaping status: Never Used  Substance and Sexual Activity  Alcohol use: Not Currently    Alcohol/week: 1.0 standard drink of alcohol    Types: 1 Glasses of wine per week   Drug use: No   Sexual activity: Not on file  Other Topics Concern   Not on file  Social History Narrative   Not on file   Social Determinants of Health   Financial Resource Strain: Medium Risk (03/22/2023)   Overall Financial Resource Strain (CARDIA)    Difficulty of Paying Living Expenses: Somewhat hard  Food Insecurity: Low Risk  (07/31/2023)   Received from [facility]   Hunger Vital Sign    Worried About Running Out of Food in the Last Year: Never true    Ran Out of Food in the Last Year: Never true  Transportation Needs: Not on file (07/31/2023)  Physical Activity: Insufficiently Active (03/22/2023)   Exercise Vital Sign    Days of Exercise per Week: 4 days    Minutes  of Exercise per Session: 20 min  Stress: No Stress Concern Present (03/22/2023)   [employer] of Occupational Health - Occupational Stress Questionnaire    Feeling of Stress : Not at all  Social Connections: Socially Integrated (03/22/2023)   Social Connection and Isolation Panel [NHANES]    Frequency of Communication with Friends and Family: More than three times a week    Frequency of Social Gatherings with Friends and Family: Twice a week    Attends Religious Services: More than 4 times per year    Active Member of [employer] or Organizations: Yes    Attends Banker Meetings: More than 4 times per year    Marital Status: Married  Catering manager Violence: Not At Risk (11/23/2022)   Humiliation, Afraid, Rape, and Kick questionnaire    Fear of Current or Ex-Partner: No    Emotionally Abused: No    Physically Abused: No    Sexually Abused: No      Review of Systems  Respiratory:  Positive for cough.   All other systems reviewed and are negative.      Objective:   Physical Exam Vitals reviewed.  Constitutional:      Appearance: He is well-developed.  HENT:     Head: Normocephalic and atraumatic.     Nose: Nose normal.     Mouth/Throat:     Pharynx: No oropharyngeal exudate.  Neck:     Vascular: No carotid bruit.  Cardiovascular:     Rate and Rhythm: Normal rate and regular rhythm.     Heart sounds: Normal heart sounds.  Pulmonary:     Effort: Pulmonary effort is normal. No respiratory distress.     Breath sounds: No stridor or decreased air movement. Rhonchi and rales present. No wheezing.    Musculoskeletal:     Right lower leg: Edema present.     Left lower leg: Edema present.           Assessment & Plan:  COPD (chronic obstructive pulmonary disease) with chronic bronchitis - Plan: montelukast (SINGULAIR) 10 MG tablet  Essential hypertension - Plan: losartan (COZAAR) 100 MG tablet, hydrochlorothiazide (HYDRODIURIL) 25 MG tablet, metoprolol  succinate (TOPROL-XL) 25 MG 24 hr tablet  Gastric reflux - Plan: pantoprazole (PROTONIX) 40 MG tablet  Mixed hyperlipidemia - Plan: rosuvastatin (CRESTOR) 20 MG tablet  Shortness of breath - Plan: ECHOCARDIOGRAM COMPLETE, Brain natriuretic peptide, CBC with Differential/Platelet, COMPLETE METABOLIC PANEL WITH GFR  I am concerned about his shortness of breath and his leg swelling.  I recommended a BNP to  evaluate for evidence of heart failure.  I would also recommend an echocardiogram to evaluate for diastolic dysfunction or systolic failure given his longstanding history of atrial fibrillation and underlying lung disease.  If labs and echocardiogram are normal, I suspect that the patient's leg swelling is likely due to his venous insufficiency and I will treat this with compression hose.

## 2023-09-12 LAB — COMPLETE METABOLIC PANEL WITH GFR
AG Ratio: 1.7 (calc) (ref 1.0–2.5)
ALT: 21 U/L (ref 9–46)
AST: 23 U/L (ref 10–35)
Albumin: 4 g/dL (ref 3.6–5.1)
Alkaline phosphatase (APISO): 31 U/L — ABNORMAL LOW (ref 35–144)
BUN: 23 mg/dL (ref 7–25)
CO2: 26 mmol/L (ref 20–32)
Calcium: 9.5 mg/dL (ref 8.6–10.3)
Chloride: 103 mmol/L (ref 98–110)
Creat: 1.15 mg/dL (ref 0.70–1.28)
Globulin: 2.4 g/dL (calc) (ref 1.9–3.7)
Glucose, Bld: 94 mg/dL (ref 65–99)
Potassium: 4.3 mmol/L (ref 3.5–5.3)
Sodium: 140 mmol/L (ref 135–146)
Total Bilirubin: 0.8 mg/dL (ref 0.2–1.2)
Total Protein: 6.4 g/dL (ref 6.1–8.1)
eGFR: 65 mL/min/{1.73_m2} (ref >=60)

## 2023-09-12 LAB — CBC WITH DIFFERENTIAL/PLATELET
Absolute Monocytes: 902 cells/uL (ref 200–950)
Basophils Absolute: 47 cells/uL (ref 0–200)
Basophils Relative: 0.5 %
Eosinophils Absolute: 56 cells/uL (ref 15–500)
Eosinophils Relative: 0.6 %
HCT: 43 % (ref 38.5–50.0)
Hemoglobin: 14.2 g/dL (ref 13.2–17.1)
Lymphs Abs: 2130 cells/uL (ref 850–3900)
MCH: 31.6 pg (ref 27.0–33.0)
MCHC: 33 g/dL (ref 32.0–36.0)
MCV: 95.6 fL (ref 80.0–100.0)
MPV: 9.8 fL (ref 7.5–12.5)
Monocytes Relative: 9.7 %
Neutro Abs: 6166 cells/uL (ref 1500–7800)
Neutrophils Relative %: 66.3 %
Platelets: 299 10*3/uL (ref 140–400)
RBC: 4.5 10*6/uL (ref 4.20–5.80)
RDW: 12.5 % (ref 11.0–15.0)
Total Lymphocyte: 22.9 %
WBC: 9.3 10*3/uL (ref 3.8–10.8)

## 2023-09-12 LAB — BRAIN NATRIURETIC PEPTIDE: Brain Natriuretic Peptide: 350 pg/mL — ABNORMAL HIGH (ref <100)

## 2023-09-20 ENCOUNTER — Telehealth: Payer: Self-pay

## 2023-09-20 ENCOUNTER — Telehealth: Payer: Self-pay | Admitting: Family Medicine

## 2023-09-20 NOTE — Telephone Encounter (Signed)
Patient called to follow up on referral/appointment for echo; stated he spoke with the CMA about it.  Patient hasn't received a call yet.   Please advise at [phone].

## 2023-09-20 NOTE — Telephone Encounter (Signed)
Mario [name].   Can you update on this order pls ECHOCARDIOGRAM COMPLETE (Order [id number]) Pt has not heard anything yet.   Thank you.

## 2023-09-29 ENCOUNTER — Other Ambulatory Visit: Payer: Self-pay

## 2023-09-29 DIAGNOSIS — E782 Mixed hyperlipidemia: Secondary | ICD-10-CM

## 2023-09-29 NOTE — Telephone Encounter (Signed)
Unable to find pharmacy electronically,  ([facility]) prescription may need to be faxed to pharmacy. Routing for review.

## 2023-09-29 NOTE — Telephone Encounter (Signed)
Prescription Request  09/29/2023  LOV: 09/11/23  What is the name of the medication or equipment? rosuvastatin (CRESTOR) 20 MG tablet [[id number]]  Have you contacted your pharmacy to request a refill? Yes   Which pharmacy would you like this sent to?  [facility] FAX [phone]  Patient notified that their request is being sent to the clinical staff for review and that they should receive a response within 2 business days.   Please advise at [facility] [phone]

## 2023-10-03 NOTE — Telephone Encounter (Signed)
Requested medication (s) are due for refill today - no  Requested medication (s) are on the active medication list -yes  Future visit scheduled -no  Last refill: 09/11/23 #90 2RF  Notes to clinic: original Rx [facility]- so it should be on file with them- there is request to send to [facility] form patient- sent for review of request- may have to have Rx faxed- not finding [facility] list under name or number in pharmacy search  Requested Prescriptions  Pending Prescriptions Disp Refills   rosuvastatin (CRESTOR) 20 MG tablet 90 tablet 2    Sig: TAKE 1 TABLET BY MOUTH EVERY DAY **D/C LIPITOR**     Cardiovascular:  Antilipid - Statins 2 Failed - 10/03/2023 12:33 PM      Failed - Valid encounter within last 12 months    Recent Outpatient Visits           2 years ago Pure hypercholesterolemia   [facility] [place], Priscille Heidelberg, MD   3 years ago Dysuria   [facility] [place], Velna Hatchet, MD   4 years ago Coronary artery calcification seen on CAT scan   [facility] Donita Brooks, MD   5 years ago Community acquired pneumonia of left lower lobe of lung (HCC)   [facility] Danelle Berry, PA-C   5 years ago Dyspnea, unspecified type   [facility] [place], Priscille Heidelberg, MD       Future Appointments             In 3 months Allyson Sabal Delton See, MD [facility]            Failed - Lipid Panel in normal range within the last 12 months    Cholesterol  Date Value Ref Range Status  03/26/2021 133 <200 mg/dL Final   LDL Cholesterol (Calc)  Date Value Ref Range Status  03/26/2021 57 mg/dL (calc) Final    Comment:    Reference range: <100 . Desirable range <100 mg/dL for primary prevention;   <70 mg/dL for patients with CHD or diabetic patients  with > or = 2 CHD risk factors. Marland Kitchen LDL-C is now calculated using the Martin-Hopkins  calculation, which is a validated novel  method providing  better accuracy than the Friedewald equation in the  estimation of LDL-C.  Horald Pollen et al. Lenox Ahr. 6213;086(57): 2061-2068  (http://education.[facility].com/faq/FAQ164)    HDL  Date Value Ref Range Status  03/26/2021 60 > OR = 40 mg/dL Final   Triglycerides  Date Value Ref Range Status  03/26/2021 78 <150 mg/dL Final         Passed - Cr in normal range and within 360 days    Creat  Date Value Ref Range Status  09/11/2023 1.15 0.70 - 1.28 mg/dL Final         Passed - Patient is not pregnant         Requested Prescriptions  Pending Prescriptions Disp Refills   rosuvastatin (CRESTOR) 20 MG tablet 90 tablet 2    Sig: TAKE 1 TABLET BY MOUTH EVERY DAY **D/C LIPITOR**     Cardiovascular:  Antilipid - Statins 2 Failed - 10/03/2023 12:33 PM      Failed - Valid encounter within last 12 months    Recent Outpatient Visits           2 years ago Pure hypercholesterolemia   [facility] [place], Priscille Heidelberg,  MD   3 years ago Dysuria   [facility] [place], Velna Hatchet, MD   4 years ago Coronary artery calcification seen on CAT scan   [facility] Donita Brooks, MD   5 years ago Community acquired pneumonia of left lower lobe of lung (HCC)   [facility] Danelle Berry, PA-C   5 years ago Dyspnea, unspecified type   [facility] [place], Priscille Heidelberg, MD       Future Appointments             In 3 months Allyson Sabal Delton See, MD [facility]            Failed - Lipid Panel in normal range within the last 12 months    Cholesterol  Date Value Ref Range Status  03/26/2021 133 <200 mg/dL Final   LDL Cholesterol (Calc)  Date Value Ref Range Status  03/26/2021 57 mg/dL (calc) Final    Comment:    Reference range: <100 . Desirable range <100 mg/dL for primary prevention;   <70 mg/dL for patients with CHD or diabetic patients  with > or = 2 CHD  risk factors. Marland Kitchen LDL-C is now calculated using the Martin-Hopkins  calculation, which is a validated novel method providing  better accuracy than the Friedewald equation in the  estimation of LDL-C.  Horald Pollen et al. Lenox Ahr. 3086;578(46): 2061-2068  (http://education.[facility].com/faq/FAQ164)    HDL  Date Value Ref Range Status  03/26/2021 60 > OR = 40 mg/dL Final   Triglycerides  Date Value Ref Range Status  03/26/2021 78 <150 mg/dL Final         Passed - Cr in normal range and within 360 days    Creat  Date Value Ref Range Status  09/11/2023 1.15 0.70 - 1.28 mg/dL Final         Passed - Patient is not pregnant

## 2023-10-03 NOTE — Telephone Encounter (Signed)
Pharmacy sent urgent fax to follow up on refill requested for rosuvastatin (CRESTOR) 20 MG tablet   Pharmacy:  [facility] Phone: [phone] (provider services) Fax: [phone]  Please advise pharmacist.

## 2023-10-04 DIAGNOSIS — H524 Presbyopia: Secondary | ICD-10-CM | POA: Diagnosis not present

## 2023-10-04 DIAGNOSIS — H25811 Combined forms of age-related cataract, right eye: Secondary | ICD-10-CM | POA: Diagnosis not present

## 2023-10-04 DIAGNOSIS — H2521 Age-related cataract, morgagnian type, right eye: Secondary | ICD-10-CM | POA: Diagnosis not present

## 2023-10-04 DIAGNOSIS — H52223 Regular astigmatism, bilateral: Secondary | ICD-10-CM | POA: Diagnosis not present

## 2023-10-04 DIAGNOSIS — H5203 Hypermetropia, bilateral: Secondary | ICD-10-CM | POA: Diagnosis not present

## 2023-10-04 DIAGNOSIS — H01024 Squamous blepharitis left upper eyelid: Secondary | ICD-10-CM | POA: Diagnosis not present

## 2023-10-04 DIAGNOSIS — H04123 Dry eye syndrome of bilateral lacrimal glands: Secondary | ICD-10-CM | POA: Diagnosis not present

## 2023-10-16 ENCOUNTER — Telehealth: Payer: Self-pay | Admitting: Primary Care

## 2023-10-16 ENCOUNTER — Ambulatory Visit (INDEPENDENT_AMBULATORY_CARE_PROVIDER_SITE_OTHER): Payer: No Typology Code available for payment source | Admitting: Primary Care

## 2023-10-16 ENCOUNTER — Other Ambulatory Visit: Payer: Self-pay | Admitting: Family Medicine

## 2023-10-16 VITALS — BP 93/64 | HR 107 | Ht 69.0 in | Wt 236.8 lb

## 2023-10-16 DIAGNOSIS — J4540 Moderate persistent asthma, uncomplicated: Secondary | ICD-10-CM

## 2023-10-16 DIAGNOSIS — J479 Bronchiectasis, uncomplicated: Secondary | ICD-10-CM | POA: Diagnosis not present

## 2023-10-16 DIAGNOSIS — J320 Chronic maxillary sinusitis: Secondary | ICD-10-CM | POA: Diagnosis not present

## 2023-10-16 DIAGNOSIS — K449 Diaphragmatic hernia without obstruction or gangrene: Secondary | ICD-10-CM

## 2023-10-16 DIAGNOSIS — R0602 Shortness of breath: Secondary | ICD-10-CM

## 2023-10-16 NOTE — Assessment & Plan Note (Signed)
-   No acute issues - Continue fluticasone nasal spray and prn saline nasal rinses

## 2023-10-16 NOTE — Assessment & Plan Note (Addendum)
-   HRCT imaging in 2019 showed mild bronchiectasis. He has a chronic cough which is occasionally productive. He reports increased chest congestion and dyspnea, he just finished a course of ciprofloxacin which improved his cough. Mucus is not currently purulent. Continue mucinex and flutter valve as needed. Recommend getting updated HRCT imaging of his chest to re-evaluted bronchiectasis.

## 2023-10-16 NOTE — Telephone Encounter (Signed)
Patient has not been contacted to schedule echocardiogram that you ordered, can you have your office follow-up on this

## 2023-10-16 NOTE — Assessment & Plan Note (Addendum)
PFT's  08/13/2018  FEV1 1.85 (63 % ) ratio 62   p symb 160/spiriva  And saba   prior to study with DLCO  75/74c % corrects to 108  % for alv volume    - Experiencing nocturnal wheezing and cough. Associated fatigue and leg swelling. Using SABA more frequently. Prefers Symbicort vs Breztri, using ICS/LABA inhaler twice a day as prescribed. He is currently being worked up for congestive heart failure which is likely contributing to his symptoms. BNP elevated, echocardiogram has been ordered but not scheduled. Recommend getting updated PFTs prior to next visit to re-assess lung function.

## 2023-10-16 NOTE — Assessment & Plan Note (Signed)
-   Continue pantoprazole 40mg  twice daily for reflux and chronic cough

## 2023-10-16 NOTE — Progress Notes (Signed)
@Patient  ID: Mario [name], male    DOB: Jan 19, 1946, 77 y.o.   MRN: [id number]  Chief Complaint  Patient presents with   Follow-up    Pt states he has questions today. Recently diagnosed with congestive heart failure. Pt has been dealing with congestion especially at night. Pt states he is using his inhaler.     Referring provider: Donita Brooks, MD  HPI: 77 year old male, former smoker. PMH significant afib, HTN, CAD, bronchiectasis, asthma, chronic frontal sinusitis, hiatal hernia, OSA. Patient of Dr. Sherene Sires.   PFT's 08/13/2018 FEV1 1.85 (63 % ) ratio 62 p symb 160/spiriva And saba prior to study with DLCO 75/74c % corrects to 108 % for alv volume   05/17/2023 Recently saw Dr. Sherene Sires on 05/01/2023 for bronchiectasis/ cough. Received Depo-Medrol 120 mg IM and given a prescription for ciprofloxacin 500 mg twice daily x 10 days. Advised to take pantoprazole twice daily, mucinex and use flutter valve as much as possible. He was changed from [place] to [facility] 100 two puffs q12 hours. Ordered for CT sinuses.   Presents today for 2 week follow-up. Abx and steroid shot seemed to clear up his chest infection/bronchitis symptoms. He has been wheezing at night which keeps him up. He coughs during the day. Cough is now mainly dry. He was without symptoms when he was on Symbicort, did not require SABA while on high dose ICS/LABA. Needs PA. Tried Advair, breztri and [employer]. Now requiring Albuterol 3-4 times a day due to wheezing and cough symptoms. He is complaint with pantoprazole medication twice daily. He is using mucinex and flutter as directed. CT sinuses showed chronic paranasal sinus disease. He does have sinus congestion. He takes Singulair 10mg  daily. He uses flonase every night, 3-4 times a week he uses saline rinse. Denies sinus pressure or pain.    10/16/2023- interim hx  Followed by our office for Asthma and bronchiectasis. Reports increased cough/chest congestion. He has associated symptoms  including nocturnal wheeze, leg swelling, fatigue and shortness of breath. Cough is productive at times, sputum is currently clear. He just finished a course of ciprofloxacin. He is compliant with Symbicort twice daily. He had previously not needed to use Albuterol rescue inhaler for several years, now requiring multiple times a day. BNP was 350, he was ordered for echocardiogram but this has not been scheduled.   CT chest 09/2018 showed mild cylindrical and varicoid bronchiectasis in the mid to lower lungs, unchanged. Associated mild bronchial wall thickening is slightly improved.   No Known Allergies  Immunization History  Administered Date(s) Administered   Fluad Quad(high Dose 65+) 09/09/2019   Influenza Split 09/09/2013   Influenza, High Dose Seasonal PF 09/22/2015, 09/27/2016, 09/27/2016, 09/11/2017   Influenza,inj,Quad PF,6+ Mos 09/03/2018   Influenza-Unspecified 09/18/2014   PFIZER(Purple Top)SARS-COV-2 Vaccination 02/05/2020, 02/26/2020   Pneumococcal Conjugate-13 02/17/2014   Pneumococcal Polysaccharide-23 10/13/2009, 04/16/2015   Tdap 03/13/2007   Zoster, Live 05/29/2015    Past Medical History:  Diagnosis Date   Asthma    Biliary colic 2014   COPD (chronic obstructive pulmonary disease) with chronic bronchitis (HCC)    Coronary artery calcification seen on CAT scan    Cyst of pancreas    1 cm (7/18), repeat ct in 12 months.     Hyperlipidemia    Hypertension    Obstructive sleep apnea    Renal disorder    Rhinitis    Sleep apnea    pt denies sleep apnea-lost weight    Tobacco History: Social History  Tobacco Use  Smoking Status Former   Current packs/day: 0.00   Average packs/day: 2.0 packs/day for 11.0 years (22.0 ttl pk-yrs)   Types: Cigarettes   Start date: 12/20/1963   Quit date: 12/19/1974   Years since quitting: 48.8  Smokeless Tobacco Never   Counseling given: Not Answered   Outpatient Medications Prior to Visit  Medication Sig Dispense Refill    albuterol (VENTOLIN HFA) 108 (90 Base) MCG/ACT inhaler INHALE 2 PUFFS INTO THE LUNGS EVERY 4 HOURS AS NEEDED FOR WHEEZING OR SHORTNESS OF BREATH. 8.5 each 2   apixaban (ELIQUIS) 5 MG TABS tablet Take 1 tablet (5 mg total) by mouth 2 (two) times daily. 60 tablet 5   Ascorbic Acid (VITAMIN C) 1000 MG tablet Take 1,000 mg by mouth 2 (two) times daily.     budesonide-formoterol (SYMBICORT) 160-4.5 MCG/ACT inhaler Inhale 2 puffs into the lungs in the morning and at bedtime. Generic medication 1 each 6   Cholecalciferol (VITAMIN D3) 125 MCG (5000 UT) TABS Take by mouth.     ciprofloxacin (CIPRO) 500 MG tablet Take 1 tablet (500 mg total) by mouth 2 (two) times daily. 20 tablet 11   Coenzyme Q10 (CO Q-10) 100 MG CAPS Take 1 capsule by mouth daily.     FIBER COMPLETE PO Take 1 tablet by mouth daily.     hydrochlorothiazide (HYDRODIURIL) 25 MG tablet Take 1 tablet (25 mg total) by mouth daily. 90 tablet 2   losartan (COZAAR) 100 MG tablet Take 1 tablet (100 mg total) by mouth daily. 100 tablet 2   metoprolol succinate (TOPROL-XL) 25 MG 24 hr tablet Take 1 tablet (25 mg total) by mouth daily. 90 tablet 2   montelukast (SINGULAIR) 10 MG tablet Take 1 tablet (10 mg total) by mouth daily. 90 tablet 2   pantoprazole (PROTONIX) 40 MG tablet Take 1 tablet (40 mg total) by mouth 2 (two) times daily. 180 tablet 2   Respiratory Therapy Supplies (FLUTTER) DEVI Use as directed 1 each 0   rosuvastatin (CRESTOR) 20 MG tablet TAKE 1 TABLET BY MOUTH EVERY DAY **D/C LIPITOR** 90 tablet 2   No facility-administered medications prior to visit.   Review of Systems  Review of Systems  Constitutional:  Positive for fatigue.  HENT:  Positive for congestion.   Respiratory:  Positive for shortness of breath and wheezing. Negative for cough.    Physical Exam  BP 93/64   Pulse (!) 107   Ht 5\' 9"  (1.753 m)   Wt 236 lb 12.8 oz (107.4 kg)   SpO2 95%   BMI 34.97 kg/m  Physical Exam Constitutional:      General: He is  not in acute distress.    Appearance: Normal appearance. He is not ill-appearing.  HENT:     Head: Normocephalic and atraumatic.  Cardiovascular:     Rate and Rhythm: Normal rate and regular rhythm.  Pulmonary:     Effort: Pulmonary effort is normal.     Breath sounds: Wheezing present.  Musculoskeletal:     Right lower leg: Edema present.     Left lower leg: Edema present.  Neurological:     General: No focal deficit present.     Mental Status: He is alert and oriented to person, place, and time. Mental status is at baseline.  Psychiatric:        Mood and Affect: Mood normal.        Behavior: Behavior normal.        Thought Content: Thought  content normal.        Judgment: Judgment normal.      Lab Results:  CBC    Component Value Date/Time   WBC 9.3 09/11/2023 1622   RBC 4.50 09/11/2023 1622   HGB 14.2 09/11/2023 1622   HCT 43.0 09/11/2023 1622   PLT 299 09/11/2023 1622   MCV 95.6 09/11/2023 1622   MCH 31.6 09/11/2023 1622   MCHC 33.0 09/11/2023 1622   RDW 12.5 09/11/2023 1622   LYMPHSABS 2,130 09/11/2023 1622   MONOABS 1.3 (H) 09/16/2019 0341   EOSABS 56 09/11/2023 1622   BASOSABS 47 09/11/2023 1622    BMET    Component Value Date/Time   NA 140 09/11/2023 1622   K 4.3 09/11/2023 1622   CL 103 09/11/2023 1622   CO2 26 09/11/2023 1622   GLUCOSE 94 09/11/2023 1622   BUN 23 09/11/2023 1622   CREATININE 1.15 09/11/2023 1622   CALCIUM 9.5 09/11/2023 1622   GFRNONAA 72 03/26/2021 0855   GFRAA 83 03/26/2021 0855    BNP    Component Value Date/Time   BNP 350 (H) 09/11/2023 1622    ProBNP No results found for: "PROBNP"  Imaging: No results found.   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 Associated fatigue and leg  swelling. Using SABA more frequently. Prefers Symbicort vs Breztri, using ICS/LABA inhaler twice a day as prescribed. He is currently being worked up for congestive heart failure which is likely contributing to his symptoms. BNP elevated, echocardiogram has been ordered but not scheduled. Recommend getting updated PFTs prior to next visit to re-assess lung function.   Bronchiectasis without complication (HCC) - HRCT imaging in 2019 showed mild bronchiectasis. He has a chronic cough which is occasionally productive. He reports increased chest congestion and dyspnea, he just finished a course of ciprofloxacin which improved his cough. Mucus is not currently purulent. Continue mucinex and flutter valve as needed. Recommend getting updated HRCT imaging of his chest to re-evaluted bronchiectasis.   Chronic maxillary sinusitis - No acute issues - Continue fluticasone nasal spray and prn saline nasal rinses  Hiatal hernia - Continue pantoprazole 40mg  twice daily for reflux and chronic cough    Glenford Bayley, NP 10/16/2023

## 2023-10-16 NOTE — Patient Instructions (Addendum)
I suspect most of your symptoms are coming from underlying heart disease  Recommend awaiting echocardiogram before changing medications. I will follow-up on scheduling this test, depending on these results we may discuss adding a different diuretic/primary care cardiologist will manage this. You may also benefit from cardiopulmonary rehab in the future. Weight loss encouraged.   Recommendations - No medication changes today - Continue Symbicort 2 puffs twice daily and flutter valver  - Use albuterol every 4-6 hours as needed for breakthrough shortness of breath or wheezing - You are at an increased risk for lower respiratory infection due to mild bronchiectasis (scarring) seen on imaging back in 2019. Monitor for fever, increased sputum production or colored mucus, notify office if you develop these. Recommend at a later date getting updated CT imaging of your chest and pulmonary function testing to reassess bronchiectasis.  Follow-up 3-4 months with Dr. Sherene Sires   Heart Failure, Diagnosis  Heart failure is a condition in which the heart has trouble pumping blood. This may mean that the heart cannot pump enough blood out to the body or that the heart does not fill up with enough blood. For some people with heart failure, fluid may back up into the lungs. There may also be swelling (edema) in the lower legs. Heart failure is usually a long-term (chronic) condition. It is important for you to take good care of yourself and follow the treatment plan from your health care provider. Different stages of heart failure have different treatment plans. The stages are: Stage A: At risk for heart failure. Having no symptoms of heart failure, but being at risk for developing heart failure. Stage B: Pre-heart failure. Having no symptoms of heart failure, but having structural changes to the heart that indicate heart failure. Stage C: Symptomatic heart failure. Having symptoms of heart failure in addition to  structural changes to the heart that indicate heart failure. Stage D: Advanced heart failure. Having symptoms that interfere with daily life and frequent hospitalizations related to heart failure. What are the causes? This condition may be caused by: High blood pressure (hypertension). Hypertension causes the heart muscle to work harder than normal. Coronary artery disease, or CAD. CAD is the buildup of cholesterol and fat (plaque) in the arteries of the heart. Heart attack, also called myocardial infarction. This injures the heart muscle, making it hard for the heart to pump blood. Abnormal heart valves. The valves do not open and close properly, forcing the heart to pump harder to keep the blood flowing. Heart muscle disease, inflammation, or infection (cardiomyopathy or myocarditis). This is damage to the heart muscle. It can increase the risk of heart failure. Lung disease. The heart works harder when the lungs are not healthy. What increases the risk? The risk of heart failure increases as a person ages. This condition is also more likely to develop in people who: Are obese. Use tobacco or nicotine products. Abuse alcohol or drugs. Have taken medicines that can damage the heart, such as chemotherapy drugs. Have any of these conditions: Diabetes. Abnormal heart rhythms. Thyroid problems. Low blood counts (anemia). Chronic kidney disease. Have a family history of heart failure. What are the signs or symptoms? Symptoms of this condition include: Shortness of breath with activity, such as when climbing stairs. A cough that does not go away. Swelling of the feet, ankles, legs, or abdomen. Losing or gaining weight for no reason. Trouble breathing when lying flat. Waking from sleep because of the need to sit up and get more  air. Rapid heartbeat. Other symptoms may include: Tiredness (fatigue) and loss of energy. Feeling light-headed, dizzy, or close to fainting. Nausea or loss of  appetite. Waking up more often during the night to urinate (nocturia). Confusion. How is this diagnosed? This condition is diagnosed based on: Your medical history, symptoms, and a physical exam. Blood tests. Diagnostic tests, which may include: Echocardiogram. Electrocardiogram (ECG). Chest X-ray. Exercise stress test. Cardiac MRI. Cardiac catheterization and angiogram. Radionuclide scans. How is this treated? Treatment for this condition is aimed at managing the symptoms of heart failure. Medicines Treatment may include medicines that: Help lower blood pressure by relaxing (dilating) the blood vessels. These medicines are called ACE inhibitors (angiotensin-converting enzyme), ARBs (angiotensin receptor blockers), or vasodilators. Cause the kidneys to remove salt and water from the blood through urination (diuretics). Improve heart muscle strength and prevent the heart from beating too fast (beta blockers). Increase the force of the heartbeat (digoxin). Lower heart rates. Certain diabetes medicines (SGLT-2 inhibitors) may also be used in treatment. Healthy behavior changes Treatment may also include making healthy lifestyle changes, such as: Reaching and staying at a healthy weight. Not using tobacco or nicotine products. Eating heart-healthy foods. Limiting or avoiding alcohol. Stopping the use of illegal drugs. Being physically active. Participating in a cardiac rehabilitation program, which is a treatment program to improve your health and well-being through exercise training, education, and counseling. Other treatments Other treatments may include: Procedures to open blocked arteries or repair damaged valves. Placing a pacemaker to improve heart function (cardiac resynchronization therapy). Placing a device to treat serious abnormal heart rhythms (implantable cardioverter defibrillator, or ICD). Placing a device to improve the pumping ability of the heart (left ventricular  assist device, or LVAD). Receiving a healthy heart from a donor (heart transplant). This is done when other treatments have not helped. Follow these instructions at home: Manage other health conditions as told by your health care provider. These may include hypertension, diabetes, thyroid disease, or abnormal heart rhythms. Get ongoing education and support as needed. Learn as much as you can about heart failure. Keep all follow-up visits. This is important. Where to find more information American Heart Association: www.heart.org Centers for Disease Control and Prevention: FootballExhibition.com.br NIH [employer] on Aging: https://walker.com/ Summary Heart failure is a condition in which the heart has trouble pumping blood. This condition is commonly caused by high blood pressure and other diseases of the heart and lungs. Symptoms of this condition include shortness of breath, tiredness (fatigue), nausea, and swelling of the feet, ankles, legs, or abdomen. Treatments for this condition may include medicines, lifestyle changes, and surgery. Manage other health conditions as told by your health care provider. This information is not intended to replace advice given to you by your health care provider. Make sure you discuss any questions you have with your health care provider. Document Revised: 03/15/2022 Document Reviewed: 06/27/2020 Elsevier Patient Education  2024 [employer].

## 2023-10-16 NOTE — Telephone Encounter (Signed)
Great, thank you!

## 2023-10-20 ENCOUNTER — Ambulatory Visit: Payer: No Typology Code available for payment source | Admitting: Family Medicine

## 2023-10-25 DIAGNOSIS — H52223 Regular astigmatism, bilateral: Secondary | ICD-10-CM | POA: Diagnosis not present

## 2023-10-25 DIAGNOSIS — H524 Presbyopia: Secondary | ICD-10-CM | POA: Diagnosis not present

## 2023-10-31 ENCOUNTER — Ambulatory Visit (HOSPITAL_BASED_OUTPATIENT_CLINIC_OR_DEPARTMENT_OTHER): Payer: No Typology Code available for payment source

## 2023-10-31 DIAGNOSIS — R0602 Shortness of breath: Secondary | ICD-10-CM | POA: Diagnosis not present

## 2023-10-31 LAB — ECHOCARDIOGRAM COMPLETE
Area-P 1/2: 3.99 cm2
S' Lateral: 2.26 cm

## 2023-11-02 ENCOUNTER — Ambulatory Visit: Payer: No Typology Code available for payment source | Admitting: Family Medicine

## 2023-11-02 ENCOUNTER — Encounter: Payer: Self-pay | Admitting: Family Medicine

## 2023-11-02 VITALS — BP 124/72 | HR 100 | Temp 98.1°F | Ht 69.0 in | Wt 238.2 lb

## 2023-11-02 DIAGNOSIS — R931 Abnormal findings on diagnostic imaging of heart and coronary circulation: Secondary | ICD-10-CM

## 2023-11-02 MED ORDER — PREDNISONE 20 MG PO TABS: ORAL_TABLET | ORAL | 0 refills | Status: DC

## 2023-11-02 MED ORDER — FUROSEMIDE 40 MG PO TABS: 40.0000 mg | ORAL_TABLET | Freq: Every day | ORAL | 3 refills | Status: DC

## 2023-11-02 NOTE — Progress Notes (Signed)
Subjective:    Patient ID: Mario [name], male    DOB: 03/14/1946, 77 y.o.   MRN: [id number] 09/11/23 Patient has a history of COPD as well as bronchiectasis.  Patient also has a history of atrial fibrillation.  Recently he has been dealing with more shortness of breath with activity.  He has also noticed swelling in his legs.  He has +1 pitting edema from his feet to his knees.  He has chronic venous insufficiency with numerous spider veins however this is more than previously seen.  His last echocardiogram was in 2016 but at that time his ejection fraction was normal.  He denies any chest pain.  He denies any orthopnea.  At that time, my plan was: I am concerned about his shortness of breath and his leg swelling.  I recommended a BNP to evaluate for evidence of heart failure.  I would also recommend an echocardiogram to evaluate for diastolic dysfunction or systolic failure given his longstanding history of atrial fibrillation and underlying lung disease.  If labs and echocardiogram are normal, I suspect that the patient's leg swelling is likely due to his venous insufficiency and I will treat this with compression hose.  11/02/23 Patient's BNP was greater than 300.  Therefore, I obtain echocardiogram.  Echocardiogram recently showing ejection fraction 50.  There was no evidence of diastolic dysfunction or pulmonary hypertension.  The patient continues to have +1 pitting edema in both legs.  He continues to deny any orthopnea.  Today on exam Past Medical History:  Diagnosis Date   Asthma    Biliary colic 2014   COPD (chronic obstructive pulmonary disease) with chronic bronchitis (HCC)    Coronary artery calcification seen on CAT scan    Cyst of pancreas    1 cm (7/18), repeat ct in 12 months.     Hyperlipidemia    Hypertension    Obstructive sleep apnea    Renal disorder    Rhinitis    Sleep apnea    pt denies sleep apnea-lost weight   Past Surgical History:  Procedure Laterality Date    COLONOSCOPY  09/29/2014   LAPAROSCOPIC CHOLECYSTECTOMY SINGLE SITE WITH INTRAOPERATIVE CHOLANGIOGRAM N/A 09/15/2019   Procedure: LAPAROSCOPIC CHOLECYSTECTOMY SINGLE SITE;  Surgeon: Karie Soda, MD;  Location: [facility];  Service: General;  Laterality: N/A;   LITHOTRIPSY  2000   for kidney stone   Current Outpatient Medications on File Prior to Visit  Medication Sig Dispense Refill   albuterol (VENTOLIN HFA) 108 (90 Base) MCG/ACT inhaler INHALE 2 PUFFS INTO THE LUNGS EVERY 4 HOURS AS NEEDED FOR WHEEZING OR SHORTNESS OF BREATH. 8.5 each 2   apixaban (ELIQUIS) 5 MG TABS tablet Take 1 tablet (5 mg total) by mouth 2 (two) times daily. 60 tablet 5   Ascorbic Acid (VITAMIN C) 1000 MG tablet Take 1,000 mg by mouth 2 (two) times daily.     budesonide-formoterol (SYMBICORT) 160-4.5 MCG/ACT inhaler Inhale 2 puffs into the lungs in the morning and at bedtime. Generic medication 1 each 6   Cholecalciferol (VITAMIN D3) 125 MCG (5000 UT) TABS Take by mouth.     ciprofloxacin (CIPRO) 500 MG tablet Take 1 tablet (500 mg total) by mouth 2 (two) times daily. 20 tablet 11   Coenzyme Q10 (CO Q-10) 100 MG CAPS Take 1 capsule by mouth daily.     FIBER COMPLETE PO Take 1 tablet by mouth daily.     hydrochlorothiazide (HYDRODIURIL) 25 MG tablet Take 1 tablet (25 mg total)  by mouth daily. 90 tablet 2   losartan (COZAAR) 100 MG tablet Take 1 tablet (100 mg total) by mouth daily. 100 tablet 2   metoprolol succinate (TOPROL-XL) 25 MG 24 hr tablet Take 1 tablet (25 mg total) by mouth daily. 90 tablet 2   montelukast (SINGULAIR) 10 MG tablet Take 1 tablet (10 mg total) by mouth daily. 90 tablet 2   pantoprazole (PROTONIX) 40 MG tablet Take 1 tablet (40 mg total) by mouth 2 (two) times daily. 180 tablet 2   Respiratory Therapy Supplies (FLUTTER) DEVI Use as directed 1 each 0   rosuvastatin (CRESTOR) 20 MG tablet TAKE 1 TABLET BY MOUTH EVERY DAY **D/C LIPITOR** 90 tablet 2   No current facility-administered medications on  file prior to visit.   No Known Allergies Social History   Socioeconomic History   Marital status: Married    Spouse name: Not on file   Number of children: Not on file   Years of education: Not on file   Highest education level: Associate degree: occupational, Scientist, product/process development, or vocational program  Occupational History   Not on file  Tobacco Use   Smoking status: Former    Current packs/day: 0.00    Average packs/day: 2.0 packs/day for 11.0 years (22.0 ttl pk-yrs)    Types: Cigarettes    Start date: 12/20/1963    Quit date: 12/19/1974    Years since quitting: 48.9   Smokeless tobacco: Never  Vaping Use   Vaping status: Never Used  Substance and Sexual Activity   Alcohol use: Not Currently    Alcohol/week: 1.0 standard drink of alcohol    Types: 1 Glasses of wine per week   Drug use: No   Sexual activity: Not on file  Other Topics Concern   Not on file  Social History Narrative   Not on file   Social Determinants of Health   Financial Resource Strain: Low Risk  (10/30/2023)   Overall Financial Resource Strain (CARDIA)    Difficulty of Paying Living Expenses: Not hard at all  Food Insecurity: No Food Insecurity (10/30/2023)   Hunger Vital Sign    Worried About Running Out of Food in the Last Year: Never true    Ran Out of Food in the Last Year: Never true  Transportation Needs: No Transportation Needs (10/30/2023)   PRAPARE - Administrator, Civil Service (Medical): No    Lack of Transportation (Non-Medical): No  Physical Activity: Insufficiently Active (10/30/2023)   Exercise Vital Sign    Days of Exercise per Week: 2 days    Minutes of Exercise per Session: 20 min  Stress: No Stress Concern Present (10/30/2023)   [employer] of Occupational Health - Occupational Stress Questionnaire    Feeling of Stress : Not at all  Social Connections: Socially Integrated (10/30/2023)   Social Connection and Isolation Panel [NHANES]    Frequency of Communication with  Friends and Family: More than three times a week    Frequency of Social Gatherings with Friends and Family: More than three times a week    Attends Religious Services: More than 4 times per year    Active Member of [employer] or Organizations: Yes    Attends Banker Meetings: 1 to 4 times per year    Marital Status: Married  Catering manager Violence: Not At Risk (11/23/2022)   Humiliation, Afraid, Rape, and Kick questionnaire    Fear of Current or Ex-Partner: No    Emotionally Abused: No  Physically Abused: No    Sexually Abused: No      Review of Systems  Respiratory:  Positive for cough.   All other systems reviewed and are negative.      Objective:   Physical Exam Vitals reviewed.  Constitutional:      Appearance: He is well-developed.  HENT:     Head: Normocephalic and atraumatic.     Nose: Nose normal.     Mouth/Throat:     Pharynx: No oropharyngeal exudate.  Neck:     Vascular: No carotid bruit.  Cardiovascular:     Rate and Rhythm: Normal rate and regular rhythm.     Heart sounds: Normal heart sounds.  Pulmonary:     Effort: Pulmonary effort is normal. No respiratory distress.     Breath sounds: No stridor or decreased air movement. Rhonchi and rales present. No wheezing.    Musculoskeletal:     Right lower leg: Edema present.     Left lower leg: Edema present.           Assessment & Plan:  Decreased cardiac ejection fraction Patient has a mildly suppressed ejection fraction with an elevated BNP.  I do not feel that we need to begin [place] or Jardiance.  Instead I would simply discontinue hydrochlorothiazide and replace with Lasix 40 mg a day.  Recheck in 2 weeks and at that time obtain a BMP to monitor renal function and potassium

## 2023-11-03 ENCOUNTER — Other Ambulatory Visit: Payer: Self-pay | Admitting: Family Medicine

## 2023-11-03 DIAGNOSIS — J441 Chronic obstructive pulmonary disease with (acute) exacerbation: Secondary | ICD-10-CM

## 2023-11-03 NOTE — Telephone Encounter (Signed)
Requested Prescriptions  Pending Prescriptions Disp Refills   albuterol (VENTOLIN HFA) 108 (90 Base) MCG/ACT inhaler [Pharmacy Med Name: ALBUTEROL HFA (PROAIR) INHALER] 8.5 each 0    Sig: INHALE 2 PUFFS BY MOUTH EVERY 4 HOURS AS NEEDED FOR WHEEZE OR FOR SHORTNESS OF BREATH     Pulmonology:  Beta Agonists 2 Failed - 11/03/2023  2:41 AM      Failed - Valid encounter within last 12 months    Recent Outpatient Visits           2 years ago Pure hypercholesterolemia   [facility] Pickard, Priscille Heidelberg, MD   3 years ago Dysuria   [facility] Reserve, Velna Hatchet, MD   5 years ago Coronary artery calcification seen on CAT scan   [facility] Donita Brooks, MD   5 years ago Community acquired pneumonia of left lower lobe of lung (HCC)   [facility] Danelle Berry, PA-C   5 years ago Dyspnea, unspecified type   [facility] Pickard, Priscille Heidelberg, MD       Future Appointments             In 2 months Allyson Sabal Delton See, MD [facility] - Last BP in normal range    BP Readings from Last 1 Encounters:  11/02/23 124/72         Passed - Last Heart Rate in normal range    Pulse Readings from Last 1 Encounters:  11/02/23 100

## 2023-11-06 ENCOUNTER — Encounter: Payer: Self-pay | Admitting: Internal Medicine

## 2023-11-14 NOTE — Telephone Encounter (Signed)
Routing to  PA team

## 2023-11-15 ENCOUNTER — Other Ambulatory Visit (HOSPITAL_COMMUNITY): Payer: Self-pay

## 2023-11-17 ENCOUNTER — Other Ambulatory Visit (HOSPITAL_COMMUNITY): Payer: Self-pay

## 2023-11-17 NOTE — Telephone Encounter (Signed)
Test claim shows this was filled on 11-10-2023 and can be filled again 12-03-2023. Prior auth already in effect until 01-07-2024.

## 2023-11-18 ENCOUNTER — Other Ambulatory Visit: Payer: Self-pay | Admitting: Cardiovascular Disease

## 2023-11-18 DIAGNOSIS — I480 Paroxysmal atrial fibrillation: Secondary | ICD-10-CM

## 2023-11-19 NOTE — Telephone Encounter (Signed)
Prescription refill request for Eliquis received. Indication: a fib Last office visit: 01/17/23 Scr: 1.15 epic 09/11/23 Age 77:  Weight: 108kg

## 2023-11-24 ENCOUNTER — Ambulatory Visit (INDEPENDENT_AMBULATORY_CARE_PROVIDER_SITE_OTHER): Payer: No Typology Code available for payment source | Admitting: Family Medicine

## 2023-11-24 ENCOUNTER — Encounter: Payer: Self-pay | Admitting: Family Medicine

## 2023-11-24 VITALS — BP 124/72 | HR 98 | Temp 97.9°F | Wt 237.0 lb

## 2023-11-24 DIAGNOSIS — R931 Abnormal findings on diagnostic imaging of heart and coronary circulation: Secondary | ICD-10-CM

## 2023-11-24 DIAGNOSIS — I10 Essential (primary) hypertension: Secondary | ICD-10-CM

## 2023-11-24 NOTE — Progress Notes (Signed)
Subjective:    Patient ID: Mario [name], male    DOB: May 30, 1946, 77 y.o.   MRN: [id number] 09/11/23 Patient has a history of COPD as well as bronchiectasis.  Patient also has a history of atrial fibrillation.  Recently he has been dealing with more shortness of breath with activity.  He has also noticed swelling in his legs.  He has +1 pitting edema from his feet to his knees.  He has chronic venous insufficiency with numerous spider veins however this is more than previously seen.  His last echocardiogram was in 2016 but at that time his ejection fraction was normal.  He denies any chest pain.  He denies any orthopnea.  At that time, my plan was: I am concerned about his shortness of breath and his leg swelling.  I recommended a BNP to evaluate for evidence of heart failure.  I would also recommend an echocardiogram to evaluate for diastolic dysfunction or systolic failure given his longstanding history of atrial fibrillation and underlying lung disease.  If labs and echocardiogram are normal, I suspect that the patient's leg swelling is likely due to his venous insufficiency and I will treat this with compression hose.  11/02/23 Patient's BNP was greater than 300.  Therefore, I obtain echocardiogram.  Echocardiogram recently showing ejection fraction 50.  There was no evidence of diastolic dysfunction or pulmonary hypertension.  The patient continues to have +1 pitting edema in both legs.  He continues to deny any orthopnea.  At that time, my plan was: Patient has a mildly suppressed ejection fraction with an elevated BNP.  I do not feel that we need to begin [place] or Jardiance.  Instead I would simply discontinue hydrochlorothiazide and replace with Lasix 40 mg a day.  Recheck in 2 weeks and at that time obtain a BMP to monitor renal function and potassium  11/24/23 Wt Readings from Last 3 Encounters:  11/24/23 237 lb (107.5 kg)  11/02/23 238 lb 3.2 oz (108 kg)  10/16/23 236 lb 12.8 oz  (107.4 kg)   Patient is only lost 1 pound since his last visit.  He continues to have swelling in both legs.  He does not seem to have diuresed any since his last visit.  He continues to have some shortness of breath and wheezing primarily at night when he lays down.  His pulmonologist is scheduled him for a CT scan of the lungs.  Today he is in atrial fibrillation with a heart rate of 98/ Past Medical History:  Diagnosis Date   Asthma    Biliary colic 2014   COPD (chronic obstructive pulmonary disease) with chronic bronchitis (HCC)    Coronary artery calcification seen on CAT scan    Cyst of pancreas    1 cm (7/18), repeat ct in 12 months.     Hyperlipidemia    Hypertension    Obstructive sleep apnea    Renal disorder    Rhinitis    Sleep apnea    pt denies sleep apnea-lost weight   Past Surgical History:  Procedure Laterality Date   COLONOSCOPY  09/29/2014   LAPAROSCOPIC CHOLECYSTECTOMY SINGLE SITE WITH INTRAOPERATIVE CHOLANGIOGRAM N/A 09/15/2019   Procedure: LAPAROSCOPIC CHOLECYSTECTOMY SINGLE SITE;  Surgeon: Karie Soda, MD;  Location: [facility];  Service: General;  Laterality: N/A;   LITHOTRIPSY  2000   for kidney stone   Current Outpatient Medications on File Prior to Visit  Medication Sig Dispense Refill   albuterol (VENTOLIN HFA) 108 (90 Base) MCG/ACT  inhaler INHALE 2 PUFFS BY MOUTH EVERY 4 HOURS AS NEEDED FOR WHEEZE OR FOR SHORTNESS OF BREATH 8.5 each 0   apixaban (ELIQUIS) 5 MG TABS tablet TAKE 1 TABLET BY MOUTH TWICE A DAY 180 tablet 0   Ascorbic Acid (VITAMIN C) 1000 MG tablet Take 1,000 mg by mouth 2 (two) times daily.     budesonide-formoterol (SYMBICORT) 160-4.5 MCG/ACT inhaler Inhale 2 puffs into the lungs in the morning and at bedtime. Generic medication 1 each 6   Cholecalciferol (VITAMIN D3) 125 MCG (5000 UT) TABS Take by mouth.     ciprofloxacin (CIPRO) 500 MG tablet Take 1 tablet (500 mg total) by mouth 2 (two) times daily. 20 tablet 11   Coenzyme Q10 (CO Q-10)  100 MG CAPS Take 1 capsule by mouth daily.     FIBER COMPLETE PO Take 1 tablet by mouth daily.     furosemide (LASIX) 40 MG tablet Take 1 tablet (40 mg total) by mouth daily. Stop hctz 30 tablet 3   losartan (COZAAR) 100 MG tablet Take 1 tablet (100 mg total) by mouth daily. 100 tablet 2   metoprolol succinate (TOPROL-XL) 25 MG 24 hr tablet Take 1 tablet (25 mg total) by mouth daily. 90 tablet 2   montelukast (SINGULAIR) 10 MG tablet Take 1 tablet (10 mg total) by mouth daily. 90 tablet 2   pantoprazole (PROTONIX) 40 MG tablet Take 1 tablet (40 mg total) by mouth 2 (two) times daily. 180 tablet 2   predniSONE (DELTASONE) 20 MG tablet 3 tabs poqday 1-2, 2 tabs poqday 3-4, 1 tab poqday 5-6 12 tablet 0   Respiratory Therapy Supplies (FLUTTER) DEVI Use as directed 1 each 0   rosuvastatin (CRESTOR) 20 MG tablet TAKE 1 TABLET BY MOUTH EVERY DAY **D/C LIPITOR** 90 tablet 2   No current facility-administered medications on file prior to visit.   No Known Allergies Social History   Socioeconomic History   Marital status: Married    Spouse name: Not on file   Number of children: Not on file   Years of education: Not on file   Highest education level: Associate degree: occupational, Scientist, product/process development, or vocational program  Occupational History   Not on file  Tobacco Use   Smoking status: Former    Current packs/day: 0.00    Average packs/day: 2.0 packs/day for 11.0 years (22.0 ttl pk-yrs)    Types: Cigarettes    Start date: 12/20/1963    Quit date: 12/19/1974    Years since quitting: 48.9   Smokeless tobacco: Never  Vaping Use   Vaping status: Never Used  Substance and Sexual Activity   Alcohol use: Not Currently    Alcohol/week: 1.0 standard drink of alcohol    Types: 1 Glasses of wine per week   Drug use: No   Sexual activity: Not on file  Other Topics Concern   Not on file  Social History Narrative   Not on file   Social Determinants of Health   Financial Resource Strain: Low Risk   (10/30/2023)   Overall Financial Resource Strain (CARDIA)    Difficulty of Paying Living Expenses: Not hard at all  Food Insecurity: No Food Insecurity (10/30/2023)   Hunger Vital Sign    Worried About Running Out of Food in the Last Year: Never true    Ran Out of Food in the Last Year: Never true  Transportation Needs: No Transportation Needs (10/30/2023)   PRAPARE - Administrator, Civil Service (Medical): No  Lack of Transportation (Non-Medical): No  Physical Activity: Insufficiently Active (10/30/2023)   Exercise Vital Sign    Days of Exercise per Week: 2 days    Minutes of Exercise per Session: 20 min  Stress: No Stress Concern Present (10/30/2023)   [employer] of Occupational Health - Occupational Stress Questionnaire    Feeling of Stress : Not at all  Social Connections: Socially Integrated (10/30/2023)   Social Connection and Isolation Panel [NHANES]    Frequency of Communication with Friends and Family: More than three times a week    Frequency of Social Gatherings with Friends and Family: More than three times a week    Attends Religious Services: More than 4 times per year    Active Member of [employer] or Organizations: Yes    Attends Banker Meetings: 1 to 4 times per year    Marital Status: Married  Catering manager Violence: Not At Risk (11/23/2022)   Humiliation, Afraid, Rape, and Kick questionnaire    Fear of Current or Ex-Partner: No    Emotionally Abused: No    Physically Abused: No    Sexually Abused: No      Review of Systems  Respiratory:  Positive for cough.   All other systems reviewed and are negative.      Objective:   Physical Exam Vitals reviewed.  Constitutional:      Appearance: He is well-developed.  HENT:     Head: Normocephalic and atraumatic.     Nose: Nose normal.     Mouth/Throat:     Pharynx: No oropharyngeal exudate.  Neck:     Vascular: No carotid bruit.  Cardiovascular:     Rate and Rhythm:  Normal rate. Rhythm irregular.     Heart sounds: Normal heart sounds.  Pulmonary:     Effort: Pulmonary effort is normal. No respiratory distress.     Breath sounds: No stridor or decreased air movement. Rhonchi and rales present. No wheezing.    Musculoskeletal:     Right lower leg: Edema present.     Left lower leg: Edema present.           Assessment & Plan:  Decreased cardiac ejection fraction - Plan: Brain natriuretic peptide, BASIC METABOLIC PANEL WITH GFR  Essential hypertension - Plan: Brain natriuretic peptide, BASIC METABOLIC PANEL WITH GFR Increase Lasix to 80 mg a day for 3 days.  Check a BMP to monitor potassium and renal function.  Once the patient has diuresed (hopefully) I will reduce the dose of Lasix back to 40 mg a day to maintain.  I recommended the patient weigh himself on a daily basis and we can use his weight to help titrate his Lasix.  My plan would be to continue 40 mg a day if his weight stays stable and use an extra dose of Lasix if he gains more than 3 pounds in 24 hours or hold his Lasix if he loses more than 3 pounds in 24 hours.

## 2023-11-25 LAB — BRAIN NATRIURETIC PEPTIDE: Brain Natriuretic Peptide: 107 pg/mL — ABNORMAL HIGH (ref <100)

## 2023-11-25 LAB — BASIC METABOLIC PANEL WITH GFR
BUN/Creatinine Ratio: 24 (calc) — ABNORMAL HIGH (ref 6–22)
BUN: 30 mg/dL — ABNORMAL HIGH (ref 7–25)
CO2: 31 mmol/L (ref 20–32)
Calcium: 9.5 mg/dL (ref 8.6–10.3)
Chloride: 101 mmol/L (ref 98–110)
Creat: 1.27 mg/dL (ref 0.70–1.28)
Glucose, Bld: 109 mg/dL — ABNORMAL HIGH (ref 65–99)
Potassium: 4.4 mmol/L (ref 3.5–5.3)
Sodium: 139 mmol/L (ref 135–146)
eGFR: 58 mL/min/{1.73_m2} — ABNORMAL LOW (ref >=60)

## 2023-11-27 ENCOUNTER — Encounter: Payer: Self-pay | Admitting: Family Medicine

## 2023-11-30 ENCOUNTER — Other Ambulatory Visit (HOSPITAL_COMMUNITY): Payer: Self-pay

## 2023-11-30 NOTE — Telephone Encounter (Signed)
Due to the changes being made in 2025 for Medicare patients, a new prior authorization will have to wait until closer to when the original request will expire.

## 2023-12-04 ENCOUNTER — Ambulatory Visit (HOSPITAL_COMMUNITY): Payer: No Typology Code available for payment source

## 2023-12-05 ENCOUNTER — Ambulatory Visit (HOSPITAL_BASED_OUTPATIENT_CLINIC_OR_DEPARTMENT_OTHER)
Admission: RE | Admit: 2023-12-05 | Discharge: 2023-12-05 | Disposition: A | Discharge status: Home / Self Care | Payer: No Typology Code available for payment source | Source: Ambulatory Visit | Attending: Primary Care | Admitting: Primary Care

## 2023-12-05 DIAGNOSIS — J479 Bronchiectasis, uncomplicated: Secondary | ICD-10-CM | POA: Diagnosis not present

## 2023-12-05 DIAGNOSIS — J849 Interstitial pulmonary disease, unspecified: Secondary | ICD-10-CM | POA: Diagnosis not present

## 2023-12-05 DIAGNOSIS — J984 Other disorders of lung: Secondary | ICD-10-CM | POA: Diagnosis not present

## 2023-12-05 DIAGNOSIS — R918 Other nonspecific abnormal finding of lung field: Secondary | ICD-10-CM | POA: Diagnosis not present

## 2023-12-11 ENCOUNTER — Other Ambulatory Visit: Payer: Self-pay | Admitting: Family Medicine

## 2023-12-11 MED ORDER — BUDESONIDE-FORMOTEROL FUMARATE 160-4.5 MCG/ACT IN AERO: 2.0000 | INHALATION_SPRAY | Freq: Two times a day (BID) | RESPIRATORY_TRACT | 6 refills | Status: DC

## 2023-12-11 NOTE — Telephone Encounter (Signed)
Copied from CRM [id number]. Topic: Clinical - Medication Refill >> Dec 11, 2023  3:46 PM Ivette P wrote: Most Recent Primary Care Visit:  Provider: Lynnea Ferrier T  Department: [facility]  Visit Type: FOLLOW UP 15  Date: 11/24/2023  Medication: budesonide-formoterol (SYMBICORT) 160-4.5 MCG/ACT inhaler  Has the patient contacted their pharmacy? Yes (Agent: If no, request that the patient contact the pharmacy for the refill. If patient does not wish to contact the pharmacy document the reason why and proceed with request.) (Agent: If yes, when and what did the pharmacy advise?)  Is this the correct pharmacy for this prescription? Yes If no, delete pharmacy and type the correct one.  This is the patient's preferred pharmacy:  [id number] [place], [place] - [address] [place] [address] Phone: [phone] Fax: [phone]   Has the prescription been filled recently? No  Is the patient out of the medication? Yes  Has the patient been seen for an appointment in the last year OR does the patient have an upcoming appointment? Yes  Can we respond through MyChart? Yes  Agent: Please be advised that Rx refills may take up to 3 business days. We ask that you follow-up with your pharmacy.

## 2023-12-14 NOTE — Progress Notes (Signed)
Please let patient know CT chest showed some chronic findings, nothing acutely concerning but would like to go over them in person and have follow-up visit to see how he is doing. Offer apt with myself or his primary pulmonologist, first available is fine- not urgent

## 2023-12-15 MED ORDER — BUDESONIDE-FORMOTEROL FUMARATE 160-4.5 MCG/ACT IN AERO: 2.0000 | INHALATION_SPRAY | Freq: Two times a day (BID) | RESPIRATORY_TRACT | 6 refills | Status: DC

## 2023-12-15 NOTE — Telephone Encounter (Signed)
Copied from CRM [id number]. Topic: Clinical - Medication Refill >> Dec 15, 2023 12:24 PM Thomes Dinning wrote: Most Recent Primary Care Visit:  Provider: Lynnea Ferrier T  Department: [facility]  Visit Type: FOLLOW UP 15  Date: 11/24/2023  Medication:  budesonide-formoterol (SYMBICORT) 160-4.5 MCG/ACT inhaler  Has the patient contacted their pharmacy? Yes (Agent: If no, request that the patient contact the pharmacy for the refill. If patient does not wish to contact the pharmacy document the reason why and proceed with request.) (Agent: If yes, when and what did the pharmacy advise?)  Is this the correct pharmacy for this prescription? Yes If no, delete pharmacy and type the correct one.  This is the patient's preferred pharmacy:  [id number] [place], [place] - [address] [place] [address] Phone: [phone] Fax: [phone]   Has the prescription been filled recently? Yes  Is the patient out of the medication? Yes  Has the patient been seen for an appointment in the last year OR does the patient have an upcoming appointment? Yes  Can we respond through MyChart? Yes  Agent: Please be advised that Rx refills may take up to 3 business days. We ask that you follow-up with your pharmacy.

## 2023-12-18 ENCOUNTER — Telehealth: Payer: Self-pay

## 2023-12-18 NOTE — Telephone Encounter (Signed)
Copied from CRM [id number]. Topic: Clinical - Prescription Issue >> Dec 18, 2023  2:29 PM Clayton Bibles wrote: Reason for CRM: [facility] budesonide-formoterol (SYMBICORT) 160-4.5 MCG/ACT inhaler. Still has not received. He is out of med

## 2023-12-21 ENCOUNTER — Other Ambulatory Visit: Payer: Self-pay

## 2023-12-21 DIAGNOSIS — J4489 Other specified chronic obstructive pulmonary disease: Secondary | ICD-10-CM

## 2023-12-21 DIAGNOSIS — R0602 Shortness of breath: Secondary | ICD-10-CM

## 2023-12-21 MED ORDER — FLUTICASONE-SALMETEROL 250-50 MCG/ACT IN AEPB: 1.0000 | INHALATION_SPRAY | Freq: Two times a day (BID) | RESPIRATORY_TRACT | 1 refills | Status: DC

## 2023-12-22 ENCOUNTER — Other Ambulatory Visit: Payer: Self-pay

## 2023-12-22 DIAGNOSIS — J4489 Other specified chronic obstructive pulmonary disease: Secondary | ICD-10-CM

## 2023-12-22 DIAGNOSIS — R0602 Shortness of breath: Secondary | ICD-10-CM

## 2023-12-22 MED ORDER — BUDESONIDE-FORMOTEROL FUMARATE 160-4.5 MCG/ACT IN AERO: 2.0000 | INHALATION_SPRAY | Freq: Two times a day (BID) | RESPIRATORY_TRACT | 6 refills | Status: DC

## 2024-01-11 DIAGNOSIS — H33031 Retinal detachment with giant retinal tear, right eye: Secondary | ICD-10-CM | POA: Diagnosis not present

## 2024-01-11 DIAGNOSIS — H35431 Paving stone degeneration of retina, right eye: Secondary | ICD-10-CM | POA: Diagnosis not present

## 2024-01-11 DIAGNOSIS — H2513 Age-related nuclear cataract, bilateral: Secondary | ICD-10-CM | POA: Diagnosis not present

## 2024-01-11 DIAGNOSIS — H43812 Vitreous degeneration, left eye: Secondary | ICD-10-CM | POA: Diagnosis not present

## 2024-01-11 DIAGNOSIS — H33011 Retinal detachment with single break, right eye: Secondary | ICD-10-CM | POA: Diagnosis not present

## 2024-01-11 DIAGNOSIS — H33021 Retinal detachment with multiple breaks, right eye: Secondary | ICD-10-CM | POA: Diagnosis not present

## 2024-01-19 ENCOUNTER — Ambulatory Visit
Payer: No Typology Code available for payment source | Attending: Cardiovascular Disease | Admitting: Cardiovascular Disease

## 2024-01-19 ENCOUNTER — Encounter: Payer: Self-pay | Admitting: Cardiovascular Disease

## 2024-01-19 VITALS — BP 108/70 | HR 115 | Ht 69.0 in | Wt 235.0 lb

## 2024-01-19 DIAGNOSIS — I2510 Atherosclerotic heart disease of native coronary artery without angina pectoris: Secondary | ICD-10-CM | POA: Diagnosis not present

## 2024-01-19 DIAGNOSIS — E782 Mixed hyperlipidemia: Secondary | ICD-10-CM | POA: Diagnosis not present

## 2024-01-19 DIAGNOSIS — H33021 Retinal detachment with multiple breaks, right eye: Secondary | ICD-10-CM | POA: Diagnosis not present

## 2024-01-19 DIAGNOSIS — I10 Essential (primary) hypertension: Secondary | ICD-10-CM | POA: Diagnosis not present

## 2024-01-19 DIAGNOSIS — I480 Paroxysmal atrial fibrillation: Secondary | ICD-10-CM

## 2024-01-19 DIAGNOSIS — Z9889 Other specified postprocedural states: Secondary | ICD-10-CM | POA: Diagnosis not present

## 2024-01-19 MED ORDER — LOSARTAN POTASSIUM 50 MG PO TABS: 50.0000 mg | ORAL_TABLET | Freq: Every day | ORAL | 3 refills | Status: DC

## 2024-01-19 MED ORDER — METOPROLOL SUCCINATE ER 50 MG PO TB24: 50.0000 mg | ORAL_TABLET | Freq: Every day | ORAL | 3 refills | Status: DC

## 2024-01-19 NOTE — Assessment & Plan Note (Signed)
History of hyperlipidemia on statin therapy with lipid profile performed 03/26/21 revealing total cholesterol 133, LDL 57 and HDL of 60.

## 2024-01-19 NOTE — Assessment & Plan Note (Signed)
History of coronary calcification seen on CT in 2019 and most recently 12/07/2023.  Did have a negative Myoview back in 2020.  He denies chest pain.

## 2024-01-19 NOTE — Assessment & Plan Note (Signed)
History of PAF in the past probably persistent now on Eliquis.  His ventricular response is elevated at 115.  I am going to increase his Toprol-XL 25 mg to 50 mg a day for rate control.

## 2024-01-19 NOTE — Patient Instructions (Signed)
Medication Instructions:   DECREASE LOSARTAN TO 50 MG ONCE DAILY= 1/2 OF THE 100 MG TABLET ONCE DAILY  INCREASE METOPROLOL TO 50 MG ONCE DAILY= 2 OF THE 25 MG TABLETS ONCE DAILY  *If you need a refill on your cardiac medications before your next appointment, please call your pharmacy*   Follow-Up: At [facility], you and your health needs are our priority.  As part of our continuing mission to provide you with exceptional heart care, we have created designated Provider Care Teams.  These Care Teams include your primary Cardiologist (physician) and Advanced Practice Providers (APPs -  Physician Assistants and Nurse Practitioners) who all work together to provide you with the care you need, when you need it.  We recommend signing up for the patient portal called "MyChart".  Sign up information is provided on this After Visit Summary.  MyChart is used to connect with patients for Virtual Visits (Telemedicine).  Patients are able to view lab/test results, encounter notes, upcoming appointments, etc.  Non-urgent messages can be sent to your provider as well.   To learn more about what you can do with MyChart, go to [email or web address].    Your next appointment:   3 month(s)  Provider:   ANY APP    Then, Nanetta Batty, MD will plan to see you again in 12 month(s).

## 2024-01-19 NOTE — Progress Notes (Signed)
01/19/2024 Mario [name]   06-Aug-1946  [id number]  Primary Physician Tanya Nones Priscille Heidelberg, MD Primary Cardiologist: Runell Gess MD Nicholes Calamity, [place]  HPI:  Mario [name] is a 78 y.o.  . moderately overweight married Caucasian male father of 3 children, grandfather of one grandchild, who is retired from [employer] treasure[employer] and also runs a Control and instrumentation engineer.  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  I last saw him in the office 01/17/2023.  He was referred by Dr. Tanya Nones, his PCP, for evaluation and treatment of PAF. He has a history of hypertension, and hyperlipidemia. He does have obstructive sleep apnea on CPAP. He's never had a heart attack or stroke and denies chest pain or shortness of breath. 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 He drinks 2 cups of caffeine a day. He's had palpitations for the last 2 years. Mostly basis lasting up to one hour at a time.   Because of these palpitations, he had an event monitor performed 01/08/2018 revealing principally sinus rhythm with episodes of PAF.  Based on this he has continued his Eliquis oral anticoagulation.  Otherwise, he is asymptomatic specifically denying chest pain or shortness of breath.  He did lose over 50 pounds in the weight watchers program beginning initially 245 and currently weighing 190.  He did have sleep apnea but since his weight loss he no longer wears CPAP.  Since I saw him in the office a year ago he continues to do well.  He has gained weight back and now is 235 but is back on weight watchers.  He continues on Eliquis for now.  He denies chest pain but has had some dyspnea.  He is seeing a pulmonologist for reactive airways disease and bronchiectasis.  He was placed on Lasix by his primary care provider because of lower extremity edema which has since resolved.  He did have a 2D echocardiogram performed 10/31/2023 revealing normal LV systolic function with normal  valvular function.  Current Meds  Medication Sig   albuterol (VENTOLIN HFA) 108 (90 Base) MCG/ACT inhaler INHALE 2 PUFFS BY MOUTH EVERY 4 HOURS AS NEEDED FOR WHEEZE OR FOR SHORTNESS OF BREATH   apixaban (ELIQUIS) 5 MG TABS tablet TAKE 1 TABLET BY MOUTH TWICE A DAY   Ascorbic Acid (VITAMIN C) 1000 MG tablet Take 1,000 mg by mouth 2 (two) times daily.   budesonide-formoterol (SYMBICORT) 160-4.5 MCG/ACT inhaler Inhale 2 puffs into the lungs in the morning and at bedtime. Generic medication   Cholecalciferol (VITAMIN D3) 125 MCG (5000 UT) TABS Take by mouth.   Coenzyme Q10 (CO Q-10) 100 MG CAPS Take 1 capsule by mouth daily.   FIBER COMPLETE PO Take 1 tablet by mouth daily.   furosemide (LASIX) 40 MG tablet Take 1 tablet (40 mg total) by mouth daily. Stop hctz   montelukast (SINGULAIR) 10 MG tablet Take 1 tablet (10 mg total) by mouth daily.   pantoprazole (PROTONIX) 40 MG tablet Take 1 tablet (40 mg total) by mouth 2 (two) times daily.   Respiratory Therapy Supplies (FLUTTER) DEVI Use as directed   rosuvastatin (CRESTOR) 20 MG tablet TAKE 1 TABLET BY MOUTH EVERY DAY **D/C LIPITOR**   [DISCONTINUED] losartan (COZAAR) 100 MG tablet Take 1 tablet (100 mg total) by mouth daily.   [DISCONTINUED] metoprolol succinate (TOPROL-XL) 25 MG 24 hr tablet Take 1 tablet (25 mg total) by mouth daily.     No Known Allergies  Social History   Socioeconomic History   Marital status: Married    Spouse name: Not on file   Number of children: Not on file   Years of education: Not on file   Highest education level: Associate degree: occupational, Scientist, product/process development, or vocational program  Occupational History   Not on file  Tobacco Use   Smoking status: Former    Current packs/day: 0.00    Average packs/day: 2.0 packs/day for 11.0 years (22.0 ttl pk-yrs)    Types: Cigarettes    Start date: 12/20/1963    Quit date: 12/19/1974    Years since quitting: 49.1   Smokeless tobacco: Never  Vaping Use   Vaping status:  Never Used  Substance and Sexual Activity   Alcohol use: Not Currently    Alcohol/week: 1.0 standard drink of alcohol    Types: 1 Glasses of wine per week   Drug use: No   Sexual activity: Not on file  Other Topics Concern   Not on file  Social History Narrative   Not on file   Social Drivers of Health   Financial Resource Strain: Low Risk  (10/30/2023)   Overall Financial Resource Strain (CARDIA)    Difficulty of Paying Living Expenses: Not hard at all  Food Insecurity: No Food Insecurity (10/30/2023)   Hunger Vital Sign    Worried About Running Out of Food in the Last Year: Never true    Ran Out of Food in the Last Year: Never true  Transportation Needs: No Transportation Needs (10/30/2023)   PRAPARE - Administrator, Civil Service (Medical): No    Lack of Transportation (Non-Medical): No  Physical Activity: Insufficiently Active (10/30/2023)   Exercise Vital Sign    Days of Exercise per Week: 2 days    Minutes of Exercise per Session: 20 min  Stress: No Stress Concern Present (10/30/2023)   [employer] of Occupational Health - Occupational Stress Questionnaire    Feeling of Stress : Not at all  Social Connections: Socially Integrated (10/30/2023)   Social Connection and Isolation Panel [NHANES]    Frequency of Communication with Friends and Family: More than three times a week    Frequency of Social Gatherings with Friends and Family: More than three times a week    Attends Religious Services: More than 4 times per year    Active Member of [employer] or Organizations: Yes    Attends Banker Meetings: 1 to 4 times per year    Marital Status: Married  Catering manager Violence: Not At Risk (11/23/2022)   Humiliation, Afraid, Rape, and Kick questionnaire    Fear of Current or Ex-Partner: No    Emotionally Abused: No    Physically Abused: No    Sexually Abused: No     Review of Systems: General: negative for chills, fever, night sweats or weight  changes.  Cardiovascular: negative for chest pain, dyspnea on exertion, edema, orthopnea, palpitations, paroxysmal nocturnal dyspnea or shortness of breath Dermatological: negative for rash Respiratory: negative for cough or wheezing Urologic: negative for hematuria Abdominal: negative for nausea, vomiting, diarrhea, bright red blood per rectum, melena, or hematemesis Neurologic: negative for visual changes, syncope, or dizziness All other systems reviewed and are otherwise negative except as noted above.    Blood pressure 108/70, pulse (!) 115, height 5\' 9"  (1.753 m), weight 235 lb (106.6 kg), SpO2 95%.  General appearance: alert and no distress Neck: no adenopathy, no carotid bruit, no JVD, supple, symmetrical, trachea midline, and  thyroid not enlarged, symmetric, no tenderness/mass/nodules Lungs: clear to auscultation bilaterally Heart: irregularly irregular rhythm Extremities: edema no edema Pulses: 2+ and symmetric Skin: Skin color, texture, turgor normal. No rashes or lesions Neurologic: Grossly normal  EKG EKG Interpretation Date/Time:  Friday January 19 2024 14:37:00 EST Ventricular Rate:  115 PR Interval:    QRS Duration:  74 QT Interval:  330 QTC Calculation: 456 R Axis:   -40  Text Interpretation: Atrial fibrillation with rapid ventricular response with premature ventricular or aberrantly conducted complexes Left axis deviation When compared with ECG of 13-Sep-2019 16:57, PREVIOUS ECG IS PRESENT Confirmed by Nanetta Batty [phone]) on 01/19/2024 2:56:22 PM    ASSESSMENT AND PLAN:   Essential hypertension History of essential hypertension her blood pressure measured today at 108/70.  He is on losartan and metoprolol.  Hyperlipidemia History of hyperlipidemia on statin therapy with lipid profile performed 03/26/21 revealing total cholesterol 133, LDL 57 and HDL of 60.  Paroxysmal atrial fibrillation (HCC) History of PAF in the past probably persistent now on Eliquis.   His ventricular response is elevated at 115.  I am going to increase his Toprol-XL 25 mg to 50 mg a day for rate control.  Coronary artery calcification seen on CAT scan History of coronary calcification seen on CT in 2019 and most recently 12/07/2023.  Did have a negative Myoview back in 2020.  He denies chest pain.     Runell Gess MD FACP,FACC,FAHA, [facility] 01/19/2024 3:10 PM

## 2024-01-19 NOTE — Assessment & Plan Note (Signed)
History of essential hypertension her blood pressure measured today at 108/70.  He is on losartan and metoprolol.

## 2024-01-31 DIAGNOSIS — M545 Low back pain, unspecified: Secondary | ICD-10-CM | POA: Diagnosis not present

## 2024-02-03 ENCOUNTER — Other Ambulatory Visit: Payer: Self-pay | Admitting: Family Medicine

## 2024-02-05 NOTE — Telephone Encounter (Signed)
 Requested medication (s) are due for refill today: yes  Requested medication (s) are on the active medication list: yes   Last refill:  11/02/23/ #30 3 refills   Future visit scheduled: no   Notes to clinic:  last OV 11/24/23. Protocol failed labs. Do you want to continue refills?     Requested Prescriptions  Pending Prescriptions Disp Refills   furosemide (LASIX) 40 MG tablet [Pharmacy Med Name: FUROSEMIDE 40 MG TABLET] 30 tablet 3    Sig: Take 1 tablet (40 mg total) by mouth daily. Stop hctz     Cardiovascular:  Diuretics - Loop Failed - 02/05/2024 12:06 PM      Failed - Mg Level in normal range and within 180 days    Magnesium  Date Value Ref Range Status  09/16/2019 2.0 1.7 - 2.4 mg/dL Final    Comment:    Performed at [facility], [address], [place], [place] [address]         Failed - Valid encounter within last 6 months    Recent Outpatient Visits           2 years ago Pure hypercholesterolemia   [facility] [place], Priscille Heidelberg, MD   4 years ago Dysuria   [facility] [place], Velna Hatchet, MD   5 years ago Coronary artery calcification seen on CAT scan   [facility] Donita Brooks, MD   5 years ago Community acquired pneumonia of left lower lobe of lung (HCC)   [facility] Danelle Berry, PA-C   5 years ago Dyspnea, unspecified type   [facility] [place], Priscille Heidelberg, MD       Future Appointments             In 2 days Nyoka Cowden, MD [facility]   In 2 months Corrin Parker, PA-C [facility] - K in normal range and within 180 days    Potassium  Date Value Ref Range Status  11/24/2023 4.4 3.5 - 5.3 mmol/L Final         Passed - Ca in normal range and within 180 days    Calcium  Date Value Ref Range Status  11/24/2023 9.5 8.6 - 10.3 mg/dL Final          Passed - Na in normal range and within 180 days    Sodium  Date Value Ref Range Status  11/24/2023 139 135 - 146 mmol/L Final         Passed - Cr in normal range and within 180 days    Creat  Date Value Ref Range Status  11/24/2023 1.27 0.70 - 1.28 mg/dL Final         Passed - Cl in normal range and within 180 days    Chloride  Date Value Ref Range Status  11/24/2023 101 98 - 110 mmol/L Final         Passed - Last BP in normal range    BP Readings from Last 1 Encounters:  01/19/24 108/70

## 2024-02-05 NOTE — Progress Notes (Unsigned)
 Mario [name], male    DOB: 05/15/1946, 78 y.o.   MRN: [id number]   Brief patient profile:  77  yowm quit smoking in 1976 reports during  teens in [place] cough/ wheeze and several admits > [facility] dx allergy to grass/ dust with def correlation with his symptoms  but resolved by age 33 then recurred while smoking in 1976 and seemed to get worse while in [place]> pulmonary eval rx and happy with results but on daily meds ever since moved to [place] in 1991 and saw [facility] in 2000 for placed on shots x maybe a year but no change in maint therapy rx which seemed to help but freq flares changed from symbicort/spiriva  to trelegy then much worse while on trelegy which was around 03/2018 with cough/ wheeze>  On his own switched back to symbicort/spiriva around 06/25/18 and referred to pulmonary clinic 07/05/2018 by Dr   Tanya Nones.    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 Tanya Nones. Pt c/o cough and SOB x 3 months. He is coughing up clear sputum, esp worse in the am. He uses proair 1-2 x daily on average and has neb with albuterol that he rarely uses.   has sensation "lungs full of congestion" but doesn't cough much up and what he does is clear  cpap helps at hs / sleeps  on R side and bed is flat/ on left side side down wheezing keeps him up even on cpap Last pred 10 days prior to OV  And not better whereas previously worked great. Finished the abx about the time of the last ct 06/26/18  rec Bronchiectasis =   you have scarring of your bronchial tubes which means that they don't function perfectly normally and mucus tends to pool in certain areas of your lung which can cause pneumonia and further scarring of your lung and bronchial tubes Whenever you develop cough congestion take mucinex or mucinex dm > these will help keep the mucus loose and flowing but  if your condition worsens you need to seek help immediately preferably here or somewhere inside the [facility] system to compare xrays ( worse = darker or bloody mucus or pain on breathing in)   Use the flutter valve as much as possible Protonix 40 mg Take 30- 60 min before your first and last meals of the day  GERD (REFLUX)    schedule sinus CT > pos > ENT eval req Please schedule a follow up office visit in 4 weeks, sooner if needed with PFTs on return    08/13/2018  f/u ov/Mario [name] re: 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 Pt has c/o SOB with exertion, cough with clear mucus, occ. chest tightness.  rec Plan A = Automatic = symbiocort 160 x 2 and spiriva x 2 pffs  first thing in am  then 12 hours later Symbiort x 160 mg Work on inhaler technique:  Plan B = Backup Only use your albuterol as a rescue medication Plan C = Crisis - only use your albuterol nebulizer if you first try Plan B and it fails to help > ok to use the nebulizer up to every 4 hours but  if start needing it regularly call for immediate appointment   03/15/2021  f/u ov/Mario [name] re: obst bronchiectasis / main on sym b 160  Chief Complaint  Patient presents with   Follow-up    Coughing and wheezing at night  Dyspnea:  yardwork ok  Cough: at usual level  Re cough/  wheezing but worse since finished last  last zpak / no purulent sputum Sleeping: bed with bed blocks  SABA use: no saba  02: none  Rec Nasty mucus >  zpak Thick mucus/ congestions > flutter and mucinex  Please schedule a follow up visit in 6  months but call sooner if needed    09/17/21 NP recs Finish Zpack as directed.  Continue on Symbicort 2 puffs Twice daily  , rinse after use.  Albuterol inhaler or neb As needed   Mucinex Twice daily  As needed   Flutter valve Twice daily   Changed to Advair then to [facility] on 2 bid    05/01/2023   f/u ov/Mario [name] re: obst bronchiectasis maint on breztri   using flutter valve once a day   Chief Complaint  Patient presents with   Follow-up    Chest congestion with green mucus.  Has had 3 episodes since January 2024.  Dyspnea:  still some yard work  Cough: mucus cleared up with levaquin back green again this am also has some green nasal production - on ppi one mistake  Sleeping: bed has bed blocks > cough and wheeze  SABA use: tid including hs  02: none  Rec     02/07/2024  f/u ov/Mario [name] re: ***   maint on ***  No chief complaint on file.   Dyspnea:  *** Cough: *** Sleeping: *** resp cc  SABA use: *** 02: ***  Lung cancer screening :  ***    No obvious day to day or daytime variability or assoc excess/ purulent sputum or mucus plugs or hemoptysis or cp or chest tightness, subjective wheeze or overt sinus or hb symptoms.    Also denies any obvious fluctuation of symptoms with weather or environmental changes or other aggravating or alleviating factors except as outlined above   No unusual exposure hx or h/o childhood pna/ asthma or knowledge of premature birth.  Current Allergies, Complete Past Medical History, Past Surgical History, Family History, and Social History were reviewed in [employer] record.  ROS  The following are not active complaints unless bolded Hoarseness, sore throat, dysphagia, dental problems, itching, sneezing,  nasal congestion or discharge of excess mucus or purulent secretions, ear ache,   fever, chills, sweats, unintended wt loss or wt gain, classically pleuritic or exertional cp,  orthopnea pnd or arm/hand swelling  or leg swelling, presyncope, palpitations, abdominal pain, anorexia, nausea, vomiting, diarrhea  or change in bowel habits or change in bladder habits, change in stools or change in urine, dysuria, hematuria,  rash, arthralgias, visual complaints, headache, numbness, weakness or ataxia or problems with walking or  coordination,  change in mood or  memory.        No outpatient medications have been marked as taking for the 02/07/24 encounter (Appointment) with Nyoka Cowden, MD.                  Objective:    Wts  02/07/2024       ***  05/01/2023       243  03/15/2021       221 06/27/2019  192  12/25/2018         205  09/24/2018       212  08/13/2018       218   07/05/18 224 lb (101.6 kg)  06/12/18 230 lb (104.3 kg)  05/25/18 233 lb (105.7 kg)    Vital signs reviewed  02/07/2024  - Note at rest 02 sats  ***% on ***   General appearance:    ***     Min bar***   I personally reviewed images and agree with radiology impression as follows:  CXR:   pa and plateral  03/23/23 1. No radiographic evidence of acute cardiopulmonary disease. Mild bibasilar scarring, similar to prior studies. 2. Moderate-sized hiatal hernia. 3. Aortic atherosclerosis.    Assessment

## 2024-02-07 ENCOUNTER — Encounter: Payer: Self-pay | Admitting: Internal Medicine

## 2024-02-07 ENCOUNTER — Telehealth: Payer: No Typology Code available for payment source | Admitting: Internal Medicine

## 2024-02-07 DIAGNOSIS — J479 Bronchiectasis, uncomplicated: Secondary | ICD-10-CM | POA: Diagnosis not present

## 2024-02-07 NOTE — Assessment & Plan Note (Addendum)
 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  Both mastoid sinuses are clear.  Mild to moderate nasal septal deviation. - HRCT chest 09/19/2018  1. Mild cylindrical and varicoid bronchiectasis in the mid to lower lungs, unchanged. Associated mild bronchial wall thickening is slightly improved. 2. Stable scattered parenchymal bands in the mid to lower lungs, compatible with postinfectious/postinflammatory scarring. 3. Two month stability of 5 mm apical right upper lobe solid pulmonary nodule, more likely benign. Suggest follow-up chest CT in 6-12 months given the history of smoking and given that this nodule was previously seen to be new compared to 07/07/2017 chest CT. 4. Three-vessel coronary atherosclerosis. 5. Moderate hiatal hernia.  - flare since first of 2024 on advair then breztri  - 05/01/2023 rec change back to symbicort 80 vs dulera 100 plus cipro x 10day course and sinus CT / max gerd rx  -  05/14/23 Chronic paranasal sinus disease as described. There is progressive mucosal thickening both maxillary sinuses from 2019 exam. 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  Chest CT High Res    12/05/23  1. Diffuse bilateral bronchial wall thickening with interval increase in scarring and volume loss of the left lung base, similar scarring of the right lung base. Bronchiolar plugging throughout the left lung base as well as small irregular infectious or inflammatory nodules in the dependent left lower lobe. Findings are most consistent with aspiration, likely ongoing. 2. Mild lobular air  trapping on expiratory phase imaging, consistent with small airways disease. 3. Moderate hiatal hernia with intrathoracic position of the gastric fundus which may place the patient at risk for aspiration. 4. Coronary artery disease.   rec Reduce the symbicort to 160 mg one twice daily to improve immune response to recurrent infections   Increase the nebulizer to up to every 4 hours as needed  For cough/ congestion > mucinex dm  up to maximum of  1200 mg every 12 hours and use the flutter valve as much as you can    Return ASAP with all medications          Each maintenance medication was reviewed in detail including emphasizing most importantly the difference between maintenance and prns and under what circumstances the prns are to be triggered using an action plan format where appropriate.  Total time for H and P, chart review, counseling, reviewing hfa/neb/flutter  device(s) and generating customized AVS unique to this office visit / same day charting = 32 min vitual ov

## 2024-02-07 NOTE — Patient Instructions (Signed)
 Reduce the symbicort to 160 mg one twice daily   Increase the nebulizer to up to every 4 hours as needed  For cough/ congestion > mucinex dm  up to maximum of  1200 mg every 12 hours and use the flutter valve as much as you can    Return ASAP with all medications

## 2024-02-08 MED ORDER — ROSUVASTATIN CALCIUM 20 MG PO TABS: ORAL_TABLET | ORAL | 2 refills | Status: DC

## 2024-02-11 ENCOUNTER — Other Ambulatory Visit: Payer: Self-pay | Admitting: Family Medicine

## 2024-02-11 DIAGNOSIS — R0602 Shortness of breath: Secondary | ICD-10-CM

## 2024-02-11 DIAGNOSIS — J4489 Other specified chronic obstructive pulmonary disease: Secondary | ICD-10-CM

## 2024-02-12 ENCOUNTER — Encounter: Payer: Self-pay | Admitting: Internal Medicine

## 2024-02-12 MED ORDER — ALBUTEROL SULFATE 0.63 MG/3ML IN NEBU: 1.0000 | INHALATION_SOLUTION | Freq: Four times a day (QID) | RESPIRATORY_TRACT | 2 refills | Status: DC | PRN

## 2024-02-14 ENCOUNTER — Ambulatory Visit: Payer: Self-pay | Admitting: Family Medicine

## 2024-02-14 NOTE — Telephone Encounter (Signed)
  Chief Complaint: SOB at rest, wheezing more Symptoms: taking antibiotics for dx bronchiectasis per pulmonary dr. Now SOB at rest constant this am now comes and goes. Wheezing noted . Better when using inhalers.  Frequency: this am  Pertinent Negatives: Patient denies chest pain no difficulty breathing now , no fever no dizziness Disposition: [] ED /[] Urgent Care (no appt availability in office) / [x] Appointment(In office/virtual)/ []  [facility]/ [] Home Care/ [] Refused Recommended Disposition /[] [facility]/ []  Follow-up with PCP Additional Notes:   Appt scheduled for tomorrow. Recommended if sx worsen today go to UC or ED due to hx asthma       Copied from CRM #[id number]. Topic: Clinical - Red Word Triage >> Feb 14, 2024  8:59 AM Antwanette L wrote: Red Word that prompted transfer to Nurse Triage: Patient has asthma and having trouble breathing Reason for Disposition  [1] MILD difficulty breathing (e.g., minimal/no SOB at rest, SOB with walking, pulse <100) AND [2] NEW-onset or WORSE than normal  Answer Assessment - Initial Assessment Questions 1. RESPIRATORY STATUS: "Describe your breathing?" (e.g., wheezing, shortness of breath, unable to speak, severe coughing)      Shortness of breath , wheezing at times 2. ONSET: "When did this breathing problem begin?"      Couple of weeks ago  3. PATTERN "Does the difficult breathing come and go, or has it been constant since it started?"      Constant this am and now comes and goes  4. SEVERITY: "How bad is your breathing?" (e.g., mild, moderate, severe)    - MILD: No SOB at rest, mild SOB with walking, speaks normally in sentences, can lie down, no retractions, pulse < 100.    - MODERATE: SOB at rest, SOB with minimal exertion and prefers to sit, cannot lie down flat, speaks in phrases, mild retractions, audible wheezing, pulse 100-120.    - SEVERE: Very SOB at rest, speaks in single words, struggling to breathe, sitting  hunched forward, retractions, pulse > 120      SOB at rest better using inhaler 5. RECURRENT SYMPTOM: "Have you had difficulty breathing before?" If Yes, ask: "When was the last time?" and "What happened that time?"      Yes last seen by Dr. Sherene Sires 02/07/24 6. CARDIAC HISTORY: "Do you have any history of heart disease?" (e.g., heart attack, angina, bypass surgery, angioplasty)      na 7. LUNG HISTORY: "Do you have any history of lung disease?"  (e.g., pulmonary embolus, asthma, emphysema)     Hx asthma 8. CAUSE: "What do you think is causing the breathing problem?"      Na  9. OTHER SYMPTOMS: "Do you have any other symptoms? (e.g., dizziness, runny nose, cough, chest pain, fever)     SOB wheezing  10. O2 SATURATION MONITOR:  "Do you use an oxygen saturation monitor (pulse oximeter) at home?" If Yes, ask: "What is your reading (oxygen level) today?" "What is your usual oxygen saturation reading?" (e.g., 95%)       na 11. PREGNANCY: "Is there any chance you are pregnant?" "When was your last menstrual period?"       na 12. TRAVEL: "Have you traveled out of the country in the last month?" (e.g., travel history, exposures)       na  Protocols used: Breathing Difficulty-A-AH

## 2024-02-15 ENCOUNTER — Inpatient Hospital Stay (HOSPITAL_COMMUNITY)
Admission: EM | Admit: 2024-02-15 | Discharge: 2024-02-18 | DRG: 310 | Disposition: A | Discharge status: Home / Self Care | Payer: No Typology Code available for payment source | Attending: Internal Medicine | Admitting: Internal Medicine

## 2024-02-15 ENCOUNTER — Encounter: Payer: Self-pay | Admitting: Family Medicine

## 2024-02-15 ENCOUNTER — Other Ambulatory Visit: Payer: Self-pay

## 2024-02-15 ENCOUNTER — Ambulatory Visit (INDEPENDENT_AMBULATORY_CARE_PROVIDER_SITE_OTHER): Payer: No Typology Code available for payment source | Admitting: Family Medicine

## 2024-02-15 ENCOUNTER — Encounter (HOSPITAL_COMMUNITY): Payer: Self-pay | Admitting: Internal Medicine

## 2024-02-15 ENCOUNTER — Emergency Department (HOSPITAL_COMMUNITY): Payer: No Typology Code available for payment source

## 2024-02-15 VITALS — BP 122/76 | HR 86 | Temp 98.3°F | Ht 69.0 in | Wt 234.6 lb

## 2024-02-15 DIAGNOSIS — J4489 Other specified chronic obstructive pulmonary disease: Secondary | ICD-10-CM | POA: Diagnosis not present

## 2024-02-15 DIAGNOSIS — Z6834 Body mass index (BMI) 34.0-34.9, adult: Secondary | ICD-10-CM

## 2024-02-15 DIAGNOSIS — D649 Anemia, unspecified: Secondary | ICD-10-CM | POA: Diagnosis not present

## 2024-02-15 DIAGNOSIS — I4891 Unspecified atrial fibrillation: Secondary | ICD-10-CM | POA: Diagnosis not present

## 2024-02-15 DIAGNOSIS — I2510 Atherosclerotic heart disease of native coronary artery without angina pectoris: Secondary | ICD-10-CM | POA: Diagnosis present

## 2024-02-15 DIAGNOSIS — Z823 Family history of stroke: Secondary | ICD-10-CM

## 2024-02-15 DIAGNOSIS — Z7951 Long term (current) use of inhaled steroids: Secondary | ICD-10-CM

## 2024-02-15 DIAGNOSIS — I4819 Other persistent atrial fibrillation: Secondary | ICD-10-CM | POA: Diagnosis not present

## 2024-02-15 DIAGNOSIS — R0602 Shortness of breath: Secondary | ICD-10-CM

## 2024-02-15 DIAGNOSIS — Z87891 Personal history of nicotine dependence: Secondary | ICD-10-CM

## 2024-02-15 DIAGNOSIS — Z79899 Other long term (current) drug therapy: Secondary | ICD-10-CM

## 2024-02-15 DIAGNOSIS — R0600 Dyspnea, unspecified: Secondary | ICD-10-CM | POA: Diagnosis not present

## 2024-02-15 DIAGNOSIS — R918 Other nonspecific abnormal finding of lung field: Secondary | ICD-10-CM | POA: Diagnosis not present

## 2024-02-15 DIAGNOSIS — Z7901 Long term (current) use of anticoagulants: Secondary | ICD-10-CM

## 2024-02-15 DIAGNOSIS — J479 Bronchiectasis, uncomplicated: Secondary | ICD-10-CM | POA: Diagnosis present

## 2024-02-15 DIAGNOSIS — Z8261 Family history of arthritis: Secondary | ICD-10-CM

## 2024-02-15 DIAGNOSIS — J449 Chronic obstructive pulmonary disease, unspecified: Secondary | ICD-10-CM | POA: Diagnosis present

## 2024-02-15 DIAGNOSIS — E66812 Obesity, class 2: Secondary | ICD-10-CM | POA: Diagnosis present

## 2024-02-15 DIAGNOSIS — I10 Essential (primary) hypertension: Secondary | ICD-10-CM | POA: Diagnosis present

## 2024-02-15 DIAGNOSIS — G4733 Obstructive sleep apnea (adult) (pediatric): Secondary | ICD-10-CM | POA: Diagnosis present

## 2024-02-15 DIAGNOSIS — E785 Hyperlipidemia, unspecified: Secondary | ICD-10-CM | POA: Diagnosis present

## 2024-02-15 LAB — BASIC METABOLIC PANEL
Anion gap: 10 (ref 5–15)
BUN: 30 mg/dL — ABNORMAL HIGH (ref 8–23)
CO2: 22 mmol/L (ref 22–32)
Calcium: 8.9 mg/dL (ref 8.9–10.3)
Chloride: 106 mmol/L (ref 98–111)
Creatinine, Ser: 1.09 mg/dL (ref 0.61–1.24)
GFR, Estimated: >60 mL/min (ref >60)
Glucose, Bld: 97 mg/dL (ref 70–99)
Potassium: 3.9 mmol/L (ref 3.5–5.1)
Sodium: 138 mmol/L (ref 135–145)

## 2024-02-15 LAB — CBC
HCT: 35.9 % — ABNORMAL LOW (ref 39.0–52.0)
Hemoglobin: 11.3 g/dL — ABNORMAL LOW (ref 13.0–17.0)
MCH: 31.2 pg (ref 26.0–34.0)
MCHC: 31.5 g/dL (ref 30.0–36.0)
MCV: 99.2 fL (ref 80.0–100.0)
Platelets: 360 10*3/uL (ref 150–400)
RBC: 3.62 MIL/uL — ABNORMAL LOW (ref 4.22–5.81)
RDW: 15.6 % — ABNORMAL HIGH (ref 11.5–15.5)
WBC: 12.5 10*3/uL — ABNORMAL HIGH (ref 4.0–10.5)
nRBC: 0 % (ref 0.0–0.2)

## 2024-02-15 LAB — MAGNESIUM: Magnesium: 2 mg/dL (ref 1.7–2.4)

## 2024-02-15 LAB — BRAIN NATRIURETIC PEPTIDE: B Natriuretic Peptide: 230 pg/mL — ABNORMAL HIGH (ref 0.0–100.0)

## 2024-02-15 MED ORDER — ROSUVASTATIN CALCIUM 20 MG PO TABS
20.0000 mg | ORAL_TABLET | Freq: Every day | ORAL | Status: DC
  Administered 2024-02-16 – 2024-02-18 (×3): 20 mg via ORAL
  Filled 2024-02-15 (×3): qty 1

## 2024-02-15 MED ORDER — DORZOLAMIDE HCL-TIMOLOL MAL 2-0.5 % OP SOLN
1.0000 [drp] | Freq: Two times a day (BID) | OPHTHALMIC | Status: DC
  Filled 2024-02-15: qty 10

## 2024-02-15 MED ORDER — ACETAMINOPHEN 325 MG PO TABS
650.0000 mg | ORAL_TABLET | Freq: Four times a day (QID) | ORAL | Status: DC | PRN
  Administered 2024-02-15: 650 mg via ORAL
  Filled 2024-02-15: qty 2

## 2024-02-15 MED ORDER — APIXABAN 5 MG PO TABS
5.0000 mg | ORAL_TABLET | Freq: Two times a day (BID) | ORAL | Status: DC
  Administered 2024-02-15 – 2024-02-18 (×6): 5 mg via ORAL
  Filled 2024-02-15 (×6): qty 1

## 2024-02-15 MED ORDER — METOPROLOL SUCCINATE ER 50 MG PO TB24: 50.0000 mg | ORAL_TABLET | Freq: Every day | ORAL | Status: DC

## 2024-02-15 MED ORDER — DILTIAZEM HCL-DEXTROSE 125-5 MG/125ML-% IV SOLN (PREMIX)
5.0000 mg/h | INTRAVENOUS | Status: DC
  Administered 2024-02-15: 5 mg/h via INTRAVENOUS
  Filled 2024-02-15: qty 125

## 2024-02-15 MED ORDER — ALBUTEROL SULFATE (2.5 MG/3ML) 0.083% IN NEBU: 2.5000 mg | INHALATION_SOLUTION | RESPIRATORY_TRACT | Status: DC | PRN

## 2024-02-15 MED ORDER — ONDANSETRON HCL 4 MG/2ML IJ SOLN: 4.0000 mg | Freq: Four times a day (QID) | INTRAMUSCULAR | Status: DC | PRN

## 2024-02-15 MED ORDER — SODIUM CHLORIDE 0.9 % IV BOLUS
500.0000 mL | Freq: Once | INTRAVENOUS | Status: AC
  Administered 2024-02-15: 500 mL via INTRAVENOUS

## 2024-02-15 MED ORDER — FUROSEMIDE 40 MG PO TABS
40.0000 mg | ORAL_TABLET | Freq: Every day | ORAL | Status: DC
  Administered 2024-02-16 – 2024-02-18 (×3): 40 mg via ORAL
  Filled 2024-02-15 (×4): qty 1

## 2024-02-15 MED ORDER — LOSARTAN POTASSIUM 50 MG PO TABS: 50.0000 mg | ORAL_TABLET | Freq: Every day | ORAL | Status: DC

## 2024-02-15 MED ORDER — DILTIAZEM LOAD VIA INFUSION
20.0000 mg | Freq: Once | INTRAVENOUS | Status: AC
  Administered 2024-02-15: 20 mg via INTRAVENOUS
  Filled 2024-02-15: qty 20

## 2024-02-15 MED ORDER — ONDANSETRON HCL 4 MG PO TABS: 4.0000 mg | ORAL_TABLET | Freq: Four times a day (QID) | ORAL | Status: DC | PRN

## 2024-02-15 MED ORDER — MOMETASONE FURO-FORMOTEROL FUM 200-5 MCG/ACT IN AERO
2.0000 | INHALATION_SPRAY | Freq: Two times a day (BID) | RESPIRATORY_TRACT | Status: DC
  Administered 2024-02-15 – 2024-02-18 (×6): 2 via RESPIRATORY_TRACT
  Filled 2024-02-15: qty 8.8

## 2024-02-15 MED ORDER — MONTELUKAST SODIUM 10 MG PO TABS
10.0000 mg | ORAL_TABLET | Freq: Every day | ORAL | Status: DC
  Administered 2024-02-16 – 2024-02-18 (×3): 10 mg via ORAL
  Filled 2024-02-15 (×3): qty 1

## 2024-02-15 MED ORDER — LEVALBUTEROL HCL 0.63 MG/3ML IN NEBU: 0.6300 mg | INHALATION_SOLUTION | Freq: Four times a day (QID) | RESPIRATORY_TRACT | Status: DC | PRN

## 2024-02-15 MED ORDER — ACETAMINOPHEN 650 MG RE SUPP: 650.0000 mg | Freq: Four times a day (QID) | RECTAL | Status: DC | PRN

## 2024-02-15 MED ORDER — TRAZODONE HCL 50 MG PO TABS
25.0000 mg | ORAL_TABLET | Freq: Every evening | ORAL | Status: DC | PRN
  Administered 2024-02-17: 25 mg via ORAL
  Filled 2024-02-15 (×2): qty 1

## 2024-02-15 MED ORDER — METOPROLOL SUCCINATE ER 50 MG PO TB24
50.0000 mg | ORAL_TABLET | Freq: Every day | ORAL | Status: DC
Start: 2024-02-15 — End: 2024-02-15

## 2024-02-15 NOTE — ED Notes (Signed)
 Pt coming from PCP with A-Fib RVR. Pt has history of A-Fib.

## 2024-02-15 NOTE — ED Provider Notes (Signed)
 [place] EMERGENCY DEPARTMENT AT [facility] Provider Note   CSN: [id number] Arrival date & time: 02/15/24  1038     History  Chief Complaint  Patient presents with   Shortness of Breath   Tachycardia    Mario [name] is a 78 y.o. male.   Shortness of Breath    Patient has a history of obstructive sleep apnea hyperlipidemia hypertension COPD paroxysmal atrial fibrillation.  Patient states he has been having some trouble with feeling short of breath the last couple days.  It is especially worse when he exerts himself.  He also is having more trouble with sleeping at night.  Patient thought it was related to his pulmonary issues.  He has been using his inhaler frequently.  He went to his doctor's office today and it was noted to be in A-fib with rapid rate.  He was instructed to go to the ED.  Patient states he takes metoprolol and Eliquis.  He did not take his Eliquis dose this morning.  He also states he may have missed a dose here and there intermittently but generally does try to take his medications regularly  Home Medications Prior to Admission medications   Medication Sig Start Date End Date Taking? Authorizing Provider  albuterol (ACCUNEB) 0.63 MG/3ML nebulizer solution Take 3 mLs (0.63 mg total) by nebulization every 6 (six) hours as needed for wheezing. 02/12/24  Yes Nyoka Cowden, MD  albuterol (VENTOLIN HFA) 108 (90 Base) MCG/ACT inhaler INHALE 2 PUFFS BY MOUTH EVERY 4 HOURS AS NEEDED FOR WHEEZE OR FOR SHORTNESS OF BREATH 11/03/23  Yes Donita Brooks, MD  apixaban (ELIQUIS) 5 MG TABS tablet TAKE 1 TABLET BY MOUTH TWICE A DAY 11/19/23  Yes Runell Gess, MD  Ascorbic Acid (VITAMIN C) 1000 MG tablet Take 1,000 mg by mouth 2 (two) times daily. 11/18/20  Yes [provider]  budesonide-formoterol (SYMBICORT) 160-4.5 MCG/ACT inhaler Inhale 2 puffs into the lungs in the morning and at bedtime. Generic medication 12/22/23  Yes Donita Brooks, MD   Cholecalciferol (VITAMIN D3) 125 MCG (5000 UT) TABS Take by mouth.   Yes [provider]  ciprofloxacin (CIPRO) 500 MG tablet Take 1 tablet by mouth 2 (two) times daily.   Yes [provider]  Coenzyme Q10 (CO Q-10) 100 MG CAPS Take 1 capsule by mouth daily.   Yes [provider]  dorzolamide-timolol (COSOPT) 2-0.5 % ophthalmic solution Place 1 drop into the right eye 2 (two) times daily.   Yes [provider]  FIBER COMPLETE PO Take 1 tablet by mouth daily.   Yes [provider]  furosemide (LASIX) 40 MG tablet TAKE 1 TABLET (40 MG TOTAL) BY MOUTH DAILY. STOP HCTZ 02/08/24  Yes Donita Brooks, MD  losartan (COZAAR) 50 MG tablet Take 1 tablet (50 mg total) by mouth daily. 01/19/24  Yes Runell Gess, MD  metoprolol succinate (TOPROL-XL) 50 MG 24 hr tablet Take 1 tablet (50 mg total) by mouth daily. 01/19/24  Yes Runell Gess, MD  montelukast (SINGULAIR) 10 MG tablet Take 1 tablet (10 mg total) by mouth daily. 09/11/23  Yes Donita Brooks, MD  pantoprazole (PROTONIX) 40 MG tablet Take 1 tablet (40 mg total) by mouth 2 (two) times daily. 09/11/23  Yes Donita Brooks, MD  rosuvastatin (CRESTOR) 20 MG tablet TAKE 1 TABLET BY MOUTH EVERY DAY **D/C LIPITOR** 02/08/24  Yes Donita Brooks, MD  tiZANidine (ZANAFLEX) 2 MG tablet Take 2  mg by mouth at bedtime.   Yes [provider]  predniSONE (DELTASONE) 20 MG tablet 3 tabs poqday 1-2, 2 tabs poqday 3-4, 1 tab poqday 5-6 Patient not taking: Reported on 02/15/2024 11/02/23   Donita Brooks, MD  Respiratory Therapy Supplies (FLUTTER) DEVI Use as directed 07/05/18   Nyoka Cowden, MD      Allergies    Dust mite extract    Review of Systems   Review of Systems  Respiratory:  Positive for shortness of breath.     Physical Exam Updated Vital Signs BP 96/66 (BP Location: Left Arm)   Pulse 98   Temp 97.8 F (36.6 C) (Oral)   Resp 16   SpO2 98%  Physical Exam  ED Results /  Procedures / Treatments   Labs (all labs ordered are listed, but only abnormal results are displayed) Labs Reviewed  BASIC METABOLIC PANEL - Abnormal; Notable for the following components:      Result Value   BUN 30 (*)    All other components within normal limits  CBC - Abnormal; Notable for the following components:   WBC 12.5 (*)    RBC 3.62 (*)    Hemoglobin 11.3 (*)    HCT 35.9 (*)    RDW 15.6 (*)    All other components within normal limits  BRAIN NATRIURETIC PEPTIDE - Abnormal; Notable for the following components:   B Natriuretic Peptide 230.0 (*)    All other components within normal limits  MAGNESIUM    EKG EKG Interpretation Date/Time:  Thursday February 15 2024 11:12:59 EST Ventricular Rate:  160 PR Interval:    QRS Duration:  69 QT Interval:  279 QTC Calculation: 476 R Axis:   -17  Text Interpretation: Atrial fibrillation with rapid V-rate Ventricular premature complex Borderline left axis deviation Low voltage, precordial leads Consider anterior infarct Since last tracing rate faster Confirmed by Linwood Dibbles [phone]) on 02/15/2024 11:18:04 AM  Radiology DG Chest Port 1 View Result Date: 02/15/2024 CLINICAL DATA:  Dyspnea. EXAM: PORTABLE CHEST 1 VIEW COMPARISON:  03/23/2023. FINDINGS: Stable left retrocardiac opacity, likely obscuring the left costophrenic angle. Findings may represent combination of left lung atelectasis and/or consolidation with probable pleural effusion versus epicardial fat pad. Bilateral lung fields are otherwise clear. No acute consolidation or lung collapse. Bilateral costophrenic angles are clear. Stable cardio-mediastinal silhouette. No acute osseous abnormalities. The soft tissues are within normal limits. IMPRESSION: *Stable left retrocardiac opacity, as described above. No acute cardiopulmonary abnormality. Electronically Signed   By: Jules Schick M.D.   On: 02/15/2024 14:28    Procedures .Critical Care  Performed by: Linwood Dibbles,  MD Authorized by: Linwood Dibbles, MD   Critical care provider statement:    Critical care time (minutes):  35   Critical care was time spent personally by me on the following activities:  Development of treatment plan with patient or surrogate, discussions with consultants, evaluation of patient's response to treatment, examination of patient, ordering and review of laboratory studies, ordering and review of radiographic studies, ordering and performing treatments and interventions, pulse oximetry, re-evaluation of patient's condition and review of old charts     Medications Ordered in ED Medications  diltiazem (CARDIZEM) 1 mg/mL load via infusion 20 mg (20 mg Intravenous Bolus from Bag 02/15/24 1138)    And  diltiazem (CARDIZEM) 125 mg in dextrose 5% 125 mL (1 mg/mL) infusion (5 mg/hr Intravenous New Bag/Given 02/15/24 1138)  sodium chloride 0.9 % bolus 500 mL (500 mLs  Intravenous New Bag/Given 02/15/24 1131)    ED Course/ Medical Decision Making/ A&P Clinical Course as of 02/15/24 1552  Thu Feb 15, 2024  1514 Chest x-ray does not show any acute abnormalities. [JK]  1515 CBC(!) CBC shows anemia new compared to previous values.  Metabolic panel normal.  Magnesium normal. [JK]  1552 Case discussed with Dr Erenest Blank regarding admission. [JK]    Clinical Course User Index [JK] Linwood Dibbles, MD         CHA2DS2-VASc Score: 3                        Medical Decision Making Problems Addressed: Anemia, unspecified type: undiagnosed new problem with uncertain prognosis Atrial fibrillation, rapid [facility]): acute illness or injury that poses a threat to life or bodily functions  Amount and/or Complexity of Data Reviewed Labs: ordered. Decision-making details documented in ED Course. Radiology: ordered and independent interpretation performed. ECG/medicine tests: ordered.  Risk Prescription drug management. Decision regarding hospitalization.  Patient is not sure that has been compliant with his  Eliquis over the last couple weeks.  Not a candidate for ED cardioversion for this reason.  He is otherwise hemodynamically stable.  Will proceed with rate control  Patient presented to the ED for evaluation of atrial fibrillation rapid rate.  Patient has history of atrial fibrillation but is normally rate controlled.  He has had some URI symptoms recently and has been using his breathing treatments is possible that could have precipitated the tachycardia.  Patient has not sure that he is taken all of his doses over the last several weeks and not a candidate for ED cardioversion.  Patient was started on IV Cardizem with improvement in his heart rate.  X-ray without signs of pulmonary edema.  Anemia is noted on his hemoglobin.  He denies any blood in stool will continue to monitor.  I will consult medical service for admission for further treatment        Final Clinical Impression(s) / ED Diagnoses Final diagnoses:  Atrial fibrillation, rapid (HCC)  Anemia, unspecified type    Rx / DC Orders ED Discharge Orders     None         Linwood Dibbles, MD 02/15/24 1553

## 2024-02-15 NOTE — ED Triage Notes (Signed)
 Pt referred to ED from PCP. Pt has been experiencing worsening SOB, especially with exertion. Using his albuterol inhaler frequently. When at PCP's office he was found to be in afib with RVR. Pt denies CP at this time. Takes metoprolol daily and eliquis BID. Last doses last night.

## 2024-02-15 NOTE — H&P (Signed)
 History and Physical  Mario [name] EAV:[id number] DOB: 1946-01-22 DOA: 02/15/2024  PCP: Donita Brooks, MD   Chief Complaint: Shortness of breath  HPI: Mario [name] is a 78 y.o. male with medical history significant for chronic bronchiectasis, hypertension, hyperlipidemia, atrial fibrillation on metoprolol and Eliquis being admitted to the hospital in the setting of exertional dyspnea, found to be in rapid atrial fibrillation.  States he is overall compliant with medications, but may miss a dose of Eliquis here and there.  He did take his Toprol-XL this morning.  Due to shortness of breath, he went to his PCP this morning, at their office he was found to be in rapid A-fib and sent to the ER for evaluation.  He denies any recent fevers, chills, nausea, vomiting, chest pain, or syncope/presyncope.  Here in the emergency department, his workup was relatively unremarkable, though he was found to be in rapid A-fib with heart rate in the 150s.  He was placed on IV Cardizem drip, by the time I came to see the patient his heart rate has improved to about 100, and Cardizem drip was placed on hold due to systolic blood pressure in the 90s.  Currently he is resting comfortably and asymptomatic.  Review of Systems: Please see HPI for pertinent positives and negatives. A complete 10 system review of systems are otherwise negative.  Past Medical History:  Diagnosis Date   Asthma    Biliary colic 2014   COPD (chronic obstructive pulmonary disease) with chronic bronchitis (HCC)    Coronary artery calcification seen on CAT scan    Cyst of pancreas    1 cm (7/18), repeat ct in 12 months.     Hyperlipidemia    Hypertension    Obstructive sleep apnea    Renal disorder    Rhinitis    Sleep apnea    pt denies sleep apnea-lost weight   Past Surgical History:  Procedure Laterality Date   COLONOSCOPY  09/29/2014   LAPAROSCOPIC CHOLECYSTECTOMY SINGLE SITE WITH INTRAOPERATIVE CHOLANGIOGRAM N/A 09/15/2019    Procedure: LAPAROSCOPIC CHOLECYSTECTOMY SINGLE SITE;  Surgeon: Karie Soda, MD;  Location: [facility];  Service: General;  Laterality: N/A;   LITHOTRIPSY  2000   for kidney stone   Social History:  reports that he quit smoking about 49 years ago. His smoking use included cigarettes. He started smoking about 60 years ago. He has a 22 pack-year smoking history. He has never used smokeless tobacco. He reports that he does not currently use alcohol after a past usage of about 1.0 standard drink of alcohol per week. He reports that he does not use drugs.  Allergies  Allergen Reactions   Dust Mite Extract Other (See Comments)    Family History  Problem Relation Age of Onset   Rheumatic fever Mother    Stroke Father    Atrial fibrillation Father    Colon polyps Father    Arthritis Sister    Colon cancer Neg Hx    Esophageal cancer Neg Hx    Rectal cancer Neg Hx    Stomach cancer Neg Hx      Prior to Admission medications   Medication Sig Start Date End Date Taking? Authorizing Provider  albuterol (ACCUNEB) 0.63 MG/3ML nebulizer solution Take 3 mLs (0.63 mg total) by nebulization every 6 (six) hours as needed for wheezing. 02/12/24  Yes Nyoka Cowden, MD  albuterol (VENTOLIN HFA) 108 (90 Base) MCG/ACT inhaler INHALE 2 PUFFS BY MOUTH EVERY 4 HOURS AS NEEDED  FOR WHEEZE OR FOR SHORTNESS OF BREATH 11/03/23  Yes Donita Brooks, MD  apixaban (ELIQUIS) 5 MG TABS tablet TAKE 1 TABLET BY MOUTH TWICE A DAY 11/19/23  Yes Runell Gess, MD  Ascorbic Acid (VITAMIN C) 1000 MG tablet Take 1,000 mg by mouth 2 (two) times daily. 11/18/20  Yes [provider]  budesonide-formoterol (SYMBICORT) 160-4.5 MCG/ACT inhaler Inhale 2 puffs into the lungs in the morning and at bedtime. Generic medication 12/22/23  Yes Donita Brooks, MD  Cholecalciferol (VITAMIN D3) 125 MCG (5000 UT) TABS Take by mouth.   Yes [provider]  ciprofloxacin (CIPRO) 500 MG tablet Take 1 tablet by mouth 2 (two)  times daily.   Yes [provider]  Coenzyme Q10 (CO Q-10) 100 MG CAPS Take 1 capsule by mouth daily.   Yes [provider]  dorzolamide-timolol (COSOPT) 2-0.5 % ophthalmic solution Place 1 drop into the right eye 2 (two) times daily.   Yes [provider]  FIBER COMPLETE PO Take 1 tablet by mouth daily.   Yes [provider]  furosemide (LASIX) 40 MG tablet TAKE 1 TABLET (40 MG TOTAL) BY MOUTH DAILY. STOP HCTZ 02/08/24  Yes Donita Brooks, MD  losartan (COZAAR) 50 MG tablet Take 1 tablet (50 mg total) by mouth daily. 01/19/24  Yes Runell Gess, MD  metoprolol succinate (TOPROL-XL) 50 MG 24 hr tablet Take 1 tablet (50 mg total) by mouth daily. 01/19/24  Yes Runell Gess, MD  montelukast (SINGULAIR) 10 MG tablet Take 1 tablet (10 mg total) by mouth daily. 09/11/23  Yes Donita Brooks, MD  pantoprazole (PROTONIX) 40 MG tablet Take 1 tablet (40 mg total) by mouth 2 (two) times daily. 09/11/23  Yes Donita Brooks, MD  rosuvastatin (CRESTOR) 20 MG tablet TAKE 1 TABLET BY MOUTH EVERY DAY **D/C LIPITOR** 02/08/24  Yes Donita Brooks, MD  tiZANidine (ZANAFLEX) 2 MG tablet Take 2 mg by mouth at bedtime.   Yes [provider]  predniSONE (DELTASONE) 20 MG tablet 3 tabs poqday 1-2, 2 tabs poqday 3-4, 1 tab poqday 5-6 Patient not taking: Reported on 02/15/2024 11/02/23   Donita Brooks, MD  Respiratory Therapy Supplies (FLUTTER) DEVI Use as directed 07/05/18   Nyoka Cowden, MD    Physical Exam: BP 100/76   Pulse 99   Temp 97.8 F (36.6 C) (Oral)   Resp 17   SpO2 96%  General:  Alert, oriented, calm, in no acute distress  Eyes: EOMI, clear conjuctivae, white sclerea Neck: supple, no masses, trachea mildline  Cardiovascular: Irregularly irregular, no murmurs or rubs, no peripheral edema  Respiratory: clear to auscultation bilaterally, no wheezes, no crackles  Abdomen: soft, nontender, nondistended, normal bowel tones heard  Skin: dry, no  rashes  Musculoskeletal: no joint effusions, normal range of motion  Psychiatric: appropriate affect, normal speech  Neurologic: extraocular muscles intact, clear speech, moving all extremities with intact sensorium         Labs on Admission:  Basic Metabolic Panel: Recent Labs  Lab 02/15/24 1354  NA 138  K 3.9  CL 106  CO2 22  GLUCOSE 97  BUN 30*  CREATININE 1.09  CALCIUM 8.9  MG 2.0   Liver Function Tests: No results for input(s): "AST", "ALT", "ALKPHOS", "BILITOT", "PROT", "ALBUMIN" in the last 168 hours. No results for input(s): "LIPASE", "AMYLASE" in the last 168 hours. No results for input(s): "AMMONIA" in the last 168 hours. CBC: Recent Labs  Lab  02/15/24 1354  WBC 12.5*  HGB 11.3*  HCT 35.9*  MCV 99.2  PLT 360   Cardiac Enzymes: No results for input(s): "CKTOTAL", "CKMB", "CKMBINDEX", "TROPONINI" in the last 168 hours. BNP (last 3 results) Recent Labs    09/11/23 1622 11/24/23 1622 02/15/24 1354  BNP 350* 107* 230.0*    ProBNP (last 3 results) No results for input(s): "PROBNP" in the last 8760 hours.  CBG: No results for input(s): "GLUCAP" in the last 168 hours.  Radiological Exams on Admission: DG Chest Port 1 View Result Date: 02/15/2024 CLINICAL DATA:  Dyspnea. EXAM: PORTABLE CHEST 1 VIEW COMPARISON:  03/23/2023. FINDINGS: Stable left retrocardiac opacity, likely obscuring the left costophrenic angle. Findings may represent combination of left lung atelectasis and/or consolidation with probable pleural effusion versus epicardial fat pad. Bilateral lung fields are otherwise clear. No acute consolidation or lung collapse. Bilateral costophrenic angles are clear. Stable cardio-mediastinal silhouette. No acute osseous abnormalities. The soft tissues are within normal limits. IMPRESSION: *Stable left retrocardiac opacity, as described above. No acute cardiopulmonary abnormality. Electronically Signed   By: Jules Schick M.D.   On: 02/15/2024 14:28    Assessment/Plan Mario [name] is a 78 y.o. male with medical history significant for chronic bronchiectasis, hypertension, hyperlipidemia, atrial fibrillation on metoprolol and Eliquis being admitted to the hospital in the setting of exertional dyspnea, found to be in rapid atrial fibrillation.   Atrial fibrillation with RVR-I suspect his rapid A-fib is the reason that he was feeling dyspnea with exertion, I am not seeing any objective evidence of exacerbation of his COPD/bronchiectasis.  Patient does mention that he recently started taking his albuterol inhaler more frequently, this could certainly be an inciting factor. -Observation admission -Continuous telemetry monitoring -Given he is currently rate controlled, we will hold off on Cardizem drip for the time being -He did take his Toprol-XL this morning -In case of recurrent rapid ventricular response, would treat with IV amiodarone to avoid hypotension -Check TSH -Continue Eliquis for stroke prophylaxis  Bronchiectasis-patient is currently asymptomatic, without evidence of exacerbation of his pulmonary disease.  Stable on room air without any dyspnea, or cough. -Continue Dulera, Singulair -Xopenex neb for cough/shortness of breath, avoid albuterol  Hyperlipidemia-Crestor  Hypertension-hold home losartan in the setting of relative hypotension, can likely resume in the morning if blood pressure stable    Code Status: Full Code  Consults called: None  Admission status: Observation  Time spent: 55 minutes  Thanvi Blincoe Sharlette Dense MD [facility] Pager [phone]  If 7PM-7AM, please contact night-coverage [email or web address] Password [facility]  02/15/2024, 4:06 PM

## 2024-02-15 NOTE — Progress Notes (Signed)
 Subjective:    Patient ID: Mario [name], male    DOB: 07-21-1946, 78 y.o.   MRN: [id number] 09/11/23 Patient has a history of COPD as well as bronchiectasis.  Patient also has a history of atrial fibrillation.  Recently he has been dealing with more shortness of breath with activity.  He has also noticed swelling in his legs.  He has +1 pitting edema from his feet to his knees.  He has chronic venous insufficiency with numerous spider veins however this is more than previously seen.  His last echocardiogram was in 2016 but at that time his ejection fraction was normal.  He denies any chest pain.  He denies any orthopnea.  At that time, my plan was: I am concerned about his shortness of breath and his leg swelling.  I recommended a BNP to evaluate for evidence of heart failure.  I would also recommend an echocardiogram to evaluate for diastolic dysfunction or systolic failure given his longstanding history of atrial fibrillation and underlying lung disease.  If labs and echocardiogram are normal, I suspect that the patient's leg swelling is likely due to his venous insufficiency and I will treat this with compression hose.  11/02/23 Patient's BNP was greater than 300.  Therefore, I obtain echocardiogram.  Echocardiogram recently showing ejection fraction 50.  There was no evidence of diastolic dysfunction or pulmonary hypertension.  The patient continues to have +1 pitting edema in both legs.  He continues to deny any orthopnea.  At that time, my plan was: Patient has a mildly suppressed ejection fraction with an elevated BNP.  I do not feel that we need to begin [place] or [place].  Instead I would simply discontinue hydrochlorothiazide and replace with Lasix 40 mg a day.  Recheck in 2 weeks and at that time obtain a BMP to monitor renal function and potassium  11/24/23 Wt Readings from Last 3 Encounters:  02/15/24 234 lb 9.6 oz (106.4 kg)  01/19/24 235 lb (106.6 kg)  11/24/23 237 lb (107.5 kg)    Patient is only lost 1 pound since his last visit.  He continues to have swelling in both legs.  He does not seem to have diuresed any since his last visit.  He continues to have some shortness of breath and wheezing primarily at night when he lays down.  His pulmonologist is scheduled him for a CT scan of the lungs.  Today he is in atrial fibrillation with a heart rate of 98.  At that time, my plan was: Increase Lasix to 80 mg a day for 3 days.  Check a BMP to monitor potassium and renal function.  Once the patient has diuresed (hopefully) I will reduce the dose of Lasix back to 40 mg a day to maintain.  I recommended the patient weigh himself on a daily basis and we can use his weight to help titrate his Lasix.  My plan would be to continue 40 mg a day if his weight stays stable and use an extra dose of Lasix if he gains more than 3 pounds in 24 hours or hold his Lasix if he loses more than 3 pounds in 24 hours.  02/15/24 He states he is extremely short of breath with minimal activity.  Yesterday, he walked up a flight of steps at his home and at the top of the steps he was unable to catch his breath.  He has chronic wheezing and COPD.  His pulmonologist has recently had him on Cipro  for wheezing and coughing per his report.  Today on exam, he appears to be in rapid ventricular response with atrial fibrillation.  He also has expiratory wheezing in all 4 lung fields. Past Medical History:  Diagnosis Date   Asthma    Biliary colic 2014   COPD (chronic obstructive pulmonary disease) with chronic bronchitis (HCC)    Coronary artery calcification seen on CAT scan    Cyst of pancreas    1 cm (7/18), repeat ct in 12 months.     Hyperlipidemia    Hypertension    Obstructive sleep apnea    Renal disorder    Rhinitis    Sleep apnea    pt denies sleep apnea-lost weight   Past Surgical History:  Procedure Laterality Date   COLONOSCOPY  09/29/2014   LAPAROSCOPIC CHOLECYSTECTOMY SINGLE SITE WITH  INTRAOPERATIVE CHOLANGIOGRAM N/A 09/15/2019   Procedure: LAPAROSCOPIC CHOLECYSTECTOMY SINGLE SITE;  Surgeon: Karie Soda, MD;  Location: [facility];  Service: General;  Laterality: N/A;   LITHOTRIPSY  2000   for kidney stone   Current Outpatient Medications on File Prior to Visit  Medication Sig Dispense Refill   albuterol (ACCUNEB) 0.63 MG/3ML nebulizer solution Take 3 mLs (0.63 mg total) by nebulization every 6 (six) hours as needed for wheezing. 75 mL 2   albuterol (VENTOLIN HFA) 108 (90 Base) MCG/ACT inhaler INHALE 2 PUFFS BY MOUTH EVERY 4 HOURS AS NEEDED FOR WHEEZE OR FOR SHORTNESS OF BREATH 8.5 each 0   apixaban (ELIQUIS) 5 MG TABS tablet TAKE 1 TABLET BY MOUTH TWICE A DAY 180 tablet 0   Ascorbic Acid (VITAMIN C) 1000 MG tablet Take 1,000 mg by mouth 2 (two) times daily.     budesonide-formoterol (SYMBICORT) 160-4.5 MCG/ACT inhaler Inhale 2 puffs into the lungs in the morning and at bedtime. Generic medication 1 each 6   Cholecalciferol (VITAMIN D3) 125 MCG (5000 UT) TABS Take by mouth.     Coenzyme Q10 (CO Q-10) 100 MG CAPS Take 1 capsule by mouth daily.     FIBER COMPLETE PO Take 1 tablet by mouth daily.     furosemide (LASIX) 40 MG tablet TAKE 1 TABLET (40 MG TOTAL) BY MOUTH DAILY. STOP HCTZ 30 tablet 3   losartan (COZAAR) 50 MG tablet Take 1 tablet (50 mg total) by mouth daily. 90 tablet 3   metoprolol succinate (TOPROL-XL) 50 MG 24 hr tablet Take 1 tablet (50 mg total) by mouth daily. 90 tablet 3   montelukast (SINGULAIR) 10 MG tablet Take 1 tablet (10 mg total) by mouth daily. 90 tablet 2   pantoprazole (PROTONIX) 40 MG tablet Take 1 tablet (40 mg total) by mouth 2 (two) times daily. 180 tablet 2   predniSONE (DELTASONE) 20 MG tablet 3 tabs poqday 1-2, 2 tabs poqday 3-4, 1 tab poqday 5-6 12 tablet 0   Respiratory Therapy Supplies (FLUTTER) DEVI Use as directed 1 each 0   rosuvastatin (CRESTOR) 20 MG tablet TAKE 1 TABLET BY MOUTH EVERY DAY **D/C LIPITOR** 90 tablet 2   No current  facility-administered medications on file prior to visit.   Allergies  Allergen Reactions   Dust Mite Extract Other (See Comments)   Social History   Socioeconomic History   Marital status: Married    Spouse name: Not on file   Number of children: Not on file   Years of education: Not on file   Highest education level: Bachelor's degree (e.g., BA, AB, BS)  Occupational History   Not on file  Tobacco Use   Smoking status: Former    Current packs/day: 0.00    Average packs/day: 2.0 packs/day for 11.0 years (22.0 ttl pk-yrs)    Types: Cigarettes    Start date: 12/20/1963    Quit date: 12/19/1974    Years since quitting: 49.1   Smokeless tobacco: Never  Vaping Use   Vaping status: Never Used  Substance and Sexual Activity   Alcohol use: Not Currently    Alcohol/week: 1.0 standard drink of alcohol    Types: 1 Glasses of wine per week   Drug use: No   Sexual activity: Not on file  Other Topics Concern   Not on file  Social History Narrative   Not on file   Social Drivers of Health   Financial Resource Strain: Low Risk  (02/14/2024)   Overall Financial Resource Strain (CARDIA)    Difficulty of Paying Living Expenses: Not very hard  Food Insecurity: No Food Insecurity (02/14/2024)   Hunger Vital Sign    Worried About Running Out of Food in the Last Year: Never true    Ran Out of Food in the Last Year: Never true  Transportation Needs: No Transportation Needs (02/14/2024)   PRAPARE - Administrator, Civil Service (Medical): No    Lack of Transportation (Non-Medical): No  Physical Activity: Insufficiently Active (02/14/2024)   Exercise Vital Sign    Days of Exercise per Week: 1 day    Minutes of Exercise per Session: 20 min  Stress: No Stress Concern Present (02/14/2024)   [employer] of Occupational Health - Occupational Stress Questionnaire    Feeling of Stress : Not at all  Social Connections: Socially Integrated (02/14/2024)   Social Connection and  Isolation Panel [NHANES]    Frequency of Communication with Friends and Family: More than three times a week    Frequency of Social Gatherings with Friends and Family: More than three times a week    Attends Religious Services: More than 4 times per year    Active Member of [employer] or Organizations: Yes    Attends Banker Meetings: More than 4 times per year    Marital Status: Married  Catering manager Violence: Not At Risk (11/23/2022)   Humiliation, Afraid, Rape, and Kick questionnaire    Fear of Current or Ex-Partner: No    Emotionally Abused: No    Physically Abused: No    Sexually Abused: No      Review of Systems  Respiratory:  Positive for cough.   All other systems reviewed and are negative.      Objective:   Physical Exam Vitals reviewed.  Constitutional:      Appearance: He is well-developed.  HENT:     Head: Normocephalic and atraumatic.     Nose: Nose normal.     Mouth/Throat:     Pharynx: No oropharyngeal exudate.  Neck:     Vascular: No carotid bruit.  Cardiovascular:     Rate and Rhythm: Tachycardia present. Rhythm irregular.     Heart sounds: Normal heart sounds.  Pulmonary:     Effort: Pulmonary effort is normal. No tachypnea or respiratory distress.     Breath sounds: No stridor or decreased air movement. Wheezing, rhonchi and rales present.    Musculoskeletal:     Right lower leg: Edema present.     Left lower leg: Edema present.           Assessment & Plan:  COPD (chronic obstructive pulmonary disease) with  chronic bronchitis (HCC) - Plan: CBC with Differential/Platelet, Pro b natriuretic peptide (BNP)  Shortness of breath - Plan: CBC with Differential/Platelet, Pro b natriuretic peptide (BNP), EKG 12-Lead  Persistent atrial fibrillation (HCC)  Patient has known COPD and is certainly wheezing on exam today.  However his exam is most significant for profound tachycardia which I believe represents rapid ventricular response.  I  believe this may be causing more of his shortness of breath at his COPD.  Therefore I will get an EKG to confirm my suspicions.  Patient denies any angina or chest pain.  Recent echocardiogram revealed no evidence of systolic heart failure. EKG confirms atrial fibrillation with rapid ventricular response.  Patient has nonspecific ST changes in lead I and aVL with T wave inversions in aVL.  These ST changes in the lateral leads seem to be a chronic finding on his previous EKG from January.  However his heart rate today is 151 bpm and I believe is solely responsible for his shortness of breath.  Patient is already on metoprolol.  He has been using albuterol extensively over the last few days due to shortness of breath which may have exacerbated this.  Therefore I recommended that he go to the emergency room for IV metoprolol versus diltiazem to try to slow his heart rate down where he can be monitored.  We will notify the emergency room

## 2024-02-16 ENCOUNTER — Encounter (HOSPITAL_COMMUNITY): Payer: Self-pay | Admitting: Internal Medicine

## 2024-02-16 DIAGNOSIS — Z823 Family history of stroke: Secondary | ICD-10-CM | POA: Diagnosis not present

## 2024-02-16 DIAGNOSIS — D649 Anemia, unspecified: Secondary | ICD-10-CM | POA: Diagnosis not present

## 2024-02-16 DIAGNOSIS — I4891 Unspecified atrial fibrillation: Secondary | ICD-10-CM | POA: Diagnosis not present

## 2024-02-16 DIAGNOSIS — I4819 Other persistent atrial fibrillation: Secondary | ICD-10-CM | POA: Diagnosis not present

## 2024-02-16 DIAGNOSIS — Z87891 Personal history of nicotine dependence: Secondary | ICD-10-CM | POA: Diagnosis not present

## 2024-02-16 DIAGNOSIS — Z79899 Other long term (current) drug therapy: Secondary | ICD-10-CM | POA: Diagnosis not present

## 2024-02-16 DIAGNOSIS — Z6834 Body mass index (BMI) 34.0-34.9, adult: Secondary | ICD-10-CM | POA: Diagnosis not present

## 2024-02-16 DIAGNOSIS — Z8261 Family history of arthritis: Secondary | ICD-10-CM | POA: Diagnosis not present

## 2024-02-16 DIAGNOSIS — G4733 Obstructive sleep apnea (adult) (pediatric): Secondary | ICD-10-CM | POA: Diagnosis not present

## 2024-02-16 DIAGNOSIS — J479 Bronchiectasis, uncomplicated: Secondary | ICD-10-CM | POA: Diagnosis not present

## 2024-02-16 DIAGNOSIS — I10 Essential (primary) hypertension: Secondary | ICD-10-CM | POA: Diagnosis not present

## 2024-02-16 DIAGNOSIS — I2510 Atherosclerotic heart disease of native coronary artery without angina pectoris: Secondary | ICD-10-CM | POA: Diagnosis not present

## 2024-02-16 DIAGNOSIS — E66812 Obesity, class 2: Secondary | ICD-10-CM | POA: Diagnosis not present

## 2024-02-16 DIAGNOSIS — E785 Hyperlipidemia, unspecified: Secondary | ICD-10-CM | POA: Diagnosis not present

## 2024-02-16 DIAGNOSIS — J449 Chronic obstructive pulmonary disease, unspecified: Secondary | ICD-10-CM | POA: Diagnosis not present

## 2024-02-16 DIAGNOSIS — Z7951 Long term (current) use of inhaled steroids: Secondary | ICD-10-CM | POA: Diagnosis not present

## 2024-02-16 DIAGNOSIS — Z7901 Long term (current) use of anticoagulants: Secondary | ICD-10-CM | POA: Diagnosis not present

## 2024-02-16 LAB — BASIC METABOLIC PANEL
Anion gap: 6 (ref 5–15)
BUN: 29 mg/dL — ABNORMAL HIGH (ref 8–23)
CO2: 24 mmol/L (ref 22–32)
Calcium: 8.7 mg/dL — ABNORMAL LOW (ref 8.9–10.3)
Chloride: 108 mmol/L (ref 98–111)
Creatinine, Ser: 0.94 mg/dL (ref 0.61–1.24)
GFR, Estimated: >60 mL/min (ref >60)
Glucose, Bld: 83 mg/dL (ref 70–99)
Potassium: 4.1 mmol/L (ref 3.5–5.1)
Sodium: 138 mmol/L (ref 135–145)

## 2024-02-16 LAB — CBC
HCT: 33.9 % — ABNORMAL LOW (ref 39.0–52.0)
Hemoglobin: 10.6 g/dL — ABNORMAL LOW (ref 13.0–17.0)
MCH: 31.4 pg (ref 26.0–34.0)
MCHC: 31.3 g/dL (ref 30.0–36.0)
MCV: 100.3 fL — ABNORMAL HIGH (ref 80.0–100.0)
Platelets: 325 10*3/uL (ref 150–400)
RBC: 3.38 MIL/uL — ABNORMAL LOW (ref 4.22–5.81)
RDW: 15.8 % — ABNORMAL HIGH (ref 11.5–15.5)
WBC: 10.5 10*3/uL (ref 4.0–10.5)
nRBC: 0 % (ref 0.0–0.2)

## 2024-02-16 LAB — TSH: TSH: 1.691 u[IU]/mL (ref 0.350–4.500)

## 2024-02-16 MED ORDER — ACETAMINOPHEN 500 MG PO TABS
1000.0000 mg | ORAL_TABLET | Freq: Three times a day (TID) | ORAL | Status: DC
  Administered 2024-02-16 – 2024-02-18 (×6): 1000 mg via ORAL
  Filled 2024-02-16 (×6): qty 2

## 2024-02-16 MED ORDER — TRAMADOL HCL 50 MG PO TABS
50.0000 mg | ORAL_TABLET | Freq: Three times a day (TID) | ORAL | Status: DC | PRN
  Administered 2024-02-17 – 2024-02-18 (×4): 50 mg via ORAL
  Filled 2024-02-16 (×4): qty 1

## 2024-02-16 MED ORDER — METOPROLOL SUCCINATE ER 100 MG PO TB24
100.0000 mg | ORAL_TABLET | Freq: Every day | ORAL | Status: DC
  Administered 2024-02-16: 100 mg via ORAL
  Filled 2024-02-16: qty 1

## 2024-02-16 MED ORDER — METHOCARBAMOL 500 MG PO TABS
500.0000 mg | ORAL_TABLET | Freq: Four times a day (QID) | ORAL | Status: DC | PRN
  Administered 2024-02-16 – 2024-02-18 (×5): 500 mg via ORAL
  Filled 2024-02-16 (×5): qty 1

## 2024-02-16 MED ORDER — KETOROLAC TROMETHAMINE 15 MG/ML IJ SOLN
15.0000 mg | Freq: Once | INTRAMUSCULAR | Status: AC
  Administered 2024-02-16: 15 mg via INTRAVENOUS
  Filled 2024-02-16: qty 1

## 2024-02-16 MED ORDER — CIPROFLOXACIN HCL 500 MG PO TABS
500.0000 mg | ORAL_TABLET | Freq: Two times a day (BID) | ORAL | Status: DC
  Administered 2024-02-16 – 2024-02-18 (×5): 500 mg via ORAL
  Filled 2024-02-16 (×5): qty 1

## 2024-02-16 MED ORDER — METOPROLOL TARTRATE 5 MG/5ML IV SOLN
2.5000 mg | INTRAVENOUS | Status: AC | PRN
Start: 2024-02-16 — End: 2024-02-16
  Administered 2024-02-16 (×2): 2.5 mg via INTRAVENOUS
  Filled 2024-02-16 (×2): qty 5

## 2024-02-16 MED ORDER — DM-GUAIFENESIN ER 30-600 MG PO TB12
1.0000 | ORAL_TABLET | Freq: Two times a day (BID) | ORAL | Status: DC | PRN
  Administered 2024-02-16 – 2024-02-18 (×5): 1 via ORAL
  Filled 2024-02-16 (×5): qty 1

## 2024-02-16 MED ORDER — METOPROLOL TARTRATE 50 MG PO TABS
50.0000 mg | ORAL_TABLET | Freq: Once | ORAL | Status: AC
  Administered 2024-02-16: 50 mg via ORAL
  Filled 2024-02-16: qty 1

## 2024-02-16 MED ORDER — METOPROLOL SUCCINATE ER 50 MG PO TB24
150.0000 mg | ORAL_TABLET | Freq: Every day | ORAL | Status: DC
  Administered 2024-02-17: 150 mg via ORAL
  Filled 2024-02-16: qty 1

## 2024-02-16 MED ORDER — METOPROLOL TARTRATE 5 MG/5ML IV SOLN
5.0000 mg | INTRAVENOUS | Status: AC
  Administered 2024-02-16: 5 mg via INTRAVENOUS
  Filled 2024-02-16: qty 5

## 2024-02-16 NOTE — TOC CM/SW Note (Signed)
 Transition of Care [facility]) - Inpatient Brief Assessment   Patient Details  Name: Mario [name] MRN: [id number] Date of Birth: 1946/05/25  Transition of Care [facility] CM/SW Contact:    Larrie Kass, LCSW Phone Number: 02/16/2024, 11:42 AM     Transition of Care Asessment: Insurance and Status: Insurance coverage has been reviewed Patient has primary care physician: Yes Home environment has been reviewed: home with spouse Prior level of function:: independent Prior/Current Home Services: No current home services Social Drivers of Health Review: SDOH reviewed no interventions necessary Readmission risk has been reviewed: Yes Transition of care needs: no transition of care needs at this time

## 2024-02-16 NOTE — Progress Notes (Signed)
 Patient's family is visiting patient and has questions/concerns about his hospitalization. Attending MD requested to come bedside to meet with family. Attending MD met with family and addressed questions/concerns.

## 2024-02-16 NOTE — Progress Notes (Signed)
 PROGRESS NOTE    Mario [name]  ZOX:[id number] DOB: [birth year] DOA: 02/15/2024 PCP: Donita Brooks, MD    Brief Narrative:   Mario [name] is a 78 y.o. male with past medical history significant for HTN, HLD, persistent atrial fibrillation on Eliquis, chronic bronchiectasis who presented to [facility] ED on 02/15/2024 with progressive dyspnea.  He initially was seen by his PCP in the morning, at their office he was noted to be in rapid A-fib and sent to the ED for further evaluation.  Patient denies any fevers, no chills, no nausea/vomiting/diarrhea, no chest pain, no syncope or lightheadedness.  He reports is overall compliant with his medications but occasionally may miss a dose of Eliquis here and there.  In the ED, temperature 98.5 F, HR 154, RR 16, BP 107/76, SpO2 97% on room air.  WBC 12.5, hemoglobin 11.3, platelet count 360.  Sodium 138, potassium 3.9, chloride 106, CO2 22, glucose 97, BUN 30, creatinine 1.09.  Chest x-ray with stable left retrocardiac opacity, no acute cardiopulmonary disease process.  Patient was placed on IV Cardizem drip, with improvement of heart rate although his systolic blood pressure dropped into the 90s and was subsequently discontinued.  TRH consulted for admission for further evaluation management of A-fib with RVR.  Assessment & Plan:   Persistent atrial fibrillation with RVR Patient presenting to the ED with progressive exertional dyspnea, found to be in A-fib with RVR with heart rates in the 150s.  Initially placed on Cardizem drip with improvement although also became slightly hypotensive and Cardizem drip was discontinued.  At baseline on metoprolol succinate 50 mg p.o. daily.  TSH within normal limits.  Potentially worsen with home use of albuterol.  Follows with cardiology outpatient, Dr. Allyson Sabal.  -- Will give additional dose of metoprolol tartrate 50 mg at 1800 today -- Increase metoprolol succinate to 150 mg p.o. daily starting  tomorrow -- Continue Eliquis 5 mg p.o. twice daily -- Continue monitor on telemetry -- Check electrolytes in a.m.  Musculoskeletal back pain Patient complains of pain to his lumbar paraspinal region bilaterally.  No history of recent trauma.  Has had side effect with tizanidine in the past. -- Robaxin 500 mg p.o. every 6 hours as needed muscle spasms -- Tylenol as needed mild pain  Essential hypertension -- Metoprolol succinate increased to 150 mg p.o. daily as above -- Holding home losartan to allow adjustments of beta-blocker -- Continue furosemide 40 mg p.o. daily  Hyperlipidemia -- Continue Crestor 20 mg p.o. daily  Chronic bronchiectasis -- On Symbicort outpatient, substituted with Dulera 2 puffs twice daily while inpatient -- Continue ciprofloxacin 500 mg p.o. twice daily for 10-day course started outpatient -- Singulair 10 mg p.o. daily -- Xopenex every 6 hours as needed wheezing/shortness of breath  Obesity, class II Body mass index is 34.71 kg/m.    DVT prophylaxis:  apixaban (ELIQUIS) tablet 5 mg    Code Status: Full Code Family Communication: No family present bedside this morning  Disposition Plan:  Level of care: Telemetry Status is: Inpatient Remains inpatient appropriate because: Needs further rate control of A-fib with RVR, still heart rate greater than 100    Consultants:  None  Procedures:  None  Antimicrobials:  None   Subjective: Patient seen examined bedside, sitting in bedside chair.  Only complaint is musculoskeletal pain in his lumbar area.  Denies any trauma.  Heart rate remains poorly controlled, will uptitrate metoprolol today.  Also will try Robaxin for his musculoskeletal pain  likely related to muscle spasms in his paraspinal lumbar muscles.  Patient denies headache, no dizziness, no chest pain, no palpitations, no shortness of breath, no abdominal pain, no fever/chills/night sweats, no nausea/vomiting/diarrhea, no focal weakness, no  fatigue, no paresthesias.  No acute events overnight per nursing staff.  Objective: Vitals:   02/16/24 0520 02/16/24 0536 02/16/24 0803 02/16/24 1035  BP:  133/86 (!) 146/110 118/84  Pulse:  (!) 105 (!) 124 (!) 108  Resp: 12 14    Temp:  97.6 F (36.4 C) 97.7 F (36.5 C) 98 F (36.7 C)  TempSrc:  Oral Oral Oral  SpO2:  95% 97% 96%  Weight:      Height:        Intake/Output Summary (Last 24 hours) at 02/16/2024 1532 Last data filed at 02/16/2024 0417 Gross per 24 hour  Intake 762.99 ml  Output --  Net 762.99 ml   Filed Weights   02/15/24 2213  Weight: 106.6 kg    Examination:  Physical Exam: GEN: NAD, alert and oriented x 3, obese HEENT: NCAT, PERRL, EOMI, sclera clear, MMM PULM: CTAB w/o wheezes/crackles, normal respiratory effort, on room air CV: Irregularly irregular rhythm, tachycardic, w/o M/G/R GI: abd soft, NTND, + BS MSK: no peripheral edema, muscle strength globally intact 5/5 bilateral upper/lower extremities NEURO: CN II-XII intact, no focal deficits, sensation to light touch intact PSYCH: normal mood/affect Integumentary: dry/intact, no rashes or wounds    Data Reviewed: I have personally reviewed following labs and imaging studies  CBC: Recent Labs  Lab 02/15/24 1354 02/16/24 0350  WBC 12.5* 10.5  HGB 11.3* 10.6*  HCT 35.9* 33.9*  MCV 99.2 100.3*  PLT 360 325   Basic Metabolic Panel: Recent Labs  Lab 02/15/24 1354 02/16/24 0350  NA 138 138  K 3.9 4.1  CL 106 108  CO2 22 24  GLUCOSE 97 83  BUN 30* 29*  CREATININE 1.09 0.94  CALCIUM 8.9 8.7*  MG 2.0  --    GFR: Estimated Creatinine Clearance: 78 mL/min (by C-G formula based on SCr of 0.94 mg/dL). Liver Function Tests: No results for input(s): "AST", "ALT", "ALKPHOS", "BILITOT", "PROT", "ALBUMIN" in the last 168 hours. No results for input(s): "LIPASE", "AMYLASE" in the last 168 hours. No results for input(s): "AMMONIA" in the last 168 hours. Coagulation Profile: No results for  input(s): "INR", "PROTIME" in the last 168 hours. Cardiac Enzymes: No results for input(s): "CKTOTAL", "CKMB", "CKMBINDEX", "TROPONINI" in the last 168 hours. BNP (last 3 results) No results for input(s): "PROBNP" in the last 8760 hours. HbA1C: No results for input(s): "HGBA1C" in the last 72 hours. CBG: No results for input(s): "GLUCAP" in the last 168 hours. Lipid Profile: No results for input(s): "CHOL", "HDL", "LDLCALC", "TRIG", "CHOLHDL", "LDLDIRECT" in the last 72 hours. Thyroid Function Tests: Recent Labs    02/16/24 0350  TSH 1.691   Anemia Panel: No results for input(s): "VITAMINB12", "FOLATE", "FERRITIN", "TIBC", "IRON", "RETICCTPCT" in the last 72 hours. Sepsis Labs: No results for input(s): "PROCALCITON", "LATICACIDVEN" in the last 168 hours.  No results found for this or any previous visit (from the past 240 hours).       Radiology Studies: DG Chest Port 1 View Result Date: 02/15/2024 CLINICAL DATA:  Dyspnea. EXAM: PORTABLE CHEST 1 VIEW COMPARISON:  03/23/2023. FINDINGS: Stable left retrocardiac opacity, likely obscuring the left costophrenic angle. Findings may represent combination of left lung atelectasis and/or consolidation with probable pleural effusion versus epicardial fat pad. Bilateral lung fields are otherwise  clear. No acute consolidation or lung collapse. Bilateral costophrenic angles are clear. Stable cardio-mediastinal silhouette. No acute osseous abnormalities. The soft tissues are within normal limits. IMPRESSION: *Stable left retrocardiac opacity, as described above. No acute cardiopulmonary abnormality. Electronically Signed   By: Jules Schick M.D.   On: 02/15/2024 14:28        Scheduled Meds:  apixaban  5 mg Oral BID   ciprofloxacin  500 mg Oral BID   furosemide  40 mg Oral Daily   [START ON 02/17/2024] metoprolol succinate  150 mg Oral Daily   metoprolol tartrate  50 mg Oral Once   mometasone-formoterol  2 puff Inhalation BID   montelukast   10 mg Oral Daily   rosuvastatin  20 mg Oral Daily   Continuous Infusions:   LOS: 0 days    Time spent: 56 minutes spent on chart review, discussion with nursing staff, consultants, updating family and interview/physical exam; more than 50% of that time was spent in counseling and/or coordination of care.    Alvira Philips [place], DO [facility] Available via Epic secure chat 7am-7pm After these hours, please refer to coverage provider listed on [email or web address] 02/16/2024, 3:32 PM

## 2024-02-16 NOTE — Plan of Care (Signed)

## 2024-02-16 NOTE — Plan of Care (Signed)
   Problem: Clinical Measurements: Goal: Ability to maintain clinical measurements within normal limits will improve Outcome: Progressing   Problem: Clinical Measurements: Goal: Will remain free from infection Outcome: Progressing   Problem: Clinical Measurements: Goal: Respiratory complications will improve Outcome: Progressing   Problem: Clinical Measurements: Goal: Cardiovascular complication will be avoided Outcome: Progressing

## 2024-02-16 NOTE — Care Management Obs Status (Signed)
 MEDICARE OBSERVATION STATUS NOTIFICATION   Patient Details  Name: Mario [name] MRN: [id number] Date of Birth: 1946-10-29   Medicare Observation Status Notification Given:  Yes    Larrie Kass, LCSW 02/16/2024, 11:42 AM

## 2024-02-17 DIAGNOSIS — I4891 Unspecified atrial fibrillation: Secondary | ICD-10-CM | POA: Diagnosis not present

## 2024-02-17 LAB — MAGNESIUM: Magnesium: 2.2 mg/dL (ref 1.7–2.4)

## 2024-02-17 LAB — BASIC METABOLIC PANEL
Anion gap: 8 (ref 5–15)
BUN: 32 mg/dL — ABNORMAL HIGH (ref 8–23)
CO2: 25 mmol/L (ref 22–32)
Calcium: 9 mg/dL (ref 8.9–10.3)
Chloride: 104 mmol/L (ref 98–111)
Creatinine, Ser: 0.79 mg/dL (ref 0.61–1.24)
GFR, Estimated: >60 mL/min (ref >60)
Glucose, Bld: 94 mg/dL (ref 70–99)
Potassium: 4.6 mmol/L (ref 3.5–5.1)
Sodium: 137 mmol/L (ref 135–145)

## 2024-02-17 MED ORDER — METOPROLOL TARTRATE 5 MG/5ML IV SOLN
5.0000 mg | Freq: Once | INTRAVENOUS | Status: AC
  Administered 2024-02-17: 5 mg via INTRAVENOUS
  Filled 2024-02-17: qty 5

## 2024-02-17 MED ORDER — METOPROLOL SUCCINATE ER 100 MG PO TB24
200.0000 mg | ORAL_TABLET | Freq: Every day | ORAL | Status: DC
  Administered 2024-02-18: 200 mg via ORAL
  Filled 2024-02-17: qty 2

## 2024-02-17 MED ORDER — METOPROLOL TARTRATE 50 MG PO TABS
50.0000 mg | ORAL_TABLET | Freq: Once | ORAL | Status: AC
  Administered 2024-02-17: 50 mg via ORAL
  Filled 2024-02-17: qty 1

## 2024-02-17 NOTE — Progress Notes (Signed)
 PROGRESS NOTE    Mario [name]  WUJ:[id number] DOB: [birth year] DOA: 02/15/2024 PCP: Donita Brooks, MD    Brief Narrative:   Mario [name] is a 78 y.o. male with past medical history significant for HTN, HLD, persistent atrial fibrillation on Eliquis, chronic bronchiectasis who presented to [facility] ED on 02/15/2024 with progressive dyspnea.  He initially was seen by his PCP in the morning, at their office he was noted to be in rapid A-fib and sent to the ED for further evaluation.  Patient denies any fevers, no chills, no nausea/vomiting/diarrhea, no chest pain, no syncope or lightheadedness.  He reports is overall compliant with his medications but occasionally may miss a dose of Eliquis here and there.  In the ED, temperature 98.5 F, HR 154, RR 16, BP 107/76, SpO2 97% on room air.  WBC 12.5, hemoglobin 11.3, platelet count 360.  Sodium 138, potassium 3.9, chloride 106, CO2 22, glucose 97, BUN 30, creatinine 1.09.  Chest x-ray with stable left retrocardiac opacity, no acute cardiopulmonary disease process.  Patient was placed on IV Cardizem drip, with improvement of heart rate although his systolic blood pressure dropped into the 90s and was subsequently discontinued.  TRH consulted for admission for further evaluation management of A-fib with RVR.  Assessment & Plan:   Persistent atrial fibrillation with RVR Patient presenting to the ED with progressive exertional dyspnea, found to be in A-fib with RVR with heart rates in the 150s.  Initially placed on Cardizem drip with improvement although also became slightly hypotensive and Cardizem drip was discontinued.  At baseline on metoprolol succinate 50 mg p.o. daily.  TSH within normal limits.  Potentially worsen with home use of albuterol.  Follows with cardiology outpatient, Dr. Allyson Sabal.  -- Will give additional dose of metoprolol tartrate 50 mg at 1600 today -- Increase metoprolol succinate to 200 mg p.o. daily starting  tomorrow -- Continue Eliquis 5 mg p.o. twice daily -- Continue monitor on telemetry  Musculoskeletal back pain Patient complains of pain to his lumbar paraspinal region bilaterally.  No history of recent trauma.  Has had side effect with tizanidine in the past. -- Robaxin 500 mg p.o. every 6 hours as needed muscle spasms -- Tylenol as needed mild pain  Essential hypertension -- Metoprolol succinate increased to 200 mg p.o. daily as above -- Holding home losartan to allow adjustments of beta-blocker -- Continue furosemide 40 mg p.o. daily  Hyperlipidemia -- Continue Crestor 20 mg p.o. daily  Chronic bronchiectasis -- On Symbicort outpatient, substituted with Dulera 2 puffs twice daily while inpatient -- Continue ciprofloxacin 500 mg p.o. twice daily for 10-day course started outpatient -- Singulair 10 mg p.o. daily -- Xopenex every 6 hours as needed wheezing/shortness of breath  Obesity, class II Body mass index is 34.71 kg/m.    DVT prophylaxis:  apixaban (ELIQUIS) tablet 5 mg    Code Status: Full Code Family Communication: No family present bedside this morning  Disposition Plan:  Level of care: Telemetry Status is: Inpatient Remains inpatient appropriate because: Needs further rate control of A-fib with RVR, still heart rate greater than 100    Consultants:  None  Procedures:  None  Antimicrobials:  None   Subjective: Patient seen examined bedside, sitting in bedside chair.  Continues with elevated heart rate, rates 110-130s.  Discussed with patient will increase his metoprolol up to 200 mg p.o. daily starting tomorrow and will receive an additional 50 mg this afternoon.  Patient denies headache, no  dizziness, no chest pain, no palpitations, no shortness of breath, no abdominal pain, no fever/chills/night sweats, no nausea/vomiting/diarrhea, no focal weakness, no fatigue, no paresthesias.  No acute events overnight per nursing staff.  Objective: Vitals:    02/16/24 1550 02/16/24 2048 02/17/24 0443 02/17/24 0736  BP: 120/81 (!) 123/92 (!) 136/91 113/86  Pulse: 62 89 (!) 102 (!) 105  Resp: 20 18 19    Temp: 97.7 F (36.5 C) 98.4 F (36.9 C) 97.7 F (36.5 C) 97.8 F (36.6 C)  TempSrc: Oral Oral Oral Oral  SpO2: 97% 96% 94% 95%  Weight:      Height:        Intake/Output Summary (Last 24 hours) at 02/17/2024 1244 Last data filed at 02/17/2024 0825 Gross per 24 hour  Intake 240 ml  Output --  Net 240 ml   Filed Weights   02/15/24 2213  Weight: 106.6 kg    Examination:  Physical Exam: GEN: NAD, alert and oriented x 3, obese HEENT: NCAT, PERRL, EOMI, sclera clear, MMM PULM: CTAB w/o wheezes/crackles, normal respiratory effort, on room air CV: Irregularly irregular rhythm, tachycardic, w/o M/G/R GI: abd soft, NTND, + BS MSK: no peripheral edema, muscle strength globally intact 5/5 bilateral upper/lower extremities NEURO: CN II-XII intact, no focal deficits, sensation to light touch intact PSYCH: normal mood/affect Integumentary: dry/intact, no rashes or wounds    Data Reviewed: I have personally reviewed following labs and imaging studies  CBC: Recent Labs  Lab 02/15/24 1354 02/16/24 0350  WBC 12.5* 10.5  HGB 11.3* 10.6*  HCT 35.9* 33.9*  MCV 99.2 100.3*  PLT 360 325   Basic Metabolic Panel: Recent Labs  Lab 02/15/24 1354 02/16/24 0350 02/17/24 0429  NA 138 138 137  K 3.9 4.1 4.6  CL 106 108 104  CO2 22 24 25   GLUCOSE 97 83 94  BUN 30* 29* 32*  CREATININE 1.09 0.94 0.79  CALCIUM 8.9 8.7* 9.0  MG 2.0  --  2.2   GFR: Estimated Creatinine Clearance: 91.6 mL/min (by C-G formula based on SCr of 0.79 mg/dL). Liver Function Tests: No results for input(s): "AST", "ALT", "ALKPHOS", "BILITOT", "PROT", "ALBUMIN" in the last 168 hours. No results for input(s): "LIPASE", "AMYLASE" in the last 168 hours. No results for input(s): "AMMONIA" in the last 168 hours. Coagulation Profile: No results for input(s): "INR",  "PROTIME" in the last 168 hours. Cardiac Enzymes: No results for input(s): "CKTOTAL", "CKMB", "CKMBINDEX", "TROPONINI" in the last 168 hours. BNP (last 3 results) No results for input(s): "PROBNP" in the last 8760 hours. HbA1C: No results for input(s): "HGBA1C" in the last 72 hours. CBG: No results for input(s): "GLUCAP" in the last 168 hours. Lipid Profile: No results for input(s): "CHOL", "HDL", "LDLCALC", "TRIG", "CHOLHDL", "LDLDIRECT" in the last 72 hours. Thyroid Function Tests: Recent Labs    02/16/24 0350  TSH 1.691   Anemia Panel: No results for input(s): "VITAMINB12", "FOLATE", "FERRITIN", "TIBC", "IRON", "RETICCTPCT" in the last 72 hours. Sepsis Labs: No results for input(s): "PROCALCITON", "LATICACIDVEN" in the last 168 hours.  No results found for this or any previous visit (from the past 240 hours).       Radiology Studies: No results found.       Scheduled Meds:  acetaminophen  1,000 mg Oral TID   apixaban  5 mg Oral BID   ciprofloxacin  500 mg Oral BID   furosemide  40 mg Oral Daily   [START ON 02/18/2024] metoprolol succinate  200 mg Oral Daily  metoprolol tartrate  50 mg Oral Once   mometasone-formoterol  2 puff Inhalation BID   montelukast  10 mg Oral Daily   rosuvastatin  20 mg Oral Daily   Continuous Infusions:   LOS: 1 day    Time spent: 56 minutes spent on chart review, discussion with nursing staff, consultants, updating family and interview/physical exam; more than 50% of that time was spent in counseling and/or coordination of care.    Alvira Philips [place], DO [facility] Available via Epic secure chat 7am-7pm After these hours, please refer to coverage provider listed on [email or web address] 02/17/2024, 12:44 PM

## 2024-02-17 NOTE — Progress Notes (Signed)
 Mobility Specialist - Progress Note  Pre-mobility: 106 bpm HR,  During mobility: 144 bpm HR,  Post-mobility: 105 bpm HR,   02/17/24 0954  Mobility  Activity Ambulated with assistance in hallway  Level of Assistance Modified independent, requires aide device or extra time  Assistive Device Front wheel walker  Distance Ambulated (ft) 250 ft  Range of Motion/Exercises Active  Activity Response Tolerated well  Mobility Referral Yes  Mobility visit 1 Mobility  Mobility Specialist Start Time (ACUTE ONLY) 0944  Mobility Specialist Stop Time (ACUTE ONLY) 0954  Mobility Specialist Time Calculation (min) (ACUTE ONLY) 10 min   Pt was found on recliner chair and agreeable to ambulate. Had x1 breif standing rest break due to pt stated feeling a little unsteady. At EOS returned to recliner chair with all needs met. Call bell in reach.  Billey Chang Mobility Specialist

## 2024-02-17 NOTE — Plan of Care (Signed)
  Problem: Clinical Measurements: Goal: Will remain free from infection Outcome: Progressing   Problem: Clinical Measurements: Goal: Ability to maintain clinical measurements within normal limits will improve Outcome: Progressing   Problem: Clinical Measurements: Goal: Respiratory complications will improve Outcome: Progressing   Problem: Clinical Measurements: Goal: Cardiovascular complication will be avoided Outcome: Progressing

## 2024-02-18 DIAGNOSIS — I4891 Unspecified atrial fibrillation: Secondary | ICD-10-CM | POA: Diagnosis not present

## 2024-02-18 MED ORDER — METOPROLOL SUCCINATE ER 200 MG PO TB24: 200.0000 mg | ORAL_TABLET | Freq: Every day | ORAL | 0 refills | Status: DC

## 2024-02-18 MED ORDER — METHOCARBAMOL 500 MG PO TABS: 500.0000 mg | ORAL_TABLET | Freq: Four times a day (QID) | ORAL | 0 refills | Status: DC | PRN

## 2024-02-18 MED ORDER — LEVALBUTEROL HCL 0.63 MG/3ML IN NEBU: 0.6300 mg | INHALATION_SOLUTION | RESPIRATORY_TRACT | 2 refills | Status: DC | PRN

## 2024-02-18 MED ORDER — TRAMADOL HCL 50 MG PO TABS: 50.0000 mg | ORAL_TABLET | Freq: Three times a day (TID) | ORAL | 0 refills | Status: DC | PRN

## 2024-02-18 NOTE — Discharge Summary (Signed)
 Physician Discharge Summary  Mario [name]:[id number] DOB: 1946-05-13 DOA: 02/15/2024  PCP: Donita Brooks, MD  Admit date: 02/15/2024 Discharge date: 02/18/2024  Admitted From: Home Disposition: Home  Recommendations for Outpatient Follow-up:  Follow up with PCP in 1-2 weeks Follow-up with cardiology, Dr. Gery Pray in 1-2 weeks Metoprolol succinate increased to 200 mg p.o. daily for A-fib with RVR Discontinue losartan to allow up titration of metoprolol Discontinued albuterol as likely precipitating factor to his rapid ventricular response, substituted with Xopenex  Home Health: No Equipment/Devices: None  Discharge Condition: Stable CODE STATUS: Full code Diet recommendation: Heart healthy diet  History of present illness:  Mario [name] is a 78 y.o. male with past medical history significant for HTN, HLD, persistent atrial fibrillation on Eliquis, chronic bronchiectasis who presented to [facility] ED on 02/15/2024 with progressive dyspnea.  He initially was seen by his PCP in the morning, at their office he was noted to be in rapid A-fib and sent to the ED for further evaluation.  Patient denies any fevers, no chills, no nausea/vomiting/diarrhea, no chest pain, no syncope or lightheadedness.  He reports is overall compliant with his medications but occasionally may miss a dose of Eliquis here and there.   In the ED, temperature 98.5 F, HR 154, RR 16, BP 107/76, SpO2 97% on room air.  WBC 12.5, hemoglobin 11.3, platelet count 360.  Sodium 138, potassium 3.9, chloride 106, CO2 22, glucose 97, BUN 30, creatinine 1.09.  Chest x-ray with stable left retrocardiac opacity, no acute cardiopulmonary disease process.  Patient was placed on IV Cardizem drip, with improvement of heart rate although his systolic blood pressure dropped into the 90s and was subsequently discontinued.  TRH consulted for admission for further evaluation management of A-fib with RVR.  Hospital  course:  Persistent atrial fibrillation with RVR Patient presenting to the ED with progressive exertional dyspnea, found to be in A-fib with RVR with heart rates in the 150s.  Initially placed on Cardizem drip with improvement although also became slightly hypotensive and Cardizem drip was discontinued.  At baseline on metoprolol succinate 50 mg p.o. daily.  TSH within normal limits.  Potentially worsen with home use of albuterol.  Follows with cardiology outpatient, Dr. Allyson Sabal.  Metoprolol succinate was uptitrated to 200 mg p.o. daily.  Continue Eliquis 5 mg p.o. twice daily.  Outpatient follow-up with cardiology.   Musculoskeletal back pain Patient complains of pain to his lumbar paraspinal region bilaterally.  No history of recent trauma.  Has had side effect with tizanidine in the past.  Started on Robaxin and tramadol as needed.  Outpatient follow-up with orthopedics.   Essential hypertension Metoprolol succinate increased to 200 mg p.o. daily as above. Holding home losartan to allow adjustments of beta-blocker. Continue furosemide 40 mg p.o. daily   Hyperlipidemia Continue Crestor 20 mg p.o. daily   Chronic bronchiectasis Continue Symbicort, Singulair.  Albuterol discontinued in favor of Xopenex.  Continue ciprofloxacin as prescribed by outpatient provider for 10-day treatment of acute on chronic bronchiectasis.  Outpatient follow-up with pulmonology.   Obesity, class II Body mass index is 34.71 kg/m.   Discharge Diagnoses:  Principal Problem:   Atrial fibrillation with rapid ventricular response [facility])    Discharge Instructions  Discharge Instructions     Call MD for:  difficulty breathing, headache or visual disturbances   Complete by: As directed    Call MD for:  extreme fatigue   Complete by: As directed    Call MD  for:  persistant dizziness or light-headedness   Complete by: As directed    Call MD for:  persistant nausea and vomiting   Complete by: As directed    Call  MD for:  severe uncontrolled pain   Complete by: As directed    Call MD for:  temperature >100.4   Complete by: As directed    Diet - low sodium heart healthy   Complete by: As directed    Increase activity slowly   Complete by: As directed       Allergies as of 02/18/2024       Reactions   Dust Mite Extract Other (See Comments)        Medication List     STOP taking these medications    albuterol 0.63 MG/3ML nebulizer solution Commonly known as: ACCUNEB   albuterol 108 (90 Base) MCG/ACT inhaler Commonly known as: VENTOLIN HFA   losartan 50 MG tablet Commonly known as: COZAAR   predniSONE 20 MG tablet Commonly known as: DELTASONE   tiZANidine 2 MG tablet Commonly known as: ZANAFLEX       TAKE these medications    budesonide-formoterol 160-4.5 MCG/ACT inhaler Commonly known as: Symbicort Inhale 2 puffs into the lungs in the morning and at bedtime. Generic medication   ciprofloxacin 500 MG tablet Commonly known as: CIPRO Take 1 tablet by mouth 2 (two) times daily.   Co Q-10 100 MG Caps Take 1 capsule by mouth daily.   dorzolamide-timolol 2-0.5 % ophthalmic solution Commonly known as: COSOPT Place 1 drop into the right eye 2 (two) times daily.   Eliquis 5 MG Tabs tablet Generic drug: apixaban TAKE 1 TABLET BY MOUTH TWICE A DAY   FIBER COMPLETE PO Take 1 tablet by mouth daily.   Flutter Devi Use as directed   furosemide 40 MG tablet Commonly known as: LASIX TAKE 1 TABLET (40 MG TOTAL) BY MOUTH DAILY. STOP HCTZ   levalbuterol 0.63 MG/3ML nebulizer solution Commonly known as: XOPENEX Take 3 mLs (0.63 mg total) by nebulization every 4 (four) hours as needed for wheezing or shortness of breath.   methocarbamol 500 MG tablet Commonly known as: ROBAXIN Take 1 tablet (500 mg total) by mouth every 6 (six) hours as needed for muscle spasms.   metoprolol 200 MG 24 hr tablet Commonly known as: TOPROL-XL Take 1 tablet (200 mg total) by mouth daily.  Take with or immediately following a meal. Start taking on: February 19, 2024 What changed:  medication strength how much to take additional instructions   montelukast 10 MG tablet Commonly known as: SINGULAIR Take 1 tablet (10 mg total) by mouth daily.   pantoprazole 40 MG tablet Commonly known as: PROTONIX Take 1 tablet (40 mg total) by mouth 2 (two) times daily.   rosuvastatin 20 MG tablet Commonly known as: CRESTOR TAKE 1 TABLET BY MOUTH EVERY DAY **D/C LIPITOR**   traMADol 50 MG tablet Commonly known as: ULTRAM Take 1 tablet (50 mg total) by mouth every 8 (eight) hours as needed for moderate pain (pain score 4-6).   vitamin C 1000 MG tablet Take 1,000 mg by mouth 2 (two) times daily.   Vitamin D3 125 MCG (5000 UT) Tabs Take by mouth.        Follow-up Information     Donita Brooks, MD. Schedule an appointment as soon as possible for a visit in 1 week(s).   Specialty: Family Medicine Contact information: [address] [place] [address] [phone]  Runell Gess, MD. Schedule an appointment as soon as possible for a visit in 1 week(s).   Specialties: Cardiology, Radiology Contact information: [address] [place] [address] [phone]                Allergies  Allergen Reactions   Dust Mite Extract Other (See Comments)    Consultations: None   Procedures/Studies: DG Chest Port 1 View Result Date: 02/15/2024 CLINICAL DATA:  Dyspnea. EXAM: PORTABLE CHEST 1 VIEW COMPARISON:  03/23/2023. FINDINGS: Stable left retrocardiac opacity, likely obscuring the left costophrenic angle. Findings may represent combination of left lung atelectasis and/or consolidation with probable pleural effusion versus epicardial fat pad. Bilateral lung fields are otherwise clear. No acute consolidation or lung collapse. Bilateral costophrenic angles are clear. Stable cardio-mediastinal silhouette. No acute osseous abnormalities.  The soft tissues are within normal limits. IMPRESSION: *Stable left retrocardiac opacity, as described above. No acute cardiopulmonary abnormality. Electronically Signed   By: Jules Schick M.D.   On: 02/15/2024 14:28     Subjective: Patient seen examined bedside, sitting in bedside chair.  No complaints this morning.  Heart rate better controlled.  Ready for discharge home.  Discussed with patient needs close follow-up with his cardiologist.  Also discussed discontinuation of losartan to allow up titration of metoprolol.  Appreciative of the care he received in the hospital.  Denies headache, no dizziness, no chest pain, no palpitations, no shortness of breath, no abdominal pain, no fever/chills/night sweats, no nausea/vomiting/diarrhea, no focal weakness, no fatigue, no paresthesia.  No acute events overnight per nursing.  Discharge Exam: Vitals:   02/18/24 0922 02/18/24 0925  BP: 115/83 115/83  Pulse: (!) 101 (!) 104  Resp: 20   Temp: 97.9 F (36.6 C) 97.9 F (36.6 C)  SpO2: 95% 95%   Vitals:   02/17/24 2042 02/18/24 0625 02/18/24 0922 02/18/24 0925  BP: 118/88 (!) 127/92 115/83 115/83  Pulse: 92 94 (!) 101 (!) 104  Resp: 14 14 20    Temp: 98.4 F (36.9 C) 97.6 F (36.4 C) 97.9 F (36.6 C) 97.9 F (36.6 C)  TempSrc:  Oral Oral Oral  SpO2: 95% 96% 95% 95%  Weight:      Height:        Physical Exam: GEN: NAD, alert and oriented x 3, obese HEENT: NCAT, PERRL, EOMI, sclera clear, MMM PULM: CTAB w/o wheezes/crackles, normal respiratory effort, on room air CV: Irregularly irregular rhythm w/o M/G/R GI: abd soft, NTND, NABS, no R/G/M MSK: no peripheral edema, muscle strength globally intact 5/5 bilateral upper/lower extremities NEURO: CN II-XII intact, no focal deficits, sensation to light touch intact PSYCH: normal mood/affect Integumentary: dry/intact, no rashes or wounds    The results of significant diagnostics from this hospitalization (including imaging, microbiology,  ancillary and laboratory) are listed below for reference.     Microbiology: No results found for this or any previous visit (from the past 240 hours).   Labs: BNP (last 3 results) Recent Labs    09/11/23 1622 11/24/23 1622 02/15/24 1354  BNP 350* 107* 230.0*   Basic Metabolic Panel: Recent Labs  Lab 02/15/24 1354 02/16/24 0350 02/17/24 0429  NA 138 138 137  K 3.9 4.1 4.6  CL 106 108 104  CO2 22 24 25   GLUCOSE 97 83 94  BUN 30* 29* 32*  CREATININE 1.09 0.94 0.79  CALCIUM 8.9 8.7* 9.0  MG 2.0  --  2.2   Liver Function Tests: No results for input(s): "AST", "ALT", "ALKPHOS", "  BILITOT", "PROT", "ALBUMIN" in the last 168 hours. No results for input(s): "LIPASE", "AMYLASE" in the last 168 hours. No results for input(s): "AMMONIA" in the last 168 hours. CBC: Recent Labs  Lab 02/15/24 1354 02/16/24 0350  WBC 12.5* 10.5  HGB 11.3* 10.6*  HCT 35.9* 33.9*  MCV 99.2 100.3*  PLT 360 325   Cardiac Enzymes: No results for input(s): "CKTOTAL", "CKMB", "CKMBINDEX", "TROPONINI" in the last 168 hours. BNP: Invalid input(s): "POCBNP" CBG: No results for input(s): "GLUCAP" in the last 168 hours. D-Dimer No results for input(s): "DDIMER" in the last 72 hours. Hgb A1c No results for input(s): "HGBA1C" in the last 72 hours. Lipid Profile No results for input(s): "CHOL", "HDL", "LDLCALC", "TRIG", "CHOLHDL", "LDLDIRECT" in the last 72 hours. Thyroid function studies Recent Labs    02/16/24 0350  TSH 1.691   Anemia work up No results for input(s): "VITAMINB12", "FOLATE", "FERRITIN", "TIBC", "IRON", "RETICCTPCT" in the last 72 hours. Urinalysis    Component Value Date/Time   COLORURINE YELLOW 08/01/2022 0825   APPEARANCEUR CLEAR 08/01/2022 0825   LABSPEC 1.020 08/01/2022 0825   PHURINE 5.5 08/01/2022 0825   GLUCOSEU NEGATIVE 08/01/2022 0825   HGBUR NEGATIVE 08/01/2022 0825   BILIRUBINUR NEGATIVE 12/12/2012 0908   KETONESUR NEGATIVE 08/01/2022 0825   PROTEINUR NEGATIVE  08/01/2022 0825   UROBILINOGEN 1.0 12/12/2012 0908   NITRITE NEGATIVE 08/01/2022 0825   LEUKOCYTESUR NEGATIVE 08/01/2022 0825   Sepsis Labs Recent Labs  Lab 02/15/24 1354 02/16/24 0350  WBC 12.5* 10.5   Microbiology No results found for this or any previous visit (from the past 240 hours).   Time coordinating discharge: Over 30 minutes  SIGNED:   Alvira Philips [place], DO  [facility] 02/18/2024, 10:04 AM

## 2024-02-19 DIAGNOSIS — H31091 Other chorioretinal scars, right eye: Secondary | ICD-10-CM | POA: Diagnosis not present

## 2024-02-19 DIAGNOSIS — T85398D Other mechanical complication of other ocular prosthetic devices, implants and grafts, subsequent encounter: Secondary | ICD-10-CM | POA: Diagnosis not present

## 2024-02-19 DIAGNOSIS — Z9889 Other specified postprocedural states: Secondary | ICD-10-CM | POA: Diagnosis not present

## 2024-02-19 DIAGNOSIS — H33021 Retinal detachment with multiple breaks, right eye: Secondary | ICD-10-CM | POA: Diagnosis not present

## 2024-02-21 ENCOUNTER — Ambulatory Visit: Payer: Self-pay | Admitting: Family Medicine

## 2024-02-21 NOTE — Telephone Encounter (Signed)
  Chief Complaint: breathing difficulty  Symptoms: cough, wheezing Frequency: comes and goes  Disposition: [] ED /[] Urgent Care (no appt availability in office) / [x] Appointment(In office/virtual)/ []  [facility]/ [] Home Care/ [] Refused Recommended Disposition /[] [facility]/ []  Follow-up with PCP Additional Notes: PT calling with complaints of worsening breathing issues. Pt was hospitalized on 2/27 for A Fib. F/u app is 3/14. Pt feels he caught a respiratory infection and is getting worse and doesn't want to wait until 3/14. O2 sat ranges 93-95%. Pt is currently on day 8 of  Cipro. Pt will only see PCP and refused other providers. RN called [facility] for sooner availability with no luck. RN gave care advice and instructed Pt to seek help at ED if breathing worsens. Pt verbalized understanding.             Copied from CRM [id number]. Topic: Clinical - Red Word Triage >> Feb 21, 2024  9:41 AM Izetta Dakin wrote: [facility] that prompted transfer to Nurse Triage: Difficulty breathing Reason for Disposition  [1] MODERATE longstanding difficulty breathing (e.g., speaks in phrases, SOB even at rest, pulse 100-120) AND [2] SAME as normal  Answer Assessment - Initial Assessment Questions 1. RESPIRATORY STATUS: "Describe your breathing?" (e.g., wheezing, shortness of breath, unable to speak, severe coughing)      Wheezing, SOB 2. ONSET: "When did this breathing problem begin?"      More than a month  3. PATTERN "Does the difficult breathing come and go, or has it been constant since it started?"      Comes and goes  4. SEVERITY: "How bad is your breathing?" (e.g., mild, moderate, severe)    - MILD: No SOB at rest, mild SOB with walking, speaks normally in sentences, can lie down, no retractions, pulse < 100.    - MODERATE: SOB at rest, SOB with minimal exertion and prefers to sit, cannot lie down flat, speaks in phrases, mild retractions, audible wheezing, pulse 100-120.    - SEVERE:  Very SOB at rest, speaks in single words, struggling to breathe, sitting hunched forward, retractions, pulse > 120      Moderate  6. CARDIAC HISTORY: "Do you have any history of heart disease?" (e.g., heart attack, angina, bypass surgery, angioplasty)      A Fib  7. LUNG HISTORY: "Do you have any history of lung disease?"  (e.g., pulmonary embolus, asthma, emphysema)     Asthma, bronchi issues  8. CAUSE: "What do you think is causing the breathing problem?"      Not sure  9. OTHER SYMPTOMS: "Do you have any other symptoms? (e.g., dizziness, runny nose, cough, chest pain, fever)     Cough  10. O2 SATURATION MONITOR:  "Do you use an oxygen saturation monitor (pulse oximeter) at home?" If Yes, ask: "What is your reading (oxygen level) today?" "What is your usual oxygen saturation reading?" (e.g., 95%)       93%  Protocols used: Breathing Difficulty-A-AH

## 2024-02-22 ENCOUNTER — Other Ambulatory Visit: Payer: Self-pay | Admitting: Family Medicine

## 2024-02-22 MED ORDER — PREDNISONE 20 MG PO TABS: ORAL_TABLET | ORAL | 0 refills | Status: DC

## 2024-02-23 ENCOUNTER — Other Ambulatory Visit: Payer: Self-pay | Admitting: Family Medicine

## 2024-02-23 MED ORDER — LEVOFLOXACIN 500 MG PO TABS: 500.0000 mg | ORAL_TABLET | Freq: Every day | ORAL | 0 refills | Status: AC

## 2024-02-25 ENCOUNTER — Other Ambulatory Visit: Payer: Self-pay | Admitting: Cardiovascular Disease

## 2024-02-25 DIAGNOSIS — I480 Paroxysmal atrial fibrillation: Secondary | ICD-10-CM

## 2024-02-26 NOTE — Telephone Encounter (Signed)
 Prescription refill request for Eliquis received. Indication:afib Last office visit:1/25 Scr:0.79  3/25 Age: 78 Weight:106.6  kg  Prescription refilled

## 2024-02-27 ENCOUNTER — Ambulatory Visit: Admitting: Family Medicine

## 2024-03-01 ENCOUNTER — Ambulatory Visit: Admitting: Family Medicine

## 2024-03-01 ENCOUNTER — Encounter: Payer: Self-pay | Admitting: Family Medicine

## 2024-03-01 VITALS — BP 124/70 | HR 94 | Temp 97.9°F | Ht 69.0 in | Wt 224.8 lb

## 2024-03-01 DIAGNOSIS — J4489 Other specified chronic obstructive pulmonary disease: Secondary | ICD-10-CM | POA: Diagnosis not present

## 2024-03-01 DIAGNOSIS — I4819 Other persistent atrial fibrillation: Secondary | ICD-10-CM | POA: Diagnosis not present

## 2024-03-01 DIAGNOSIS — R0602 Shortness of breath: Secondary | ICD-10-CM

## 2024-03-01 MED ORDER — METOPROLOL SUCCINATE ER 100 MG PO TB24: 200.0000 mg | ORAL_TABLET | Freq: Every day | ORAL | 3 refills | Status: DC

## 2024-03-01 MED ORDER — PREDNISONE 20 MG PO TABS: ORAL_TABLET | ORAL | 0 refills | Status: DC

## 2024-03-01 NOTE — Progress Notes (Signed)
 Subjective:    Patient ID: Mario [name], male    DOB: [birth year], 78 y.o.   MRN: [id number] Admit date: 02/15/2024 Discharge date: 02/18/2024   Admitted From: Home Disposition: Home   Recommendations for Outpatient Follow-up:  Follow up with PCP in 1-2 weeks Follow-up with cardiology, Dr. Gery Pray in 1-2 weeks Metoprolol succinate increased to 200 mg p.o. daily for A-fib with RVR Discontinue losartan to allow up titration of metoprolol Discontinued albuterol as likely precipitating factor to his rapid ventricular response, substituted with Xopenex   Home Health: No Equipment/Devices: None   Discharge Condition: Stable CODE STATUS: Full code Diet recommendation: Heart healthy diet   History of present illness:   Mario [name] is a 78 y.o. male with past medical history significant for HTN, HLD, persistent atrial fibrillation on Eliquis, chronic bronchiectasis who presented to [facility] ED on 02/15/2024 with progressive dyspnea.  He initially was seen by his PCP in the morning, at their office he was noted to be in rapid A-fib and sent to the ED for further evaluation.  Patient denies any fevers, no chills, no nausea/vomiting/diarrhea, no chest pain, no syncope or lightheadedness.  He reports is overall compliant with his medications but occasionally may miss a dose of Eliquis here and there.   In the ED, temperature 98.5 F, HR 154, RR 16, BP 107/76, SpO2 97% on room air.  WBC 12.5, hemoglobin 11.3, platelet count 360.  Sodium 138, potassium 3.9, chloride 106, CO2 22, glucose 97, BUN 30, creatinine 1.09.  Chest x-ray with stable left retrocardiac opacity, no acute cardiopulmonary disease process.  Patient was placed on IV Cardizem drip, with improvement of heart rate although his systolic blood pressure dropped into the 90s and was subsequently discontinued.  TRH consulted for admission for further evaluation management of A-fib with RVR.   Hospital course:   Persistent  atrial fibrillation with RVR Patient presenting to the ED with progressive exertional dyspnea, found to be in A-fib with RVR with heart rates in the 150s.  Initially placed on Cardizem drip with improvement although also became slightly hypotensive and Cardizem drip was discontinued.  At baseline on metoprolol succinate 50 mg p.o. daily.  TSH within normal limits.  Potentially worsen with home use of albuterol.  Follows with cardiology outpatient, Dr. Allyson Sabal.  Metoprolol succinate was uptitrated to 200 mg p.o. daily.  Continue Eliquis 5 mg p.o. twice daily.  Outpatient follow-up with cardiology.   Musculoskeletal back pain Patient complains of pain to his lumbar paraspinal region bilaterally.  No history of recent trauma.  Has had side effect with tizanidine in the past.  Started on Robaxin and tramadol as needed.  Outpatient follow-up with orthopedics.   Essential hypertension Metoprolol succinate increased to 200 mg p.o. daily as above. Holding home losartan to allow adjustments of beta-blocker. Continue furosemide 40 mg p.o. daily   Hyperlipidemia Continue Crestor 20 mg p.o. daily   Chronic bronchiectasis Continue Symbicort, Singulair.  Albuterol discontinued in favor of Xopenex.  Continue ciprofloxacin as prescribed by outpatient provider for 10-day treatment of acute on chronic bronchiectasis.  Outpatient follow-up with pulmonology.   Obesity, class II Body mass index is 34.71 kg/m.    Discharge Diagnoses:  Principal Problem:   Atrial fibrillation with rapid ventricular response (HCC)  03/01/24 Patient states that his heart rate is doing much better.  Today he is in the 90s.  He is unable to locate 200 mg metoprolol.  Therefore I am sending him 100 mg  Toprol pills and he can take 2 a day.  He is currently using Symbicort as maintenance therapy.  After leaving the hospital he had an additional COPD exacerbation and required Levaquin prednisone to treat.  He is nauseous for about  consistently.  He is taking Mucinex consistently.  He continues to endorse severe shortness of breath with activity.  He states that he is having to stop walking up steps to catch his breath.  On high-resolution CT scan of the chest last year he did have the mention of coronary artery disease.  He denies any chest pain but he does report chest tightness with activity that he attributes to his asthma Past Medical History:  Diagnosis Date   Asthma    Biliary colic 2014   COPD (chronic obstructive pulmonary disease) with chronic bronchitis (HCC)    Coronary artery calcification seen on CAT scan    Cyst of pancreas    1 cm (7/18), repeat ct in 12 months.     Hyperlipidemia    Hypertension    Obstructive sleep apnea    Renal disorder    Rhinitis    Sleep apnea    pt denies sleep apnea-lost weight   Past Surgical History:  Procedure Laterality Date   COLONOSCOPY  09/29/2014   LAPAROSCOPIC CHOLECYSTECTOMY SINGLE SITE WITH INTRAOPERATIVE CHOLANGIOGRAM N/A 09/15/2019   Procedure: LAPAROSCOPIC CHOLECYSTECTOMY SINGLE SITE;  Surgeon: Karie Soda, MD;  Location: [facility];  Service: General;  Laterality: N/A;   LITHOTRIPSY  2000   for kidney stone   Current Outpatient Medications on File Prior to Visit  Medication Sig Dispense Refill   apixaban (ELIQUIS) 5 MG TABS tablet TAKE 1 TABLET BY MOUTH TWICE A DAY 60 tablet 5   Ascorbic Acid (VITAMIN C) 1000 MG tablet Take 1,000 mg by mouth 2 (two) times daily.     budesonide-formoterol (SYMBICORT) 160-4.5 MCG/ACT inhaler Inhale 2 puffs into the lungs in the morning and at bedtime. Generic medication 1 each 6   Cholecalciferol (VITAMIN D3) 125 MCG (5000 UT) TABS Take by mouth.     Coenzyme Q10 (CO Q-10) 100 MG CAPS Take 1 capsule by mouth daily.     dorzolamide-timolol (COSOPT) 2-0.5 % ophthalmic solution Place 1 drop into the right eye 2 (two) times daily.     FIBER COMPLETE PO Take 1 tablet by mouth daily.     furosemide (LASIX) 40 MG tablet TAKE 1  TABLET (40 MG TOTAL) BY MOUTH DAILY. STOP HCTZ 30 tablet 3   levalbuterol (XOPENEX) 0.63 MG/3ML nebulizer solution Take 3 mLs (0.63 mg total) by nebulization every 4 (four) hours as needed for wheezing or shortness of breath. 360 mL 2   levofloxacin (LEVAQUIN) 500 MG tablet Take 1 tablet (500 mg total) by mouth daily for 7 days. Stop cipro 7 tablet 0   methocarbamol (ROBAXIN) 500 MG tablet Take 1 tablet (500 mg total) by mouth every 6 (six) hours as needed for muscle spasms. 30 tablet 0   metoprolol succinate (TOPROL-XL) 200 MG 24 hr tablet Take 1 tablet (200 mg total) by mouth daily. Take with or immediately following a meal. 90 tablet 0   montelukast (SINGULAIR) 10 MG tablet Take 1 tablet (10 mg total) by mouth daily. 90 tablet 2   pantoprazole (PROTONIX) 40 MG tablet Take 1 tablet (40 mg total) by mouth 2 (two) times daily. 180 tablet 2   predniSONE (DELTASONE) 20 MG tablet 3 tabs poqday 1-2, 2 tabs poqday 3-4, 1 tab poqday 5-6 12  tablet 0   Respiratory Therapy Supplies (FLUTTER) DEVI Use as directed 1 each 0   rosuvastatin (CRESTOR) 20 MG tablet TAKE 1 TABLET BY MOUTH EVERY DAY **D/C LIPITOR** 90 tablet 2   traMADol (ULTRAM) 50 MG tablet Take 1 tablet (50 mg total) by mouth every 8 (eight) hours as needed for moderate pain (pain score 4-6). 30 tablet 0   No current facility-administered medications on file prior to visit.   Allergies  Allergen Reactions   Dust Mite Extract Other (See Comments)   Social History   Socioeconomic History   Marital status: Married    Spouse name: Not on file   Number of children: Not on file   Years of education: Not on file   Highest education level: Bachelor's degree (e.g., BA, AB, BS)  Occupational History   Not on file  Tobacco Use   Smoking status: Former    Current packs/day: 0.00    Average packs/day: 2.0 packs/day for 11.0 years (22.0 ttl pk-yrs)    Types: Cigarettes    Start date: 12/20/1963    Quit date: 12/19/1974    Years since quitting:  49.2   Smokeless tobacco: Never  Vaping Use   Vaping status: Never Used  Substance and Sexual Activity   Alcohol use: Not Currently    Alcohol/week: 1.0 standard drink of alcohol    Types: 1 Glasses of wine per week   Drug use: No   Sexual activity: Not on file  Other Topics Concern   Not on file  Social History Narrative   Not on file   Social Drivers of Health   Financial Resource Strain: Low Risk  (02/14/2024)   Overall Financial Resource Strain (CARDIA)    Difficulty of Paying Living Expenses: Not very hard  Food Insecurity: No Food Insecurity (02/15/2024)   Hunger Vital Sign    Worried About Running Out of Food in the Last Year: Never true    Ran Out of Food in the Last Year: Never true  Transportation Needs: No Transportation Needs (02/15/2024)   PRAPARE - Administrator, Civil Service (Medical): No    Lack of Transportation (Non-Medical): No  Physical Activity: Insufficiently Active (02/14/2024)   Exercise Vital Sign    Days of Exercise per Week: 1 day    Minutes of Exercise per Session: 20 min  Stress: No Stress Concern Present (02/14/2024)   [employer] of Occupational Health - Occupational Stress Questionnaire    Feeling of Stress : Not at all  Social Connections: Socially Integrated (02/15/2024)   Social Connection and Isolation Panel [NHANES]    Frequency of Communication with Friends and Family: More than three times a week    Frequency of Social Gatherings with Friends and Family: More than three times a week    Attends Religious Services: More than 4 times per year    Active Member of [employer] or Organizations: Yes    Attends Banker Meetings: More than 4 times per year    Marital Status: Married  Catering manager Violence: Not At Risk (02/15/2024)   Humiliation, Afraid, Rape, and Kick questionnaire    Fear of Current or Ex-Partner: No    Emotionally Abused: No    Physically Abused: No    Sexually Abused: No      Review of  Systems  Respiratory:  Positive for cough.   All other systems reviewed and are negative.      Objective:   Physical Exam Vitals reviewed.  Constitutional:      Appearance: He is well-developed.  HENT:     Head: Normocephalic and atraumatic.     Nose: Nose normal.     Mouth/Throat:     Pharynx: No oropharyngeal exudate.  Neck:     Vascular: No carotid bruit.  Cardiovascular:     Rate and Rhythm: Tachycardia present. Rhythm irregular.     Heart sounds: Normal heart sounds.  Pulmonary:     Effort: Pulmonary effort is normal. No tachypnea or respiratory distress.     Breath sounds: No stridor or decreased air movement. Wheezing, rhonchi and rales present.    Musculoskeletal:     Right lower leg: Edema present.     Left lower leg: Edema present.           Assessment & Plan:  COPD (chronic obstructive pulmonary disease) with chronic bronchitis (HCC)  Persistent atrial fibrillation (HCC)  Shortness of breath Complicated situation.  Patient has COPD complicated with bronchiectasis.  I recommended adding Spiriva 2.5 mg, 2 inhalations daily in addition to his Symbicort to try to improve his breathing.  I recommended that he use his flutter valve more consistently and frequently to help dislodge the mucus in his airways and improve his bronchiectasis.  Continue to use Mucinex 1200 mg twice daily.  Use Xopenex only for breakthrough exacerbations.  Heart rate is now controlled on Toprol-XL 200 mg a day.  However the patient continues to have dyspnea on exertion.  I have recommended that he discuss a stress test with his cardiologist when he meets with them next week.  He has coronary artery disease mentioned on the CAT scan but we do not know the extent.  Patient may have a flow-limiting lesion that may be contributing some to his dyspnea on exertion.  Earlier this year he had an elevated BNP which I presumed his diastolic heart failure related.  I want to ensure that we have  evaluated all possible causes of shortness of breath and dyspnea on exertion

## 2024-03-04 ENCOUNTER — Other Ambulatory Visit: Payer: Self-pay

## 2024-03-04 DIAGNOSIS — E782 Mixed hyperlipidemia: Secondary | ICD-10-CM

## 2024-03-10 ENCOUNTER — Emergency Department (HOSPITAL_BASED_OUTPATIENT_CLINIC_OR_DEPARTMENT_OTHER)
Admission: EM | Admit: 2024-03-10 | Discharge: 2024-03-10 | Disposition: A | Discharge status: Home / Self Care | Attending: Emergency Medicine | Admitting: Emergency Medicine

## 2024-03-10 ENCOUNTER — Emergency Department (HOSPITAL_BASED_OUTPATIENT_CLINIC_OR_DEPARTMENT_OTHER): Admitting: Radiology

## 2024-03-10 ENCOUNTER — Encounter (HOSPITAL_BASED_OUTPATIENT_CLINIC_OR_DEPARTMENT_OTHER): Payer: Self-pay | Admitting: Emergency Medicine

## 2024-03-10 ENCOUNTER — Other Ambulatory Visit: Payer: Self-pay

## 2024-03-10 ENCOUNTER — Emergency Department (HOSPITAL_BASED_OUTPATIENT_CLINIC_OR_DEPARTMENT_OTHER)

## 2024-03-10 DIAGNOSIS — Z7901 Long term (current) use of anticoagulants: Secondary | ICD-10-CM | POA: Insufficient documentation

## 2024-03-10 DIAGNOSIS — L02512 Cutaneous abscess of left hand: Secondary | ICD-10-CM | POA: Diagnosis not present

## 2024-03-10 DIAGNOSIS — M79641 Pain in right hand: Secondary | ICD-10-CM | POA: Diagnosis not present

## 2024-03-10 DIAGNOSIS — B0089 Other herpesviral infection: Secondary | ICD-10-CM | POA: Insufficient documentation

## 2024-03-10 DIAGNOSIS — Z23 Encounter for immunization: Secondary | ICD-10-CM | POA: Diagnosis not present

## 2024-03-10 MED ORDER — OXYCODONE-ACETAMINOPHEN 5-325 MG PO TABS: 1.0000 | ORAL_TABLET | Freq: Four times a day (QID) | ORAL | 0 refills | Status: DC | PRN

## 2024-03-10 MED ORDER — TETANUS-DIPHTH-ACELL PERTUSSIS 5-2.5-18.5 LF-MCG/0.5 IM SUSY
0.5000 mL | PREFILLED_SYRINGE | Freq: Once | INTRAMUSCULAR | Status: AC
  Administered 2024-03-10: 0.5 mL via INTRAMUSCULAR
  Filled 2024-03-10: qty 0.5

## 2024-03-10 MED ORDER — VALACYCLOVIR HCL 1 G PO TABS: 1000.0000 mg | ORAL_TABLET | Freq: Three times a day (TID) | ORAL | 0 refills | Status: DC

## 2024-03-10 MED ORDER — DOXYCYCLINE HYCLATE 100 MG PO CAPS: 100.0000 mg | ORAL_CAPSULE | Freq: Two times a day (BID) | ORAL | 0 refills | Status: AC

## 2024-03-10 MED ORDER — LIDOCAINE HCL (PF) 1 % IJ SOLN
10.0000 mL | Freq: Once | INTRAMUSCULAR | Status: DC
  Filled 2024-03-10: qty 10

## 2024-03-10 NOTE — Progress Notes (Deleted)
 Mario [name], male    DOB: 1946/02/08, 78 y.o.   MRN: [id number]   Brief patient profile:  77  yowm quit smoking in 1976 reports during  teens in [place]  cough/ wheeze and several admits > [facility] dx allergy  to grass/ dust with def correlation with his symptoms  but resolved by age 52 then recurred while smoking in 1976 and seemed to get worse while in [place]> pulmonary eval rx and happy with results but on daily meds ever since moved to [place] in 1991 and saw [facility]  in 2000 for placed on shots x maybe a year but no change in maint therapy rx which seemed to help but freq flares changed from symbicort /spiriva   to trelegy then much worse while on trelegy which was around 03/2018 with cough/ wheeze>  On his own switched back to symbicort /spiriva  around 06/25/18 and referred to pulmonary clinic 07/05/2018 by Dr   Mario [name].    History of Present Illness  07/05/2018  Mario [name]/ 1st [name] eval / bronchiectasis in setting of chronic asthma  Chief Complaint  Patient presents with   Pulmonary Consult    Referred by Dr. Cheril [name]. Pt c/o cough and SOB x 3 months. He is coughing up clear sputum, esp worse in the am. He uses proair  1-2 x daily on average and has neb with albuterol  that he rarely uses.   has sensation "lungs full of congestion" but doesn't cough much up and what he does is clear  cpap helps at hs / sleeps  on R side and bed is flat/ on left side side down wheezing keeps him up even on cpap Last pred 10 days prior to OV  And not better whereas previously worked great. Finished the abx about the time of the last ct 06/26/18  rec Bronchiectasis =   you have scarring of your bronchial tubes which means that they don't function perfectly normally and mucus tends to pool in certain areas of your lung which can cause pneumonia and further scarring of your lung and bronchial tubes Whenever you develop cough congestion take mucinex  or mucinex  dm > these will help keep the mucus loose and flowing but  if your condition worsens you need to seek help immediately preferably here or somewhere inside the [facility] system to compare xrays ( worse = darker or bloody mucus or pain on breathing in)   Use the flutter valve as much as possible Protonix  40 mg Take 30- 60 min before your first and last meals of the day  GERD (REFLUX)    schedule sinus CT > pos > ENT eval req Please schedule a follow up [name] visit in 4 weeks, sooner if needed with PFTs on return    08/13/2018  f/u ov/Mario [name] re: 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 Pt has c/o SOB with exertion, cough with clear mucus, occ. chest tightness.  rec Plan A = Automatic = symbiocort 160 x 2 and spiriva  x 2 pffs  first thing in am  then 12 hours later Symbiort x 160 mg Work on inhaler technique:  Plan [name] = Backup Only use your albuterol  as a rescue medication Plan C = Crisis - only use your albuterol  nebulizer if you first try Plan [name] and it fails to help > ok to use the nebulizer up to every 4 hours but  if start needing it regularly call for immediate appointment   03/15/2021  f/u ov/Mario [name] re: obst bronchiectasis / main on sym [name] 160  Chief Complaint  Patient presents with   Follow-up    Coughing and wheezing at night  Dyspnea:  yardwork ok  Cough: at usual level  Re cough/  wheezing but worse since finished last  last zpak / no purulent sputum Sleeping: bed with bed blocks  SABA use: no saba  02: none  Rec Nasty mucus >  zpak Thick mucus/ congestions > flutter and mucinex   Please schedule a follow up visit in 6  months but call sooner if needed    09/17/21 NP recs Finish Zpack as directed.  Continue on Symbicort  2 puffs Twice daily  , rinse after use.  Albuterol  inhaler or neb As needed   Mucinex  Twice daily  As needed   Flutter valve Twice daily   Changed to Advair then to Breztri  on 2 bid    05/01/2023   f/u ov/Mario [name] re: obst bronchiectasis maint on breztri    using flutter valve once a day   Chief Complaint  Patient presents with   Follow-up    Chest congestion with green mucus.  Has had 3 episodes since January 2024.  Dyspnea:  still some yard work  Cough: mucus cleared up with levaquin  back green again this am also has some green nasal production - on ppi one mistake  Sleeping: bed has bed blocks > cough and wheeze  SABA use: tid including hs  02: none  Rec When coughing > pantoprazole  should Take 30- 60 min before your first and last meals of the day use the mucinex  and flutter valve as much as possible  Cipro  500 mg twice daily x 10 days (refillable)  Reduce breztri  for now to one puff twice daily until you get Dulera  100 Take 2 puffs first thing in am and then another 2 puffs about 12 hours later.  Only use your albuterol  as a rescue medication  Depomedrol 120 mg IM   My [name] will be contacting you by phone for referral to sinus CT > not done as of  03/14/2024     Video visit 02/07/24 Reduce the symbicort  to 160 mg one twice daily   Increase the nebulizer to up to every 4 hours as needed  For cough/ congestion > mucinex  dm  up to maximum of  1200 mg every 12 hours and use the flutter valve as much as you can    Return ASAP with all medications   03/14/2024  f/u ov/Mario [name]/Mario [name] re: *** maint on ***  ? Needs sinus CT ***  No chief complaint on file.   Dyspnea:  *** Cough: *** Sleeping: ***   resp cc  SABA use: *** 02: ***  Lung cancer screening: ***   No obvious day to day or daytime variability or assoc excess/ purulent sputum or mucus plugs or hemoptysis or cp or chest tightness, subjective wheeze or overt sinus or hb symptoms.    Also denies any obvious fluctuation of symptoms with weather or environmental changes or other aggravating or alleviating factors except as outlined above   No unusual exposure hx or h/o childhood pna/ asthma or knowledge of  premature birth.  Current Allergies, Complete Past Medical History, Past Surgical History, Family History, and Social History were reviewed in [employer] record.  ROS  The following are not active complaints unless bolded Hoarseness, sore throat, dysphagia, dental problems,  itching, sneezing,  nasal congestion or discharge of excess mucus or purulent secretions, ear ache,   fever, chills, sweats, unintended wt loss or wt gain, classically pleuritic or exertional cp,  orthopnea pnd or arm/hand swelling  or leg swelling, presyncope, palpitations, abdominal pain, anorexia, nausea, vomiting, diarrhea  or change in bowel habits or change in bladder habits, change in stools or change in urine, dysuria, hematuria,  rash, arthralgias, visual complaints, headache, numbness, weakness or ataxia or problems with walking or coordination,  change in mood or  memory.        No outpatient medications have been marked as taking for the 03/14/24 encounter (Appointment) with Mario Diep B, MD.                    Objective:    Wts  03/14/2024       ***  05/01/2023       243  03/15/2021       221 06/27/2019         192  12/25/2018         205  09/24/2018       212  08/13/2018       218   07/05/18 224 lb (101.6 kg)  06/12/18 230 lb (104.3 kg)  05/25/18 233 lb (105.7 kg)    Vital signs reviewed  03/14/2024  - Note at rest 02 sats  ***% on ***   General appearance:    ***   Min barr***       I personally reviewed images and agree with radiology impression as follows:  CXR:   pa and plateral  03/23/23 1. No radiographic evidence of acute cardiopulmonary disease. Mild bibasilar scarring, similar to prior studies. 2. Moderate-sized hiatal hernia. 3. Aortic atherosclerosis.    Assessment

## 2024-03-10 NOTE — Discharge Instructions (Addendum)
 Your symptoms are likely due to a soft tissue infection of the little finger. Alternatively instead of bacterial infection, your symptoms could be due to a viral infection called herpetic whitlow. We will discharge you on both an antibiotic and an antiviral medication. Watch for signs of developing infection, including worsening pain and swelling of the entire digit, holding the finger in flexion due to pain, pain with passive extension of the finger, and worsening tenderness at the base of the finger. These signs would warrant immediate re-evaluation in the ED due to concern for a possible surgical emergency associated with the finger. Take doxycycline rather than Cipro for an antibiotic and change dressings daily.

## 2024-03-10 NOTE — ED Triage Notes (Signed)
 Patient coming to ED for evaluation of possible infection to R 5th finger.  Reports he cut it on a shower door 10 days ago.  Noticed swelling to area and attempted to "pop" it.  Redness and swelling noted to finger.  Pt reports he has been on antibiotics recently for "lung infection."  No reports of fevers.

## 2024-03-10 NOTE — ED Notes (Signed)
 X-ray at bedside

## 2024-03-10 NOTE — ED Provider Notes (Signed)
 [place] [facility] AT [facility] Provider Note   CSN: [id number] Arrival date & time: 03/10/24  4098     History  Chief Complaint  Patient presents with   Finger Injury    Mario [name] is a 78 y.o. male.  HPI   78 year old right handed male presenting to the emergency department with a chief complaint of finger abscess.  The history is provided by the patient who states that he cut his finger on a shower door 10 days ago.  He noticed some swelling to the area and attempted to pop it.  He has had swelling along the left fifth digit, with some drainage. His tetanus was last updated in 2008.  He just started taking ciprofloxacin but has only been on 1 day of the antibiotic.  He last took his Eliquis last night.  Home Medications Prior to Admission medications   Medication Sig Start Date End Date Taking? Authorizing Provider  doxycycline (VIBRAMYCIN) 100 MG capsule Take 1 capsule (100 mg total) by mouth 2 (two) times daily for 7 days. 03/10/24 03/17/24 Yes Ernie Avena, MD  oxyCODONE-acetaminophen (PERCOCET/ROXICET) 5-325 MG tablet Take 1 tablet by mouth every 6 (six) hours as needed for severe pain (pain score 7-10). 03/10/24  Yes Ernie Avena, MD  valACYclovir (VALTREX) 1000 MG tablet Take 1 tablet (1,000 mg total) by mouth 3 (three) times daily. 03/10/24  Yes Ernie Avena, MD  apixaban (ELIQUIS) 5 MG TABS tablet TAKE 1 TABLET BY MOUTH TWICE A DAY 02/26/24   Runell Gess, MD  Ascorbic Acid (VITAMIN C) 1000 MG tablet Take 1,000 mg by mouth 2 (two) times daily. 11/18/20   [provider]  budesonide-formoterol (SYMBICORT) 160-4.5 MCG/ACT inhaler Inhale 2 puffs into the lungs in the morning and at bedtime. Generic medication 12/22/23   Donita Brooks, MD  Cholecalciferol (VITAMIN D3) 125 MCG (5000 UT) TABS Take by mouth.    [provider]  Coenzyme Q10 (CO Q-10) 100 MG CAPS Take 1 capsule by mouth daily.    [provider]   dorzolamide-timolol (COSOPT) 2-0.5 % ophthalmic solution Place 1 drop into the right eye 2 (two) times daily.    [provider]  FIBER COMPLETE PO Take 1 tablet by mouth daily.    [provider]  furosemide (LASIX) 40 MG tablet TAKE 1 TABLET (40 MG TOTAL) BY MOUTH DAILY. STOP HCTZ 02/08/24   Donita Brooks, MD  levalbuterol Pauline Aus) 0.63 MG/3ML nebulizer solution Take 3 mLs (0.63 mg total) by nebulization every 4 (four) hours as needed for wheezing or shortness of breath. 02/18/24 02/17/25  [place], Eric J, DO  metoprolol succinate (TOPROL-XL) 100 MG 24 hr tablet Take 2 tablets (200 mg total) by mouth daily. Take with or immediately following a meal. 03/01/24   Pickard, Priscille Heidelberg, MD  montelukast (SINGULAIR) 10 MG tablet Take 1 tablet (10 mg total) by mouth daily. 09/11/23   Donita Brooks, MD  pantoprazole (PROTONIX) 40 MG tablet Take 1 tablet (40 mg total) by mouth 2 (two) times daily. 09/11/23   Donita Brooks, MD  predniSONE (DELTASONE) 20 MG tablet 3 tabs poqday 1-2, 2 tabs poqday 3-4, 1 tab poqday 5-6 03/01/24   Donita Brooks, MD  Respiratory Therapy Supplies (FLUTTER) DEVI Use as directed 07/05/18   Nyoka Cowden, MD  rosuvastatin (CRESTOR) 20 MG tablet TAKE 1 TABLET BY MOUTH EVERY DAY **D/C LIPITOR** 02/08/24   Donita Brooks, MD  Allergies    Dust mite extract    Review of Systems   Review of Systems  All other systems reviewed and are negative.   Physical Exam Updated Vital Signs BP (!) 149/106 (BP Location: Right Arm)   Pulse 84   Temp 97.7 F (36.5 C) (Oral)   Resp 16   Ht 5\' 9"  (1.753 m)   Wt 101.6 kg   SpO2 94%   BMI 33.08 kg/m  Physical Exam Vitals and nursing note reviewed.  Constitutional:      General: He is not in acute distress. HENT:     Head: Normocephalic and atraumatic.  Eyes:     Conjunctiva/sclera: Conjunctivae normal.     Pupils: Pupils are equal, round, and reactive to light.  Cardiovascular:     Rate and Rhythm:  Normal rate and regular rhythm.  Pulmonary:     Effort: Pulmonary effort is normal. No respiratory distress.  Abdominal:     General: There is no distension.     Tenderness: There is no guarding.  Musculoskeletal:        General: Swelling present. No deformity or signs of injury.     Cervical back: Neck supple.     Comments: Localized soft tissue swelling/abscess along the volar aspect of the left 5th digit, with scabbing/ulceration. Finger not held in passive flexion, no fusiform swelling, no significant pain with extension of the finger, and no significant TTP of the proximal flexor tendon sheath  Skin:    Findings: No lesion or rash.  Neurological:     General: No focal deficit present.     Mental Status: He is alert. Mental status is at baseline.      ED Results / Procedures / Treatments   Labs (all labs ordered are listed, but only abnormal results are displayed) Labs Reviewed - No data to display  EKG None  Radiology DG Hand 2 View Left Addendum Date: 03/10/2024 ADDENDUM REPORT: 03/10/2024 08:28 ADDENDUM: The exam title in the original report is "right hand" as the incorrect laterality was ordered but the correct laterality exam was performed. This exam and this report are for a two view study of the left hand. Electronically Signed   By: Kennith Center M.D.   On: 03/10/2024 08:28   Result Date: 03/10/2024 CLINICAL DATA:  Laceration to pinky finger 10 days ago. Swelling. Pain. EXAM: RIGHT HAND - 2 VIEW COMPARISON:  None Available. FINDINGS: Limited two view study due to superimposition of bony anatomy on the lateral projection. Within this limitation there is no evidence for an acute fracture or dislocation. No radiopaque soft tissue foreign body noted in the pinky finger. IMPRESSION: No evidence for an acute fracture or radiopaque soft tissue foreign body in the pinky finger. Electronically Signed: By: Kennith Center M.D. On: 03/10/2024 08:16   DG Hand 2 View Right Result Date:  03/10/2024 CLINICAL DATA:  Laceration to pinky finger 10 days ago. Swelling. Pain. EXAM: RIGHT HAND - 2 VIEW COMPARISON:  None Available. FINDINGS: Limited two view study due to superimposition of bony anatomy on the lateral projection. Within this limitation there is no evidence for an acute fracture or dislocation. No radiopaque soft tissue foreign body noted in the pinky finger. IMPRESSION: No evidence for an acute fracture or radiopaque soft tissue foreign body in the pinky finger. Electronically Signed   By: Kennith Center M.D.   On: 03/10/2024 07:27    Procedures .Incision and Drainage  Date/Time: 03/10/2024 8:23 AM  Performed by:  Ernie Avena, MD Authorized by: Ernie Avena, MD   Consent:    Consent obtained:  Verbal   Consent given by:  Patient   Risks discussed:  Bleeding, incomplete drainage, pain and infection Universal protocol:    Patient identity confirmed:  Arm band Location:    Type:  Abscess   Size:  0.5   Location:  Upper extremity   Upper extremity location:  Finger   Finger location:  L small finger Pre-procedure details:    Skin preparation:  Chlorhexidine Sedation:    Sedation type:  None Anesthesia:    Anesthesia method:  Local infiltration   Local anesthetic:  Lidocaine 1% w/o epi Procedure type:    Complexity:  Simple Procedure details:    Incision types:  Stab incision   Wound management:  Probed and deloculated   Drainage:  Bloody   Drainage amount:  Scant   Packing materials:  1/4 in iodoform gauze Post-procedure details:    Procedure completion:  Tolerated     Medications Ordered in ED Medications  lidocaine (PF) (XYLOCAINE) 1 % injection 10 mL (has no administration in time range)  Tdap (BOOSTRIX) injection 0.5 mL (0.5 mLs Intramuscular Given 03/10/24 1610)    ED Course/ Medical Decision Making/ A&P                                 Medical Decision Making Amount and/or Complexity of Data Reviewed Radiology:  ordered.  Risk Prescription drug management.    78 year old right handed male presenting to the emergency department with a chief complaint of finger abscess.  The history is provided by the patient who states that he cut his finger on a shower door 10 days ago.  He noticed some swelling to the area and attempted to pop it.  He has had swelling along the left fifth digit, with some drainage. His tetanus was last updated in 2008.  He just started taking ciprofloxacin but has only been on 1 day of the antibiotic.  He last took his Eliquis last night.  On arrival, the patient was vitally stable. Exam revealed localized soft tissue swelling/abscess along the volar aspect of the left 5th digit, with scabbing/ulceration. Finger not held in passive flexion, no fusiform swelling, no significant pain with extension of the finger, and no significant TTP of the proximal flexor tendon sheath. Sx due likely to abscess as the patient endorsed picking at a healing scab which resulted in worsening pain and swelling, or herpetic whitlow.  XR imaging was performed to look for evidence of injury or radiopaque foreign body, resulted negative.  Discussed the care of the patient with Dr. Eulah Pont of on-call hand surgery who advised discharge on oral antibiotics after I&D, follow-up outpatient in 1 week's time.  I&D performed as per procedure note above. Scant bloody drainage appreciated. Wound dressed with nonadherent gauze and bulky dressing. Pt discharged on Doxycycline and Percocet for breakthrough pain. Given consideration of herpetic whitlow, will also discharge on Valtrex. Plan for outpatient hand follow-up for repeat assessment. Return precautions provided.  Final Clinical Impression(s) / ED Diagnoses Final diagnoses:  Abscess of left little finger  Herpetic whitlow    Rx / DC Orders ED Discharge Orders          Ordered    Ambulatory referral to Hand Surgery        03/10/24 0809    doxycycline  (VIBRAMYCIN) 100 MG capsule  2 times  daily        03/10/24 0810    valACYclovir (VALTREX) 1000 MG tablet  3 times daily        03/10/24 0830    oxyCODONE-acetaminophen (PERCOCET/ROXICET) 5-325 MG tablet  Every 6 hours PRN        03/10/24 0839              Ernie Avena, MD 03/10/24 0840

## 2024-03-10 NOTE — ED Notes (Signed)
 Wound dressed with dry dressing, discussed wound care in depth with patient and answered questions. Dc instructions reviewed with patient. Patient voiced understanding. Dc with belongings.

## 2024-03-12 ENCOUNTER — Ambulatory Visit: Attending: Emergency Medicine | Admitting: Emergency Medicine

## 2024-03-12 ENCOUNTER — Encounter: Payer: Self-pay | Admitting: Emergency Medicine

## 2024-03-12 VITALS — BP 130/84 | HR 98 | Ht 69.0 in | Wt 222.0 lb

## 2024-03-12 DIAGNOSIS — I10 Essential (primary) hypertension: Secondary | ICD-10-CM | POA: Diagnosis not present

## 2024-03-12 DIAGNOSIS — E782 Mixed hyperlipidemia: Secondary | ICD-10-CM | POA: Diagnosis not present

## 2024-03-12 DIAGNOSIS — I4819 Other persistent atrial fibrillation: Secondary | ICD-10-CM | POA: Diagnosis not present

## 2024-03-12 DIAGNOSIS — I480 Paroxysmal atrial fibrillation: Secondary | ICD-10-CM | POA: Diagnosis not present

## 2024-03-12 DIAGNOSIS — I2510 Atherosclerotic heart disease of native coronary artery without angina pectoris: Secondary | ICD-10-CM

## 2024-03-12 DIAGNOSIS — R0609 Other forms of dyspnea: Secondary | ICD-10-CM

## 2024-03-12 NOTE — Patient Instructions (Addendum)
 Medication Instructions:  NO CHANGES   Lab Work: BMET TO BE DONE TODAY.   Testing/Procedures:    Please report to Radiology at the [facility] Main Entrance 30 minutes early for your test.  [address] [place], [place] [address]                      How to Prepare for Your Cardiac PET/CT Stress Test:  Nothing to eat or drink, except water, 3 hours prior to arrival time.  NO caffeine/decaffeinated products, or chocolate 12 hours prior to arrival. (Please note decaffeinated beverages (teas/coffees) still contain caffeine).  If you have caffeine within 12 hours prior, the test will need to be rescheduled.  Medication instructions: Do not take erectile dysfunction medications for 72 hours prior to test (sildenafil, tadalafil) Do not take nitrates (isosorbide mononitrate, Ranexa) the day before or day of test Do not take tamsulosin the day before or morning of test Hold theophylline containing medications for 12 hours. Hold Dipyridamole 48 hours prior to the test.  Diabetic Preparation: If able to eat breakfast prior to 3 hour fasting, you may take all medications, including your insulin. Do not worry if you miss your breakfast dose of insulin - start at your next meal. If you do not eat prior to 3 hour fast-Hold all diabetes (oral and insulin) medications. Patients who wear a continuous glucose monitor MUST remove the device prior to scanning.  You may take your remaining medications with water.  NO perfume, cologne or lotion on chest or abdomen area. FEMALES - Please avoid wearing dresses to this appointment.  Total time is 1 to 2 hours; you may want to bring reading material for the waiting time.  IF YOU THINK YOU MAY BE PREGNANT, OR ARE NURSING PLEASE INFORM THE TECHNOLOGIST.  In preparation for your appointment, medication and supplies will be purchased.  Appointment availability is limited, so if you need to cancel or reschedule, please call the Radiology  Department Scheduler at [phone] 24 hours in advance to avoid a cancellation fee of $100.00  What to Expect When you Arrive:  Once you arrive and check in for your appointment, you will be taken to a preparation room within the Radiology Department.  A technologist or Nurse will obtain your medical history, verify that you are correctly prepped for the exam, and explain the procedure.  Afterwards, an IV will be started in your arm and electrodes will be placed on your skin for EKG monitoring during the stress portion of the exam. Then you will be escorted to the PET/CT scanner.  There, staff will get you positioned on the scanner and obtain a blood pressure and EKG.  During the exam, you will continue to be connected to the EKG and blood pressure machines.  A small, safe amount of a radioactive tracer will be injected in your IV to obtain a series of pictures of your heart along with an injection of a stress agent.    After your Exam:  It is recommended that you eat a meal and drink a caffeinated beverage to counter act any effects of the stress agent.  Drink plenty of fluids for the remainder of the day and urinate frequently for the first couple of hours after the exam.  Your doctor will inform you of your test results within 7-10 business days.  For more information and frequently asked questions, please visit our website: [email or web address]  For questions about your test or how  to prepare for your test, please call: Cardiac Imaging Nurse Navigators Office: [phone]   Follow-Up: At [facility], you and your health needs are our priority.  As part of our continuing mission to provide you with exceptional heart care, we have created designated Provider Care Teams.  These Care Teams include your primary Cardiologist (physician) and Advanced Practice Providers (APPs -  Physician Assistants and Nurse Practitioners) who all work together to provide you with the care  you need, when you need it.  We recommend signing up for the patient portal called "MyChart".  Sign up information is provided on this After Visit Summary.  MyChart is used to connect with patients for Virtual Visits (Telemedicine).  Patients are able to view lab/test results, encounter notes, upcoming appointments, etc.  Non-urgent messages can be sent to your provider as well.   To learn more about what you can do with MyChart, go to [email or web address].    Your next appointment:   3 MONTHS  Provider:   Rise Paganini, DNP  Other Instructions:

## 2024-03-12 NOTE — Progress Notes (Signed)
 Cardiology Office Note:    Date:  03/12/2024  ID:  Mario [name], DOB Aug 02, 1946, MRN [id number] PCP: Mario Brooks, MD  [facility] Providers Cardiologist:  Mario Batty, MD       Patient Profile:      Chief Complaint: Hospital follow-up for atrial fibrillation with RVR History of Present Illness:  Mario [name] is a 78 y.o. male with visit-pertinent history of atrial fibrillation, hypertension, hyperlipidemia, obstructive sleep apnea on CPAP, bronchiectasis, obesity  He is established with cardiology service on 12/2017 for atrial fibrillation.  ZIO monitor 12/2017 showed normal sinus rhythm with paroxysmal atrial fibrillation.  Lexiscan Myoview 04/2019 showed no ST segment deviation, no ischemia, and low risk study.  Echocardiogram 10/2023 showed LVEF 50-55%, no RWMA, mild LVH, RV function and size normal, normal PASP, left atrial size mildly dilated, no valvular abnormalities.  His last seen in office on 01/19/2024 by Dr. Allyson [name].  He had any chest pain but had some dyspnea.  He was seeing a pulmonologist for reactive airway disease and bronchiectasis.  He was found to be in atrial fibrillation and his heart rate in clinic was 115 bpm.  He does have history of paroxysmal atrial fibrillation now thought to be persistent atrial fibrillation.  His metoprolol was increased to 50 mg a day and he was to follow-up in 3 months.  He was admitted to the hospital from 02/15/2024 through 02/18/2024.  He presented to [facility], ED on 02/15/2024 with progressive dyspnea.  He was initially seen by his PCP in the morning at their office he was noted to be in rapid atrial fibrillation and sent to the ED for further evaluation.  Patient was placed on IV Cardizem drip for initial heart rate in the 150s with improvement of heart rate although systolic blood pressure dropped to the 90s and was subsequently discontinued.  His metoprolol succinate was uptitrated to 200 mg daily.  His home losartan was held  to allow adjustments of beta-blocker.   Discussed the use of AI scribe software for clinical note transcription with the patient, who gave verbal consent to proceed.  Patient presents to clinic today with persistent weakness and ongoing shortness of breath. He describes feeling "weak as a kitten" and needing to rest halfway up the stairs due to breathlessness. Even simple tasks such as getting up from the couch and walking into the kitchen leave him out of breath. He also mentions needing assistance to walk from the parking lot to the clinic due to the exertion. He uses a walker for steadiness.  Leg swelling has been well-controlled on Lasix.  He notes since his discharge home he has been unaware of his arrhythmia.  He denies any further symptoms of tachycardia.  He denies any chest pains, orthopnea, PND, palpitations, syncope, presyncope, fall, claudication.    Review of systems:  Please see the history of present illness. All other systems are reviewed and otherwise negative.     Home Medications:    Current Meds  Medication Sig   apixaban (ELIQUIS) 5 MG TABS tablet TAKE 1 TABLET BY MOUTH TWICE A DAY   Ascorbic Acid (VITAMIN C) 1000 MG tablet Take 1,000 mg by mouth 2 (two) times daily.   budesonide-formoterol (SYMBICORT) 160-4.5 MCG/ACT inhaler Inhale 2 puffs into the lungs in the morning and at bedtime. Generic medication   Cholecalciferol (VITAMIN D3) 125 MCG (5000 UT) TABS Take by mouth.   Coenzyme Q10 (CO Q-10) 100 MG CAPS Take 1 capsule by mouth  daily.   doxycycline (VIBRAMYCIN) 100 MG capsule Take 1 capsule (100 mg total) by mouth 2 (two) times daily for 7 days.   FIBER COMPLETE PO Take 1 tablet by mouth daily.   furosemide (LASIX) 40 MG tablet TAKE 1 TABLET (40 MG TOTAL) BY MOUTH DAILY. STOP HCTZ   levalbuterol (XOPENEX) 0.63 MG/3ML nebulizer solution Take 3 mLs (0.63 mg total) by nebulization every 4 (four) hours as needed for wheezing or shortness of breath.   metoprolol  succinate (TOPROL-XL) 100 MG 24 hr tablet Take 2 tablets (200 mg total) by mouth daily. Take with or immediately following a meal.   montelukast (SINGULAIR) 10 MG tablet Take 1 tablet (10 mg total) by mouth daily.   pantoprazole (PROTONIX) 40 MG tablet Take 1 tablet (40 mg total) by mouth 2 (two) times daily.   predniSONE (DELTASONE) 20 MG tablet 3 tabs poqday 1-2, 2 tabs poqday 3-4, 1 tab poqday 5-6   Respiratory Therapy Supplies (FLUTTER) DEVI Use as directed   rosuvastatin (CRESTOR) 20 MG tablet TAKE 1 TABLET BY MOUTH EVERY DAY **D/C LIPITOR**   valACYclovir (VALTREX) 1000 MG tablet Take 1 tablet (1,000 mg total) by mouth 3 (three) times daily.   Studies Reviewed:   EKG Interpretation Date/Time:  Tuesday March 12 2024 11:57:51 EDT Ventricular Rate:  98 PR Interval:    QRS Duration:  78 QT Interval:  336 QTC Calculation: 428 R Axis:   -27  Text Interpretation: Atrial fibrillation When compared with ECG of 15-Feb-2024 11:12, PREVIOUS ECG IS PRESENT Confirmed by Rise Paganini [phone]) on 03/12/2024 4:32:50 PM    Echocardiogram 10/31/2023 1. Left ventricular ejection fraction, by estimation, is 50 to 55%. The  left ventricle has low normal function. The left ventricle has no regional  wall motion abnormalities. There is mild left ventricular hypertrophy.  Left ventricular diastolic  parameters are indeterminate.   2. Right ventricular systolic function is normal. The right ventricular  size is normal. There is normal pulmonary artery systolic pressure. The  estimated right ventricular systolic pressure is 16.5 mmHg.   3. Left atrial size was mildly dilated.   4. The mitral valve is normal in structure. No evidence of mitral valve  regurgitation.   5. The aortic valve is tricuspid. Aortic valve regurgitation is not  visualized.   6. The inferior vena cava is normal in size with greater than 50%  respiratory variability, suggesting right atrial pressure of 3 mmHg.   Risk  Assessment/Calculations:    CHA2DS2-VASc Score = 4   This indicates a 4.8% annual risk of stroke. The patient's score is based upon: CHF History: 1 HTN History: 1 Diabetes History: 0 Stroke History: 0 Vascular Disease History: 0 Age Score: 2 Gender Score: 0            Physical Exam:   VS:  BP 130/84 (BP Location: Left Arm, Patient Position: Sitting, Cuff Size: Normal)   Pulse 98   Ht 5\' 9"  (1.753 m)   Wt 222 lb (100.7 kg)   BMI 32.78 kg/m    Wt Readings from Last 3 Encounters:  03/12/24 222 lb (100.7 kg)  03/10/24 224 lb (101.6 kg)  03/01/24 224 lb 12.8 oz (102 kg)    GEN: Well nourished, well developed in no acute distress NECK: No JVD; No carotid bruits CARDIAC: Irregularly irregular rhythm, no murmurs, rubs, gallops RESPIRATORY:  Clear to auscultation without rales, wheezing or rhonchi  ABDOMEN: Soft, non-tender, non-distended EXTREMITIES:  No edema; No acute deformity  Assessment and Plan:  Persistent atrial fibrillation Recently admitted on 01/2024 for A-fib with RVR.  Seems he has been in persistent atrial fibrillation for the past year. - He is cardiac unaware of his arrhythmia - His EKG today shows atrial fibrillation with heart rate 98 - His goal should be rate control at this time.  His HR at home has been well controlled since discharge home  - He will monitor his pulse at home with Kardia device and alert office for persistent heart rates greater than 100.  At that time Cardizem can be added to medication regimen for better rate control  - Continue Metoprolol XL 200 mg daily - Continue Eliquis 5 mg twice daily, no bleeding concerns  Dyspnea on exertion H/o bronchiectasis, managed by pulmonology - He does note significant exertional dyspnea that has been ongoing.  His DOE has worsened to the point where even simple tasks have become challenging for the patient.  He denies any exertional angina - He is without any overt heart failure symptoms at this time.   No leg swelling, orthopnea, PND - He does have prior Lexiscan Myoview 04/2019 that was negative and echocardiogram 10/2023 with LVEF 50 to 55%.  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  I feel as further ischemic evaluation is warranted to rule out cardiac as the common cause of his DOE  - Order for Cardiac PET stress placed today for further ischemic evaluation of his DOE - BMET today  Hypertension Blood pressure today is 130/84 and adequately controlled His losartan was previously held for adjustments of beta-blocker during recent admission.  Will continue to hold losartan at this time. - Continue to monitor blood pressure daily and alert office for persistent blood pressure readings greater than 130/80 - Continue Metoprolol XL 200 mg daily  Hyperlipidemia LDL 57 on 03/2021 Under adequate control at this time.  However we will need repeat fasting lipid panel during follow-up visit.  Has been primarily managed by his PCP - Continue Rosuvastatin 20 mg daily  Coronary artery calcification seen on CT scan CT chest 11/2023 did show three-vessel coronary artery calcifications          Informed Consent   Shared Decision Making/Informed Consent The risks [chest pain, shortness of breath, cardiac arrhythmias, dizziness, blood pressure fluctuations, myocardial infarction, stroke/transient ischemic attack, nausea, vomiting, allergic reaction, radiation exposure, metallic taste sensation and life-threatening complications (estimated to be 1 in 10,000)], benefits (risk stratification, diagnosing coronary artery disease, treatment guidance) and alternatives of a cardiac PET stress test were discussed in detail with Mr. Mcpeek and he agrees to proceed.     Dispo:  Return in about 3 months (around 06/12/2024).  Signed, Denyce Robert, NP

## 2024-03-13 ENCOUNTER — Ambulatory Visit (HOSPITAL_COMMUNITY): Admitting: Anesthesiology

## 2024-03-13 ENCOUNTER — Other Ambulatory Visit: Payer: Self-pay

## 2024-03-13 ENCOUNTER — Encounter (HOSPITAL_COMMUNITY): Payer: Self-pay | Admitting: Orthopedic Surgery

## 2024-03-13 ENCOUNTER — Encounter (HOSPITAL_COMMUNITY)
Admission: EM | Disposition: A | Discharge status: Home / Self Care | Payer: Self-pay | Source: Ambulatory Visit | Attending: Orthopedic Surgery

## 2024-03-13 ENCOUNTER — Ambulatory Visit (HOSPITAL_COMMUNITY)
Admission: EM | Admit: 2024-03-13 | Discharge: 2024-03-13 | Disposition: A | Discharge status: Home / Self Care | Source: Ambulatory Visit | Attending: Orthopedic Surgery | Admitting: Orthopedic Surgery

## 2024-03-13 DIAGNOSIS — I2510 Atherosclerotic heart disease of native coronary artery without angina pectoris: Secondary | ICD-10-CM | POA: Insufficient documentation

## 2024-03-13 DIAGNOSIS — L03012 Cellulitis of left finger: Secondary | ICD-10-CM | POA: Diagnosis not present

## 2024-03-13 DIAGNOSIS — J449 Chronic obstructive pulmonary disease, unspecified: Secondary | ICD-10-CM

## 2024-03-13 DIAGNOSIS — Z7901 Long term (current) use of anticoagulants: Secondary | ICD-10-CM | POA: Diagnosis not present

## 2024-03-13 DIAGNOSIS — M65042 Abscess of tendon sheath, left hand: Secondary | ICD-10-CM

## 2024-03-13 DIAGNOSIS — Z01818 Encounter for other preprocedural examination: Secondary | ICD-10-CM

## 2024-03-13 DIAGNOSIS — K219 Gastro-esophageal reflux disease without esophagitis: Secondary | ICD-10-CM | POA: Insufficient documentation

## 2024-03-13 DIAGNOSIS — L02512 Cutaneous abscess of left hand: Secondary | ICD-10-CM | POA: Insufficient documentation

## 2024-03-13 DIAGNOSIS — Z87891 Personal history of nicotine dependence: Secondary | ICD-10-CM | POA: Insufficient documentation

## 2024-03-13 DIAGNOSIS — Z7951 Long term (current) use of inhaled steroids: Secondary | ICD-10-CM | POA: Insufficient documentation

## 2024-03-13 DIAGNOSIS — L089 Local infection of the skin and subcutaneous tissue, unspecified: Secondary | ICD-10-CM | POA: Diagnosis not present

## 2024-03-13 DIAGNOSIS — I10 Essential (primary) hypertension: Secondary | ICD-10-CM

## 2024-03-13 DIAGNOSIS — I480 Paroxysmal atrial fibrillation: Secondary | ICD-10-CM | POA: Diagnosis not present

## 2024-03-13 DIAGNOSIS — I4891 Unspecified atrial fibrillation: Secondary | ICD-10-CM | POA: Insufficient documentation

## 2024-03-13 DIAGNOSIS — G4733 Obstructive sleep apnea (adult) (pediatric): Secondary | ICD-10-CM | POA: Insufficient documentation

## 2024-03-13 DIAGNOSIS — Z79899 Other long term (current) drug therapy: Secondary | ICD-10-CM | POA: Insufficient documentation

## 2024-03-13 HISTORY — DX: Bronchiectasis, uncomplicated: J47.9

## 2024-03-13 HISTORY — PX: INCISION AND DRAINAGE OF WOUND: SHX1803

## 2024-03-13 LAB — BASIC METABOLIC PANEL
BUN/Creatinine Ratio: 29 — ABNORMAL HIGH (ref 10–24)
BUN: 29 mg/dL — ABNORMAL HIGH (ref 8–27)
CO2: 21 mmol/L (ref 20–29)
Calcium: 9.6 mg/dL (ref 8.6–10.2)
Chloride: 104 mmol/L (ref 96–106)
Creatinine, Ser: 1 mg/dL (ref 0.76–1.27)
Glucose: 83 mg/dL (ref 70–99)
Potassium: 4.8 mmol/L (ref 3.5–5.2)
Sodium: 141 mmol/L (ref 134–144)
eGFR: 77 mL/min/{1.73_m2} (ref >59)

## 2024-03-13 SURGERY — IRRIGATION AND DEBRIDEMENT WOUND
Anesthesia: Regional | Site: Little Finger | Laterality: Left

## 2024-03-13 MED ORDER — PROPOFOL 500 MG/50ML IV EMUL
INTRAVENOUS | Status: DC | PRN
  Administered 2024-03-13: 25 ug/kg/min via INTRAVENOUS

## 2024-03-13 MED ORDER — ROPIVACAINE HCL 7.5 MG/ML IJ SOLN
INTRAMUSCULAR | Status: DC | PRN
  Administered 2024-03-13: 20 mL via PERINEURAL

## 2024-03-13 MED ORDER — LACTATED RINGERS IV SOLN: INTRAVENOUS | Status: DC

## 2024-03-13 MED ORDER — MEPIVACAINE HCL (PF) 1.5 % IJ SOLN
INTRAMUSCULAR | Status: DC | PRN
  Administered 2024-03-13: 10 mL via PERINEURAL

## 2024-03-13 MED ORDER — ORAL CARE MOUTH RINSE: 15.0000 mL | Freq: Once | OROMUCOSAL | Status: AC

## 2024-03-13 MED ORDER — FENTANYL CITRATE (PF) 100 MCG/2ML IJ SOLN
INTRAMUSCULAR | Status: DC | PRN
  Administered 2024-03-13: 50 ug via INTRAVENOUS

## 2024-03-13 MED ORDER — FENTANYL CITRATE (PF) 100 MCG/2ML IJ SOLN: 25.0000 ug | INTRAMUSCULAR | Status: DC | PRN

## 2024-03-13 MED ORDER — CHLORHEXIDINE GLUCONATE 0.12 % MT SOLN
15.0000 mL | Freq: Once | OROMUCOSAL | Status: AC
  Administered 2024-03-13: 15 mL via OROMUCOSAL

## 2024-03-13 MED ORDER — FENTANYL CITRATE (PF) 100 MCG/2ML IJ SOLN
INTRAMUSCULAR | Status: AC
  Filled 2024-03-13: qty 2

## 2024-03-13 MED ORDER — ONDANSETRON HCL 4 MG/2ML IJ SOLN: 4.0000 mg | Freq: Once | INTRAMUSCULAR | Status: DC | PRN

## 2024-03-13 MED ORDER — BUPIVACAINE HCL (PF) 0.25 % IJ SOLN
INTRAMUSCULAR | Status: AC
  Filled 2024-03-13: qty 30

## 2024-03-13 MED ORDER — MIDAZOLAM HCL 2 MG/2ML IJ SOLN
INTRAMUSCULAR | Status: AC
  Filled 2024-03-13: qty 2

## 2024-03-13 SURGICAL SUPPLY — 50 items
BAG COUNTER SPONGE SURGICOUNT (BAG) ×2 IMPLANT
BNDG COHESIVE 1X5 TAN STRL LF (GAUZE/BANDAGES/DRESSINGS) IMPLANT
BNDG ELASTIC 3INX 5YD STR LF (GAUZE/BANDAGES/DRESSINGS) ×2 IMPLANT
BNDG ELASTIC 4INX 5YD STR LF (GAUZE/BANDAGES/DRESSINGS) IMPLANT
BNDG ELASTIC 4X5.8 VLCR STR LF (GAUZE/BANDAGES/DRESSINGS) ×2 IMPLANT
BNDG ESMARK 4X9 LF (GAUZE/BANDAGES/DRESSINGS) ×2 IMPLANT
BNDG GAUZE DERMACEA FLUFF 4 (GAUZE/BANDAGES/DRESSINGS) ×2 IMPLANT
CORD BIPOLAR FORCEPS 12FT (ELECTRODE) ×2 IMPLANT
COVER SURGICAL LIGHT HANDLE (MISCELLANEOUS) ×2 IMPLANT
CUFF TOURN SGL QUICK 18X4 (TOURNIQUET CUFF) ×2 IMPLANT
CUFF TRNQT CYL 24X4X16.5-23 (TOURNIQUET CUFF) IMPLANT
DRAIN PENROSE 12X.25 LTX STRL (MISCELLANEOUS) IMPLANT
DRAPE SURG 17X23 STRL (DRAPES) ×2 IMPLANT
DRSG ADAPTIC 3X8 NADH LF (GAUZE/BANDAGES/DRESSINGS) ×2 IMPLANT
ELECT REM PT RETURN 9FT ADLT (ELECTROSURGICAL) IMPLANT
ELECTRODE REM PT RTRN 9FT ADLT (ELECTROSURGICAL) IMPLANT
GAUZE SPONGE 4X4 12PLY STRL (GAUZE/BANDAGES/DRESSINGS) ×2 IMPLANT
GAUZE STRETCH 2X75IN STRL (MISCELLANEOUS) IMPLANT
GAUZE XEROFORM 1X8 LF (GAUZE/BANDAGES/DRESSINGS) ×2 IMPLANT
GAUZE XEROFORM 5X9 LF (GAUZE/BANDAGES/DRESSINGS) IMPLANT
GLOVE BIOGEL PI IND STRL 8.5 (GLOVE) ×2 IMPLANT
GLOVE SURG ORTHO 8.0 STRL STRW (GLOVE) ×2 IMPLANT
GOWN STRL REUS W/ TWL LRG LVL3 (GOWN DISPOSABLE) ×6 IMPLANT
GOWN STRL REUS W/ TWL XL LVL3 (GOWN DISPOSABLE) ×2 IMPLANT
KIT BASIN OR (CUSTOM PROCEDURE TRAY) ×2 IMPLANT
KIT TURNOVER KIT B (KITS) ×2 IMPLANT
MANIFOLD NEPTUNE II (INSTRUMENTS) ×2 IMPLANT
NDL HYPO 25GX1X1/2 BEV (NEEDLE) IMPLANT
NEEDLE HYPO 25GX1X1/2 BEV (NEEDLE) IMPLANT
NS IRRIG 1000ML POUR BTL (IV SOLUTION) ×2 IMPLANT
PACK ORTHO EXTREMITY (CUSTOM PROCEDURE TRAY) ×2 IMPLANT
PAD ARMBOARD POSITIONER FOAM (MISCELLANEOUS) ×4 IMPLANT
PAD CAST 4YDX4 CTTN HI CHSV (CAST SUPPLIES) ×2 IMPLANT
SET CYSTO W/LG BORE CLAMP LF (SET/KITS/TRAYS/PACK) IMPLANT
SET HNDPC FAN SPRY TIP SCT (DISPOSABLE) IMPLANT
SOAP 2 % CHG 4 OZ (WOUND CARE) ×2 IMPLANT
SPONGE T-LAP 18X18 ~~LOC~~+RFID (SPONGE) ×2 IMPLANT
SPONGE T-LAP 4X18 ~~LOC~~+RFID (SPONGE) ×2 IMPLANT
SUT ETHILON 4 0 PS 2 18 (SUTURE) IMPLANT
SUT NYLON ETHILON 5-0 P-3 1X18 (SUTURE) IMPLANT
SUT PROLENE 4 0 PS 2 18 (SUTURE) IMPLANT
SWAB COLLECTION DEVICE MRSA (MISCELLANEOUS) ×2 IMPLANT
SWAB CULTURE ESWAB REG 1ML (MISCELLANEOUS) IMPLANT
SYR CONTROL 10ML LL (SYRINGE) IMPLANT
TOWEL GREEN STERILE (TOWEL DISPOSABLE) ×2 IMPLANT
TOWEL GREEN STERILE FF (TOWEL DISPOSABLE) ×2 IMPLANT
TUBE CONNECTING 12X1/4 (SUCTIONS) ×2 IMPLANT
UNDERPAD 30X36 HEAVY ABSORB (UNDERPADS AND DIAPERS) ×2 IMPLANT
WATER STERILE IRR 1000ML POUR (IV SOLUTION) ×2 IMPLANT
YANKAUER SUCT BULB TIP NO VENT (SUCTIONS) ×2 IMPLANT

## 2024-03-13 NOTE — Discharge Instructions (Signed)
 KEEP BANDAGE CLEAN AND DRY CALL OFFICE FOR F/U APPT [phone] ON FRIDAY  KEEP HAND ELEVATED ABOVE HEART OK TO APPLY ICE TO OPERATIVE AREA CONTACT OFFICE IF ANY WORSENING PAIN OR CONCERNS.

## 2024-03-13 NOTE — Progress Notes (Signed)
 Pt added on for surgery with Dr. Melvyn Novas. After speaking with the pt he sts he last ate a steak burger ar 1300 today. He is also on Eliquis in which his last dose was 03/12/24 in the am. Dr. Joline Salt with anesthesia made aware, and sts that the surgery can not take place until 8hrs after the meal. Dr. Melvyn Novas also made aware.

## 2024-03-13 NOTE — Op Note (Signed)
 PREOPERATIVE DIAGNOSIS: Left small finger abscess  POSTOPERATIVE DIAGNOSIS: Same  ATTENDING SURGEON: Dr. Bradly Bienenstock who scrubbed and present for the entire procedure  ASSISTANT SURGEON: None  ANESTHESIA: Regional with IV sedation  OPERATIVE PROCEDURE: Left small finger complex incision and drainage Left small finger flexor sheath exploration and drainage  IMPLANTS: None  EBL: Minimal  RADIOGRAPHIC INTERPRETATION: None  SURGICAL INDICATIONS: Patient is a right-hand-dominant gentleman with a worsening infection to his left small finger.  Patient was seen and evaluated recommend undergo the above procedure.  Risks of surgery include but not limited to bleeding infection damage nearby nerves arteries or tendons loss of motion of the wrist and digits incomplete relief of symptoms and need for further surgical invention.  SURGICAL TECHNIQUE: The patient was palpated fine in the preoperative holding area marked apart a marker made in the left small finger to indicate correct operative site.  The patient then brought back the operating placed supine on the anesthesia table where the regional anesthetic was administered.  Patient tolerated this well.  Preoperative antibiotics were given prior to skin incision.  Attention was then turned to the left small finger.  Incision was extended proximally and distally.  Dissection was then carried down through the skin and subcutaneous tissue where the abscess area was then drained.  The patient did have the gross purulence coming from the abscess area.  Once this was carried out excisional debridement of the skin and subcutaneous tissue and the devitalized tissue was then carried out.  The flexor sheath was then explored.  The patient did have some mild fluid from the flexor sheath did not reveal any gross purulence.  The flexor sheath was then copiously irrigated.  After thorough wound irrigation and tendon sheath irrigation the wound was loosely tacked and  closed with simple Prolene sutures.  Patient tolerated this well.  Adaptic dressing sterile compressive bandage then applied.  Patient tolerated the procedure well.  POSTOPERATIVE PLAN: Patient be discharged home.  See him back in the office in 2 days for wound check whirlpool aggressive therapy over the next week.  Continue on the oral antibiotics.

## 2024-03-13 NOTE — Anesthesia Preprocedure Evaluation (Addendum)
 Anesthesia Evaluation  Patient identified by MRN, date of birth, ID band Patient awake    Reviewed: Allergy & Precautions, NPO status , Patient's Chart, lab work & pertinent test results, reviewed documented beta blocker date and time   Airway Mallampati: II  TM Distance: >3 FB Neck ROM: Full    Dental  (+) Teeth Intact, Dental Advisory Given   Pulmonary asthma , sleep apnea , COPD,  COPD inhaler, former smoker   Pulmonary exam normal breath sounds clear to auscultation       Cardiovascular hypertension, Pt. on home beta blockers + CAD  + dysrhythmias Atrial Fibrillation  Rhythm:Irregular Rate:Abnormal     Neuro/Psych negative neurological ROS     GI/Hepatic Neg liver ROS,GERD  Medicated,,  Endo/Other  Obesity   Renal/GU      Musculoskeletal  abscess left little finger   Abdominal   Peds  Hematology  (+) Blood dyscrasia (Eliquis)   Anesthesia Other Findings Day of surgery medications reviewed with the patient.  Reproductive/Obstetrics                             Anesthesia Physical Anesthesia Plan  ASA: 3  Anesthesia Plan: Regional   Post-op Pain Management: Tylenol PO (pre-op)*   Induction: Intravenous  PONV Risk Score and Plan: 2 and Dexamethasone, Ondansetron and TIVA  Airway Management Planned: Natural Airway and Simple Face Mask  Additional Equipment:   Intra-op Plan:   Post-operative Plan:   Informed Consent: I have reviewed the patients History and Physical, chart, labs and discussed the procedure including the risks, benefits and alternatives for the proposed anesthesia with the patient or authorized representative who has indicated his/her understanding and acceptance.     Dental advisory given  Plan Discussed with: CRNA  Anesthesia Plan Comments:        Anesthesia Quick Evaluation

## 2024-03-13 NOTE — Anesthesia Procedure Notes (Signed)
 Procedure Name: MAC Date/Time: 03/13/2024 9:17 PM  Performed by: Randon Goldsmith, CRNAPre-anesthesia Checklist: Patient identified, Emergency Drugs available, Suction available and Patient being monitored Patient Re-evaluated:Patient Re-evaluated prior to induction Oxygen Delivery Method: Simple face mask Preoxygenation: Pre-oxygenation with 100% oxygen

## 2024-03-13 NOTE — Anesthesia Postprocedure Evaluation (Signed)
 Anesthesia Post Note  Patient: Mario [name]  Procedure(s) Performed: IRRIGATION AND DEBRIDEMENT WOUND (Left: Little Finger)     Patient location during evaluation: PACU Anesthesia Type: Regional Level of consciousness: awake and alert Pain management: pain level controlled Vital Signs Assessment: post-procedure vital signs reviewed and stable Respiratory status: spontaneous breathing, nonlabored ventilation and respiratory function stable Cardiovascular status: stable and blood pressure returned to baseline (A-fib) Postop Assessment: no apparent nausea or vomiting Anesthetic complications: no   No notable events documented.  Last Vitals:  Vitals:   03/13/24 2200 03/13/24 2215  BP: (!) 123/102 115/80  Pulse: (!) 118 (!) 119  Resp: 13 16  Temp:    SpO2: 100% 99%    Last Pain:  Vitals:   03/13/24 2146  TempSrc:   PainSc: 0-No pain                 Collene Schlichter

## 2024-03-13 NOTE — Transfer of Care (Signed)
 Immediate Anesthesia Transfer of Care Note  Patient: Mario [name]  Procedure(s) Performed: IRRIGATION AND DEBRIDEMENT WOUND (Left: Little Finger)  Patient Location: PACU  Anesthesia Type:MAC combined with regional for post-op pain  Level of Consciousness: awake, alert , and oriented  Airway & Oxygen Therapy: Patient Spontanous Breathing and Patient connected to face mask oxygen  Post-op Assessment: Report given to RN and Post -op Vital signs reviewed and stable  Post vital signs: Reviewed and stable  Last Vitals:  Vitals Value Taken Time  BP 141/103 03/13/24 2147  Temp    Pulse 135 03/13/24 2148  Resp 34 03/13/24 2149  SpO2 93 % 03/13/24 2148  Vitals shown include unfiled device data.  Last Pain:  Vitals:   03/13/24 1841  TempSrc: Oral  PainSc: 0-No pain         Complications: No notable events documented.

## 2024-03-13 NOTE — Anesthesia Procedure Notes (Signed)
 Anesthesia Regional Block: Axillary brachial plexus block   Pre-Anesthetic Checklist: , timeout performed,  Correct Patient, Correct Site, Correct Laterality,  Correct Procedure, Correct Position, site marked,  Risks and benefits discussed,  Surgical consent,  Pre-op evaluation,  At surgeon's request and post-op pain management  Laterality: Left  Prep: chloraprep       Needles:  Injection technique: Single-shot  Needle Type: Echogenic Needle     Needle Length: 9cm  Needle Gauge: 21     Additional Needles:   Procedures:,,,, ultrasound used (permanent image in chart),,    Narrative:  Start time: 03/13/2024 8:50 PM End time: 03/13/2024 8:57 PM Injection made incrementally with aspirations every 5 mL.  Performed by: Personally  Anesthesiologist: Collene Schlichter, MD  Additional Notes: No pain on injection. No increased resistance to injection. Injection made in 5cc increments.  Good needle visualization.  Patient tolerated procedure well.

## 2024-03-13 NOTE — H&P (Signed)
 Mario [name] is an 78 y.o. male.   Chief Complaint: Patient is a right-hand-dominant gentleman with the worsening left small finger infection.  Patient was seen and evaluated in the outpatient clinic today and was referred today for further consultation. HPI: Patient had been in the emergency department on 323.  The patient underwent local irrigation and debridement but unfortunately his condition worsen.  Past Medical History:  Diagnosis Date   Asthma    Atrial fibrillation (HCC)    Biliary colic 2014   Bronchiectasis (HCC)    2021   COPD (chronic obstructive pulmonary disease) with chronic bronchitis (HCC)    Coronary artery calcification seen on CAT scan    Cyst of pancreas    1 cm (7/18), repeat ct in 12 months.     Hyperlipidemia    Hypertension    Obstructive sleep apnea    Renal disorder    Rhinitis    Sleep apnea    pt denies sleep apnea-lost weight    Past Surgical History:  Procedure Laterality Date   COLONOSCOPY  09/29/2014   LAPAROSCOPIC CHOLECYSTECTOMY SINGLE SITE WITH INTRAOPERATIVE CHOLANGIOGRAM N/A 09/15/2019   Procedure: LAPAROSCOPIC CHOLECYSTECTOMY SINGLE SITE;  Surgeon: Karie Soda, MD;  Location: [facility];  Service: General;  Laterality: N/A;   LITHOTRIPSY  2000   for kidney stone    Family History  Problem Relation Age of Onset   Rheumatic fever Mother    Stroke Father    Atrial fibrillation Father    Colon polyps Father    Arthritis Sister    Colon cancer Neg Hx    Esophageal cancer Neg Hx    Rectal cancer Neg Hx    Stomach cancer Neg Hx    Social History:  reports that he quit smoking about 49 years ago. His smoking use included cigarettes. He started smoking about 60 years ago. He has a 22 pack-year smoking history. He has never used smokeless tobacco. He reports that he does not currently use alcohol after a past usage of about 1.0 standard drink of alcohol per week. He reports that he does not use drugs.  Allergies:  Allergies  Allergen  Reactions   Dust Mite Extract Other (See Comments)    Medications Prior to Admission  Medication Sig Dispense Refill   acetaminophen (TYLENOL) 500 MG tablet Take 1,000 mg by mouth every 6 (six) hours as needed.     apixaban (ELIQUIS) 5 MG TABS tablet TAKE 1 TABLET BY MOUTH TWICE A DAY 60 tablet 5   Ascorbic Acid (VITAMIN C) 1000 MG tablet Take 1,000 mg by mouth 2 (two) times daily.     budesonide-formoterol (SYMBICORT) 160-4.5 MCG/ACT inhaler Inhale 2 puffs into the lungs in the morning and at bedtime. Generic medication 1 each 6   Cholecalciferol (VITAMIN D3) 125 MCG (5000 UT) TABS Take by mouth.     Coenzyme Q10 (CO Q-10) 100 MG CAPS Take 1 capsule by mouth daily.     doxycycline (VIBRAMYCIN) 100 MG capsule Take 1 capsule (100 mg total) by mouth 2 (two) times daily for 7 days. 14 capsule 0   FIBER COMPLETE PO Take 1 tablet by mouth daily.     furosemide (LASIX) 40 MG tablet TAKE 1 TABLET (40 MG TOTAL) BY MOUTH DAILY. STOP HCTZ 30 tablet 3   levalbuterol (XOPENEX) 0.63 MG/3ML nebulizer solution Take 3 mLs (0.63 mg total) by nebulization every 4 (four) hours as needed for wheezing or shortness of breath. 360 mL 2   metoprolol  succinate (TOPROL-XL) 100 MG 24 hr tablet Take 2 tablets (200 mg total) by mouth daily. Take with or immediately following a meal. 180 tablet 3   montelukast (SINGULAIR) 10 MG tablet Take 1 tablet (10 mg total) by mouth daily. 90 tablet 2   pantoprazole (PROTONIX) 40 MG tablet Take 1 tablet (40 mg total) by mouth 2 (two) times daily. 180 tablet 2   predniSONE (DELTASONE) 20 MG tablet 3 tabs poqday 1-2, 2 tabs poqday 3-4, 1 tab poqday 5-6 12 tablet 0   rosuvastatin (CRESTOR) 20 MG tablet TAKE 1 TABLET BY MOUTH EVERY DAY **D/C LIPITOR** 90 tablet 2   valACYclovir (VALTREX) 1000 MG tablet Take 1 tablet (1,000 mg total) by mouth 3 (three) times daily. 21 tablet 0   Respiratory Therapy Supplies (FLUTTER) DEVI Use as directed 1 each 0    Results for orders placed or performed  in visit on 03/12/24 (from the past 48 hours)  Basic metabolic panel     Status: Abnormal   Collection Time: 03/12/24 12:39 PM  Result Value Ref Range   Glucose 83 70 - 99 mg/dL   BUN 29 (H) 8 - 27 mg/dL   Creatinine, Ser 0.45 0.76 - 1.27 mg/dL   eGFR 77 >40 JW/JXB/1.47   BUN/Creatinine Ratio 29 (H) 10 - 24   Sodium 141 134 - 144 mmol/L   Potassium 4.8 3.5 - 5.2 mmol/L   Chloride 104 96 - 106 mmol/L   CO2 21 20 - 29 mmol/L   Calcium 9.6 8.6 - 10.2 mg/dL   No results found.  ROS no recent illnesses or hospitalizations.  Blood pressure 121/79, temperature (!) 97.5 F (36.4 C), temperature source Oral, resp. rate 17, SpO2 94%. Physical Exam  General Appearance:  Alert, cooperative, no distress, appears stated age  Head:  Normocephalic, without obvious abnormality, atraumatic  Eyes:  Pupils equal, conjunctiva/corneas clear,         Throat: Lips, mucosa, and tongue normal; teeth and gums normal  Neck: No visible masses     Lungs:   respirations unlabored  Chest Wall:  No tenderness or deformity  Heart:  Regular rate and rhythm,  Abdomen:   Soft, non-tender,         Extremities: The patient's bandages in place.  His fingers are warm well-perfused good capillary refill good blood flow.  No ascending erythema or lymphangitis.  Patient does have the erythema over the dorsal aspect of the hand but does not extend above the wrist crease.  Pulses: 2+ and symmetric  Skin: Skin color, texture, turgor normal, no rashes or lesions     Neurologic: Normal     Assessment/Plan Left small finger abscess  Left small finger incision and drainage and flexor sheath exploration  R/B/A DISCUSSED WITH PT IN HOSPITAL  PT VOICED UNDERSTANDING OF PLAN CONSENT SIGNED DAY OF SURGERY PT SEEN AND EXAMINED PRIOR TO OPERATIVE PROCEDURE/DAY OF SURGERY SITE MARKED. QUESTIONS ANSWERED WILL GO HOME FOLLOWING SURGERY   WE ARE PLANNING SURGERY FOR YOUR UPPER EXTREMITY. THE RISKS AND BENEFITS OF SURGERY  INCLUDE BUT NOT LIMITED TO BLEEDING INFECTION, DAMAGE TO NEARBY NERVES ARTERIES TENDONS, FAILURE OF SURGERY TO ACCOMPLISH ITS INTENDED GOALS, PERSISTENT SYMPTOMS AND NEED FOR FURTHER SURGICAL INTERVENTION. WITH THIS IN MIND WE WILL PROCEED. I HAVE DISCUSSED WITH THE PATIENT THE PRE AND POSTOPERATIVE REGIMEN AND THE DOS AND DON'TS. PT VOICED UNDERSTANDING AND INFORMED CONSENT SIGNED.   Sharma Covert 03/13/2024, 8:59 PM

## 2024-03-14 ENCOUNTER — Encounter (HOSPITAL_COMMUNITY): Payer: Self-pay | Admitting: Orthopedic Surgery

## 2024-03-14 ENCOUNTER — Ambulatory Visit: Payer: No Typology Code available for payment source | Admitting: Internal Medicine

## 2024-03-15 DIAGNOSIS — M79645 Pain in left finger(s): Secondary | ICD-10-CM | POA: Diagnosis not present

## 2024-03-18 DIAGNOSIS — M79645 Pain in left finger(s): Secondary | ICD-10-CM | POA: Diagnosis not present

## 2024-03-19 DIAGNOSIS — M79645 Pain in left finger(s): Secondary | ICD-10-CM | POA: Diagnosis not present

## 2024-03-20 ENCOUNTER — Ambulatory Visit: Payer: Self-pay | Admitting: Internal Medicine

## 2024-03-20 DIAGNOSIS — M79645 Pain in left finger(s): Secondary | ICD-10-CM | POA: Diagnosis not present

## 2024-03-20 NOTE — Telephone Encounter (Signed)
 Chief Complaint: SOB Symptoms: SOB with exertion Frequency: unknown Pertinent Negatives: Patient denies fever, URI sx, CP, severe SOB Disposition: [] ED /[x] Urgent Care (no appt availability in office) / [] Appointment(In office/virtual)/ []  [facility]/ [] Home Care/ [] Refused Recommended Disposition /[]  Mobile Bus/ [x]  Follow-up with PCP Additional Notes: Pt c/o worsening SOB with exertion. Pt endorses taking all respiratory treatments as prescribed as well as rescue INH/neb with relief. Triager attempted to schedule pt at pulm office per protocol, but pt declined stating that he had therapy to go to today. Triager then attempted to schedule with PCP, but pt did not want to see any other MD aside from PCP. Due to pt preference and lack of access, triager was unable to schedule appt per protocol. Triager strongly recommended going to UC at earliest convenience for evaluation/tx and triager offered to schedule for him. Pt stated that he'll "pass".    Copied from CRM [id number]. Topic: Clinical - Red Word Triage >> Mar 20, 2024  2:46 PM Mario [name] wrote: Red Word that prompted transfer to Nurse Triage: sob/getting worse Reason for Disposition  [1] MILD difficulty breathing (e.g., minimal/no SOB at rest, SOB with walking, pulse <100) AND [2] NEW-onset or WORSE than normal  Answer Assessment - Initial Assessment Questions E2C2 Pulmonary Triage - Initial Assessment Questions "Chief Complaint (e.g., cough, sob, wheezing, fever, chills, sweat or additional symptoms) *Go to specific symptom protocol after initial questions. SOB  "How long have symptoms been present?" "It started a while ago on and off" Reports had to see "emergency surgery" and it has gotten worse  Have you tested for COVID or Flu? Note: If not, ask patient if a home test can be taken. If so, instruct patient to call back for positive results. No, reports "catching a lung thing" a few weeks ago at the hospital -  reports given abx (still currently taking)  MEDICINES:   "Have you used any OTC meds to help with symptoms?" Yes If yes, ask "What medications?" Tylenol PRN - with some relief  "Have you used your inhalers/maintenance medication?" Yes If yes, "What medications?" Symbicort - 2 puffs twice daily  Rescue INH - last used this AM Neb - twice a day consistently for past few days  If inhaler, ask "How many puffs and how often?" Note: Review instructions on medication in the chart. See above  OXYGEN: "Do you wear supplemental oxygen?" No If yes, "How many liters are you supposed to use?" N/a  "Do you monitor your oxygen levels?" Yes If yes, "What is your reading (oxygen level) today?" Last time was 95 - a few days ago Reports "its always good"  "What is your usual oxygen saturation reading?"  (Note: Pulmonary O2 sats should be 90% or greater) Mid 90s    3. PATTERN "Does the difficult breathing come and go, or has it been constant since it started?"      Pretty constant 4. SEVERITY: "How bad is your breathing?" (e.g., mild, moderate, severe)    - MILD: No SOB at rest, mild SOB with walking, speaks normally in sentences, can lie down, no retractions, pulse < 100.    - MODERATE: SOB at rest, SOB with minimal exertion and prefers to sit, cannot lie down flat, speaks in phrases, mild retractions, audible wheezing, pulse 100-120.    - SEVERE: Very SOB at rest, speaks in single words, struggling to breathe, sitting hunched forward, retractions, pulse > 120      Mild, SOB with exertion Triager does  not appreciate any audible wheezing/SOB and pt is speaking in full sentences 5. RECURRENT SYMPTOM: "Have you had difficulty breathing before?" If Yes, ask: "When was the last time?" and "What happened that time?"      Yes, reports tx with prednisone and abx 6. CARDIAC HISTORY: "Do you have any history of heart disease?" (e.g., heart attack, angina, bypass surgery, angioplasty)      Afib, hx or  RVR 7. LUNG HISTORY: "Do you have any history of lung disease?"  (e.g., pulmonary embolus, asthma, emphysema)     Bronchiectasis asthma 8. CAUSE: "What do you think is causing the breathing problem?"      Allergies? 9. OTHER SYMPTOMS: "Do you have any other symptoms? (e.g., dizziness, runny nose, cough, chest pain, fever)     Weakness Denies all others  Protocols used: Breathing Difficulty-A-AH

## 2024-03-21 DIAGNOSIS — M79645 Pain in left finger(s): Secondary | ICD-10-CM | POA: Diagnosis not present

## 2024-03-22 ENCOUNTER — Other Ambulatory Visit: Payer: Self-pay | Admitting: Family Medicine

## 2024-03-22 ENCOUNTER — Encounter: Payer: Self-pay | Admitting: Family Medicine

## 2024-03-22 ENCOUNTER — Ambulatory Visit (INDEPENDENT_AMBULATORY_CARE_PROVIDER_SITE_OTHER): Admitting: Family Medicine

## 2024-03-22 VITALS — BP 116/62 | HR 103 | Temp 97.9°F | Ht 69.0 in | Wt 217.2 lb

## 2024-03-22 DIAGNOSIS — R0602 Shortness of breath: Secondary | ICD-10-CM

## 2024-03-22 DIAGNOSIS — J4489 Other specified chronic obstructive pulmonary disease: Secondary | ICD-10-CM | POA: Diagnosis not present

## 2024-03-22 DIAGNOSIS — J45901 Unspecified asthma with (acute) exacerbation: Secondary | ICD-10-CM

## 2024-03-22 DIAGNOSIS — M79645 Pain in left finger(s): Secondary | ICD-10-CM | POA: Diagnosis not present

## 2024-03-22 MED ORDER — DOXYCYCLINE HYCLATE 100 MG PO TABS: 100.0000 mg | ORAL_TABLET | Freq: Two times a day (BID) | ORAL | 0 refills | Status: DC

## 2024-03-22 MED ORDER — PREDNISONE 10 MG PO TABS: 10.0000 mg | ORAL_TABLET | Freq: Every day | ORAL | 1 refills | Status: DC

## 2024-03-22 NOTE — Progress Notes (Signed)
 Subjective:    Patient ID: Mario [name], male    DOB: October 26, 1946, 78 y.o.   MRN: [id number] Admit date: 02/15/2024 Discharge date: 02/18/2024   Admitted From: Home Disposition: Home   Recommendations for Outpatient Follow-up:  Follow up with PCP in 1-2 weeks Follow-up with cardiology, Dr. Gery Pray in 1-2 weeks Metoprolol succinate increased to 200 mg p.o. daily for A-fib with RVR Discontinue losartan to allow up titration of metoprolol Discontinued albuterol as likely precipitating factor to his rapid ventricular response, substituted with Xopenex   Home Health: No Equipment/Devices: None   Discharge Condition: Stable CODE STATUS: Full code Diet recommendation: Heart healthy diet   History of present illness:   Mario [name] is a 78 y.o. male with past medical history significant for HTN, HLD, persistent atrial fibrillation on Eliquis, chronic bronchiectasis who presented to [facility] ED on 02/15/2024 with progressive dyspnea.  He initially was seen by his PCP in the morning, at their office he was noted to be in rapid A-fib and sent to the ED for further evaluation.  Patient denies any fevers, no chills, no nausea/vomiting/diarrhea, no chest pain, no syncope or lightheadedness.  He reports is overall compliant with his medications but occasionally may miss a dose of Eliquis here and there.   In the ED, temperature 98.5 F, HR 154, RR 16, BP 107/76, SpO2 97% on room air.  WBC 12.5, hemoglobin 11.3, platelet count 360.  Sodium 138, potassium 3.9, chloride 106, CO2 22, glucose 97, BUN 30, creatinine 1.09.  Chest x-ray with stable left retrocardiac opacity, no acute cardiopulmonary disease process.  Patient was placed on IV Cardizem drip, with improvement of heart rate although his systolic blood pressure dropped into the 90s and was subsequently discontinued.  TRH consulted for admission for further evaluation management of A-fib with RVR.   Hospital course:   Persistent  atrial fibrillation with RVR Patient presenting to the ED with progressive exertional dyspnea, found to be in A-fib with RVR with heart rates in the 150s.  Initially placed on Cardizem drip with improvement although also became slightly hypotensive and Cardizem drip was discontinued.  At baseline on metoprolol succinate 50 mg p.o. daily.  TSH within normal limits.  Potentially worsen with home use of albuterol.  Follows with cardiology outpatient, Dr. Allyson Sabal.  Metoprolol succinate was uptitrated to 200 mg p.o. daily.  Continue Eliquis 5 mg p.o. twice daily.  Outpatient follow-up with cardiology.   Musculoskeletal back pain Patient complains of pain to his lumbar paraspinal region bilaterally.  No history of recent trauma.  Has had side effect with tizanidine in the past.  Started on Robaxin and tramadol as needed.  Outpatient follow-up with orthopedics.   Essential hypertension Metoprolol succinate increased to 200 mg p.o. daily as above. Holding home losartan to allow adjustments of beta-blocker. Continue furosemide 40 mg p.o. daily   Hyperlipidemia Continue Crestor 20 mg p.o. daily   Chronic bronchiectasis Continue Symbicort, Singulair.  Albuterol discontinued in favor of Xopenex.  Continue ciprofloxacin as prescribed by outpatient provider for 10-day treatment of acute on chronic bronchiectasis.  Outpatient follow-up with pulmonology.   Obesity, class II Body mass index is 34.71 kg/m.    Discharge Diagnoses:  Principal Problem:   Atrial fibrillation with rapid ventricular response (HCC)  03/01/24 Patient states that his heart rate is doing much better.  Today he is in the 90s.  He is unable to locate 200 mg metoprolol.  Therefore I am sending him 100 mg  Toprol pills and he can take 2 a day.  He is currently using Symbicort as maintenance therapy.  After leaving the hospital he had an additional COPD exacerbation and required Levaquin prednisone to treat.  He is nauseous for about  consistently.  He is taking Mucinex consistently.  He continues to endorse severe shortness of breath with activity.  He states that he is having to stop walking up steps to catch his breath.  On high-resolution CT scan of the chest last year he did have the mention of coronary artery disease.  He denies any chest pain but he does report chest tightness with activity that he attributes to his asthma.  At that time, my plan was: Complicated situation.  Patient has COPD complicated with bronchiectasis.  I recommended adding Spiriva 2.5 mg, 2 inhalations daily in addition to his Symbicort to try to improve his breathing.  I recommended that he use his flutter valve more consistently and frequently to help dislodge the mucus in his airways and improve his bronchiectasis.  Continue to use Mucinex 1200 mg twice daily.  Use Xopenex only for breakthrough exacerbations.  Heart rate is now controlled on Toprol-XL 200 mg a day.  However the patient continues to have dyspnea on exertion.  I have recommended that he discuss a stress test with his cardiologist when he meets with them next week.  He has coronary artery disease mentioned on the CAT scan but we do not know the extent.  Patient may have a flow-limiting lesion that may be contributing some to his dyspnea on exertion.  Earlier this year he had an elevated BNP which I presumed his diastolic heart failure related.  I want to ensure that we have evaluated all possible causes of shortness of breath and dyspnea on exertion  03/22/24 Since I last saw the patient, he developed a serious infection in his left fifth digit.  He is currently on doxycycline 100 mg twice daily for the skin infection in his left hand.  He is seeing orthopedics for debridement and wound healing in the left hand.  He is concerned that he is going to lose his left fifth digit.  However, his breathing has deteriorated again.  He states that over the last 2 weeks he started having severe shortness of  breath and wheezing and coughing and chest congestion.  He has seen cardiology and they have scheduled the patient for a stress test this summer but they do not believe that there is any underlying coronary artery ischemia causing his shortness of breath.  The patient independently started taking a prednisone taper pack in addition to his doxycycline due to the severity of his shortness of breath and chest congestion.  He is on day 4 today 40 mg of prednisone.  He states that his breathing is much better today.  He is also using Xopenex 2-3 times a day.  On exam today, he has rhonchorous breath sounds in all 4 lung fields right greater than left.  He has diminished breath sounds in all 4 lung fields with faint expiratory wheezing. Past Medical History:  Diagnosis Date   Asthma    Atrial fibrillation (HCC)    Biliary colic 2014   Bronchiectasis (HCC)    2021   COPD (chronic obstructive pulmonary disease) with chronic bronchitis (HCC)    Coronary artery calcification seen on CAT scan    Cyst of pancreas    1 cm (7/18), repeat ct in 12 months.     Hyperlipidemia  Hypertension    Obstructive sleep apnea    Renal disorder    Rhinitis    Sleep apnea    pt denies sleep apnea-lost weight   Past Surgical History:  Procedure Laterality Date   COLONOSCOPY  09/29/2014   INCISION AND DRAINAGE OF WOUND Left 03/13/2024   Procedure: IRRIGATION AND DEBRIDEMENT WOUND;  Surgeon: Bradly Bienenstock, MD;  Location: [facility] OR;  Service: Orthopedics;  Laterality: Left;  IRRIGATION AND DEBRIDEMENT FIFTH FINGER LEFT   LAPAROSCOPIC CHOLECYSTECTOMY SINGLE SITE WITH INTRAOPERATIVE CHOLANGIOGRAM N/A 09/15/2019   Procedure: LAPAROSCOPIC CHOLECYSTECTOMY SINGLE SITE;  Surgeon: Karie Soda, MD;  Location: [facility] ORS;  Service: General;  Laterality: N/A;   LITHOTRIPSY  2000   for kidney stone   Current Outpatient Medications on File Prior to Visit  Medication Sig Dispense Refill   acetaminophen (TYLENOL) 500 MG tablet Take 1,000  mg by mouth every 6 (six) hours as needed.     apixaban (ELIQUIS) 5 MG TABS tablet TAKE 1 TABLET BY MOUTH TWICE A DAY 60 tablet 5   Ascorbic Acid (VITAMIN C) 1000 MG tablet Take 1,000 mg by mouth 2 (two) times daily.     budesonide-formoterol (SYMBICORT) 160-4.5 MCG/ACT inhaler Inhale 2 puffs into the lungs in the morning and at bedtime. Generic medication 1 each 6   Cholecalciferol (VITAMIN D3) 125 MCG (5000 UT) TABS Take by mouth.     ciprofloxacin (CIPRO) 500 MG tablet Take 500 mg by mouth 2 (two) times daily.     Coenzyme Q10 (CO Q-10) 100 MG CAPS Take 1 capsule by mouth daily.     FIBER COMPLETE PO Take 1 tablet by mouth daily.     furosemide (LASIX) 40 MG tablet TAKE 1 TABLET (40 MG TOTAL) BY MOUTH DAILY. STOP HCTZ 30 tablet 3   levalbuterol (XOPENEX) 0.63 MG/3ML nebulizer solution Take 3 mLs (0.63 mg total) by nebulization every 4 (four) hours as needed for wheezing or shortness of breath. 360 mL 2   metoprolol succinate (TOPROL-XL) 100 MG 24 hr tablet Take 2 tablets (200 mg total) by mouth daily. Take with or immediately following a meal. 180 tablet 3   montelukast (SINGULAIR) 10 MG tablet Take 1 tablet (10 mg total) by mouth daily. 90 tablet 2   pantoprazole (PROTONIX) 40 MG tablet Take 1 tablet (40 mg total) by mouth 2 (two) times daily. 180 tablet 2   Respiratory Therapy Supplies (FLUTTER) DEVI Use as directed 1 each 0   rosuvastatin (CRESTOR) 20 MG tablet TAKE 1 TABLET BY MOUTH EVERY DAY **D/C LIPITOR** 90 tablet 2   No current facility-administered medications on file prior to visit.   Allergies  Allergen Reactions   Dust Mite Extract Other (See Comments)   Social History   Socioeconomic History   Marital status: Married    Spouse name: Not on file   Number of children: Not on file   Years of education: Not on file   Highest education level: Bachelor's degree (e.g., BA, AB, BS)  Occupational History   Not on file  Tobacco Use   Smoking status: Former    Current  packs/day: 0.00    Average packs/day: 2.0 packs/day for 11.0 years (22.0 ttl pk-yrs)    Types: Cigarettes    Start date: 12/20/1963    Quit date: 12/19/1974    Years since quitting: 49.2   Smokeless tobacco: Never  Vaping Use   Vaping status: Never Used  Substance and Sexual Activity   Alcohol use: Not Currently  Alcohol/week: 1.0 standard drink of alcohol    Types: 1 Glasses of wine per week   Drug use: No   Sexual activity: Not on file  Other Topics Concern   Not on file  Social History Narrative   Not on file   Social Drivers of Health   Financial Resource Strain: Low Risk  (02/14/2024)   Overall Financial Resource Strain (CARDIA)    Difficulty of Paying Living Expenses: Not very hard  Food Insecurity: No Food Insecurity (02/15/2024)   Hunger Vital Sign    Worried About Running Out of Food in the Last Year: Never true    Ran Out of Food in the Last Year: Never true  Transportation Needs: No Transportation Needs (02/15/2024)   PRAPARE - Administrator, Civil Service (Medical): No    Lack of Transportation (Non-Medical): No  Physical Activity: Insufficiently Active (02/14/2024)   Exercise Vital Sign    Days of Exercise per Week: 1 day    Minutes of Exercise per Session: 20 min  Stress: No Stress Concern Present (02/14/2024)   [employer] of Occupational Health - Occupational Stress Questionnaire    Feeling of Stress : Not at all  Social Connections: Socially Integrated (02/15/2024)   Social Connection and Isolation Panel [NHANES]    Frequency of Communication with Friends and Family: More than three times a week    Frequency of Social Gatherings with Friends and Family: More than three times a week    Attends Religious Services: More than 4 times per year    Active Member of [employer] or Organizations: Yes    Attends Banker Meetings: More than 4 times per year    Marital Status: Married  Catering manager Violence: Not At Risk (02/15/2024)    Humiliation, Afraid, Rape, and Kick questionnaire    Fear of Current or Ex-Partner: No    Emotionally Abused: No    Physically Abused: No    Sexually Abused: No      Review of Systems  Respiratory:  Positive for cough.   All other systems reviewed and are negative.      Objective:   Physical Exam Vitals reviewed.  Constitutional:      Appearance: He is well-developed.  HENT:     Head: Normocephalic and atraumatic.     Nose: Nose normal.     Mouth/Throat:     Pharynx: No oropharyngeal exudate.  Neck:     Vascular: No carotid bruit.  Cardiovascular:     Rate and Rhythm: Tachycardia present. Rhythm irregular.     Heart sounds: Normal heart sounds.  Pulmonary:     Effort: Pulmonary effort is normal. No tachypnea or respiratory distress.     Breath sounds: No stridor or decreased air movement. Wheezing, rhonchi and rales present.    Musculoskeletal:     Right lower leg: Edema present.     Left lower leg: Edema present.           Assessment & Plan:  COPD (chronic obstructive pulmonary disease) with chronic bronchitis (HCC)  Shortness of breath  Persistent asthma with acute exacerbation, unspecified asthma severity Patient has been diagnosed with COPD.  He has a lifetime history of asthma.  High-resolution CT scan in December revealed: IMPRESSION: 1. Diffuse bilateral bronchial wall thickening with interval increase in scarring and volume loss of the left lung base, similar scarring of the right lung base. Bronchiolar plugging throughout the left lung base as well as small irregular infectious or  inflammatory nodules in the dependent left lower lobe. Findings are most consistent with aspiration, likely ongoing. 2. Mild lobular air trapping on expiratory phase imaging, consistent with small airways disease. 3. Moderate hiatal hernia with intrathoracic position of the gastric fundus which may place the patient at risk for aspiration. 4. Coronary artery  disease. Patient is currently on maximal therapy for COPD including Symbicort and Spiriva.  However he is having frequent attacks requiring antibiotics and steroids.  He seems to respond to steroids quickly.  Therefore I feel that this may be more asthma related.  Therefore I am concerned that he is developing oral corticosteroid dependent asthma.  After he completes the most recent prednisone taper pack I want him to continue 10 mg daily.  On the consult pulmonology to see if he would benefit from possible immunotherapy such as Dupixent or Xolair or Nucala to help prevent the frequency and severity of the asthma attacks.  No CBC has ever shown hypereosinophilia.  However I do believe allergies are playing a role in his most recent trigger

## 2024-03-22 NOTE — Telephone Encounter (Signed)
 Requested medication (s) are due for refill today: Yes  Requested medication (s) are on the active medication list: No  Last refill:  12/21/23  Future visit scheduled: No  Notes to clinic:  Medication was discontinued 12/22/23     Requested Prescriptions  Pending Prescriptions Disp Refills   WIXELA INHUB 250-50 MCG/ACT AEPB [Pharmacy Med Name: WIXELA 250-50 INHUB] 60 each 1    Sig: INHALE 1 PUFF INTO THE LUNGS IN THE MORNING AND AT BEDTIME.     Pulmonology:  Combination Products Passed - 03/22/2024  3:48 PM      Passed - Valid encounter within last 12 months    Recent Outpatient Visits           Today COPD (chronic obstructive pulmonary disease) with chronic bronchitis (HCC)   [place] [facility] Donita Brooks, MD   3 weeks ago COPD (chronic obstructive pulmonary disease) with chronic bronchitis (HCC)   [place] [facility] Donita Brooks, MD   1 month ago COPD (chronic obstructive pulmonary disease) with chronic bronchitis (HCC)   [place] [facility] Donita Brooks, MD   3 months ago Decreased cardiac ejection fraction   [place] [facility] Donita Brooks, MD   4 months ago Decreased cardiac ejection fraction   [place] [facility] Pickard, Priscille Heidelberg, MD       Future Appointments             In 3 weeks Corrin Parker, PA-C [facility]   In 2 months Denyce Robert, NP [facility]

## 2024-03-25 DIAGNOSIS — M79645 Pain in left finger(s): Secondary | ICD-10-CM | POA: Diagnosis not present

## 2024-03-27 DIAGNOSIS — H31091 Other chorioretinal scars, right eye: Secondary | ICD-10-CM | POA: Diagnosis not present

## 2024-03-27 DIAGNOSIS — H33021 Retinal detachment with multiple breaks, right eye: Secondary | ICD-10-CM | POA: Diagnosis not present

## 2024-03-27 DIAGNOSIS — T85398D Other mechanical complication of other ocular prosthetic devices, implants and grafts, subsequent encounter: Secondary | ICD-10-CM | POA: Diagnosis not present

## 2024-03-27 DIAGNOSIS — M79645 Pain in left finger(s): Secondary | ICD-10-CM | POA: Diagnosis not present

## 2024-03-27 DIAGNOSIS — Z9889 Other specified postprocedural states: Secondary | ICD-10-CM | POA: Diagnosis not present

## 2024-03-29 DIAGNOSIS — M79645 Pain in left finger(s): Secondary | ICD-10-CM | POA: Diagnosis not present

## 2024-04-01 DIAGNOSIS — M79645 Pain in left finger(s): Secondary | ICD-10-CM | POA: Diagnosis not present

## 2024-04-03 DIAGNOSIS — M79645 Pain in left finger(s): Secondary | ICD-10-CM | POA: Diagnosis not present

## 2024-04-04 NOTE — Progress Notes (Signed)
 Cardiology Office Note:    Date:  04/15/2024   ID:  KANARI STVIL, DOB 12-12-1946, MRN [id number]  PCP:  Austine Lefort, MD  Cardiologist:  Lauro Portal, MD {  Referring MD: Austine Lefort, MD   Chief Complaint: follow-up of atrial fibrillation and dyspnea on exertion   History of Present Illness:    Mario [name] is a 78 y.o. male with a history of three vessel coronary artery calcifications noted on prior chest CTs dating back to at least 2019 but low risk Myoview  in 04/2019, persistent atrial fibrillation on Eliquis , hypertension, hyperlipidemia, obstructive sleep apnea on CPAP, bronchiectasis, and obesity who is followed by Dr. Katheryne Pane and presents today for routine follow-up.   Patient was referred to Dr. Katheryne Pane in 12/2017 for further evaluation of paroxysmal atrial fibrillation. He reported intermittent palpitations for the past 2 years at that time. Prior Echo in 07/2015 showed normal LV function. Event Monitor was ordered and showed  normal sinus rhythm with paroxysmal atrial fibrillation. Myoview  in 04/2019, which was ordered for further evaluation of coronary calcifications noted on prior CT, was low risk and showed a very subtle reversible defect in the apex but this was felt to likely represent variation in diaphragmatic attenuation. He is now felt to have persistent atrial fibrillation.   He was recently admitted from 02/15/2024 to 02/18/2024 for atrial fibrillation with RVR after presenting with progressive dyspnea. He was initially started on a Cardizem  drip but became mildly hypotensive this so it was stopped and home Metoprolol  was increased. Albuterol  was stopped due to concern that this was causing elevated hear rates and he was discharged on Xopenex  instead.   He was most recently seen by Palmer Bobo, NP, on 03/12/2024 at which time he reported persistent weakness and ongoing shortness of breath. Cardiac PET stress test was ordered to rule out dyspnea as an anginal  equivalent. This is scheduled fo 06/11/2024.   Patient presents today for follow-up. Here with daughter.  Patient states he is not doing well.  He describes significant weakness  and dyspnea on exertion since being discharged from the hospital.  He states he can barely walk from the living room to the kitchen in his house without having to stop.  He was not like this prior to his admission 2 months ago and daughter is wondering whether this is due to the significant increase in his Metoprolol  from 50mg  to 200mg  daily.  He has been sleeping on an incline since last hospitalization there is - this is partly due to back pain but it also sounds like he has some degree of orthopnea.  No PND.  He has chronic lower extremity edema but states this has been stable.  He reports a 30 pound weight loss about last 2 months.  He denies any chest pain, palpitations, lightheadedness, dizziness, syncope.  He has also been struggling with recurrent COPD/asthma exacerbations and is currently on antibiotics and steroids.   EKG at the end of visit showed atrial fibrillation with rates in the 150s.  Will send to the ED.  EKGs/Labs/Other Studies Reviewed:    The following studies were reviewed today:  Monitor 01/08/2018 to 02/06/2018: NSR with PAF. _______________  Myoview  05/15/2019: Nuclear stress EF: 54%. The left ventricular ejection fraction is mildly decreased (45-54%). There was no ST segment deviation noted during stress. There is a very subtle defect of mild severity present in the apex location. The defect is reversible but likely represents variations in diaphragmatic attenuation.  This is a low risk study. _______________  Echocardiogram 10/31/2023: Impressions: 1. Left ventricular ejection fraction, by estimation, is 50 to 55%. The  left ventricle has low normal function. The left ventricle has no regional  wall motion abnormalities. There is mild left ventricular hypertrophy.  Left ventricular  diastolic  parameters are indeterminate.   2. Right ventricular systolic function is normal. The right ventricular  size is normal. There is normal pulmonary artery systolic pressure. The  estimated right ventricular systolic pressure is 16.5 mmHg.   3. Left atrial size was mildly dilated.   4. The mitral valve is normal in structure. No evidence of mitral valve  regurgitation.   5. The aortic valve is tricuspid. Aortic valve regurgitation is not  visualized.   6. The inferior vena cava is normal in size with greater than 50%  respiratory variability, suggesting right atrial pressure of 3 mmHg.   EKG:  EKG ordered today.   EKG Interpretation Date/Time:  Monday April 15 2024 16:35:07 EDT Ventricular Rate:  154 PR Interval:    QRS Duration:  74 QT Interval:  148 QTC Calculation: 237 R Axis:   -30  Text Interpretation: Atrial fibrillation with RVR Left axis deviation Nonspecific ST and T wave abnormality Confirmed by Sharren Decree [phone]) on 04/15/2024 5:03:30 PM    Recent Labs: 09/11/2023: ALT 21 02/15/2024: B Natriuretic Peptide 230.0 02/16/2024: Hemoglobin 10.6; Platelets 325; TSH 1.691 02/17/2024: Magnesium 2.2 03/12/2024: BUN 29; Creatinine, Ser 1.00; Potassium 4.8; Sodium 141  Recent Lipid Panel    Component Value Date/Time   CHOL 133 03/26/2021 0855   TRIG 78 03/26/2021 0855   HDL 60 03/26/2021 0855   CHOLHDL 2.2 03/26/2021 0855   VLDL 15 02/18/2016 0851   LDLCALC 57 03/26/2021 0855    Physical Exam:    Vital Signs: BP 129/79 (BP Location: Left Arm, Patient Position: Sitting, Cuff Size: Normal)   Pulse 76   Ht 5\' 9"  (1.753 m)   Wt 216 lb 9.6 oz (98.2 kg)   SpO2 95%   BMI 31.99 kg/m     Wt Readings from Last 3 Encounters:  04/15/24 216 lb 9.6 oz (98.2 kg)  04/12/24 217 lb 6.4 oz (98.6 kg)  03/22/24 217 lb 3.2 oz (98.5 kg)     General: 78 y.o. Caucasian male in no acute distress. HEENT: Normocephalic and atraumatic. Sclera clear.  Heart: Tachycardic with  irregularly irregular rhythm. Distant heart sounds. No murmurs, gallops, or rubs.  Lungs: No increased work of breathing. Mild crackles noted in bilateral bases. No wheezes or rhonchi.  Extremities: 1+ pitting edema of bilateral lower extremities. Skin: Warm and dry. Neuro: No focal deficits. Psych: Normal affect. Responds appropriately.   Assessment:    1. Persistent atrial fibrillation (HCC)   2. Dyspnea on exertion   3. Coronary artery calcification   4. Primary hypertension   5. Hyperlipidemia, unspecified hyperlipidemia type   6. Obstructive sleep apnea     Plan:    Atrial Fibrillation with RVR Patient has history of atrial fibrillation which she has been felt to be persistent.  Last documented episode of sinus rhythm was in 12/2022.  He was recently admitted at the end of 01/2024 for rapid atrial fibrillation and home metoprolol  was significantly increased.  Of note, Cardiology was not consulted during that admission.  He presents to the office today with reports of significant weakness and dyspnea on exertion and was found to be in rapid atrial fibrillation with rates in the 150s.  Will  send to the ED. Patient has a history of COPD/ asthma and bronchiectasis and has had recurrent issues for COPD/ asthma exacerbation. He is currently on antibiotics and steroids. Therefore, recommend Internal Medicine to admit but Cardiology will follow along. Will update inpatient team. Discussed plan with DOD (Dr. Katheryne Pane) who agreed.    In the ED, recommend: - Starting IV Cardizem  drip. EF was previously in 50-55%. I am concerned that this may have dropped since then so will need repeat Echo. If EF is now down, will ultimately need to stop Cardizem . - Consider stopping Toprol -XL and switching to Bisoprolol. Patient has COPD/asthma and bronchiectasis and has been feeling poorly after Toprol -XL was significantly increased during last admission. Discuss this with Pharmacy in the office who agreed wit this.   - Continue Eliquis  5mg  twice daily.  - RVR may be secondary to COPD exacerbation with steroid use and increased use of bronchodilators. Although we typically do not rush to cardiovert someone with a COPD exacerbation, he may end of need TEE/ DCCV this admission. He is not sure if he has missed a dose of Eliquis  in the last 3 weeks.   Possible Acute Diastolic CHF Patient reports significant dyspnea with minimal exertion. Concerned for acute CHF in setting of rapid atrial fibrillation.  - He does have mild crackles in bilateral bases and 1+ edema on exam. However, he reports his weight is down 30lbs since last admission 2 months. Chest x-ray last week showed minimal bibasilar subsegmental atelectasis or scarring with minimal left pleural effusion.  - He is on PO Lasix  40mg  at home but does not take this every day as prescribed.   In the ED, recommend.  - Repeating chest x-ray. - Checking BNP, BMET, CBC.  - Updating Echo. - Consider giving a dose of IV Lasix  40mg  daily depending on BNP and renal function.   Dyspnea on Exertion Patient has significant dyspnea on exertion. Echo in 10/2023 showed LVEF of 50-55% with normal wall motion. - Patient continues to have significant dyspnea on exertion.  - He does have a history of bronchiectasis which could be the etiology of his dyspnea. This is followed by Pulmonology. However, this could also be secondary to atrial fibrillation and suspect CHF. Cardiac PET stress test was ordered at last visit to rule out dyspnea as an anginal equivalent. This is scheduled for 06/12/2023.   Coronary Artery Calcifications 3-vessel coronary artery calcifications have been noted on prior chest CTs dating back to at least 2019. Myoview  in 04/2019 was low risk.  - No chest pain.  - No aspirin  given need for full anticoagulation.  - Continue statin.  Hypertension BP well controlled.   - Continue rate control agents for atrial fibrillation as above.  Hyperlipidemia -  Continue Crestor  20mg  daily.   Obstructive Sleep Apnea - Continue CPAP.  Disposition: Will send patient to the ED.   Sable Cram, PA-C  04/15/2024 5:37 PM    [facility]

## 2024-04-05 ENCOUNTER — Telehealth: Payer: Self-pay

## 2024-04-05 DIAGNOSIS — M79645 Pain in left finger(s): Secondary | ICD-10-CM | POA: Diagnosis not present

## 2024-04-09 DIAGNOSIS — M79645 Pain in left finger(s): Secondary | ICD-10-CM | POA: Diagnosis not present

## 2024-04-11 DIAGNOSIS — M79645 Pain in left finger(s): Secondary | ICD-10-CM | POA: Diagnosis not present

## 2024-04-12 ENCOUNTER — Telehealth: Payer: Self-pay | Admitting: Nurse Practitioner

## 2024-04-12 ENCOUNTER — Ambulatory Visit: Admitting: Nurse Practitioner

## 2024-04-12 ENCOUNTER — Ambulatory Visit
Admission: RE | Admit: 2024-04-12 | Discharge: 2024-04-12 | Disposition: A | Discharge status: Home / Self Care | Source: Ambulatory Visit | Attending: Nurse Practitioner | Admitting: Nurse Practitioner

## 2024-04-12 ENCOUNTER — Encounter: Payer: Self-pay | Admitting: Nurse Practitioner

## 2024-04-12 VITALS — BP 126/60 | HR 68 | Ht 69.0 in | Wt 217.4 lb

## 2024-04-12 DIAGNOSIS — J320 Chronic maxillary sinusitis: Secondary | ICD-10-CM

## 2024-04-12 DIAGNOSIS — J441 Chronic obstructive pulmonary disease with (acute) exacerbation: Secondary | ICD-10-CM

## 2024-04-12 DIAGNOSIS — J4489 Other specified chronic obstructive pulmonary disease: Secondary | ICD-10-CM | POA: Diagnosis not present

## 2024-04-12 DIAGNOSIS — Z87891 Personal history of nicotine dependence: Secondary | ICD-10-CM | POA: Diagnosis not present

## 2024-04-12 DIAGNOSIS — Z01811 Encounter for preprocedural respiratory examination: Secondary | ICD-10-CM

## 2024-04-12 DIAGNOSIS — J479 Bronchiectasis, uncomplicated: Secondary | ICD-10-CM | POA: Diagnosis not present

## 2024-04-12 DIAGNOSIS — J4540 Moderate persistent asthma, uncomplicated: Secondary | ICD-10-CM

## 2024-04-12 DIAGNOSIS — J90 Pleural effusion, not elsewhere classified: Secondary | ICD-10-CM | POA: Diagnosis not present

## 2024-04-12 DIAGNOSIS — R058 Other specified cough: Secondary | ICD-10-CM | POA: Diagnosis not present

## 2024-04-12 DIAGNOSIS — R059 Cough, unspecified: Secondary | ICD-10-CM | POA: Diagnosis not present

## 2024-04-12 LAB — NITRIC OXIDE: Nitric Oxide: 26

## 2024-04-12 MED ORDER — PREDNISONE 10 MG PO TABS
ORAL_TABLET | ORAL | 0 refills | Status: DC
Start: 2024-04-12 — End: 2024-04-18

## 2024-04-12 MED ORDER — METHYLPREDNISOLONE ACETATE 80 MG/ML IJ SUSP
80.0000 mg | Freq: Once | INTRAMUSCULAR | Status: AC
  Administered 2024-04-12: 80 mg via INTRAMUSCULAR

## 2024-04-12 MED ORDER — BENZONATATE 200 MG PO CAPS: 200.0000 mg | ORAL_CAPSULE | Freq: Three times a day (TID) | ORAL | 1 refills | Status: DC | PRN

## 2024-04-12 NOTE — Assessment & Plan Note (Signed)
 Advised we do not provide clearance for surgery, as this should be decided upon between pt, surgeon, and anesthesia. We will complete surgical risk assessment. Advised him that if he is not improved come surgery time, he would need to postpone as he would be high risk to undergo surgery during acute exacerbation. He verbalized understanding.  If he is improved and DOE baseline, he can discuss with surgeon to decide if it is appropriate to complete his surgery while on steroids as he will still be on them come 5/1.    Moderate to high risk, pending status and type of anesthesia. Factors that increase the risk for postoperative pulmonary complications are COPD/asthma, bronchiectasis, age, obesity.  Respiratory complications generally occur in 1% of ASA Class I patients, 5% of ASA Class II and 10% of ASA Class III-IV patients These complications rarely result in mortality and include postoperative pneumonia, atelectasis, pulmonary embolism, ARDS and increased time requiring postoperative mechanical ventilation.   Overall, I recommend proceeding with the surgery if the risk for respiratory complications are outweighed by the potential benefits and if patient is no longer in acute exacerbation/breathing is baseline. This will need to be discussed between the patient and surgeon.   To reduce risks of respiratory complications, I recommend: --Pre- and post-operative incentive spirometry performed frequently while awake --Inpatient use of currently prescribed bronchodilators  --Short duration of surgery as much as possible and avoid paralytic if possible. --OOB, encourage mobility post-op

## 2024-04-12 NOTE — Assessment & Plan Note (Signed)
 Reduced sputum production and non-purulent. Would not recommend further antimicrobial therapy, pending CXR. See above plan. If symptoms fail to improve or worsen, may need to obtain further imaging and sputum cultures.

## 2024-04-12 NOTE — Assessment & Plan Note (Signed)
 No acute worsening per his report. Follow up with ENT as scheduled. Will obtain IgE at follow up - consider allergy  referral.

## 2024-04-12 NOTE — Patient Instructions (Signed)
 Continue Symbicort  2 puffs Twice daily. Brush tongue and rinse mouth afterwards Continue Albuterol  inhaler 2 puffs or levalbuterol  3 mL neb every 6 hours as needed for shortness of breath or wheezing. Notify if symptoms persist despite rescue inhaler/neb use.  Continue montelukast  1 tab daily Continue pantoprazole  Twice daily for reflux Continue mucinex  DM Twice daily for cough/congestion  Start daily non drowsy allergy  pill such as claritin, zyrtec or xyzal.  Prednisone  taper. 4 tabs for 3 days, then 3 tabs for 3 days, 2 tabs for 3 days, then 1 tab for 3 days, then stop. Take in WM with food. Start tomorrow  Benzonatate 1 capsule Three times a day as needed for cough  Steroid shot today Chest x ray today  We will obtain some lab work when you come back and decide if we need to add any additional therapies, pending how you are feeling  If you are feeling better and breathing is improved, you can move forward with your retina surgery next week since you would be awake and not under anesthesia. If you're still having difficulties, I would recommend postponing. You will also need to notify the surgeon you're on steroids   Follow up in 4-6 weeks with Katie Benny Deutschman,NP. Schedule appt with Dr. Diania Fortes in 3 months to establish care in [place]. If symptoms do not improve or worsen, please contact office for sooner follow up or seek emergency care.

## 2024-04-12 NOTE — Progress Notes (Signed)
 @Patient  ID: Mario [name], male    DOB: 1946-09-24, 78 y.o.   MRN: [id number]  Chief Complaint  Patient presents with   Follow-up    Discuss clearance for surgery , having sob  when walking  , this has been going on for last couple month has follow up pcp and cardiology     Referring provider: Austine Lefort, MD  HPI: 78 year old male, former smoker followed for COPD/chronic asthma and bronchiectasis.  He is a patient of Dr. Jacqui Mau and last via virtual visit 02/07/2024. Past medical history significant for A-fib RVR on Eliquis , hypertension, CAD, chronic sinusitis, GERD, HLD.  TEST/EVENTS:  08/13/2018 PFT: FVC 73, FEV1 63, ratio 62, TLC 88, DLCO 74.  Moderate obstruction with moderate diffusing defect 08/2020 sputum culture positive for pansensitive pseudomonas  03/24/2023 sputum culture positive for serratia liquefaciens resistant to augmentin  and cefazolin  08/14/2023 CT sinus: Unchanged moderate mucosal disease in right maxillary sinus.  Interval resolution of left maxillary sinus disease.  Unchanged occlusion of the right ostiomeatal unit by mucosal disease 10/31/2023 echo: EF 50 to 55%.  RV size and function is normal.  Normal PASP.  LA mildly dilated. 12/05/2023 HRCT chest: Atherosclerosis.  Moderate hiatal hernia.  Diffuse bilateral bronchial wall thickening.  Increase in scarring and volume loss of left lung base.  Similar scarring of right lung base.  Bronchial plugging throughout the left lung base as well as small irregular infectious or inflammatory nodules in the left lower lobe.  Small pulmonary nodules, unchanged and definitively benign.  Mild lobular air trapping.  02/07/2024: Virtual visit with Dr. Waymond Hailey.  Maintained on Symbicort .  Finishing Cipro  and reports that mucus is much lighter but still with severe cough.  Advised to reduce Symbicort  to 1 puff twice daily.  Increase nebulizer up to every 4 hours as needed.  Advised to use Mucinex  DM for cough and  congestion.  04/12/2024: Today - surgical risk assessment Discussed the use of AI scribe software for clinical note transcription with the patient, who gave verbal consent to proceed.  History of Present Illness   Mario ASTARITA "Athena Bland" is a 78 year old male with bronchiectasis and COPD/chronic asthma who presents for surgical risk assessment prior to retina surgery.  However, he is having acute symptoms with worsening shortness of breath and cough.  He has experienced worsening shortness of breath and cough since his finger surgery on March 13, 2024. He reports significant weakness and dyspnea, requiring rest after walking short distances. He uses his rescue inhaler daily and sleeps in a recliner due to wheezing.  The rescue inhaler does seem to help some.  He has a significant cough, which is increased in frequency.  Has some trouble getting phlegm up in the morning and tends to use his rescue inhaler, which does help open him up.  Sputum is clear, which has improved since he completed courses of doxycycline  and Cipro .  He is currently on prednisone  10 mg daily, prescribed by his primary care doctor.  Feels like this is helping slightly.  Has not had a recent chest x-ray.  He has a history of bronchiectasis.  He is a former smoker with moderate obstruction on prior PFT.  Does carry diagnosis of asthma as well.  Uses Symbicort  twice daily. He discontinued Spiriva  due to lack of improvement. He takes montelukast  and Mucinex  DM daily. Not currently taking a daily allergy  pill. Does not feel like his sinuses are troublesome right now. He reports leg swelling managed  with Lasix . No recent increase. No palpitations or CP.   No fever, chills, or hemoptysis. Oxygen levels have been stable, and he has not experienced hypoxemia. He has a history of allergy  testing but does not currently see an allergist.   His doctor mentioned a shot to help with his breathing problems. He is wondering if this would be an  option for him. Highest peripheral eosinophil count documented 304 in April 2024.   His upcoming surgery is not under anesthesia. To his knowledge, he is supposed to be awake. It is currently scheduled for 5/1.   FeNO 26 ppb      Allergies  Allergen Reactions   Dust Mite Extract Other (See Comments)    Immunization History  Administered Date(s) Administered   Fluad Quad(high Dose 65+) 09/09/2019   Influenza Split 09/09/2013   Influenza, High Dose Seasonal PF 09/22/2015, 09/27/2016, 09/27/2016, 09/11/2017   Influenza,inj,Quad PF,6+ Mos 09/03/2018   Influenza-Unspecified 09/18/2014   PFIZER(Purple Top)SARS-COV-2 Vaccination 02/05/2020, 02/26/2020   Pneumococcal Conjugate-13 02/17/2014   Pneumococcal Polysaccharide-23 10/13/2009, 04/16/2015   Tdap 03/13/2007, 03/10/2024   Zoster, Live 05/29/2015    Past Medical History:  Diagnosis Date   Asthma    Atrial fibrillation (HCC)    Biliary colic 2014   Bronchiectasis (HCC)    2021   COPD (chronic obstructive pulmonary disease) with chronic bronchitis (HCC)    Coronary artery calcification seen on CAT scan    Cyst of pancreas    1 cm (7/18), repeat ct in 12 months.     Hyperlipidemia    Hypertension    Obstructive sleep apnea    Renal disorder    Rhinitis    Sleep apnea    pt denies sleep apnea-lost weight    Tobacco History: Social History   Tobacco Use  Smoking Status Former   Current packs/day: 0.00   Average packs/day: 2.0 packs/day for 11.0 years (22.0 ttl pk-yrs)   Types: Cigarettes   Start date: 12/20/1963   Quit date: 12/19/1974   Years since quitting: 49.3  Smokeless Tobacco Never   Counseling given: Not Answered   Outpatient Medications Prior to Visit  Medication Sig Dispense Refill   acetaminophen  (TYLENOL ) 500 MG tablet Take 1,000 mg by mouth every 6 (six) hours as needed.     apixaban  (ELIQUIS ) 5 MG TABS tablet TAKE 1 TABLET BY MOUTH TWICE A DAY 60 tablet 5   Ascorbic Acid (VITAMIN C) 1000 MG tablet  Take 1,000 mg by mouth 2 (two) times daily.     budesonide -formoterol  (SYMBICORT ) 160-4.5 MCG/ACT inhaler Inhale 2 puffs into the lungs in the morning and at bedtime. Generic medication 1 each 6   Cholecalciferol (VITAMIN D3) 125 MCG (5000 UT) TABS Take by mouth.     Coenzyme Q10 (CO Q-10) 100 MG CAPS Take 1 capsule by mouth daily.     FIBER COMPLETE PO Take 1 tablet by mouth daily.     furosemide  (LASIX ) 40 MG tablet TAKE 1 TABLET (40 MG TOTAL) BY MOUTH DAILY. STOP HCTZ 30 tablet 3   levalbuterol  (XOPENEX ) 0.63 MG/3ML nebulizer solution Take 3 mLs (0.63 mg total) by nebulization every 4 (four) hours as needed for wheezing or shortness of breath. 360 mL 2   metoprolol  succinate (TOPROL -XL) 100 MG 24 hr tablet Take 2 tablets (200 mg total) by mouth daily. Take with or immediately following a meal. 180 tablet 3   montelukast  (SINGULAIR ) 10 MG tablet Take 1 tablet (10 mg total) by mouth daily. 90 tablet  2   pantoprazole  (PROTONIX ) 40 MG tablet Take 1 tablet (40 mg total) by mouth 2 (two) times daily. 180 tablet 2   Respiratory Therapy Supplies (FLUTTER) DEVI Use as directed 1 each 0   rosuvastatin  (CRESTOR ) 20 MG tablet TAKE 1 TABLET BY MOUTH EVERY DAY **D/C LIPITOR** 90 tablet 2   predniSONE  (DELTASONE ) 10 MG tablet Take 1 tablet (10 mg total) by mouth daily with breakfast. 30 tablet 1   ciprofloxacin  (CIPRO ) 500 MG tablet Take 500 mg by mouth 2 (two) times daily.     doxycycline  (VIBRA -TABS) 100 MG tablet Take 1 tablet (100 mg total) by mouth 2 (two) times daily. 28 tablet 0   No facility-administered medications prior to visit.     Review of Systems:   Constitutional: No weight loss or gain, night sweats, fevers, chills +fatigue, lassitude. HEENT: No headaches, difficulty swallowing, tooth/dental problems, or sore throat. No sneezing, itching, ear ache +chronic nasal congestion CV: +baseline swelling in lower extremities. No chest pain, orthopnea, PND, anasarca, dizziness, palpitations,  syncope Resp: + shortness of breath with exertion; cough with reduced sputum production; wheezing. No excess mucus or change in color of mucus. No hemoptysis.No chest wall deformity GI:  No heartburn, indigestion, abdominal pain, nausea, vomiting, diarrhea, change in bowel habits, loss of appetite, bloody stools.  GU: No dysuria, change in color of urine, urgency or frequency.  No flank pain, no hematuria  Skin: No rash, lesions, ulcerations MSK:  No joint pain or swelling.   Neuro: No dizziness or lightheadedness.  Psych: No depression or anxiety. Mood stable.     Physical Exam:  BP 126/60   Pulse 68   Ht 5\' 9"  (1.753 m)   Wt 217 lb 6.4 oz (98.6 kg)   SpO2 95%   BMI 32.10 kg/m   GEN: Pleasant, interactive, well-kempt; obese; non-toxic and in no acute distress HEENT:  Normocephalic and atraumatic. PERRLA. Sclera white. Nasal turbinates pale, moist and patent bilaterally. No rhinorrhea present. Oropharynx pink and moist, without exudate or edema. No lesions, ulcerations, or postnasal drip.  NECK:  Supple w/ fair ROM. No JVD present. Normal carotid impulses w/o bruits. Thyroid  symmetrical with no goiter or nodules palpated. No lymphadenopathy.   CV: Irregular rhythm, rate controlled, no m/r/g, no peripheral edema. Pulses intact, +2 bilaterally. No cyanosis, pallor or clubbing. PULMONARY:  Unlabored, regular breathing. Scattered rhonchi bilaterally A&P. Bronchitic cough. No accessory muscle use.  GI: BS present and normoactive. Soft, non-tender to palpation. No organomegaly or masses detected.  MSK: No erythema, warmth or tenderness. Cap refil <2 sec all extrem. No deformities or joint swelling noted.  Neuro: A/Ox3. No focal deficits noted.   Skin: Warm, no lesions or rashe Psych: Normal affect and behavior. Judgement and thought content appropriate.     Lab Results:  CBC    Component Value Date/Time   WBC 10.5 02/16/2024 0350   RBC 3.38 (L) 02/16/2024 0350   HGB 10.6 (L)  02/16/2024 0350   HCT 33.9 (L) 02/16/2024 0350   PLT 325 02/16/2024 0350   MCV 100.3 (H) 02/16/2024 0350   MCH 31.4 02/16/2024 0350   MCHC 31.3 02/16/2024 0350   RDW 15.8 (H) 02/16/2024 0350   LYMPHSABS 2,130 09/11/2023 1622   MONOABS 1.3 (H) 09/16/2019 0341   EOSABS 56 09/11/2023 1622   BASOSABS 47 09/11/2023 1622    BMET    Component Value Date/Time   NA 141 03/12/2024 1239   K 4.8 03/12/2024 1239   CL 104 03/12/2024  1239   CO2 21 03/12/2024 1239   GLUCOSE 83 03/12/2024 1239   GLUCOSE 94 02/17/2024 0429   BUN 29 (H) 03/12/2024 1239   CREATININE 1.00 03/12/2024 1239   CREATININE 1.27 11/24/2023 1622   CALCIUM  9.6 03/12/2024 1239   GFRNONAA >60 02/17/2024 0429   GFRNONAA 72 03/26/2021 0855   GFRAA 83 03/26/2021 0855    BNP    Component Value Date/Time   BNP 230.0 (H) 02/15/2024 1354   BNP 107 (H) 11/24/2023 1622     Imaging:  DG Chest 2 View Result Date: 04/12/2024 CLINICAL DATA:  Cough for several months. EXAM: CHEST - 2 VIEW COMPARISON:  February 15, 2024. FINDINGS: Stable cardiomediastinal silhouette. Minimal bibasilar subsegmental atelectasis or scarring is noted. Minimal left pleural effusion may be present. Severe compression deformity of lower thoracic vertebral body is noted concerning for fracture of indeterminate age. IMPRESSION: Minimal bibasilar subsegmental atelectasis or scarring, with minimal left pleural effusion. Severe compression deformity of lower thoracic vertebral body concerning for fracture of indeterminate age. Electronically Signed   By: Rosalene Colon M.D.   On: 04/12/2024 11:42    methylPREDNISolone  acetate (DEPO-MEDROL ) injection 80 mg     Date Action Dose Route User   04/12/2024 1048 Given 80 mg Intramuscular (Left Deltoid) Shana Daring, CMA          Latest Ref Rng & Units 08/13/2018   10:52 AM  PFT Results  FVC-Pre L 2.99   FVC-Predicted Pre % 73   Pre FEV1/FVC % % 62   FEV1-Pre L 1.85   FEV1-Predicted Pre % 63   DLCO  uncorrected ml/min/mmHg 23.00   DLCO UNC% % 75   DLCO corrected ml/min/mmHg 22.69   DLCO COR %Predicted % 74   DLVA Predicted % 108   TLC L 5.95   TLC % Predicted % 88   RV % Predicted % 120     Lab Results  Component Value Date   NITRICOXIDE 26 04/12/2024        Assessment & Plan:   Asthma with COPD (HCC) Chronic moderate obstructive lung disease with hx of asthma and complicated by bronchiectasis. He is a former smoker with 20 pack year history. Maintained on Symbicort . Current exacerbation as indicated by ongoing cough, dyspnea and elevated exhaled nitric oxide testing consistent with type II inflammation. Question allergy  component given timing of onset and hx of chronic sinus disease. Advised him to start daily antihistamine for trigger prevention. Will treat him with depo 80 mg inj x 1 and prednisone  taper. Cough control measures advised. CXR today to rule out superimposed infection. Continue mucociliary clearance therapies. Action plan in place. Check CBC for peripheral eosinophilia and IgE at follow up. Could consider addition of biologic therapy, pending results, or inhaled phosphodiesterase inhibitor if symptoms persist.   Patient Instructions  Continue Symbicort  2 puffs Twice daily. Brush tongue and rinse mouth afterwards Continue Albuterol  inhaler 2 puffs or levalbuterol  3 mL neb every 6 hours as needed for shortness of breath or wheezing. Notify if symptoms persist despite rescue inhaler/neb use.  Continue montelukast  1 tab daily Continue pantoprazole  Twice daily for reflux Continue mucinex  DM Twice daily for cough/congestion  Start daily non drowsy allergy  pill such as claritin, zyrtec or xyzal.  Prednisone  taper. 4 tabs for 3 days, then 3 tabs for 3 days, 2 tabs for 3 days, then 1 tab for 3 days, then stop. Take in WM with food. Start tomorrow  Benzonatate 1 capsule Three times a day as  needed for cough  Steroid shot today Chest x ray today  We will obtain some  lab work when you come back and decide if we need to add any additional therapies, pending how you are feeling  If you are feeling better and breathing is improved, you can move forward with your retina surgery next week since you would be awake and not under anesthesia. If you're still having difficulties, I would recommend postponing. You will also need to notify the surgeon you're on steroids   Follow up in 4-6 weeks with Katie Selda Jalbert,NP. Schedule appt with Dr. Diania Fortes in 3 months to establish care in [place]. If symptoms do not improve or worsen, please contact office for sooner follow up or seek emergency care.    Bronchiectasis without complication (HCC) Reduced sputum production and non-purulent. Would not recommend further antimicrobial therapy, pending CXR. See above plan. If symptoms fail to improve or worsen, may need to obtain further imaging and sputum cultures.   Chronic maxillary sinusitis No acute worsening per his report. Follow up with ENT as scheduled. Will obtain IgE at follow up - consider allergy  referral.   Preoperative respiratory examination Advised we do not provide clearance for surgery, as this should be decided upon between pt, surgeon, and anesthesia. We will complete surgical risk assessment. Advised him that if he is not improved come surgery time, he would need to postpone as he would be high risk to undergo surgery during acute exacerbation. He verbalized understanding.  If he is improved and DOE baseline, he can discuss with surgeon to decide if it is appropriate to complete his surgery while on steroids as he will still be on them come 5/1.    Moderate to high risk, pending status and type of anesthesia. Factors that increase the risk for postoperative pulmonary complications are COPD/asthma, bronchiectasis, age, obesity.  Respiratory complications generally occur in 1% of ASA Class I patients, 5% of ASA Class II and 10% of ASA Class III-IV patients These  complications rarely result in mortality and include postoperative pneumonia, atelectasis, pulmonary embolism, ARDS and increased time requiring postoperative mechanical ventilation.   Overall, I recommend proceeding with the surgery if the risk for respiratory complications are outweighed by the potential benefits and if patient is no longer in acute exacerbation/breathing is baseline. This will need to be discussed between the patient and surgeon.   To reduce risks of respiratory complications, I recommend: --Pre- and post-operative incentive spirometry performed frequently while awake --Inpatient use of currently prescribed bronchodilators  --Short duration of surgery as much as possible and avoid paralytic if possible. --OOB, encourage mobility post-op   Advised if symptoms do not improve or worsen, to please contact office for sooner follow up or seek emergency care.   I spent 45 minutes of dedicated to the care of this patient on the date of this encounter to include pre-visit review of records, face-to-face time with the patient discussing conditions above, post visit ordering of testing, clinical documentation with the electronic health record, making appropriate referrals as documented, and communicating necessary findings to members of the patients care team.  Roetta Clarke, NP 04/12/2024  Pt aware and understands NP's role.

## 2024-04-12 NOTE — Assessment & Plan Note (Addendum)
 Chronic moderate obstructive lung disease with hx of asthma and complicated by bronchiectasis. He is a former smoker with 20 pack year history. Maintained on Symbicort . Current exacerbation as indicated by ongoing cough, dyspnea and elevated exhaled nitric oxide testing consistent with type II inflammation. Question allergy  component given timing of onset and hx of chronic sinus disease. Advised him to start daily antihistamine for trigger prevention. Will treat him with depo 80 mg inj x 1 and prednisone  taper. Cough control measures advised. CXR today to rule out superimposed infection. Continue mucociliary clearance therapies. Action plan in place. Check CBC for peripheral eosinophilia and IgE at follow up. Could consider addition of biologic therapy, pending results, or inhaled phosphodiesterase inhibitor if symptoms persist.   Patient Instructions  Continue Symbicort  2 puffs Twice daily. Brush tongue and rinse mouth afterwards Continue Albuterol  inhaler 2 puffs or levalbuterol  3 mL neb every 6 hours as needed for shortness of breath or wheezing. Notify if symptoms persist despite rescue inhaler/neb use.  Continue montelukast  1 tab daily Continue pantoprazole  Twice daily for reflux Continue mucinex  DM Twice daily for cough/congestion  Start daily non drowsy allergy  pill such as claritin, zyrtec or xyzal.  Prednisone  taper. 4 tabs for 3 days, then 3 tabs for 3 days, 2 tabs for 3 days, then 1 tab for 3 days, then stop. Take in WM with food. Start tomorrow  Benzonatate 1 capsule Three times a day as needed for cough  Steroid shot today Chest x ray today  We will obtain some lab work when you come back and decide if we need to add any additional therapies, pending how you are feeling  If you are feeling better and breathing is improved, you can move forward with your retina surgery next week since you would be awake and not under anesthesia. If you're still having difficulties, I would recommend  postponing. You will also need to notify the surgeon you're on steroids   Follow up in 4-6 weeks with Katie Lucy Boardman,NP. Schedule appt with Dr. Diania Fortes in 3 months to establish care in [place]. If symptoms do not improve or worsen, please contact office for sooner follow up or seek emergency care.

## 2024-04-15 ENCOUNTER — Encounter (HOSPITAL_COMMUNITY): Payer: Self-pay

## 2024-04-15 ENCOUNTER — Ambulatory Visit: Payer: No Typology Code available for payment source | Attending: Student | Admitting: Student

## 2024-04-15 ENCOUNTER — Other Ambulatory Visit: Payer: Self-pay

## 2024-04-15 ENCOUNTER — Emergency Department (HOSPITAL_COMMUNITY)

## 2024-04-15 ENCOUNTER — Inpatient Hospital Stay (HOSPITAL_COMMUNITY)
Admission: EM | Admit: 2024-04-15 | Discharge: 2024-04-18 | DRG: 291 | Disposition: A | Discharge status: Home / Self Care | Attending: Internal Medicine | Admitting: Internal Medicine

## 2024-04-15 ENCOUNTER — Encounter: Payer: Self-pay | Admitting: Student

## 2024-04-15 VITALS — BP 129/79 | HR 76 | Ht 69.0 in | Wt 216.6 lb

## 2024-04-15 DIAGNOSIS — I4819 Other persistent atrial fibrillation: Secondary | ICD-10-CM

## 2024-04-15 DIAGNOSIS — M7989 Other specified soft tissue disorders: Secondary | ICD-10-CM | POA: Diagnosis not present

## 2024-04-15 DIAGNOSIS — J479 Bronchiectasis, uncomplicated: Secondary | ICD-10-CM | POA: Diagnosis not present

## 2024-04-15 DIAGNOSIS — Z8249 Family history of ischemic heart disease and other diseases of the circulatory system: Secondary | ICD-10-CM

## 2024-04-15 DIAGNOSIS — E782 Mixed hyperlipidemia: Secondary | ICD-10-CM

## 2024-04-15 DIAGNOSIS — J441 Chronic obstructive pulmonary disease with (acute) exacerbation: Secondary | ICD-10-CM | POA: Diagnosis present

## 2024-04-15 DIAGNOSIS — I4811 Longstanding persistent atrial fibrillation: Principal | ICD-10-CM | POA: Diagnosis present

## 2024-04-15 DIAGNOSIS — R0609 Other forms of dyspnea: Secondary | ICD-10-CM | POA: Diagnosis not present

## 2024-04-15 DIAGNOSIS — G4733 Obstructive sleep apnea (adult) (pediatric): Secondary | ICD-10-CM | POA: Diagnosis present

## 2024-04-15 DIAGNOSIS — I2510 Atherosclerotic heart disease of native coronary artery without angina pectoris: Secondary | ICD-10-CM

## 2024-04-15 DIAGNOSIS — E785 Hyperlipidemia, unspecified: Secondary | ICD-10-CM | POA: Diagnosis present

## 2024-04-15 DIAGNOSIS — R0600 Dyspnea, unspecified: Secondary | ICD-10-CM | POA: Diagnosis not present

## 2024-04-15 DIAGNOSIS — R0602 Shortness of breath: Secondary | ICD-10-CM | POA: Diagnosis not present

## 2024-04-15 DIAGNOSIS — I4891 Unspecified atrial fibrillation: Secondary | ICD-10-CM | POA: Diagnosis not present

## 2024-04-15 DIAGNOSIS — E875 Hyperkalemia: Secondary | ICD-10-CM | POA: Diagnosis present

## 2024-04-15 DIAGNOSIS — I110 Hypertensive heart disease with heart failure: Secondary | ICD-10-CM | POA: Diagnosis not present

## 2024-04-15 DIAGNOSIS — N179 Acute kidney failure, unspecified: Secondary | ICD-10-CM | POA: Diagnosis present

## 2024-04-15 DIAGNOSIS — I10 Essential (primary) hypertension: Secondary | ICD-10-CM

## 2024-04-15 DIAGNOSIS — I5033 Acute on chronic diastolic (congestive) heart failure: Secondary | ICD-10-CM | POA: Diagnosis present

## 2024-04-15 DIAGNOSIS — E66811 Obesity, class 1: Secondary | ICD-10-CM | POA: Diagnosis present

## 2024-04-15 DIAGNOSIS — Z87891 Personal history of nicotine dependence: Secondary | ICD-10-CM

## 2024-04-15 DIAGNOSIS — D649 Anemia, unspecified: Secondary | ICD-10-CM | POA: Diagnosis present

## 2024-04-15 DIAGNOSIS — J471 Bronchiectasis with (acute) exacerbation: Secondary | ICD-10-CM

## 2024-04-15 DIAGNOSIS — Z91048 Other nonmedicinal substance allergy status: Secondary | ICD-10-CM

## 2024-04-15 DIAGNOSIS — Z823 Family history of stroke: Secondary | ICD-10-CM

## 2024-04-15 DIAGNOSIS — J9811 Atelectasis: Secondary | ICD-10-CM | POA: Diagnosis not present

## 2024-04-15 DIAGNOSIS — Z7901 Long term (current) use of anticoagulants: Secondary | ICD-10-CM

## 2024-04-15 DIAGNOSIS — Z79899 Other long term (current) drug therapy: Secondary | ICD-10-CM

## 2024-04-15 DIAGNOSIS — Z6831 Body mass index (BMI) 31.0-31.9, adult: Secondary | ICD-10-CM

## 2024-04-15 DIAGNOSIS — Z1152 Encounter for screening for COVID-19: Secondary | ICD-10-CM

## 2024-04-15 DIAGNOSIS — Z8261 Family history of arthritis: Secondary | ICD-10-CM

## 2024-04-15 DIAGNOSIS — Z7951 Long term (current) use of inhaled steroids: Secondary | ICD-10-CM

## 2024-04-15 DIAGNOSIS — Z83719 Family history of colon polyps, unspecified: Secondary | ICD-10-CM

## 2024-04-15 DIAGNOSIS — Z91148 Patient's other noncompliance with medication regimen for other reason: Secondary | ICD-10-CM

## 2024-04-15 LAB — BASIC METABOLIC PANEL WITH GFR
Anion gap: 13 (ref 5–15)
BUN: 27 mg/dL — ABNORMAL HIGH (ref 8–23)
CO2: 20 mmol/L — ABNORMAL LOW (ref 22–32)
Calcium: 10.2 mg/dL (ref 8.9–10.3)
Chloride: 107 mmol/L (ref 98–111)
Creatinine, Ser: 1.33 mg/dL — ABNORMAL HIGH (ref 0.61–1.24)
GFR, Estimated: 54 mL/min — ABNORMAL LOW (ref >60)
Glucose, Bld: 140 mg/dL — ABNORMAL HIGH (ref 70–99)
Potassium: 5.2 mmol/L — ABNORMAL HIGH (ref 3.5–5.1)
Sodium: 140 mmol/L (ref 135–145)

## 2024-04-15 LAB — URINALYSIS, COMPLETE (UACMP) WITH MICROSCOPIC
Bilirubin Urine: NEGATIVE
Glucose, UA: NEGATIVE mg/dL
Hgb urine dipstick: NEGATIVE
Ketones, ur: NEGATIVE mg/dL
Leukocytes,Ua: NEGATIVE
Nitrite: NEGATIVE
Protein, ur: NEGATIVE mg/dL
Specific Gravity, Urine: 1.008 (ref 1.005–1.030)
pH: 5 (ref 5.0–8.0)

## 2024-04-15 LAB — CBC
HCT: 43.1 % (ref 39.0–52.0)
Hemoglobin: 13 g/dL (ref 13.0–17.0)
MCH: 28.1 pg (ref 26.0–34.0)
MCHC: 30.2 g/dL (ref 30.0–36.0)
MCV: 93.1 fL (ref 80.0–100.0)
Platelets: 368 10*3/uL (ref 150–400)
RBC: 4.63 MIL/uL (ref 4.22–5.81)
RDW: 17.3 % — ABNORMAL HIGH (ref 11.5–15.5)
WBC: 7 10*3/uL (ref 4.0–10.5)
nRBC: 0 % (ref 0.0–0.2)

## 2024-04-15 LAB — I-STAT VENOUS BLOOD GAS, ED
Acid-base deficit: 1 mmol/L (ref 0.0–2.0)
Bicarbonate: 24 mmol/L (ref 20.0–28.0)
Calcium, Ion: 1.29 mmol/L (ref 1.15–1.40)
HCT: 37 % — ABNORMAL LOW (ref 39.0–52.0)
Hemoglobin: 12.6 g/dL — ABNORMAL LOW (ref 13.0–17.0)
O2 Saturation: 93 %
Potassium: 4 mmol/L (ref 3.5–5.1)
Sodium: 140 mmol/L (ref 135–145)
TCO2: 25 mmol/L (ref 22–32)
pCO2, Ven: 38.8 mmHg — ABNORMAL LOW (ref 44–60)
pH, Ven: 7.399 (ref 7.25–7.43)
pO2, Ven: 68 mmHg — ABNORMAL HIGH (ref 32–45)

## 2024-04-15 LAB — LACTIC ACID, PLASMA: Lactic Acid, Venous: 1.5 mmol/L (ref 0.5–1.9)

## 2024-04-15 LAB — BRAIN NATRIURETIC PEPTIDE: B Natriuretic Peptide: 644.3 pg/mL — ABNORMAL HIGH (ref 0.0–100.0)

## 2024-04-15 LAB — TROPONIN I (HIGH SENSITIVITY)
Troponin I (High Sensitivity): 6 ng/L (ref <18)
Troponin I (High Sensitivity): 7 ng/L (ref <18)

## 2024-04-15 LAB — TSH: TSH: 0.735 u[IU]/mL (ref 0.350–4.500)

## 2024-04-15 LAB — SODIUM, URINE, RANDOM: Sodium, Ur: 114 mmol/L

## 2024-04-15 LAB — CREATININE, URINE, RANDOM: Creatinine, Urine: 22 mg/dL

## 2024-04-15 MED ORDER — METOPROLOL TARTRATE 5 MG/5ML IV SOLN
5.0000 mg | INTRAVENOUS | Status: DC | PRN
  Administered 2024-04-15 (×2): 5 mg via INTRAVENOUS
  Filled 2024-04-15 (×2): qty 5

## 2024-04-15 MED ORDER — FUROSEMIDE 10 MG/ML IJ SOLN
40.0000 mg | Freq: Once | INTRAMUSCULAR | Status: AC
  Administered 2024-04-15: 40 mg via INTRAVENOUS
  Filled 2024-04-15: qty 4

## 2024-04-15 NOTE — Assessment & Plan Note (Addendum)
 Patient given a dose of Lasix  in the ER Monitor BP.  For tonight continue Toprol  but will need to discuss with cardiology long standing management

## 2024-04-15 NOTE — Assessment & Plan Note (Addendum)
 No longer uses CPAP patient declined

## 2024-04-15 NOTE — Patient Instructions (Addendum)
 Medication Instructions:  NO CHANGES *If you need a refill on your cardiac medications before your next appointment, please call your pharmacy*  Other Instructions GO DIRECTLY TO THE ER  Your next appointment:   AFTER ER   Provider:   Lauro Portal, MD or Sharren Decree, PA-C          We recommend signing up for the patient portal called "MyChart".  Sign up information is provided on this After Visit Summary.  MyChart is used to connect with patients for Virtual Visits (Telemedicine).  Patients are able to view lab/test results, encounter notes, upcoming appointments, etc.  Non-urgent messages can be sent to your provider as well.   To learn more about what you can do with MyChart, go to [email or web address].

## 2024-04-15 NOTE — Assessment & Plan Note (Signed)
 Chronic stable patient not on aspirin  continue statin Crestor 

## 2024-04-15 NOTE — ED Provider Notes (Signed)
 [place] EMERGENCY DEPARTMENT AT [facility] Provider Note  CSN: [id number] Arrival date & time: 04/15/24 1835  Chief Complaint(s) Atrial Fibrillation  HPI Mario [name] is a 78 y.o. male with a history of atrial fibrillation, went to his cardiologist today for a follow-up appointment.  While there, he was complaining of increased welling in his legs, shortness of breath.  He was noted to have heart rate between 120 and 150.  They sent him to the emergency room for admission.   Past Medical History Past Medical History:  Diagnosis Date   Asthma    Atrial fibrillation (HCC)    Biliary colic 2014   Bronchiectasis (HCC)    2021   COPD (chronic obstructive pulmonary disease) with chronic bronchitis (HCC)    Coronary artery calcification seen on CAT scan    Cyst of pancreas    1 cm (7/18), repeat ct in 12 months.     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   Bilateral hearing loss 07/31/2023   Pain of right shoulder joint on movement 01/12/2023   History of COVID-19 12/14/2020   Healthcare maintenance 12/14/2020   Medication management 07/13/2020   Acute calculous cholecystitis 09/13/2019   Chronic anticoagulation 09/13/2019   Hiatal hernia 09/13/2019   History of colonic polyps 09/13/2019   Diverticulosis of sigmoid colon 09/13/2019   Fatty liver 09/13/2019   Gastric reflux 09/13/2019   Cholecystitis 09/13/2019   Cyst of pancreas    Renal disorder    Coronary artery calcification seen on CAT scan    Chronic cough 08/21/2018   Chronic frontal sinusitis 07/31/2018   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   Deviated nasal septum  07/31/2018   Asthma, chronic, moderate persistent vs ACOS in former smoker  07/06/2018   Bronchiectasis without complication (HCC) 07/05/2018   Paroxysmal atrial fibrillation (HCC) 12/29/2017   Asthma with COPD (HCC)    Hyperlipidemia    Biliary colic 2014   Essential hypertension 12/24/2009   KNEE PAIN, LEFT 12/24/2009   MEDIAL MENISCUS TEAR, LEFT 12/24/2009   Home Medication(s) Prior to Admission medications   Medication Sig Start Date End Date Taking? Authorizing Provider  acetaminophen  (TYLENOL ) 500 MG tablet Take 1,000 mg by mouth every 6 (six) hours as needed.    [provider]  apixaban  (ELIQUIS ) 5 MG TABS tablet TAKE 1 TABLET BY MOUTH TWICE A DAY 02/26/24   Avanell Leigh, MD  Ascorbic Acid (VITAMIN C) 1000 MG tablet Take 1,000 mg by mouth 2 (two) times daily. 11/18/20   [provider]  benzonatate (TESSALON) 200 MG capsule Take 1 capsule (200 mg total) by mouth 3 (three) times daily as needed for cough. 04/12/24   Cobb, Mariah Shines, NP  budesonide -formoterol  (SYMBICORT ) 160-4.5 MCG/ACT inhaler Inhale 2 puffs into the lungs in the morning and at bedtime. Generic medication 12/22/23   Austine Lefort, MD  Cholecalciferol (VITAMIN D3) 125 MCG (5000 UT) TABS Take by mouth.    [provider]  Coenzyme Q10 (CO Q-10) 100 MG CAPS Take 1 capsule by mouth daily.    [provider]  doxycycline  (VIBRAMYCIN ) 100 MG capsule Take 100  mg by mouth 2 (two) times daily.    [provider]  FIBER COMPLETE PO Take 1 tablet by mouth daily.    [provider]  furosemide  (LASIX ) 40 MG tablet TAKE 1 TABLET (40 MG TOTAL) BY MOUTH DAILY. STOP HCTZ 02/08/24   Austine Lefort, MD  levalbuterol  (XOPENEX ) 0.63 MG/3ML nebulizer solution Take 3 mLs (0.63 mg total) by nebulization every 4 (four) hours as needed for wheezing or shortness of breath. 02/18/24 02/17/25  [place], Eric J, DO  metoprolol  succinate (TOPROL -XL) 100 MG 24 hr tablet Take 2 tablets (200 mg  total) by mouth daily. Take with or immediately following a meal. 03/01/24   Austine Lefort, MD  montelukast  (SINGULAIR ) 10 MG tablet Take 1 tablet (10 mg total) by mouth daily. 09/11/23   Austine Lefort, MD  pantoprazole  (PROTONIX ) 40 MG tablet Take 1 tablet (40 mg total) by mouth 2 (two) times daily. 09/11/23   Austine Lefort, MD  predniSONE  (DELTASONE ) 10 MG tablet 4 tabs for 3 days, then 3 tabs for 3 days, 2 tabs for 3 days, then 1 tab for 3 days, then stop 04/12/24   Cleo Dace, Mariah Shines, NP  Respiratory Therapy Supplies (FLUTTER) DEVI Use as directed 07/05/18   Diamond Formica, MD  rosuvastatin  (CRESTOR ) 20 MG tablet TAKE 1 TABLET BY MOUTH EVERY DAY **D/C LIPITOR** 02/08/24   Austine Lefort, MD                                                                                                                                    Past Surgical History Past Surgical History:  Procedure Laterality Date   COLONOSCOPY  09/29/2014   INCISION AND DRAINAGE OF WOUND Left 03/13/2024   Procedure: IRRIGATION AND DEBRIDEMENT WOUND;  Surgeon: Arvil Birks, MD;  Location: [facility] OR;  Service: Orthopedics;  Laterality: Left;  IRRIGATION AND DEBRIDEMENT FIFTH FINGER LEFT   LAPAROSCOPIC CHOLECYSTECTOMY SINGLE SITE WITH INTRAOPERATIVE CHOLANGIOGRAM N/A 09/15/2019   Procedure: LAPAROSCOPIC CHOLECYSTECTOMY SINGLE SITE;  Surgeon: Candyce Champagne, MD;  Location: [facility] ORS;  Service: General;  Laterality: N/A;   LITHOTRIPSY  2000   for kidney stone   Family History Family History  Problem Relation Age of Onset   Rheumatic fever Mother    Stroke Father    Atrial fibrillation Father    Colon polyps Father    Arthritis Sister    Colon cancer Neg Hx    Esophageal cancer Neg Hx    Rectal cancer Neg Hx    Stomach cancer Neg Hx     Social History Social History   Tobacco Use   Smoking status: Former    Current packs/day: 0.00    Average packs/day: 2.0 packs/day for 11.0 years (22.0 ttl pk-yrs)    Types: Cigarettes     Start date: 12/20/1963    Quit date: 12/19/1974    Years since quitting: 49.3   Smokeless  tobacco: Never  Vaping Use   Vaping status: Never Used  Substance Use Topics   Alcohol use: Not Currently    Alcohol/week: 1.0 standard drink of alcohol    Types: 1 Glasses of wine per week   Drug use: No   Allergies Dust mite extract  Review of Systems Review of Systems  Physical Exam Vital Signs  I have reviewed the triage vital signs BP 126/84   Pulse 88   Temp 98.4 F (36.9 C)   Resp 20   Ht 5\' 9"  (1.753 m)   Wt 98 kg   SpO2 98%   BMI 31.90 kg/m   Physical Exam Vitals reviewed.  HENT:     Head: Normocephalic.  Cardiovascular:     Rate and Rhythm: Tachycardia present. Rhythm irregular.  Pulmonary:     Effort: Pulmonary effort is normal.  Abdominal:     General: Abdomen is flat.     Palpations: Abdomen is soft.  Skin:    General: Skin is warm.  Neurological:     General: No focal deficit present.     Mental Status: He is alert.     ED Results and Treatments Labs (all labs ordered are listed, but only abnormal results are displayed) Labs Reviewed  BASIC METABOLIC PANEL WITH GFR - Abnormal; Notable for the following components:      Result Value   Potassium 5.2 (*)    CO2 20 (*)    Glucose, Bld 140 (*)    BUN 27 (*)    Creatinine, Ser 1.33 (*)    GFR, Estimated 54 (*)    All other components within normal limits  CBC - Abnormal; Notable for the following components:   RDW 17.3 (*)    All other components within normal limits  BRAIN NATRIURETIC PEPTIDE - Abnormal; Notable for the following components:   B Natriuretic Peptide 644.3 (*)    All other components within normal limits  TROPONIN I (HIGH SENSITIVITY)  TROPONIN I (HIGH SENSITIVITY)                                                                                                                          Radiology DG Chest Port 1 View Result Date: 04/15/2024 EXAM: 1 VIEW(S) XRAY OF THE CHEST  04/15/2024 07:39:00 PM COMPARISON: 04/12/2024 CLINICAL HISTORY: 5107 Atrial fibrillation (HCC) 5107. Reports increased SOB and leg swelling. Saw pulmonology today and cardiology. Patient hx of Afib and was here approx 2 months ago for same. Patient sent by cardiology for Afib RVR 120-150s. FINDINGS: LUNGS AND PLEURA: Mild bibasilar scarring/atelectasis, unchanged. No consolidation. No pulmonary edema. No pleural effusion. No pneumothorax. HEART AND MEDIASTINUM: No acute abnormality of the cardiac and mediastinal silhouettes. BONES AND SOFT TISSUES: No acute osseous abnormality. IMPRESSION: 1. No acute cardiopulmonary pathology. Electronically signed by: Zadie Herter MD 04/15/2024 08:04 PM EDT RP Workstation: [id number]    Pertinent labs & imaging results that were available during my care of the patient were  reviewed by me and considered in my medical decision making (see MDM for details).  Medications Ordered in ED Medications  metoprolol  tartrate (LOPRESSOR ) injection 5 mg (5 mg Intravenous Given 04/15/24 2053)  furosemide  (LASIX ) injection 40 mg (40 mg Intravenous Given 04/15/24 1934)                                                                                                                                     Procedures Procedures  (including critical care time)  Medical Decision Making / ED Course   This patient presents to the ED for concern of atrial fibrillation and dyspnea, this involves an extensive number of treatment options, and is a complaint that carries with it a high risk of complications and morbidity.  The differential diagnosis includes heart failure, less likely ACS, pulmonary edema.  MDM: Patient mildly fluid overloaded.  Cardiology not recommending cardioversion for this patient as they are preferred to medically optimize, potentially perform TEE on the patient.  My independent review of the  patient's chest x-ray shows no pneumonia.  Troponin flat.  BNP elevated.   Gave the patient 40 of Lasix , which should help with his mild hyperkalemia of 5.2.  Spoke with cardiologist, Dr. Renna Cary, who recommended admission.  Will admit to hospitalist.   Additional history obtained: -Additional history obtained from EMS -External records from outside source obtained and reviewed including: Chart review including previous notes, labs, imaging, consultation notes   Lab Tests: -I ordered, reviewed, and interpreted labs.   The pertinent results include:   Labs Reviewed  BASIC METABOLIC PANEL WITH GFR - Abnormal; Notable for the following components:      Result Value   Potassium 5.2 (*)    CO2 20 (*)    Glucose, Bld 140 (*)    BUN 27 (*)    Creatinine, Ser 1.33 (*)    GFR, Estimated 54 (*)    All other components within normal limits  CBC - Abnormal; Notable for the following components:   RDW 17.3 (*)    All other components within normal limits  BRAIN NATRIURETIC PEPTIDE - Abnormal; Notable for the following components:   B Natriuretic Peptide 644.3 (*)    All other components within normal limits  TROPONIN I (HIGH SENSITIVITY)  TROPONIN I (HIGH SENSITIVITY)      EKG atrial fibrillation  EKG Interpretation Date/Time:    Ventricular Rate:    PR Interval:    QRS Duration:    QT Interval:    QTC Calculation:   R Axis:      Text Interpretation:           Imaging Studies ordered: I ordered imaging studies including chest x-ray I independently visualized and interpreted imaging. I agree with the radiologist interpretation   Medicines ordered and prescription drug management: Meds ordered this encounter  Medications   metoprolol  tartrate (LOPRESSOR ) injection 5 mg   furosemide  (LASIX ) injection  40 mg    -I have reviewed the patients home medicines and have made adjustments as needed     Cardiac Monitoring: The patient was maintained on a cardiac monitor.  I personally viewed and interpreted the cardiac monitored which showed an  underlying rhythm of: Atrial fibrillation  Social Determinants of Health:  Factors impacting patients care include: Multiple medical comorbidities including atrial fibrillation, COPD   Reevaluation: After the interventions noted above, I reevaluated the patient and found that they have :improved  Co morbidities that complicate the patient evaluation  Past Medical History:  Diagnosis Date   Asthma    Atrial fibrillation (HCC)    Biliary colic 2014   Bronchiectasis (HCC)    2021   COPD (chronic obstructive pulmonary disease) with chronic bronchitis (HCC)    Coronary artery calcification seen on CAT scan    Cyst of pancreas    1 cm (7/18), repeat ct in 12 months.     Hyperlipidemia    Hypertension    Obstructive sleep apnea    Renal disorder    Rhinitis    Sleep apnea    pt denies sleep apnea-lost weight      Dispostion: Admission    Final Clinical Impression(s) / ED Diagnoses Final diagnoses:  Longstanding persistent atrial fibrillation (HCC)     @PCDICTATION @    Afton Horse T, DO 04/15/24 2058

## 2024-04-15 NOTE — Assessment & Plan Note (Addendum)
 Chronic likely contributing to dyspnea on exertion. Patient currently not on new oxygen Pulmonology sees him as an outpatient.  Consider switching to bisoprolol.  Once A-fib is under better control Xopenex  as needed Continue prednisone  continue doxycycline 

## 2024-04-15 NOTE — ED Notes (Signed)
 HR hanging 125-130. Will go ahead and give another dose of metop.

## 2024-04-15 NOTE — Subjective & Objective (Signed)
 Patient with known history of CAD and A-fib on Eliquis  went to see his primary cardiology today  Last admission for A-fib with RVR was in February 2025 treated with Cardizem  drip became hypotensive Home metoprolol  was increased  Patient has ongoing episodes of shortness of breath for which she has seen cardiology ordered cardiac PET stress test to rule out dyspnea as an anginal equivalent it was scheduled for June 11, 2024  Patient today has been having significant shortness of breath with exertion he can barely walk from the living room to the kitchen his metoprolol  has recently been increased from 50-200 daily Endorses some orthopnea had to sleep in a recliner He has chronic leg edema. Some weight loss over the past 2 months no chest pain or palpitations  Patient also have had some COPD and asthma exacerbations for which he recently been treated with antibiotics and steroids His albuterol  in the past has been changed to Xopenex  trying to avoid if he would RVR In the office found to be in A-fib with RVR heart rate 120s 150s Will recommend patient to go to ER started him on IV Cardizem  drip Repeat echo switching him from Toprol  to bisoprolol It is unclear if CHF is playing a role he has been on p.o. Lasix  at home may be not always compliant.  Patient does have bronchiectasis which could be also contributing to shortness of breath

## 2024-04-15 NOTE — ED Notes (Signed)
Pt resting, no distress noted.

## 2024-04-15 NOTE — Assessment & Plan Note (Addendum)
 Case has been discussed with cardiology by ER provider.  They recommended giving a dose of metoprolol  which seemed to have helped.  At least temporarily.  Heart rate fluctuating between mid 80s to 100 teens.  Patient is asymptomatic Given soft blood pressure would avoid over controlling heart rate Would avoid albuterol   Cardiology will see in consult in a.m. Repeat echogram.  Continue to cycle cardiac enzymes Continue Eliquis  defer to cardiology regarding long-term management

## 2024-04-15 NOTE — ED Notes (Signed)
 Pt given a urinal with instructions for use post lasix . Call bell within reach.

## 2024-04-15 NOTE — Assessment & Plan Note (Addendum)
 Given wheezing COPD exacerbation likely playing a role we will continue doxycycline  and prednisone .  Xopenex  as needed to avoid tachycardia Order respiratory panel patient have had some sick contacts as well as some GI complaints

## 2024-04-15 NOTE — Consult Note (Addendum)
 I saw the patient in clinic today and he was noted to be in atrial fibrillation with RVR. Rates were in the 150s. He was very symptomatic with this and described significant weakness and dyspnea on exertion with minimal exertion. Please see office visit note please which will serve as a consult note. Dr. Renna Cary to see in the ED.  Callie E Goodrich, PA-C 04/15/2024 7:27 PM       Cardiology Office Note:     Date:  04/15/2024    ID:  Mario [name], DOB 07-16-1946, MRN [id number]   PCP:  Austine Lefort, MD             Cardiologist:  Lauro Portal, MD {   Referring MD: Austine Lefort, MD    Chief Complaint: follow-up of atrial fibrillation and dyspnea on exertion    History of Present Illness:     Mario [name] is a 78 y.o. male with a history of three vessel coronary artery calcifications noted on prior chest CTs dating back to at least 2019 but low risk Myoview  in 04/2019, persistent atrial fibrillation on Eliquis , hypertension, hyperlipidemia, obstructive sleep apnea on CPAP, bronchiectasis, and obesity who is followed by Dr. Katheryne Pane and presents today for routine follow-up.    Patient was referred to Dr. Katheryne Pane in 12/2017 for further evaluation of paroxysmal atrial fibrillation. He reported intermittent palpitations for the past 2 years at that time. Prior Echo in 07/2015 showed normal LV function. Event Monitor was ordered and showed  normal sinus rhythm with paroxysmal atrial fibrillation. Myoview  in 04/2019, which was ordered for further evaluation of coronary calcifications noted on prior CT, was low risk and showed a very subtle reversible defect in the apex but this was felt to likely represent variation in diaphragmatic attenuation. He is now felt to have persistent atrial fibrillation.    He was recently admitted from 02/15/2024 to 02/18/2024 for atrial fibrillation with RVR after presenting with progressive dyspnea. He was initially started on a Cardizem  drip but became mildly  hypotensive this so it was stopped and home Metoprolol  was increased. Albuterol  was stopped due to concern that this was causing elevated hear rates and he was discharged on Xopenex  instead.    He was most recently seen by Palmer Bobo, NP, on 03/12/2024 at which time he reported persistent weakness and ongoing shortness of breath. Cardiac PET stress test was ordered to rule out dyspnea as an anginal equivalent. This is scheduled fo 06/11/2024.    Patient presents today for follow-up. Here with daughter.  Patient states he is not doing well.  He describes significant weakness  and dyspnea on exertion since being discharged from the hospital.  He states he can barely walk from the living room to the kitchen in his house without having to stop.  He was not like this prior to his admission 2 months ago and daughter is wondering whether this is due to the significant increase in his Metoprolol  from 50mg  to 200mg  daily.  He has been sleeping on an incline since last hospitalization there is - this is partly due to back pain but it also sounds like he has some degree of orthopnea.  No PND.  He has chronic lower extremity edema but states this has been stable.  He reports a 30 pound weight loss about last 2 months.  He denies any chest pain, palpitations, lightheadedness, dizziness, syncope.  He has also been struggling with recurrent COPD/asthma exacerbations and is currently on antibiotics and  steroids.   EKG at the end of visit showed atrial fibrillation with rates in the 150s.  Will send to the ED.   EKGs/Labs/Other Studies Reviewed:     The following studies were reviewed today:   Monitor 01/08/2018 to 02/06/2018: NSR with PAF. _______________   Myoview  05/15/2019: Nuclear stress EF: 54%. The left ventricular ejection fraction is mildly decreased (45-54%). There was no ST segment deviation noted during stress. There is a very subtle defect of mild severity present in the apex location. The defect is  reversible but likely represents variations in diaphragmatic attenuation. This is a low risk study. _______________   Echocardiogram 10/31/2023: Impressions: 1. Left ventricular ejection fraction, by estimation, is 50 to 55%. The  left ventricle has low normal function. The left ventricle has no regional  wall motion abnormalities. There is mild left ventricular hypertrophy.  Left ventricular diastolic  parameters are indeterminate.   2. Right ventricular systolic function is normal. The right ventricular  size is normal. There is normal pulmonary artery systolic pressure. The  estimated right ventricular systolic pressure is 16.5 mmHg.   3. Left atrial size was mildly dilated.   4. The mitral valve is normal in structure. No evidence of mitral valve  regurgitation.   5. The aortic valve is tricuspid. Aortic valve regurgitation is not  visualized.   6. The inferior vena cava is normal in size with greater than 50%  respiratory variability, suggesting right atrial pressure of 3 mmHg.    EKG:  EKG ordered today.    EKG Interpretation Date/Time:                  Monday April 15 2024 16:35:07 EDT Ventricular Rate:         154 PR Interval:                   QRS Duration:             74 QT Interval:                 148 QTC Calculation:237 R Axis:                         -30   Text Interpretation:Atrial fibrillation with RVR Left axis deviation Nonspecific ST and T wave abnormality Confirmed by Goodrich, Callie [phone]) on 04/15/2024 5:03:30 PM      Recent Labs: 09/11/2023: ALT 21 02/15/2024: B Natriuretic Peptide 230.0 02/16/2024: Hemoglobin 10.6; Platelets 325; TSH 1.691 02/17/2024: Magnesium 2.2 03/12/2024: BUN 29; Creatinine, Ser 1.00; Potassium 4.8; Sodium 141  Recent Lipid Panel Labs (Brief)          Component Value Date/Time    CHOL 133 03/26/2021 0855    TRIG 78 03/26/2021 0855    HDL 60 03/26/2021 0855    CHOLHDL 2.2 03/26/2021 0855    VLDL 15 02/18/2016 0851    LDLCALC  57 03/26/2021 0855        Physical Exam:     Vital Signs: BP 129/79 (BP Location: Left Arm, Patient Position: Sitting, Cuff Size: Normal)   Pulse 76   Ht 5\' 9"  (1.753 m)   Wt 216 lb 9.6 oz (98.2 kg)   SpO2 95%   BMI 31.99 kg/m         Wt Readings from Last 3 Encounters:  04/15/24 216 lb 9.6 oz (98.2 kg)  04/12/24 217 lb 6.4 oz (98.6 kg)  03/22/24 217 lb 3.2 oz (98.5 kg)  General: 78 y.o. Caucasian male in no acute distress. HEENT: Normocephalic and atraumatic. Sclera clear.  Heart: Tachycardic with irregularly irregular rhythm. Distant heart sounds. No murmurs, gallops, or rubs.  Lungs: No increased work of breathing. Mild crackles noted in bilateral bases. No wheezes or rhonchi.  Extremities: 1+ pitting edema of bilateral lower extremities. Skin: Warm and dry. Neuro: No focal deficits. Psych: Normal affect. Responds appropriately.     Assessment:     1. Persistent atrial fibrillation (HCC)   2. Dyspnea on exertion   3. Coronary artery calcification   4. Primary hypertension   5. Hyperlipidemia, unspecified hyperlipidemia type   6. Obstructive sleep apnea       Plan:     Atrial Fibrillation with RVR Patient has history of atrial fibrillation which she has been felt to be persistent.  Last documented episode of sinus rhythm was in 12/2022.  He was recently admitted at the end of 01/2024 for rapid atrial fibrillation and home metoprolol  was significantly increased.  Of note, Cardiology was not consulted during that admission.  He presents to the office today with reports of significant weakness and dyspnea on exertion and was found to be in rapid atrial fibrillation with rates in the 150s.  Will send to the ED. Patient has a history of COPD/ asthma and bronchiectasis and has had recurrent issues for COPD/ asthma exacerbation. He is currently on antibiotics and steroids. Therefore, recommend Internal Medicine to admit but Cardiology will follow along. Will update inpatient  team. Discussed plan with DOD (Dr. Katheryne Pane) who agreed.    In the ED, recommend: - Starting IV Cardizem  drip. EF was previously in 50-55%. I am concerned that this may have dropped since then so will need repeat Echo. If EF is now down, will ultimately need to stop Cardizem . - Consider stopping Toprol -XL and switching to Bisoprolol. Patient has COPD/asthma and bronchiectasis and has been feeling poorly after Toprol -XL was significantly increased during last admission. Discuss this with Pharmacy in the office who agreed wit this.  - Continue Eliquis  5mg  twice daily.  - RVR may be secondary to COPD exacerbation with steroid use and increased use of bronchodilators. Although we typically do not rush to cardiovert someone with a COPD exacerbation, he may end of need TEE/ DCCV this admission. He is not sure if he has missed a dose of Eliquis  in the last 3 weeks.    Possible Acute Diastolic CHF Patient reports significant dyspnea with minimal exertion. Concerned for acute CHF in setting of rapid atrial fibrillation.  - He does have mild crackles in bilateral bases and 1+ edema on exam. However, he reports his weight is down 30lbs since last admission 2 months. Chest x-ray last week showed minimal bibasilar subsegmental atelectasis or scarring with minimal left pleural effusion.  - He is on PO Lasix  40mg  at home but does not take this every day as prescribed.    In the ED, recommend.  - Repeating chest x-ray. - Checking BNP, BMET, CBC.  - Updating Echo. - Consider giving a dose of IV Lasix  40mg  daily depending on BNP and renal function.   Dyspnea on Exertion Patient has significant dyspnea on exertion. Echo in 10/2023 showed LVEF of 50-55% with normal wall motion. - Patient continues to have significant dyspnea on exertion.  - He does have a history of bronchiectasis which could be the etiology of his dyspnea. This is followed by Pulmonology. However, this could also be secondary to atrial fibrillation  and suspect CHF.  Cardiac PET stress test was ordered at last visit to rule out dyspnea as an anginal equivalent. This is scheduled for 06/12/2023.    Coronary Artery Calcifications 3-vessel coronary artery calcifications have been noted on prior chest CTs dating back to at least 2019. Myoview  in 04/2019 was low risk.  - No chest pain.  - No aspirin  given need for full anticoagulation.  - Continue statin.   Hypertension BP well controlled.   - Continue rate control agents for atrial fibrillation as above.   Hyperlipidemia - Continue Crestor  20mg  daily.    Obstructive Sleep Apnea - Continue CPAP.  Personally seen and examined. Agree with above.  78 year old male with atrial fibrillation rapid ventricular response currently in the 100-120 range with known coronary artery calcifications seen on prior CT low risk Myoview  in 2020 who was recently admitted in late February 2025 for atrial fibrillation RVR with progressive dyspnea he was hypotensive on Cardizem  drip so this was stopped and home metoprolol  was increased.  Albuterol  was stopped because of concerns of elevated heart rate and he was discharged on Xopenex  cardiac PET with stress was set up for June 2025 he came into the cardiology clinic earlier today and felt as though he was not doing well more dyspnea on exertion since discharge barely walk from the living room to the kitchen NYHA class III-IV type symptoms.  His metoprolol  was increased from 50 mg up to 200 mg and sleeping on an incline he has pretty bad COPD as well and is currently on antibiotics as well as steroids in the clinic today his heart rates were 150.  Last echo in 2024 showed EF of 50 to 55% his EF certainly could have dropped because of A-fib.  Irregularly irregular mildly tachycardic, 120s Fairly comfortable currently in bed.  Recent use of urinal after Lasix  Tight wheezing heard in lung fields especially left greater than right.   - We will go ahead and see in  consultation in the hospital.  Continue to follow.  He does take Lasix  40 mg at home but has not been taking it as prescribed.  Concerned about Eliquis  compliance as well. - We will go ahead and give him IV Lasix  40 mg here and may need to pursue TEE cardioversion after diuresis and improvement of his COPD exacerbation. - It may not be a bad idea to switch him over to bisoprolol as well as he has been feeling worse after increase of Toprol . - Would recommend starting on bisoprolol 5 mg once a day and uptitrating as necessary.  Dorothye Gathers, MD

## 2024-04-15 NOTE — ED Triage Notes (Signed)
 Patient sent by cardiology for Afib RVR 120-150s.  Reports increased sob and leg swelling.  Saw pulmonology today and cardiology.  Patient hx of afib and was here approx 2 months ago for same.

## 2024-04-15 NOTE — ED Notes (Signed)
 Pt resting comfortably. Denies palpitations or SHOB. Discussed another dose of metop but reports he wants to wait a few minutes to see if it comes down on it's own.

## 2024-04-15 NOTE — Assessment & Plan Note (Signed)
 Hard to say if Prerenal versus cardio renal Patient did have some episodes of nausea vomiting diarrhea yesterday. In the emergency department was given a dose of Lasix  With some urine output Given currently soft blood pressures and in the light of recent GI complaints we will hold off on further aggressive diuresis.  Last echo showed preserved EF.  Will repeat

## 2024-04-15 NOTE — H&P (Addendum)
 Mario [name] NWG:[id number] DOB: [birth year] DOA: 04/15/2024     PCP: Austine Lefort, MD   Outpatient Specialists:  [facility]  Dr. Lauro Portal, MD   Pulmonary      Patient arrived to ER on 04/15/24 at 1835 Referred by Attending Nathanael Baker, DO   Patient coming from:    home Lives  With family     Chief Complaint:   Chief Complaint  Patient presents with   Atrial Fibrillation    HPI: Mario [name] is a 78 y.o. male with medical history significant of A-fib with RVR on Eliquis , CAD, hypertension hyperlipidemia, COPD and bronchiectasis     Presented with   worsening shortness of breath wheezing Patient with known history of CAD and A-fib on Eliquis  went to see his primary cardiology today  Last admission for A-fib with RVR was in February 2025 treated with Cardizem  drip became hypotensive Home metoprolol  was increased  Patient has ongoing episodes of shortness of breath for which she has seen cardiology ordered cardiac PET stress test to rule out dyspnea as an anginal equivalent it was scheduled for June 11, 2024  Patient today has been having significant shortness of breath with exertion he can barely walk from the living room to the kitchen his metoprolol  has recently been increased from 50-200 daily Endorses some orthopnea had to sleep in a recliner He has chronic leg edema. Some weight loss over the past 2 months no chest pain or palpitations  Patient also have had some COPD and asthma exacerbations for which he recently been treated with antibiotics and steroids His albuterol  in the past has been changed to Xopenex  trying to avoid if he would RVR In the office found to be in A-fib with RVR heart rate 120s 150s Will recommend patient to go to ER started him on IV Cardizem  drip Repeat echo switching him from Toprol  to bisoprolol It is unclear if CHF is playing a role he has been on p.o. Lasix  at home may be not always compliant.  Patient does have  bronchiectasis which could be also contributing to shortness of breath    Reports chronic cough worse at night  Report he has not been eating well lsot 27 lb' no longer needing cpap Cough productive of yellow sputum  REPORTS YESTERDAY HE HAD AN EPISODE OF DIARRHEA AND VOMITING X 1   Now resolved  Has not been able to walk up the stairs as he used to Denies significant ETOH intake   Does not smoke       Regarding pertinent Chronic problems:    Hyperlipidemia - on statins crestor  Lipid Panel     Component Value Date/Time   CHOL 133 03/26/2021 0855   TRIG 78 03/26/2021 0855   HDL 60 03/26/2021 0855   CHOLHDL 2.2 03/26/2021 0855   VLDL 15 02/18/2016 0851   LDLCALC 57 03/26/2021 0855     HTN on toprol    - last echo  Recent Results (from the past 57846 hours)  ECHOCARDIOGRAM COMPLETE   Collection Time: 10/31/23  2:53 PM  Result Value   S' Lateral 2.26   Area-P 1/2 3.99   Est EF 50 - 55%   Narrative      ECHOCARDIOGRAM REPORT         1. Left ventricular ejection fraction, by estimation, is 50 to 55%. The left ventricle has low normal function. The left ventricle has no regional wall motion abnormalities. There is mild left ventricular hypertrophy.  Left ventricular diastolic  parameters are indeterminate.  2. Right ventricular systolic function is normal. The right ventricular size is normal. There is normal pulmonary artery systolic pressure. The estimated right ventricular systolic pressure is 16.5 mmHg.  3. Left atrial size was mildly dilated.  4. The mitral valve is normal in structure. No evidence of mitral valve regurgitation.  5. The aortic valve is tricuspid. Aortic valve regurgitation is not visualized.  6. The inferior vena cava is normal in size with greater than 50% respiratory variability, suggesting right atrial pressure of 3 mmHg.           CAD as per imaging- On   statin, betablocker,                 -  followed by cardiology                      COPD/bronchiectasis-  followed by pulmonology   not  on baseline oxygen  On symbicort     OSA -not on  CPAP     A. Fib -   atrial fibrillation CHA2DS2 vas score  4         current  on anticoagulation with  Eliquis ,           -  Rate control:  Currently controlled with  Toprolol,          Chronic anemia - baseline hg Hemoglobin & Hematocrit  Recent Labs    02/15/24 1354 02/16/24 0350 04/15/24 1854  HGB 11.3* 10.6* 13.0   Iron/TIBC/Ferritin/ %Sat    Component Value Date/Time   IRON 64 03/24/2023 1633   TIBC 448 (H) 07/28/2022 1623   FERRITIN 29 12/06/2022 1452   IRONPCTSAT 10 (L) 07/28/2022 1623     While in ER:    Got a dose metoprolol  5 mg IV     Lab Orders         Basic metabolic panel         CBC         Brain natriuretic peptide      CXR -  NON acute    Following Medications were ordered in ER: Medications  metoprolol  tartrate (LOPRESSOR ) injection 5 mg (5 mg Intravenous Given 04/15/24 2053)  furosemide  (LASIX ) injection 40 mg (40 mg Intravenous Given 04/15/24 1934)    _______________________________________________________ ER Provider Called:    [facility]    Dr. Renna Cary They Recommend admit to medicine   Will see in AM      ED Triage Vitals  Encounter Vitals Group     BP 04/15/24 1843 129/79     Systolic BP Percentile --      Diastolic BP Percentile --      Pulse Rate 04/15/24 1843 (!) 124     Resp 04/15/24 1843 (!) 22     Temp 04/15/24 1843 98.4 F (36.9 C)     Temp src --      SpO2 04/15/24 1843 97 %     Weight 04/15/24 1845 216 lb (98 kg)     Height 04/15/24 1845 5\' 9"  (1.753 m)     Head Circumference --      Peak Flow --      Pain Score 04/15/24 1845 0     Pain Loc --      Pain Education --      Exclude from Growth Chart --   ZOXW(96)@     _________________________________________ Significant initial  Findings: Abnormal Labs Reviewed  BASIC METABOLIC PANEL WITH GFR - Abnormal; Notable for the following components:      Result  Value   Potassium 5.2 (*)    CO2 20 (*)    Glucose, Bld 140 (*)    BUN 27 (*)    Creatinine, Ser 1.33 (*)    GFR, Estimated 54 (*)    All other components within normal limits  CBC - Abnormal; Notable for the following components:   RDW 17.3 (*)    All other components within normal limits  BRAIN NATRIURETIC PEPTIDE - Abnormal; Notable for the following components:   B Natriuretic Peptide 644.3 (*)    All other components within normal limits      _________________________ Troponin  ordered Cardiac Panel (last 3 results) Recent Labs    04/15/24 1854  TROPONINIHS 6   ECG: Ordered Personally reviewed and interpreted by me showing: HR : 122 Rhythm: Atrial fibrillation Left axis deviation Low voltage, precordial leads Abnormal R-wave progression, early transition Consider anterior infarct QTC 426  BNP (last 3 results) Recent Labs    11/24/23 1622 02/15/24 1354 04/15/24 1854  BNP 107* 230.0* 644.3*     The recent clinical data is shown below. Vitals:   04/15/24 1915 04/15/24 1938 04/15/24 2021 04/15/24 2050  BP: 111/88 126/84    Pulse: (!) 118 (!) 102 88 (!) 125  Resp: (!) 21 20 20 20   Temp:      SpO2: 99% 99% 98% 98%  Weight:      Height:        WBC     Component Value Date/Time   WBC 7.0 04/15/2024 1854   LYMPHSABS 2,130 09/11/2023 1622   MONOABS 1.3 (H) 09/16/2019 0341   EOSABS 56 09/11/2023 1622   BASOSABS 47 09/11/2023 1622          Results for orders placed or performed in visit on 03/23/23  Respiratory or Resp and Sputum Culture     Status: Abnormal   Collection Time: 03/24/23  4:13 PM   Specimen: Sputum  Result Value Ref Range Status   MICRO NUMBER: [id number]  Final   SPECIMEN QUALITY: Adequate  Final   Source SPUTUM  Final   STATUS: FINAL  Final   GRAM STAIN: (A)  Final    No white blood cells seen Few epithelial cells Many Gram negative bacilli Many Gram positive cocci Few Budding yeast seen   ISOLATE 1: Serratia liquefaciens (A)  Final     Comment: Moderate growth of Serratia liquefaciens      Susceptibility   Serratia liquefaciens - RESPIRATORY/SPUTUM CULT NEGATIVE 1    AMOX/CLAVULANIC >=32 Resistant     CEFAZOLIN* >=64 Resistant      * For infections other than uncomplicated UTI caused by E. coli, K. pneumoniae or P. mirabilis: Cefazolin is resistant if MIC > or = 8 mcg/mL. (Distinguishing susceptible versus intermediate for isolates with MIC < or = 4 mcg/mL requires additional testing.)     CEFTAZIDIME <=1 Sensitive     CEFEPIME <=1 Sensitive     CEFTRIAXONE  <=1 Sensitive     CIPROFLOXACIN  <=0.25 Sensitive     LEVOFLOXACIN  <=0.12 Sensitive     GENTAMICIN <=1 Sensitive     IMIPENEM 0.5 Sensitive     PIP/TAZO <=4 Sensitive     TOBRAMYCIN 2 Sensitive     TRIMETH/SULFA* <=20 Sensitive      * For infections other than uncomplicated UTI caused by E. coli, K. pneumoniae or P. mirabilis: Cefazolin is resistant  if MIC > or = 8 mcg/mL. (Distinguishing susceptible versus intermediate for isolates with MIC < or = 4 mcg/mL requires additional testing.) Legend: S = Susceptible  I = Intermediate R = Resistant  NS = Not susceptible * = Not tested  NR = Not reported **NN = See antimicrobic comments     _________________________________________________________  Venous  Blood Gas result:  pH  7.399 Sodium 140 mmol/L   pCO2, Ven 38.8 Low  mmHg Potassium 4.0 mmol/L  pO2, Ven 68 High  mmHg         __________________________________________________________ Recent Labs  Lab 04/15/24 1854 04/15/24 2321  NA 140 140  K 5.2* 4.0  CO2 20*  --   GLUCOSE 140*  --   BUN 27*  --   CREATININE 1.33*  --   CALCIUM  10.2  --     Cr    Up from baseline see below Lab Results  Component Value Date   CREATININE 1.33 (H) 04/15/2024   CREATININE 1.00 03/12/2024   CREATININE 0.79 02/17/2024    Lab Results  Component Value Date   CALCIUM  10.2 04/15/2024   PHOS 3.5 09/16/2019       Plt: Lab Results  Component Value Date    PLT 368 04/15/2024      Recent Labs  Lab 04/15/24 1854  WBC 7.0  HGB 13.0  HCT 43.1  MCV 93.1  PLT 368    HG/HCT stable,       Component Value Date/Time   HGB 13.0 04/15/2024 1854   HCT 43.1 04/15/2024 1854   MCV 93.1 04/15/2024 1854    _______________________________________________ Hospitalist was called for admission for  a.fib w RVR   The following Work up has been ordered so far:  Orders Placed This Encounter  Procedures   DG Chest [employer] 1 View   Basic metabolic panel   CBC   Brain natriuretic peptide   Document Height and Actual Weight   If O2 sat   ED Cardiac monitoring   Inpatient consult to Cardiology   Consult for [facility] Admission   ED EKG   ECHOCARDIOGRAM COMPLETE     OTHER Significant initial  Findings:  labs showing:     DM  labs:  HbA1C: No results for input(s): "HGBA1C" in the last 8760 hours.     CBG (last 3)  No results for input(s): "GLUCAP" in the last 72 hours.        Cultures: No results found for: "SDES", "SPECREQUEST", "CULT", "REPTSTATUS"   Radiological Exams on Admission: DG Chest Port 1 View Result Date: 04/15/2024 EXAM: 1 VIEW(S) XRAY OF THE CHEST 04/15/2024 07:39:00 PM COMPARISON: 04/12/2024 CLINICAL HISTORY: 5107 Atrial fibrillation (HCC) 5107. Reports increased SOB and leg swelling. Saw pulmonology today and cardiology. Patient hx of Afib and was here approx 2 months ago for same. Patient sent by cardiology for Afib RVR 120-150s. FINDINGS: LUNGS AND PLEURA: Mild bibasilar scarring/atelectasis, unchanged. No consolidation. No pulmonary edema. No pleural effusion. No pneumothorax. HEART AND MEDIASTINUM: No acute abnormality of the cardiac and mediastinal silhouettes. BONES AND SOFT TISSUES: No acute osseous abnormality. IMPRESSION: 1. No acute cardiopulmonary pathology. Electronically signed by: Zadie Herter MD 04/15/2024 08:04 PM EDT RP Workstation: [id number]    _______________________________________________________________________________________________________ Latest  Blood pressure 126/84, pulse (!) 125, temperature 98.4 F (36.9 C), resp. rate 20, height 5\' 9"  (1.753 m), weight 98 kg, SpO2 98%.   Vitals  labs and radiology finding personally reviewed  Review of Systems:    Pertinent positives  include:   fatigue, weight loss  shortness of breath at rest.  dyspnea on exertion, Constitutional:  No weight loss, night sweats, Fevers, chills,  HEENT:  No headaches, Difficulty swallowing,Tooth/dental problems,Sore throat,  No sneezing, itching, ear ache, nasal congestion, post nasal drip,  Cardio-vascular:  No chest pain, Orthopnea, PND, anasarca, dizziness, palpitations.no Bilateral lower extremity swelling  GI:  No heartburn, indigestion, abdominal pain, nausea, vomiting, diarrhea, change in bowel habits, loss of appetite, melena, blood in stool, hematemesis Resp:  no No excess mucus, no productive cough, No non-productive cough, No coughing up of blood.No change in color of mucus.No wheezing. Skin:  no rash or lesions. No jaundice GU:  no dysuria, change in color of urine, no urgency or frequency. No straining to urinate.  No flank pain.  Musculoskeletal:  No joint pain or no joint swelling. No decreased range of motion. No back pain.  Psych:  No change in mood or affect. No depression or anxiety. No memory loss.  Neuro: no localizing neurological complaints, no tingling, no weakness, no double vision, no gait abnormality, no slurred speech, no confusion  All systems reviewed and apart from HOPI all are negative _______________________________________________________________________________________________ Past Medical History:   Past Medical History:  Diagnosis Date   Asthma    Atrial fibrillation (HCC)    Biliary colic 2014   Bronchiectasis (HCC)    2021   COPD (chronic obstructive pulmonary disease) with chronic bronchitis  (HCC)    Coronary artery calcification seen on CAT scan    Cyst of pancreas    1 cm (7/18), repeat ct in 12 months.     Hyperlipidemia    Hypertension    Obstructive sleep apnea    Renal disorder    Rhinitis    Sleep apnea    pt denies sleep apnea-lost weight      Past Surgical History:  Procedure Laterality Date   COLONOSCOPY  09/29/2014   INCISION AND DRAINAGE OF WOUND Left 03/13/2024   Procedure: IRRIGATION AND DEBRIDEMENT WOUND;  Surgeon: Arvil Birks, MD;  Location: [facility] OR;  Service: Orthopedics;  Laterality: Left;  IRRIGATION AND DEBRIDEMENT FIFTH FINGER LEFT   LAPAROSCOPIC CHOLECYSTECTOMY SINGLE SITE WITH INTRAOPERATIVE CHOLANGIOGRAM N/A 09/15/2019   Procedure: LAPAROSCOPIC CHOLECYSTECTOMY SINGLE SITE;  Surgeon: Candyce Champagne, MD;  Location: [facility] ORS;  Service: General;  Laterality: N/A;   LITHOTRIPSY  2000   for kidney stone    Social History:  Ambulatory  cane, walker        reports that he quit smoking about 49 years ago. His smoking use included cigarettes. He started smoking about 60 years ago. He has a 22 pack-year smoking history. He has never used smokeless tobacco. He reports that he does not currently use alcohol after a past usage of about 1.0 standard drink of alcohol per week. He reports that he does not use drugs.   Family History:   Family History  Problem Relation Age of Onset   Rheumatic fever Mother    Stroke Father    Atrial fibrillation Father    Colon polyps Father    Arthritis Sister    Colon cancer Neg Hx    Esophageal cancer Neg Hx    Rectal cancer Neg Hx    Stomach cancer Neg Hx    ______________________________________________________________________________________________ Allergies: Allergies  Allergen Reactions   Dust Mite Extract Other (See Comments)     Prior to Admission medications   Medication Sig Start Date End Date Taking? Authorizing Provider  acetaminophen  (TYLENOL ) 500  MG tablet Take 1,000 mg by mouth every 6 (six) hours  as needed for mild pain (pain score 1-3).   Yes [provider]  apixaban  (ELIQUIS ) 5 MG TABS tablet TAKE 1 TABLET BY MOUTH TWICE A DAY 02/26/24  Yes Avanell Leigh, MD  Ascorbic Acid (VITAMIN C) 1000 MG tablet Take 1,000 mg by mouth 2 (two) times daily. 11/18/20  Yes [provider]  benzonatate (TESSALON) 200 MG capsule Take 1 capsule (200 mg total) by mouth 3 (three) times daily as needed for cough. 04/12/24  Yes Cobb, Mariah Shines, NP  budesonide -formoterol  (SYMBICORT ) 160-4.5 MCG/ACT inhaler Inhale 2 puffs into the lungs in the morning and at bedtime. Generic medication 12/22/23  Yes Austine Lefort, MD  Cholecalciferol (VITAMIN D3) 125 MCG (5000 UT) TABS Take 1 tablet by mouth 2 (two) times daily.   Yes [provider]  Coenzyme Q10 (CO Q-10) 100 MG CAPS Take 1 capsule by mouth daily.   Yes [provider]  doxycycline  (VIBRAMYCIN ) 100 MG capsule Take 100 mg by mouth 2 (two) times daily.   Yes [provider]  FIBER COMPLETE PO Take 1 tablet by mouth daily.   Yes [provider]  furosemide  (LASIX ) 40 MG tablet TAKE 1 TABLET (40 MG TOTAL) BY MOUTH DAILY. STOP HCTZ 02/08/24  Yes Austine Lefort, MD  levalbuterol  (XOPENEX ) 0.63 MG/3ML nebulizer solution Take 3 mLs (0.63 mg total) by nebulization every 4 (four) hours as needed for wheezing or shortness of breath. 02/18/24 02/17/25 Yes [place], Eric J, DO  metoprolol  (TOPROL -XL) 200 MG 24 hr tablet Take 200 mg by mouth daily.   Yes [provider]  montelukast  (SINGULAIR ) 10 MG tablet Take 1 tablet (10 mg total) by mouth daily. 09/11/23  Yes Austine Lefort, MD  pantoprazole  (PROTONIX ) 40 MG tablet Take 1 tablet (40 mg total) by mouth 2 (two) times daily. 09/11/23  Yes Austine Lefort, MD  predniSONE  (DELTASONE ) 10 MG tablet 4 tabs for 3 days, then 3 tabs for 3 days, 2 tabs for 3 days, then 1 tab for 3 days, then stop 04/12/24  Yes Cobb, Mariah Shines, NP  rosuvastatin  (CRESTOR ) 20 MG tablet  TAKE 1 TABLET BY MOUTH EVERY DAY **D/C LIPITOR** Patient taking differently: Take 20 mg by mouth every evening. 02/08/24  Yes Austine Lefort, MD  metoprolol  succinate (TOPROL -XL) 100 MG 24 hr tablet Take 2 tablets (200 mg total) by mouth daily. Take with or immediately following a meal. Patient not taking: Reported on 04/15/2024 03/01/24   Austine Lefort, MD  Respiratory Therapy Supplies (FLUTTER) DEVI Use as directed 07/05/18   Diamond Formica, MD    ___________________________________________________________________________________________________ Physical Exam:    04/15/2024    8:50 PM 04/15/2024    8:21 PM 04/15/2024    7:38 PM  Vitals with BMI  Systolic   126  Diastolic   84  Pulse 125 88 102     1. General:  in No  Acute distress   Chronically ill   -appearing 2. Psychological: Alert and   Oriented 3. Head/ENT:    Dry Mucous Membranes                          Head Non traumatic, neck supple                           Poor Dentition 4. SKIN:  decreased Skin turgor,  Skin clean Dry and intact no rash    5. Heart: Regular rate and rhythm no  Murmur, no Rub or gallop 6. Lungs: bilateral  wheezes some  crackles   7. Abdomen: Soft,  non-tender, Non distended   obese  bowel sounds present 8. Lower extremities: no clubbing, cyanosis,  trace edema 9. Neurologically Grossly intact, moving all 4 extremities equally   10. MSK: Normal range of motion    Chart has been reviewed  ______________________________________________________________________________________________  Assessment/Plan  78 y.o. male with medical history significant of A-fib with RVR on Eliquis , CAD, hypertension hyperlipidemia, COPD and bronchiectasis   Admitted for   Longstanding persistent atrial fibrillation  w rvr    COPD exacerbation  Present on Admission:  Atrial fibrillation with RVR (HCC)  Atrial fibrillation with rapid ventricular response (HCC)  Coronary artery calcification seen on CAT scan   Essential hypertension  Hyperlipidemia  OSA (obstructive sleep apnea)  COPD with acute exacerbation (HCC)  AKI (acute kidney injury) (HCC)     Atrial fibrillation with rapid ventricular response (HCC) Case has been discussed with cardiology by ER provider.  They recommended giving a dose of metoprolol  which seemed to have helped.  At least temporarily.  Heart rate fluctuating between mid 80s to 100 teens.  Patient is asymptomatic Given soft blood pressure would avoid over controlling heart rate Would avoid albuterol   Cardiology will see in consult in a.m. Repeat echogram.  Continue to cycle cardiac enzymes Continue Eliquis  defer to cardiology regarding long-term management   Bronchiectasis without complication (HCC) Chronic likely contributing to dyspnea on exertion. Patient currently not on new oxygen Pulmonology sees him as an outpatient.  Consider switching to bisoprolol.  Once A-fib is under better control Xopenex  as needed Continue prednisone  continue doxycycline   Coronary artery calcification seen on CAT scan Chronic stable patient not on aspirin  continue statin Crestor   Essential hypertension Patient given a dose of Lasix  in the ER Monitor BP.  For tonight continue Toprol  but will need to discuss with cardiology long standing management  Hyperlipidemia Continue Crestor  20 mg a day  OSA (obstructive sleep apnea) No longer uses CPAP patient declined  COPD with acute exacerbation (HCC) Given wheezing COPD exacerbation likely playing a role we will continue doxycycline  and prednisone .  Xopenex  as needed to avoid tachycardia Order respiratory panel patient have had some sick contacts as well as some GI complaints  AKI (acute kidney injury) (HCC) Hard to say if Prerenal versus cardio renal Patient did have some episodes of nausea vomiting diarrhea yesterday. In the emergency department was given a dose of Lasix  With some urine output Given currently soft blood pressures  and in the light of recent GI complaints we will hold off on further aggressive diuresis.  Last echo showed preserved EF.  Will repeat   Other plan as per orders.  DVT prophylaxis: Eliquis    Code Status:    Code Status: Prior FULL CODE as per patient   I had personally discussed CODE STATUS with patient   ACP   none   Family Communication:   Family not at  Bedside    Diet heart healthy   Disposition Plan:      To home once workup is complete and patient is stable   Following barriers for discharge:  Electrolytes corrected                                                        Pain controlled with PO medications                                                           Will need consultants to evaluate patient prior to discharge                           Work of breathing improves                         Would benefit from PT/OT eval prior to DC  [employer] called: cardiology are aware   Admission status:  ED Disposition     ED Disposition  Admit   Condition  --   Comment  Hospital Area: [facility] [[id number]]  Level of Care: Progressive [102]  Admit to Progressive based on following criteria: CARDIOVASCULAR & THORACIC of moderate stability with acute coronary syndrome symptoms/low risk myocardial infarction/hypertensive urgency/arrhythmias/heart failure potentially compromising stability and stable post cardiovascular intervention patients.  May place patient in observation at [facility] or [facility] if equivalent level of care is available:: No  Covid Evaluation: Asymptomatic - no recent exposure (last 10 days) testing not required  Diagnosis: Atrial fibrillation with RVR [facility]) [[id number]]  Admitting Physician: Ritesh Opara [[id number]]  Attending Physician: Indira Sorenson [[id number]]           Obs      Level of care    progressive           Selene Dais 04/15/2024, 11:39 PM    [facility]     after 2 AM please page floor coverage   If 7AM-7PM, please contact the day team taking care of the patient using [email or web address]

## 2024-04-15 NOTE — Assessment & Plan Note (Signed)
 Continue Crestor 20 mg a day

## 2024-04-16 ENCOUNTER — Observation Stay (HOSPITAL_COMMUNITY)

## 2024-04-16 ENCOUNTER — Other Ambulatory Visit: Payer: Self-pay

## 2024-04-16 DIAGNOSIS — I5031 Acute diastolic (congestive) heart failure: Secondary | ICD-10-CM

## 2024-04-16 DIAGNOSIS — R0602 Shortness of breath: Secondary | ICD-10-CM | POA: Diagnosis not present

## 2024-04-16 DIAGNOSIS — I4891 Unspecified atrial fibrillation: Secondary | ICD-10-CM | POA: Diagnosis not present

## 2024-04-16 DIAGNOSIS — I10 Essential (primary) hypertension: Secondary | ICD-10-CM | POA: Diagnosis not present

## 2024-04-16 DIAGNOSIS — I2510 Atherosclerotic heart disease of native coronary artery without angina pectoris: Secondary | ICD-10-CM | POA: Diagnosis not present

## 2024-04-16 LAB — RESPIRATORY PANEL BY PCR

## 2024-04-16 LAB — CBC
HCT: 38.6 % — ABNORMAL LOW (ref 39.0–52.0)
Hemoglobin: 12 g/dL — ABNORMAL LOW (ref 13.0–17.0)
MCH: 28.1 pg (ref 26.0–34.0)
MCHC: 31.1 g/dL (ref 30.0–36.0)
MCV: 90.4 fL (ref 80.0–100.0)
Platelets: 304 10*3/uL (ref 150–400)
RBC: 4.27 MIL/uL (ref 4.22–5.81)
RDW: 17.5 % — ABNORMAL HIGH (ref 11.5–15.5)
WBC: 8.5 10*3/uL (ref 4.0–10.5)
nRBC: 0 % (ref 0.0–0.2)

## 2024-04-16 LAB — COMPREHENSIVE METABOLIC PANEL WITH GFR
ALT: 34 U/L (ref 0–44)
AST: 25 U/L (ref 15–41)
Albumin: 2.5 g/dL — ABNORMAL LOW (ref 3.5–5.0)
Alkaline Phosphatase: 43 U/L (ref 38–126)
Anion gap: 10 (ref 5–15)
BUN: 25 mg/dL — ABNORMAL HIGH (ref 8–23)
CO2: 26 mmol/L (ref 22–32)
Calcium: 9.6 mg/dL (ref 8.9–10.3)
Chloride: 104 mmol/L (ref 98–111)
Creatinine, Ser: 1.03 mg/dL (ref 0.61–1.24)
GFR, Estimated: >60 mL/min (ref >60)
Glucose, Bld: 98 mg/dL (ref 70–99)
Potassium: 3.8 mmol/L (ref 3.5–5.1)
Sodium: 140 mmol/L (ref 135–145)
Total Bilirubin: 0.9 mg/dL (ref 0.0–1.2)
Total Protein: 5.8 g/dL — ABNORMAL LOW (ref 6.5–8.1)

## 2024-04-16 LAB — PREALBUMIN: Prealbumin: 17 mg/dL — ABNORMAL LOW (ref 18–38)

## 2024-04-16 LAB — HEPATIC FUNCTION PANEL
ALT: 38 U/L (ref 0–44)
AST: 36 U/L (ref 15–41)
Albumin: 2.8 g/dL — ABNORMAL LOW (ref 3.5–5.0)
Alkaline Phosphatase: 51 U/L (ref 38–126)
Bilirubin, Direct: 0.1 mg/dL (ref 0.0–0.2)
Indirect Bilirubin: 0.6 mg/dL (ref 0.3–0.9)
Total Bilirubin: 0.7 mg/dL (ref 0.0–1.2)
Total Protein: 6.6 g/dL (ref 6.5–8.1)

## 2024-04-16 LAB — RESP PANEL BY RT-PCR (RSV, FLU A&B, COVID)  RVPGX2
Influenza A by PCR: NEGATIVE
Influenza B by PCR: NEGATIVE
Resp Syncytial Virus by PCR: NEGATIVE
SARS Coronavirus 2 by RT PCR: NEGATIVE

## 2024-04-16 LAB — PHOSPHORUS
Phosphorus: 5.5 mg/dL — ABNORMAL HIGH (ref 2.5–4.6)
Phosphorus: 4.7 mg/dL — ABNORMAL HIGH (ref 2.5–4.6)

## 2024-04-16 LAB — OSMOLALITY, URINE: Osmolality, Ur: 336 mosm/kg (ref 300–900)

## 2024-04-16 LAB — BASIC METABOLIC PANEL WITH GFR
Anion gap: 11 (ref 5–15)
BUN: 28 mg/dL — ABNORMAL HIGH (ref 8–23)
CO2: 22 mmol/L (ref 22–32)
Calcium: 9.8 mg/dL (ref 8.9–10.3)
Chloride: 105 mmol/L (ref 98–111)
Creatinine, Ser: 1.17 mg/dL (ref 0.61–1.24)
GFR, Estimated: >60 mL/min (ref >60)
Glucose, Bld: 119 mg/dL — ABNORMAL HIGH (ref 70–99)
Potassium: 3.9 mmol/L (ref 3.5–5.1)
Sodium: 138 mmol/L (ref 135–145)

## 2024-04-16 LAB — ECHOCARDIOGRAM COMPLETE
Height: 69 in
S' Lateral: 2.9 cm
Weight: 3456 [oz_av]

## 2024-04-16 LAB — PROCALCITONIN: Procalcitonin: <0.1 ng/mL

## 2024-04-16 LAB — MAGNESIUM
Magnesium: 1.7 mg/dL (ref 1.7–2.4)
Magnesium: 1.7 mg/dL (ref 1.7–2.4)

## 2024-04-16 LAB — TROPONIN I (HIGH SENSITIVITY): Troponin I (High Sensitivity): 7 ng/L (ref <18)

## 2024-04-16 LAB — OSMOLALITY: Osmolality: 309 mosm/kg — ABNORMAL HIGH (ref 275–295)

## 2024-04-16 MED ORDER — LEVALBUTEROL HCL 0.63 MG/3ML IN NEBU: 0.6300 mg | INHALATION_SOLUTION | RESPIRATORY_TRACT | Status: DC | PRN

## 2024-04-16 MED ORDER — FLUTICASONE FUROATE-VILANTEROL 200-25 MCG/ACT IN AEPB
1.0000 | INHALATION_SPRAY | Freq: Every day | RESPIRATORY_TRACT | Status: DC
  Filled 2024-04-16: qty 28

## 2024-04-16 MED ORDER — APIXABAN 5 MG PO TABS
5.0000 mg | ORAL_TABLET | Freq: Two times a day (BID) | ORAL | Status: DC
  Administered 2024-04-16 – 2024-04-18 (×5): 5 mg via ORAL
  Filled 2024-04-16 (×6): qty 1

## 2024-04-16 MED ORDER — PANTOPRAZOLE SODIUM 40 MG PO TBEC
40.0000 mg | DELAYED_RELEASE_TABLET | Freq: Two times a day (BID) | ORAL | Status: DC
  Administered 2024-04-16 – 2024-04-18 (×5): 40 mg via ORAL
  Filled 2024-04-16 (×6): qty 1

## 2024-04-16 MED ORDER — DOXYCYCLINE HYCLATE 100 MG PO TABS
100.0000 mg | ORAL_TABLET | Freq: Two times a day (BID) | ORAL | Status: DC
  Administered 2024-04-16 – 2024-04-17 (×3): 100 mg via ORAL
  Filled 2024-04-16 (×3): qty 1

## 2024-04-16 MED ORDER — ROSUVASTATIN CALCIUM 20 MG PO TABS
20.0000 mg | ORAL_TABLET | Freq: Every evening | ORAL | Status: DC
  Administered 2024-04-16 – 2024-04-17 (×2): 20 mg via ORAL
  Filled 2024-04-16 (×2): qty 1

## 2024-04-16 MED ORDER — ACETAMINOPHEN 650 MG RE SUPP: 650.0000 mg | Freq: Four times a day (QID) | RECTAL | Status: DC | PRN

## 2024-04-16 MED ORDER — DILTIAZEM HCL 30 MG PO TABS
30.0000 mg | ORAL_TABLET | Freq: Four times a day (QID) | ORAL | Status: DC
  Administered 2024-04-16 – 2024-04-17 (×3): 30 mg via ORAL
  Filled 2024-04-16 (×5): qty 1

## 2024-04-16 MED ORDER — ONDANSETRON HCL 4 MG PO TABS: 4.0000 mg | ORAL_TABLET | Freq: Four times a day (QID) | ORAL | Status: DC | PRN

## 2024-04-16 MED ORDER — GUAIFENESIN ER 600 MG PO TB12
600.0000 mg | ORAL_TABLET | Freq: Two times a day (BID) | ORAL | Status: DC
  Administered 2024-04-16 – 2024-04-18 (×6): 600 mg via ORAL
  Filled 2024-04-16 (×6): qty 1

## 2024-04-16 MED ORDER — ARFORMOTEROL TARTRATE 15 MCG/2ML IN NEBU
15.0000 ug | INHALATION_SOLUTION | Freq: Two times a day (BID) | RESPIRATORY_TRACT | Status: DC
  Administered 2024-04-16 – 2024-04-18 (×4): 15 ug via RESPIRATORY_TRACT
  Filled 2024-04-16 (×4): qty 2

## 2024-04-16 MED ORDER — MONTELUKAST SODIUM 10 MG PO TABS
10.0000 mg | ORAL_TABLET | Freq: Every day | ORAL | Status: DC
  Administered 2024-04-16 – 2024-04-18 (×3): 10 mg via ORAL
  Filled 2024-04-16 (×3): qty 1

## 2024-04-16 MED ORDER — ACETAMINOPHEN 325 MG PO TABS
650.0000 mg | ORAL_TABLET | Freq: Four times a day (QID) | ORAL | Status: DC | PRN
  Administered 2024-04-17: 650 mg via ORAL
  Filled 2024-04-16: qty 2

## 2024-04-16 MED ORDER — FUROSEMIDE 40 MG PO TABS
40.0000 mg | ORAL_TABLET | Freq: Every day | ORAL | Status: DC
  Administered 2024-04-16: 40 mg via ORAL
  Filled 2024-04-16: qty 2

## 2024-04-16 MED ORDER — ONDANSETRON HCL 4 MG/2ML IJ SOLN: 4.0000 mg | Freq: Four times a day (QID) | INTRAMUSCULAR | Status: DC | PRN

## 2024-04-16 MED ORDER — BUDESONIDE 0.5 MG/2ML IN SUSP
0.5000 mg | Freq: Two times a day (BID) | RESPIRATORY_TRACT | Status: DC
  Administered 2024-04-16 – 2024-04-18 (×4): 0.5 mg via RESPIRATORY_TRACT
  Filled 2024-04-16 (×4): qty 2

## 2024-04-16 MED ORDER — HYDROCODONE-ACETAMINOPHEN 5-325 MG PO TABS: 1.0000 | ORAL_TABLET | ORAL | Status: DC | PRN

## 2024-04-16 MED ORDER — METOPROLOL SUCCINATE ER 100 MG PO TB24
200.0000 mg | ORAL_TABLET | Freq: Every day | ORAL | Status: DC
  Administered 2024-04-16 – 2024-04-18 (×3): 200 mg via ORAL
  Filled 2024-04-16: qty 2
  Filled 2024-04-16: qty 8
  Filled 2024-04-16: qty 2

## 2024-04-16 MED ORDER — PREDNISONE 20 MG PO TABS
40.0000 mg | ORAL_TABLET | Freq: Every day | ORAL | Status: DC
  Administered 2024-04-16 – 2024-04-18 (×3): 40 mg via ORAL
  Filled 2024-04-16 (×3): qty 2

## 2024-04-16 NOTE — Progress Notes (Signed)
 Progress Note  Patient Name: Mario [name] Date of Encounter: 04/16/2024  [facility] Cardiologist: Lauro Portal, MD   Patient Profile     Subjective   Still short of breath but improved.  Denies any chest pain.  Remains in atrial fibrillation with RVR heart rate in the 115 to 120 bpm range on telemetry  Inpatient Medications    Scheduled Meds:  apixaban   5 mg Oral BID   doxycycline   100 mg Oral BID   fluticasone  furoate-vilanterol  1 puff Inhalation Daily   guaiFENesin   600 mg Oral BID   metoprolol   200 mg Oral Daily   montelukast   10 mg Oral Daily   pantoprazole   40 mg Oral BID   predniSONE   40 mg Oral Q breakfast   rosuvastatin   20 mg Oral QPM   Continuous Infusions:  PRN Meds: acetaminophen  **OR** acetaminophen , HYDROcodone-acetaminophen , levalbuterol , metoprolol  tartrate, ondansetron  **OR** ondansetron  (ZOFRAN ) IV   Vital Signs    Vitals:   04/16/24 0100 04/16/24 0400 04/16/24 0600 04/16/24 0700  BP: 126/86 (!) 127/95 (!) 133/107 (!) 147/91  Pulse: 63 92 100 96  Resp: 16 13 18 17   Temp: 98.4 F (36.9 C)  97.7 F (36.5 C) 97.8 F (36.6 C)  TempSrc:    Oral  SpO2: 97% 97% 96% 96%  Weight:      Height:        Intake/Output Summary (Last 24 hours) at 04/16/2024 9528 Last data filed at 04/16/2024 0000 Gross per 24 hour  Intake --  Output 850 ml  Net -850 ml      04/15/2024    6:45 PM 04/15/2024    3:38 PM 04/12/2024    9:56 AM  Last 3 Weights  Weight (lbs) 216 lb 216 lb 9.6 oz 217 lb 6.4 oz  Weight (kg) 97.977 kg 98.249 kg 98.612 kg      Telemetry    Atrial fibrillation with RVR- Personally Reviewed  ECG    Fibrillation with heart rate 97 bpm, low voltage in precordial leads- Personally Reviewed  Physical Exam   GEN: No acute distress.   Neck: No JVD Cardiac: irregularly irregular and tachycardic, no murmurs, rubs, or gallops.  Respiratory: Diffuse scattered wheezes at bases bilaterally GI: Soft, nontender, non-distended  MS: No  edema; No deformity. Neuro:  Nonfocal  Psych: Normal affect   Labs    High Sensitivity Troponin:   Recent Labs  Lab 04/15/24 1854 04/15/24 2051 04/15/24 2300  TROPONINIHS 6 7 7       Chemistry Recent Labs  Lab 04/15/24 1854 04/15/24 2300 04/15/24 2321 04/16/24 0621  NA 140 138 140 140  K 5.2* 3.9 4.0 3.8  CL 107 105  --  104  CO2 20* 22  --  26  GLUCOSE 140* 119*  --  98  BUN 27* 28*  --  25*  CREATININE 1.33* 1.17  --  1.03  CALCIUM  10.2 9.8  --  9.6  PROT  --  6.6  --  5.8*  ALBUMIN  --  2.8*  --  2.5*  AST  --  36  --  25  ALT  --  38  --  34  ALKPHOS  --  51  --  43  BILITOT  --  0.7  --  0.9  GFRNONAA 54* >60  --  >60  ANIONGAP 13 11  --  10     Hematology Recent Labs  Lab 04/15/24 1854 04/15/24 2321 04/16/24 0621  WBC  7.0  --  8.5  RBC 4.63  --  4.27  HGB 13.0 12.6* 12.0*  HCT 43.1 37.0* 38.6*  MCV 93.1  --  90.4  MCH 28.1  --  28.1  MCHC 30.2  --  31.1  RDW 17.3*  --  17.5*  PLT 368  --  304    BNP Recent Labs  Lab 04/15/24 1854  BNP 644.3*     DDimer No results for input(s): "DDIMER" in the last 168 hours.   CHA2DS2-VASc Score = 4   This indicates a 4.8% annual risk of stroke. The patient's score is based upon: CHF History: 1 HTN History: 1 Diabetes History: 0 Stroke History: 0 Vascular Disease History: 0 Age Score: 2 Gender Score: 0   Radiology    DG Chest Port 1 View Result Date: 04/15/2024 EXAM: 1 VIEW(S) XRAY OF THE CHEST 04/15/2024 07:39:00 PM COMPARISON: 04/12/2024 CLINICAL HISTORY: 5107 Atrial fibrillation (HCC) 5107. Reports increased SOB and leg swelling. Saw pulmonology today and cardiology. Patient hx of Afib and was here approx 2 months ago for same. Patient sent by cardiology for Afib RVR 120-150s. FINDINGS: LUNGS AND PLEURA: Mild bibasilar scarring/atelectasis, unchanged. No consolidation. No pulmonary edema. No pleural effusion. No pneumothorax. HEART AND MEDIASTINUM: No acute abnormality of the cardiac and  mediastinal silhouettes. BONES AND SOFT TISSUES: No acute osseous abnormality. IMPRESSION: 1. No acute cardiopulmonary pathology. Electronically signed by: Zadie Herter MD 04/15/2024 08:04 PM EDT RP Workstation: [id number]    Patient Profile     78 y.o. male with a history of three vessel coronary artery calcifications noted on prior chest CTs dating back to at least 2019 but low risk Myoview  in 04/2019, persistent atrial fibrillation on Eliquis , hypertension, hyperlipidemia, obstructive sleep apnea on CPAP, bronchiectasis, and obesity admitted from the office with A-fib with RVR and shortness of breath.  Assessment & Plan    Atrial Fibrillation with RVR Patient has history of persistent atrial fibrillation with last documented episode of sinus rhythm was in 12/2022.   He was recently admitted at the end of 01/2024 for rapid atrial fibrillation and home metoprolol  was significantly increased.   Patient has a history of COPD/ asthma and bronchiectasis and has had recurrent issues for COPD/ asthma exacerbation. He is currently on antibiotics and steroids.  Presented to office with complaints of fatigue and shortness of breath and found to be in A-fib with RVR  Continue Toprol  XL 200 mg daily Continue Eliquis  5mg  twice daily>> he does not think he has missed any doses in the past 3 weeks but he says he is not sure about that he was off DOAC in March for hand surgery Suspect that rapid heart rate related to COPD exacerbation as well as bronchodilators and steroid use  RVR may be secondary to COPD exacerbation with steroid use and increased use of bronchodilators. He is not sure if he has missed a dose of Eliquis  in the last 3 weeks.  Would hold off on TEE/DCCV for now given active COPD exacerbation with wheezing.  Plan for rate control for now and consider later TEE/DCCV once his COPD exacerbation has improved Check 2D echo to make sure LV function still remains normal For now start Cardizem  30 mg  every 6 hours for better heart rate control   Possible Acute Diastolic CHF Shortness of breath Patient reports significant dyspnea with minimal exertion found to have crackles and lower extremity edema He reports his weight is down 30lbs since last admission 2 months. Chest  x-ray last week showed minimal bibasilar subsegmental atelectasis or scarring with minimal left pleural effusion.  Weight loss will need further workup Is not been compliant with his Lasix  that he supposed to be on 40 mg daily Echo in 10/2023 showed LVEF of 50-55% with normal wall motion. TSH normal this admission BNP mildly elevated at 644>> setting of normal chest x-ray specked BNP is primarily elevated due to acute COPD exacerbation X-ray with no edema 1 dose of Lasix  40 mg IV yesterday put out 850 cc Serum creatinine stable at 1.03 and potassium 3.8 today Supposed to be on Lasix  40 mg daily at home but has not been compliant >>will restart   Coronary Artery Calcifications 3-vessel coronary artery calcifications have been noted on prior chest CTs dating back to at least 2019. Myoview  in 04/2019 was low risk.  Cardiac PET stress test was ordered at last visit to rule out dyspnea as an anginal equivalent. This is scheduled for 06/12/2023. Denies any chest pain No Aspirin  due to DOAC Continue Crestor  20 mg daily and XL 200 mg daily  Hypertension BP borderline controlled on exam  Continue Toprol  XL 200 mg daily  Adding Cardizem  30 mg every 6 hours for better heart rate control which should help with blood pressure   Hyperlipidemia - Continue Crestor  20mg  daily.    Obstructive Sleep Apnea - Continue CPAP.    I spent 35 minutes caring for this patient today face to face, ordering and reviewing labs, reviewing records from 2D echo  2024 , seeing the patient, documenting in the record, and arranging for a repeat 2D echo   For questions or updates, please contact [facility] Please consult [email or web address] for  contact info under        Signed, Gaylyn Keas, MD  04/16/2024, 9:22 AM

## 2024-04-16 NOTE — ED Notes (Signed)
Pt remains in vascular.

## 2024-04-16 NOTE — ED Notes (Signed)
 Pt to vascular via stretcher. RR even and unlabored.

## 2024-04-16 NOTE — Evaluation (Signed)
 Physical Therapy Evaluation Patient Details Name: Mario [name] MRN: [id number] DOB: 12/30/1945 Today's Date: 04/16/2024  History of Present Illness  Patient is a 78 y/o male admitted 04/15/24 with SOB and wheezing.  Found to be in A-fib w/RVR and with COPD exacerbation.  PMH positive for A-fib w/RVR on Eliquis , CAD, HTN, HLD, COPD and bronchiectasis.  Clinical Impression  Patient presents with decreased mobility though reporting better than recent baseline.  States was very limited with distance due to SOB and today able to walk around yellow pod in ED.  He was on O2 in the room though removed to check for need and SpO2 at rest remained at 95%.  With ambulation patient on RA remained at 93%.  Feels improved though at recent baseline prior to March he was negotiating steps to his bedroom and able to tolerate though not able to go upstairs past 2 weeks.  Feel he will benefit from skilled PT in the acute setting prior to d/c home to ensure safety on stairs with VSS.  Likely will not need follow up PT at d/c.         If plan is discharge home, recommend the following: Help with stairs or ramp for entrance;Assistance with cooking/housework   Can travel by private vehicle        Equipment Recommendations None recommended by PT  Recommendations for Other Services       Functional Status Assessment Patient has had a recent decline in their functional status and demonstrates the ability to make significant improvements in function in a reasonable and predictable amount of time.     Precautions / Restrictions Precautions Precautions: Fall Precaution/Restrictions Comments: watch O2      Mobility  Bed Mobility Overal bed mobility: Modified Independent             General bed mobility comments: some assist with lines    Transfers Overall transfer level: Needs assistance Equipment used: Rolling walker (2 wheels) Transfers: Sit to/from Stand Sit to Stand: Supervision            General transfer comment: assist for safety and lines    Ambulation/Gait Ambulation/Gait assistance: Contact guard assist Gait Distance (Feet): 140 Feet Assistive device: Rolling walker (2 wheels) Gait Pattern/deviations: Step-through pattern, Decreased stride length       General Gait Details: stopped x 2 to check SpO2 on RA with reading 93-95% and pt reports much improved from prior to admission  Stairs            Wheelchair Mobility     Tilt Bed    Modified Rankin (Stroke Patients Only)       Balance Overall balance assessment: Needs assistance   Sitting balance-Leahy Scale: Good       Standing balance-Leahy Scale: Fair Standing balance comment: can stand static without RW using for ambulation                             Pertinent Vitals/Pain Pain Assessment Pain Assessment: Faces Faces Pain Scale: Hurts little more Pain Location: L hand little finger, had infection then I&D in March Pain Descriptors / Indicators: Discomfort, Sore, Tender Pain Intervention(s): Monitored during session    Home Living Family/patient expects to be discharged to:: Private residence Living Arrangements: Spouse/significant other;Other relatives Engineer, building services) Available Help at Discharge: Family Type of Home: House       Alternate Level Stairs-Number of Steps: has not been able to go upstairs for  a little while (since March), sleeping in recliner, has 15 steps Home Layout: Two level Home Equipment: Agricultural consultant (2 wheels);Cane - single point;Grab bars - tub/shower;Hand held shower head      Prior Function Prior Level of Function : Independent/Modified Independent             Mobility Comments: no falls in past 6 months       Extremity/Trunk Assessment   Upper Extremity Assessment Upper Extremity Assessment: RUE deficits/detail RUE Deficits / Details: limited shoulder elevation with RCT; otherwise [facility]    Lower Extremity Assessment Lower  Extremity Assessment: RLE deficits/detail;LLE deficits/detail RLE Deficits / Details: some pitting edema present, reports worse in ankles than normal, numbness in feet, strength & ROM overall WFL LLE Deficits / Details: some pitting edema present, reports worse in ankles than normal, numbness in feet, strength & ROM overall WFL    Cervical / Trunk Assessment Cervical / Trunk Assessment: Kyphotic  Communication   Communication Communication: No apparent difficulties    Cognition Arousal: Alert Behavior During Therapy: WFL for tasks assessed/performed   PT - Cognitive impairments: Memory                       PT - Cognition Comments: trouble remembering timing of events Following commands: Intact       Cueing       General Comments General comments (skin integrity, edema, etc.): HR max 135 after ambulation though quickly back down to 110's while seated and reports no increased SOB    Exercises     Assessment/Plan    PT Assessment Patient needs continued PT services  PT Problem List Decreased mobility;Decreased activity tolerance;Decreased balance;Cardiopulmonary status limiting activity       PT Treatment Interventions DME instruction;Therapeutic exercise;Gait training;Balance training;Stair training;Functional mobility training;Therapeutic activities;Patient/family education    PT Goals (Current goals can be found in the Care Plan section)  Acute Rehab PT Goals Patient Stated Goal: improve activity tolerance PT Goal Formulation: With patient Time For Goal Achievement: 04/30/24 Potential to Achieve Goals: Good    Frequency Min 2X/week     Co-evaluation               AM-PAC PT "6 Clicks" Mobility  Outcome Measure Help needed turning from your back to your side while in a flat bed without using bedrails?: A Little (doesn't sleep in flat bed) Help needed moving from lying on your back to sitting on the side of a flat bed without using bedrails?: A  Little Help needed moving to and from a bed to a chair (including a wheelchair)?: A Little Help needed standing up from a chair using your arms (e.g., wheelchair or bedside chair)?: A Little Help needed to walk in hospital room?: A Little Help needed climbing 3-5 steps with a railing? : Total 6 Click Score: 16    End of Session Equipment Utilized During Treatment: Gait belt Activity Tolerance: Patient tolerated treatment well Patient left: in bed;with call bell/phone within reach   PT Visit Diagnosis: Other abnormalities of gait and mobility (R26.89)    Time: 1610-9604 PT Time Calculation (min) (ACUTE ONLY): 19 min   Charges:   PT Evaluation $PT Eval Low Complexity: 1 Low   PT General Charges $$ ACUTE PT VISIT: 1 Visit         Abigail Hoff, PT Acute Rehabilitation Services [phone] 04/16/2024   Marley Simmers 04/16/2024, 1:30 PM

## 2024-04-16 NOTE — Progress Notes (Signed)
  Progress Note   Patient: Mario [name]:[id number] DOB: December 08, 1946 DOA: 04/15/2024     0 DOS: the patient was seen and examined on 04/16/2024   Assessment and Plan: Afib w/ RVR  - Eliquis  5 mg PO bid  - Toprol  XL 200 mg PO daily  - Diltiazem  30 mg PO q6 hr  - Lasix  40 mg PO daily  - Appreciate cardiology following   COPD with acute exacerbation (in setting of bronchiectasis)  - Pulmicort /brovana bid  - Doxycyline 100 mg PO bid  - Mucinex  600 mg PO bid  - Xopenex  q4 hr PRN  - Singulair  10 mg PO daily  - Prednisone  40 mg PO daily  - Protonix  40 mg PO bid   CAD - Eliquis  as above  - Crestor  20 mg PO at bedtime   HTN  - Toprol  XL 200 mg PO daily  - Diltiazem  30 mg PO q6 hr   HLD  - Crestor  20 mg PO at bedtime   Subjective: Pt seen and examined at the bedside. Pt started on pulmicort /brovana bid. Appreciate cardiology adjusting the pt's medications. Continue inpt treatment of COPD and monitoring of the pt's HR.   Physical Exam: Vitals:   04/16/24 0600 04/16/24 0700 04/16/24 0900 04/16/24 1121  BP: (!) 133/107 (!) 147/91 123/87 (!) 142/94  Pulse: 100 96  (!) 108  Resp: 18 17 16  (!) 22  Temp: 97.7 F (36.5 C) 97.8 F (36.6 C)  98.2 F (36.8 C)  TempSrc:  Oral  Oral  SpO2: 96% 96%  97%  Weight:      Height:       Physical Exam HENT:     Head: Normocephalic.     Mouth/Throat:     Mouth: Mucous membranes are moist.  Cardiovascular:     Rate and Rhythm: Normal rate.  Pulmonary:     Breath sounds: Wheezing present.  Abdominal:     Palpations: Abdomen is soft.  Musculoskeletal:     Cervical back: Neck supple.  Skin:    General: Skin is warm.  Neurological:     Mental Status: He is alert. Mental status is at baseline.  Psychiatric:        Mood and Affect: Mood normal.      Disposition: Status is: Observation The patient remains OBS appropriate and will d/c before 2 midnights.  Planned Discharge Destination: Barriers to discharge: As above    Time  spent: 35 minutes  Author: Zaevion Parke , MD 04/16/2024 11:50 AM  For on call review [email or web address].

## 2024-04-16 NOTE — ED Notes (Signed)
Pt resting, no distress noted.

## 2024-04-16 NOTE — Progress Notes (Signed)
 SATURATION QUALIFICATIONS: (This note is used to comply with regulatory documentation for home oxygen)  Patient Saturations on Room Air at Rest = 96%  Patient Saturations on Room Air while Ambulating = 93%  Patient Saturations on NT Liters of oxygen while Ambulating = NT%  Please briefly explain why patient needs home oxygen: Mobilizing without O2 with SpO2 stable, no need for home O2 at this time.   Mario [name], PT Acute Rehabilitation Services Office:[phone] 04/16/2024

## 2024-04-16 NOTE — ED Notes (Signed)
 Assumed care of pt from [facility]

## 2024-04-17 ENCOUNTER — Other Ambulatory Visit (HOSPITAL_COMMUNITY): Payer: Self-pay

## 2024-04-17 ENCOUNTER — Telehealth (HOSPITAL_COMMUNITY): Payer: Self-pay | Admitting: Pharmacy Technician

## 2024-04-17 DIAGNOSIS — J45909 Unspecified asthma, uncomplicated: Secondary | ICD-10-CM | POA: Diagnosis not present

## 2024-04-17 DIAGNOSIS — E785 Hyperlipidemia, unspecified: Secondary | ICD-10-CM | POA: Diagnosis not present

## 2024-04-17 DIAGNOSIS — J479 Bronchiectasis, uncomplicated: Secondary | ICD-10-CM | POA: Diagnosis not present

## 2024-04-17 DIAGNOSIS — I4811 Longstanding persistent atrial fibrillation: Secondary | ICD-10-CM | POA: Diagnosis not present

## 2024-04-17 DIAGNOSIS — I4891 Unspecified atrial fibrillation: Secondary | ICD-10-CM | POA: Diagnosis not present

## 2024-04-17 DIAGNOSIS — I2510 Atherosclerotic heart disease of native coronary artery without angina pectoris: Secondary | ICD-10-CM | POA: Diagnosis not present

## 2024-04-17 DIAGNOSIS — Z91148 Patient's other noncompliance with medication regimen for other reason: Secondary | ICD-10-CM | POA: Diagnosis not present

## 2024-04-17 DIAGNOSIS — Z7951 Long term (current) use of inhaled steroids: Secondary | ICD-10-CM | POA: Diagnosis not present

## 2024-04-17 DIAGNOSIS — E66811 Obesity, class 1: Secondary | ICD-10-CM | POA: Diagnosis not present

## 2024-04-17 DIAGNOSIS — J449 Chronic obstructive pulmonary disease, unspecified: Secondary | ICD-10-CM | POA: Diagnosis not present

## 2024-04-17 DIAGNOSIS — Z1152 Encounter for screening for COVID-19: Secondary | ICD-10-CM | POA: Diagnosis not present

## 2024-04-17 DIAGNOSIS — Z6831 Body mass index (BMI) 31.0-31.9, adult: Secondary | ICD-10-CM | POA: Diagnosis not present

## 2024-04-17 DIAGNOSIS — E875 Hyperkalemia: Secondary | ICD-10-CM | POA: Diagnosis not present

## 2024-04-17 DIAGNOSIS — Z87891 Personal history of nicotine dependence: Secondary | ICD-10-CM | POA: Diagnosis not present

## 2024-04-17 DIAGNOSIS — Z91048 Other nonmedicinal substance allergy status: Secondary | ICD-10-CM | POA: Diagnosis not present

## 2024-04-17 DIAGNOSIS — Z823 Family history of stroke: Secondary | ICD-10-CM | POA: Diagnosis not present

## 2024-04-17 DIAGNOSIS — I5033 Acute on chronic diastolic (congestive) heart failure: Secondary | ICD-10-CM | POA: Diagnosis present

## 2024-04-17 DIAGNOSIS — I5031 Acute diastolic (congestive) heart failure: Secondary | ICD-10-CM | POA: Diagnosis not present

## 2024-04-17 DIAGNOSIS — Z7901 Long term (current) use of anticoagulants: Secondary | ICD-10-CM | POA: Diagnosis not present

## 2024-04-17 DIAGNOSIS — Z83719 Family history of colon polyps, unspecified: Secondary | ICD-10-CM | POA: Diagnosis not present

## 2024-04-17 DIAGNOSIS — Z8261 Family history of arthritis: Secondary | ICD-10-CM | POA: Diagnosis not present

## 2024-04-17 DIAGNOSIS — Z79899 Other long term (current) drug therapy: Secondary | ICD-10-CM | POA: Diagnosis not present

## 2024-04-17 DIAGNOSIS — R0602 Shortness of breath: Secondary | ICD-10-CM | POA: Diagnosis not present

## 2024-04-17 DIAGNOSIS — Z8249 Family history of ischemic heart disease and other diseases of the circulatory system: Secondary | ICD-10-CM | POA: Diagnosis not present

## 2024-04-17 DIAGNOSIS — D649 Anemia, unspecified: Secondary | ICD-10-CM | POA: Diagnosis not present

## 2024-04-17 DIAGNOSIS — I10 Essential (primary) hypertension: Secondary | ICD-10-CM | POA: Diagnosis not present

## 2024-04-17 DIAGNOSIS — J441 Chronic obstructive pulmonary disease with (acute) exacerbation: Secondary | ICD-10-CM | POA: Diagnosis not present

## 2024-04-17 DIAGNOSIS — N179 Acute kidney failure, unspecified: Secondary | ICD-10-CM | POA: Diagnosis not present

## 2024-04-17 DIAGNOSIS — I110 Hypertensive heart disease with heart failure: Secondary | ICD-10-CM | POA: Diagnosis not present

## 2024-04-17 DIAGNOSIS — G4733 Obstructive sleep apnea (adult) (pediatric): Secondary | ICD-10-CM | POA: Diagnosis not present

## 2024-04-17 LAB — COMPREHENSIVE METABOLIC PANEL WITH GFR
ALT: 32 U/L (ref 0–44)
AST: 23 U/L (ref 15–41)
Albumin: 2.6 g/dL — ABNORMAL LOW (ref 3.5–5.0)
Alkaline Phosphatase: 41 U/L (ref 38–126)
Anion gap: 13 (ref 5–15)
BUN: 29 mg/dL — ABNORMAL HIGH (ref 8–23)
CO2: 25 mmol/L (ref 22–32)
Calcium: 9.7 mg/dL (ref 8.9–10.3)
Chloride: 101 mmol/L (ref 98–111)
Creatinine, Ser: 1 mg/dL (ref 0.61–1.24)
GFR, Estimated: >60 mL/min (ref >60)
Glucose, Bld: 94 mg/dL (ref 70–99)
Potassium: 3.8 mmol/L (ref 3.5–5.1)
Sodium: 139 mmol/L (ref 135–145)
Total Bilirubin: 1 mg/dL (ref 0.0–1.2)
Total Protein: 6.1 g/dL — ABNORMAL LOW (ref 6.5–8.1)

## 2024-04-17 LAB — CBC
HCT: 39.5 % (ref 39.0–52.0)
Hemoglobin: 12.4 g/dL — ABNORMAL LOW (ref 13.0–17.0)
MCH: 28.1 pg (ref 26.0–34.0)
MCHC: 31.4 g/dL (ref 30.0–36.0)
MCV: 89.6 fL (ref 80.0–100.0)
Platelets: 341 10*3/uL (ref 150–400)
RBC: 4.41 MIL/uL (ref 4.22–5.81)
RDW: 17.3 % — ABNORMAL HIGH (ref 11.5–15.5)
WBC: 10.1 10*3/uL (ref 4.0–10.5)
nRBC: 0 % (ref 0.0–0.2)

## 2024-04-17 LAB — MAGNESIUM: Magnesium: 1.7 mg/dL (ref 1.7–2.4)

## 2024-04-17 LAB — C-REACTIVE PROTEIN: CRP: 3.3 mg/dL — ABNORMAL HIGH (ref <1.0)

## 2024-04-17 LAB — PHOSPHORUS: Phosphorus: 3.3 mg/dL (ref 2.5–4.6)

## 2024-04-17 MED ORDER — DILTIAZEM HCL 60 MG PO TABS
60.0000 mg | ORAL_TABLET | Freq: Four times a day (QID) | ORAL | Status: DC
  Administered 2024-04-17 – 2024-04-18 (×4): 60 mg via ORAL
  Filled 2024-04-17 (×4): qty 1

## 2024-04-17 MED ORDER — MELATONIN 5 MG PO TABS: 5.0000 mg | ORAL_TABLET | Freq: Every evening | ORAL | Status: DC | PRN

## 2024-04-17 MED ORDER — FUROSEMIDE 10 MG/ML IJ SOLN
40.0000 mg | Freq: Two times a day (BID) | INTRAMUSCULAR | Status: AC
  Administered 2024-04-17 (×2): 40 mg via INTRAVENOUS
  Filled 2024-04-17 (×2): qty 4

## 2024-04-17 MED ORDER — DILTIAZEM HCL 30 MG PO TABS
30.0000 mg | ORAL_TABLET | Freq: Once | ORAL | Status: AC
  Administered 2024-04-17: 30 mg via ORAL
  Filled 2024-04-17: qty 1

## 2024-04-17 NOTE — Telephone Encounter (Signed)
 Patient Product/process development scientist completed.    The patient is insured through [employer]. Patient has Medicare and is not eligible for a copay card, but may be able to apply for patient assistance or Medicare RX Payment Plan (Patient Must reach out to their plan, if eligible for payment plan), if available.    Ran test claim for Farixga 10 mg and the current 30 day co-pay is $139.91.  Ran test claim for Jardiance 10 mg and the current 30 day co-pay is $146.85.  This test claim was processed through [facility]- copay amounts may vary at other pharmacies due to pharmacy/plan contracts, or as the patient moves through the different stages of their insurance plan.     Morgan Arab, CPHT Pharmacy Technician III Certified Patient Advocate [facility] Patient Advocate Team Direct Number: [phone]  Fax: [phone]

## 2024-04-17 NOTE — Evaluation (Signed)
 Occupational Therapy Evaluation Patient Details Name: Mario [name] MRN: [id number] DOB: 1946/02/12 Today's Date: 04/17/2024   History of Present Illness   Patient is a 78 y/o male admitted 04/15/24 with SOB and wheezing.  Found to be in A-fib w/RVR and with COPD exacerbation.  PMH positive for A-fib w/RVR on Eliquis , CAD, HTN, HLD, COPD and bronchiectasis.     Clinical Impressions PTA, pt lived with spouse and was mod I for ADL and IADL. Pt reporting decreased activity tolerance since prior admission and difficulty returning to PLOF. Upon eval, pt with increased HR after ~5 min OOB ADL up to 154 and with 2/4 DOE. OT to continue to follow acutely. Based on report of difficulty returning to daily routines and recent admissions, recommending OP OT to optimize activity tolerance and strength.     If plan is discharge home, recommend the following:   A little help with walking and/or transfers;A little help with bathing/dressing/bathroom;Assistance with cooking/housework;Assist for transportation;Help with stairs or ramp for entrance     Functional Status Assessment   Patient has had a recent decline in their functional status and demonstrates the ability to make significant improvements in function in a reasonable and predictable amount of time.     Equipment Recommendations   None recommended by OT     Recommendations for Other Services         Precautions/Restrictions   Precautions Precautions: Fall Precaution/Restrictions Comments: watch O2 Restrictions Weight Bearing Restrictions Per Provider Order: No     Mobility Bed Mobility Overal bed mobility: Modified Independent             General bed mobility comments: some assist with lines    Transfers Overall transfer level: Needs assistance Equipment used: Rolling walker (2 wheels) Transfers: Sit to/from Stand Sit to Stand: Supervision           General transfer comment: assist for safety and  lines      Balance Overall balance assessment: Needs assistance           Standing balance-Leahy Scale: Fair                             ADL either performed or assessed with clinical judgement   ADL Overall ADL's : Needs assistance/impaired Eating/Feeding: Modified independent;Sitting   Grooming: Supervision/safety;Standing;Oral care   Upper Body Bathing: Set up;Sitting   Lower Body Bathing: Supervison/ safety;Sit to/from stand   Upper Body Dressing : Set up;Sitting   Lower Body Dressing: Supervision/safety;Sit to/from stand   Toilet Transfer: Supervision/safety;Rolling walker (2 wheels)           Functional mobility during ADLs: Supervision/safety;Rolling walker (2 wheels)       Vision Patient Visual Report: No change from baseline       Perception         Praxis         Pertinent Vitals/Pain Pain Assessment Pain Assessment: Faces Faces Pain Scale: Hurts little more Pain Location: L hand little finger, had infection then I&D in March Pain Descriptors / Indicators: Discomfort, Sore, Tender Pain Intervention(s): Limited activity within patient's tolerance     Extremity/Trunk Assessment Upper Extremity Assessment Upper Extremity Assessment: RUE deficits/detail RUE Deficits / Details: limited shoulder elevation with RCT; otherwise [facility]   Lower Extremity Assessment Lower Extremity Assessment: Defer to PT evaluation   Cervical / Trunk Assessment Cervical / Trunk Assessment: Kyphotic   Communication Communication Communication: No apparent difficulties  Cognition Arousal: Alert Behavior During Therapy: WFL for tasks assessed/performed Cognition: No apparent impairments                               Following commands: Intact       Cueing  General Comments   Cueing Techniques: Verbal cues  HR up to 154 after mobility   Exercises     Shoulder Instructions      Home Living Family/patient expects to be  discharged to:: Private residence Living Arrangements: Spouse/significant other Available Help at Discharge: Family Type of Home: House       Home Layout: Two level Alternate Level Stairs-Number of Steps: has not been able to go upstairs for a little while (since March), sleeping in recliner, has 15 steps Alternate Level Stairs-Rails: Left (bil first 4 steps) Bathroom Shower/Tub: Chief Strategy Officer: Standard     Home Equipment: Agricultural consultant (2 wheels);Cane - single point;Grab bars - tub/shower;Hand held shower head          Prior Functioning/Environment Prior Level of Function : Independent/Modified Independent             Mobility Comments: no falls in past 6 months      OT Problem List: Decreased strength;Decreased activity tolerance;Impaired balance (sitting and/or standing);Decreased safety awareness   OT Treatment/Interventions: Self-care/ADL training;Therapeutic exercise;DME and/or AE instruction;Therapeutic activities;Cognitive remediation/compensation;Balance training;Patient/family education      OT Goals(Current goals can be found in the care plan section)   Acute Rehab OT Goals Patient Stated Goal: go home OT Goal Formulation: With patient Time For Goal Achievement: 05/01/24 Potential to Achieve Goals: Good   OT Frequency:  Min 1X/week    Co-evaluation              AM-PAC OT "6 Clicks" Daily Activity     Outcome Measure Help from another person eating meals?: None Help from another person taking care of personal grooming?: A Little Help from another person toileting, which includes using toliet, bedpan, or urinal?: A Little Help from another person bathing (including washing, rinsing, drying)?: A Little Help from another person to put on and taking off regular upper body clothing?: A Little Help from another person to put on and taking off regular lower body clothing?: A Little 6 Click Score: 19   End of Session Equipment  Utilized During Treatment: Gait belt;Rolling walker (2 wheels) Nurse Communication: Mobility status  Activity Tolerance: Patient tolerated treatment well Patient left: in chair;with call bell/phone within reach;with chair alarm set  OT Visit Diagnosis: Unsteadiness on feet (R26.81);Muscle weakness (generalized) (M62.81);Other (comment) (decreased activity tolerance)                Time: 0454-0981 OT Time Calculation (min): 32 min Charges:  OT General Charges $OT Visit: 1 Visit OT Evaluation $OT Eval Low Complexity: 1 Low OT Treatments $Self Care/Home Management : 8-22 mins  Karilyn Ouch, OTR/L [facility] Acute Rehabilitation Office: [phone]   Emery Hans 04/17/2024, 11:58 AM

## 2024-04-17 NOTE — Progress Notes (Signed)
 Heart Failure Navigator Progress Note  Assessed for Heart & Vascular TOC clinic readiness.  Patient does not meet criteria due to EF 50-55%, admission with COPD exacerbation, plan for TEE/DCCV later once his COPD exacerbation has improved. No HF TOC. .   Navigator will sign off at this time.   Randie Bustle, BSN, Scientist, clinical (histocompatibility and immunogenetics) Only

## 2024-04-17 NOTE — Hospital Course (Addendum)
 PMH of CAD, HTN, HLD, bronchiectasis, chronic A-fib on Eliquis  presented to the hospital with complaints of cough and shortness of breath.  Had some RVR cardiology was consulted.  Currently on oral Cardizem .  Currently being treated for IV Lasix  with acute diastolic CHF.  Assessment and Plan: Paroxysmal A-fib with RVR. Rate remains elevated although improving. On metoprolol  and Cardizem . Cardiology planning TEE outpatient. Will monitor response.  Acute diastolic CHF. Receiving IV Lasix . Improving swelling in the leg. Monitor urine output and renal function.  Concern for COPD exacerbation. No wheezing at the time of my evaluation. Procalcitonin is negative. Chest x-ray is actually improving from prior. At present low concern for active pneumonia. On doxycycline  although given low procalcitonin I will discontinue the antibiotic. Continue present therapy.  Continue steroids.  History of CAD. Monitor for now. Management per cardiology.  HTN. Blood pressure stable.  Monitor.  HLD. On Crestor .  Obesity Class 1 Body mass index is 31.19 kg/m.  Placing the pt at higher risk of poor outcomes.  {Problem list (Optional):1}

## 2024-04-17 NOTE — Progress Notes (Signed)
 Progress Note  Patient Name: Mario [name] Date of Encounter: 04/17/2024  [facility] Cardiologist: Lauro Portal, MD   Patient Profile     Subjective   No CP.  Still has bad cough and coughing up phlegm  Remains in afib with RVR  Inpatient Medications    Scheduled Meds:  apixaban   5 mg Oral BID   arformoterol  15 mcg Nebulization BID   budesonide  (PULMICORT ) nebulizer solution  0.5 mg Nebulization BID   diltiazem   30 mg Oral Q6H   doxycycline   100 mg Oral BID   furosemide   40 mg Oral Daily   guaiFENesin   600 mg Oral BID   metoprolol   200 mg Oral Daily   montelukast   10 mg Oral Daily   pantoprazole   40 mg Oral BID   predniSONE   40 mg Oral Q breakfast   rosuvastatin   20 mg Oral QPM   Continuous Infusions:  PRN Meds: acetaminophen  **OR** acetaminophen , HYDROcodone-acetaminophen , levalbuterol , metoprolol  tartrate, ondansetron  **OR** ondansetron  (ZOFRAN ) IV   Vital Signs    Vitals:   04/16/24 1946 04/16/24 2100 04/17/24 0043 04/17/24 0759  BP:  131/85 (!) 115/92 125/87  Pulse:   99 98  Resp:  18 18 17   Temp:  98.3 F (36.8 C) 97.9 F (36.6 C) 98 F (36.7 C)  TempSrc:  Oral Oral Oral  SpO2: 94%   95%  Weight:      Height:        Intake/Output Summary (Last 24 hours) at 04/17/2024 0846 Last data filed at 04/17/2024 0400 Gross per 24 hour  Intake --  Output 500 ml  Net -500 ml      04/16/2024    1:15 PM 04/15/2024    6:45 PM 04/15/2024    3:38 PM  Last 3 Weights  Weight (lbs) 211 lb 3.2 oz 216 lb 216 lb 9.6 oz  Weight (kg) 95.8 kg 97.977 kg 98.249 kg      Telemetry    Atrial fibrillation with RVR- Personally Reviewed  ECG    No new EKG- Personally Reviewed  Physical Exam   GEN: Well nourished, well developed in no acute distress HEENT: Normal NECK: No JVD; No carotid bruits LYMPHATICS: No lymphadenopathy CARDIAC:irregularly irregular, no murmurs, rubs, gallops RESPIRATORY:  coarse rhonchi with cough ABDOMEN: Soft, non-tender,  non-distended MUSCULOSKELETAL:  1+ pedal  edema; No deformity  SKIN: Warm and dry NEUROLOGIC:  Alert and oriented x 3 PSYCHIATRIC:  Normal affect  Labs    High Sensitivity Troponin:   Recent Labs  Lab 04/15/24 1854 04/15/24 2051 04/15/24 2300  TROPONINIHS 6 7 7       Chemistry Recent Labs  Lab 04/15/24 2300 04/15/24 2321 04/16/24 0621 04/17/24 0442  NA 138 140 140 139  K 3.9 4.0 3.8 3.8  CL 105  --  104 101  CO2 22  --  26 25  GLUCOSE 119*  --  98 94  BUN 28*  --  25* 29*  CREATININE 1.17  --  1.03 1.00  CALCIUM  9.8  --  9.6 9.7  PROT 6.6  --  5.8* 6.1*  ALBUMIN 2.8*  --  2.5* 2.6*  AST 36  --  25 23  ALT 38  --  34 32  ALKPHOS 51  --  43 41  BILITOT 0.7  --  0.9 1.0  GFRNONAA >60  --  >60 >60  ANIONGAP 11  --  10 13     Hematology Recent Labs  Lab 04/15/24  1854 04/15/24 2321 04/16/24 0621 04/17/24 0442  WBC 7.0  --  8.5 10.1  RBC 4.63  --  4.27 4.41  HGB 13.0 12.6* 12.0* 12.4*  HCT 43.1 37.0* 38.6* 39.5  MCV 93.1  --  90.4 89.6  MCH 28.1  --  28.1 28.1  MCHC 30.2  --  31.1 31.4  RDW 17.3*  --  17.5* 17.3*  PLT 368  --  304 341    BNP Recent Labs  Lab 04/15/24 1854  BNP 644.3*     DDimer No results for input(s): "DDIMER" in the last 168 hours.   CHA2DS2-VASc Score = 4   This indicates a 4.8% annual risk of stroke. The patient's score is based upon: CHF History: 1 HTN History: 1 Diabetes History: 0 Stroke History: 0 Vascular Disease History: 0 Age Score: 2 Gender Score: 0   Radiology    ECHOCARDIOGRAM COMPLETE Result Date: 04/16/2024    ECHOCARDIOGRAM REPORT   Patient Name:   FREDYS VLASIC Bracey Date of Exam: 04/16/2024 Medical Rec #:  [id number]       Height:       69.0 in Accession #:    [id number]      Weight:       216.0 lb Date of Birth:  06/13/1946       BSA:          2.135 m Patient Age:    78 years        BP:           123/87 mmHg Patient Gender: M               HR:           97 bpm. Exam Location:  Inpatient Procedure: 2D Echo,  Cardiac Doppler and Color Doppler (Both Spectral and Color            Flow Doppler were utilized during procedure). Indications:    A Fib  History:        Patient has prior history of Echocardiogram examinations, most                 recent 10/31/2023. Arrythmias:Atrial Fibrillation; Risk                 Factors:Hypertension.  Sonographer:    Janette Medley Referring Phys: [id number] ANASTASSIA DOUTOVA IMPRESSIONS  1. Left ventricular ejection fraction, by estimation, is 50 to 55%. The left ventricle has low normal function. Left ventricular endocardial border not optimally defined to evaluate regional wall motion. There is mild concentric left ventricular hypertrophy. Left ventricular diastolic function could not be evaluated.  2. Right ventricular systolic function is low normal. The right ventricular size is normal.  3. Left atrial size was moderately dilated.  4. The mitral valve is normal in structure. Trivial mitral valve regurgitation. No evidence of mitral stenosis.  5. The aortic valve is grossly normal. There is mild calcification of the aortic valve. There is mild thickening of the aortic valve. Aortic valve regurgitation is not visualized. Aortic valve sclerosis/calcification is present, without any evidence of aortic stenosis. Comparison(s): No significant change from prior study. Prior images reviewed side by side. FINDINGS  Left Ventricle: Left ventricular ejection fraction, by estimation, is 50 to 55%. The left ventricle has low normal function. Left ventricular endocardial border not optimally defined to evaluate regional wall motion. The left ventricular internal cavity  size was normal in size. There is mild concentric left ventricular hypertrophy. Left ventricular diastolic function  could not be evaluated due to atrial fibrillation. Left ventricular diastolic function could not be evaluated. Right Ventricle: The right ventricular size is normal. No increase in right ventricular wall thickness. Right  ventricular systolic function is low normal. Left Atrium: Left atrial size was moderately dilated. Right Atrium: Right atrial size was normal in size. Prominent Eustachian valve. Pericardium: There is no evidence of pericardial effusion. Mitral Valve: The mitral valve is normal in structure. Trivial mitral valve regurgitation. No evidence of mitral valve stenosis. Tricuspid Valve: The tricuspid valve is normal in structure. Tricuspid valve regurgitation is mild. Aortic Valve: The aortic valve is grossly normal. There is mild calcification of the aortic valve. There is mild thickening of the aortic valve. Aortic valve regurgitation is not visualized. Aortic valve sclerosis/calcification is present, without any evidence of aortic stenosis. Pulmonic Valve: The pulmonic valve was not well visualized. Pulmonic valve regurgitation is not visualized. Aorta: The aortic root and ascending aorta are structurally normal, with no evidence of dilitation. IAS/Shunts: No atrial level shunt detected by color flow Doppler.  LEFT VENTRICLE PLAX 2D LVIDd:         4.10 cm   Diastology LVIDs:         2.90 cm   LV e' medial:    8.81 cm/s LV PW:         1.40 cm   LV E/e' medial:  7.2 LV IVS:        1.20 cm   LV e' lateral:   8.16 cm/s LVOT diam:     2.10 cm   LV E/e' lateral: 7.8 LV SV:         43 LV SV Index:   20 LVOT Area:     3.46 cm  RIGHT VENTRICLE             IVC RV S prime:     10.00 cm/s  IVC diam: 1.50 cm TAPSE (M-mode): 2.1 cm LEFT ATRIUM             Index        RIGHT ATRIUM           Index LA Vol (A2C):   88.2 ml 41.32 ml/m  RA Area:     13.10 cm LA Vol (A4C):   70.3 ml 32.93 ml/m  RA Volume:   27.20 ml  12.74 ml/m LA Biplane Vol: 80.8 ml 37.85 ml/m  AORTIC VALVE LVOT Vmax:   63.60 cm/s LVOT Vmean:  44.700 cm/s LVOT VTI:    0.124 m  AORTA Ao Root diam: 3.30 cm Ao Asc diam:  3.50 cm MV E velocity: 63.82 cm/s  TRICUSPID VALVE                            TR Peak grad:   28.5 mmHg                            TR Vmax:         267.00 cm/s                             SHUNTS                            Systemic VTI:  0.12 m  Systemic Diam: 2.10 cm Luana Rumple MD Electronically signed by Luana Rumple MD Signature Date/Time: 04/16/2024/1:42:20 PM    Final    DG Chest Port 1 View Result Date: 04/15/2024 EXAM: 1 VIEW(S) XRAY OF THE CHEST 04/15/2024 07:39:00 PM COMPARISON: 04/12/2024 CLINICAL HISTORY: 5107 Atrial fibrillation (HCC) 5107. Reports increased SOB and leg swelling. Saw pulmonology today and cardiology. Patient hx of Afib and was here approx 2 months ago for same. Patient sent by cardiology for Afib RVR 120-150s. FINDINGS: LUNGS AND PLEURA: Mild bibasilar scarring/atelectasis, unchanged. No consolidation. No pulmonary edema. No pleural effusion. No pneumothorax. HEART AND MEDIASTINUM: No acute abnormality of the cardiac and mediastinal silhouettes. BONES AND SOFT TISSUES: No acute osseous abnormality. IMPRESSION: 1. No acute cardiopulmonary pathology. Electronically signed by: Zadie Herter MD 04/15/2024 08:04 PM EDT RP Workstation: [id number]    Patient Profile     78 y.o. male with a history of three vessel coronary artery calcifications noted on prior chest CTs dating back to at least 2019 but low risk Myoview  in 04/2019, persistent atrial fibrillation on Eliquis , hypertension, hyperlipidemia, obstructive sleep apnea on CPAP, bronchiectasis, and obesity admitted from the office with A-fib with RVR and shortness of breath.  Assessment & Plan    Atrial Fibrillation with RVR Patient has history of persistent atrial fibrillation with last documented episode of sinus rhythm was in 12/2022.   He was recently admitted at the end of 01/2024 for rapid atrial fibrillation and home metoprolol  was significantly increased.   Patient has a history of COPD/ asthma and bronchiectasis and has had recurrent issues for COPD/ asthma exacerbation. He is currently on antibiotics and steroids.  Presented to  office with complaints of fatigue and shortness of breath and found to be in A-fib with RVR  Suspect that rapid heart rate related to COPD exacerbation as well as bronchodilators and steroid use  2D echo 04/16/24: EF 50-55%  He is not sure if he has missed a dose of Eliquis  in the last 3 weeks.  Would hold off on TEE/DCCV for now given active COPD exacerbation with wheezing.  Plan for rate control for now and consider later TEE/DCCV once his COPD exacerbation has improved or even as outpt if we can get HR controlled On max dose Toprol  XL 200mg  daily Increase Cardizem  to 60mg  q6 hours   Possible Acute Diastolic CHF Shortness of breath Patient reports significant dyspnea with minimal exertion found to have crackles and lower extremity edema He reports his weight is down 30lbs since last admission 2 months. Chest x-ray last week showed minimal bibasilar subsegmental atelectasis or scarring with minimal left pleural effusion.  Weight loss will need further workup Has not been compliant with his Lasix  that he supposed to be on 40 mg daily Echo in 10/2023 showed LVEF of 50-55% with normal wall motion and unchanged this admit TSH normal BNP mildly elevated at 644>> setting of normal chest x-ray specked BNP is primarily elevated due to acute COPD exacerbation X-ray with no edema 1 dose of Lasix  40 mg IV  put out 850 cc and now back on PO Lasix  40mg  daily Put out 500cc yesterday and net neg 1.35L since admit Has pitting pedal edema on exam today SCr 1 and K+ 3.8 Will give Lasix  40mg  IV BID today and reassess in am   Coronary Artery Calcifications 3-vessel coronary artery calcifications have been noted on prior chest CTs dating back to at least 2019. Myoview  in 04/2019 was low risk.  Cardiac PET stress test was  ordered at last visit to rule out dyspnea as an anginal equivalent. This is scheduled for 06/12/2023. Denies any chest pain No Aspirin  due to DOAC Continue Crestor  20 mg daily and Toprol  XL  200mg  daily  Hypertension BP elevated this am Continue Toprol  XL 200mg  daily Increase Cardizem  to 60mg  q6 hours for better HR and BP control   Hyperlipidemia - Continue Crestor  20mg  daily.    Obstructive Sleep Apnea - Continue CPAP.    I spent 35 minutes caring for this patient today face to face, ordering and reviewing labs, reviewing records from 2D echo  2024 , seeing the patient, documenting in the record, and arranging for a repeat 2D echo   For questions or updates, please contact [facility] Please consult [email or web address] for contact info under        Signed, Gaylyn Keas, MD  04/17/2024, 8:46 AM

## 2024-04-17 NOTE — Care Management Obs Status (Signed)
 MEDICARE OBSERVATION STATUS NOTIFICATION   Patient Details  Name: Mario [name] MRN: [id number] Date of Birth: [birth year]   Medicare Observation Status Notification Given:  Yes    Omie Bickers, RN 04/17/2024, 9:24 AM

## 2024-04-17 NOTE — Progress Notes (Signed)
 [facility] Progress Note Patient: Mario [name] ZOX:[id number] DOB: Nov 12, 1946 DOA: 04/15/2024  DOS: the patient was seen and examined on 04/17/2024  Brief Hospital Course: PMH of CAD, HTN, HLD, bronchiectasis, chronic A-fib on Eliquis  presented to the hospital with complaints of cough and shortness of breath.  Had some RVR cardiology was consulted.  Currently on oral Cardizem .  Currently being treated for IV Lasix  with acute diastolic CHF.  Assessment and Plan: Paroxysmal A-fib with RVR. Rate remains elevated although improving. On metoprolol  and Cardizem . Cardiology planning TEE outpatient. Will monitor response.  Acute diastolic CHF. Receiving IV Lasix . Improving swelling in the leg. Monitor urine output and renal function.  Concern for COPD exacerbation. No wheezing at the time of my evaluation. Procalcitonin is negative. Chest x-ray is actually improving from prior. At present low concern for active pneumonia. On doxycycline  although given low procalcitonin I will discontinue the antibiotic. Continue present therapy.  Continue steroids.  History of CAD. Monitor for now. Management per cardiology.  HTN. Blood pressure stable.  Monitor.  HLD. On Crestor .  Obesity Class 1 Body mass index is 31.19 kg/m.  Placing the pt at higher risk of poor outcomes.      Subjective: Report shortness of breath.  Reports burning in the leg.  No nausea or vomiting no fever no chills no chest pain.   Physical Exam: General: in Mild distress, No Rash Cardiovascular: S1 and S2 Present, No Murmur Respiratory: Increased respiratory effort, Bilateral Air entry present.  Bilateral crackles, No wheezes Abdomen: Bowel Sound present, No tenderness Extremities: Bilateral edema Neuro: Alert and oriented x3, no new focal deficit  Data Reviewed: I have Reviewed nursing notes, Vitals, and Lab results. Since last encounter, pertinent lab results CBC and BMP   . I have ordered test  including CBC and BMP  .   Disposition: Status is: Inpatient Remains inpatient appropriate because: Receiving diuresis  SCDs Start: 04/16/24 0005 apixaban  (ELIQUIS ) tablet 5 mg   Family Communication: No one at bedside Level of care: Telemetry Cardiac   Vitals:   04/17/24 1600 04/17/24 1611 04/17/24 1830 04/17/24 1956  BP:  (!) 111/96  109/85  Pulse: (!) 104 (!) 105 95 92  Resp:  20  18  Temp:  97.9 F (36.6 C)  97.6 F (36.4 C)  TempSrc:  Oral  Oral  SpO2: 94% 93% 95% 95%  Weight:      Height:         Author: Charlean Congress, MD 04/17/2024 8:18 PM  Please look on [email or web address] to find out who is on call.

## 2024-04-17 NOTE — Progress Notes (Signed)
 Initial Nutrition Assessment  DOCUMENTATION CODES:   Not applicable  INTERVENTION:  Ensure Enlive po BID, each supplement provides 350 kcal and 20 grams of protein.  MVI w/ minerals  NUTRITION DIAGNOSIS:  Increased nutrient needs related to acute illness (COPD exacerbation in the setting of bronchiectasis) as evidenced by estimated needs.  GOAL:  Patient will meet greater than or equal to 90% of their needs  MONITOR:  PO intake, Supplement acceptance  REASON FOR ASSESSMENT:  Consult Assessment of nutrition requirement/status  ASSESSMENT:  Pt has PMH significantly for Afib w/ RVR (Eliquis ), CAD, HTN, HLD, COPD and bronchiectasis. Presented with c/o worsening SOB and found to have COPD  exacerbation in the setting bronchiectasis. Recently hospitalized in [place] 2025 for afib w/ RVR.  Average Meal Intake No documented intake to review  No intake to review. Patient with visitor a time of assessment and requesting RD come back. Unable to collect recent diet history. Will do so at time of next bedside assessment. Likely inadequate intake for some time AEB his recent significant weight loss. Likely increased work of breathing contributing to this trend, but could also be fluid-related. Although, he was fluid overloaded on admission, which likely indicates even more significant weight loss.  Edema improving with diuresis.   Admit/Current Weight: 95.8kg  Per chart review, patient has shown 5% weight loss in last month, which is considered clinically significant for the time frame. Per MD note, he endorses 30lbs weight loss in two months. This is 12.5% weight loss and considered clinically significant for the time frame.    Intake/Output Summary (Last 24 hours) at 04/17/2024 2149 Last data filed at 04/17/2024 0908 Gross per 24 hour  Intake --  Output 700 ml  Net -700 ml    Net IO Since Admission: -1,550 mL [04/17/24 2149]   Continues on diuretics and steroids.  Meds: furosemide ,  doxycycline , pantoprazole , prednisone   Labs: Na+ 139 (wdl) K+ 3.8 (wdl) PHOS 3.3 (wdl) Mg 1.7 (wdl) CRP 3.3 (H) CBGs 94-98 x24 hours  NUTRITION - FOCUSED PHYSICAL EXAM: Deferred to in-person assessment  Diet Order:   Diet Order             Diet Heart Room service appropriate? Yes; Fluid consistency: Thin  Diet effective now            EDUCATION NEEDS:   Not appropriate for education at this time  Skin:  Skin Assessment: Reviewed RN Assessment  Last BM:  4/28  Height:  Ht Readings from Last 1 Encounters:  04/16/24 5\' 9"  (1.753 m)   Weight:  Wt Readings from Last 1 Encounters:  04/16/24 95.8 kg   BMI:  Body mass index is 31.19 kg/m.  Estimated Nutritional Needs:   Kcal:  1800-2000kcal  Protein:  85-100g  Fluid:  >1.8L/day  Con Decant MS, RD, LDN Registered Dietitian Clinical Nutrition RD Inpatient Contact Info in Amion

## 2024-04-17 NOTE — Progress Notes (Signed)
 Mobility Specialist Progress Note;   04/17/24 1411  Mobility  Activity Ambulated with assistance in hallway  Level of Assistance Standby assist, set-up cues, supervision of patient - no hands on  Assistive Device Front wheel walker  Distance Ambulated (ft) 350 ft  Activity Response Tolerated well  Mobility Referral Yes  Mobility visit 1 Mobility  Mobility Specialist Start Time (ACUTE ONLY) 1411  Mobility Specialist Stop Time (ACUTE ONLY) 1423  Mobility Specialist Time Calculation (min) (ACUTE ONLY) 12 min    During-mobility: HR up to 130 bpm Post-mobility: HR 95 bpm  Pt eager for mobility. Required no physical assistance during ambulation, SV. HR up to 130 bpm w/ activity. VSS on RA. Pt returned back to chair with all needs met, call bell in reach.   Janit Meline Mobility Specialist Please contact via SecureChat or [employer] [phone]

## 2024-04-18 DIAGNOSIS — I4891 Unspecified atrial fibrillation: Secondary | ICD-10-CM | POA: Diagnosis not present

## 2024-04-18 LAB — CBC
HCT: 40.7 % (ref 39.0–52.0)
Hemoglobin: 12.8 g/dL — ABNORMAL LOW (ref 13.0–17.0)
MCH: 28.3 pg (ref 26.0–34.0)
MCHC: 31.4 g/dL (ref 30.0–36.0)
MCV: 90 fL (ref 80.0–100.0)
Platelets: 422 10*3/uL — ABNORMAL HIGH (ref 150–400)
RBC: 4.52 MIL/uL (ref 4.22–5.81)
RDW: 17.2 % — ABNORMAL HIGH (ref 11.5–15.5)
WBC: 11.8 10*3/uL — ABNORMAL HIGH (ref 4.0–10.5)
nRBC: 0 % (ref 0.0–0.2)

## 2024-04-18 LAB — BASIC METABOLIC PANEL WITH GFR
Anion gap: 13 (ref 5–15)
BUN: 35 mg/dL — ABNORMAL HIGH (ref 8–23)
CO2: 29 mmol/L (ref 22–32)
Calcium: 10.3 mg/dL (ref 8.9–10.3)
Chloride: 98 mmol/L (ref 98–111)
Creatinine, Ser: 1.17 mg/dL (ref 0.61–1.24)
GFR, Estimated: >60 mL/min (ref >60)
Glucose, Bld: 104 mg/dL — ABNORMAL HIGH (ref 70–99)
Potassium: 4.5 mmol/L (ref 3.5–5.1)
Sodium: 140 mmol/L (ref 135–145)

## 2024-04-18 LAB — MAGNESIUM: Magnesium: 2 mg/dL (ref 1.7–2.4)

## 2024-04-18 MED ORDER — DILTIAZEM HCL ER 240 MG PO CP24: 240.0000 mg | ORAL_CAPSULE | Freq: Every day | ORAL | 0 refills | Status: DC

## 2024-04-18 MED ORDER — DEXTROMETHORPHAN POLISTIREX ER 30 MG/5ML PO SUER: 30.0000 mg | Freq: Two times a day (BID) | ORAL | 0 refills | Status: AC

## 2024-04-18 MED ORDER — PREDNISONE 10 MG PO TABS: ORAL_TABLET | ORAL | 0 refills | Status: DC

## 2024-04-18 MED ORDER — DILTIAZEM HCL ER COATED BEADS 180 MG PO CP24: 300.0000 mg | ORAL_CAPSULE | Freq: Every day | ORAL | Status: DC

## 2024-04-18 MED ORDER — LEVALBUTEROL HCL 0.63 MG/3ML IN NEBU: 0.6300 mg | INHALATION_SOLUTION | RESPIRATORY_TRACT | 2 refills | Status: DC | PRN

## 2024-04-18 MED ORDER — DILTIAZEM HCL ER COATED BEADS 240 MG PO CP24: 240.0000 mg | ORAL_CAPSULE | Freq: Every day | ORAL | Status: DC

## 2024-04-18 MED ORDER — DILTIAZEM HCL ER COATED BEADS 240 MG PO CP24
240.0000 mg | ORAL_CAPSULE | Freq: Every day | ORAL | Status: DC
  Administered 2024-04-18: 240 mg via ORAL
  Filled 2024-04-18: qty 1

## 2024-04-18 MED ORDER — FUROSEMIDE 40 MG PO TABS: 40.0000 mg | ORAL_TABLET | Freq: Every day | ORAL | 3 refills | Status: DC

## 2024-04-18 MED ORDER — BENZONATATE 200 MG PO CAPS: 200.0000 mg | ORAL_CAPSULE | Freq: Three times a day (TID) | ORAL | 1 refills | Status: DC | PRN

## 2024-04-18 MED ORDER — GUAIFENESIN ER 600 MG PO TB12: 600.0000 mg | ORAL_TABLET | Freq: Two times a day (BID) | ORAL | 0 refills | Status: AC

## 2024-04-18 MED ORDER — LEVALBUTEROL HCL 0.63 MG/3ML IN NEBU: INHALATION_SOLUTION | RESPIRATORY_TRACT | Status: DC

## 2024-04-18 NOTE — Progress Notes (Signed)
 Explained discharge instructions to patient. Reviewed follow up appointment and next medication administration times. Also reviewed education. Patient verbalized having an understanding for instructions given. All belongings are in the patient's possession. IV and telemetry were removed by patient's RN. No other needs verbalized. Transporting downstairs to the discharge lounge to await his nebulizer equipment.

## 2024-04-18 NOTE — Discharge Instructions (Addendum)
Heart-Healthy Nutrition Therapy  A heart-healthy diet is recommended to reduce your unhealthy blood cholesterol levels, manage high blood pressure, and lower your risk for heart disease. To follow a heart-healthy diet, Eat a balanced diet with whole grains, fruits and vegetables, and lean protein sources. Achieve and maintain a healthy weight. Choose heart-healthy unsaturated fats. Limit saturated fats, trans fats, and cholesterol intake. Eat more plant-based or vegetarian meals using beans and soy foods for protein. Eat whole, unprocessed foods to limit the amount of sodium (salt) you eat. Limit refined carbohydrates especially sugar, sweets and sugar-sweetened beverages. If you drink alcohol, do so in moderation: one serving per day (women) and two servings per day (men). One serving is equivalent to 12 ounces beer, 5 ounces wine, or 1.5 ounces distilled spirits  Tips Tips for Choosing Heart-Healthy Fats Choose lean protein and low-fat dairy foods to reduce saturated fat intake. Saturated fat is usually found in animal-based protein and is associated with certain health risks. Saturated fat is the biggest contributor to raised low-density lipoprotein (LDL) cholesterol levels in the diet. Research shows that limiting saturated fat lowers unhealthy cholesterol levels. Eat no more than 5-6% of your total calories each day from saturated fat. Ask your registered dietitian nutritionist (RDN) to help you determine how much saturated fat is right for you. There are many foods that do not contain large amounts of saturated fats. Swapping these foods to replace foods high in saturated fats will help you limit the saturated fat you eat and improve your cholesterol levels. You can also try eating more plant-based or vegetarian meals. Instead of. Try:  Whole milk, cheese, yogurt, and ice cream 1%, %, or skim milk, low-fat cheese, non-fat yogurt, and low-fat ice cream  Fatty, marbled beef and pork Lean  beef, pork, or venison  Poultry with skin Poultry without skin  Butter, stick margarine Reduced-fat, whipped, or liquid spreads  Coconut oil, palm oil Liquid vegetable oils: corn, canola, olive, soybean and safflower oils   Avoid trans fats. Trans fats increase levels of LDL-cholesterol. Hydrogenated fat in processed foods is the main source of trans fats in foods.  Trans fats can be found in stick margarine, shortening, processed sweets, baked goods, some fried foods, and packaged foods made with hydrogenated oils. Avoid foods with "partially hydrogenated oil" on the ingredient list such as: cookies, pastries, baked goods, biscuits, crackers, microwave popcorn, and frozen dinners. Choose foods with heart healthy fats. Polyunsaturated and monounsaturated fat are unsaturated fats that may help lower your blood cholesterol level when used in place of saturated fat in your diet. Ask your RDN about taking a dietary supplement with plant sterols and stanols to help lower your cholesterol level. Research shows that substituting saturated fats with unsaturated fats is beneficial to cholesterol levels. Try these easy swaps:   Instead of. Try:  Butter, stick margarine, or solid shortening Reduced-fat, whipped, or liquid spreads  Beef, pork, or poultry with skin   Fish and seafood  Chips, crackers, snack foods Raw or unsalted nuts and seeds or nut butters Hummus with vegetables Avocado on toast  Coconut oil, palm oil Liquid vegetable oils: corn, canola, olive, soybean and safflower oils    Limit the amount of cholesterol you eat to less than 200 milligrams per day. Cholesterol is a substance carried through the bloodstream via lipoproteins, which are known as "transporters" of fat. Some body functions need cholesterol to work properly, but too much cholesterol in the bloodstream can damage arteries and build up blood   vessel linings (which can lead to heart attack and stroke). You should eat less than  200 milligrams cholesterol per day. People respond differently to eating cholesterol. There is no test available right now that can figure out which people will respond more to dietary cholesterol and which will respond less. For individuals with high intake of dietary cholesterol, different types of increase (none, small, moderate, large) in LDL-cholesterol levels are all possible.   Food sources of cholesterol include egg yolks and organ meats such as liver, gizzards.  Limit egg yolks to two to four per week and avoid organ meats like liver and gizzards to control cholesterol intake.  Tips for Choosing Heart-Healthy Carbohydrates Consume foods rich in viscous (soluble) fiber Viscous, or soluble, fiber is found in the walls of plant cells. Viscous fiber is found only in plant-based foods--animal-based foods like meat or dairy products do not contain fiber. In the stomach, viscous fibers absorb water and swell to form a thick, jelly-like mass. This helps to lower your unhealthy cholesterol Rich sources of viscous fiber include asparagus, Brussels sprouts, sweet potatoes, turnips, apricots, mangoes, oranges, legumes, barley, oats, and oat bran. Eat at least 5 to 10 grams of viscous fiber each day. As you increase your fiber intake gradually, also increase the amount of water you drink. This will help prevent constipation. If you have difficulty achieving this goal, ask your RDN about fiber laxatives. Choose fiber supplements made with viscous fibers such as psyllium seed husks or methylcellulose to help lower unhealthy cholesterol. Limit refined carbohydrates There are three types of carbohydrates: starches, sugar, and fiber. Some carbohydrates occur naturally in food, like the starches in rice or corn or the sugars in fruits and milk. Refined carbohydrates--foods with high amounts of simple sugars--can raise triglyceride levels. High triglyceride levels are associated with coronary heart disease. Some  examples of refined carbohydrate foods are table sugar, sweets, and beverages sweetened with added sugar. Tips for Reducing Sodium (Salt) Although sodium is important for your body to function, too much sodium can be harmful for people with high blood pressure.  As sodium and fluid buildup in your tissues and bloodstream, your blood pressure increases. High blood pressure may cause damage to other organs and increase your risk for a stroke. Keep your salt intake to 2300 milligrams or less per day. Even if you take a pill for blood pressure or a water pill (diuretic) to remove fluid, it is still important to have less salt in your diet. Ask your RDN what amount of sodium is right for you. Avoid processed foods.  Eat more fresh foods. Fresh fruits and vegetables are naturally low in sodium, as well as frozen vegetables and fruits that have no added juices or sauces. Fresh meats are lower in sodium than processed meats, such as bacon, sausage, and hotdogs.  Read the nutrition label or ask your butcher to help you find a fresh meat that is low in sodium. Eat less salt--at the table and when cooking. A single teaspoon of table salt has 2,300 mg of sodium. Leave the salt out of recipes for pasta, casseroles, and soups. Ask your RDN how to cook your favorite recipes without sodium Be a smart shopper. Look for food packages that say "salt-free" or "sodium-free." These items contain less than 5 milligrams of sodium per serving. "Very low-sodium" products contain less than 35 milligrams of sodium per serving. "Low-sodium" products contain less than 140 milligrams of sodium per serving.  Beware of "reduced salt" or "reduced   sodium" products.  These items may still be high in sodium. Check the nutrition label.  Add flavors to your food without adding sodium. Try lemon juice, lime juice, fruit juice or vinegar.   Dry or fresh herbs add flavor. Try basil, bay leaf, dill, rosemary, parsley, sage, dry mustard,  nutmeg, thyme, and paprika. Pepper, red pepper flakes, and cayenne pepper can add spice to your meals without adding sodium. Hot sauce contains sodium, but if you use just a drop or two, it will not add up to much. Buy a sodium-free seasoning blend or make your own at home.    Additional Lifestyle Tips Achieve and maintain a healthy weight. Talk with your RDN or your doctor about what is a healthy weight for you. Set goals to reach and maintain that weight.  To lose weight, reduce your calorie intake along with increasing your physical activity. A weight loss of 10 to 15 pounds could reduce LDL-cholesterol by 5 milligrams per deciliter. Participate in physical activity. Talk with your health care team to find out what types of physical activity are best for you. Set a plan to get about 30 minutes of exercise on most days.   Foods to Choose or to Limit Food Group Foods to Choose Foods to Limit  Grains Whole grain breads and cereals, including whole wheat, barley, rye, buckwheat, corn, teff, quinoa, millet, amaranth, brown or wild rice, sorghum, and oats Pasta, especially whole wheat or other whole grain types Brown rice, quinoa or wild rice Whole grain crackers, bread, rolls, pitas Home-made bread with reduced-sodium baking soda Breads or crackers topped with salt Cereals (hot or cold) with more than 300 mg sodium per serving Biscuits, cornbread, and other "quick" breads prepared with baking soda Bread crumbs or stuffing mix from a store High-fat bakery products, such as doughnuts, biscuits, croissants, danish pastries, pies, cookies Instant cooking foods to which you add hot water and stir--potatoes, noodles, rice, etc. Packaged starchy foods--seasoned noodle or rice dishes, stuffing mix, macaroni and cheese dinner Snacks made with partially hydrogenated oils, including chips, cheese puffs, snack mixes, regular crackers, butter-flavored popcorn  Protein Foods Lean cuts of beef and pork  (loin, leg, round, extra lean hamburger) Skinless poultry Fish Venison and other wild game Dried beans and peas Nuts and nut butters (unsalted) Seeds and seed butters (unsalted) Meat alternatives made with soy or textured vegetable protein Egg whites or egg substitute Cold cuts made with lean meat or soy protein Higher-fat cuts of meats (ribs, t-bone steak, regular hamburger) Bacon, sausage, or hot dogs Cold cuts, such as salami or bologna, deli meats, cured meats, corned beef Organ meats (liver, brains, gizzards, sweetbreads) Poultry with skin Fried or smoked meat, poultry, and fish Whole eggs and egg yolks (more than 2-4 per week) Salted legumes, nuts, seeds, or nut/seed butters Meat alternatives with high levels of sodium (>300 mg per serving) or saturated fat (>5 g per serving)  Dairy and Dairy Alternatives Nonfat (skim), low-fat, or 1%-fat milk Nonfat or low-fat yogurt or cottage cheese Fat-free and low-fat cheese Fortified non-dairy milk: almond, cashew, pea, and soy Whole milk, 2% fat milk, buttermilk Whole milk yogurt or ice cream Cream, half-&-half Cream cheese Sour cream Cheese  Vegetables Fresh, frozen, or canned vegetables without added fat or salt Avocados Canned or frozen vegetables with salt, fresh vegetables prepared with salt, butter, cheese, or cream sauce Fried vegetables Pickled vegetables such as olives, pickles, or sauerkraut  Fruits Fresh, frozen, canned, or dried fruit Fried fruits Fruits   served with butter or cream  Oils Unsaturated oils (corn, olive, peanut, soy, sunflower, canola) Soft or liquid margarines and vegetable oil spreads Salad dressings made from saturated fats Butter, stick margarine, shortening Partially hydrogenated oils or trans fats Tropical oils (coconut, palm, palm kernel oils)  Other Prepared or homemade foods, including soups, casseroles, and salads made from recommended ingredients and contain <600 mg sodium. Low-sodium seasonings  (ketchup, barbeque sauce) Spices, herbs, Salt-free seasoning mixes and marinades Vinegar Lemon or lime juice Prepared foods, including soups, casseroles, and salads made from recommended ingredients and contain >600 mg sodium. Frozen meals and prepared sides that are >600 mg of sodium Sugary and/or fatty desserts, candy, and other sweets Salts:  sea salt, kosher salt, onion salt, and garlic salt, seasoning mixes containing salt Flavorings: bouillon cubes, catsup or ketchup, barbeque sauce, Worcestershire sauce, soy sauce, salsa, relish, teriyaki sauce   Heart-Healthy Sample 1-Day Menu View Nutrient Info Breakfast 1 cup oatmeal 1 cup fat-free milk 1 cup blueberries 1 ounce almonds, unsalted 1 cup brewed coffee  Lunch 2 slices whole-wheat bread 2 ounces lean turkey breast 1 ounce low-fat Swiss cheese 2 slices tomato 2 lettuce leaves 1 pear 1 cup skim milk  Afternoon Snack 1 ounce trail mix with unsalted nuts, seeds, and raisins  Evening Meal 3 ounces broiled salmon 2/3 cup brown rice 1 teaspoon margarine, soft tub  cup broccoli, cooked  cup carrots, cooked 1 cup tossed salad 1 teaspoon olive oil and vinegar dressing 1 small whole-wheat roll 1 teaspoon margarine, soft tub 1 cup tea  Evening Snack 1 small banana  Daily Sum Nutrient Unit Value  Macronutrients  Energy kcal 2069  Energy kJ 8655  Protein g 146  Total lipid (fat) g 69  Carbohydrate, by difference g 228  Fiber, total dietary g 33  Sugars, total g 85  Minerals  Calcium, Ca mg 1292  Iron, Fe mg 14  Sodium, Na mg 1751  Vitamins  Vitamin C, total ascorbic acid mg 85  Vitamin A, IU IU 17756  Vitamin D IU 231  Lipids  Fatty acids, total saturated g 12  Fatty acids, total monounsaturated g 27  Fatty acids, total polyunsaturated g 24  Cholesterol mg 270     Heart-Healthy Vegan 1-Day Sample Menu View Nutrient Info Breakfast 1 slice whole wheat toast 2 tablespoons peanut butter without salt Tofu scramble  made with:  cup calcium-set tofu  cup green pepper  cup spinach  cup tomatoes  cup white mushrooms 1 teaspoon canola oil 1 cup soymilk fortified with calcium, vitamin B12, and vitamin D  Lunch 1 cup reduced sodium split pea soup 1 whole wheat dinner roll 1 medium apple  Dinner Salad made with: 1 cup lentils  cup cooked broccoli  cup cooked carrots 2 tablespoons hummus 1 cup sliced strawberries 1 cup soymilk fortified with calcium, vitamin B12, and vitamin D  Evening Snack 1 cup soy yogurt  cup mixed nuts  Daily Sum Nutrient Unit Value  Macronutrients  Energy kcal 1672  Energy kJ 7000  Protein g 86  Total lipid (fat) g 65  Carbohydrate, by difference g 205  Fiber, total dietary g 47  Sugars, total g 73  Minerals  Calcium, Ca mg 1443  Iron, Fe mg 19  Sodium, Na mg 1148  Vitamins  Vitamin C, total ascorbic acid mg 253  Vitamin A, IU IU 15451  Vitamin D IU 361  Lipids  Fatty acids, total saturated g 11  Fatty acids, total monounsaturated g   29  Fatty acids, total polyunsaturated g 19  Cholesterol mg 5     Heart-Healthy Vegetarian (Lacto-Ovo) Sample 1-Day Menu View Nutrient Info Breakfast 1 cup cooked oatmeal 1 tablespoon ground flaxseed 1 cup blueberries 2 scrambled egg whites with 1 teaspoon canola oil 2 tablespoons salsa 1 cup fat-free milk 1 cup coffee Oil, canola  Lunch 2 slices whole wheat bread 2 tablespoons peanut butter without salt 1 small banana 6 ounces fat-free plain yogurt 1 cup sliced red pepper 2 tablespoons hummus 1 cup fat-free milk  Evening Meal Stir fry made with:  cup tofu 1 cup brown rice  cup cooked broccoli  cup cooked carrots  cup cooked green beans 1 teaspoon peanut oil  Evening Snack 1 slice low-fat mozzarella cheese 1 medium apple  Daily Sum Nutrient Unit Value  Macronutrients  Energy kcal 1764  Energy kJ 7375  Protein g 87  Total lipid (fat) g 53  Carbohydrate, by difference g 254  Fiber, total dietary g 35   Sugars, total g 98  Minerals  Calcium, Ca mg 1609  Iron, Fe mg 12  Sodium, Na mg 1434  Vitamins  Vitamin C, total ascorbic acid mg 210  Vitamin A, IU IU 16317  Vitamin D IU 234  Lipids  Fatty acids, total saturated g 12  Fatty acids, total monounsaturated g 21  Fatty acids, total polyunsaturated g 15  Cholesterol mg 31    Copyright 2020  [employer]. All rights reserved  

## 2024-04-18 NOTE — Discharge Summary (Signed)
 Physician Discharge Summary   Patient: Mario [name] MRN: [id number] DOB: Oct 10, 1946  Admit date:     04/15/2024  Discharge date: 04/18/2024  Discharge Physician: Charlean Congress  PCP: Austine Lefort, MD  Recommendations at discharge: Follow-up with PCP in 1 week with a BMP. Follow-up with cardiology as recommended.   Follow-up Information     Austine Lefort, MD. Schedule an appointment as soon as possible for a visit in 1 week(s).   Specialty: Family Medicine Why: with BMP lab to look at kidney and electrolytes Contact information: [address] [place] [address] [phone]         Avanell Leigh, MD. Schedule an appointment as soon as possible for a visit in 2 week(s).   Specialties: Cardiology, Radiology Contact information: [address] [place] [address] [phone]         [facility] [employer] Follow up.   Why: to be delivered via the d/c lounge. Contact information: 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)    Hospital Course: PMH of CAD, HTN, HLD, bronchiectasis, chronic A-fib on Eliquis  presented to the hospital with complaints of cough and shortness of breath.  Had some RVR cardiology was consulted.  Currently on oral Cardizem .  Currently being treated for IV Lasix  with acute diastolic CHF.  Assessment and Plan: Paroxysmal A-fib with RVR. Rate remains elevated although improving. On metoprolol  and  Cardizem . Cardiology planning TEE outpatient.  Acute diastolic CHF. Receiving IV Lasix . Improving swelling in the leg. Recommending additional as needed Lasix  if he gains weight. Monitor urine output and renal function.  Concern for COPD exacerbation. No wheezing at the time of my evaluation. Procalcitonin is negative. Chest x-ray is actually improving from prior. At present low concern for active pneumonia. On doxycycline  although given low procalcitonin I will discontinue the antibiotic. Continue present therapy.  Continue steroids. Continue Xopenex  on scheduled basis on discharge.  History of CAD. Monitor for now. Management per cardiology.  HTN. Blood pressure stable.  Monitor.  HLD. On Crestor .  Obesity Class 1 Body mass index is 31.19 kg/m.  Placing the pt at higher risk of poor outcomes.      Consultants:  Cardiology  Procedures performed:  Echocardiogram   DISCHARGE MEDICATION: Allergies as of 04/18/2024       Reactions   Dust Mite Extract Other (See Comments)        Medication List     STOP taking these medications    doxycycline  100 MG capsule Commonly known as: VIBRAMYCIN        TAKE these medications    acetaminophen  500 MG tablet Commonly known as: TYLENOL  Take 1,000 mg by mouth every 6 (six) hours as needed for mild pain (pain score 1-3).   benzonatate  200 MG capsule Commonly known as: TESSALON  Take 1 capsule (200 mg total) by mouth 3 (three) times daily as needed for cough.   budesonide -formoterol  160-4.5 MCG/ACT  inhaler Commonly known as: Symbicort  Inhale 2 puffs into the lungs in the morning and at bedtime. Generic medication   Co Q-10 100 MG Caps Take 1 capsule by mouth daily.   dextromethorphan  30 MG/5ML liquid Commonly known as: Delsym  Take 5 mLs (30 mg total) by mouth 2 (two) times daily for 7 days.   diltiazem  240 MG 24 hr capsule Commonly known as: DILACOR XR  Take 1 capsule (240 mg total) by mouth daily.    Eliquis  5 MG Tabs tablet Generic drug: apixaban  TAKE 1 TABLET BY MOUTH TWICE A DAY   FIBER COMPLETE PO Take 1 tablet by mouth daily.   Flutter Devi Use as directed   furosemide  40 MG tablet Commonly known as: LASIX  Take 1 tablet (40 mg total) by mouth daily. Take additional 20 mg if your weight is 3 lbs in 1 day of 5 lbs in 1 week What changed: additional instructions   guaiFENesin  600 MG 12 hr tablet Commonly known as: MUCINEX  Take 1 tablet (600 mg total) by mouth 2 (two) times daily.   levalbuterol  0.63 MG/3ML nebulizer solution Commonly known as: XOPENEX  Take 3 mLs (0.63 mg total) by nebulization 4 (four) times daily for 7 days, THEN 3 mLs (0.63 mg total) every 6 (six) hours as needed for wheezing or shortness of breath. Start taking on: Apr 18, 2024 What changed: See the new instructions.   metoprolol  succinate 100 MG 24 hr tablet Commonly known as: TOPROL -XL Take 2 tablets (200 mg total) by mouth daily. Take with or immediately following a meal. What changed: Another medication with the same name was removed. Continue taking this medication, and follow the directions you see here.   montelukast  10 MG tablet Commonly known as: SINGULAIR  Take 1 tablet (10 mg total) by mouth daily.   pantoprazole  40 MG tablet Commonly known as: PROTONIX  Take 1 tablet (40 mg total) by mouth 2 (two) times daily.   predniSONE  10 MG tablet Commonly known as: DELTASONE  Take 40mg  daily for 3days,Take 30mg  daily for 3days,Take 20mg  daily for 3days,Take 10mg  daily for 3days, then stop Start taking on: Apr 19, 2024 What changed: additional instructions   rosuvastatin  20 MG tablet Commonly known as: CRESTOR  TAKE 1 TABLET BY MOUTH EVERY DAY **D/C LIPITOR** What changed:  how much to take how to take this when to take this additional instructions   vitamin C 1000 MG tablet Take 1,000 mg by mouth 2 (two) times daily.   Vitamin D3 125 MCG (5000 UT) Tabs Take 1 tablet by mouth 2 (two)  times daily.               Durable Medical Equipment  (From admission, onward)           Start     Ordered   04/18/24 1522  For home use only DME Nebulizer machine  Once       Question Answer Comment  Patient needs a nebulizer to treat with the following condition COPD (chronic obstructive pulmonary disease) (HCC)   Length of Need Lifetime   Additional equipment included Administration kit      04/18/24 1521           Disposition: Home Diet recommendation: Cardiac diet  Discharge Exam: Vitals:   04/17/24 2029 04/18/24 0516 04/18/24 0733 04/18/24 0735  BP:  119/82    Pulse:  89    Resp:  18    Temp:  97.6 F (36.4 C)    TempSrc:  Oral  SpO2: 95% 95% 94% 94%  Weight:      Height:       General: Appear in no distress; no visible Abnormal Neck Mass Or lumps, Conjunctiva normal Cardiovascular: S1 and S2 Present, no Murmur, Respiratory: good respiratory effort, Bilateral Air entry present and Occasional  Crackles, Occasional  wheezes Abdomen: Bowel Sound present, Non tender  Extremities: no Pedal edema Neurology: alert and oriented to time, place, and person  Filed Weights   04/15/24 1845 04/16/24 1315  Weight: 98 kg 95.8 kg   Condition at discharge: stable  The results of significant diagnostics from this hospitalization (including imaging, microbiology, ancillary and laboratory) are listed below for reference.   Imaging Studies: ECHOCARDIOGRAM COMPLETE Result Date: 04/16/2024    ECHOCARDIOGRAM REPORT   Patient Name:   JAVONNE BORKE Eckhart Date of Exam: 04/16/2024 Medical Rec #:  [id number]       Height:       69.0 in Accession #:    [id number]      Weight:       216.0 lb Date of Birth:  [birth year]       BSA:          2.135 m Patient Age:    78 years        BP:           123/87 mmHg Patient Gender: M               HR:           97 bpm. Exam Location:  Inpatient Procedure: 2D Echo, Cardiac Doppler and Color Doppler (Both Spectral and Color            Flow Doppler  were utilized during procedure). Indications:    A Fib  History:        Patient has prior history of Echocardiogram examinations, most                 recent 10/31/2023. Arrythmias:Atrial Fibrillation; Risk                 Factors:Hypertension.  Sonographer:    Janette Medley Referring Phys: [id number] ANASTASSIA DOUTOVA IMPRESSIONS  1. Left ventricular ejection fraction, by estimation, is 50 to 55%. The left ventricle has low normal function. Left ventricular endocardial border not optimally defined to evaluate regional wall motion. There is mild concentric left ventricular hypertrophy. Left ventricular diastolic function could not be evaluated.  2. Right ventricular systolic function is low normal. The right ventricular size is normal.  3. Left atrial size was moderately dilated.  4. The mitral valve is normal in structure. Trivial mitral valve regurgitation. No evidence of mitral stenosis.  5. The aortic valve is grossly normal. There is mild calcification of the aortic valve. There is mild thickening of the aortic valve. Aortic valve regurgitation is not visualized. Aortic valve sclerosis/calcification is present, without any evidence of aortic stenosis. Comparison(s): No significant change from prior study. Prior images reviewed side by side. FINDINGS  Left Ventricle: Left ventricular ejection fraction, by estimation, is 50 to 55%. The left ventricle has low normal function. Left ventricular endocardial border not optimally defined to evaluate regional wall motion. The left ventricular internal cavity  size was normal in size. There is mild concentric left ventricular hypertrophy. Left ventricular diastolic function could not be evaluated due to atrial fibrillation. Left ventricular diastolic function could not be evaluated. Right Ventricle: The right ventricular size is normal. No increase in right ventricular wall thickness. Right ventricular  systolic function is low normal. Left Atrium: Left atrial size was  moderately dilated. Right Atrium: Right atrial size was normal in size. Prominent Eustachian valve. Pericardium: There is no evidence of pericardial effusion. Mitral Valve: The mitral valve is normal in structure. Trivial mitral valve regurgitation. No evidence of mitral valve stenosis. Tricuspid Valve: The tricuspid valve is normal in structure. Tricuspid valve regurgitation is mild. Aortic Valve: The aortic valve is grossly normal. There is mild calcification of the aortic valve. There is mild thickening of the aortic valve. Aortic valve regurgitation is not visualized. Aortic valve sclerosis/calcification is present, without any evidence of aortic stenosis. Pulmonic Valve: The pulmonic valve was not well visualized. Pulmonic valve regurgitation is not visualized. Aorta: The aortic root and ascending aorta are structurally normal, with no evidence of dilitation. IAS/Shunts: No atrial level shunt detected by color flow Doppler.  LEFT VENTRICLE PLAX 2D LVIDd:         4.10 cm   Diastology LVIDs:         2.90 cm   LV e' medial:    8.81 cm/s LV PW:         1.40 cm   LV E/e' medial:  7.2 LV IVS:        1.20 cm   LV e' lateral:   8.16 cm/s LVOT diam:     2.10 cm   LV E/e' lateral: 7.8 LV SV:         43 LV SV Index:   20 LVOT Area:     3.46 cm  RIGHT VENTRICLE             IVC RV S prime:     10.00 cm/s  IVC diam: 1.50 cm TAPSE (M-mode): 2.1 cm LEFT ATRIUM             Index        RIGHT ATRIUM           Index LA Vol (A2C):   88.2 ml 41.32 ml/m  RA Area:     13.10 cm LA Vol (A4C):   70.3 ml 32.93 ml/m  RA Volume:   27.20 ml  12.74 ml/m LA Biplane Vol: 80.8 ml 37.85 ml/m  AORTIC VALVE LVOT Vmax:   63.60 cm/s LVOT Vmean:  44.700 cm/s LVOT VTI:    0.124 m  AORTA Ao Root diam: 3.30 cm Ao Asc diam:  3.50 cm MV E velocity: 63.82 cm/s  TRICUSPID VALVE                            TR Peak grad:   28.5 mmHg                            TR Vmax:        267.00 cm/s                             SHUNTS                             Systemic VTI:  0.12 m                            Systemic Diam: 2.10 cm Karyl Paget Croitoru MD Electronically signed by Luana Rumple MD Signature Date/Time: 04/16/2024/1:42:20 PM  Final    DG Chest Port 1 View Result Date: 04/15/2024 EXAM: 1 VIEW(S) XRAY OF THE CHEST 04/15/2024 07:39:00 PM COMPARISON: 04/12/2024 CLINICAL HISTORY: 5107 Atrial fibrillation (HCC) 5107. Reports increased SOB and leg swelling. Saw pulmonology today and cardiology. Patient hx of Afib and was here approx 2 months ago for same. Patient sent by cardiology for Afib RVR 120-150s. FINDINGS: LUNGS AND PLEURA: Mild bibasilar scarring/atelectasis, unchanged. No consolidation. No pulmonary edema. No pleural effusion. No pneumothorax. HEART AND MEDIASTINUM: No acute abnormality of the cardiac and mediastinal silhouettes. BONES AND SOFT TISSUES: No acute osseous abnormality. IMPRESSION: 1. No acute cardiopulmonary pathology. Electronically signed by: Zadie Herter MD 04/15/2024 08:04 PM EDT RP Workstation: [id number]   DG Chest 2 View Result Date: 04/12/2024 CLINICAL DATA:  Cough for several months. EXAM: CHEST - 2 VIEW COMPARISON:  February 15, 2024. FINDINGS: Stable cardiomediastinal silhouette. Minimal bibasilar subsegmental atelectasis or scarring is noted. Minimal left pleural effusion may be present. Severe compression deformity of lower thoracic vertebral body is noted concerning for fracture of indeterminate age. IMPRESSION: Minimal bibasilar subsegmental atelectasis or scarring, with minimal left pleural effusion. Severe compression deformity of lower thoracic vertebral body concerning for fracture of indeterminate age. Electronically Signed   By: Rosalene Colon M.D.   On: 04/12/2024 11:42    Microbiology: Results for orders placed or performed during the hospital encounter of 04/15/24  Respiratory (~20 pathogens) panel by PCR     Status: None   Collection Time: 04/15/24 11:00 PM   Specimen: Respiratory  Result Value Ref  Range Status   Adenovirus NOT DETECTED NOT DETECTED Final   Coronavirus 229E NOT DETECTED NOT DETECTED Final    Comment: (NOTE) The Coronavirus on the Respiratory Panel, DOES NOT test for the novel  Coronavirus (2019 nCoV)    Coronavirus HKU1 NOT DETECTED NOT DETECTED Final   Coronavirus NL63 NOT DETECTED NOT DETECTED Final   Coronavirus OC43 NOT DETECTED NOT DETECTED Final   Metapneumovirus NOT DETECTED NOT DETECTED Final   Rhinovirus / Enterovirus NOT DETECTED NOT DETECTED Final   Influenza A NOT DETECTED NOT DETECTED Final   Influenza B NOT DETECTED NOT DETECTED Final   Parainfluenza Virus 1 NOT DETECTED NOT DETECTED Final   Parainfluenza Virus 2 NOT DETECTED NOT DETECTED Final   Parainfluenza Virus 3 NOT DETECTED NOT DETECTED Final   Parainfluenza Virus 4 NOT DETECTED NOT DETECTED Final   Respiratory Syncytial Virus NOT DETECTED NOT DETECTED Final   Bordetella pertussis NOT DETECTED NOT DETECTED Final   Bordetella Parapertussis NOT DETECTED NOT DETECTED Final   Chlamydophila pneumoniae NOT DETECTED NOT DETECTED Final   Mycoplasma pneumoniae NOT DETECTED NOT DETECTED Final    Comment: Performed at [facility], [address], [place], [place] [address]  Resp panel by RT-PCR (RSV, Flu A&B, Covid)     Status: None   Collection Time: 04/15/24 11:00 PM   Specimen: Nasal Swab  Result Value Ref Range Status   SARS Coronavirus 2 by RT PCR NEGATIVE NEGATIVE Final   Influenza A by PCR NEGATIVE NEGATIVE Final   Influenza B by PCR NEGATIVE NEGATIVE Final    Comment: (NOTE) The Xpert Xpress SARS-CoV-2/FLU/RSV plus assay is intended as an aid in the diagnosis of influenza from Nasopharyngeal swab specimens and should not be used as a sole basis for treatment. Nasal washings and aspirates are unacceptable for Xpert Xpress SARS-CoV-2/FLU/RSV testing.  Fact Sheet for Patients: BloggerCourse.com  Fact Sheet for Healthcare  Providers: SeriousBroker.it  This test  is not yet approved or cleared by the [place]  FDA and has been authorized for detection and/or diagnosis of SARS-CoV-2 by FDA under an Emergency Use Authorization (EUA). This EUA will remain in effect (meaning this test can be used) for the duration of the COVID-19 declaration under Section 564(b)(1) of the Act, 21 U.S.C. section 360bbb-3(b)(1), unless the authorization is terminated or revoked.     Resp Syncytial Virus by PCR NEGATIVE NEGATIVE Final    Comment: (NOTE) Fact Sheet for Patients: BloggerCourse.com  Fact Sheet for Healthcare Providers: SeriousBroker.it  This test is not yet approved or cleared by the [place]  FDA and has been authorized for detection and/or diagnosis of SARS-CoV-2 by FDA under an Emergency Use Authorization (EUA). This EUA will remain in effect (meaning this test can be used) for the duration of the COVID-19 declaration under Section 564(b)(1) of the Act, 21 U.S.C. section 360bbb-3(b)(1), unless the authorization is terminated or revoked.  Performed at [facility], [address] [place], [place] [address]    Labs: CBC: Recent Labs  Lab 04/15/24 1854 04/15/24 2321 04/16/24 0621 04/17/24 0442 04/18/24 0438  WBC 7.0  --  8.5 10.1 11.8*  HGB 13.0 12.6* 12.0* 12.4* 12.8*  HCT 43.1 37.0* 38.6* 39.5 40.7  MCV 93.1  --  90.4 89.6 90.0  PLT 368  --  304 341 422*   Basic Metabolic Panel: Recent Labs  Lab 04/15/24 1854 04/15/24 2300 04/15/24 2321 04/16/24 0621 04/17/24 0442 04/18/24 0438  NA 140 138 140 140 139 140  K 5.2* 3.9 4.0 3.8 3.8 4.5  CL 107 105  --  104 101 98  CO2 20* 22  --  26 25 29   GLUCOSE 140* 119*  --  98 94 104*  BUN 27* 28*  --  25* 29* 35*  CREATININE 1.33* 1.17  --  1.03 1.00 1.17  CALCIUM  10.2 9.8  --  9.6 9.7 10.3  MG  --  1.7  --  1.7 1.7 2.0  PHOS  --  5.5*  --  4.7* 3.3  --     Liver Function Tests: Recent Labs  Lab 04/15/24 2300 04/16/24 0621 04/17/24 0442  AST 36 25 23  ALT 38 34 32  ALKPHOS 51 43 41  BILITOT 0.7 0.9 1.0  PROT 6.6 5.8* 6.1*  ALBUMIN 2.8* 2.5* 2.6*   CBG: No results for input(s): "GLUCAP" in the last 168 hours.  Discharge time spent: greater than 30 minutes.  Author: Charlean Congress, MD  [facility] 04/18/2024

## 2024-04-18 NOTE — Plan of Care (Signed)
  Problem: Education: Goal: Knowledge of General Education information will improve Description: Including pain rating scale, medication(s)/side effects and non-pharmacologic comfort measures Outcome: Adequate for Discharge   Problem: Health Behavior/Discharge Planning: Goal: Ability to manage health-related needs will improve Outcome: Adequate for Discharge   Problem: Clinical Measurements: Goal: Ability to maintain clinical measurements within normal limits will improve Outcome: Adequate for Discharge Goal: Will remain free from infection Outcome: Adequate for Discharge Goal: Diagnostic test results will improve Outcome: Adequate for Discharge Goal: Respiratory complications will improve Outcome: Adequate for Discharge Goal: Cardiovascular complication will be avoided Outcome: Adequate for Discharge   Problem: Activity: Goal: Risk for activity intolerance will decrease Outcome: Adequate for Discharge   Problem: Nutrition: Goal: Adequate nutrition will be maintained Outcome: Adequate for Discharge   Problem: Coping: Goal: Level of anxiety will decrease Outcome: Adequate for Discharge   Problem: Elimination: Goal: Will not experience complications related to bowel motility Outcome: Adequate for Discharge Goal: Will not experience complications related to urinary retention Outcome: Adequate for Discharge   Problem: Pain Managment: Goal: General experience of comfort will improve and/or be controlled Outcome: Adequate for Discharge   Problem: Safety: Goal: Ability to remain free from injury will improve Outcome: Adequate for Discharge   Problem: Skin Integrity: Goal: Risk for impaired skin integrity will decrease Outcome: Adequate for Discharge   Problem: Acute Rehab PT Goals(only PT should resolve) Goal: Patient Will Transfer Sit To/From Stand Outcome: Adequate for Discharge Goal: Pt Will Perform Standing Balance Or Pre-Gait Outcome: Adequate for Discharge Goal:  Pt Will Ambulate Outcome: Adequate for Discharge Goal: Pt Will Go Up/Down Stairs Outcome: Adequate for Discharge   Problem: Acute Rehab OT Goals (only OT should resolve) Goal: Pt. Will Perform Lower Body Dressing Outcome: Adequate for Discharge Goal: Pt. Will Transfer To Toilet Outcome: Adequate for Discharge Goal: OT Additional ADL Goal #1 Outcome: Adequate for Discharge Goal: OT Additional ADL Goal #2 Outcome: Adequate for Discharge

## 2024-04-18 NOTE — Progress Notes (Signed)
 Mobility Specialist Progress Note;   04/18/24 0941  Mobility  Activity Ambulated with assistance in hallway  Level of Assistance Standby assist, set-up cues, supervision of patient - no hands on  Assistive Device Front wheel walker  Distance Ambulated (ft) 350 ft  Activity Response Tolerated well  Mobility Referral Yes  Mobility visit 1 Mobility  Mobility Specialist Start Time (ACUTE ONLY) 0941  Mobility Specialist Stop Time (ACUTE ONLY) 0949  Mobility Specialist Time Calculation (min) (ACUTE ONLY) 8 min   Pt eager for mobility. Required no physical assistance during ambulation, SV. VSS throughout and no c/o when asked. Eager for d/c. Pt returned back to chair with all needs met.   Janit Meline Mobility Specialist Please contact via SecureChat or [employer] [phone]

## 2024-04-18 NOTE — TOC Initial Note (Signed)
 Transition of Care [facility]) - Initial/Assessment Note    Patient Details  Name: Mario [name] MRN: [id number] Date of Birth: 08/02/1946  Transition of [facility]) CM/SW Contact:    Cosimo Diones, RN Phone Number: 04/18/2024, 3:38 PM  Clinical Narrative: Patient presented for atrial fibrillation. PTA patient states he was from home with spouse and has support of daughter. Patient states he has a nebulizer that is over 58 years old and needs a new one. Patient did not have an agency choice and is agreeable to use [employer]. Referral submitted and DME to be delivered to the discharge lounge. Patient has PCP and can get to appointments without any issues. No further home needs identified.                   Expected Discharge Plan: Home/Self Care Barriers to Discharge: No Barriers Identified   Patient Goals and CMS Choice Patient states their goals for this hospitalization and ongoing recovery are:: plan to transition home   Expected Discharge Plan and Services In-house Referral: NA Discharge Planning Services: CM Consult Post Acute Care Choice: NA Living arrangements for the past 2 months: Single Family Home Expected Discharge Date: 04/18/24               DME Arranged: Nebulizer machine DME Agency: [employer] Date DME Agency Contacted: 04/18/24 Time DME Agency Contacted: [id number] Representative spoke with at DME Agency: Zula Hitch HH Arranged: NA   Prior Living Arrangements/Services Living arrangements for the past 2 months: Single Family Home Lives with:: Spouse, Adult Children Patient language and need for interpreter reviewed:: Yes Do you feel safe going back to the place where you live?: Yes      Need for Family Participation in Patient Care: No (Comment)     Criminal Activity/Legal Involvement Pertinent to Current Situation/Hospitalization: No - Comment as needed  Activities of Daily Living   ADL Screening (condition at time of  admission) Independently performs ADLs?: Yes (appropriate for developmental age) Is the patient deaf or have difficulty hearing?: No Does the patient have difficulty seeing, even when wearing glasses/contacts?: No Does the patient have difficulty concentrating, remembering, or making decisions?: No  Permission Sought/Granted Permission sought to share information with : Family Supports, Case Production designer, theatre/television/film, Photographer granted to share info w AGENCY: [employer]        Emotional Assessment Appearance:: Appears stated age Attitude/Demeanor/Rapport: Engaged Affect (typically observed): Appropriate Orientation: : Oriented to Self, Oriented to Place, Oriented to  Time, Oriented to Situation Alcohol / Substance Use: Not Applicable Psych Involvement: No (comment)  Admission diagnosis:  Atrial fibrillation with RVR (HCC) [I48.91] Longstanding persistent atrial fibrillation (HCC) [I48.11] Acute on chronic diastolic (congestive) heart failure (HCC) [I50.33] Patient Active Problem List   Diagnosis Date Noted   Acute on chronic diastolic (congestive) heart failure (HCC) 04/17/2024   Atrial fibrillation with RVR (HCC) 04/15/2024   COPD with acute exacerbation (HCC) 04/15/2024   AKI (acute kidney injury) (HCC) 04/15/2024   Preoperative respiratory examination 04/12/2024   Atrial fibrillation with rapid ventricular response (HCC) 02/15/2024   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   Bilateral hearing loss 07/31/2023   Pain of right shoulder joint on movement 01/12/2023   History of COVID-19 12/14/2020   Healthcare maintenance 12/14/2020   Medication management 07/13/2020   Acute calculous cholecystitis 09/13/2019   Chronic anticoagulation 09/13/2019   Hiatal hernia 09/13/2019   History of colonic polyps 09/13/2019  Diverticulosis of sigmoid colon 09/13/2019   Fatty liver 09/13/2019   Gastric reflux 09/13/2019   Cholecystitis 09/13/2019   Cyst of  pancreas    Renal disorder    Coronary artery calcification seen on CAT scan    Chronic cough 08/21/2018   Chronic frontal sinusitis 07/31/2018   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   Deviated nasal septum 07/31/2018   Asthma, chronic, moderate persistent vs ACOS in former smoker  07/06/2018   Bronchiectasis without complication (HCC) 07/05/2018   Paroxysmal atrial fibrillation (HCC) 12/29/2017   Asthma with COPD (HCC)    Hyperlipidemia    Biliary colic 2014   OSA (obstructive sleep apnea)    Primary hypertension 12/24/2009   KNEE PAIN, LEFT 12/24/2009   MEDIAL MENISCUS TEAR, LEFT 12/24/2009   PCP:  Austine Lefort, MD Pharmacy:   [facility] [phone] - [place], [place] - [address] [place] [address] Phone: [phone] Fax: [phone]  Social Drivers of Health (SDOH) Social History: SDOH Screenings   Food Insecurity: No Food Insecurity (04/16/2024)  Housing: Low Risk  (04/16/2024)  Transportation Needs: No Transportation Needs (04/16/2024)  Utilities: Not At Risk (04/16/2024)  Alcohol Screen: Low Risk  (10/30/2023)  Depression (PHQ2-9): Low Risk  (11/24/2023)  Financial Resource Strain: Low Risk  (02/14/2024)  Physical Activity: Insufficiently Active (02/14/2024)  Social Connections: Socially Integrated (04/16/2024)  Stress: No Stress Concern Present (02/14/2024)  Tobacco Use: Medium Risk (04/15/2024)   SDOH Interventions:     Readmission Risk Interventions    02/18/2024   10:38 AM  Readmission Risk Prevention Plan  Post Dischage Appt Complete  Medication Screening Complete  Transportation Screening Complete

## 2024-04-18 NOTE — Progress Notes (Signed)
 Physical Therapy Treatment Patient Details Name: Mario [name] MRN: [id number] DOB: [birth year] Today's Date: 04/18/2024   History of Present Illness Patient is a 78 y/o male admitted 04/15/24 with SOB and wheezing.  Found to be in A-fib w/RVR and with COPD exacerbation.  PMH positive for A-fib w/RVR on Eliquis , CAD, HTN, HLD, COPD and bronchiectasis.    PT Comments  Today's session focused on stair training. Educated pt on technique to ascend/descend stairs based on his home set-up. He completed two flights of stairs with unilateral UE support on handrail and CGA for safety. Pt took three short standing breaks while climbing the stairs. He reported ease with descending and did not require any rest breaks. Discussed HEP for BLE to maintain AROM and increase strength for carryover into functional mobility. He ambulated ~118ft using RW with supervision. Pt denied SOB/DOE and maintained SpO2 >88% on RA throughout session. Will continue to follow acutely and advance appropriately.    If plan is discharge home, recommend the following: Help with stairs or ramp for entrance;Assistance with cooking/housework   Can travel by private vehicle        Equipment Recommendations  None recommended by PT    Recommendations for Other Services       Precautions / Restrictions Precautions Precautions: Fall Recall of Precautions/Restrictions: Intact Restrictions Weight Bearing Restrictions Per Provider Order: No     Mobility  Bed Mobility               General bed mobility comments: Not assessed. Pt greeted seated in recliner chair and returned there at end of session.    Transfers Overall transfer level: Needs assistance Equipment used: None Transfers: Sit to/from Stand Sit to Stand: Supervision           General transfer comment: Pt stood from recliner chair by pushing up with BUE support on arm rests. No physical assistance to stand. Good eccentric control with sititng.     Ambulation/Gait Ambulation/Gait assistance: Supervision Gait Distance (Feet): 150 Feet Assistive device: None, Rolling walker (2 wheels) Gait Pattern/deviations: Step-through pattern, Decreased stride length   Gait velocity interpretation: <1.8 ft/sec, indicate of risk for recurrent falls   General Gait Details: Pt ambulated within the room without AD. He utilized RW for hallway ambulation to provide increased support. He demonstrated reciprocal gait pattern, even weight shift, good foot clearence, and heel strike and push off on BLE. He navigated obstacles in room/hallway well. Pt denied SOB/DOE. His SpO2 >88% on RA. Pt reports his walking is much improved compared to before he was admitted. He declined further distance reporting a much longer walk earlier with mobility.   Stairs Stairs: Yes Stairs assistance: Contact guard assist Stair Management: One rail Right, Forwards, Step to pattern Number of Stairs: 18 General stair comments: Pt ascended leading with LLE and descended leading with RLE. He maintained unilateral UE support on railing. Pt reports he has BHR half way up his stairs. He took three standing rests while ascending, two on steps and one on the landing. He was able to descend without rest and reported it was easier. SpO2 >88% on RA.   Wheelchair Mobility     Tilt Bed    Modified Rankin (Stroke Patients Only)       Balance Overall balance assessment: Mild deficits observed, not formally tested  Communication Communication Communication: No apparent difficulties  Cognition Arousal: Alert Behavior During Therapy: WFL for tasks assessed/performed   PT - Cognitive impairments: No apparent impairments                         Following commands: Intact      Cueing Cueing Techniques: Verbal cues, Gestural cues  Exercises      General Comments General comments (skin integrity, edema, etc.):  VSS on RA. Discussed HEP of BLE exercises to maintain ROM and improve strength in order to ease stair climbing. Pt demonstrated seated exercises he previous did. PT demonstrated standing exercises including hip abd, hip ext, marches, and squats; which pt verbalized understanding and ability to complete.      Pertinent Vitals/Pain Pain Assessment Pain Assessment: No/denies pain    Home Living                          Prior Function            PT Goals (current goals can now be found in the care plan section) Acute Rehab PT Goals Patient Stated Goal: Get stronger and build my endurance Progress towards PT goals: Progressing toward goals    Frequency    Min 2X/week      PT Plan      Co-evaluation              AM-PAC PT "6 Clicks" Mobility   Outcome Measure  Help needed turning from your back to your side while in a flat bed without using bedrails?: A Little Help needed moving from lying on your back to sitting on the side of a flat bed without using bedrails?: A Little Help needed moving to and from a bed to a chair (including a wheelchair)?: A Little Help needed standing up from a chair using your arms (e.g., wheelchair or bedside chair)?: A Little Help needed to walk in hospital room?: A Little Help needed climbing 3-5 steps with a railing? : A Little 6 Click Score: 18    End of Session Equipment Utilized During Treatment: Gait belt Activity Tolerance: Patient tolerated treatment well Patient left: in chair;with call bell/phone within reach Nurse Communication: Mobility status PT Visit Diagnosis: Other abnormalities of gait and mobility (R26.89)     Time: 1150-1206 PT Time Calculation (min) (ACUTE ONLY): 16 min  Charges:    $Gait Training: 8-22 mins PT General Charges $$ ACUTE PT VISIT: 1 Visit                     Glenford Lanes, PT, DPT Acute Rehabilitation Services Office: [phone] Secure Chat Preferred  Riva Chester 04/18/2024, 2:20  PM

## 2024-04-18 NOTE — Progress Notes (Signed)
 Nutrition Education Note  RD consulted for nutrition education regarding a Heart Healthy diet.   Lipid Panel     Component Value Date/Time   CHOL 133 03/26/2021 0855   TRIG 78 03/26/2021 0855   HDL 60 03/26/2021 0855   CHOLHDL 2.2 03/26/2021 0855   VLDL 15 02/18/2016 0851   LDLCALC 57 03/26/2021 0855    RD provided "Heart Healthy Nutrition Therapy" handout from the [employer]. Reviewed patient's dietary recall. Provided examples on ways to decrease sodium and fat intake in diet. Discouraged intake of processed foods and use of salt shaker. Encouraged fresh fruits and vegetables as well as whole grain sources of carbohydrates to maximize fiber intake. Teach back method used.  He reports eating hamburgers and fries more than he should during the week. He and his wife eat out for lunch most days, and he does enjoy sweet snacks.   Discussed methods to promote sustainable behavior change.   Expect good compliance.  Body mass index is 31.19 kg/m. Pt meets criteria for obesity, class I based on current BMI.  Current diet order is heart healthy, patient is consuming approximately 100% of meals at this time. Labs and medications reviewed. No further nutrition interventions warranted at this time. RD contact information provided. If additional nutrition issues arise, please re-consult RD.  Con Decant MS, RD, LDN Registered Dietitian Clinical Nutrition RD Inpatient Contact Info in Amion

## 2024-04-18 NOTE — Progress Notes (Addendum)
 Progress Note  Patient Name: Mario [name] Date of Encounter: 04/18/2024  [facility] Cardiologist: Lauro Portal, MD   Patient Profile     Subjective   No CP.  O2 sats improving to 94% on room air  Remains atrial fibrillation but heart rate much improved  Inpatient Medications    Scheduled Meds:  apixaban   5 mg Oral BID   arformoterol   15 mcg Nebulization BID   budesonide  (PULMICORT ) nebulizer solution  0.5 mg Nebulization BID   diltiazem   60 mg Oral Q6H   guaiFENesin   600 mg Oral BID   metoprolol   200 mg Oral Daily   montelukast   10 mg Oral Daily   pantoprazole   40 mg Oral BID   predniSONE   40 mg Oral Q breakfast   rosuvastatin   20 mg Oral QPM   Continuous Infusions:  PRN Meds: acetaminophen  **OR** acetaminophen , HYDROcodone -acetaminophen , levalbuterol , melatonin, metoprolol  tartrate, ondansetron  **OR** ondansetron  (ZOFRAN ) IV   Vital Signs    Vitals:   04/17/24 2029 04/18/24 0516 04/18/24 0733 04/18/24 0735  BP:  119/82    Pulse:  89    Resp:  18    Temp:  97.6 F (36.4 C)    TempSrc:  Oral    SpO2: 95% 95% 94% 94%  Weight:      Height:        Intake/Output Summary (Last 24 hours) at 04/18/2024 0857 Last data filed at 04/17/2024 0908 Gross per 24 hour  Intake --  Output 200 ml  Net -200 ml      04/16/2024    1:15 PM 04/15/2024    6:45 PM 04/15/2024    3:38 PM  Last 3 Weights  Weight (lbs) 211 lb 3.2 oz 216 lb 216 lb 9.6 oz  Weight (kg) 95.8 kg 97.977 kg 98.249 kg      Telemetry    Atrial fibrillation controlled ventricular response- Personally Reviewed  ECG    No new EKG- Personally Reviewed  Physical Exam   GEN: Well nourished, well developed in no acute distress HEENT: Normal NECK: No JVD; No carotid bruits LYMPHATICS: No lymphadenopathy CARDIAC:RRR, no murmurs, rubs, gallops RESPIRATORY:  Clear to auscultation without rales, wheezing or rhonchi  ABDOMEN: Soft, non-tender, non-distended MUSCULOSKELETAL:  No edema; No  deformity  SKIN: Warm and dry NEUROLOGIC:  Alert and oriented x 3 PSYCHIATRIC:  Normal affect  Labs    High Sensitivity Troponin:   Recent Labs  Lab 04/15/24 1854 04/15/24 2051 04/15/24 2300  TROPONINIHS 6 7 7       Chemistry Recent Labs  Lab 04/15/24 2300 04/15/24 2321 04/16/24 0621 04/17/24 0442 04/18/24 0438  NA 138   < > 140 139 140  K 3.9   < > 3.8 3.8 4.5  CL 105  --  104 101 98  CO2 22  --  26 25 29   GLUCOSE 119*  --  98 94 104*  BUN 28*  --  25* 29* 35*  CREATININE 1.17  --  1.03 1.00 1.17  CALCIUM  9.8  --  9.6 9.7 10.3  PROT 6.6  --  5.8* 6.1*  --   ALBUMIN 2.8*  --  2.5* 2.6*  --   AST 36  --  25 23  --   ALT 38  --  34 32  --   ALKPHOS 51  --  43 41  --   BILITOT 0.7  --  0.9 1.0  --   GFRNONAA >60  --  >60 >60 >  60  ANIONGAP 11  --  10 13 13    < > = values in this interval not displayed.     Hematology Recent Labs  Lab 04/16/24 0621 04/17/24 0442 04/18/24 0438  WBC 8.5 10.1 11.8*  RBC 4.27 4.41 4.52  HGB 12.0* 12.4* 12.8*  HCT 38.6* 39.5 40.7  MCV 90.4 89.6 90.0  MCH 28.1 28.1 28.3  MCHC 31.1 31.4 31.4  RDW 17.5* 17.3* 17.2*  PLT 304 341 422*    BNP Recent Labs  Lab 04/15/24 1854  BNP 644.3*     DDimer No results for input(s): "DDIMER" in the last 168 hours.   CHA2DS2-VASc Score = 4   This indicates a 4.8% annual risk of stroke. The patient's score is based upon: CHF History: 1 HTN History: 1 Diabetes History: 0 Stroke History: 0 Vascular Disease History: 0 Age Score: 2 Gender Score: 0   Radiology    ECHOCARDIOGRAM COMPLETE Result Date: 04/16/2024    ECHOCARDIOGRAM REPORT   Patient Name:   [name] Ledger Date of Exam: 04/16/2024 Medical Rec #:  [id number]       Height:       69.0 in Accession #:    [id number]      Weight:       216.0 lb Date of Birth:  [birth year]       BSA:          2.135 m Patient Age:    78 years        BP:           123/87 mmHg Patient Gender: M               HR:           97 bpm. Exam Location:  Inpatient  Procedure: 2D Echo, Cardiac Doppler and Color Doppler (Both Spectral and Color            Flow Doppler were utilized during procedure). Indications:    A Fib  History:        Patient has prior history of Echocardiogram examinations, most                 recent 10/31/2023. Arrythmias:Atrial Fibrillation; Risk                 Factors:Hypertension.  Sonographer:    Janette Medley Referring Phys: [id number] ANASTASSIA DOUTOVA IMPRESSIONS  1. Left ventricular ejection fraction, by estimation, is 50 to 55%. The left ventricle has low normal function. Left ventricular endocardial border not optimally defined to evaluate regional wall motion. There is mild concentric left ventricular hypertrophy. Left ventricular diastolic function could not be evaluated.  2. Right ventricular systolic function is low normal. The right ventricular size is normal.  3. Left atrial size was moderately dilated.  4. The mitral valve is normal in structure. Trivial mitral valve regurgitation. No evidence of mitral stenosis.  5. The aortic valve is grossly normal. There is mild calcification of the aortic valve. There is mild thickening of the aortic valve. Aortic valve regurgitation is not visualized. Aortic valve sclerosis/calcification is present, without any evidence of aortic stenosis. Comparison(s): No significant change from prior study. Prior images reviewed side by side. FINDINGS  Left Ventricle: Left ventricular ejection fraction, by estimation, is 50 to 55%. The left ventricle has low normal function. Left ventricular endocardial border not optimally defined to evaluate regional wall motion. The left ventricular internal cavity  size was normal in size. There is mild concentric left  ventricular hypertrophy. Left ventricular diastolic function could not be evaluated due to atrial fibrillation. Left ventricular diastolic function could not be evaluated. Right Ventricle: The right ventricular size is normal. No increase in right ventricular wall  thickness. Right ventricular systolic function is low normal. Left Atrium: Left atrial size was moderately dilated. Right Atrium: Right atrial size was normal in size. Prominent Eustachian valve. Pericardium: There is no evidence of pericardial effusion. Mitral Valve: The mitral valve is normal in structure. Trivial mitral valve regurgitation. No evidence of mitral valve stenosis. Tricuspid Valve: The tricuspid valve is normal in structure. Tricuspid valve regurgitation is mild. Aortic Valve: The aortic valve is grossly normal. There is mild calcification of the aortic valve. There is mild thickening of the aortic valve. Aortic valve regurgitation is not visualized. Aortic valve sclerosis/calcification is present, without any evidence of aortic stenosis. Pulmonic Valve: The pulmonic valve was not well visualized. Pulmonic valve regurgitation is not visualized. Aorta: The aortic root and ascending aorta are structurally normal, with no evidence of dilitation. IAS/Shunts: No atrial level shunt detected by color flow Doppler.  LEFT VENTRICLE PLAX 2D LVIDd:         4.10 cm   Diastology LVIDs:         2.90 cm   LV e' medial:    8.81 cm/s LV PW:         1.40 cm   LV E/e' medial:  7.2 LV IVS:        1.20 cm   LV e' lateral:   8.16 cm/s LVOT diam:     2.10 cm   LV E/e' lateral: 7.8 LV SV:         43 LV SV Index:   20 LVOT Area:     3.46 cm  RIGHT VENTRICLE             IVC RV S prime:     10.00 cm/s  IVC diam: 1.50 cm TAPSE (M-mode): 2.1 cm LEFT ATRIUM             Index        RIGHT ATRIUM           Index LA Vol (A2C):   88.2 ml 41.32 ml/m  RA Area:     13.10 cm LA Vol (A4C):   70.3 ml 32.93 ml/m  RA Volume:   27.20 ml  12.74 ml/m LA Biplane Vol: 80.8 ml 37.85 ml/m  AORTIC VALVE LVOT Vmax:   63.60 cm/s LVOT Vmean:  44.700 cm/s LVOT VTI:    0.124 m  AORTA Ao Root diam: 3.30 cm Ao Asc diam:  3.50 cm MV E velocity: 63.82 cm/s  TRICUSPID VALVE                            TR Peak grad:   28.5 mmHg                             TR Vmax:        267.00 cm/s                             SHUNTS                            Systemic VTI:  0.12 m  Systemic Diam: 2.10 cm Luana Rumple MD Electronically signed by Luana Rumple MD Signature Date/Time: 04/16/2024/1:42:20 PM    Final     Patient Profile     78 y.o. male with a history of three vessel coronary artery calcifications noted on prior chest CTs dating back to at least 2019 but low risk Myoview  in 04/2019, persistent atrial fibrillation on Eliquis , hypertension, hyperlipidemia, obstructive sleep apnea on CPAP, bronchiectasis, and obesity admitted from the office with A-fib with RVR and shortness of breath.  Assessment & Plan    Atrial Fibrillation with RVR Patient has history of persistent atrial fibrillation with last documented episode of sinus rhythm was in 12/2022.   He was recently admitted at the end of 01/2024 for rapid atrial fibrillation and home metoprolol  was significantly increased.   Patient has a history of COPD/ asthma and bronchiectasis and has had recurrent issues for COPD/ asthma exacerbation. He is currently on antibiotics and steroids.  Presented to office with complaints of fatigue and shortness of breath and found to be in A-fib with RVR  Suspect that rapid heart rate related to COPD exacerbation as well as bronchodilators and steroid use  2D echo 04/16/24: EF 50-55%  He is not sure if he has missed a dose of Eliquis  in the last 3 weeks.  Given active COPD exacerbation would not plan TEE cardioversion at this time.  His heart rate is now adequately controlled.  Would prefer to wait 3 weeks uninterrupted Eliquis  and then bring back in as outpatient for cardioversion once he recovered from COPD exacerbation to increase probability of maintaining sinus rhythm after cardioversion  On max dose Toprol  XL 200mg  daily Will transition short acting Cardizem  to Cardizem  CD 240 mg daily   Possible Acute Diastolic CHF Shortness of  breath Patient reports significant dyspnea with minimal exertion found to have crackles and lower extremity edema He reports his weight is down 30lbs since last admission 2 months. Chest x-ray last week showed minimal bibasilar subsegmental atelectasis or scarring with minimal left pleural effusion.  Weight loss will need further workup Has not been compliant with his Lasix  that he supposed to be on 40 mg daily Echo in 10/2023 showed LVEF of 50-55% with normal wall motion and unchanged this admit TSH normal BNP mildly elevated at 644>> setting of normal chest x-ray specked BNP is primarily elevated due to acute COPD exacerbation X-ray with no edema 1 dose of Lasix  40 mg IV  put out 850 cc and now back on PO Lasix  40mg  daily Put out 500cc yesterday and net neg 1.35L since admit SCr 1 and K+ 3.8 Currently on Lasix  40 mg IV twice daily I's and O's are incomplete and weights have not been done LE edema has resolved Transition IV Lasix  to PO 40mg  daily BMET in 1 week   Coronary Artery Calcifications 3-vessel coronary artery calcifications have been noted on prior chest CTs dating back to at least 2019. Myoview  in 04/2019 was low risk.  Cardiac PET stress test was ordered at last visit to rule out dyspnea as an anginal equivalent. This is scheduled for 06/12/2023. Denies any chest pain No Aspirin  due to DOAC Continue Toprol  XL 200 mg daily and Crestor  20 mg daily  Hypertension BP well controlled at 119/82 mmHg Continue Toprol  200 mg daily  Transition short acting Cardizem  to Cardizem  CD 240 mg daily  Hyperlipidemia - Continue Crestor  20mg  daily.    Obstructive Sleep Apnea - Continue CPAP.  [facility] will sign off.  Medication Recommendations: Eliquis  5 mg twice daily, Cardizem  CD 240 mg daily, Toprol -XL 200 mg daily, Crestor  20 daily and Lasix  40 mg daily Other recommendations (labs, testing, etc): BMET 1 week Follow up as an outpatient: Dr. Katheryne Pane or extender in 7-10 days    I  spent 35 minutes caring for this patient today face to face, ordering and reviewing labs, reviewing records from 2D echo  2024 , seeing the patient, documenting in the record, and arranging for a repeat 2D echo   For questions or updates, please contact [facility] Please consult [email or web address] for contact info under        Signed, Gaylyn Keas, MD  04/18/2024, 8:57 AM

## 2024-04-19 ENCOUNTER — Ambulatory Visit: Admitting: Family Medicine

## 2024-04-19 NOTE — Progress Notes (Signed)
 Cardiology Office Note:  .   Date:  04/19/2024  ID:  Mario [name], DOB [birth year], MRN [id number] PCP: Austine Lefort, MD  [facility] Providers Cardiologist:  Lauro Portal, MD    History of Present Illness: Mario [name]   Mario [name] is a 78 y.o. male with a past medical history of coronary artery calcifications on CT scan, persistent atrial fibrillation, TN, HLD, OSA on CPAP, bronchiectasis. Patient is followed by Dr. Katheryne Pane and presents today for a hospital follow up appointment  Per chart review, patient was referred to Dr. Katheryne Pane in 12/2017 for evaluation of paroxysmal atrial fibrillation. He wore a cardiac monitor in 12/2017 that showed normal sinus rhythm with PAF. Later, patient underwent nuclear stress test in 04/2019 that was a low risk study with a very subtle reversible defect in the apex but this was felt to likely represent variation in diaphragmatic attenuation.   Echocardiogram in 10/2023 showed EF 50-55%, no regional wall motion abnormalities, mild LVH, normal RV systolic function, normal PA systolic pressure.   Patient later admitted in 01/2024 with atrial fibrillation with RVR. Initially treated with Cardizem  drip but became mildly hypotensive so it was discontinued. Home metoprolol  was increased and his albuterol  was stopped due to concern that this was causing tachycardia. Seen in clinic on 04/15/24. At that time, patient reported significant weakness and shortness of breath. Als noted orthopnea. Patient was sent to the ED for further evaluation  Admitted from 4/28-5/1 for treatment of afib with RVR, concern for COPD exacerbation, acute diastolic heart failure. He underwent echocardiogram on 04/16/24 that showed EF 50-55%, mild LVH, low normal RV systolic function. He was started on cardizem  in addition to his home metoprolol  for HR control. Treated with IV lasix . COPD exacerbation was treated with steroids, bronchodilators, antibiotics. Was ultimately discharged on diltiazem   240 mg daily, eliquis  5 mg BID, PO lasix  40 mg daily, metoprolol  succinate 200 mg daily.   Today, patient presents for a hospital follow up appointment. He is accompanied by his daughter. His daughter is concerned that he remains on metoprolol  succinate 200 mg daily. Patient has been having issues with fatigue and overall feeling poorly since his metoprolol  dose was increased in 02/2024. Daughter thought that before he was admitted to the hospital in 03/2024, the plan was to get him off metoprolol . Since being discharged from the hospital on 5/1, patient has continued to feel fatigued. Reports waking up in the AM, needing to cough, and then feeling well. After lunch, he notices some worsening fatigue and shortness of breath. Denies palpitations. Denies chest pain. He feels like he is not yet recovered from his recent COPD exacerbation   Studies Reviewed: .   Cardiac Studies & Procedures   ______________________________________________________________________________________________   STRESS TESTS  MYOCARDIAL PERFUSION IMAGING 05/15/2019  Narrative  Nuclear stress EF: 54%.  The left ventricular ejection fraction is mildly decreased (45-54%).  There was no ST segment deviation noted during stress.  There is a very subtle defect of mild severity present in the apex location. The defect is reversible but likely represents variations in diaphragmatic attenuation.  This is a low risk study.   ECHOCARDIOGRAM  ECHOCARDIOGRAM COMPLETE 04/16/2024  Narrative ECHOCARDIOGRAM REPORT    Patient Name:   Mario [name] Date of Exam: 04/16/2024 Medical Rec #:  [id number]       Height:       69.0 in Accession #:    [id number]      Weight:  216.0 lb Date of Birth:  [birth year]       BSA:          2.135 m Patient Age:    45 years        BP:           123/87 mmHg Patient Gender: M               HR:           97 bpm. Exam Location:  Inpatient  Procedure: 2D Echo, Cardiac Doppler and Color Doppler  (Both Spectral and Color Flow Doppler were utilized during procedure).  Indications:    A Fib  History:        Patient has prior history of Echocardiogram examinations, most recent 10/31/2023. Arrythmias:Atrial Fibrillation; Risk Factors:Hypertension.  Sonographer:    Janette Medley Referring Phys: [id number] ANASTASSIA DOUTOVA  IMPRESSIONS   1. Left ventricular ejection fraction, by estimation, is 50 to 55%. The left ventricle has low normal function. Left ventricular endocardial border not optimally defined to evaluate regional wall motion. There is mild concentric left ventricular hypertrophy. Left ventricular diastolic function could not be evaluated. 2. Right ventricular systolic function is low normal. The right ventricular size is normal. 3. Left atrial size was moderately dilated. 4. The mitral valve is normal in structure. Trivial mitral valve regurgitation. No evidence of mitral stenosis. 5. The aortic valve is grossly normal. There is mild calcification of the aortic valve. There is mild thickening of the aortic valve. Aortic valve regurgitation is not visualized. Aortic valve sclerosis/calcification is present, without any evidence of aortic stenosis.  Comparison(s): No significant change from prior study. Prior images reviewed side by side.  FINDINGS Left Ventricle: Left ventricular ejection fraction, by estimation, is 50 to 55%. The left ventricle has low normal function. Left ventricular endocardial border not optimally defined to evaluate regional wall motion. The left ventricular internal cavity size was normal in size. There is mild concentric left ventricular hypertrophy. Left ventricular diastolic function could not be evaluated due to atrial fibrillation. Left ventricular diastolic function could not be evaluated.  Right Ventricle: The right ventricular size is normal. No increase in right ventricular wall thickness. Right ventricular systolic function is low normal.  Left  Atrium: Left atrial size was moderately dilated.  Right Atrium: Right atrial size was normal in size. Prominent Eustachian valve.  Pericardium: There is no evidence of pericardial effusion.  Mitral Valve: The mitral valve is normal in structure. Trivial mitral valve regurgitation. No evidence of mitral valve stenosis.  Tricuspid Valve: The tricuspid valve is normal in structure. Tricuspid valve regurgitation is mild.  Aortic Valve: The aortic valve is grossly normal. There is mild calcification of the aortic valve. There is mild thickening of the aortic valve. Aortic valve regurgitation is not visualized. Aortic valve sclerosis/calcification is present, without any evidence of aortic stenosis.  Pulmonic Valve: The pulmonic valve was not well visualized. Pulmonic valve regurgitation is not visualized.  Aorta: The aortic root and ascending aorta are structurally normal, with no evidence of dilitation.  IAS/Shunts: No atrial level shunt detected by color flow Doppler.   LEFT VENTRICLE PLAX 2D LVIDd:         4.10 cm   Diastology LVIDs:         2.90 cm   LV e' medial:    8.81 cm/s LV PW:         1.40 cm   LV E/e' medial:  7.2 LV IVS:  1.20 cm   LV e' lateral:   8.16 cm/s LVOT diam:     2.10 cm   LV E/e' lateral: 7.8 LV SV:         43 LV SV Index:   20 LVOT Area:     3.46 cm   RIGHT VENTRICLE             IVC RV S prime:     10.00 cm/s  IVC diam: 1.50 cm TAPSE (M-mode): 2.1 cm  LEFT ATRIUM             Index        RIGHT ATRIUM           Index LA Vol (A2C):   88.2 ml 41.32 ml/m  RA Area:     13.10 cm LA Vol (A4C):   70.3 ml 32.93 ml/m  RA Volume:   27.20 ml  12.74 ml/m LA Biplane Vol: 80.8 ml 37.85 ml/m AORTIC VALVE LVOT Vmax:   63.60 cm/s LVOT Vmean:  44.700 cm/s LVOT VTI:    0.124 m  AORTA Ao Root diam: 3.30 cm Ao Asc diam:  3.50 cm  MV E velocity: 63.82 cm/s  TRICUSPID VALVE TR Peak grad:   28.5 mmHg TR Vmax:        267.00 cm/s  SHUNTS Systemic VTI:  0.12  m Systemic Diam: 2.10 cm  Karyl Paget Croitoru MD Electronically signed by Luana Rumple MD Signature Date/Time: 04/16/2024/1:42:20 PM    Final    MONITORS  CARDIAC EVENT MONITOR 01/08/2018  Narrative 1. NSR with PAF       ______________________________________________________________________________________________      Risk Assessment/Calculations:    CHA2DS2-VASc Score = 4   This indicates a 4.8% annual risk of stroke. The patient's score is based upon: CHF History: 1 HTN History: 1 Diabetes History: 0 Stroke History: 0 Vascular Disease History: 0 Age Score: 2 Gender Score: 0    Physical Exam:   VS:  There were no vitals taken for this visit.   Wt Readings from Last 3 Encounters:  04/16/24 211 lb 3.2 oz (95.8 kg)  04/15/24 216 lb 9.6 oz (98.2 kg)  04/12/24 217 lb 6.4 oz (98.6 kg)    GEN: Well nourished, well developed in no acute distress. Sitting comfortably in the exam chair  NECK: No JVD  CARDIAC:  Irregular rate and rhythm, radial pulses 2+ bilaterally  RESPIRATORY:  Crackles in bilateral lung bases. Expiratory wheezing throughout. Normal WOB on room air  ABDOMEN: Soft, non-tender, non-distended EXTREMITIES:  1+ edema in BLE; No deformity   ASSESSMENT AND PLAN: .    Persistent Atrial Fibrillation  - Last documented episode of sinus rhythm was in 12/2022  - Admitted in 01/2024 with rapid afib, metoprolol  increased. Again admitted 03/2024 with rapid afib in the setting of COPD exacerbation. Started on diltiazem  240 mg daily. Was not cardioverted that admission with COPD exacerbation  - HR is well controlled today in clinic. EKG demonstrates that he remains in atrial fibrillation  - Patient denies palpitations. Believes that his HR has been well controlled at home.  - He has been feeling poorly since his metoprolol  dose was increased to 200 mg daily in 01/2024. He would like to either reduce or come off this medication  - HR 72 BPM in clinic. BP low at 94/66 -  Decrease metoprolol  succinate to 100 mg daily. Continue diltiazem  240 mg daily  - Instructed patient to check his HR and BP 3 times per day  and to keep a log of his vitals. Instructed him to contact the office if his HR is sustaining >105. If HR elevated, would favor increasing diltiazem  if BP allows  - Would not be a good candidate for amiodarone with his lung issues. Could not be on flecainide with his CAD history. Likely not an ablation candidate with age and comorbidities  - Arranged close follow up. I think patient would benefit from cardioversion. However, he does not think that he is recovered enough from his COPD exacerbation. He is also pending a procedure on his eye later in May and is unsure if he needs to hold his blood thinner. Instructed him to reach out to his eye doctor to determine if this is necessary  - Continue eliquis  5 mg BID. Emphasized importance of compliance. He denies bleeding on eliquis    Diastolic heart failure  - Echocardiogram in 03/2024 showed EF 50-55%, low normal RV function - Patient has crackles in lung bases. Also has some lower extremity edema. Has some shortness of breath on exertion  - Increase lasix  to 60 mg daily for the next 3 days. Then, go back to 40 mg daily  - K 4.5 and creatinine 1.17 on 5/1. BMP collected earlier today by PCP- results pending   Coronary artery calcifications  - Has had 3-vessel coronary artery calcifications on chest Cts dating back to 2019 - Stress test in 04/2019 was a low risk study  - Pending cardiac PET - this was ordered several weeks ago for evaluation of dyspnea on exertion. Patient denies chest pain. Breathing is improving, but he continues to have some degree of DOE  - Not on ASA due to eliquis  use  - Decrease toprol -XL to 100 mg daily as above  - Continue crestor  20 mg daily   HTN  - BP in clinic 94/66. He denies dizziness, syncope, near syncope  - As above, reduce metoprolol  succinate to 100 mg daily  - Continue  diltiazem  240 mg daily  - Instructed patient to keep BP log along with HR log   HLD  - Lipid panel from 03/2021 showed LDL 57  - Repeat Lipid panel. LFTs normal 4/30 - Continue crestor  20 mg daily   OSA  - Continue CPAP    Dispo: Follow up in 1-2 weeks with APP   Signed, Debria Fang, PA-C

## 2024-04-22 NOTE — Progress Notes (Signed)
 Decreased airflow through lower lungs. He does have a very small amount of fluid in between his left lung and the sack his lung sits in. He was admitted following this for a fib and acute HF exacerbation. Looks like CXR improved. Continue follow up with cardiology as scheduled.t hanks.

## 2024-04-23 ENCOUNTER — Encounter: Admitting: Internal Medicine

## 2024-04-23 NOTE — Progress Notes (Signed)
Charge has been dropped.  

## 2024-04-26 ENCOUNTER — Ambulatory Visit: Attending: Cardiology | Admitting: Cardiology

## 2024-04-26 ENCOUNTER — Encounter: Payer: Self-pay | Admitting: Family Medicine

## 2024-04-26 ENCOUNTER — Ambulatory Visit (INDEPENDENT_AMBULATORY_CARE_PROVIDER_SITE_OTHER): Admitting: Family Medicine

## 2024-04-26 ENCOUNTER — Encounter: Payer: Self-pay | Admitting: Cardiology

## 2024-04-26 VITALS — BP 122/76 | HR 74 | Temp 97.8°F | Ht 69.0 in | Wt 211.4 lb

## 2024-04-26 VITALS — BP 94/66 | HR 72 | Ht 69.0 in | Wt 210.0 lb

## 2024-04-26 DIAGNOSIS — M6281 Muscle weakness (generalized): Secondary | ICD-10-CM | POA: Diagnosis not present

## 2024-04-26 DIAGNOSIS — I5032 Chronic diastolic (congestive) heart failure: Secondary | ICD-10-CM | POA: Diagnosis not present

## 2024-04-26 DIAGNOSIS — I110 Hypertensive heart disease with heart failure: Secondary | ICD-10-CM | POA: Diagnosis not present

## 2024-04-26 DIAGNOSIS — I10 Essential (primary) hypertension: Secondary | ICD-10-CM | POA: Diagnosis not present

## 2024-04-26 DIAGNOSIS — E785 Hyperlipidemia, unspecified: Secondary | ICD-10-CM | POA: Diagnosis not present

## 2024-04-26 DIAGNOSIS — I4819 Other persistent atrial fibrillation: Secondary | ICD-10-CM

## 2024-04-26 DIAGNOSIS — E782 Mixed hyperlipidemia: Secondary | ICD-10-CM | POA: Diagnosis not present

## 2024-04-26 DIAGNOSIS — Z008 Encounter for other general examination: Secondary | ICD-10-CM | POA: Diagnosis not present

## 2024-04-26 DIAGNOSIS — I2510 Atherosclerotic heart disease of native coronary artery without angina pectoris: Secondary | ICD-10-CM | POA: Diagnosis not present

## 2024-04-26 DIAGNOSIS — J449 Chronic obstructive pulmonary disease, unspecified: Secondary | ICD-10-CM | POA: Diagnosis not present

## 2024-04-26 DIAGNOSIS — I4891 Unspecified atrial fibrillation: Secondary | ICD-10-CM | POA: Diagnosis not present

## 2024-04-26 DIAGNOSIS — I5031 Acute diastolic (congestive) heart failure: Secondary | ICD-10-CM

## 2024-04-26 DIAGNOSIS — G4733 Obstructive sleep apnea (adult) (pediatric): Secondary | ICD-10-CM | POA: Diagnosis not present

## 2024-04-26 LAB — CBC WITH DIFFERENTIAL/PLATELET
Absolute Lymphocytes: 2261 {cells}/uL (ref 850–3900)
Absolute Monocytes: 1386 {cells}/uL — ABNORMAL HIGH (ref 200–950)
Basophils Absolute: 66 {cells}/uL (ref 0–200)
Basophils Relative: 0.4 %
Eosinophils Absolute: 66 {cells}/uL (ref 15–500)
Eosinophils Relative: 0.4 %
HCT: 44.8 % (ref 38.5–50.0)
Hemoglobin: 14.1 g/dL (ref 13.2–17.1)
MCH: 27.9 pg (ref 27.0–33.0)
MCHC: 31.5 g/dL — ABNORMAL LOW (ref 32.0–36.0)
MCV: 88.7 fL (ref 80.0–100.0)
MPV: 9.6 fL (ref 7.5–12.5)
Monocytes Relative: 8.4 %
Neutro Abs: 12722 {cells}/uL — ABNORMAL HIGH (ref 1500–7800)
Neutrophils Relative %: 77.1 %
Platelets: 462 10*3/uL — ABNORMAL HIGH (ref 140–400)
RBC: 5.05 10*6/uL (ref 4.20–5.80)
RDW: 15.2 % — ABNORMAL HIGH (ref 11.0–15.0)
Total Lymphocyte: 13.7 %
WBC: 16.5 10*3/uL — ABNORMAL HIGH (ref 3.8–10.8)

## 2024-04-26 LAB — BASIC METABOLIC PANEL WITHOUT GFR
BUN/Creatinine Ratio: 31 (calc) — ABNORMAL HIGH (ref 6–22)
BUN: 33 mg/dL — ABNORMAL HIGH (ref 7–25)
CO2: 32 mmol/L (ref 20–32)
Calcium: 9.3 mg/dL (ref 8.6–10.3)
Chloride: 101 mmol/L (ref 98–110)
Creat: 1.07 mg/dL (ref 0.70–1.28)
Glucose, Bld: 96 mg/dL (ref 65–99)
Potassium: 4.5 mmol/L (ref 3.5–5.3)
Sodium: 139 mmol/L (ref 135–146)

## 2024-04-26 MED ORDER — METOPROLOL SUCCINATE ER 100 MG PO TB24
100.0000 mg | ORAL_TABLET | Freq: Every day | ORAL | 3 refills | Status: AC
Start: 2024-04-26 — End: ?

## 2024-04-26 MED ORDER — LEVALBUTEROL TARTRATE 45 MCG/ACT IN AERO: 2.0000 | INHALATION_SPRAY | Freq: Four times a day (QID) | RESPIRATORY_TRACT | 12 refills | Status: AC | PRN

## 2024-04-26 NOTE — Patient Instructions (Addendum)
 Medication Instructions:  Decrease metoprolol  to 100 mg once a day  Increase lasix  to 80 mg (2 tablets) for the next three days then go back down to 40 mg once a day.  *If you need a refill on your cardiac medications before your next appointment, please call your pharmacy*  Lab Work: No labs If you have labs (blood work) drawn today and your tests are completely normal, you will receive your results only by: MyChart Message (if you have MyChart) OR A paper copy in the mail If you have any lab test that is abnormal or we need to change your treatment, we will call you to review the results.  Testing/Procedures: No testing  Follow-Up: At [facility], you and your health needs are our priority.  As part of our continuing mission to provide you with exceptional heart care, our providers are all part of one team.  This team includes your primary Cardiologist (physician) and Advanced Practice Providers or APPs (Physician Assistants and Nurse Practitioners) who all work together to provide you with the care you need, when you need it.  Your next appointment:   Already Scheduled   We recommend signing up for the patient portal called "MyChart".  Sign up information is provided on this After Visit Summary.  MyChart is used to connect with patients for Virtual Visits (Telemedicine).  Patients are able to view lab/test results, encounter notes, upcoming appointments, etc.  Non-urgent messages can be sent to your provider as well.   To learn more about what you can do with MyChart, go to [email or web address].   Please take your blood pressure daily for 2 weeks and send in a MyChart message. Please include heart rates. (One message at the end of the 2 weeks).   HOW TO TAKE YOUR BLOOD PRESSURE: Rest 5 minutes before taking your blood pressure. Don't smoke or drink caffeinated beverages for at least 30 minutes before. Take your blood pressure before (not after) you eat. Sit comfortably  with your back supported and both feet on the floor (don't cross your legs). Elevate your arm to heart level on a table or a desk. Use the proper sized cuff. It should fit smoothly and snugly around your bare upper arm. There should be enough room to slip a fingertip under the cuff. The bottom edge of the cuff should be 1 inch above the crease of the elbow. Ideally, take 3 measurements at one sitting and record the average.

## 2024-04-26 NOTE — Progress Notes (Signed)
 Subjective:    Patient ID: Mario [name], male    DOB: 12-17-1946, 78 y.o.   MRN: [id number] Patient was recently in hospital with atrial fibrillation with rapid ventricular response.  This was complicated by acute heart failure.  He was started on Lasix  40 mg a day.  Diltiazem  240 mg a day was added to Toprol  200 mg a day.  He is here today for follow-up.  He reports feeling extremely weak and tired.  However his heart rate today is in the 70s.  He is still in atrial fibrillation but the rate is well-controlled.  There is no evidence of hypotension or bradycardia.  He denies any syncope or near syncope.  However both lungs have bibasilar crackles concerning for pulmonary edema.  He also has trace pitting edema in both feet suggesting that he is still fluid overloaded. Past Medical History:  Diagnosis Date   Asthma    Atrial fibrillation (HCC)    Biliary colic 2014   Bronchiectasis (HCC)    2021   COPD (chronic obstructive pulmonary disease) with chronic bronchitis (HCC)    Coronary artery calcification seen on CAT scan    Cyst of pancreas    1 cm (7/18), repeat ct in 12 months.     Hyperlipidemia    Hypertension    Obstructive sleep apnea    Renal disorder    Rhinitis    Sleep apnea    pt denies sleep apnea-lost weight   Past Surgical History:  Procedure Laterality Date   COLONOSCOPY  09/29/2014   INCISION AND DRAINAGE OF WOUND Left 03/13/2024   Procedure: IRRIGATION AND DEBRIDEMENT WOUND;  Surgeon: Arvil Birks, MD;  Location: [facility] OR;  Service: Orthopedics;  Laterality: Left;  IRRIGATION AND DEBRIDEMENT FIFTH FINGER LEFT   LAPAROSCOPIC CHOLECYSTECTOMY SINGLE SITE WITH INTRAOPERATIVE CHOLANGIOGRAM N/A 09/15/2019   Procedure: LAPAROSCOPIC CHOLECYSTECTOMY SINGLE SITE;  Surgeon: Candyce Champagne, MD;  Location: [facility] ORS;  Service: General;  Laterality: N/A;   LITHOTRIPSY  2000   for kidney stone   Current Outpatient Medications on File Prior to Visit  Medication Sig Dispense Refill    acetaminophen  (TYLENOL ) 500 MG tablet Take 1,000 mg by mouth every 6 (six) hours as needed for mild pain (pain score 1-3).     apixaban  (ELIQUIS ) 5 MG TABS tablet TAKE 1 TABLET BY MOUTH TWICE A DAY 60 tablet 5   Ascorbic Acid (VITAMIN C) 1000 MG tablet Take 1,000 mg by mouth 2 (two) times daily.     benzonatate  (TESSALON ) 200 MG capsule Take 1 capsule (200 mg total) by mouth 3 (three) times daily as needed for cough. 30 capsule 1   budesonide -formoterol  (SYMBICORT ) 160-4.5 MCG/ACT inhaler Inhale 2 puffs into the lungs in the morning and at bedtime. Generic medication 1 each 6   Cholecalciferol (VITAMIN D3) 125 MCG (5000 UT) TABS Take 1 tablet by mouth 2 (two) times daily.     Coenzyme Q10 (CO Q-10) 100 MG CAPS Take 1 capsule by mouth daily.     diltiazem  (DILT-XR) 240 MG 24 hr capsule Take 1 capsule (240 mg total) by mouth daily. 30 capsule 0   FIBER COMPLETE PO Take 1 tablet by mouth daily.     furosemide  (LASIX ) 40 MG tablet Take 1 tablet (40 mg total) by mouth daily. Take additional 20 mg if your weight is 3 lbs in 1 day of 5 lbs in 1 week 30 tablet 3   guaiFENesin  (MUCINEX ) 600 MG 12 hr tablet Take 1 tablet (  600 mg total) by mouth 2 (two) times daily. 60 tablet 0   levalbuterol  (XOPENEX ) 0.63 MG/3ML nebulizer solution Take 3 mLs (0.63 mg total) by nebulization 4 (four) times daily for 7 days, THEN 3 mLs (0.63 mg total) every 6 (six) hours as needed for wheezing or shortness of breath.     metoprolol  succinate (TOPROL -XL) 100 MG 24 hr tablet Take 2 tablets (200 mg total) by mouth daily. Take with or immediately following a meal. 180 tablet 3   montelukast  (SINGULAIR ) 10 MG tablet Take 1 tablet (10 mg total) by mouth daily. 90 tablet 2   pantoprazole  (PROTONIX ) 40 MG tablet Take 1 tablet (40 mg total) by mouth 2 (two) times daily. 180 tablet 2   predniSONE  (DELTASONE ) 10 MG tablet Take 40mg  daily for 3days,Take 30mg  daily for 3days,Take 20mg  daily for 3days,Take 10mg  daily for 3days, then stop 30  tablet 0   Respiratory Therapy Supplies (FLUTTER) DEVI Use as directed 1 each 0   rosuvastatin  (CRESTOR ) 20 MG tablet TAKE 1 TABLET BY MOUTH EVERY DAY **D/C LIPITOR** (Patient taking differently: Take 20 mg by mouth every evening.) 90 tablet 2   No current facility-administered medications on file prior to visit.   Allergies  Allergen Reactions   Dust Mite Extract Other (See Comments)   Social History   Socioeconomic History   Marital status: Married    Spouse name: Not on file   Number of children: Not on file   Years of education: Not on file   Highest education level: Bachelor's degree (e.g., BA, AB, BS)  Occupational History   Not on file  Tobacco Use   Smoking status: Former    Current packs/day: 0.00    Average packs/day: 2.0 packs/day for 11.0 years (22.0 ttl pk-yrs)    Types: Cigarettes    Start date: 12/20/1963    Quit date: 12/19/1974    Years since quitting: 49.3   Smokeless tobacco: Never  Vaping Use   Vaping status: Never Used  Substance and Sexual Activity   Alcohol use: Not Currently    Alcohol/week: 1.0 standard drink of alcohol    Types: 1 Glasses of wine per week   Drug use: No   Sexual activity: Not on file  Other Topics Concern   Not on file  Social History Narrative   Not on file   Social Drivers of Health   Financial Resource Strain: Low Risk  (02/14/2024)   Overall Financial Resource Strain (CARDIA)    Difficulty of Paying Living Expenses: Not very hard  Food Insecurity: No Food Insecurity (04/16/2024)   Hunger Vital Sign    Worried About Running Out of Food in the Last Year: Never true    Ran Out of Food in the Last Year: Never true  Transportation Needs: No Transportation Needs (04/16/2024)   PRAPARE - Administrator, Civil Service (Medical): No    Lack of Transportation (Non-Medical): No  Physical Activity: Insufficiently Active (02/14/2024)   Exercise Vital Sign    Days of Exercise per Week: 1 day    Minutes of Exercise per  Session: 20 min  Stress: No Stress Concern Present (02/14/2024)   [employer] of Occupational Health - Occupational Stress Questionnaire    Feeling of Stress : Not at all  Social Connections: Socially Integrated (04/16/2024)   Social Connection and Isolation Panel [NHANES]    Frequency of Communication with Friends and Family: More than three times a week    Frequency of Social  Gatherings with Friends and Family: Three times a week    Attends Religious Services: More than 4 times per year    Active Member of Clubs or Organizations: Yes    Attends Banker Meetings: More than 4 times per year    Marital Status: Married  Catering manager Violence: Not At Risk (04/16/2024)   Humiliation, Afraid, Rape, and Kick questionnaire    Fear of Current or Ex-Partner: No    Emotionally Abused: No    Physically Abused: No    Sexually Abused: No      Review of Systems  Respiratory:  Positive for cough.   All other systems reviewed and are negative.      Objective:   Physical Exam Vitals reviewed.  Constitutional:      Appearance: He is well-developed.  HENT:     Head: Normocephalic and atraumatic.     Nose: Nose normal.     Mouth/Throat:     Pharynx: No oropharyngeal exudate.  Neck:     Vascular: No carotid bruit.  Cardiovascular:     Rate and Rhythm: Tachycardia present. Rhythm irregular.     Heart sounds: Normal heart sounds.  Pulmonary:     Effort: Pulmonary effort is normal. No tachypnea or respiratory distress.     Breath sounds: No stridor or decreased air movement. Examination of the right-lower field reveals rales. Examination of the left-lower field reveals rales. Rales present. No wheezing or rhonchi.    Musculoskeletal:     Right lower leg: Edema present.     Left lower leg: Edema present.           Assessment & Plan:  Acute diastolic congestive heart failure (HCC) - Plan: Basic Metabolic Panel Without GFR, CBC with Differential/Platelet Patient's  heart rate is well-controlled with a combination of diltiazem  and metoprolol .  However he appears to still be dealing with pulmonary edema.  I recommended increasing Lasix  to 60 mg a day with a plan to recheck the first of next week.  I believe he needs to diurese 2-3 more pounds.  His lungs are clear next week we will reduce the dose of Lasix  back to 40 mg and try to maintain a dry weight of around 207 to 208 pounds.  Monitor for hypokalemia or prerenal azotemia.  Consider adding spironolactone or Jardiance for congestive heart failure with preserved ejection fraction at a future visit once we achieve euvolemia

## 2024-04-30 ENCOUNTER — Encounter: Payer: Self-pay | Admitting: Family Medicine

## 2024-04-30 ENCOUNTER — Ambulatory Visit (INDEPENDENT_AMBULATORY_CARE_PROVIDER_SITE_OTHER): Admitting: Family Medicine

## 2024-04-30 ENCOUNTER — Telehealth: Payer: Self-pay | Admitting: *Deleted

## 2024-04-30 VITALS — BP 126/72 | HR 86 | Temp 98.4°F | Ht 69.0 in | Wt 208.8 lb

## 2024-04-30 DIAGNOSIS — I5031 Acute diastolic (congestive) heart failure: Secondary | ICD-10-CM

## 2024-04-30 DIAGNOSIS — I5032 Chronic diastolic (congestive) heart failure: Secondary | ICD-10-CM | POA: Diagnosis not present

## 2024-04-30 DIAGNOSIS — J45901 Unspecified asthma with (acute) exacerbation: Secondary | ICD-10-CM

## 2024-04-30 DIAGNOSIS — M6281 Muscle weakness (generalized): Secondary | ICD-10-CM | POA: Diagnosis not present

## 2024-04-30 MED ORDER — PREDNISONE 10 MG PO TABS: ORAL_TABLET | ORAL | 0 refills | Status: DC

## 2024-04-30 MED ORDER — LEVOFLOXACIN 500 MG PO TABS: 500.0000 mg | ORAL_TABLET | Freq: Every day | ORAL | 0 refills | Status: AC

## 2024-04-30 MED ORDER — DILTIAZEM HCL ER 240 MG PO CP24
240.0000 mg | ORAL_CAPSULE | Freq: Every day | ORAL | 1 refills | Status: DC
  Filled 2024-05-16: qty 90, 90d supply, fill #0
  Filled 2024-08-01: qty 90, 90d supply, fill #1

## 2024-04-30 MED ORDER — METHYLPREDNISOLONE ACETATE 80 MG/ML IJ SUSP
60.0000 mg | Freq: Once | INTRAMUSCULAR | Status: AC
  Administered 2024-04-30: 60 mg via INTRAMUSCULAR

## 2024-04-30 NOTE — Telephone Encounter (Signed)
 Patient with diagnosis of afib on Eliquis  for anticoagulation.    Procedure: VITRECTOMY WITH SILICONE OIL REMOVAL (RETINAL SURGERY)  Date of procedure: 05/16/24   CHA2DS2-VASc Score = 5   This indicates a 7.2% annual risk of stroke. The patient's score is based upon: CHF History: 1 HTN History: 1 Diabetes History: 0 Stroke History: 0 Vascular Disease History: 1 Age Score: 2 Gender Score: 0      CrCl 63.7 ml/min Platelet count 462  Patient has not had an Afib/aflutter ablation within the last 3 months or DCCV within the last 30 days  Per office protocol, patient can hold Eliquis  for 2-3 days prior to procedure.    **This guidance is not considered finalized until pre-operative APP has relayed final recommendations.**

## 2024-04-30 NOTE — Telephone Encounter (Signed)
   Name: Mario [name]  DOB: August 02, 1946  MRN: [id number]  Primary Cardiologist: Lauro Portal, MD  Chart reviewed as part of pre-operative protocol coverage. Because of Mario [name]'s past medical history and time since last visit, he will require a follow-up in-office visit in order to better assess preoperative cardiovascular risk.  Patient has an office visit scheduled on 05/07/2024 with Neomi Banks, NP. Appointment notes have been updated to reflect need for pre-op evaluation.   Pre-op covering staff:  - Please contact requesting surgeon's office via preferred method (i.e, phone, fax) to inform them of need for appointment prior to surgery.  This message will also be routed to pharmacy pool for input on holding Eliquis  as requested below so that this information is available to the clearing provider at time of patient's appointment.   Morey Ar, NP  04/30/2024, 8:46 AM

## 2024-04-30 NOTE — Progress Notes (Signed)
 Subjective:    Patient ID: Mario [name], male    DOB: 1946/02/24, 78 y.o.   MRN: [id number] 04/26/24 Patient was recently in hospital with atrial fibrillation with rapid ventricular response.  This was complicated by acute heart failure.  He was started on Lasix  40 mg a day.  Diltiazem  240 mg a day was added to Toprol  200 mg a day.  He is here today for follow-up.  He reports feeling extremely weak and tired.  However his heart rate today is in the 70s.  He is still in atrial fibrillation but the rate is well-controlled.  There is no evidence of hypotension or bradycardia.  He denies any syncope or near syncope.  However both lungs have bibasilar crackles concerning for pulmonary edema.  He also has trace pitting edema in both feet suggesting that he is still fluid overloaded.  At that time, my plan was: Patient's heart rate is well-controlled with a combination of diltiazem  and metoprolol .  However he appears to still be dealing with pulmonary edema.  I recommended increasing Lasix  to 60 mg a day with a plan to recheck the first of next week.  I believe he needs to diurese 2-3 more pounds.  His lungs are clear next week we will reduce the dose of Lasix  back to 40 mg and try to maintain a dry weight of around 207 to 208 pounds.  Monitor for hypokalemia or prerenal azotemia.  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            Fasting reference interval .    BUN 04/26/2024 33 (H)  7 - 25 mg/dL Final   Creat 40/98/1191 1.07  0.70 - 1.28 mg/dL Final   BUN/Creatinine Ratio 04/26/2024 31 (H)  6 - 22 (calc) Final   Sodium 04/26/2024 139  135 - 146 mmol/L Final   Potassium 04/26/2024 4.5  3.5 - 5.3 mmol/L Final   Chloride 04/26/2024 101  98 - 110 mmol/L Final   CO2  04/26/2024 32  20 - 32 mmol/L Final   Calcium  04/26/2024 9.3  8.6 - 10.3 mg/dL Final   WBC 47/82/9562 16.5 (H)  3.8 - 10.8 Thousand/uL Final   RBC 04/26/2024 5.05  4.20 - 5.80 Million/uL Final   Hemoglobin 04/26/2024 14.1  13.2 - 17.1 g/dL Final   HCT 13/07/6577 44.8  38.5 - 50.0 % Final   MCV 04/26/2024 88.7  80.0 - 100.0 fL Final   MCH 04/26/2024 27.9  27.0 - 33.0 pg Final   MCHC 04/26/2024 31.5 (L)  32.0 - 36.0 g/dL Final   Comment: For adults, a slight decrease in the calculated MCHC value (in the range of 30 to 32 g/dL) is most likely not clinically significant; however, it should be interpreted with caution in correlation with other red cell parameters and the patient's clinical condition.    RDW 04/26/2024 15.2 (H)  11.0 - 15.0 % Final   Platelets 04/26/2024 462 (H)  140 - 400 Thousand/uL Final   MPV 04/26/2024 9.6  7.5 - 12.5 fL Final   Neutro Abs 04/26/2024 12,722 (H)  1,500 - 7,800 cells/uL Final   Absolute Lymphocytes 04/26/2024 2,261  850 - 3,900 cells/uL Final   Absolute Monocytes 04/26/2024 1,386 (H)  200 -  950 cells/uL Final   Eosinophils Absolute 04/26/2024 66  15 - 500 cells/uL Final   Basophils Absolute 04/26/2024 66  0 - 200 cells/uL Final   Neutrophils Relative % 04/26/2024 77.1  % Final   Total Lymphocyte 04/26/2024 13.7  % Final   Monocytes Relative 04/26/2024 8.4  % Final   Eosinophils Relative 04/26/2024 0.4  % Final   Basophils Relative 04/26/2024 0.4  % Final   Smear Review 04/26/2024    Final   Comment: Review of peripheral smear confirms automated results.    04/30/24 Wt Readings from Last 3 Encounters:  04/30/24 208 lb 12.8 oz (94.7 kg)  04/26/24 210 lb (95.3 kg)  04/26/24 211 lb 6.4 oz (95.9 kg)   Patient has diuresed 3 pounds.  He reduced his Lasix  back to 40 mg this morning.  However he is audibly congested.  He is wheezing.  He has diffuse expiratory wheezing in all 4 lung fields.  He has prominent rhonchi in both lungs.  He has right basilar  crackles.  Patient independently took 10 mg of prednisone  at home this morning.  He reports increasing shortness of breath and "feeling plugged up." Past Medical History:  Diagnosis Date   Asthma    Atrial fibrillation (HCC)    Biliary colic 2014   Bronchiectasis (HCC)    2021   COPD (chronic obstructive pulmonary disease) with chronic bronchitis (HCC)    Coronary artery calcification seen on CAT scan    Cyst of pancreas    1 cm (7/18), repeat ct in 12 months.     Hyperlipidemia    Hypertension    Obstructive sleep apnea    Renal disorder    Rhinitis    Sleep apnea    pt denies sleep apnea-lost weight   Past Surgical History:  Procedure Laterality Date   COLONOSCOPY  09/29/2014   INCISION AND DRAINAGE OF WOUND Left 03/13/2024   Procedure: IRRIGATION AND DEBRIDEMENT WOUND;  Surgeon: Arvil Birks, MD;  Location: [facility] OR;  Service: Orthopedics;  Laterality: Left;  IRRIGATION AND DEBRIDEMENT FIFTH FINGER LEFT   LAPAROSCOPIC CHOLECYSTECTOMY SINGLE SITE WITH INTRAOPERATIVE CHOLANGIOGRAM N/A 09/15/2019   Procedure: LAPAROSCOPIC CHOLECYSTECTOMY SINGLE SITE;  Surgeon: Candyce Champagne, MD;  Location: [facility];  Service: General;  Laterality: N/A;   LITHOTRIPSY  2000   for kidney stone   Current Outpatient Medications on File Prior to Visit  Medication Sig Dispense Refill   acetaminophen  (TYLENOL ) 500 MG tablet Take 1,000 mg by mouth every 6 (six) hours as needed for mild pain (pain score 1-3).     apixaban  (ELIQUIS ) 5 MG TABS tablet TAKE 1 TABLET BY MOUTH TWICE A DAY 60 tablet 5   Ascorbic Acid (VITAMIN C) 1000 MG tablet Take 1,000 mg by mouth 2 (two) times daily.     benzonatate  (TESSALON ) 200 MG capsule Take 1 capsule (200 mg total) by mouth 3 (three) times daily as needed for cough. 30 capsule 1   budesonide -formoterol  (SYMBICORT ) 160-4.5 MCG/ACT inhaler Inhale 2 puffs into the lungs in the morning and at bedtime. Generic medication 1 each 6   Cholecalciferol (VITAMIN D3) 125 MCG (5000 UT)  TABS Take 1 tablet by mouth 2 (two) times daily.     Coenzyme Q10 (CO Q-10) 100 MG CAPS Take 1 capsule by mouth daily.     FIBER COMPLETE PO Take 1 tablet by mouth daily.     furosemide  (LASIX ) 40 MG tablet Take 1 tablet (40 mg total) by mouth daily. Take additional  20 mg if your weight is 3 lbs in 1 day of 5 lbs in 1 week 30 tablet 3   guaiFENesin  (MUCINEX ) 600 MG 12 hr tablet Take 1 tablet (600 mg total) by mouth 2 (two) times daily. 60 tablet 0   levalbuterol  (XOPENEX  HFA) 45 MCG/ACT inhaler Inhale 2 puffs into the lungs every 6 (six) hours as needed for wheezing. 1 each 12   levalbuterol  (XOPENEX ) 0.63 MG/3ML nebulizer solution Take 3 mLs (0.63 mg total) by nebulization 4 (four) times daily for 7 days, THEN 3 mLs (0.63 mg total) every 6 (six) hours as needed for wheezing or shortness of breath.     metoprolol  succinate (TOPROL -XL) 100 MG 24 hr tablet Take 1 tablet (100 mg total) by mouth daily. Take with or immediately following a meal. 90 tablet 3   montelukast  (SINGULAIR ) 10 MG tablet Take 1 tablet (10 mg total) by mouth daily. 90 tablet 2   pantoprazole  (PROTONIX ) 40 MG tablet Take 1 tablet (40 mg total) by mouth 2 (two) times daily. 180 tablet 2   predniSONE  (DELTASONE ) 10 MG tablet Take 40mg  daily for 3days,Take 30mg  daily for 3days,Take 20mg  daily for 3days,Take 10mg  daily for 3days, then stop 30 tablet 0   Respiratory Therapy Supplies (FLUTTER) DEVI Use as directed 1 each 0   rosuvastatin  (CRESTOR ) 20 MG tablet TAKE 1 TABLET BY MOUTH EVERY DAY **D/C LIPITOR** (Patient taking differently: Take 20 mg by mouth every evening.) 90 tablet 2   No current facility-administered medications on file prior to visit.   Allergies  Allergen Reactions   Dust Mite Extract Other (See Comments)   Social History   Socioeconomic History   Marital status: Married    Spouse name: Not on file   Number of children: Not on file   Years of education: Not on file   Highest education level: Bachelor's  degree (e.g., BA, AB, BS)  Occupational History   Not on file  Tobacco Use   Smoking status: Former    Current packs/day: 0.00    Average packs/day: 2.0 packs/day for 11.0 years (22.0 ttl pk-yrs)    Types: Cigarettes    Start date: 12/20/1963    Quit date: 12/19/1974    Years since quitting: 49.3   Smokeless tobacco: Never  Vaping Use   Vaping status: Never Used  Substance and Sexual Activity   Alcohol use: Not Currently    Alcohol/week: 1.0 standard drink of alcohol    Types: 1 Glasses of wine per week   Drug use: No   Sexual activity: Not on file  Other Topics Concern   Not on file  Social History Narrative   Not on file   Social Drivers of Health   Financial Resource Strain: Low Risk  (02/14/2024)   Overall Financial Resource Strain (CARDIA)    Difficulty of Paying Living Expenses: Not very hard  Food Insecurity: No Food Insecurity (04/16/2024)   Hunger Vital Sign    Worried About Running Out of Food in the Last Year: Never true    Ran Out of Food in the Last Year: Never true  Transportation Needs: No Transportation Needs (04/16/2024)   PRAPARE - Administrator, Civil Service (Medical): No    Lack of Transportation (Non-Medical): No  Physical Activity: Insufficiently Active (02/14/2024)   Exercise Vital Sign    Days of Exercise per Week: 1 day    Minutes of Exercise per Session: 20 min  Stress: No Stress Concern Present (02/14/2024)  [employer] of Occupational Health - Occupational Stress Questionnaire    Feeling of Stress : Not at all  Social Connections: Socially Integrated (04/16/2024)   Social Connection and Isolation Panel [NHANES]    Frequency of Communication with Friends and Family: More than three times a week    Frequency of Social Gatherings with Friends and Family: Three times a week    Attends Religious Services: More than 4 times per year    Active Member of Clubs or Organizations: Yes    Attends Banker Meetings: More than 4  times per year    Marital Status: Married  Catering manager Violence: Not At Risk (04/16/2024)   Humiliation, Afraid, Rape, and Kick questionnaire    Fear of Current or Ex-Partner: No    Emotionally Abused: No    Physically Abused: No    Sexually Abused: No      Review of Systems  Respiratory:  Positive for cough.   All other systems reviewed and are negative.      Objective:   Physical Exam Vitals reviewed.  Constitutional:      Appearance: He is well-developed.  HENT:     Head: Normocephalic and atraumatic.     Nose: Nose normal.     Mouth/Throat:     Pharynx: No oropharyngeal exudate.  Neck:     Vascular: No carotid bruit.  Cardiovascular:     Rate and Rhythm: Normal rate. Rhythm irregular.     Heart sounds: Normal heart sounds.  Pulmonary:     Effort: Pulmonary effort is normal. No tachypnea or respiratory distress.     Breath sounds: No stridor or decreased air movement. Examination of the right-upper field reveals wheezing. Examination of the left-upper field reveals wheezing. Examination of the right-middle field reveals wheezing and rhonchi. Examination of the left-middle field reveals wheezing and rhonchi. Examination of the right-lower field reveals wheezing, rhonchi and rales. Examination of the left-lower field reveals wheezing and rhonchi. Wheezing, rhonchi and rales present.  Musculoskeletal:     Right lower leg: Edema present.     Left lower leg: Edema present.           Assessment & Plan:  Acute diastolic congestive heart failure (HCC) - Plan: diltiazem  (DILACOR XR ) 240 MG 24 hr capsule  Persistent asthma with acute exacerbation, unspecified asthma severity - Plan: CBC with Differential/Platelet, IgE I believe the patient is euvolemic.  Continue Lasix  40 mg a day.  Although in atrial fibrillation, his heart rate is controlled in the 80s.  The patient has just recently finished prednisone .  He is now developing yet another exacerbation of his asthma.   We have been unable to since this would keep him off prednisone  and antibiotics due to recurrent exacerbations.  I truly feel the patient would benefit from a biologic therapy such as Xolair or Dupixent.  He has seen pulmonology at the end April.  They also had start steroids due to an exacerbation.  They planned on checking a CBC and IgE at his next visit to determine if he would qualify for inhaled biologic therapy.  I will check this today and hopefully facilitate getting the patient started on this medication.  Ultimately I feel that we have to find a better solution to manage his obstructive lung disease as he continues to have frequent steroid-dependent exacerbations.  Patient was given 60 mg IM of Depo-Medrol  x 1 now and will start a prolonged prednisone  taper beginning tomorrow.  He develops purulent sputum or  if he develops a high fever, I would want him to start Levaquin  500 milligrams daily for 7 days

## 2024-04-30 NOTE — Telephone Encounter (Signed)
 Please advise holding Eliquis  prior to vitrectomy with silicone oil removal on 5/29  Thank you!  DW

## 2024-04-30 NOTE — Telephone Encounter (Signed)
   Pre-operative Risk Assessment    Patient Name: Mario [name]  DOB: 1946/05/30 MRN: [id number]   Date of last office visit: 04/26/24 Liane Redman, [facility] Date of next office visit: 05/07/24 CAITLIN WALKER, NP   Request for Surgical Clearance    Procedure:  VITRECTOMY WITH SILICONE OIL REMOVAL (RETINAL SURGERY)  Date of Surgery:  Clearance 05/16/24 (URGENT)                               Surgeon:  DR. Linard Reno Surgeon's Group or Practice Name:  [facility]  Phone number:  [phone] Fax number:  [phone]   Type of Clearance Requested:   - Medical  - Pharmacy:  Hold Apixaban  (Eliquis ) x 3 DAYS PRIOR   Type of Anesthesia:  Not Indicated   Additional requests/questions:    Princ[place] Broom   04/30/2024, 8:12 AM

## 2024-05-01 LAB — CBC WITH DIFFERENTIAL/PLATELET
Absolute Lymphocytes: 2592 {cells}/uL (ref 850–3900)
Absolute Monocytes: 1256 {cells}/uL — ABNORMAL HIGH (ref 200–950)
Basophils Absolute: 64 {cells}/uL (ref 0–200)
Basophils Relative: 0.4 %
Eosinophils Absolute: 64 {cells}/uL (ref 15–500)
Eosinophils Relative: 0.4 %
HCT: 45.4 % (ref 38.5–50.0)
Hemoglobin: 14.4 g/dL (ref 13.2–17.1)
MCH: 28 pg (ref 27.0–33.0)
MCHC: 31.7 g/dL — ABNORMAL LOW (ref 32.0–36.0)
MCV: 88.2 fL (ref 80.0–100.0)
MPV: 9.7 fL (ref 7.5–12.5)
Monocytes Relative: 7.9 %
Neutro Abs: 11925 {cells}/uL — ABNORMAL HIGH (ref 1500–7800)
Neutrophils Relative %: 75 %
Platelets: 415 10*3/uL — ABNORMAL HIGH (ref 140–400)
RBC: 5.15 10*6/uL (ref 4.20–5.80)
RDW: 15.5 % — ABNORMAL HIGH (ref 11.0–15.0)
Total Lymphocyte: 16.3 %
WBC: 15.9 10*3/uL — ABNORMAL HIGH (ref 3.8–10.8)

## 2024-05-01 LAB — IGE: IgE (Immunoglobulin E), Serum: 1423 kU/L — ABNORMAL HIGH (ref <=114)

## 2024-05-02 ENCOUNTER — Ambulatory Visit: Payer: Self-pay | Admitting: Family Medicine

## 2024-05-02 DIAGNOSIS — J3089 Other allergic rhinitis: Secondary | ICD-10-CM

## 2024-05-02 DIAGNOSIS — R768 Other specified abnormal immunological findings in serum: Secondary | ICD-10-CM

## 2024-05-02 DIAGNOSIS — J4540 Moderate persistent asthma, uncomplicated: Secondary | ICD-10-CM

## 2024-05-03 NOTE — Progress Notes (Signed)
 Please let pt know that I have spoken to Dr. Waymond Hailey and he would like for him to see allergy  given allergic phenotype. I've placed a referral to Dr Kozlow. Thanks.

## 2024-05-06 ENCOUNTER — Ambulatory Visit: Payer: Self-pay

## 2024-05-06 NOTE — Telephone Encounter (Signed)
 Copied from CRM [id number]. Topic: Clinical - Red Word Triage >> May 06, 2024  8:14 AM Georgeann Kindred wrote: Red Word that prompted transfer to Nurse Triage: Patient called and reporting a asthma and chest infection. He is coughing up dark green mucus and has shortness of breath. In addition, he experiencing some chest tightness.  Chief Complaint: cough, green sputum Symptoms: cough, hx asthma, on antibiotics Frequency: since last week Pertinent Negatives: Patient denies fever, wheezing Disposition: [] ED /[] Urgent Care (no appt availability in office) / [x] Appointment(In office/virtual)/ []  [place] [facility]/ [] Home Care/ [] Refused Recommended Disposition /[] [place] [facility]/ []  Follow-up with PCP Additional Notes: per protocol apt made for tomorrow. Care advice given, denies questions; instructed to go to ER if becomes worse.   Reason for Disposition  [1] Known COPD or other severe lung disease (i.e., bronchiectasis, cystic fibrosis, lung surgery) AND [2] worsening symptoms (i.e., increased sputum purulence or amount, increased breathing difficulty  Answer Assessment - Initial Assessment Questions 1. ONSET: "When did the cough begin?"      Last week 2. SEVERITY: "How bad is the cough today?"      severe 3. SPUTUM: "Describe the color of your sputum" (none, dry cough; clear, white, yellow, green)     Green sputum 4. HEMOPTYSIS: "Are you coughing up any blood?" If so ask: "How much?" (flecks, streaks, tablespoons, etc.)     no 5. DIFFICULTY BREATHING: "Are you having difficulty breathing?" If Yes, ask: "How bad is it?" (e.g., mild, moderate, severe)    - MILD: No SOB at rest, mild SOB with walking, speaks normally in sentences, can lie down, no retractions, pulse < 100.    - MODERATE: SOB at rest, SOB with minimal exertion and prefers to sit, cannot lie down flat, speaks in phrases, mild retractions, audible wheezing, pulse 100-120.    - SEVERE: Very SOB at rest, speaks in single words,  struggling to breathe, sitting hunched forward, retractions, pulse > 120      moderate 6. FEVER: "Do you have a fever?" If Yes, ask: "What is your temperature, how was it measured, and when did it start?"     no 7. CARDIAC HISTORY: "Do you have any history of heart disease?" (e.g., heart attack, congestive heart failure)      hf 8. LUNG HISTORY: "Do you have any history of lung disease?"  (e.g., pulmonary embolus, asthma, emphysema)     asthma 9. PE RISK FACTORS: "Do you have a history of blood clots?" (or: recent major surgery, recent prolonged travel, bedridden)     no 10. OTHER SYMPTOMS: "Do you have any other symptoms?" (e.g., runny nose, wheezing, chest pain)       denies 11. PREGNANCY: "Is there any chance you are pregnant?" "When was your last menstrual period?"       na 12. TRAVEL: "Have you traveled out of the country in the last month?" (e.g., travel history, exposures)       no  Protocols used: Cough - Acute Productive-A-AH

## 2024-05-07 ENCOUNTER — Encounter (HOSPITAL_BASED_OUTPATIENT_CLINIC_OR_DEPARTMENT_OTHER): Payer: Self-pay | Admitting: Family

## 2024-05-07 ENCOUNTER — Ambulatory Visit (INDEPENDENT_AMBULATORY_CARE_PROVIDER_SITE_OTHER): Admitting: Family

## 2024-05-07 ENCOUNTER — Ambulatory Visit (INDEPENDENT_AMBULATORY_CARE_PROVIDER_SITE_OTHER): Admitting: Family Medicine

## 2024-05-07 ENCOUNTER — Encounter: Payer: Self-pay | Admitting: Family Medicine

## 2024-05-07 VITALS — BP 136/84 | HR 101 | Ht 69.0 in | Wt 211.0 lb

## 2024-05-07 VITALS — BP 100/64 | HR 110 | Ht 69.0 in | Wt 213.0 lb

## 2024-05-07 DIAGNOSIS — I4819 Other persistent atrial fibrillation: Secondary | ICD-10-CM

## 2024-05-07 DIAGNOSIS — I2510 Atherosclerotic heart disease of native coronary artery without angina pectoris: Secondary | ICD-10-CM | POA: Diagnosis not present

## 2024-05-07 DIAGNOSIS — R0602 Shortness of breath: Secondary | ICD-10-CM

## 2024-05-07 DIAGNOSIS — I10 Essential (primary) hypertension: Secondary | ICD-10-CM

## 2024-05-07 DIAGNOSIS — J4540 Moderate persistent asthma, uncomplicated: Secondary | ICD-10-CM | POA: Diagnosis not present

## 2024-05-07 DIAGNOSIS — E785 Hyperlipidemia, unspecified: Secondary | ICD-10-CM

## 2024-05-07 DIAGNOSIS — Z125 Encounter for screening for malignant neoplasm of prostate: Secondary | ICD-10-CM | POA: Diagnosis not present

## 2024-05-07 DIAGNOSIS — D6859 Other primary thrombophilia: Secondary | ICD-10-CM | POA: Diagnosis not present

## 2024-05-07 DIAGNOSIS — R768 Other specified abnormal immunological findings in serum: Secondary | ICD-10-CM | POA: Diagnosis not present

## 2024-05-07 MED ORDER — AZITHROMYCIN 250 MG PO TABS: ORAL_TABLET | ORAL | 0 refills | Status: DC

## 2024-05-07 MED ORDER — AMOXICILLIN-POT CLAVULANATE 875-125 MG PO TABS: 1.0000 | ORAL_TABLET | Freq: Two times a day (BID) | ORAL | 0 refills | Status: DC

## 2024-05-07 NOTE — H&P (View-Only) (Signed)
 Cardiology Office Note:  .   Date:  05/07/2024  ID:  Mario [name], DOB Mar 21, 1946, MRN [id number] PCP: Austine Lefort, MD  [facility] Providers Cardiologist:  Lauro Portal, MD    History of Present Illness: Mario [name]   Mario [name] is a 78 y.o. male with hx of coronary calcification on CT, HTN, HLD, OSA on CPAP, bronchiectasis, persistent atrial fibrillation.  Evaluated 12/2017 for PAF. Myoview  04/2019 low risk study. Echo 10/2023 LVEF 50-55%, no RWMA, mild LVH, normal RV function, normal PASP.   Admitted 01/2023 afib with RVR treated with Cardizem  drip which caused hypotension. Home metoprolol  increased, albuterol  stopped.   Admitted 4/28-04/18/24 for treatment of atrial fib with RVR and COPD exacerbation with HFpEF exacerbation. Echo 04/16/24 LVEF 50-55%, mild LVH, low normal RV function. Treated with IV Lasix , steroid, bronchdilator, abx. Discharged on Diltizem 240mg  daily, Elqiuis 5mg  BID, PO Lasi 40mg  daily, Toprol  200mg  daily.   Last seen 04/15/24 noting persistent fatigue which he attributed to metoprolol  dosing. His rate was controlled but remained in atrial fibrillation. Toprol  decraesed to 100mg  daily, diltizem continued at 240mg  daily. Due to volume overload, lasix  increased to 60mg  daily x 3 days then instructed to return to 40mg  daily. Cardiac PET due to DOE previously ordered is scheduled for 06/11/24.  Seen by PCP 04/30/24 with recurrent asthma flare and given 60mg  IM Depo-Medrol  and prolonged prednisone  taper. Subsequently started on Levaquin . Due to elevated IgE referred to allergy  and anticipate prescription of biologic therapy. Persistent COPD/asthma exacerbations have been closely followed by PCP and pulmonary teams.   Presents today for follow up with his daughter. He is pending vitrectomy with silicone oil removal 05/16/24. Per pharmacy review may hold Eliquis  2-3 days prior to planned procedure. On last day of Levaquin   but does not feel better and has visit with PCP  later today. Feels he is progressively weakening since hospital discharge. Heart rate at home persistently in the 90s. Considering cardioversion as discussed at last cardiology visit and delaying eye surgery. Overall LE edema is improved. Exertional dyspnea remains unchanged.    ROS: Please see the history of present illness.    All other systems reviewed and are negative.   Studies Reviewed: .        Cardiac Studies & Procedures   ______________________________________________________________________________________________   STRESS TESTS  MYOCARDIAL PERFUSION IMAGING 05/15/2019  Narrative  Nuclear stress EF: 54%.  The left ventricular ejection fraction is mildly decreased (45-54%).  There was no ST segment deviation noted during stress.  There is a very subtle defect of mild severity present in the apex location. The defect is reversible but likely represents variations in diaphragmatic attenuation.  This is a low risk study.   ECHOCARDIOGRAM  ECHOCARDIOGRAM COMPLETE 04/16/2024  Narrative ECHOCARDIOGRAM REPORT    Patient Name:   Mario [name] Date of Exam: 04/16/2024 Medical Rec #:  [id number]       Height:       69.0 in Accession #:    [id number]      Weight:       216.0 lb Date of Birth:  [birth year]       BSA:          2.135 m Patient Age:    78 years        BP:           123/87 mmHg Patient Gender: M               HR:  97 bpm. Exam Location:  Inpatient  Procedure: 2D Echo, Cardiac Doppler and Color Doppler (Both Spectral and Color Flow Doppler were utilized during procedure).  Indications:    A Fib  History:        Patient has prior history of Echocardiogram examinations, most recent 10/31/2023. Arrythmias:Atrial Fibrillation; Risk Factors:Hypertension.  Sonographer:    Janette Medley Referring Phys: [id number] ANASTASSIA DOUTOVA  IMPRESSIONS   1. Left ventricular ejection fraction, by estimation, is 50 to 55%. The left ventricle has low normal  function. Left ventricular endocardial border not optimally defined to evaluate regional wall motion. There is mild concentric left ventricular hypertrophy. Left ventricular diastolic function could not be evaluated. 2. Right ventricular systolic function is low normal. The right ventricular size is normal. 3. Left atrial size was moderately dilated. 4. The mitral valve is normal in structure. Trivial mitral valve regurgitation. No evidence of mitral stenosis. 5. The aortic valve is grossly normal. There is mild calcification of the aortic valve. There is mild thickening of the aortic valve. Aortic valve regurgitation is not visualized. Aortic valve sclerosis/calcification is present, without any evidence of aortic stenosis.  Comparison(s): No significant change from prior study. Prior images reviewed side by side.  FINDINGS Left Ventricle: Left ventricular ejection fraction, by estimation, is 50 to 55%. The left ventricle has low normal function. Left ventricular endocardial border not optimally defined to evaluate regional wall motion. The left ventricular internal cavity size was normal in size. There is mild concentric left ventricular hypertrophy. Left ventricular diastolic function could not be evaluated due to atrial fibrillation. Left ventricular diastolic function could not be evaluated.  Right Ventricle: The right ventricular size is normal. No increase in right ventricular wall thickness. Right ventricular systolic function is low normal.  Left Atrium: Left atrial size was moderately dilated.  Right Atrium: Right atrial size was normal in size. Prominent Eustachian valve.  Pericardium: There is no evidence of pericardial effusion.  Mitral Valve: The mitral valve is normal in structure. Trivial mitral valve regurgitation. No evidence of mitral valve stenosis.  Tricuspid Valve: The tricuspid valve is normal in structure. Tricuspid valve regurgitation is mild.  Aortic Valve: The  aortic valve is grossly normal. There is mild calcification of the aortic valve. There is mild thickening of the aortic valve. Aortic valve regurgitation is not visualized. Aortic valve sclerosis/calcification is present, without any evidence of aortic stenosis.  Pulmonic Valve: The pulmonic valve was not well visualized. Pulmonic valve regurgitation is not visualized.  Aorta: The aortic root and ascending aorta are structurally normal, with no evidence of dilitation.  IAS/Shunts: No atrial level shunt detected by color flow Doppler.   LEFT VENTRICLE PLAX 2D LVIDd:         4.10 cm   Diastology LVIDs:         2.90 cm   LV e' medial:    8.81 cm/s LV PW:         1.40 cm   LV E/e' medial:  7.2 LV IVS:        1.20 cm   LV e' lateral:   8.16 cm/s LVOT diam:     2.10 cm   LV E/e' lateral: 7.8 LV SV:         43 LV SV Index:   20 LVOT Area:     3.46 cm   RIGHT VENTRICLE             IVC RV S prime:     10.00 cm/s  IVC diam: 1.50 cm TAPSE (M-mode): 2.1 cm  LEFT ATRIUM             Index        RIGHT ATRIUM           Index LA Vol (A2C):   88.2 ml 41.32 ml/m  RA Area:     13.10 cm LA Vol (A4C):   70.3 ml 32.93 ml/m  RA Volume:   27.20 ml  12.74 ml/m LA Biplane Vol: 80.8 ml 37.85 ml/m AORTIC VALVE LVOT Vmax:   63.60 cm/s LVOT Vmean:  44.700 cm/s LVOT VTI:    0.124 m  AORTA Ao Root diam: 3.30 cm Ao Asc diam:  3.50 cm  MV E velocity: 63.82 cm/s  TRICUSPID VALVE TR Peak grad:   28.5 mmHg TR Vmax:        267.00 cm/s  SHUNTS Systemic VTI:  0.12 m Systemic Diam: 2.10 cm  Karyl Paget Croitoru MD Electronically signed by Luana Rumple MD Signature Date/Time: 04/16/2024/1:42:20 PM    Final    MONITORS  CARDIAC EVENT MONITOR 01/08/2018  Narrative 1. NSR with PAF       ______________________________________________________________________________________________      Risk Assessment/Calculations:    CHA2DS2-VASc Score = 5   This indicates a 7.2% annual risk of stroke. The  patient's score is based upon: CHF History: 1 HTN History: 1 Diabetes History: 0 Stroke History: 0 Vascular Disease History: 1 Age Score: 2 Gender Score: 0            Physical Exam:   VS:  BP 100/64   Pulse (!) 110   Ht 5\' 9"  (1.753 m)   Wt 213 lb (96.6 kg)   SpO2 95%   BMI 31.45 kg/m    Wt Readings from Last 3 Encounters:  05/07/24 213 lb (96.6 kg)  04/30/24 208 lb 12.8 oz (94.7 kg)  04/26/24 210 lb (95.3 kg)    GEN: Well nourished, well developed in no acute distress NECK: No JVD; No carotid bruits CARDIAC: IRIR, no murmurs, rubs, gallops RESPIRATORY:  bilateral lower lobes with inspiratory crackles, upper lobes with no adventitious lung sounds ABDOMEN: Soft, non-tender, non-distended EXTREMITIES:  Non pitting bilateral pretibial edema; No deformity   ASSESSMENT AND PLAN: .    Assessment & Plan Atrial Fibrillation / Hypercoagulable state Persistent atrial fibrillation with increased stroke risk due to CHADS-VASc score of 5. EKG today atrial fib 110 bpm despite Diltiazem  240mg  daily, Toprol  100mg  daily. Previously felt poorly on higher doses of Toprol . Home heart rate routinely in the 90s. CHA2DS2-VASc Score = 5 [CHF History: 1, HTN History: 1, Diabetes History: 0, Stroke History: 0, Vascular Disease History: 1, Age Score: 2, Gender Score: 0].  Therefore, the patient's annual risk of stroke is 7.2 %.    Cardioversion prioritized over elective eye surgery. Reviewed cardioversion procedure in detail.  - Schedule cardioversion for next week. - Ensure continuous Eliquis  use for at least four weeks post-cardioversion prior to procedure, as below.. Reports no missed doses of Eliquis .  - Follow up with electrophysiology post-cardioversion to establish care and determine long term plan for persistent atrial fibrillation (Flecainide poor choice due to CAD, Amiodarone poor choice due to COPD) -PCP kindly agreed to check CBC, BMP, BNP at visit later this afternoon  COPD/Asthma Recent  flare treated with antibiotics. Symptoms persist, prednisone  may exacerbate atrial fibrillation. Lower extremity crackles present. - Complete current course of antibiotics. - Continue prednisone  as prescribed. -follow up with PCP later today as scheduled.  Preoperative Clearance Pending elective eye surgery. Per PharmD and office protocols may hold Eliquis  2-3 days prior to procedure. However, planning for cardioversion 05/14/24 and must complete 4 weeks of OAC post cardioversion. Additionally, he has cardiac PET 06/11/24. Will defer providing clearance until cardiac PET results obtained.   Coronary calcification on CT/ HLD, LDL goal <70 Cardiac PET upcoming 06/11/24 for evaluation of exertional dyspnea to rule out ischemia and reassess LVEF. Additionally will provide imaging of lungs.  -GDMT Toprol  100mg  daily, Rosuvastatin  20mg  daily. No ASA due to OAC.  OSA CPAP compliance encouraged.   HTN  BP well controlled. Continue current antihypertensive regimen.          Informed Consent   Shared Decision Making/Informed Consent The risks (stroke, cardiac arrhythmias rarely resulting in the need for a temporary or permanent pacemaker, skin irritation or burns and complications associated with conscious sedation including aspiration, arrhythmia, respiratory failure and death), benefits (restoration of normal sinus rhythm) and alternatives of a direct current cardioversion were explained in detail to Mr. Andujar and he agrees to proceed.       Dispo: follow up 2-3 weeks after cardioversion  Signed, Clearnce Curia, NP

## 2024-05-07 NOTE — Patient Instructions (Signed)
 Medication Instructions:  Your physician recommends that you continue on your current medications as directed. Please refer to the Current Medication list given to you today.  *If you need a refill on your cardiac medications before your next appointment, please call your pharmacy*  Lab Work: BMP, BNP, & CBC with Dr. Cheril Cork today- Jerrold Morgan will send a message    Testing/Procedures: We will send clearance for procedure for 4 weeks after Cardioversion    You are scheduled for a Cardioversion on Tuesday, May 27 with Dr. Albert Huff.  Please arrive at the [facility] (Main Entrance A) at [facility]: [address] [place], [place] [address] at 10:00 AM (This time is 1 hour(s) before your procedure to ensure your preparation).   Free valet parking service is available. You will check in at ADMITTING.   *Please Note: You will receive a call the day before your procedure to confirm the appointment time. That time may have changed from the original time based on the schedule for that day.*    DIET:  Nothing to eat or drink after midnight except a sip of water  with medications (see medication instructions below)  MEDICATION INSTRUCTIONS: Continue taking your anticoagulant (blood thinner): Apixaban  (Eliquis ).  You will need to continue this after your procedure until you are told by your provider that it is safe to stop.    FYI:  For your safety, and to allow us  to monitor your vital signs accurately during the surgery/procedure we request: If you have artificial nails, gel coating, SNS etc, please have those removed prior to your surgery/procedure. Not having the nail coverings /polish removed may result in cancellation or delay of your surgery/procedure.  Your support person will be asked to wait in the waiting room during your procedure.  It is OK to have someone drop you off and come back when you are ready to be discharged.  You cannot drive after the procedure and will need someone to  drive you home.  Bring your insurance cards.  *Special Note: Every effort is made to have your procedure done on time. Occasionally there are emergencies that occur at the hospital that may cause delays. Please be patient if a delay does occur.    Follow-Up: At [facility], you and your health needs are our priority.  As part of our continuing mission to provide you with exceptional heart care, our providers are all part of one team.  This team includes your primary Cardiologist (physician) and Advanced Practice Providers or APPs (Physician Assistants and Nurse Practitioners) who all work together to provide you with the care you need, when you need it.  Your next appointment:   2-3 weeks post Cardioversion with Dr. Katheryne Pane or APP

## 2024-05-07 NOTE — Progress Notes (Signed)
 Cardiology Office Note:  .   Date:  05/07/2024  ID:  Mario [name], DOB Mar 21, 1946, MRN [id number] PCP: Austine Lefort, MD  [facility] Providers Cardiologist:  Lauro Portal, MD    History of Present Illness: Mario [name]   TAUREAN [name] is a 78 y.o. male with hx of coronary calcification on CT, HTN, HLD, OSA on CPAP, bronchiectasis, persistent atrial fibrillation.  Evaluated 12/2017 for PAF. Myoview  04/2019 low risk study. Echo 10/2023 LVEF 50-55%, no RWMA, mild LVH, normal RV function, normal PASP.   Admitted 01/2023 afib with RVR treated with Cardizem  drip which caused hypotension. Home metoprolol  increased, albuterol  stopped.   Admitted 4/28-04/18/24 for treatment of atrial fib with RVR and COPD exacerbation with HFpEF exacerbation. Echo 04/16/24 LVEF 50-55%, mild LVH, low normal RV function. Treated with IV Lasix , steroid, bronchdilator, abx. Discharged on Diltizem 240mg  daily, Elqiuis 5mg  BID, PO Lasi 40mg  daily, Toprol  200mg  daily.   Last seen 04/15/24 noting persistent fatigue which he attributed to metoprolol  dosing. His rate was controlled but remained in atrial fibrillation. Toprol  decraesed to 100mg  daily, diltizem continued at 240mg  daily. Due to volume overload, lasix  increased to 60mg  daily x 3 days then instructed to return to 40mg  daily. Cardiac PET due to DOE previously ordered is scheduled for 06/11/24.  Seen by PCP 04/30/24 with recurrent asthma flare and given 60mg  IM Depo-Medrol  and prolonged prednisone  taper. Subsequently started on Levaquin . Due to elevated IgE referred to allergy  and anticipate prescription of biologic therapy. Persistent COPD/asthma exacerbations have been closely followed by PCP and pulmonary teams.   Presents today for follow up with his daughter. He is pending vitrectomy with silicone oil removal 05/16/24. Per pharmacy review may hold Eliquis  2-3 days prior to planned procedure. On last day of Levaquin   but does not feel better and has visit with PCP  later today. Feels he is progressively weakening since hospital discharge. Heart rate at home persistently in the 90s. Considering cardioversion as discussed at last cardiology visit and delaying eye surgery. Overall LE edema is improved. Exertional dyspnea remains unchanged.    ROS: Please see the history of present illness.    All other systems reviewed and are negative.   Studies Reviewed: .        Cardiac Studies & Procedures   ______________________________________________________________________________________________   STRESS TESTS  MYOCARDIAL PERFUSION IMAGING 05/15/2019  Narrative  Nuclear stress EF: 54%.  The left ventricular ejection fraction is mildly decreased (45-54%).  There was no ST segment deviation noted during stress.  There is a very subtle defect of mild severity present in the apex location. The defect is reversible but likely represents variations in diaphragmatic attenuation.  This is a low risk study.   ECHOCARDIOGRAM  ECHOCARDIOGRAM COMPLETE 04/16/2024  Narrative ECHOCARDIOGRAM REPORT    Patient Name:   Mario [name] Glassco Date of Exam: 04/16/2024 Medical Rec #:  [id number]       Height:       69.0 in Accession #:    [id number]      Weight:       216.0 lb Date of Birth:  [birth year]       BSA:          2.135 m Patient Age:    78 years        BP:           123/87 mmHg Patient Gender: M               HR:  97 bpm. Exam Location:  Inpatient  Procedure: 2D Echo, Cardiac Doppler and Color Doppler (Both Spectral and Color Flow Doppler were utilized during procedure).  Indications:    A Fib  History:        Patient has prior history of Echocardiogram examinations, most recent 10/31/2023. Arrythmias:Atrial Fibrillation; Risk Factors:Hypertension.  Sonographer:    Mario [name] Referring Phys: [id number] ANASTASSIA DOUTOVA  IMPRESSIONS   1. Left ventricular ejection fraction, by estimation, is 50 to 55%. The left ventricle has low normal  function. Left ventricular endocardial border not optimally defined to evaluate regional wall motion. There is mild concentric left ventricular hypertrophy. Left ventricular diastolic function could not be evaluated. 2. Right ventricular systolic function is low normal. The right ventricular size is normal. 3. Left atrial size was moderately dilated. 4. The mitral valve is normal in structure. Trivial mitral valve regurgitation. No evidence of mitral stenosis. 5. The aortic valve is grossly normal. There is mild calcification of the aortic valve. There is mild thickening of the aortic valve. Aortic valve regurgitation is not visualized. Aortic valve sclerosis/calcification is present, without any evidence of aortic stenosis.  Comparison(s): No significant change from prior study. Prior images reviewed side by side.  FINDINGS Left Ventricle: Left ventricular ejection fraction, by estimation, is 50 to 55%. The left ventricle has low normal function. Left ventricular endocardial border not optimally defined to evaluate regional wall motion. The left ventricular internal cavity size was normal in size. There is mild concentric left ventricular hypertrophy. Left ventricular diastolic function could not be evaluated due to atrial fibrillation. Left ventricular diastolic function could not be evaluated.  Right Ventricle: The right ventricular size is normal. No increase in right ventricular wall thickness. Right ventricular systolic function is low normal.  Left Atrium: Left atrial size was moderately dilated.  Right Atrium: Right atrial size was normal in size. Prominent Eustachian valve.  Pericardium: There is no evidence of pericardial effusion.  Mitral Valve: The mitral valve is normal in structure. Trivial mitral valve regurgitation. No evidence of mitral valve stenosis.  Tricuspid Valve: The tricuspid valve is normal in structure. Tricuspid valve regurgitation is mild.  Aortic Valve: The  aortic valve is grossly normal. There is mild calcification of the aortic valve. There is mild thickening of the aortic valve. Aortic valve regurgitation is not visualized. Aortic valve sclerosis/calcification is present, without any evidence of aortic stenosis.  Pulmonic Valve: The pulmonic valve was not well visualized. Pulmonic valve regurgitation is not visualized.  Aorta: The aortic root and ascending aorta are structurally normal, with no evidence of dilitation.  IAS/Shunts: No atrial level shunt detected by color flow Doppler.   LEFT VENTRICLE PLAX 2D LVIDd:         4.10 cm   Diastology LVIDs:         2.90 cm   LV e' medial:    8.81 cm/s LV PW:         1.40 cm   LV E/e' medial:  7.2 LV IVS:        1.20 cm   LV e' lateral:   8.16 cm/s LVOT diam:     2.10 cm   LV E/e' lateral: 7.8 LV SV:         43 LV SV Index:   20 LVOT Area:     3.46 cm   RIGHT VENTRICLE             IVC RV S prime:     10.00 cm/s  IVC diam: 1.50 cm TAPSE (M-mode): 2.1 cm  LEFT ATRIUM             Index        RIGHT ATRIUM           Index LA Vol (A2C):   88.2 ml 41.32 ml/m  RA Area:     13.10 cm LA Vol (A4C):   70.3 ml 32.93 ml/m  RA Volume:   27.20 ml  12.74 ml/m LA Biplane Vol: 80.8 ml 37.85 ml/m AORTIC VALVE LVOT Vmax:   63.60 cm/s LVOT Vmean:  44.700 cm/s LVOT VTI:    0.124 m  AORTA Ao Root diam: 3.30 cm Ao Asc diam:  3.50 cm  MV E velocity: 63.82 cm/s  TRICUSPID VALVE TR Peak grad:   28.5 mmHg TR Vmax:        267.00 cm/s  SHUNTS Systemic VTI:  0.12 m Systemic Diam: 2.10 cm  Karyl Paget Croitoru MD Electronically signed by Luana Rumple MD Signature Date/Time: 04/16/2024/1:42:20 PM    Final    MONITORS  CARDIAC EVENT MONITOR 01/08/2018  Narrative 1. NSR with PAF       ______________________________________________________________________________________________      Risk Assessment/Calculations:    CHA2DS2-VASc Score = 5   This indicates a 7.2% annual risk of stroke. The  patient's score is based upon: CHF History: 1 HTN History: 1 Diabetes History: 0 Stroke History: 0 Vascular Disease History: 1 Age Score: 2 Gender Score: 0            Physical Exam:   VS:  BP 100/64   Pulse (!) 110   Ht 5\' 9"  (1.753 m)   Wt 213 lb (96.6 kg)   SpO2 95%   BMI 31.45 kg/m    Wt Readings from Last 3 Encounters:  05/07/24 213 lb (96.6 kg)  04/30/24 208 lb 12.8 oz (94.7 kg)  04/26/24 210 lb (95.3 kg)    GEN: Well nourished, well developed in no acute distress NECK: No JVD; No carotid bruits CARDIAC: IRIR, no murmurs, rubs, gallops RESPIRATORY:  bilateral lower lobes with inspiratory crackles, upper lobes with no adventitious lung sounds ABDOMEN: Soft, non-tender, non-distended EXTREMITIES:  Non pitting bilateral pretibial edema; No deformity   ASSESSMENT AND PLAN: .    Assessment & Plan Atrial Fibrillation / Hypercoagulable state Persistent atrial fibrillation with increased stroke risk due to CHADS-VASc score of 5. EKG today atrial fib 110 bpm despite Diltiazem  240mg  daily, Toprol  100mg  daily. Previously felt poorly on higher doses of Toprol . Home heart rate routinely in the 90s. CHA2DS2-VASc Score = 5 [CHF History: 1, HTN History: 1, Diabetes History: 0, Stroke History: 0, Vascular Disease History: 1, Age Score: 2, Gender Score: 0].  Therefore, the patient's annual risk of stroke is 7.2 %.    Cardioversion prioritized over elective eye surgery. Reviewed cardioversion procedure in detail.  - Schedule cardioversion for next week. - Ensure continuous Eliquis  use for at least four weeks post-cardioversion prior to procedure, as below.. Reports no missed doses of Eliquis .  - Follow up with electrophysiology post-cardioversion to establish care and determine long term plan for persistent atrial fibrillation (Flecainide poor choice due to CAD, Amiodarone poor choice due to COPD) -PCP kindly agreed to check CBC, BMP, BNP at visit later this afternoon  COPD/Asthma Recent  flare treated with antibiotics. Symptoms persist, prednisone  may exacerbate atrial fibrillation. Lower extremity crackles present. - Complete current course of antibiotics. - Continue prednisone  as prescribed. -follow up with PCP later today as scheduled.  Preoperative Clearance Pending elective eye surgery. Per PharmD and office protocols may hold Eliquis  2-3 days prior to procedure. However, planning for cardioversion 05/14/24 and must complete 4 weeks of OAC post cardioversion. Additionally, he has cardiac PET 06/11/24. Will defer providing clearance until cardiac PET results obtained.   Coronary calcification on CT/ HLD, LDL goal <70 Cardiac PET upcoming 06/11/24 for evaluation of exertional dyspnea to rule out ischemia and reassess LVEF. Additionally will provide imaging of lungs.  -GDMT Toprol  100mg  daily, Rosuvastatin  20mg  daily. No ASA due to OAC.  OSA CPAP compliance encouraged.   HTN  BP well controlled. Continue current antihypertensive regimen.          Informed Consent   Shared Decision Making/Informed Consent The risks (stroke, cardiac arrhythmias rarely resulting in the need for a temporary or permanent pacemaker, skin irritation or burns and complications associated with conscious sedation including aspiration, arrhythmia, respiratory failure and death), benefits (restoration of normal sinus rhythm) and alternatives of a direct current cardioversion were explained in detail to Mr. Andujar and he agrees to proceed.       Dispo: follow up 2-3 weeks after cardioversion  Signed, Clearnce Curia, NP

## 2024-05-07 NOTE — Progress Notes (Signed)
 Subjective:    Patient ID: Mario [name], male    DOB: 01/28/1946, 78 y.o.   MRN: [id number] 04/26/24 Patient was recently in hospital with atrial fibrillation with rapid ventricular response.  This was complicated by acute heart failure.  He was started on Lasix  40 mg a day.  Diltiazem  240 mg a day was added to Toprol  200 mg a day.  He is here today for follow-up.  He reports feeling extremely weak and tired.  However his heart rate today is in the 70s.  He is still in atrial fibrillation but the rate is well-controlled.  There is no evidence of hypotension or bradycardia.  He denies any syncope or near syncope.  However both lungs have bibasilar crackles concerning for pulmonary edema.  He also has trace pitting edema in both feet suggesting that he is still fluid overloaded.  At that time, my plan was: Patient's heart rate is well-controlled with a combination of diltiazem  and metoprolol .  However he appears to still be dealing with pulmonary edema.  I recommended increasing Lasix  to 60 mg a day with a plan to recheck the first of next week.  I believe he needs to diurese 2-3 more pounds.  His lungs are clear next week we will reduce the dose of Lasix  back to 40 mg and try to maintain a dry weight of around 207 to 208 pounds.  Monitor for hypokalemia or prerenal azotemia.  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  Latest known visit with results is:  Office Visit on 04/30/2024  Component Date Value Ref Range Status   WBC 04/30/2024 15.9 (H)  3.8 - 10.8 Thousand/uL Final   RBC 04/30/2024 5.15  4.20 - 5.80 Million/uL Final   Hemoglobin 04/30/2024 14.4  13.2 - 17.1 g/dL Final   HCT 40/98/1191 45.4  38.5 - 50.0 % Final   MCV 04/30/2024 88.2  80.0 - 100.0 fL Final   MCH 04/30/2024 28.0  27.0 - 33.0 pg Final   MCHC 04/30/2024 31.7 (L)   32.0 - 36.0 g/dL Final   Comment: For adults, a slight decrease in the calculated MCHC value (in the range of 30 to 32 g/dL) is most likely not clinically significant; however, it should be interpreted with caution in correlation with other red cell parameters and the patient's clinical condition.    RDW 04/30/2024 15.5 (H)  11.0 - 15.0 % Final   Platelets 04/30/2024 415 (H)  140 - 400 Thousand/uL Final   MPV 04/30/2024 9.7  7.5 - 12.5 fL Final   Neutro Abs 04/30/2024 11,925 (H)  1,500 - 7,800 cells/uL Final   Absolute Lymphocytes 04/30/2024 2,592  850 - 3,900 cells/uL Final   Absolute Monocytes 04/30/2024 1,256 (H)  200 - 950 cells/uL Final   Eosinophils Absolute 04/30/2024 64  15 - 500 cells/uL Final   Basophils Absolute 04/30/2024 64  0 - 200 cells/uL Final   Neutrophils Relative % 04/30/2024 75.0  % Final   Total Lymphocyte 04/30/2024 16.3  % Final   Monocytes Relative 04/30/2024 7.9  % Final   Eosinophils Relative 04/30/2024 0.4  % Final   Basophils Relative 04/30/2024 0.4  % Final   Smear Review 04/30/2024    Final   Comment: Review of peripheral smear confirms automated results.    IgE (Immunoglobulin E), Serum 04/30/2024 1,423 (H)  <OR=114  kU/L Final   04/30/24 Wt Readings from Last 3 Encounters:  05/07/24 211 lb (95.7 kg)  05/07/24 213 lb (96.6 kg)  04/30/24 208 lb 12.8 oz (94.7 kg)   Patient has diuresed 3 pounds.  He reduced his Lasix  back to 40 mg this morning.  However he is audibly congested.  He is wheezing.  He has diffuse expiratory wheezing in all 4 lung fields.  He has prominent rhonchi in both lungs.  He has right basilar crackles.  Patient independently took 10 mg of prednisone  at home this morning.  He reports increasing shortness of breath and "feeling plugged up."  At that time, my plan was: I believe the patient is euvolemic.  Continue Lasix  40 mg a day.  Although in atrial fibrillation, his heart rate is controlled in the 80s.  The patient has just recently  finished prednisone .  He is now developing yet another exacerbation of his asthma.  We have been unable to since this would keep him off prednisone  and antibiotics due to recurrent exacerbations.  I truly feel the patient would benefit from a biologic therapy such as Xolair or Dupixent.  He has seen pulmonology at the end April.  They also had start steroids due to an exacerbation.  They planned on checking a CBC and IgE at his next visit to determine if he would qualify for inhaled biologic therapy.  I will check this today and hopefully facilitate getting the patient started on this medication.  Ultimately I feel that we have to find a better solution to manage his obstructive lung disease as he continues to have frequent steroid-dependent exacerbations.  Patient was given 60 mg IM of Depo-Medrol  x 1 now and will start a prolonged prednisone  taper beginning tomorrow.  He develops purulent sputum or if he develops a high fever, I would want him to start Levaquin  500 milligrams daily for 7 days  05/07/24 Patient's weight remains stable.  There is minimal or trace edema in his legs.  There is no sign of fluid overload.  His wheezing has improved dramatically from last week.  I no longer appreciate any wheezing however he continues to have rhonchorous breath sounds in all 4 lung fields and bibasilar Rales.  He reports a cough productive of green mucus despite taking Levaquin .  He reports shortness of breath.  He is concerned because he is scheduled to have cardioversion on Tuesday of next week and he wants to make sure the infection has totally resolved.  He did take the Levaquin  that I prescribed to him last week and he states that he is feeling better however his lungs today still sound extremely congested.  He continues to have purulent sputum. Past Medical History:  Diagnosis Date   Asthma    Atrial fibrillation (HCC)    Biliary colic 2014   Bronchiectasis (HCC)    2021   COPD (chronic obstructive  pulmonary disease) with chronic bronchitis (HCC)    Coronary artery calcification seen on CAT scan    Cyst of pancreas    1 cm (7/18), repeat ct in 12 months.     Hyperlipidemia    Hypertension    Obstructive sleep apnea    Renal disorder    Rhinitis    Sleep apnea    pt denies sleep apnea-lost weight   Past Surgical History:  Procedure Laterality Date   COLONOSCOPY  09/29/2014   INCISION AND DRAINAGE OF WOUND Left 03/13/2024   Procedure: IRRIGATION AND DEBRIDEMENT WOUND;  Surgeon:  Arvil Birks, MD;  Location: [facility] OR;  Service: Orthopedics;  Laterality: Left;  IRRIGATION AND DEBRIDEMENT FIFTH FINGER LEFT   LAPAROSCOPIC CHOLECYSTECTOMY SINGLE SITE WITH INTRAOPERATIVE CHOLANGIOGRAM N/A 09/15/2019   Procedure: LAPAROSCOPIC CHOLECYSTECTOMY SINGLE SITE;  Surgeon: Candyce Champagne, MD;  Location: [facility] ORS;  Service: General;  Laterality: N/A;   LITHOTRIPSY  2000   for kidney stone   Current Outpatient Medications on File Prior to Visit  Medication Sig Dispense Refill   acetaminophen  (TYLENOL ) 500 MG tablet Take 1,000 mg by mouth every 6 (six) hours as needed for mild pain (pain score 1-3).     apixaban  (ELIQUIS ) 5 MG TABS tablet TAKE 1 TABLET BY MOUTH TWICE A DAY 60 tablet 5   Ascorbic Acid (VITAMIN C) 1000 MG tablet Take 1,000 mg by mouth 2 (two) times daily.     benzonatate  (TESSALON ) 200 MG capsule Take 1 capsule (200 mg total) by mouth 3 (three) times daily as needed for cough. 30 capsule 1   budesonide -formoterol  (SYMBICORT ) 160-4.5 MCG/ACT inhaler Inhale 2 puffs into the lungs in the morning and at bedtime. Generic medication 1 each 6   Cholecalciferol (VITAMIN D3) 125 MCG (5000 UT) TABS Take 1 tablet by mouth 2 (two) times daily.     Coenzyme Q10 (CO Q-10) 100 MG CAPS Take 1 capsule by mouth daily.     diltiazem  (DILACOR XR ) 240 MG 24 hr capsule Take 1 capsule (240 mg total) by mouth daily. 90 capsule 1   FIBER COMPLETE PO Take 1 tablet by mouth daily.     furosemide  (LASIX ) 40 MG tablet  Take 1 tablet (40 mg total) by mouth daily. Take additional 20 mg if your weight is 3 lbs in 1 day of 5 lbs in 1 week 30 tablet 3   guaiFENesin  (MUCINEX ) 600 MG 12 hr tablet Take 1 tablet (600 mg total) by mouth 2 (two) times daily. 60 tablet 0   levalbuterol  (XOPENEX  HFA) 45 MCG/ACT inhaler Inhale 2 puffs into the lungs every 6 (six) hours as needed for wheezing. 1 each 12   levalbuterol  (XOPENEX ) 0.63 MG/3ML nebulizer solution Take 3 mLs (0.63 mg total) by nebulization 4 (four) times daily for 7 days, THEN 3 mLs (0.63 mg total) every 6 (six) hours as needed for wheezing or shortness of breath.     levofloxacin  (LEVAQUIN ) 500 MG tablet Take 1 tablet (500 mg total) by mouth daily for 7 days. 7 tablet 0   metoprolol  succinate (TOPROL -XL) 100 MG 24 hr tablet Take 1 tablet (100 mg total) by mouth daily. Take with or immediately following a meal. 90 tablet 3   montelukast  (SINGULAIR ) 10 MG tablet Take 1 tablet (10 mg total) by mouth daily. 90 tablet 2   pantoprazole  (PROTONIX ) 40 MG tablet Take 1 tablet (40 mg total) by mouth 2 (two) times daily. 180 tablet 2   predniSONE  (DELTASONE ) 10 MG tablet Take 40mg  daily for 3days,Take 30mg  daily for 3days,Take 20mg  daily for 3days,Take 10mg  daily for 3days, then stop 30 tablet 0   Respiratory Therapy Supplies (FLUTTER) DEVI Use as directed 1 each 0   rosuvastatin  (CRESTOR ) 20 MG tablet TAKE 1 TABLET BY MOUTH EVERY DAY **D/C LIPITOR** (Patient taking differently: Take 20 mg by mouth every evening.) 90 tablet 2   No current facility-administered medications on file prior to visit.   Allergies  Allergen Reactions   Dust Mite Extract Other (See Comments)   Social History   Socioeconomic History   Marital status: Married  Spouse name: Not on file   Number of children: Not on file   Years of education: Not on file   Highest education level: Bachelor's degree (e.g., BA, AB, BS)  Occupational History   Not on file  Tobacco Use   Smoking status: Former     Current packs/day: 0.00    Average packs/day: 2.0 packs/day for 11.0 years (22.0 ttl pk-yrs)    Types: Cigarettes    Start date: 12/20/1963    Quit date: 12/19/1974    Years since quitting: 49.4   Smokeless tobacco: Never  Vaping Use   Vaping status: Never Used  Substance and Sexual Activity   Alcohol use: Not Currently    Alcohol/week: 1.0 standard drink of alcohol    Types: 1 Glasses of wine per week   Drug use: No   Sexual activity: Not on file  Other Topics Concern   Not on file  Social History Narrative   Not on file   Social Drivers of Health   Financial Resource Strain: Low Risk  (02/14/2024)   Overall Financial Resource Strain (CARDIA)    Difficulty of Paying Living Expenses: Not very hard  Food Insecurity: No Food Insecurity (04/16/2024)   Hunger Vital Sign    Worried About Running Out of Food in the Last Year: Never true    Ran Out of Food in the Last Year: Never true  Transportation Needs: No Transportation Needs (04/16/2024)   PRAPARE - Administrator, Civil Service (Medical): No    Lack of Transportation (Non-Medical): No  Physical Activity: Insufficiently Active (02/14/2024)   Exercise Vital Sign    Days of Exercise per Week: 1 day    Minutes of Exercise per Session: 20 min  Stress: No Stress Concern Present (02/14/2024)   [employer] of Occupational Health - Occupational Stress Questionnaire    Feeling of Stress : Not at all  Social Connections: Socially Integrated (04/16/2024)   Social Connection and Isolation Panel [NHANES]    Frequency of Communication with Friends and Family: More than three times a week    Frequency of Social Gatherings with Friends and Family: Three times a week    Attends Religious Services: More than 4 times per year    Active Member of Clubs or Organizations: Yes    Attends Banker Meetings: More than 4 times per year    Marital Status: Married  Catering manager Violence: Not At Risk (04/16/2024)    Humiliation, Afraid, Rape, and Kick questionnaire    Fear of Current or Ex-Partner: No    Emotionally Abused: No    Physically Abused: No    Sexually Abused: No      Review of Systems  Respiratory:  Positive for cough.   All other systems reviewed and are negative.      Objective:   Physical Exam Vitals reviewed.  Constitutional:      Appearance: He is well-developed.  HENT:     Head: Normocephalic and atraumatic.     Nose: Nose normal.     Mouth/Throat:     Pharynx: No oropharyngeal exudate.  Neck:     Vascular: No carotid bruit.  Cardiovascular:     Rate and Rhythm: Normal rate. Rhythm irregular.     Heart sounds: Normal heart sounds.  Pulmonary:     Effort: Pulmonary effort is normal. No tachypnea or respiratory distress.     Breath sounds: No stridor or decreased air movement. Examination of the right-middle field reveals rhonchi.  Examination of the left-middle field reveals rhonchi. Examination of the right-lower field reveals rhonchi and rales. Examination of the left-lower field reveals rhonchi and rales. Rhonchi and rales present. No wheezing.  Musculoskeletal:     Right lower leg: Edema present.     Left lower leg: Edema present.           Assessment & Plan:  Prostate cancer screening - Plan: PSA  Shortness of breath - Plan: CBC with Differential/Platelet, Comprehensive metabolic panel with GFR, Brain natriuretic peptide  Elevated IgE level  Asthma, moderate persistent, poorly-controlled Due to his significantly elevated IgE level, the patient has been referred to an allergist.  I am hopeful that they can help with regards to possibly getting the patient started on biologic therapy or even an inhaled phosphodiesterase inhibitor.  Patient has recurrent exacerbations requiring steroids frequently as soon as he stops the steroids.  I believe that biologic therapy may be our best course of action to help control underlie COPD/asthma.  He is already on Trelegy.   I do worry the patient may be developing a community-acquired pneumonia given his abnormal breath sounds today and his purulent sputum.  Therefore I will switch the patient to Augmentin  along with a Z-Pak in case there is resistance to Levaquin .  I will also check a BNP level.  I believe the bibasilar Rales and rhonchi are related to mucous plugging and infection.  The BNP is significantly elevated, we may consider increasing his diuretic temporarily until his breathing improves.  Patient also request that I check a PSA while checking lab work as he is concerned about prostate cancer given the recent diagnosis of former president.  We discussed this at length.  I am happy to check the PSA.  However given his medical comorbidities, I would not pursue further treatment or testing unless his PSA is extremely high

## 2024-05-07 NOTE — Telephone Encounter (Signed)
 Seen in clinic 05/07/24. Due to persistent symptomatic atrial fibrillation, he was set up for cardioversion 05/14/24. Discussed need to completed 4 weeks of anticoagulation without interruption post cardioversion. He requested to delay vitrectomy.   Will require uninterrupted anticoagulation 05/07/24 - 06/12/24. Additionally, has cardiac PET scheduled for 06/11/24 which will help to stratify pre-procedure risk.   Will readdress clearance after cardiac PET 06/11/24.   Routed to requesting provider so they are aware.  Josalyn Dettmann S Sigmond Patalano, NP

## 2024-05-08 ENCOUNTER — Ambulatory Visit (INDEPENDENT_AMBULATORY_CARE_PROVIDER_SITE_OTHER)

## 2024-05-08 VITALS — Ht 69.0 in | Wt 208.0 lb

## 2024-05-08 DIAGNOSIS — Z0000 Encounter for general adult medical examination without abnormal findings: Secondary | ICD-10-CM | POA: Diagnosis not present

## 2024-05-08 LAB — COMPREHENSIVE METABOLIC PANEL WITH GFR
AG Ratio: 1.7 (calc) (ref 1.0–2.5)
ALT: 57 U/L — ABNORMAL HIGH (ref 9–46)
AST: 37 U/L — ABNORMAL HIGH (ref 10–35)
Albumin: 3.8 g/dL (ref 3.6–5.1)
Alkaline phosphatase (APISO): 42 U/L (ref 35–144)
BUN/Creatinine Ratio: 37 (calc) — ABNORMAL HIGH (ref 6–22)
BUN: 43 mg/dL — ABNORMAL HIGH (ref 7–25)
CO2: 29 mmol/L (ref 20–32)
Calcium: 9.3 mg/dL (ref 8.6–10.3)
Chloride: 104 mmol/L (ref 98–110)
Creat: 1.16 mg/dL (ref 0.70–1.28)
Globulin: 2.3 g/dL (ref 1.9–3.7)
Glucose, Bld: 106 mg/dL — ABNORMAL HIGH (ref 65–99)
Potassium: 4.9 mmol/L (ref 3.5–5.3)
Sodium: 140 mmol/L (ref 135–146)
Total Bilirubin: 0.6 mg/dL (ref 0.2–1.2)
Total Protein: 6.1 g/dL (ref 6.1–8.1)
eGFR: 64 mL/min/{1.73_m2} (ref >=60)

## 2024-05-08 LAB — CBC WITH DIFFERENTIAL/PLATELET
Absolute Lymphocytes: 853 {cells}/uL (ref 850–3900)
Absolute Monocytes: 588 {cells}/uL (ref 200–950)
Basophils Absolute: 59 {cells}/uL (ref 0–200)
Basophils Relative: 0.4 %
Eosinophils Absolute: 0 {cells}/uL — ABNORMAL LOW (ref 15–500)
Eosinophils Relative: 0 %
HCT: 43 % (ref 38.5–50.0)
Hemoglobin: 13.8 g/dL (ref 13.2–17.1)
MCH: 28.6 pg (ref 27.0–33.0)
MCHC: 32.1 g/dL (ref 32.0–36.0)
MCV: 89 fL (ref 80.0–100.0)
MPV: 9.5 fL (ref 7.5–12.5)
Monocytes Relative: 4 %
Neutro Abs: 13201 {cells}/uL — ABNORMAL HIGH (ref 1500–7800)
Neutrophils Relative %: 89.8 %
Platelets: 275 10*3/uL (ref 140–400)
RBC: 4.83 10*6/uL (ref 4.20–5.80)
RDW: 16.4 % — ABNORMAL HIGH (ref 11.0–15.0)
Total Lymphocyte: 5.8 %
WBC: 14.7 10*3/uL — ABNORMAL HIGH (ref 3.8–10.8)

## 2024-05-08 LAB — PSA: PSA: 0.19 ng/mL (ref <=4.00)

## 2024-05-08 LAB — BRAIN NATRIURETIC PEPTIDE: Brain Natriuretic Peptide: 132 pg/mL — ABNORMAL HIGH (ref <100)

## 2024-05-08 LAB — SPECIMEN COMPROMISED

## 2024-05-08 NOTE — Progress Notes (Signed)
 Subjective:   Mario [name] is a 78 y.o. who presents for a Medicare Wellness preventive visit.  As a reminder, Annual Wellness Visits don't include a physical exam, and some assessments may be limited, especially if this visit is performed virtually. We may recommend an in-person follow-up visit with your provider if needed.  Visit Complete: Virtual I connected with  Mario [name] on 05/08/24 by a audio enabled telemedicine application and verified that I am speaking with the correct person using two identifiers.  Patient Location: Home  Provider Location: Home Office  I discussed the limitations of evaluation and management by telemedicine. The patient expressed understanding and agreed to proceed.  Vital Signs: Because this visit was a virtual/telehealth visit, some criteria may be missing or patient reported. Any vitals not documented were not able to be obtained and vitals that have been documented are patient reported.  VideoDeclined- This patient declined Librarian, academic. Therefore the visit was completed with audio only.  Persons Participating in Visit: Patient.  AWV Questionnaire: No: Patient Medicare AWV questionnaire was not completed prior to this visit.  Cardiac Risk Factors include: advanced age (>61men, >67 women);dyslipidemia;hypertension;male gender     Objective:     Today's Vitals   05/08/24 1351  Weight: 208 lb (94.3 kg)  Height: 5\' 9"  (1.753 m)   Body mass index is 30.72 kg/m.     05/08/2024    1:54 PM 04/15/2024    6:45 PM 03/10/2024    6:44 AM 02/15/2024   10:25 PM 02/15/2024   10:48 AM 11/23/2022    3:24 PM 05/14/2021    2:51 PM  Advanced Directives  Does Patient Have a Medical Advance Directive? No No Yes Yes Yes Yes Yes  Type of Surveyor, minerals;Living will Healthcare Power of [place];Living will Healthcare Power of [place];Living will Healthcare Power of [place];Living will  Healthcare Power of [place];Living will  Does patient want to make changes to medical advance directive?   No - Patient declined No - Patient declined   No - Patient declined  Copy of Healthcare Power of Attorney in Chart?   No - copy requested No - copy requested  No - copy requested No - copy requested  Would patient like information on creating a medical advance directive? Yes (MAU/Ambulatory/Procedural Areas - Information given) No - Patient declined         Current Medications (verified) Outpatient Encounter Medications as of 05/08/2024  Medication Sig   acetaminophen  (TYLENOL ) 500 MG tablet Take 1,000 mg by mouth every 6 (six) hours as needed for mild pain (pain score 1-3).   amoxicillin -clavulanate (AUGMENTIN ) 875-125 MG tablet Take 1 tablet by mouth 2 (two) times daily.   apixaban  (ELIQUIS ) 5 MG TABS tablet TAKE 1 TABLET BY MOUTH TWICE A DAY   Ascorbic Acid (VITAMIN C) 1000 MG tablet Take 1,000 mg by mouth 2 (two) times daily.   azithromycin  (ZITHROMAX ) 250 MG tablet 2 tabs poqday1, 1 tab poqday 2-5   benzonatate  (TESSALON ) 200 MG capsule Take 1 capsule (200 mg total) by mouth 3 (three) times daily as needed for cough.   budesonide -formoterol  (SYMBICORT ) 160-4.5 MCG/ACT inhaler Inhale 2 puffs into the lungs in the morning and at bedtime. Generic medication   Cholecalciferol (VITAMIN D3) 125 MCG (5000 UT) TABS Take 1 tablet by mouth 2 (two) times daily.   Coenzyme Q10 (CO Q-10) 100 MG CAPS Take 1 capsule by mouth daily.   diltiazem  (DILACOR XR )  240 MG 24 hr capsule Take 1 capsule (240 mg total) by mouth daily.   FIBER COMPLETE PO Take 1 tablet by mouth daily.   furosemide  (LASIX ) 40 MG tablet Take 1 tablet (40 mg total) by mouth daily. Take additional 20 mg if your weight is 3 lbs in 1 day of 5 lbs in 1 week   guaiFENesin  (MUCINEX ) 600 MG 12 hr tablet Take 1 tablet (600 mg total) by mouth 2 (two) times daily.   levalbuterol  (XOPENEX  HFA) 45 MCG/ACT inhaler Inhale 2 puffs into the lungs  every 6 (six) hours as needed for wheezing.   levalbuterol  (XOPENEX ) 0.63 MG/3ML nebulizer solution Take 3 mLs (0.63 mg total) by nebulization 4 (four) times daily for 7 days, THEN 3 mLs (0.63 mg total) every 6 (six) hours as needed for wheezing or shortness of breath.   metoprolol  succinate (TOPROL -XL) 100 MG 24 hr tablet Take 1 tablet (100 mg total) by mouth daily. Take with or immediately following a meal.   montelukast  (SINGULAIR ) 10 MG tablet Take 1 tablet (10 mg total) by mouth daily.   pantoprazole  (PROTONIX ) 40 MG tablet Take 1 tablet (40 mg total) by mouth 2 (two) times daily.   predniSONE  (DELTASONE ) 10 MG tablet Take 40mg  daily for 3days,Take 30mg  daily for 3days,Take 20mg  daily for 3days,Take 10mg  daily for 3days, then stop   Respiratory Therapy Supplies (FLUTTER) DEVI Use as directed   rosuvastatin  (CRESTOR ) 20 MG tablet TAKE 1 TABLET BY MOUTH EVERY DAY **D/C LIPITOR** (Patient taking differently: Take 20 mg by mouth every evening.)   No facility-administered encounter medications on file as of 05/08/2024.    Allergies (verified) Dust mite extract   History: Past Medical History:  Diagnosis Date   Arthritis    Asthma    Atrial fibrillation (HCC)    Biliary colic 2014   Bronchiectasis (HCC)    2021   Cataract    COPD (chronic obstructive pulmonary disease) with chronic bronchitis (HCC)    Coronary artery calcification seen on CAT scan    Cyst of pancreas    1 cm (7/18), repeat ct in 12 months.     Hyperlipidemia    Hypertension    Obstructive sleep apnea    Renal disorder    Rhinitis    Sleep apnea    pt denies sleep apnea-lost weight   Past Surgical History:  Procedure Laterality Date   CHOLECYSTECTOMY     COLONOSCOPY  09/29/2014   INCISION AND DRAINAGE OF WOUND Left 03/13/2024   Procedure: IRRIGATION AND DEBRIDEMENT WOUND;  Surgeon: Arvil Birks, MD;  Location: [facility] OR;  Service: Orthopedics;  Laterality: Left;  IRRIGATION AND DEBRIDEMENT FIFTH FINGER LEFT    LAPAROSCOPIC CHOLECYSTECTOMY SINGLE SITE WITH INTRAOPERATIVE CHOLANGIOGRAM N/A 09/15/2019   Procedure: LAPAROSCOPIC CHOLECYSTECTOMY SINGLE SITE;  Surgeon: Candyce Champagne, MD;  Location: [facility];  Service: General;  Laterality: N/A;   LITHOTRIPSY  2000   for kidney stone   Family History  Problem Relation Age of Onset   Rheumatic fever Mother    Stroke Father    Atrial fibrillation Father    Colon polyps Father    Arthritis Sister    Colon cancer Neg Hx    Esophageal cancer Neg Hx    Rectal cancer Neg Hx    Stomach cancer Neg Hx    Social History   Socioeconomic History   Marital status: Married    Spouse name: Not on file   Number of children: Not on file  Years of education: Not on file   Highest education level: Bachelor's degree (e.g., BA, AB, BS)  Occupational History   Not on file  Tobacco Use   Smoking status: Former    Current packs/day: 0.00    Average packs/day: 2.0 packs/day for 11.0 years (22.0 ttl pk-yrs)    Types: Cigarettes    Start date: 12/20/1963    Quit date: 12/19/1974    Years since quitting: 49.4   Smokeless tobacco: Never  Vaping Use   Vaping status: Never Used  Substance and Sexual Activity   Alcohol use: Not Currently    Alcohol/week: 1.0 standard drink of alcohol   Drug use: No   Sexual activity: Not on file  Other Topics Concern   Not on file  Social History Narrative   Not on file   Social Drivers of Health   Financial Resource Strain: Low Risk  (05/08/2024)   Overall Financial Resource Strain (CARDIA)    Difficulty of Paying Living Expenses: Not very hard  Food Insecurity: No Food Insecurity (05/08/2024)   Hunger Vital Sign    Worried About Running Out of Food in the Last Year: Never true    Ran Out of Food in the Last Year: Never true  Transportation Needs: No Transportation Needs (05/08/2024)   PRAPARE - Administrator, Civil Service (Medical): No    Lack of Transportation (Non-Medical): No  Physical Activity: Inactive  (05/08/2024)   Exercise Vital Sign    Days of Exercise per Week: 0 days    Minutes of Exercise per Session: 0 min  Stress: No Stress Concern Present (05/08/2024)   [employer] of Occupational Health - Occupational Stress Questionnaire    Feeling of Stress : Not at all  Social Connections: Socially Integrated (05/08/2024)   Social Connection and Isolation Panel [NHANES]    Frequency of Communication with Friends and Family: More than three times a week    Frequency of Social Gatherings with Friends and Family: Three times a week    Attends Religious Services: More than 4 times per year    Active Member of Clubs or Organizations: Yes    Attends Banker Meetings: 1 to 4 times per year    Marital Status: Married    Tobacco Counseling Counseling given: Not Answered    Clinical Intake:  Pre-visit preparation completed: Yes  Pain : No/denies pain     Diabetes: No  Lab Results  Component Value Date   HGBA1C 5.6 07/28/2022   HGBA1C 5.3 10/05/2018     How often do you need to have someone help you when you read instructions, pamphlets, or other written materials from your doctor or pharmacy?: 1 - Never  Interpreter Needed?: No  Information entered by :: Seabron Cypress LPN   Activities of Daily Living     05/08/2024    1:51 PM 04/16/2024    1:47 PM  In your present state of health, do you have any difficulty performing the following activities:  Hearing? 0 0  Vision? 0 0  Difficulty concentrating or making decisions? 0 0  Walking or climbing stairs? 0   Dressing or bathing? 0   Doing errands, shopping? 0 0  Preparing Food and eating ? N   Using the Toilet? N   In the past six months, have you accidently leaked urine? N   Do you have problems with loss of bowel control? N   Managing your Medications? N   Managing your Finances? N  Housekeeping or managing your Housekeeping? N     Patient Care Team: Austine Lefort, MD as PCP - General (Family  Medicine) Avanell Leigh, MD as PCP - Cardiology (Cardiology) Diamond Formica, MD as Consulting Physician (Pulmonary Disease) Avanell Leigh, MD as Consulting Physician (Cardiology) Janel Medford, MD (Inactive) as Consulting Physician (Gastroenterology)  Indicate any recent Medical Services you may have received from other than [facility] providers in the past year (date may be approximate).     Assessment:    This is a routine wellness examination for Alon.  Hearing/Vision screen Hearing Screening - Comments:: Denies hearing difficulties   Vision Screening - Comments::  up to date with routine eye exams    Goals Addressed   None    Depression Screen     05/08/2024    1:53 PM 11/24/2023    4:05 PM 11/23/2022    3:21 PM 05/14/2021    2:53 PM 03/26/2021    8:37 AM 07/17/2020    2:35 PM 10/19/2017    2:05 PM  PHQ 2/9 Scores  PHQ - 2 Score 0 0 0 0 0 0 0    Fall Risk     05/08/2024    1:55 PM 11/24/2023    4:05 PM 11/23/2022    3:24 PM 11/22/2022   11:50 AM 05/14/2021    2:52 PM  Fall Risk   Falls in the past year? 0 1 0 0 0  Number falls in past yr: 0 0 0  0  Injury with Fall? 0 1 0 0 0  Risk for fall due to : Impaired mobility  No Fall Risks  No Fall Risks  Follow up Falls prevention discussed;Education provided;Falls evaluation completed  Falls prevention discussed  Falls evaluation completed;Falls prevention discussed    MEDICARE RISK AT HOME:  Medicare Risk at Home Any stairs in or around the home?: No If so, are there any without handrails?: No Home free of loose throw rugs in walkways, pet beds, electrical cords, etc?: Yes Adequate lighting in your home to reduce risk of falls?: Yes Life alert?: No Use of a cane, walker or w/c?: No Grab bars in the bathroom?: Yes Shower chair or bench in shower?: No Elevated toilet seat or a handicapped toilet?: Yes  TIMED UP AND GO:  Was the test performed?  No  Cognitive Function: 6CIT completed        05/08/2024     1:55 PM 11/23/2022    3:26 PM  6CIT Screen  What Year? 0 points 0 points  What month? 0 points 0 points  What time? 0 points 0 points  Count back from 20 0 points 0 points  Months in reverse 0 points 0 points  Repeat phrase 0 points 0 points  Total Score 0 points 0 points    Immunizations Immunization History  Administered Date(s) Administered   Fluad Quad(high Dose 65+) 09/09/2019   Influenza Split 09/09/2013   Influenza, High Dose Seasonal PF 09/22/2015, 09/27/2016, 09/27/2016, 09/11/2017   Influenza,inj,Quad PF,6+ Mos 09/03/2018   Influenza-Unspecified 09/18/2014   PFIZER(Purple Top)SARS-COV-2 Vaccination 02/05/2020, 02/26/2020   Pneumococcal Conjugate-13 02/17/2014   Pneumococcal Polysaccharide-23 10/13/2009, 04/16/2015   Tdap 03/13/2007, 03/10/2024   Zoster, Live 05/29/2015    Screening Tests Health Maintenance  Topic Date Due   Hepatitis C Screening  Never done   Zoster Vaccines- Shingrix (1 of 2) 03[birth year]06/1965   COVID-19 Vaccine (3 - Pfizer risk series) 03/25/2020   INFLUENZA VACCINE  07/19/2024  Medicare Annual Wellness (AWV)  05/08/2025   Pneumonia Vaccine 81+ Years old  Completed   HPV VACCINES  Aged Out   Meningococcal B Vaccine  Aged Out   DTaP/Tdap/Td  Discontinued   Colonoscopy  Discontinued    Health Maintenance  Health Maintenance Due  Topic Date Due   Hepatitis C Screening  Never done   Zoster Vaccines- Shingrix (1 of 2) 03/06/1964   COVID-19 Vaccine (3 - Pfizer risk series) 03/25/2020   Health Maintenance Items Addressed: Information provided on recommended vaccines   Additional Screening:  Vision Screening: Recommended annual ophthalmology exams for early detection of glaucoma and other disorders of the eye.  Dental Screening: Recommended annual dental exams for proper oral hygiene  Community Resource Referral / Chronic Care Management: CRR required this visit?  No   CCM required this visit?  No   Plan:    I have personally reviewed  and noted the following in the patient's chart:   Medical and social history Use of alcohol, tobacco or illicit drugs  Current medications and supplements including opioid prescriptions. Patient is not currently taking opioid prescriptions. Functional ability and status Nutritional status Physical activity Advanced directives List of other physicians Hospitalizations, surgeries, and ER visits in previous 12 months Vitals Screenings to include cognitive, depression, and falls Referrals and appointments  In addition, I have reviewed and discussed with patient certain preventive protocols, quality metrics, and best practice recommendations. A written personalized care plan for preventive services as well as general preventive health recommendations were provided to patient.   Seabron Cypress [place], [place]   1/61/0960   After Visit Summary: (MyChart) Due to this being a telephonic visit, the after visit summary with patients personalized plan was offered to patient via MyChart   Notes: Nothing significant to report at this time.

## 2024-05-08 NOTE — Patient Instructions (Signed)
 Mario [name] , Thank you for taking time out of your busy schedule to complete your Annual Wellness Visit with me. I enjoyed our conversation and look forward to speaking with you again next year. I, as well as your care team,  appreciate your ongoing commitment to your health goals. Please review the following plan we discussed and let me know if I can assist you in the future. Your Game plan/ To Do List   Follow up Visits: Next Medicare AWV with our clinical staff: In 1 year    Have you seen your provider in the last 6 months (3 months if uncontrolled diabetes)? Yes Next Office Visit with your provider: To be scheduled   Clinician Recommendations:  Aim for 30 minutes of exercise or brisk walking, 6-8 glasses of water , and 5 servings of fruits and vegetables each day.       This is a list of the screening recommended for you and due dates:  Health Maintenance  Topic Date Due   Hepatitis C Screening  Never done   Zoster (Shingles) Vaccine (1 of 2) 03/06/1964   COVID-19 Vaccine (3 - Pfizer risk series) 03/25/2020   Flu Shot  07/19/2024   Medicare Annual Wellness Visit  05/08/2025   Pneumonia Vaccine  Completed   HPV Vaccine  Aged Out   Meningitis B Vaccine  Aged Out   DTaP/Tdap/Td vaccine  Discontinued   Colon Cancer Screening  Discontinued    Advanced directives: (ACP Link)Information on Advanced Care Planning can be found at [place]  Secretary of [facility] Advance Health Care Directives Advance Health Care Directives. [email or web address]   Advance Care Planning is important because it:  [x]  Makes sure you receive the medical care that is consistent with your values, goals, and preferences  [x]  It provides guidance to your family and loved ones and reduces their decisional burden about whether or not they are making the right decisions based on your wishes.  Follow the link provided in your after visit summary or read over the paperwork we have mailed to you to help you started getting your  Advance Directives in place. If you need assistance in completing these, please reach out to us  so that we can help you!  See attachments for Preventive Care and Fall Prevention Tips.

## 2024-05-09 ENCOUNTER — Ambulatory Visit: Payer: Self-pay | Admitting: Family Medicine

## 2024-05-10 ENCOUNTER — Encounter: Payer: Self-pay | Admitting: Nurse Practitioner

## 2024-05-10 ENCOUNTER — Telehealth: Payer: Self-pay

## 2024-05-10 ENCOUNTER — Ambulatory Visit: Admitting: Nurse Practitioner

## 2024-05-10 VITALS — BP 110/78 | HR 98 | Temp 96.9°F | Ht 69.0 in | Wt 211.0 lb

## 2024-05-10 DIAGNOSIS — J4550 Severe persistent asthma, uncomplicated: Secondary | ICD-10-CM

## 2024-05-10 DIAGNOSIS — J4540 Moderate persistent asthma, uncomplicated: Secondary | ICD-10-CM

## 2024-05-10 DIAGNOSIS — J479 Bronchiectasis, uncomplicated: Secondary | ICD-10-CM | POA: Diagnosis not present

## 2024-05-10 DIAGNOSIS — J320 Chronic maxillary sinusitis: Secondary | ICD-10-CM

## 2024-05-10 DIAGNOSIS — I480 Paroxysmal atrial fibrillation: Secondary | ICD-10-CM | POA: Diagnosis not present

## 2024-05-10 DIAGNOSIS — R058 Other specified cough: Secondary | ICD-10-CM

## 2024-05-10 LAB — NITRIC OXIDE: Nitric Oxide: 26

## 2024-05-10 MED ORDER — PREDNISONE 10 MG PO TABS: 10.0000 mg | ORAL_TABLET | Freq: Every day | ORAL | 3 refills | Status: DC

## 2024-05-10 NOTE — Telephone Encounter (Signed)
 Patient was seen in the office today. Canary Ceo, NP has ordered Fasenra for the patient.  He has signed the form and it has been faxed to the pharmacy team for completion.

## 2024-05-10 NOTE — Assessment & Plan Note (Addendum)
 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 Treated with prednisone /abx numerous times over the last few months. No added benefit with LAMA therapy. Compliant with high dose Symbicort  and singulair . Given he is prednisone  dependent, will initiate biologic therapy with Fasenra. Safety profile reviewed and enrollment completed. He will remain on low dose daily prednisone  until he has established on therapy and symptoms stabilized. He will follow up with allergy  as well to discuss if additional therapies are warranted. Action plan in place. Continue trigger prevention  He does mucus plugging and bronchiectasis on imaging. Possibility of underlying indolent infection not ruled out. Recommend that if he has recurrent sputum production, sputum culture and AFB need to be obtained. Encouraged mucociliary clearance therapies.   Patient Instructions  Continue Symbicort  2 puffs Twice daily. Brush tongue and rinse mouth afterwards Continue Albuterol  inhaler 2 puffs or levalbuterol  3 mL neb every 6 hours as needed for shortness of breath or wheezing. Notify if symptoms persist despite rescue inhaler/neb use. Use nebs 2-3 times a day then follow with flutter valve 10 times  Continue montelukast  1 tab daily Continue pantoprazole  Twice daily for reflux Continue mucinex  DM Twice daily for cough/congestion Continue daily non drowsy allergy  pill such as claritin, zyrtec or xyzal.    Start Fasenra injections initially every 4 weeks for loading then every 8 weeks   Continue prednisone  10 mg daily until we get you established on the injectable medications. Take in AM with food  Augmentin  as prescribed. Monitor your cough and breathing as you come off of this   Attend appointment with the  allergist as scheduled    Follow up in 4-6 weeks with Katie Aishia Barkey,NP. Schedule appt with Dr. Diania Fortes in 3 months to establish care in [place]. If symptoms do not improve or worsen, please contact office for sooner follow up or seek emergency care.

## 2024-05-10 NOTE — Assessment & Plan Note (Signed)
 Cardioversion scheduled for 5/27. Follow up with cardiology as scheduled

## 2024-05-10 NOTE — Progress Notes (Signed)
 @Patient  ID: Mario [name], male    DOB: [birth year], 78 y.o.   MRN: [id number]  Chief Complaint  Patient presents with   Follow-up    Cough, dry. Wheezing. DOE.  Saw PCP 2 weeks ago and was put on antibiotics.    Referring provider: Austine Lefort, MD  HPI: 78 year old male, former smoker followed for COPD/chronic asthma and bronchiectasis.  He will be a patient of Dr. Reine Caraway and last via virtual visit 04/12/2024 by [facility] NP. Past medical history significant for A-fib RVR on Eliquis , hypertension, CAD, chronic sinusitis, GERD, HLD.  TEST/EVENTS:  08/13/2018 PFT: FVC 73, FEV1 63, ratio 62, TLC 88, DLCO 74.  Moderate obstruction with moderate diffusing defect 08/2020 sputum culture positive for pansensitive pseudomonas  03/24/2023 sputum culture positive for serratia liquefaciens resistant to augmentin  and cefazolin  03/2023 eos 304  08/14/2023 CT sinus: Unchanged moderate mucosal disease in right maxillary sinus.  Interval resolution of left maxillary sinus disease.  Unchanged occlusion of the right ostiomeatal unit by mucosal disease 10/31/2023 echo: EF 50 to 55%.  RV size and function is normal.  Normal PASP.  LA mildly dilated. 12/05/2023 HRCT chest: Atherosclerosis.  Moderate hiatal hernia.  Diffuse bilateral bronchial wall thickening.  Increase in scarring and volume loss of left lung base.  Similar scarring of right lung base.  Bronchial plugging throughout the left lung base as well as small irregular infectious or inflammatory nodules in the left lower lobe.  Small pulmonary nodules, unchanged and definitively benign.  Mild lobular air trapping. 04/15/2024 CXR: mild bibasilar scarring/atelectasis, unchanged.  04/16/2024 echo: EF 50-55%. RV function low normal, size nl. LA moderately dilated. Trivial MR.  04/30/2024 IgE: 1423  02/07/2024: Virtual visit with Dr. Waymond Hailey.  Maintained on Symbicort .  Finishing Cipro  and reports that mucus is much lighter but still with severe cough.  Advised to  reduce Symbicort  to 1 puff twice daily.  Increase nebulizer up to every 4 hours as needed.  Advised to use Mucinex  DM for cough and congestion.  04/12/2024: Margarito Shearing with Rhyse Skowron NP Discussed the use of AI scribe software for clinical note transcription with the patient, who gave verbal consent to proceed. Mario [name] "Athena Bland" is a 78 year old male with bronchiectasis and COPD/chronic asthma who presents for surgical risk assessment prior to retina surgery.  However, he is having acute symptoms with worsening shortness of breath and cough. He has experienced worsening shortness of breath and cough since his finger surgery on March 13, 2024. He reports significant weakness and dyspnea, requiring rest after walking short distances. He uses his rescue inhaler daily and sleeps in a recliner due to wheezing.  The rescue inhaler does seem to help some.  He has a significant cough, which is increased in frequency.  Has some trouble getting phlegm up in the morning and tends to use his rescue inhaler, which does help open him up.  Sputum is clear, which has improved since he completed courses of doxycycline  and Cipro .  He is currently on prednisone  10 mg daily, prescribed by his primary care doctor.  Feels like this is helping slightly.  Has not had a recent chest x-ray. He has a history of bronchiectasis.  He is a former smoker with moderate obstruction on prior PFT.  Does carry diagnosis of asthma as well.  Uses Symbicort  twice daily. He discontinued Spiriva  due to lack of improvement. He takes montelukast  and Mucinex  DM daily. Not currently taking a daily allergy  pill. Does not feel like  his sinuses are troublesome right now. He reports leg swelling managed with Lasix . No recent increase. No palpitations or CP.  No fever, chills, or hemoptysis. Oxygen levels have been stable, and he has not experienced hypoxemia. He has a history of allergy  testing but does not currently see an allergist.  His doctor mentioned a shot to  help with his breathing problems. He is wondering if this would be an option for him. Highest peripheral eosinophil count documented 304 in April 2024.  His upcoming surgery is not under anesthesia. To his knowledge, he is supposed to be awake. It is currently scheduled for 5/1.  FeNO 26 ppb    05/10/2024: Today - follow up Discussed the use of AI scribe software for clinical note transcription with the patient, who gave verbal consent to proceed.  History of Present Illness   Mario [name] "Athena Bland" is a 78 year old male who presents for follow up with his wife.   He has been experiencing significant asthma/COPD flare-ups, which have been difficult to manage and have resulted in frequent use of steroids/abx. His IgE levels were significantly elevated at 1400 upon recent check by his PCP. Eos were 0 but he was on prednisone . He has a history of elevation up to 304 on prior checks.   He's had numerous flares over the past few months with productive cough. He gets short winded and has wheezing. No fevers, chills, hemoptysis.   He is also having issues with a fib and planned for cardioversion next Tuesday. No CP, syncope, dizziness.   After our last visit, he had improved but then started having issues with productive cough with green phlegm again. His PCP started him back on augmentin  and prednisone , which he is preparing to complete. He feels better. Still has some chest tightness and cough, but the cough is dry and he is no longer producing any phlegm. He is currently using Mucinex . Uses Symbicort  Twice daily, montelukast , allergy  pill. On 10 mg prednisone  currently.   He was having some issues with leg swelling, which has improved. Monitors his weight daily to manage fluid retention.  No low oxygen levels despite feeling short-winded.     FeNO 26 ppb   Allergies  Allergen Reactions   Dust Mite Extract Other (See Comments)    Immunization History  Administered Date(s) Administered    Fluad Quad(high Dose 65+) 09/09/2019   Influenza Split 09/09/2013   Influenza, High Dose Seasonal PF 09/22/2015, 09/27/2016, 09/27/2016, 09/11/2017   Influenza,inj,Quad PF,6+ Mos 09/03/2018   Influenza-Unspecified 09/18/2014   PFIZER(Purple Top)SARS-COV-2 Vaccination 02/05/2020, 02/26/2020   Pneumococcal Conjugate-13 02/17/2014   Pneumococcal Polysaccharide-23 10/13/2009, 04/16/2015   Tdap 03/13/2007, 03/10/2024   Zoster, Live 05/29/2015    Past Medical History:  Diagnosis Date   Arthritis    Asthma    Atrial fibrillation (HCC)    Biliary colic 2014   Bronchiectasis (HCC)    2021   Cataract    COPD (chronic obstructive pulmonary disease) with chronic bronchitis (HCC)    Coronary artery calcification seen on CAT scan    Cyst of pancreas    1 cm (7/18), repeat ct in 12 months.     Hyperlipidemia    Hypertension    Obstructive sleep apnea    Renal disorder    Rhinitis    Sleep apnea    pt denies sleep apnea-lost weight    Tobacco History: Social History   Tobacco Use  Smoking Status Former   Current packs/day: 0.00  Average packs/day: 2.0 packs/day for 11.0 years (22.0 ttl pk-yrs)   Types: Cigarettes   Start date: 12/20/1963   Quit date: 12/19/1974   Years since quitting: 49.4  Smokeless Tobacco Never   Counseling given: Not Answered   Outpatient Medications Prior to Visit  Medication Sig Dispense Refill   acetaminophen  (TYLENOL ) 500 MG tablet Take 1,000 mg by mouth every 6 (six) hours as needed for mild pain (pain score 1-3).     amoxicillin -clavulanate (AUGMENTIN ) 875-125 MG tablet Take 1 tablet by mouth 2 (two) times daily. 20 tablet 0   apixaban  (ELIQUIS ) 5 MG TABS tablet TAKE 1 TABLET BY MOUTH TWICE A DAY 60 tablet 5   Ascorbic Acid (VITAMIN C) 1000 MG tablet Take 1,000 mg by mouth 2 (two) times daily.     azithromycin  (ZITHROMAX ) 250 MG tablet 2 tabs poqday1, 1 tab poqday 2-5 6 tablet 0   benzonatate  (TESSALON ) 200 MG capsule Take 1 capsule (200 mg total) by  mouth 3 (three) times daily as needed for cough. 30 capsule 1   budesonide -formoterol  (SYMBICORT ) 160-4.5 MCG/ACT inhaler Inhale 2 puffs into the lungs in the morning and at bedtime. Generic medication 1 each 6   Cholecalciferol (VITAMIN D3) 125 MCG (5000 UT) TABS Take 1 tablet by mouth 2 (two) times daily.     Coenzyme Q10 (CO Q-10) 100 MG CAPS Take 1 capsule by mouth daily.     diltiazem  (DILACOR XR ) 240 MG 24 hr capsule Take 1 capsule (240 mg total) by mouth daily. 90 capsule 1   FIBER COMPLETE PO Take 1 tablet by mouth daily.     furosemide  (LASIX ) 40 MG tablet Take 1 tablet (40 mg total) by mouth daily. Take additional 20 mg if your weight is 3 lbs in 1 day of 5 lbs in 1 week 30 tablet 3   guaiFENesin  (MUCINEX ) 600 MG 12 hr tablet Take 1 tablet (600 mg total) by mouth 2 (two) times daily. 60 tablet 0   levalbuterol  (XOPENEX  HFA) 45 MCG/ACT inhaler Inhale 2 puffs into the lungs every 6 (six) hours as needed for wheezing. 1 each 12   levalbuterol  (XOPENEX ) 0.63 MG/3ML nebulizer solution Take 3 mLs (0.63 mg total) by nebulization 4 (four) times daily for 7 days, THEN 3 mLs (0.63 mg total) every 6 (six) hours as needed for wheezing or shortness of breath.     metoprolol  succinate (TOPROL -XL) 100 MG 24 hr tablet Take 1 tablet (100 mg total) by mouth daily. Take with or immediately following a meal. 90 tablet 3   montelukast  (SINGULAIR ) 10 MG tablet Take 1 tablet (10 mg total) by mouth daily. 90 tablet 2   pantoprazole  (PROTONIX ) 40 MG tablet Take 1 tablet (40 mg total) by mouth 2 (two) times daily. 180 tablet 2   Respiratory Therapy Supplies (FLUTTER) DEVI Use as directed 1 each 0   rosuvastatin  (CRESTOR ) 20 MG tablet TAKE 1 TABLET BY MOUTH EVERY DAY **D/C LIPITOR** (Patient taking differently: Take 20 mg by mouth every evening.) 90 tablet 2   predniSONE  (DELTASONE ) 10 MG tablet Take 40mg  daily for 3days,Take 30mg  daily for 3days,Take 20mg  daily for 3days,Take 10mg  daily for 3days, then stop 30 tablet  0   No facility-administered medications prior to visit.     Review of Systems:   Constitutional: No weight loss or gain, night sweats, fevers, chills, lassitude. +fatigue  HEENT: No headaches, difficulty swallowing, tooth/dental problems, or sore throat. No sneezing, itching, ear ache +chronic nasal congestion CV: +baseline swelling  in lower extremities (improved). No chest pain, orthopnea, PND, anasarca, dizziness, palpitations, syncope Resp: + shortness of breath with exertion; cough non productive; wheezing (improved). No excess mucus or change in color of mucus. No hemoptysis.No chest wall deformity GI:  No heartburn, indigestion, abdominal pain, nausea, vomiting, diarrhea, change in bowel habits, loss of appetite, bloody stools.  GU: No dysuria, change in color of urine, urgency or frequency.  No flank pain, no hematuria  Skin: No rash, lesions, ulcerations MSK:  No joint pain or swelling.   Neuro: No dizziness or lightheadedness.  Psych: No depression or anxiety. Mood stable.     Physical Exam:  BP 110/78 (BP Location: Left Arm, Cuff Size: Large)   Pulse 98   Temp (!) 96.9 F (36.1 C)   Ht 5\' 9"  (1.753 m)   Wt 211 lb (95.7 kg)   SpO2 95%   BMI 31.16 kg/m   GEN: Pleasant, interactive, well-kempt; obese; non-toxic and in no acute distress HEENT:  Normocephalic and atraumatic. PERRLA. Sclera white. Nasal turbinates pale, moist and patent bilaterally. No rhinorrhea present. Oropharynx pink and moist, without exudate or edema. No lesions, ulcerations, or postnasal drip.  NECK:  Supple w/ fair ROM. No JVD present. Normal carotid impulses w/o bruits. Thyroid  symmetrical with no goiter or nodules palpated. No lymphadenopathy.   CV: Irregular rhythm, rate controlled, no m/r/g, no peripheral edema. Pulses intact, +2 bilaterally. No cyanosis, pallor or clubbing. PULMONARY:  Unlabored, regular breathing. Minimal scattered rhonchi bilaterally bases otherwise clear A&P. No accessory  muscle use.  GI: BS present and normoactive. Soft, non-tender to palpation. No organomegaly or masses detected.  MSK: No erythema, warmth or tenderness. Cap refil <2 sec all extrem. No deformities or joint swelling noted.  Neuro: A/Ox3. No focal deficits noted.   Skin: Warm, no lesions or rashe Psych: Normal affect and behavior. Judgement and thought content appropriate.     Lab Results:  CBC    Component Value Date/Time   WBC 14.7 (H) 05/07/2024 1652   RBC 4.83 05/07/2024 1652   HGB 13.8 05/07/2024 1652   HCT 43.0 05/07/2024 1652   PLT 275 05/07/2024 1652   MCV 89.0 05/07/2024 1652   MCH 28.6 05/07/2024 1652   MCHC 32.1 05/07/2024 1652   RDW 16.4 (H) 05/07/2024 1652   LYMPHSABS 2,130 09/11/2023 1622   MONOABS 1.3 (H) 09/16/2019 0341   EOSABS 0 (L) 05/07/2024 1652   BASOSABS 59 05/07/2024 1652    BMET    Component Value Date/Time   NA 140 05/07/2024 1652   NA 141 03/12/2024 1239   K 4.9 05/07/2024 1652   CL 104 05/07/2024 1652   CO2 29 05/07/2024 1652   GLUCOSE 106 (H) 05/07/2024 1652   BUN 43 (H) 05/07/2024 1652   BUN 29 (H) 03/12/2024 1239   CREATININE 1.16 05/07/2024 1652   CALCIUM  9.3 05/07/2024 1652   GFRNONAA >60 04/18/2024 0438   GFRNONAA 72 03/26/2021 0855   GFRAA 83 03/26/2021 0855    BNP    Component Value Date/Time   BNP 132 (H) 05/07/2024 1652     Imaging:  ECHOCARDIOGRAM COMPLETE Result Date: 04/16/2024    ECHOCARDIOGRAM REPORT   Patient Name:   Mario [name] Date of Exam: 04/16/2024 Medical Rec #:  [id number]       Height:       69.0 in Accession #:    [id number]      Weight:       216.0 lb Date of Birth:  [birth year]       BSA:          2.135 m Patient Age:    78 years        BP:           123/87 mmHg Patient Gender: M               HR:           97 bpm. Exam Location:  Inpatient Procedure: 2D Echo, Cardiac Doppler and Color Doppler (Both Spectral and Color            Flow Doppler were utilized during procedure). Indications:    A Fib  History:         Patient has prior history of Echocardiogram examinations, most                 recent 10/31/2023. Arrythmias:Atrial Fibrillation; Risk                 Factors:Hypertension.  Sonographer:    Janette Medley Referring Phys: [id number] ANASTASSIA DOUTOVA IMPRESSIONS  1. Left ventricular ejection fraction, by estimation, is 50 to 55%. The left ventricle has low normal function. Left ventricular endocardial border not optimally defined to evaluate regional wall motion. There is mild concentric left ventricular hypertrophy. Left ventricular diastolic function could not be evaluated.  2. Right ventricular systolic function is low normal. The right ventricular size is normal.  3. Left atrial size was moderately dilated.  4. The mitral valve is normal in structure. Trivial mitral valve regurgitation. No evidence of mitral stenosis.  5. The aortic valve is grossly normal. There is mild calcification of the aortic valve. There is mild thickening of the aortic valve. Aortic valve regurgitation is not visualized. Aortic valve sclerosis/calcification is present, without any evidence of aortic stenosis. Comparison(s): No significant change from prior study. Prior images reviewed side by side. FINDINGS  Left Ventricle: Left ventricular ejection fraction, by estimation, is 50 to 55%. The left ventricle has low normal function. Left ventricular endocardial border not optimally defined to evaluate regional wall motion. The left ventricular internal cavity  size was normal in size. There is mild concentric left ventricular hypertrophy. Left ventricular diastolic function could not be evaluated due to atrial fibrillation. Left ventricular diastolic function could not be evaluated. Right Ventricle: The right ventricular size is normal. No increase in right ventricular wall thickness. Right ventricular systolic function is low normal. Left Atrium: Left atrial size was moderately dilated. Right Atrium: Right atrial size was normal in size.  Prominent Eustachian valve. Pericardium: There is no evidence of pericardial effusion. Mitral Valve: The mitral valve is normal in structure. Trivial mitral valve regurgitation. No evidence of mitral valve stenosis. Tricuspid Valve: The tricuspid valve is normal in structure. Tricuspid valve regurgitation is mild. Aortic Valve: The aortic valve is grossly normal. There is mild calcification of the aortic valve. There is mild thickening of the aortic valve. Aortic valve regurgitation is not visualized. Aortic valve sclerosis/calcification is present, without any evidence of aortic stenosis. Pulmonic Valve: The pulmonic valve was not well visualized. Pulmonic valve regurgitation is not visualized. Aorta: The aortic root and ascending aorta are structurally normal, with no evidence of dilitation. IAS/Shunts: No atrial level shunt detected by color flow Doppler.  LEFT VENTRICLE PLAX 2D LVIDd:         4.10 cm   Diastology LVIDs:         2.90 cm   LV e' medial:    8.81  cm/s LV PW:         1.40 cm   LV E/e' medial:  7.2 LV IVS:        1.20 cm   LV e' lateral:   8.16 cm/s LVOT diam:     2.10 cm   LV E/e' lateral: 7.8 LV SV:         43 LV SV Index:   20 LVOT Area:     3.46 cm  RIGHT VENTRICLE             IVC RV S prime:     10.00 cm/s  IVC diam: 1.50 cm TAPSE (M-mode): 2.1 cm LEFT ATRIUM             Index        RIGHT ATRIUM           Index LA Vol (A2C):   88.2 ml 41.32 ml/m  RA Area:     13.10 cm LA Vol (A4C):   70.3 ml 32.93 ml/m  RA Volume:   27.20 ml  12.74 ml/m LA Biplane Vol: 80.8 ml 37.85 ml/m  AORTIC VALVE LVOT Vmax:   63.60 cm/s LVOT Vmean:  44.700 cm/s LVOT VTI:    0.124 m  AORTA Ao Root diam: 3.30 cm Ao Asc diam:  3.50 cm MV E velocity: 63.82 cm/s  TRICUSPID VALVE                            TR Peak grad:   28.5 mmHg                            TR Vmax:        267.00 cm/s                             SHUNTS                            Systemic VTI:  0.12 m                            Systemic Diam: 2.10 cm Karyl Paget  Croitoru MD Electronically signed by Luana Rumple MD Signature Date/Time: 04/16/2024/1:42:20 PM    Final    DG Chest Port 1 View Result Date: 04/15/2024 EXAM: 1 VIEW(S) XRAY OF THE CHEST 04/15/2024 07:39:00 PM COMPARISON: 04/12/2024 CLINICAL HISTORY: 5107 Atrial fibrillation (HCC) 5107. Reports increased SOB and leg swelling. Saw pulmonology today and cardiology. Patient hx of Afib and was here approx 2 months ago for same. Patient sent by cardiology for Afib RVR 120-150s. FINDINGS: LUNGS AND PLEURA: Mild bibasilar scarring/atelectasis, unchanged. No consolidation. No pulmonary edema. No pleural effusion. No pneumothorax. HEART AND MEDIASTINUM: No acute abnormality of the cardiac and mediastinal silhouettes. BONES AND SOFT TISSUES: No acute osseous abnormality. IMPRESSION: 1. No acute cardiopulmonary pathology. Electronically signed by: Zadie Herter MD 04/15/2024 08:04 PM EDT RP Workstation: [id number]   DG Chest 2 View Result Date: 04/12/2024 CLINICAL DATA:  Cough for several months. EXAM: CHEST - 2 VIEW COMPARISON:  February 15, 2024. FINDINGS: Stable cardiomediastinal silhouette. Minimal bibasilar subsegmental atelectasis or scarring is noted. Minimal left pleural effusion may be present. Severe compression deformity of lower thoracic vertebral body is noted concerning for fracture of indeterminate age. IMPRESSION: Minimal bibasilar subsegmental  atelectasis or scarring, with minimal left pleural effusion. Severe compression deformity of lower thoracic vertebral body concerning for fracture of indeterminate age. Electronically Signed   By: Rosalene Colon M.D.   On: 04/12/2024 11:42    methylPREDNISolone  acetate (DEPO-MEDROL ) injection 80 mg     Date Action Dose Route User   Discharged on 04/18/2024   Admitted on 04/15/2024   04/12/2024 1048 Given 80 mg Intramuscular (Left Deltoid) Shana Daring, CMA      methylPREDNISolone  acetate (DEPO-MEDROL ) injection 60 mg     Date Action Dose Route  User   04/30/2024 1638 Given 60 mg Intramuscular (Left Ventrogluteal) Verneda Golder, LPN          Latest Ref Rng & Units 08/13/2018   10:52 AM  PFT Results  FVC-Pre L 2.99   FVC-Predicted Pre % 73   Pre FEV1/FVC % % 62   FEV1-Pre L 1.85   FEV1-Predicted Pre % 63   DLCO uncorrected ml/min/mmHg 23.00   DLCO UNC% % 75   DLCO corrected ml/min/mmHg 22.69   DLCO COR %Predicted % 74   DLVA Predicted % 108   TLC L 5.95   TLC % Predicted % 88   RV % Predicted % 120     Lab Results  Component Value Date   NITRICOXIDE 26 05/10/2024        Assessment & Plan:   Very poorly controlled severe persistent asthma Poorly controlled asthma/COPD overlap with recurrent exacerbations and elevated exhaled nitric oxide  testing despite steroid use. 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 Treated with prednisone /abx numerous times over the last few months. No added benefit with LAMA therapy. Compliant with high dose Symbicort  and singulair . Given he is prednisone  dependent, will initiate biologic therapy with Fasenra. Safety profile reviewed and enrollment completed. He will remain on low dose daily prednisone  until he has established on therapy and symptoms stabilized. He will follow up with allergy  as well to discuss if additional therapies are warranted. Action plan in place. Continue trigger prevention  He does mucus plugging and bronchiectasis on imaging. Possibility of underlying indolent infection not ruled out. Recommend that if he has recurrent sputum production, sputum culture and AFB need to be obtained. Encouraged mucociliary clearance therapies.   Patient Instructions  Continue Symbicort  2 puffs Twice daily. Brush tongue and rinse mouth afterwards Continue Albuterol  inhaler 2 puffs or levalbuterol  3 mL neb every 6 hours as needed for shortness of breath or wheezing. Notify if  symptoms persist despite rescue inhaler/neb use. Use nebs 2-3 times a day then follow with flutter valve 10 times  Continue montelukast  1 tab daily Continue pantoprazole  Twice daily for reflux Continue mucinex  DM Twice daily for cough/congestion Continue daily non drowsy allergy  pill such as claritin, zyrtec or xyzal.    Start Fasenra injections initially every 4 weeks for loading then every 8 weeks   Continue prednisone  10 mg daily until we get you established on the injectable medications. Take in AM with food  Augmentin  as prescribed. Monitor your cough and breathing as you come off of this   Attend appointment with the allergist as scheduled    Follow up in 4-6 weeks with Katie Ciarrah Rae,NP. Schedule appt with Dr. Diania Fortes in 3 months to establish care in [place]. If symptoms do not improve or worsen, please contact office for sooner follow up or seek emergency care.    Chronic maxillary sinusitis Likely  contributes to dyspnea and cough. Follows with ENT. Advised to keep appointment with allergy  and continue current allergy  regimen  Bronchiectasis without complication (HCC) Clinically improving. See above plan  Paroxysmal atrial fibrillation [facility]) Cardioversion scheduled for 5/27. Follow up with cardiology as scheduled    Advised if symptoms do not improve or worsen, to please contact office for sooner follow up or seek emergency care.   I spent 45 minutes of dedicated to the care of this patient on the date of this encounter to include pre-visit review of records, face-to-face time with the patient discussing conditions above, post visit ordering of testing, clinical documentation with the electronic health record, making appropriate referrals as documented, and communicating necessary findings to members of the patients care team.  Roetta Clarke, NP 05/10/2024  Pt aware and understands NP's role.

## 2024-05-10 NOTE — Patient Instructions (Addendum)
 Continue Symbicort  2 puffs Twice daily. Brush tongue and rinse mouth afterwards Continue Albuterol  inhaler 2 puffs or levalbuterol  3 mL neb every 6 hours as needed for shortness of breath or wheezing. Notify if symptoms persist despite rescue inhaler/neb use. Use nebs 2-3 times a day then follow with flutter valve 10 times  Continue montelukast  1 tab daily Continue pantoprazole  Twice daily for reflux Continue mucinex  DM Twice daily for cough/congestion Continue daily non drowsy allergy  pill such as claritin, zyrtec or xyzal.    Start Fasenra injections initially every 4 weeks for loading then every 8 weeks   Continue prednisone  10 mg daily until we get you established on the injectable medications. Take in AM with food  Augmentin  as prescribed. Monitor your cough and breathing as you come off of this   Attend appointment with the allergist as scheduled    Follow up in 4-6 weeks with Mario Monae Topping,NP. Schedule appt with Mario [name] in 3 months to establish care in [place]. If symptoms do not improve or worsen, please contact office for sooner follow up or seek emergency care.

## 2024-05-10 NOTE — Assessment & Plan Note (Signed)
 Clinically improving. See above plan

## 2024-05-10 NOTE — Progress Notes (Signed)
 Called patient with pre-procedure instructions for Tuesday May 14, 2024.   Patient informed of:   Time to arrive for procedure. 0945 Remain NPO past midnight.  Must have a ride home and a responsible adult to remain with them for 24 hours post procedure.  Confirmed blood thinner. Eliquis  Confirmed no breaks in taking blood thinner for 3+ weeks prior to procedure.

## 2024-05-10 NOTE — Assessment & Plan Note (Signed)
 Likely contributes to dyspnea and cough. Follows with ENT. Advised to keep appointment with allergy  and continue current allergy  regimen

## 2024-05-14 ENCOUNTER — Ambulatory Visit (HOSPITAL_COMMUNITY)

## 2024-05-14 ENCOUNTER — Ambulatory Visit (HOSPITAL_COMMUNITY)
Admission: RE | Admit: 2024-05-14 | Discharge: 2024-05-14 | Disposition: A | Discharge status: Home / Self Care | Attending: Cardiology | Admitting: Cardiology

## 2024-05-14 ENCOUNTER — Encounter (HOSPITAL_COMMUNITY): Payer: Self-pay | Admitting: Cardiology

## 2024-05-14 ENCOUNTER — Encounter (HOSPITAL_COMMUNITY)
Admission: RE | Disposition: A | Discharge status: Home / Self Care | Payer: Self-pay | Source: Home / Self Care | Attending: Cardiology

## 2024-05-14 ENCOUNTER — Other Ambulatory Visit: Payer: Self-pay

## 2024-05-14 DIAGNOSIS — Z79899 Other long term (current) drug therapy: Secondary | ICD-10-CM | POA: Diagnosis not present

## 2024-05-14 DIAGNOSIS — J45901 Unspecified asthma with (acute) exacerbation: Secondary | ICD-10-CM | POA: Diagnosis not present

## 2024-05-14 DIAGNOSIS — I2510 Atherosclerotic heart disease of native coronary artery without angina pectoris: Secondary | ICD-10-CM | POA: Diagnosis not present

## 2024-05-14 DIAGNOSIS — I480 Paroxysmal atrial fibrillation: Secondary | ICD-10-CM

## 2024-05-14 DIAGNOSIS — I5033 Acute on chronic diastolic (congestive) heart failure: Secondary | ICD-10-CM

## 2024-05-14 DIAGNOSIS — I110 Hypertensive heart disease with heart failure: Secondary | ICD-10-CM | POA: Diagnosis not present

## 2024-05-14 DIAGNOSIS — G4733 Obstructive sleep apnea (adult) (pediatric): Secondary | ICD-10-CM | POA: Insufficient documentation

## 2024-05-14 DIAGNOSIS — K219 Gastro-esophageal reflux disease without esophagitis: Secondary | ICD-10-CM | POA: Insufficient documentation

## 2024-05-14 DIAGNOSIS — D6859 Other primary thrombophilia: Secondary | ICD-10-CM | POA: Insufficient documentation

## 2024-05-14 DIAGNOSIS — I4891 Unspecified atrial fibrillation: Secondary | ICD-10-CM

## 2024-05-14 DIAGNOSIS — Z7901 Long term (current) use of anticoagulants: Secondary | ICD-10-CM | POA: Insufficient documentation

## 2024-05-14 DIAGNOSIS — E669 Obesity, unspecified: Secondary | ICD-10-CM | POA: Insufficient documentation

## 2024-05-14 DIAGNOSIS — I5032 Chronic diastolic (congestive) heart failure: Secondary | ICD-10-CM | POA: Insufficient documentation

## 2024-05-14 DIAGNOSIS — Z6830 Body mass index (BMI) 30.0-30.9, adult: Secondary | ICD-10-CM | POA: Insufficient documentation

## 2024-05-14 DIAGNOSIS — J441 Chronic obstructive pulmonary disease with (acute) exacerbation: Secondary | ICD-10-CM | POA: Diagnosis not present

## 2024-05-14 DIAGNOSIS — E785 Hyperlipidemia, unspecified: Secondary | ICD-10-CM | POA: Insufficient documentation

## 2024-05-14 DIAGNOSIS — I10 Essential (primary) hypertension: Secondary | ICD-10-CM | POA: Diagnosis not present

## 2024-05-14 DIAGNOSIS — I4819 Other persistent atrial fibrillation: Secondary | ICD-10-CM | POA: Diagnosis not present

## 2024-05-14 HISTORY — PX: CARDIOVERSION: EP1203

## 2024-05-14 SURGERY — CARDIOVERSION (CATH LAB)
Anesthesia: General

## 2024-05-14 MED ORDER — LIDOCAINE 2% (20 MG/ML) 5 ML SYRINGE
INTRAMUSCULAR | Status: DC | PRN
  Administered 2024-05-14: 100 mg via INTRAVENOUS

## 2024-05-14 MED ORDER — PROPOFOL 10 MG/ML IV BOLUS
INTRAVENOUS | Status: DC | PRN
  Administered 2024-05-14: 90 mg via INTRAVENOUS

## 2024-05-14 MED ORDER — SODIUM CHLORIDE 0.9 % IV SOLN: INTRAVENOUS | Status: DC

## 2024-05-14 SURGICAL SUPPLY — 1 items: PAD DEFIB RADIO PHYSIO CONN (PAD) ×2 IMPLANT

## 2024-05-14 NOTE — Anesthesia Preprocedure Evaluation (Signed)
 Anesthesia Evaluation  Patient identified by MRN, date of birth, ID band Patient awake    Reviewed: Allergy  & Precautions, NPO status , Patient's Chart, lab work & pertinent test results, reviewed documented beta blocker date and time   Airway Mallampati: II  TM Distance: >3 FB Neck ROM: Full    Dental  (+) Teeth Intact, Dental Advisory Given   Pulmonary asthma , sleep apnea , COPD,  COPD inhaler, former smoker   Pulmonary exam normal breath sounds clear to auscultation       Cardiovascular hypertension, Pt. on home beta blockers + CAD  + dysrhythmias Atrial Fibrillation  Rhythm:Irregular Rate:Abnormal     Neuro/Psych negative neurological ROS     GI/Hepatic Neg liver ROS,GERD  Medicated,,  Endo/Other  Obesity   Renal/GU      Musculoskeletal  abscess left little finger   Abdominal  (+) + obese  Peds  Hematology  (+) Blood dyscrasia (Eliquis )   Anesthesia Other Findings Day of surgery medications reviewed with the patient.  Reproductive/Obstetrics                             Anesthesia Physical Anesthesia Plan  ASA: 3  Anesthesia Plan: General   Post-op Pain Management: Minimal or no pain anticipated   Induction: Intravenous  PONV Risk Score and Plan: 2 and Propofol  infusion and Treatment may vary due to age or medical condition  Airway Management Planned: Natural Airway, Nasal Cannula and Mask  Additional Equipment:   Intra-op Plan:   Post-operative Plan:   Informed Consent: I have reviewed the patients History and Physical, chart, labs and discussed the procedure including the risks, benefits and alternatives for the proposed anesthesia with the patient or authorized representative who has indicated his/her understanding and acceptance.     Dental advisory given  Plan Discussed with: CRNA  Anesthesia Plan Comments:        Anesthesia Quick Evaluation

## 2024-05-14 NOTE — CV Procedure (Addendum)
   DIRECT CURRENT CARDIOVERSION  NAME:  Mario [name]    MRN: [id number] DOB:  06/08/1946    ADMIT DATE: 05/14/2024  Indication:  Symptomatic atrial fibrillation  Procedure Note:  The patient signed informed consent.  They have had had therapeutic anticoagulation with Eliquis  greater than 3 weeks.  Anesthesia was administered by Dr. Annabell Key.  Adequate airway was maintained throughout and vital followed per protocol.  They were cardioverted x 1 with 200J of biphasic synchronized energy.  They converted to NSR.  There were no apparent complications.  The patient had normal neuro status and respiratory status post procedure with vitals stable as recorded elsewhere.    Spoke to Genevia Kern (his wife) post procedure. All her questions answered.   Follow up: They will continue on current medical therapy and follow up with cardiology as scheduled.  Awilda Bogus, [facility]  A Division of [facility] [address], [place], [place] [address]  [place], [place] [address] 05/14/24 11:18 AM   Addendum:  At time of discharge patient endorsing tingling in bilateral thenar eminence.  RN called me to bedside  Physical exam:  Vital sign stable.  Non focal. A&o x4 Opens and closes his eyes willing and able to resist closure Smiles symmetrical  Rapid alternative movements appropriate.  Heel to toe bilaterally  Strength is 3+ bilateral UE and LE No aphasia - expressive or receptive    Stable to be d/c home.  Recommended to come back to ER if he has focal deficits.   Awilda Bogus, [facility]  A Division of [facility] [address], [place], [place] [address]  [place], [place] [address] 12:04 PM

## 2024-05-14 NOTE — Anesthesia Postprocedure Evaluation (Signed)
 Anesthesia Post Note  Patient: Mario [name]  Procedure(s) Performed: CARDIOVERSION     Patient location during evaluation: PACU Anesthesia Type: General Level of consciousness: awake and alert Pain management: pain level controlled Vital Signs Assessment: post-procedure vital signs reviewed and stable Respiratory status: spontaneous breathing, nonlabored ventilation and respiratory function stable Cardiovascular status: blood pressure returned to baseline and stable Postop Assessment: no apparent nausea or vomiting Anesthetic complications: no   No notable events documented.  Last Vitals:  Vitals:   05/14/24 1140 05/14/24 1145  BP: (!) 92/51   Pulse: 68 69  Resp: (!) 23 17  Temp: 36.6 C   SpO2: 96% 96%    Last Pain:  Vitals:   05/14/24 1140  TempSrc: Temporal  PainSc: 0-No pain   Pain Goal:                   Earvin Goldberg

## 2024-05-14 NOTE — Interval H&P Note (Signed)
 History and Physical Interval Note:  05/14/2024 10:39 AM  Mario [name]  has presented today for surgery, with the diagnosis of AFIB.  The various methods of treatment have been discussed with the patient and family. After consideration of risks, benefits and other options for treatment, the patient has consented to  Procedure(s): CARDIOVERSION (N/A) as a surgical intervention.  The patient's history has been reviewed, patient examined, no change in status, stable for surgery.  I have reviewed the patient's chart and labs.  Questions were answered to the patient's satisfaction.    No missed dose of Eliquis .  Contact person: Wife and daughter  Informed Consent   Shared Decision Making/Informed Consent The risks (stroke, cardiac arrhythmias rarely resulting in the need for a temporary or permanent pacemaker, skin irritation or burns and complications associated with conscious sedation including aspiration, arrhythmia, respiratory failure and death), benefits (restoration of normal sinus rhythm) and alternatives of a direct current cardioversion were explained in detail to [name] and he agrees to proceed.       Awilda Bogus, [facility]  A Division of [facility] [address] [place], [place] [address]  [place], [place] [address] 10:40 AM 05/14/24

## 2024-05-14 NOTE — Transfer of Care (Signed)
 Immediate Anesthesia Transfer of Care Note  Patient: Mario [name]  Procedure(s) Performed: CARDIOVERSION  Patient Location: Cath Lab  Anesthesia Type:General  Level of Consciousness: drowsy  Airway & Oxygen Therapy: Patient Spontanous Breathing and Patient connected to nasal cannula oxygen  Post-op Assessment: Report given to RN and Post -op Vital signs reviewed and stable  Post vital signs: Reviewed and stable  Last Vitals:  Vitals Value Taken Time  BP 93/70   Temp    Pulse 75   Resp 12   SpO2 95     Last Pain:  Vitals:   05/14/24 0954  TempSrc:   PainSc: 0-No pain         Complications: No notable events documented.

## 2024-05-15 ENCOUNTER — Other Ambulatory Visit: Payer: Self-pay

## 2024-05-15 DIAGNOSIS — J4489 Other specified chronic obstructive pulmonary disease: Secondary | ICD-10-CM

## 2024-05-15 DIAGNOSIS — J45901 Unspecified asthma with (acute) exacerbation: Secondary | ICD-10-CM

## 2024-05-15 DIAGNOSIS — R0602 Shortness of breath: Secondary | ICD-10-CM

## 2024-05-16 ENCOUNTER — Ambulatory Visit (HOSPITAL_BASED_OUTPATIENT_CLINIC_OR_DEPARTMENT_OTHER)
Admission: RE | Admit: 2024-05-16 | Discharge: 2024-05-16 | Disposition: A | Discharge status: Home / Self Care | Source: Ambulatory Visit | Attending: Family Medicine | Admitting: Family Medicine

## 2024-05-16 ENCOUNTER — Other Ambulatory Visit (HOSPITAL_BASED_OUTPATIENT_CLINIC_OR_DEPARTMENT_OTHER): Payer: Self-pay

## 2024-05-16 ENCOUNTER — Other Ambulatory Visit: Payer: Self-pay

## 2024-05-16 DIAGNOSIS — J4489 Other specified chronic obstructive pulmonary disease: Secondary | ICD-10-CM | POA: Diagnosis not present

## 2024-05-16 DIAGNOSIS — Z0389 Encounter for observation for other suspected diseases and conditions ruled out: Secondary | ICD-10-CM | POA: Diagnosis not present

## 2024-05-16 DIAGNOSIS — K449 Diaphragmatic hernia without obstruction or gangrene: Secondary | ICD-10-CM | POA: Diagnosis not present

## 2024-05-16 DIAGNOSIS — I771 Stricture of artery: Secondary | ICD-10-CM | POA: Diagnosis not present

## 2024-05-16 DIAGNOSIS — J45901 Unspecified asthma with (acute) exacerbation: Secondary | ICD-10-CM | POA: Insufficient documentation

## 2024-05-16 DIAGNOSIS — R0602 Shortness of breath: Secondary | ICD-10-CM | POA: Insufficient documentation

## 2024-05-16 DIAGNOSIS — M40204 Unspecified kyphosis, thoracic region: Secondary | ICD-10-CM | POA: Diagnosis not present

## 2024-05-17 ENCOUNTER — Ambulatory Visit: Payer: Self-pay

## 2024-05-17 DIAGNOSIS — J471 Bronchiectasis with (acute) exacerbation: Secondary | ICD-10-CM

## 2024-05-17 DIAGNOSIS — R053 Chronic cough: Secondary | ICD-10-CM

## 2024-05-17 DIAGNOSIS — J4550 Severe persistent asthma, uncomplicated: Secondary | ICD-10-CM

## 2024-05-17 NOTE — Telephone Encounter (Signed)
**Note De-identified  [place] Obfuscation** Please advise 

## 2024-05-17 NOTE — Telephone Encounter (Signed)
 Chief Complaint: cough Symptoms: cough and wheezing  Frequency: x weeks Pertinent Negatives: Patient denies fever or chest pain Disposition: [] ED /[x] Urgent Care (no appt availability in office) / [] Appointment(In office/virtual)/ []  [place] [facility]/ [] Home Care/ [] Refused Recommended Disposition /[]  Mobile Bus/ []  Follow-up with PCP  Additional Notes: Pt states that he has been having an ongoing issue with cough and was recently seen and just finished antibiotics for this and he is still coughing up green phlegm. States that he is currently experiencing some SOB with execration. States he currently experiencing some wheezing as well. States that he would like a f/u appt with Dr. Cheril Cork to discuss this x-ray, instructed patient the x-ray not released yet   Copied from CRM [id number]. Topic: Clinical - Red Word Triage >> May 17, 2024  9:35 AM Chrystal Crape R wrote: Coughing up green mucus, office sent pt for a a scan to check for pneumonia and her would like to come I'm as he hadn't heard anything back in two days. Reason for Disposition . Wheezing is present  Protocols used: Cough - Acute Productive-A-AH

## 2024-05-19 DIAGNOSIS — J449 Chronic obstructive pulmonary disease, unspecified: Secondary | ICD-10-CM | POA: Diagnosis not present

## 2024-05-19 DIAGNOSIS — J45909 Unspecified asthma, uncomplicated: Secondary | ICD-10-CM | POA: Diagnosis not present

## 2024-05-20 ENCOUNTER — Telehealth: Payer: Self-pay

## 2024-05-20 NOTE — Telephone Encounter (Signed)
 Please route to [facility], can you get him the cups for sputum collection at [facility]? Thanks

## 2024-05-20 NOTE — Telephone Encounter (Signed)
 Copied from CRM [id number]. Topic: Clinical - Request for Lab/Test Order >> May 20, 2024 10:35 AM Mario [name] wrote: Reason for CRM: Lestlie from [facility]. Patient has CT scheduled, however, patient would like to go to [place] location. Gurney Lefort is requesting location updated to [facility] in Epic and call patient to reschedule.  Routing to [facility], can pt CT be scheduled at [facility].

## 2024-05-20 NOTE — Telephone Encounter (Signed)
 Cups placed out front unable to notify pt as no VM established

## 2024-05-22 ENCOUNTER — Other Ambulatory Visit

## 2024-05-22 DIAGNOSIS — Z6830 Body mass index (BMI) 30.0-30.9, adult: Secondary | ICD-10-CM | POA: Diagnosis not present

## 2024-05-22 DIAGNOSIS — R053 Chronic cough: Secondary | ICD-10-CM | POA: Diagnosis not present

## 2024-05-22 DIAGNOSIS — J4550 Severe persistent asthma, uncomplicated: Secondary | ICD-10-CM

## 2024-05-22 DIAGNOSIS — J471 Bronchiectasis with (acute) exacerbation: Secondary | ICD-10-CM

## 2024-05-22 DIAGNOSIS — E669 Obesity, unspecified: Secondary | ICD-10-CM | POA: Diagnosis not present

## 2024-05-22 DIAGNOSIS — Z008 Encounter for other general examination: Secondary | ICD-10-CM | POA: Diagnosis not present

## 2024-05-22 DIAGNOSIS — R2681 Unsteadiness on feet: Secondary | ICD-10-CM | POA: Diagnosis not present

## 2024-05-24 ENCOUNTER — Ambulatory Visit: Payer: Self-pay | Admitting: Nurse Practitioner

## 2024-05-24 NOTE — Progress Notes (Signed)
 Inadequate sputum culture. Needs to recollect if still producing phlegm

## 2024-05-25 ENCOUNTER — Ambulatory Visit (HOSPITAL_BASED_OUTPATIENT_CLINIC_OR_DEPARTMENT_OTHER)
Admission: RE | Admit: 2024-05-25 | Discharge: 2024-05-25 | Disposition: A | Discharge status: Home / Self Care | Source: Ambulatory Visit | Attending: Nurse Practitioner | Admitting: Nurse Practitioner

## 2024-05-25 DIAGNOSIS — R918 Other nonspecific abnormal finding of lung field: Secondary | ICD-10-CM | POA: Diagnosis not present

## 2024-05-25 DIAGNOSIS — J4550 Severe persistent asthma, uncomplicated: Secondary | ICD-10-CM | POA: Insufficient documentation

## 2024-05-25 DIAGNOSIS — J9811 Atelectasis: Secondary | ICD-10-CM | POA: Diagnosis not present

## 2024-05-25 DIAGNOSIS — J471 Bronchiectasis with (acute) exacerbation: Secondary | ICD-10-CM | POA: Diagnosis not present

## 2024-05-25 DIAGNOSIS — R053 Chronic cough: Secondary | ICD-10-CM | POA: Insufficient documentation

## 2024-05-25 DIAGNOSIS — J479 Bronchiectasis, uncomplicated: Secondary | ICD-10-CM | POA: Diagnosis not present

## 2024-05-26 NOTE — Progress Notes (Unsigned)
 Cardiology Office Note    Date:  05/26/2024  ID:  Creston, Klas 1946/06/19, MRN [id number] PCP:  Austine Lefort, MD  Cardiologist:  Lauro Portal, MD  Electrophysiologist:  None   Chief Complaint: ***  History of Present Illness: Mario [name]    Mario [name] is a 78 y.o. male with visit-pertinent history of coronary calcification on CT, hyperlipidemia, hypertension, OSA on CPAP, bronchiectasis, persistent atrial fibrillation.  Patient was evaluated on 12/2017 for PAF.  Myoview  in 04/2019 was low restudy.  Echo in 10/2023 indicated LVEF of 50 to 55%, no RWMA, mild LVH, normal RV function, normal PASP.  In 01/2023 he is admitted with R A-fib with RVR treated with Cardizem  drip which caused hypotension.  Patient's home metoprolol  was increased and albuterol  was stopped.  Patient was admitted from 4/28 through 04/18/2024 for treatment of atrial fibrillation with RVR and COPD exacerbation with HFpEF exacerbation.  Echo on 04/16/2024 indicated LVEF of 50 to 55%, mild LVH, low normal RV function.  Treated with IV Lasix , steroid, bronchodilator, antibiotics.  Patient was discharged on diltiazem  240 mg daily, Eliquis  5 mg twice daily, p.o. Lasix  40 mg daily and Toprol  200 mg daily.  Patient was last seen on 04/15/2024 noting persistent fatigue which he attributed to metoprolol  dosing.  His rate was controlled but remained in atrial fibrillation.  Toprol  decreased to 100 mg daily, diltiazem  continued 2 to 4 mg daily.  Due to volume overload, Lasix  was increased to 60 mg daily for 3 days and instructed to return to 40 mg daily.  Cardiac PET was ordered due to dyspnea on exertion, scheduled for 06/11/2024.  Patient was seen by his PCP on 04/30/2024 with recurrent asthma flare and given 60 mg IM Depo-Medrol  and prolonged prednisone  taper.  He was subsequently started on Levaquin .  Due to elevated IgE he was referred to allergy  and anticipate prescription of biologic therapy.  Persistent COPD/asthma exacerbations  have been closely followed by PCP and pulmonary teams.  Patient was seen in clinic on 05/07/2024 by Neomi Banks, NP.  Patient was pending vitrectomy with silicone oil removal on 05/08/2024.  Patient was noted to be on last dose of Levaquin  but was not overall feeling better and had follow-up with his PCP that day.  Patient elected to delay his eye surgery and proceed with cardioversion.  Patient was successfully cardioverted from atrial fibrillation to normal sinus rhythm on 05/14/2024 with 200 J of biphasic synchronized energy.  Today he presents for follow-up.  He reports that he  Labwork independently reviewed:   ROS: .   *** denies chest pain, shortness of breath, lower extremity edema, fatigue, palpitations, melena, hematuria, hemoptysis, diaphoresis, weakness, presyncope, syncope, orthopnea, and PND.  All other systems are reviewed and otherwise negative.  Studies Reviewed: Mario [name]    EKG:  EKG is ordered today, personally reviewed, demonstrating ***     CV Studies: Cardiac studies reviewed are outlined and summarized above. Otherwise please see EMR for full report. Cardiac Studies & Procedures   ______________________________________________________________________________________________   STRESS TESTS  MYOCARDIAL PERFUSION IMAGING 05/15/2019  Narrative  Nuclear stress EF: 54%.  The left ventricular ejection fraction is mildly decreased (45-54%).  There was no ST segment deviation noted during stress.  There is a very subtle defect of mild severity present in the apex location. The defect is reversible but likely represents variations in diaphragmatic attenuation.  This is a low risk study.   ECHOCARDIOGRAM  ECHOCARDIOGRAM COMPLETE 04/16/2024  Narrative ECHOCARDIOGRAM REPORT  Patient Name:   Mario [name] Date of Exam: 04/16/2024 Medical Rec #:  [id number]       Height:       69.0 in Accession #:    [id number]      Weight:       216.0 lb Date of Birth:  Aug 09, 1946        BSA:          2.135 m Patient Age:    78 years        BP:           123/87 mmHg Patient Gender: M               HR:           97 bpm. Exam Location:  Inpatient  Procedure: 2D Echo, Cardiac Doppler and Color Doppler (Both Spectral and Color Flow Doppler were utilized during procedure).  Indications:    A Fib  History:        Patient has prior history of Echocardiogram examinations, most recent 10/31/2023. Arrythmias:Atrial Fibrillation; Risk Factors:Hypertension.  Sonographer:    Janette Medley Referring Phys: [id number] ANASTASSIA DOUTOVA  IMPRESSIONS   1. Left ventricular ejection fraction, by estimation, is 50 to 55%. The left ventricle has low normal function. Left ventricular endocardial border not optimally defined to evaluate regional wall motion. There is mild concentric left ventricular hypertrophy. Left ventricular diastolic function could not be evaluated. 2. Right ventricular systolic function is low normal. The right ventricular size is normal. 3. Left atrial size was moderately dilated. 4. The mitral valve is normal in structure. Trivial mitral valve regurgitation. No evidence of mitral stenosis. 5. The aortic valve is grossly normal. There is mild calcification of the aortic valve. There is mild thickening of the aortic valve. Aortic valve regurgitation is not visualized. Aortic valve sclerosis/calcification is present, without any evidence of aortic stenosis.  Comparison(s): No significant change from prior study. Prior images reviewed side by side.  FINDINGS Left Ventricle: Left ventricular ejection fraction, by estimation, is 50 to 55%. The left ventricle has low normal function. Left ventricular endocardial border not optimally defined to evaluate regional wall motion. The left ventricular internal cavity size was normal in size. There is mild concentric left ventricular hypertrophy. Left ventricular diastolic function could not be evaluated due to atrial fibrillation.  Left ventricular diastolic function could not be evaluated.  Right Ventricle: The right ventricular size is normal. No increase in right ventricular wall thickness. Right ventricular systolic function is low normal.  Left Atrium: Left atrial size was moderately dilated.  Right Atrium: Right atrial size was normal in size. Prominent Eustachian valve.  Pericardium: There is no evidence of pericardial effusion.  Mitral Valve: The mitral valve is normal in structure. Trivial mitral valve regurgitation. No evidence of mitral valve stenosis.  Tricuspid Valve: The tricuspid valve is normal in structure. Tricuspid valve regurgitation is mild.  Aortic Valve: The aortic valve is grossly normal. There is mild calcification of the aortic valve. There is mild thickening of the aortic valve. Aortic valve regurgitation is not visualized. Aortic valve sclerosis/calcification is present, without any evidence of aortic stenosis.  Pulmonic Valve: The pulmonic valve was not well visualized. Pulmonic valve regurgitation is not visualized.  Aorta: The aortic root and ascending aorta are structurally normal, with no evidence of dilitation.  IAS/Shunts: No atrial level shunt detected by color flow Doppler.   LEFT VENTRICLE PLAX 2D LVIDd:         4.10 cm  Diastology LVIDs:         2.90 cm   LV e' medial:    8.81 cm/s LV PW:         1.40 cm   LV E/e' medial:  7.2 LV IVS:        1.20 cm   LV e' lateral:   8.16 cm/s LVOT diam:     2.10 cm   LV E/e' lateral: 7.8 LV SV:         43 LV SV Index:   20 LVOT Area:     3.46 cm   RIGHT VENTRICLE             IVC RV S prime:     10.00 cm/s  IVC diam: 1.50 cm TAPSE (M-mode): 2.1 cm  LEFT ATRIUM             Index        RIGHT ATRIUM           Index LA Vol (A2C):   88.2 ml 41.32 ml/m  RA Area:     13.10 cm LA Vol (A4C):   70.3 ml 32.93 ml/m  RA Volume:   27.20 ml  12.74 ml/m LA Biplane Vol: 80.8 ml 37.85 ml/m AORTIC VALVE LVOT Vmax:   63.60 cm/s LVOT  Vmean:  44.700 cm/s LVOT VTI:    0.124 m  AORTA Ao Root diam: 3.30 cm Ao Asc diam:  3.50 cm  MV E velocity: 63.82 cm/s  TRICUSPID VALVE TR Peak grad:   28.5 mmHg TR Vmax:        267.00 cm/s  SHUNTS Systemic VTI:  0.12 m Systemic Diam: 2.10 cm  Karyl Paget Croitoru MD Electronically signed by Luana Rumple MD Signature Date/Time: 04/16/2024/1:42:20 PM    Final    MONITORS  CARDIAC EVENT MONITOR 01/08/2018  Narrative 1. NSR with PAF       ______________________________________________________________________________________________       Current Reported Medications:.    No outpatient medications have been marked as taking for the 05/29/24 encounter (Appointment) with Tabatha Razzano D, NP.    Physical Exam:    VS:  There were no vitals taken for this visit.   Wt Readings from Last 3 Encounters:  05/14/24 207 lb (93.9 kg)  05/10/24 211 lb (95.7 kg)  05/08/24 208 lb (94.3 kg)    GEN: Well nourished, well developed in no acute distress NECK: No JVD; No carotid bruits CARDIAC: ***RRR, no murmurs, rubs, gallops RESPIRATORY:  Clear to auscultation without rales, wheezing or rhonchi  ABDOMEN: Soft, non-tender, non-distended EXTREMITIES:  No edema; No acute deformity     Asessement and Plan:.     ***     Disposition: F/u with ***  Signed, Cullen Lahaie D Romeka Scifres, NP

## 2024-05-28 ENCOUNTER — Encounter: Payer: Self-pay | Admitting: Allergy and Immunology

## 2024-05-28 ENCOUNTER — Other Ambulatory Visit: Payer: Self-pay

## 2024-05-28 ENCOUNTER — Ambulatory Visit (INDEPENDENT_AMBULATORY_CARE_PROVIDER_SITE_OTHER): Payer: Self-pay | Admitting: Allergy and Immunology

## 2024-05-28 VITALS — BP 112/66 | HR 90 | Temp 98.3°F | Ht 63.0 in | Wt 211.5 lb

## 2024-05-28 DIAGNOSIS — I4891 Unspecified atrial fibrillation: Secondary | ICD-10-CM | POA: Diagnosis not present

## 2024-05-28 DIAGNOSIS — R0602 Shortness of breath: Secondary | ICD-10-CM

## 2024-05-28 DIAGNOSIS — J3089 Other allergic rhinitis: Secondary | ICD-10-CM | POA: Diagnosis not present

## 2024-05-28 DIAGNOSIS — J4489 Other specified chronic obstructive pulmonary disease: Secondary | ICD-10-CM

## 2024-05-28 DIAGNOSIS — R768 Other specified abnormal immunological findings in serum: Secondary | ICD-10-CM | POA: Diagnosis not present

## 2024-05-28 DIAGNOSIS — J4550 Severe persistent asthma, uncomplicated: Secondary | ICD-10-CM | POA: Diagnosis not present

## 2024-05-28 DIAGNOSIS — G4733 Obstructive sleep apnea (adult) (pediatric): Secondary | ICD-10-CM

## 2024-05-28 MED ORDER — DUPILUMAB 300 MG/2ML ~~LOC~~ SOSY
300.0000 mg | PREFILLED_SYRINGE | Freq: Once | SUBCUTANEOUS | Status: AC
  Administered 2024-05-28: 300 mg via SUBCUTANEOUS

## 2024-05-28 MED ORDER — BUDESONIDE-FORMOTEROL FUMARATE 160-4.5 MCG/ACT IN AERO: 2.0000 | INHALATION_SPRAY | Freq: Two times a day (BID) | RESPIRATORY_TRACT | 6 refills | Status: DC

## 2024-05-28 MED ORDER — RYALTRIS 665-25 MCG/ACT NA SUSP: NASAL | 3 refills | Status: DC

## 2024-05-28 NOTE — Addendum Note (Signed)
 Addended by: [name] on: 05/28/2024 01:48 PM   Modules accepted: Orders

## 2024-05-28 NOTE — Patient Instructions (Addendum)
  1. Use empty lung technique with Symbicort  use  2. Use a sample of Dupilumab today for severe asthma  3. Use Ryaltris - 2 sprays each nostril 2 times per day (SP)  4. Minimize caffeine + decongestants to minimize Afib activity  5. Blood - ANCA w/r, Allergic Bronchopulmonary Aspergillosis (ABPA) Profile II, IgA/G/M, area 2 aeroallergen profile   6. Continue all other medications. Visit with cardiology tomorrow  7. Obtain nocturnal oximetry study   8. Further treatment??

## 2024-05-28 NOTE — Progress Notes (Addendum)
 Immunotherapy   Patient Details  Name: Mario [name] MRN: [id number] Date of Birth: 1946-08-01  05/28/2024  Mario [name] started injections for  Dupixent. Loading dose 600mg  given. Consent signed and patient instructions given. Waited in the office 15 minutes with no issues.    [name] 05/28/2024, 11:53 AM

## 2024-05-28 NOTE — Progress Notes (Signed)
 Mario [name] - [place] - [place] - [place]   Dear Mario [name],  Thank you for referring Mario [name] to the [facility] [place]  on 05/28/2024.   Below is a summation of this patient's evaluation and recommendations.  Thank you for your referral. I will keep you informed about this patient's response to treatment.   If you have any questions please do not hesitate to contact me.   Sincerely,  Fabienne Holter, MD Allergy  / Immunology [facility] [place]    ______________________________________________________________________    NEW PATIENT NOTE  Referring Provider: Roetta Clarke, NP Primary Provider: Austine Lefort, MD Date of office visit: 05/28/2024    Subjective:   Chief Complaint:  Mario [name] (DOB: October 24, 1946) is a 78 y.o. male who presents to the clinic on 05/28/2024 with a chief complaint of COPD/Asthma, Allergic Rhinitis , Cough, Allergy  Testing (Pt. Is requesting for allergy  test), and Establish Care .     HPI: Athena Bland presents to this clinic in evaluation of breathing problems.  Apparently he has had a long history of issues tied up with wheezing and coughing and shortness of breath that has been a progressive issue especially over the course of the past decade.  It sounds as though he has been treated with systemic steroids on a very common basis over the course of the past several years and he is now on prednisone  10 mg a day for the past month.  He uses a combination inhaler on a consistent basis and also uses a short acting bronchodilator a few times per day yet still continues to have wheezing and coughing and shortness of breath.  He has very lifestyle-limiting exertional dyspnea and really cannot do any type of significant exertion.  In addition, he has some intermittent nasal congestion and drainage in his throat and some throat clearing and decreased ability to  taste although he can smell somewhat.  Apparently when he is treated with prednisone  his smell returns.  He has a history of atrial fibrillation and underwent cardioversion this year given his history of rapid ventricular response.  He has been treated with a combination of diltiazem  and metoprolol  he has an appointment to see his cardiologist tomorrow.  He has reflux disease that is treated with pantoprazole  twice a day and he believes that this is working quite well.  He smoked tobacco at a rate of 2 packs/day from [phone].  Past Medical History:  Diagnosis Date   Arthritis    Asthma    Atrial fibrillation (HCC)    Biliary colic 2014   Bronchiectasis (HCC)    2021   Cataract    COPD (chronic obstructive pulmonary disease) with chronic bronchitis (HCC)    Coronary artery calcification seen on CAT scan    Cyst of pancreas    1 cm (7/18), repeat ct in 12 months.     Hyperlipidemia    Hypertension    Obstructive sleep apnea    Renal disorder    Rhinitis    Sleep apnea    pt denies sleep apnea-lost weight    Past Surgical History:  Procedure Laterality Date   CARDIOVERSION N/A 05/14/2024   Procedure: CARDIOVERSION;  Surgeon: Olinda Bertrand, DO;  Location: [facility] INVASIVE [facility];  Service: Cardiovascular;  Laterality: N/A;   CHOLECYSTECTOMY     COLONOSCOPY  09/29/2014   INCISION AND DRAINAGE OF WOUND Left 03/13/2024   Procedure: IRRIGATION AND  DEBRIDEMENT WOUND;  Surgeon: Arvil Birks, MD;  Location: [facility];  Service: Orthopedics;  Laterality: Left;  IRRIGATION AND DEBRIDEMENT FIFTH FINGER LEFT   LAPAROSCOPIC CHOLECYSTECTOMY SINGLE SITE WITH INTRAOPERATIVE CHOLANGIOGRAM N/A 09/15/2019   Procedure: LAPAROSCOPIC CHOLECYSTECTOMY SINGLE SITE;  Surgeon: Candyce Champagne, MD;  Location: [facility] ORS;  Service: General;  Laterality: N/A;   LITHOTRIPSY  2000   for kidney stone    Allergies as of 05/28/2024       Reactions   Dust Mite Extract Other (See Comments)        Medication List     acetaminophen  500 MG tablet Commonly known as: TYLENOL  Take 1,000 mg by mouth every 6 (six) hours as needed for mild pain (pain score 1-3).   amoxicillin -clavulanate 875-125 MG tablet Commonly known as: AUGMENTIN  Take 1 tablet by mouth 2 (two) times daily.   azithromycin  250 MG tablet Commonly known as: ZITHROMAX  2 tabs poqday1, 1 tab poqday 2-5   benzonatate  200 MG capsule Commonly known as: TESSALON  Take 1 capsule (200 mg total) by mouth 3 (three) times daily as needed for cough.   budesonide -formoterol  160-4.5 MCG/ACT inhaler Commonly known as: Symbicort  Inhale 2 puffs into the lungs in the morning and at bedtime. Generic medication   Co Q-10 100 MG Caps Take 1 capsule by mouth daily.   diltiazem  240 MG 24 hr capsule Commonly known as: DILACOR XR  Take 1 capsule (240 mg total) by mouth daily.   Eliquis  5 MG Tabs tablet Generic drug: apixaban  TAKE 1 TABLET BY MOUTH TWICE A DAY   FIBER COMPLETE PO Take 1 tablet by mouth daily.   Flutter Devi Use as directed   furosemide  40 MG tablet Commonly known as: LASIX  Take 1 tablet (40 mg total) by mouth daily. Take additional 20 mg if your weight is 3 lbs in 1 day of 5 lbs in 1 week   guaiFENesin  600 MG 12 hr tablet Commonly known as: MUCINEX  Take 1 tablet (600 mg total) by mouth 2 (two) times daily.   levalbuterol  0.63 MG/3ML nebulizer solution Commonly known as: XOPENEX  Take 3 mLs (0.63 mg total) by nebulization 4 (four) times daily for 7 days, THEN 3 mLs (0.63 mg total) every 6 (six) hours as needed for wheezing or shortness of breath. Start taking on: Apr 18, 2024   levalbuterol  45 MCG/ACT inhaler Commonly known as: Xopenex  HFA Inhale 2 puffs into the lungs every 6 (six) hours as needed for wheezing.   metoprolol  succinate 100 MG 24 hr tablet Commonly known as: TOPROL -XL Take 1 tablet (100 mg total) by mouth daily. Take with or immediately following a meal.   montelukast  10 MG tablet Commonly known as:  SINGULAIR  Take 1 tablet (10 mg total) by mouth daily.   pantoprazole  40 MG tablet Commonly known as: PROTONIX  Take 1 tablet (40 mg total) by mouth 2 (two) times daily.   predniSONE  10 MG tablet Commonly known as: DELTASONE  Take 1 tablet (10 mg total) by mouth daily with breakfast.   rosuvastatin  20 MG tablet Commonly known as: CRESTOR  TAKE 1 TABLET BY MOUTH EVERY DAY **D/C LIPITOR**   vitamin C 1000 MG tablet Take 1,000 mg by mouth 2 (two) times daily.   Vitamin D3 125 MCG (5000 UT) Tabs Take 1 tablet by mouth 2 (two) times daily.    Review of systems negative except as noted in HPI / PMHx or noted below:  Review of Systems  Constitutional: Negative.   HENT: Negative.    Eyes: Negative.  Respiratory: Negative.    Cardiovascular: Negative.   Gastrointestinal: Negative.   Genitourinary: Negative.   Musculoskeletal: Negative.   Skin: Negative.   Neurological: Negative.   Endo/Heme/Allergies: Negative.   Psychiatric/Behavioral: Negative.      Family History  Problem Relation Age of Onset   Rheumatic fever Mother    Stroke Father    Atrial fibrillation Father    Colon polyps Father    Arthritis Sister    Colon cancer Neg Hx    Esophageal cancer Neg Hx    Rectal cancer Neg Hx    Stomach cancer Neg Hx     Social History   Socioeconomic History   Marital status: Married    Spouse name: Not on file   Number of children: Not on file   Years of education: Not on file   Highest education level: Bachelor's degree (e.g., BA, AB, BS)  Occupational History   Not on file  Tobacco Use   Smoking status: Former    Current packs/day: 0.00    Average packs/day: 2.0 packs/day for 11.0 years (22.0 ttl pk-yrs)    Types: Cigarettes    Start date: 12/20/1963    Quit date: 12/19/1974    Years since quitting: 49.4   Smokeless tobacco: Never  Vaping Use   Vaping status: Never Used  Substance and Sexual Activity   Alcohol use: Not Currently    Alcohol/week: 1.0 standard drink  of alcohol   Drug use: No   Sexual activity: Not on file  Other Topics Concern   Not on file  Social History Narrative   Not on file   Social Drivers of Health   Financial Resource Strain: Low Risk  (05/08/2024)   Overall Financial Resource Strain (CARDIA)    Difficulty of Paying Living Expenses: Not very hard  Food Insecurity: No Food Insecurity (05/08/2024)   Hunger Vital Sign    Worried About Running Out of Food in the Last Year: Never true    Ran Out of Food in the Last Year: Never true  Transportation Needs: No Transportation Needs (05/08/2024)   PRAPARE - Administrator, Civil Service (Medical): No    Lack of Transportation (Non-Medical): No  Physical Activity: Inactive (05/08/2024)   Exercise Vital Sign    Days of Exercise per Week: 0 days    Minutes of Exercise per Session: 0 min  Stress: No Stress Concern Present (05/08/2024)   [employer] of Occupational Health - Occupational Stress Questionnaire    Feeling of Stress : Not at all  Social Connections: Socially Integrated (05/08/2024)   Social Connection and Isolation Panel [NHANES]    Frequency of Communication with Friends and Family: More than three times a week    Frequency of Social Gatherings with Friends and Family: Three times a week    Attends Religious Services: More than 4 times per year    Active Member of Clubs or Organizations: Yes    Attends Banker Meetings: 1 to 4 times per year    Marital Status: Married  Catering manager Violence: Not At Risk (05/08/2024)   Humiliation, Afraid, Rape, and Kick questionnaire    Fear of Current or Ex-Partner: No    Emotionally Abused: No    Physically Abused: No    Sexually Abused: No    Environmental and Social history  Lives in a house with a dry environment, dog located inside the household, no carpet in the bedroom, plastic on the bed, plastic on the pillow,  no smoking ongoing with inside the household.  Objective:   Vitals:    05/28/24 1012  BP: 112/66  Pulse: 90  Temp: 98.3 F (36.8 C)  SpO2: 95%   Height: 5\' 3"  (160 cm) Weight: 211 lb 8 oz (95.9 kg)  Physical Exam Constitutional:      Appearance: He is not diaphoretic.  HENT:     Head: Normocephalic.     Right Ear: Tympanic membrane, ear canal and external ear normal.     Left Ear: Tympanic membrane, ear canal and external ear normal.     Nose: Nose normal. No mucosal edema or rhinorrhea.     Mouth/Throat:     Pharynx: Uvula midline. No oropharyngeal exudate.  Eyes:     Conjunctiva/sclera: Conjunctivae normal.  Neck:     Thyroid : No thyromegaly.     Trachea: Trachea normal. No tracheal tenderness or tracheal deviation.  Cardiovascular:     Rate and Rhythm: Normal rate and regular rhythm.     Heart sounds: Normal heart sounds, S1 normal and S2 normal. No murmur heard. Pulmonary:     Effort: No respiratory distress.     Breath sounds: Normal breath sounds. No stridor. No wheezing (Bilateral inspiratory/expiratory wheezing all lung fields) or rales.  Lymphadenopathy:     Head:     Right side of head: No tonsillar adenopathy.     Left side of head: No tonsillar adenopathy.     Cervical: No cervical adenopathy.  Skin:    Findings: No erythema or rash.     Nails: There is no clubbing.  Neurological:     Mental Status: He is alert.     Diagnostics: Allergy  skin tests were not performed.   Oxygen saturation at rest on room air was 95%.  Oxygen saturation while walking the hallway on room air was 94%.  Results of a chest x-ray obtained 16 May 2024 identifies the following:  Flattening of the diaphragms which can be seen in emphysema. Streaky scarring or atelectasis in the left lung base again noted. No pleural effusion or pneumothorax. No cardiomegaly. Tortuous aorta with aortic atherosclerosis. No acute fracture or destructive lesion. Focal thoracic kyphosis with severe chronic compression fracture in the lower thoracic spine.  Moderate-sized hiatal hernia.  Results of a chest CT scan obtained 05 December 2023 identified the following:  Cardiovascular: Aortic atherosclerosis. Normal heart size. Three-vessel coronary artery calcifications. No pericardial effusion.  Mediastinum/Nodes: No enlarged mediastinal, hilar, or axillary lymph nodes. Moderate hiatal hernia with intrathoracic position of the gastric fundus. Thyroid  gland, trachea, and esophagus demonstrate no significant findings.  Lungs/Pleura: Diffuse bilateral bronchial wall thickening with interval increase in scarring and volume loss of the left lung base, similar scarring of the right lung base. Bronchiolar plugging throughout the left lung base as well as small irregular infectious or inflammatory nodules in the dependent left lower lobe. No pleural effusion or pneumothorax. Other occasional small pulmonary nodules unchanged and definitively benign, requiring no specific further follow-up or characterization. Mild lobular air trapping on expiratory phase imaging.  Results of a sinus CT scan obtained 14 August 2023 identified the following:  Paranasal sinuses:  Frontal: Normally aerated. Patent frontal sinus drainage pathways.  Ethmoid: Mild mucosal disease in the anterior ethmoid air cells bilaterally.  Maxillary: Unchanged moderate mucosal disease in the right maxillary sinus. Interval resolution of left maxillary sinus disease.  Sphenoid: Normally aerated. Patent sphenoethmoidal recesses.  Right ostiomeatal unit: Occluded by mucosal disease, unchanged.  Left ostiomeatal unit: Patent.  Nasal  passages: Patent. Intact nasal septum is midline.  Anatomy: No pneumatization superior to anterior ethmoid notches. Symmetric and intact olfactory grooves and fovea ethmoidalis, Keros II (4-39mm). Sellar sphenoid pneumatization pattern. No dehiscence of carotid or optic canals. No onodi cell.  Other: Orbits and intracranial compartment are unremarkable.  Visible mastoid air cells are normally aerated.  Results of blood test obtained 30 Apr 2024 identifies IgE 1423 KU/L  Results of blood test obtained 05 July 2018 identifies alpha 1 antitrypsin 171 Mg/DL  Results of blood test obtained 07 May 2024 identifies WBC 14.7, absolute eosinophils 0, absolute lymphocyte 5800, absolute neutrophil 13,201, hemoglobin 13.8, platelet 275  Results of blood test obtained 05 July 2018 identifies IgE antibodies directed against dust mite, grass and Aspergillus with a titer of 8.09 KU/L  Results of a sputum culture obtained 08 July 2020 identified Aspergillus [place] and Candida albicans  Results of pulmonary function test obtained 13 August 2018 identified TLC 88% predicted, RV 120% predicted, DL/VA 161% predicted  Assessment and Plan:    1. Not well controlled severe persistent asthma   2. COPD with asthma (HCC)   3. Perennial allergic rhinitis   4. Elevated IgE level   5. Atrial fibrillation with rapid ventricular response (HCC)   6. OSA (obstructive sleep apnea)    1. Use empty lung technique with Symbicort  use  2. Use a sample of Dupilumab today for severe asthma  3. Use Ryaltris - 2 sprays each nostril 2 times per day (SP)  4. Minimize caffeine + decongestants to minimize Afib activity  5. Blood - ANCA w/r, Allergic Bronchopulmonary Aspergillosis (ABPA) Profile II, IgA/G/M, area 2 aeroallergen profile   6. Continue all other medications. Visit with cardiology tomorrow  7. Obtain nocturnal oximetry study   8. Further treatment??  Athena Bland has significant inflammation of his airway and his symptomatology is probably based on the sequela of this inflammation plus possibly a contribution from his cardiac disease.  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  We reviewed his lung technique today when using Symbicort .  We started him on dupilumab today and we will coordinate with pulmonology about continuing that form of biologic agent.  I did make some suggestions to him about minimizing his A-fib activity by minimizing caffeine consumption and also not using any decongestants, 2 agents of which he actually does use commonly.  He has an appointment to see cardiology tomorrow.  Given his A-fib and the results of his previous sleep study documenting sleep apnea we will see if he is developing significant nocturnal hypoxemia by checking a nocturnal oximetry study.  I have not really changed any of his therapy other than administering the dupilumab and starting him on a combination nasal steroid nasal antihistamine.  I will contact him with the results of his blood test once they are available for review.  Fabienne Holter, MD Allergy  / Immunology [facility] [place] 

## 2024-05-29 ENCOUNTER — Other Ambulatory Visit: Payer: Self-pay

## 2024-05-29 ENCOUNTER — Ambulatory Visit: Attending: Cardiology | Admitting: Cardiology

## 2024-05-29 ENCOUNTER — Encounter: Payer: Self-pay | Admitting: Cardiology

## 2024-05-29 VITALS — BP 112/72 | HR 93 | Ht 69.0 in | Wt 211.8 lb

## 2024-05-29 DIAGNOSIS — D6859 Other primary thrombophilia: Secondary | ICD-10-CM | POA: Diagnosis not present

## 2024-05-29 DIAGNOSIS — E785 Hyperlipidemia, unspecified: Secondary | ICD-10-CM

## 2024-05-29 DIAGNOSIS — G4733 Obstructive sleep apnea (adult) (pediatric): Secondary | ICD-10-CM | POA: Diagnosis not present

## 2024-05-29 DIAGNOSIS — I4819 Other persistent atrial fibrillation: Secondary | ICD-10-CM | POA: Diagnosis not present

## 2024-05-29 DIAGNOSIS — I2510 Atherosclerotic heart disease of native coronary artery without angina pectoris: Secondary | ICD-10-CM

## 2024-05-29 DIAGNOSIS — I10 Essential (primary) hypertension: Secondary | ICD-10-CM

## 2024-05-29 DIAGNOSIS — J4550 Severe persistent asthma, uncomplicated: Secondary | ICD-10-CM

## 2024-05-29 DIAGNOSIS — R768 Other specified abnormal immunological findings in serum: Secondary | ICD-10-CM

## 2024-05-29 DIAGNOSIS — I5032 Chronic diastolic (congestive) heart failure: Secondary | ICD-10-CM | POA: Diagnosis not present

## 2024-05-29 DIAGNOSIS — R0602 Shortness of breath: Secondary | ICD-10-CM

## 2024-05-29 NOTE — Progress Notes (Signed)
 Electrophysiology Office Note:    Date:  05/30/2024   ID:  Mario [name], DOB 18-Apr-1946, MRN [id number]  PCP:  Austine Lefort, MD   [facility] Providers Cardiologist:  Lauro Portal, MD     Referring MD: Austine Lefort, MD   History of Present Illness:    Mario [name] is a 78 y.o. male with a medical history significant for persistent atrial fibrillation, coronary calcification, obstructive sleep apnea on CPAP, hypertension, hyperlipidemia, referred for management of atrial fibrillation.     He was diagnosed with atrial fibrillation in 2019.  Prior to this he experience episodes of palpitations on and off for 2 or so years.  A monitor was placed that did document atrial fibrillation.  He was admitted with atrial fibrillation and RVR in February 2024.  He was discharged with metoprolol  and taken off albuterol .  He was again admitted in April and May 2025 with atrial fibrillation RVR in the setting of COPD exacerbation.  He underwent DC cardioversion on May 14, 2024.  The patient believes that he converted back to atrial fibrillation the day following his cardioversion.   History of Present Illness          Today, he reports that he is at baseline.  He has shortness of breath and fatigue with exertion.  He has been battling COPD and was recently started on dupixent, which has been helping significantly.  EKGs/Labs/Other Studies Reviewed Today:     Echocardiogram:  TTE April 2025 V EF 50 to 55%.  Left atrial size is moderately dilated.  Aortic sclerosis without stenosis.    Advanced imaging:  NM PET CT cardiac --scheduled Results pending    EKG:         Physical Exam:    VS:  BP 92/60   Pulse 96   Ht 5' 8 (1.727 m)   Wt 212 lb (96.2 kg)   SpO2 95%   BMI 32.23 kg/m     Wt Readings from Last 3 Encounters:  05/30/24 212 lb (96.2 kg)  05/29/24 211 lb 12.8 oz (96.1 kg)  05/28/24 211 lb 8 oz (95.9 kg)     GEN: Well nourished, well  developed in no acute distress CARDIAC: iRRR, no murmurs, rubs, gallops RESPIRATORY:  Normal work of breathing MUSCULOSKELETAL: no edema    ASSESSMENT & PLAN:     Persistent atrial fibrillation Symptomatic with fatigue Symptoms are somewhat confounded by pulmonary disease Had rapid return of A-fib after cardioversion Medical options are limited by coronary calcification and pulmonary disease Tikosyn would be an option though he is on multiple other medications We discussed management options, and he would prefer to undergo ablation if the risk of a general anesthesia is acceptable. I will seek feedback from his pulmonologist and our anesthesiology colleagues.  We discussed the indication, rationale, logistics, anticipated benefits, and potential risks of the ablation procedure including but not limited to -- bleed at the groin access site, chest pain, damage to nearby organs such as the diaphragm, lungs, or esophagus, need for a drainage tube, or prolonged hospitalization. I explained that the risk for stroke, heart attack, need for open chest surgery, or even death is very low but not zero. he  expressed understanding and wishes to proceed.   CHF with preserved ejection fraction Symptomatic with fatigue I suspect atrial fibrillation is contributing  COPD and asthma Has had a history of frequent flares  Obstructive sleep apnea Compliance to CPAP encouraged  Coronary calcification Noted  on CTs dating back to 2019. Cardiac PET scheduled for June 07, 2024    Signed, Efraim Grange, MD  05/30/2024 11:16 AM    [facility]

## 2024-05-29 NOTE — Patient Instructions (Addendum)
 Medication Instructions:  No changes *If you need a refill on your cardiac medications before your next appointment, please call your pharmacy*  Lab Work: No labs  Testing/Procedures: No testing  Follow-Up: At [facility], you and your health needs are our priority.  As part of our continuing mission to provide you with exceptional heart care, our providers are all part of one team.  This team includes your primary Cardiologist (physician) and Advanced Practice Providers or APPs (Physician Assistants and Nurse Practitioners) who all work together to provide you with the care you need, when you need it.  Your next appointment:   3 week(s)  Provider:   Slater Duncan, NP or Neomi Banks, NP    We recommend signing up for the patient portal called MyChart.  Sign up information is provided on this After Visit Summary.  MyChart is used to connect with patients for Virtual Visits (Telemedicine).  Patients are able to view lab/test results, encounter notes, upcoming appointments, etc.  Non-urgent messages can be sent to your provider as well.   To learn more about what you can do with MyChart, go to [email or web address].

## 2024-05-30 ENCOUNTER — Encounter: Payer: Self-pay | Admitting: Cardiovascular Disease

## 2024-05-30 ENCOUNTER — Encounter: Payer: Self-pay | Admitting: Allergy and Immunology

## 2024-05-30 ENCOUNTER — Telehealth: Payer: Self-pay

## 2024-05-30 ENCOUNTER — Ambulatory Visit: Attending: Cardiovascular Disease | Admitting: Cardiovascular Disease

## 2024-05-30 VITALS — BP 92/60 | HR 96 | Ht 68.0 in | Wt 212.0 lb

## 2024-05-30 DIAGNOSIS — I4819 Other persistent atrial fibrillation: Secondary | ICD-10-CM

## 2024-05-30 NOTE — Patient Instructions (Signed)
 Medication Instructions:  Your physician recommends that you continue on your current medications as directed. Please refer to the Current Medication list given to you today. *If you need a refill on your cardiac medications before your next appointment, please call your pharmacy*  Lab Work: CBC and BMET - please plan to have pre-procedure lab work completed on Tuesday, July 8 (or a day or two after is fine as well) This can be completed at ANY [facility] near you - no appointment needed and this does not have to be fasting If you have labs (blood work) drawn today and your tests are completely normal, you will receive your results only by: MyChart Message (if you have MyChart) OR A paper copy in the mail If you have any lab test that is abnormal or we need to change your treatment, we will call you to review the results.  Testing/Procedures: AF ablation - tentatively scheduled for Thursday, August 7 - we are confirming with our anesthesia team on being able to move forward with this - we will be in contact with you to confirm Your physician has recommended that you have an ablation. Catheter ablation is a medical procedure used to treat some cardiac arrhythmias (irregular heartbeats). During catheter ablation, a long, thin, flexible tube is put into a blood vessel in your groin (upper thigh), or neck. This tube is called an ablation catheter. It is then guided to your heart through the blood vessel. Radio frequency waves destroy small areas of heart tissue where abnormal heartbeats may cause an arrhythmia to start. Please see the instruction sheet given to you today.    Follow-Up: At [facility], you and your health needs are our priority.  As part of our continuing mission to provide you with exceptional heart care, our providers are all part of one team.  This team includes your primary Cardiologist (physician) and Advanced Practice Providers or APPs (Physician Assistants and Nurse  Practitioners) who all work together to provide you with the care you need, when you need it.  Your next appointment:   We will schedule follow up after your ablation   Provider:   Marlane Silver, MD

## 2024-05-30 NOTE — Telephone Encounter (Signed)
 Received form in Onbase.

## 2024-05-30 NOTE — Telephone Encounter (Signed)
 Received Mario [name] new start paperwork in Onbase  However it looks like Dr. Jerelene Monday is arranging Dupixent based on joint decision. Patient received first Dupixent dose at Dr. Zenia Hight office on 05/28/2024  Routing to Canary Ceo, NP as Micky Albee, PharmD, MPH, BCPS, CPP Clinical Pharmacist (Rheumatology and Pulmonology)

## 2024-05-31 NOTE — Telephone Encounter (Signed)
 Pt called in stating his surgery was r/s for 06/13/24 and asked if he is cleared to proceed, please advise.

## 2024-05-31 NOTE — Telephone Encounter (Signed)
 Left a message for the surgery scheduler to call back ASAP to discuss further for preop clearance.     Surgery scheduler [place] called back. I reviewed notes from preop APP: Clearnce Curia, NP    05/07/24  7:49 PM Note Seen in clinic 05/07/24. Due to persistent symptomatic atrial fibrillation, he was set up for cardioversion 05/14/24. Discussed need to completed 4 weeks of anticoagulation without interruption post cardioversion. He requested to delay vitrectomy.    Will require uninterrupted anticoagulation 05/07/24 - 06/12/24. Additionally, has cardiac PET scheduled for 06/11/24 which will help to stratify pre-procedure risk.    Will readdress clearance after cardiac PET 06/11/24.    Routed to requesting provider so they are aware.   Clearnce Curia, NP      I did tell [place] I can see if I can get him in a bit sooner after PET CT has been done and read by MD. Otherwise the pt will not be cleared until he has been seen. Also see notes above.   [place] thanked me for reaching out to her and updating their office, both offices are in agreement to be sure the pt has been cleared from a cardiac standpoint before being able to proceed. [place] asked if I could fax notes to her as well. I confirmed fax #.

## 2024-05-31 NOTE — Telephone Encounter (Signed)
 Left message for pt to call back to discuss preop clearance. See previous notes, I just s/w surgery scheduler [place].

## 2024-06-01 ENCOUNTER — Other Ambulatory Visit: Payer: Self-pay | Admitting: Family Medicine

## 2024-06-01 DIAGNOSIS — J4489 Other specified chronic obstructive pulmonary disease: Secondary | ICD-10-CM

## 2024-06-01 DIAGNOSIS — K219 Gastro-esophageal reflux disease without esophagitis: Secondary | ICD-10-CM

## 2024-06-03 NOTE — Telephone Encounter (Addendum)
 Spoke with pt and informed pt that of information from Neomi Banks, NP on 05/07/24. Offered sooner appt 06/17/24 with Slater Duncan, NP for f/u for PET scan and Preop appt.   Informed Pt that the surgeons office is aware of all this information.  Will update surgeons office.

## 2024-06-04 ENCOUNTER — Ambulatory Visit: Payer: Self-pay | Admitting: Allergy and Immunology

## 2024-06-04 ENCOUNTER — Ambulatory Visit: Admitting: Nurse Practitioner

## 2024-06-04 ENCOUNTER — Encounter: Payer: Self-pay | Admitting: Nurse Practitioner

## 2024-06-04 VITALS — BP 108/84 | HR 90 | Ht 68.0 in | Wt 211.8 lb

## 2024-06-04 DIAGNOSIS — R5381 Other malaise: Secondary | ICD-10-CM | POA: Diagnosis not present

## 2024-06-04 DIAGNOSIS — J320 Chronic maxillary sinusitis: Secondary | ICD-10-CM | POA: Diagnosis not present

## 2024-06-04 DIAGNOSIS — Z87891 Personal history of nicotine dependence: Secondary | ICD-10-CM | POA: Diagnosis not present

## 2024-06-04 DIAGNOSIS — J479 Bronchiectasis, uncomplicated: Secondary | ICD-10-CM | POA: Diagnosis not present

## 2024-06-04 DIAGNOSIS — J4550 Severe persistent asthma, uncomplicated: Secondary | ICD-10-CM

## 2024-06-04 DIAGNOSIS — I480 Paroxysmal atrial fibrillation: Secondary | ICD-10-CM

## 2024-06-04 DIAGNOSIS — S22080S Wedge compression fracture of T11-T12 vertebra, sequela: Secondary | ICD-10-CM

## 2024-06-04 LAB — ALLERGENS W/TOTAL IGE AREA 2
Alternaria Alternata IgE: 1.21 kU/L — AB
Bermuda Grass IgE: 0.19 kU/L — AB
Cat Dander IgE: <0.1 kU/L
Cedar, Mountain IgE: <0.1 kU/L
Cladosporium Herbarum IgE: 0.12 kU/L — AB
Cockroach, German IgE: <0.1 kU/L
Common Silver Birch IgE: <0.1 kU/L
Cottonwood IgE: 0.3 kU/L — AB
D Farinae IgE: 1.3 kU/L — AB
D Pteronyssinus IgE: 1.45 kU/L — AB
Dog Dander IgE: <0.1 kU/L
Elm, American IgE: <0.1 kU/L
Johnson Grass IgE: 0.21 kU/L — AB
Maple/Box Elder IgE: <0.1 kU/L
Mouse Urine IgE: <0.1 kU/L
Oak, White IgE: <0.1 kU/L
Pecan, Hickory IgE: <0.1 kU/L
Penicillium Chrysogen IgE: 1.56 kU/L — AB
Pigweed, Rough IgE: <0.1 kU/L
Ragweed, Short IgE: <0.1 kU/L
Sheep Sorrel IgE Qn: <0.1 kU/L
Timothy Grass IgE: 1.16 kU/L — AB
White Mulberry IgE: <0.1 kU/L

## 2024-06-04 LAB — ALLERGIC BRONCHOPULMONARY ASPERGILLOSIS PROFILE II
Aspergillus Fumigatus IgE: 7.29 kU/L — AB
Aspergillus fumigatus IgG: 20.2 mg/L (ref 0.0–50.6)
Basophils Absolute: 0 10*3/uL (ref 0.0–0.2)
Basos: 0 %
EOS (ABSOLUTE): 0 10*3/uL (ref 0.0–0.4)
Eos: 0 %
Hematocrit: 41 % (ref 37.5–51.0)
Hemoglobin: 13.1 g/dL (ref 13.0–17.7)
IgE (Immunoglobulin E), Serum: 882 [IU]/mL — ABNORMAL HIGH (ref 6–495)
Immature Grans (Abs): 0.2 10*3/uL — ABNORMAL HIGH (ref 0.0–0.1)
Immature Granulocytes: 1 %
Lymphocytes Absolute: 0.6 10*3/uL — ABNORMAL LOW (ref 0.7–3.1)
Lymphs: 5 %
MCH: 28.8 pg (ref 26.6–33.0)
MCHC: 32 g/dL (ref 31.5–35.7)
MCV: 90 fL (ref 79–97)
Monocytes Absolute: 0.5 10*3/uL (ref 0.1–0.9)
Monocytes: 5 %
Neutrophils Absolute: 9.1 10*3/uL — ABNORMAL HIGH (ref 1.4–7.0)
Neutrophils: 89 %
Platelets: 264 10*3/uL (ref 150–450)
RBC: 4.55 x10E6/uL (ref 4.14–5.80)
RDW: 17 % — ABNORMAL HIGH (ref 11.6–15.4)
WBC: 10.4 10*3/uL (ref 3.4–10.8)

## 2024-06-04 LAB — IGG, IGA, IGM
IgA/Immunoglobulin A, Serum: 244 mg/dL (ref 61–437)
IgG (Immunoglobin G), Serum: 545 mg/dL — ABNORMAL LOW (ref 603–1613)
IgM (Immunoglobulin M), Srm: 35 mg/dL (ref 15–143)

## 2024-06-04 LAB — ANCA PROFILE (RDL)
ANCA by IFA (RDL): NEGATIVE
Anti-MPO Ab (RDL): <20 U (ref <20)
Anti-PR-3 Ab (RDL): <20 U (ref <20)

## 2024-06-04 NOTE — Progress Notes (Signed)
 @Patient  ID: Mario [name], male    DOB: 09-17-1946, 78 y.o.   MRN: [id number]  Chief Complaint  Patient presents with   Follow-up    Asthma is poorly controlled    Referring provider: [name] DASEN, MD  HPI: 78 year old male, former smoker followed for COPD/chronic asthma and bronchiectasis.  He will be a patient of Dr. Luann and last via virtual visit 05/10/2024 by [facility] NP. Past medical history significant for A-fib RVR on Eliquis , hypertension, CAD, chronic sinusitis, GERD, HLD.  TEST/EVENTS:  08/13/2018 PFT: FVC 73, FEV1 63, ratio 62, TLC 88, DLCO 74.  Moderate obstruction with moderate diffusing defect 08/2020 sputum culture positive for pansensitive pseudomonas  03/24/2023 sputum culture positive for serratia liquefaciens resistant to augmentin  and cefazolin  03/2023 eos 304  08/14/2023 CT sinus: Unchanged moderate mucosal disease in right maxillary sinus.  Interval resolution of left maxillary sinus disease.  Unchanged occlusion of the right ostiomeatal unit by mucosal disease 10/31/2023 echo: EF 50 to 55%.  RV size and function is normal.  Normal PASP.  LA mildly dilated. 12/05/2023 HRCT chest: Atherosclerosis.  Moderate hiatal hernia.  Diffuse bilateral bronchial wall thickening.  Increase in scarring and volume loss of left lung base.  Similar scarring of right lung base.  Bronchial plugging throughout the left lung base as well as small irregular infectious or inflammatory nodules in the left lower lobe.  Small pulmonary nodules, unchanged and definitively benign.  Mild lobular air trapping. 04/15/2024 CXR: mild bibasilar scarring/atelectasis, unchanged.  04/16/2024 echo: EF 50-55%. RV function low normal, size nl. LA moderately dilated. Trivial MR.  04/30/2024 IgE: 1423 05/25/2024 CT chest: atherosclerosis. Trace pericardial fluid vs thickening. Moderately large hiatal hernia. Interval progression of b/l LL bronchial wall thickening and developing btx with associated volume loss  and linear pleuroparenchymal scarring. Similar findings in RML and lingula. Few scattered calcified pulmonary nodules are present; almost certainly benign. Interval development of T12 compression fx.  05/28/2024 allergen panel positive to dust, grasses, fungi, cottonwood 05/28/2024 IgG low at 545  02/07/2024: Virtual visit with Dr. Darlean.  Maintained on Symbicort .  Finishing Cipro  and reports that mucus is much lighter but still with severe cough.  Advised to reduce Symbicort  to 1 puff twice daily.  Increase nebulizer up to every 4 hours as needed.  Advised to use Mucinex  DM for cough and congestion.  04/12/2024: SHERLEAN with Sharion Grieves NP Discussed the use of AI scribe software for clinical note transcription with the patient, who gave verbal consent to proceed. [name] is a 78 year old male with bronchiectasis and COPD/chronic asthma who presents for surgical risk assessment prior to retina surgery.  However, he is having acute symptoms with worsening shortness of breath and cough. He has experienced worsening shortness of breath and cough since his finger surgery on March 13, 2024. He reports significant weakness and dyspnea, requiring rest after walking short distances. He uses his rescue inhaler daily and sleeps in a recliner due to wheezing.  The rescue inhaler does seem to help some.  He has a significant cough, which is increased in frequency.  Has some trouble getting phlegm up in the morning and tends to use his rescue inhaler, which does help open him up.  Sputum is clear, which has improved since he completed courses of doxycycline  and Cipro .  He is currently on prednisone  10 mg daily, prescribed by his primary care doctor.  Feels like this is helping slightly.  Has not had a recent chest  x-ray. He has a history of bronchiectasis.  He is a former smoker with moderate obstruction on prior PFT.  Does carry diagnosis of asthma as well.  Uses Symbicort  twice daily. He discontinued Spiriva  due to  lack of improvement. He takes montelukast  and Mucinex  DM daily. Not currently taking a daily allergy  pill. Does not feel like his sinuses are troublesome right now. He reports leg swelling managed with Lasix . No recent increase. No palpitations or CP.  No fever, chills, or hemoptysis. Oxygen levels have been stable, and he has not experienced hypoxemia. He has a history of allergy  testing but does not currently see an allergist.  His doctor mentioned a shot to help with his breathing problems. He is wondering if this would be an option for him. Highest peripheral eosinophil count documented 304 in April 2024.  His upcoming surgery is not under anesthesia. To his knowledge, he is supposed to be awake. It is currently scheduled for 5/1.  FeNO 26 ppb    05/10/2024:OV with Ahja Martello NP Discussed the use of AI scribe software for clinical note transcription with the patient, who gave verbal consent to proceed. [name] is a 77 year old male who presents for follow up with his wife.  He has been experiencing significant asthma/COPD flare-ups, which have been difficult to manage and have resulted in frequent use of steroids/abx. His IgE levels were significantly elevated at 1400 upon recent check by his PCP. Eos were 0 but he was on prednisone . He has a history of elevation up to 304 on prior checks.  He's had numerous flares over the past few months with productive cough. He gets short winded and has wheezing. No fevers, chills, hemoptysis.  He is also having issues with a fib and planned for cardioversion next Tuesday. No CP, syncope, dizziness.  After our last visit, he had improved but then started having issues with productive cough with green phlegm again. His PCP started him back on augmentin  and prednisone , which he is preparing to complete. He feels better. Still has some chest tightness and cough, but the cough is dry and he is no longer producing any phlegm. He is currently using Mucinex .  Uses Symbicort  Twice daily, montelukast , allergy  pill. On 10 mg prednisone  currently.  He was having some issues with leg swelling, which has improved. Monitors his weight daily to manage fluid retention. No low oxygen levels despite feeling short-winded.  FeNO 26 ppb   06/04/2024: Today - follow up Discussed the use of AI scribe software for clinical note transcription with the patient, who gave verbal consent to proceed.  History of Present Illness   Onyekachi Gathright is a 78 year old male with chronic respiratory issues who presents for follow up.   He experiences persistent congestion but overall improved compared to his last visit. He uses inhalers daily and notes a reduction in productive cough. No longer producing any phlegm. He is using Ryaltris  nasal spray, which he finds effective, though it is not covered by insurance, costing him $47 out of pocket. He takes Mucinex  (plain) twice daily to help thin mucus and continues breathing treatments with a flutter valve. He has a nebulizer and is awaiting new supplies.  He has been started on Dupixent . He previously received samples from Dr. Tiana and is currently waiting for insurance approval. Aspergillus panel is pending.   He had a a CT chest that showed progressive bronchitic changes compared to prior. No acute superimposed infection evident.  He experiences generalized weakness, which he attributes to his lung and heart issues. His activity level has decreased over the past six months due to symptoms, leading to muscle weakness.   He did have a new compression fracture in his spine on CT imaging; does have back pain. He does have an orthopedic doctor who he plans to call. No saddle anesthesia, numbness/tingling, worsening LE weakness, gait abnormalities. He has been on chronic prednisone  recently.       Allergies  Allergen Reactions   Dust Mite Extract Other (See Comments)    Immunization History  Administered Date(s)  Administered   Fluad Quad(high Dose 65+) 09/09/2019   Influenza Split 09/09/2013   Influenza, High Dose Seasonal PF 09/22/2015, 09/27/2016, 09/27/2016, 09/11/2017   Influenza,inj,Quad PF,6+ Mos 09/03/2018   Influenza-Unspecified 09/18/2014   PFIZER(Purple Top)SARS-COV-2 Vaccination 02/05/2020, 02/26/2020   Pneumococcal Conjugate-13 02/17/2014   Pneumococcal Polysaccharide-23 10/13/2009, 04/16/2015   Tdap 03/13/2007, 03/10/2024   Zoster, Live 05/29/2015    Past Medical History:  Diagnosis Date   Arthritis    Asthma    Atrial fibrillation (HCC)    Biliary colic 2014   Bronchiectasis (HCC)    2021   Cataract    COPD (chronic obstructive pulmonary disease) with chronic bronchitis (HCC)    Coronary artery calcification seen on CAT scan    Cyst of pancreas    1 cm (7/18), repeat ct in 12 months.     Hyperlipidemia    Hypertension    Obstructive sleep apnea    Renal disorder    Rhinitis    Sleep apnea    pt denies sleep apnea-lost weight    Tobacco History: Social History   Tobacco Use  Smoking Status Former   Current packs/day: 0.00   Average packs/day: 2.0 packs/day for 11.0 years (22.0 ttl pk-yrs)   Types: Cigarettes   Start date: 12/20/1963   Quit date: 12/19/1974   Years since quitting: 49.5  Smokeless Tobacco Never   Counseling given: Not Answered   Outpatient Medications Prior to Visit  Medication Sig Dispense Refill   acetaminophen  (TYLENOL ) 500 MG tablet Take 1,000 mg by mouth every 6 (six) hours as needed for mild pain (pain score 1-3).     apixaban  (ELIQUIS ) 5 MG TABS tablet TAKE 1 TABLET BY MOUTH TWICE A DAY 60 tablet 5   Ascorbic Acid (VITAMIN C) 1000 MG tablet Take 1,000 mg by mouth 2 (two) times daily.     benzonatate  (TESSALON ) 200 MG capsule Take 1 capsule (200 mg total) by mouth 3 (three) times daily as needed for cough. 30 capsule 1   budesonide -formoterol  (SYMBICORT ) 160-4.5 MCG/ACT inhaler Inhale 2 puffs into the lungs in the morning and at bedtime.  Generic medication 1 each 6   Cholecalciferol (VITAMIN D3) 125 MCG (5000 UT) TABS Take 1 tablet by mouth 2 (two) times daily.     Coenzyme Q10 (CO Q-10) 100 MG CAPS Take 1 capsule by mouth daily.     diltiazem  (DILACOR XR ) 240 MG 24 hr capsule Take 1 capsule (240 mg total) by mouth daily. 90 capsule 1   Dupilumab  (DUPIXENT ) 100 MG/0.67ML SOSY Inject into the skin.     FIBER COMPLETE PO Take 1 tablet by mouth daily.     furosemide  (LASIX ) 40 MG tablet Take 1 tablet (40 mg total) by mouth daily. Take additional 20 mg if your weight is 3 lbs in 1 day of 5 lbs in 1 week 30 tablet 3   guaiFENesin  (MUCINEX ) 600 MG  12 hr tablet Take 1 tablet (600 mg total) by mouth 2 (two) times daily. 60 tablet 0   levalbuterol  (XOPENEX  HFA) 45 MCG/ACT inhaler Inhale 2 puffs into the lungs every 6 (six) hours as needed for wheezing. 1 each 12   levalbuterol  (XOPENEX ) 0.63 MG/3ML nebulizer solution Take 3 mLs (0.63 mg total) by nebulization 4 (four) times daily for 7 days, THEN 3 mLs (0.63 mg total) every 6 (six) hours as needed for wheezing or shortness of breath.     metoprolol  succinate (TOPROL -XL) 100 MG 24 hr tablet Take 1 tablet (100 mg total) by mouth daily. Take with or immediately following a meal. 90 tablet 3   montelukast  (SINGULAIR ) 10 MG tablet TAKE 1 TABLET BY MOUTH EVERY DAY 90 tablet 2   Olopatadine-Mometasone  (RYALTRIS ) 665-25 MCG/ACT SUSP 2 sprays each nostril 2 times per day 29 g 3   pantoprazole  (PROTONIX ) 40 MG tablet TAKE 1 TABLET BY MOUTH TWICE A DAY 180 tablet 2   predniSONE  (DELTASONE ) 10 MG tablet Take 1 tablet (10 mg total) by mouth daily with breakfast. 30 tablet 3   Respiratory Therapy Supplies (FLUTTER) DEVI Use as directed 1 each 0   rosuvastatin  (CRESTOR ) 20 MG tablet TAKE 1 TABLET BY MOUTH EVERY DAY **D/C LIPITOR** (Patient taking differently: Take 20 mg by mouth every evening.) 90 tablet 2   amoxicillin -clavulanate (AUGMENTIN ) 875-125 MG tablet Take 1 tablet by mouth 2 (two) times daily.  20 tablet 0   azithromycin  (ZITHROMAX ) 250 MG tablet 2 tabs poqday1, 1 tab poqday 2-5 6 tablet 0   No facility-administered medications prior to visit.     Review of Systems:   Constitutional: No weight loss or gain, night sweats, fevers, chills, lassitude. +fatigue  HEENT: No headaches, difficulty swallowing, tooth/dental problems, or sore throat. No sneezing, itching, ear ache +chronic nasal congestion CV: +baseline swelling in lower extremities (improved). No chest pain, orthopnea, PND, anasarca, dizziness, palpitations, syncope Resp: + shortness of breath with exertion; cough non productive. No wheezing. No excess mucus or change in color of mucus. No hemoptysis.No chest wall deformity GI:  No heartburn, indigestion, abdominal pain, nausea, vomiting, diarrhea, change in bowel habits, loss of appetite, bloody stools.  GU: No dysuria, change in color of urine, urgency or frequency.  No flank pain, no hematuria  Skin: No rash, lesions, ulcerations MSK:  No joint pain or swelling.   Neuro: No dizziness or lightheadedness.  Psych: No depression or anxiety. Mood stable.     Physical Exam:  BP 108/84 (BP Location: Left Arm, Patient Position: Sitting, Cuff Size: Normal)   Pulse 90   Ht 5' 8 (1.727 m)   Wt 211 lb 12.8 oz (96.1 kg)   SpO2 97%   BMI 32.20 kg/m   GEN: Pleasant, interactive, well-kempt; obese; non-toxic and in no acute distress HEENT:  Normocephalic and atraumatic. PERRLA. Sclera white. Nasal turbinates pale, moist and patent bilaterally. No rhinorrhea present. Oropharynx pink and moist, without exudate or edema. No lesions, ulcerations, or postnasal drip.  NECK:  Supple w/ fair ROM. No JVD present. Normal carotid impulses w/o bruits. Thyroid  symmetrical with no goiter or nodules palpated. No lymphadenopathy.   CV: Irregular rhythm, rate controlled, no m/r/g, no peripheral edema. Pulses intact, +2 bilaterally. No cyanosis, pallor or clubbing. PULMONARY:  Unlabored,  regular breathing. Diminished bibasilar airflow otherwise clear b/l A&P w/o wheezes/rales/rhonchi. No accessory muscle use.  GI: BS present and normoactive. Soft, non-tender to palpation. No organomegaly or masses detected.  MSK: No erythema,  warmth or tenderness. Cap refil <2 sec all extrem. No deformities or joint swelling noted.  Neuro: A/Ox3. No focal deficits noted.   Skin: Warm, no lesions or rashe Psych: Normal affect and behavior. Judgement and thought content appropriate.     Lab Results:  CBC    Component Value Date/Time   WBC 10.4 05/28/2024 1157   WBC 14.7 (H) 05/07/2024 1652   RBC 4.55 05/28/2024 1157   RBC 4.83 05/07/2024 1652   HGB 13.1 05/28/2024 1157   HCT 41.0 05/28/2024 1157   PLT 264 05/28/2024 1157   MCV 90 05/28/2024 1157   MCH 28.8 05/28/2024 1157   MCH 28.6 05/07/2024 1652   MCHC 32.0 05/28/2024 1157   MCHC 32.1 05/07/2024 1652   RDW 17.0 (H) 05/28/2024 1157   LYMPHSABS 0.6 (L) 05/28/2024 1157   MONOABS 1.3 (H) 09/16/2019 0341   EOSABS 0.0 05/28/2024 1157   BASOSABS 0.0 05/28/2024 1157    BMET    Component Value Date/Time   NA 140 05/07/2024 1652   NA 141 03/12/2024 1239   K 4.9 05/07/2024 1652   CL 104 05/07/2024 1652   CO2 29 05/07/2024 1652   GLUCOSE 106 (H) 05/07/2024 1652   BUN 43 (H) 05/07/2024 1652   BUN 29 (H) 03/12/2024 1239   CREATININE 1.16 05/07/2024 1652   CALCIUM  9.3 05/07/2024 1652   GFRNONAA >60 04/18/2024 0438   GFRNONAA 72 03/26/2021 0855   GFRAA 83 03/26/2021 0855    BNP    Component Value Date/Time   BNP 132 (H) 05/07/2024 1652     Imaging:  CT Chest Wo Contrast Result Date: 06/03/2024 CLINICAL DATA:  Chronic cough.  Known chronic bronchitis. EXAM: CT CHEST WITHOUT CONTRAST TECHNIQUE: Multidetector CT imaging of the chest was performed following the standard protocol without IV contrast. RADIATION DOSE REDUCTION: This exam was performed according to the departmental dose-optimization program which includes  automated exposure control, adjustment of the mA and/or kV according to patient size and/or use of iterative reconstruction technique. COMPARISON:  Prior CT scan of the chest 12/05/2023 FINDINGS: Cardiovascular: Limited evaluation in the absence of intravenous contrast. Conventional 3 vessel arch anatomy. No evidence of aneurysm. Scattered calcifications along the aorta and coronary arteries. The heart is within normal limits in size. Trace pericardial fluid versus thickening, stable compared to prior and likely chronic. Mediastinum/Nodes: Unremarkable thyroid  gland. No mediastinal mass or adenopathy. Moderately large hiatal hernia. Lungs/Pleura: Slight interval progression of bilateral lower lobe bronchial wall thickening and developing bronchiectasis with associated volume loss and linear pleuroparenchymal scarring. Similar findings are also beginning to develop in the inferior aspect of the right middle lobe and lingula although less pronounced. A few scattered calcified pulmonary nodules are present and are almost certainly benign. At less than 5 mm, no further imaging follow-up is recommended. No pleural effusion or pneumothorax. No focal airspace infiltrate to suggest pneumonia. Upper Abdomen: No acute abnormality within the visualized upper abdomen. Musculoskeletal: Interval development of a T12 compression fracture compared to the prior imaging. Gas is present within the fracture cleft. Height loss is approximately 55%. Mild posterior retropulsion of 2 mm. IMPRESSION: 1. New and likely acute/subacute T12 compression fracture with approximately 55% height loss and mild (2 mm) posterior retropulsion when compared with prior imaging from 12/05/2023. 2. Slight interval progression of diffuse bronchial wall thickening and developing bronchiectasis with associated volume loss/atelectasis and pleuroparenchymal scarring in the bilateral lower lobes with similar but less conspicuous findings also developing within  the inferior aspect of  the right middle lobe and lingula. Findings suggest progressive chronic bronchitis. 3. No evidence of active pneumonia or new pulmonary mass or nodule. 4. Aortic and coronary artery atherosclerotic vascular calcifications. Aortic Atherosclerosis (ICD10-I70.0). Electronically Signed   By: Wilkie Lent M.D.   On: 06/03/2024 07:11   DG Chest 2 View Result Date: 05/24/2024 CLINICAL DATA:  possible pneumonia , history of COPD EXAM: CHEST - 2 VIEW COMPARISON:  April 15, 2024 FINDINGS: Flattening of the diaphragms which can be seen in emphysema. Streaky scarring or atelectasis in the left lung base again noted. No pleural effusion or pneumothorax. No cardiomegaly. Tortuous aorta with aortic atherosclerosis. No acute fracture or destructive lesion. Focal thoracic kyphosis with severe chronic compression fracture in the lower thoracic spine. Moderate-sized hiatal hernia. IMPRESSION: No acute cardiopulmonary abnormality. Electronically Signed   By: Rogelia Myers M.D.   On: 05/24/2024 17:43   EP STUDY Result Date: 05/14/2024 See surgical note for result.   dupilumab  (DUPIXENT ) prefilled syringe 300 mg     Date Action Dose Route User   05/28/2024 1203 Given 300 mg Subcutaneous (Other) Marcine Isaiah CROME, CMA      methylPREDNISolone  acetate (DEPO-MEDROL ) injection 80 mg     Date Action Dose Route User   Discharged on 05/14/2024   Admitted on 05/14/2024   Discharged on 04/18/2024   Admitted on 04/15/2024   04/12/2024 1048 Given 80 mg Intramuscular (Left Deltoid) Karlynn Mile, CMA      methylPREDNISolone  acetate (DEPO-MEDROL ) injection 60 mg     Date Action Dose Route User   Discharged on 05/14/2024   Admitted on 05/14/2024   04/30/2024 1638 Given 60 mg Intramuscular (Left Ventrogluteal) Angelena Ronal Slater MARLA, LPN          Latest Ref Rng & Units 08/13/2018   10:52 AM  PFT Results  FVC-Pre L 2.99   FVC-Predicted Pre % 73   Pre FEV1/FVC % % 62   FEV1-Pre L 1.85    FEV1-Predicted Pre % 63   DLCO uncorrected ml/min/mmHg 23.00   DLCO UNC% % 75   DLCO corrected ml/min/mmHg 22.69   DLCO COR %Predicted % 74   DLVA Predicted % 108   TLC L 5.95   TLC % Predicted % 88   RV % Predicted % 120     Lab Results  Component Value Date   NITRICOXIDE 26 05/10/2024        Assessment & Plan:   Very poorly controlled severe persistent asthma Severe asthma with allergic phenotype. Frequent exacerbations over the last 6 months. Initiated on biologic therapy with Dupixent  by Dr. Kozlow approx. 2 weeks ago. He remains on chronic low dose daily prednisone . No perceived benefit previously with addition of LAMA. Continue on ICS/LABA with high dose Symbicort . Continue mucociliary clearance therapies. Action plan in place. Rule out ABPA; aspergillus panel pending. He is on appropriate treatment for management of this. If symptoms persist and aspergillus positive, could consider addition of antifungal. He may also need bronchoscopy, especially given hx of btx and inability to rule out chronic indolent infections. AFB is pending. Prior sputum culture negative.  Repeat CT chest in 3 months.  Patient Instructions  Continue Symbicort  2 puffs Twice daily. Brush tongue and rinse mouth afterwards Continue Albuterol  inhaler 2 puffs or levalbuterol  3 mL neb every 6 hours as needed for shortness of breath or wheezing. Notify if symptoms persist despite rescue inhaler/neb use. Use nebs 2-3 times a day then follow with flutter valve 10 times  Continue montelukast   1 tab daily Continue pantoprazole  Twice daily for reflux Continue mucinex  DM Twice daily for cough/congestion Continue daily non drowsy allergy  pill such as claritin, zyrtec or xyzal.    Continue dupixent  injections    Continue prednisone  10 mg daily. Take in AM with food   Follow up with Dr. Maurilio  Call your orthopedic doctor about the new spine fracture  Usually these are treated conservatively. May need a brace  for it. Would recommend after you're cleared, to talk to Dr. Duanne about physical therapy   Repeat CT chest in 3 months unless symptoms worsen    Follow up as scheduled with Dr. Kara. If symptoms do not improve or worsen, please contact office for sooner follow up or seek emergency care.    Chronic maxillary sinusitis Continue sinus rinses/sprays and allergy  regimen   Bronchiectasis without complication (HCC) See above plan  Physical deconditioning Advised to discuss PT with Dr. Duanne once T12 compression fx has been addressed.   Thoracic compression fracture (HCC) In setting of chronic steroid use. Goal is to taper him off once he has stabilized. Advised to reach out to his orthopedist. Will likely be managed with conservative therapy. ED precautions reviewed.   Paroxysmal atrial fibrillation (HCC) On chronic AC. Rate controlled. Follow up with cardiology as scheduled.      Advised if symptoms do not improve or worsen, to please contact office for sooner follow up or seek emergency care.   I spent 35 minutes of dedicated to the care of this patient on the date of this encounter to include pre-visit review of records, face-to-face time with the patient discussing conditions above, post visit ordering of testing, clinical documentation with the electronic health record, making appropriate referrals as documented, and communicating necessary findings to members of the patients care team.  Comer LULLA Rouleau, NP 06/07/2024  Pt aware and understands NP's role.

## 2024-06-04 NOTE — Patient Instructions (Signed)
 Continue Symbicort  2 puffs Twice daily. Brush tongue and rinse mouth afterwards Continue Albuterol  inhaler 2 puffs or levalbuterol  3 mL neb every 6 hours as needed for shortness of breath or wheezing. Notify if symptoms persist despite rescue inhaler/neb use. Use nebs 2-3 times a day then follow with flutter valve 10 times  Continue montelukast  1 tab daily Continue pantoprazole  Twice daily for reflux Continue mucinex  DM Twice daily for cough/congestion Continue daily non drowsy allergy  pill such as claritin, zyrtec or xyzal.    Continue dupixent  injections    Continue prednisone  10 mg daily. Take in AM with food   Follow up with Dr. Jerelene Monday  Call your orthopedic doctor about the new spine fracture  Usually these are treated conservatively. May need a brace for it. Would recommend after you're cleared, to talk to Dr. Cheril Cork about physical therapy   Repeat CT chest in 3 months unless symptoms worsen    Follow up as scheduled with Dr. Diania Fortes. If symptoms do not improve or worsen, please contact office for sooner follow up or seek emergency care.

## 2024-06-05 ENCOUNTER — Encounter: Payer: Self-pay | Admitting: Family Medicine

## 2024-06-06 ENCOUNTER — Telehealth: Payer: Self-pay | Admitting: *Deleted

## 2024-06-06 NOTE — Telephone Encounter (Signed)
-----   Message from [name] sent at 05/30/2024 12:16 PM EDT ----- Needs Dupi for severe asthma on daily steroids. Please let him know.

## 2024-06-06 NOTE — Telephone Encounter (Signed)
 Called patient and discussed approval and submit to Dupixent  [employer] patient assistance due to income. Will mail app to patient

## 2024-06-07 ENCOUNTER — Encounter: Payer: Self-pay | Admitting: Nurse Practitioner

## 2024-06-07 ENCOUNTER — Encounter (HOSPITAL_COMMUNITY): Payer: Self-pay

## 2024-06-07 DIAGNOSIS — R5381 Other malaise: Secondary | ICD-10-CM | POA: Insufficient documentation

## 2024-06-07 DIAGNOSIS — S22000A Wedge compression fracture of unspecified thoracic vertebra, initial encounter for closed fracture: Secondary | ICD-10-CM | POA: Insufficient documentation

## 2024-06-07 LAB — ASPERGILLUS PRECIPITINS
A.Fumigatus #1 Abs: NEGATIVE
Aspergillus Flavus Antibodies: NEGATIVE
Aspergillus Niger Antibodies: NEGATIVE
Aspergillus glaucus IgG: NEGATIVE
Aspergillus nidulans IgG: NEGATIVE
Aspergillus terreus IgG: NEGATIVE

## 2024-06-07 LAB — SPECIMEN STATUS REPORT

## 2024-06-07 NOTE — Assessment & Plan Note (Signed)
 See above plan.

## 2024-06-07 NOTE — Assessment & Plan Note (Addendum)
 In setting of chronic steroid use. Goal is to taper him off once he has stabilized. Advised to reach out to his orthopedist. Will likely be managed with conservative therapy. ED precautions reviewed.

## 2024-06-07 NOTE — Assessment & Plan Note (Signed)
 Advised to discuss PT with Dr. Cheril Cork once T12 compression fx has been addressed.

## 2024-06-07 NOTE — Assessment & Plan Note (Signed)
 Continue sinus rinses/sprays and allergy  regimen

## 2024-06-07 NOTE — Assessment & Plan Note (Signed)
 On chronic AC. Rate controlled. Follow up with cardiology as scheduled

## 2024-06-07 NOTE — Assessment & Plan Note (Signed)
 Severe asthma with allergic phenotype. Frequent exacerbations over the last 6 months. Initiated on biologic therapy with Dupixent  by Dr. Kozlow approx. 2 weeks ago. He remains on chronic low dose daily prednisone . No perceived benefit previously with addition of LAMA. Continue on ICS/LABA with high dose Symbicort . Continue mucociliary clearance therapies. Action plan in place. Rule out ABPA; aspergillus panel pending. He is on appropriate treatment for management of this. If symptoms persist and aspergillus positive, could consider addition of antifungal. He may also need bronchoscopy, especially given hx of btx and inability to rule out chronic indolent infections. AFB is pending. Prior sputum culture negative.  Repeat CT chest in 3 months.  Patient Instructions  Continue Symbicort  2 puffs Twice daily. Brush tongue and rinse mouth afterwards Continue Albuterol  inhaler 2 puffs or levalbuterol  3 mL neb every 6 hours as needed for shortness of breath or wheezing. Notify if symptoms persist despite rescue inhaler/neb use. Use nebs 2-3 times a day then follow with flutter valve 10 times  Continue montelukast  1 tab daily Continue pantoprazole  Twice daily for reflux Continue mucinex  DM Twice daily for cough/congestion Continue daily non drowsy allergy  pill such as claritin, zyrtec or xyzal.    Continue dupixent  injections    Continue prednisone  10 mg daily. Take in AM with food   Follow up with Dr. Jerelene Monday  Call your orthopedic doctor about the new spine fracture  Usually these are treated conservatively. May need a brace for it. Would recommend after you're cleared, to talk to Dr. Cheril Cork about physical therapy   Repeat CT chest in 3 months unless symptoms worsen    Follow up as scheduled with Dr. Diania Fortes. If symptoms do not improve or worsen, please contact office for sooner follow up or seek emergency care.

## 2024-06-10 ENCOUNTER — Ambulatory Visit (INDEPENDENT_AMBULATORY_CARE_PROVIDER_SITE_OTHER): Admitting: Family Medicine

## 2024-06-10 ENCOUNTER — Encounter: Payer: Self-pay | Admitting: Family Medicine

## 2024-06-10 VITALS — BP 132/80 | HR 93 | Temp 97.7°F | Ht 68.0 in | Wt 213.2 lb

## 2024-06-10 DIAGNOSIS — J4540 Moderate persistent asthma, uncomplicated: Secondary | ICD-10-CM | POA: Diagnosis not present

## 2024-06-10 DIAGNOSIS — R768 Other specified abnormal immunological findings in serum: Secondary | ICD-10-CM

## 2024-06-10 DIAGNOSIS — M8008XA Age-related osteoporosis with current pathological fracture, vertebra(e), initial encounter for fracture: Secondary | ICD-10-CM | POA: Diagnosis not present

## 2024-06-10 NOTE — Progress Notes (Signed)
 Subjective:    Patient ID: Mario [name], male    DOB: Aug 07, 1946, 78 y.o.   MRN: [id number] 04/26/24 Patient was recently in hospital with atrial fibrillation with rapid ventricular response.  This was complicated by acute heart failure.  He was started on Lasix  40 mg a day.  Diltiazem  240 mg a day was added to Toprol  200 mg a day.  He is here today for follow-up.  He reports feeling extremely weak and tired.  However his heart rate today is in the 70s.  He is still in atrial fibrillation but the rate is well-controlled.  There is no evidence of hypotension or bradycardia.  He denies any syncope or near syncope.  However both lungs have bibasilar crackles concerning for pulmonary edema.  He also has trace pitting edema in both feet suggesting that he is still fluid overloaded.  At that time, my plan was: Patient's heart rate is well-controlled with a combination of diltiazem  and metoprolol .  However he appears to still be dealing with pulmonary edema.  I recommended increasing Lasix  to 60 mg a day with a plan to recheck the first of next week.  I believe he needs to diurese 2-3 more pounds.  His lungs are clear next week we will reduce the dose of Lasix  back to 40 mg and try to maintain a dry weight of around 207 to 208 pounds.  Monitor for hypokalemia or prerenal azotemia.  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  He reduced his Lasix  back to 40 mg this morning.  However he is audibly congested.  He is wheezing.  He has diffuse expiratory wheezing in all 4 lung fields.  He has prominent rhonchi in both lungs.  He has right basilar crackles.  Patient independently took 10 mg of prednisone  at home this morning.  He  reports increasing shortness of breath and feeling plugged up.  At that time, my plan was: I believe the patient is euvolemic.  Continue Lasix  40 mg a day.  Although in atrial fibrillation, his heart rate is controlled in the 80s.  The patient has just recently finished prednisone .  He is now developing yet another exacerbation of his asthma.  We have been unable to since this would keep him off prednisone  and antibiotics due to recurrent exacerbations.  I truly feel the patient would benefit from a biologic therapy such as Xolair or Dupixent .  He has seen pulmonology at the end April.  They also had start steroids due to an exacerbation.  They planned on checking a CBC and IgE at his next visit to determine if he would qualify for inhaled biologic therapy.  I will check this today and hopefully facilitate getting the patient started on this medication.  Ultimately I feel that we have to find a better solution to manage his obstructive lung disease as he continues to have frequent steroid-dependent exacerbations.  Patient was given 60 mg IM of Depo-Medrol  x 1 now and will start a prolonged prednisone  taper beginning tomorrow.  He develops purulent sputum or if he develops a high fever, I would want him to start Levaquin  500 milligrams daily for 7 days  05/07/24 Patient's weight remains  stable.  There is minimal or trace edema in his legs.  There is no sign of fluid overload.  His wheezing has improved dramatically from last week.  I no longer appreciate any wheezing however he continues to have rhonchorous breath sounds in all 4 lung fields and bibasilar Rales.  He reports a cough productive of green mucus despite taking Levaquin .  He reports shortness of breath.  He is concerned because he is scheduled to have cardioversion on Tuesday of next week and he wants to make sure the infection has totally resolved.  He did take the Levaquin  that I prescribed to him last week and he states that he is feeling better  however his lungs today still sound extremely congested.  He continues to have purulent sputum.  06/10/24 Patient was recently started on Dupixent  after referral to his asthma and allergy  specialist.  I explained to the patient it is too soon to determine any benefit.  I believe we will take at least 2 to 3 months to tell if the medication is beneficial but hopefully we will see a decrease in the frequency of his exacerbations.  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  Of note, the patient recently had a CT scan of the chest that had a coincidental finding of a vertebral fracture of T12.  He is currently not on any bisphosphonate or therapy for osteoporosis.  He has no recollection of how the injury occurred.  I suspect that it is due to the longstanding glucocorticoid use due to his pulmonary disease.  Because of pain in his back as well as severe shortness of breath with activity, the patient is requesting a wheelchair.  He would like a portable foldable wheelchair that he can carry with him to church or to shop.  If he has to walk more than 50 yards, he becomes profoundly short of breath and has to sit down.  This is only exacerbated by the pain in his back.  I feel that a portable wheelchair is entirely appropriate given the severity of his uncontrolled asthma coupled with his back pain secondary to his vertebral fracture. Past Medical History:  Diagnosis Date   Arrhythmia    Arthritis    Asthma    Atrial fibrillation (HCC)    Biliary colic 2014   Bronchiectasis (HCC)    2021   Cataract    COPD (chronic obstructive pulmonary disease) with chronic bronchitis (HCC)    Coronary artery calcification seen on CAT scan    Cyst of pancreas    1 cm (7/18), repeat ct in 12 months.     Hyperlipidemia    Hypertension    Obstructive sleep apnea    Renal disorder    Rhinitis    Sleep apnea    pt denies sleep apnea-lost weight   Past Surgical History:  Procedure Laterality Date    CARDIOVERSION N/A 05/14/2024   Procedure: CARDIOVERSION;  Surgeon: Michele Richardson, DO;  Location: [facility];  Service: Cardiovascular;  Laterality: N/A;   CHOLECYSTECTOMY     COLONOSCOPY  09/29/2014   INCISION AND DRAINAGE OF WOUND Left 03/13/2024   Procedure: IRRIGATION AND DEBRIDEMENT WOUND;  Surgeon: Shari Easter, MD;  Location: [facility] OR;  Service: Orthopedics;  Laterality: Left;  IRRIGATION AND DEBRIDEMENT FIFTH FINGER LEFT   LAPAROSCOPIC CHOLECYSTECTOMY SINGLE SITE WITH INTRAOPERATIVE CHOLANGIOGRAM N/A 09/15/2019   Procedure: LAPAROSCOPIC CHOLECYSTECTOMY SINGLE SITE;  Surgeon: Sheldon Standing, MD;  Location: [facility];  Service: General;  Laterality:  N/A;   LITHOTRIPSY  2000   for kidney stone   Current Outpatient Medications on File Prior to Visit  Medication Sig Dispense Refill   acetaminophen  (TYLENOL ) 500 MG tablet Take 1,000 mg by mouth every 6 (six) hours as needed for mild pain (pain score 1-3).     apixaban  (ELIQUIS ) 5 MG TABS tablet TAKE 1 TABLET BY MOUTH TWICE A DAY 60 tablet 5   Ascorbic Acid (VITAMIN C) 1000 MG tablet Take 1,000 mg by mouth 2 (two) times daily.     benzonatate  (TESSALON ) 200 MG capsule Take 1 capsule (200 mg total) by mouth 3 (three) times daily as needed for cough. 30 capsule 1   budesonide -formoterol  (SYMBICORT ) 160-4.5 MCG/ACT inhaler Inhale 2 puffs into the lungs in the morning and at bedtime. Generic medication 1 each 6   Cholecalciferol (VITAMIN D3) 125 MCG (5000 UT) TABS Take 1 tablet by mouth 2 (two) times daily.     Coenzyme Q10 (CO Q-10) 100 MG CAPS Take 1 capsule by mouth daily.     diltiazem  (DILACOR XR ) 240 MG 24 hr capsule Take 1 capsule (240 mg total) by mouth daily. 90 capsule 1   Dupilumab  (DUPIXENT ) 100 MG/0.67ML SOSY Inject into the skin.     FIBER COMPLETE PO Take 1 tablet by mouth daily.     furosemide  (LASIX ) 40 MG tablet Take 1 tablet (40 mg total) by mouth daily. Take additional 20 mg if your weight is 3 lbs in 1 day of 5 lbs in 1  week 30 tablet 3   guaiFENesin  (MUCINEX ) 600 MG 12 hr tablet Take 1 tablet (600 mg total) by mouth 2 (two) times daily. 60 tablet 0   levalbuterol  (XOPENEX  HFA) 45 MCG/ACT inhaler Inhale 2 puffs into the lungs every 6 (six) hours as needed for wheezing. 1 each 12   levalbuterol  (XOPENEX ) 0.63 MG/3ML nebulizer solution Take 3 mLs (0.63 mg total) by nebulization 4 (four) times daily for 7 days, THEN 3 mLs (0.63 mg total) every 6 (six) hours as needed for wheezing or shortness of breath.     metoprolol  succinate (TOPROL -XL) 100 MG 24 hr tablet Take 1 tablet (100 mg total) by mouth daily. Take with or immediately following a meal. 90 tablet 3   montelukast  (SINGULAIR ) 10 MG tablet TAKE 1 TABLET BY MOUTH EVERY DAY 90 tablet 2   Olopatadine-Mometasone  (RYALTRIS ) 665-25 MCG/ACT SUSP 2 sprays each nostril 2 times per day 29 g 3   pantoprazole  (PROTONIX ) 40 MG tablet TAKE 1 TABLET BY MOUTH TWICE A DAY 180 tablet 2   predniSONE  (DELTASONE ) 10 MG tablet Take 1 tablet (10 mg total) by mouth daily with breakfast. 30 tablet 3   Respiratory Therapy Supplies (FLUTTER) DEVI Use as directed 1 each 0   rosuvastatin  (CRESTOR ) 20 MG tablet TAKE 1 TABLET BY MOUTH EVERY DAY **D/C LIPITOR** (Patient taking differently: Take 20 mg by mouth every evening.) 90 tablet 2   No current facility-administered medications on file prior to visit.   Allergies  Allergen Reactions   Dust Mite Extract Other (See Comments)   Social History   Socioeconomic History   Marital status: Married    Spouse name: Not on file   Number of children: Not on file   Years of education: Not on file   Highest education level: Bachelor's degree (e.g., BA, AB, BS)  Occupational History   Not on file  Tobacco Use   Smoking status: Former    Current packs/day: 0.00  Average packs/day: 2.0 packs/day for 11.0 years (22.0 ttl pk-yrs)    Types: Cigarettes    Start date: 12/20/1963    Quit date: 12/19/1974    Years since quitting: 49.5   Smokeless  tobacco: Never  Vaping Use   Vaping status: Never Used  Substance and Sexual Activity   Alcohol use: Not Currently    Alcohol/week: 1.0 standard drink of alcohol   Drug use: No   Sexual activity: Not on file  Other Topics Concern   Not on file  Social History Narrative   Not on file   Social Drivers of Health   Financial Resource Strain: High Risk (06/08/2024)   Overall Financial Resource Strain (CARDIA)    Difficulty of Paying Living Expenses: Hard  Food Insecurity: No Food Insecurity (06/08/2024)   Hunger Vital Sign    Worried About Running Out of Food in the Last Year: Never true    Ran Out of Food in the Last Year: Never true  Transportation Needs: No Transportation Needs (06/08/2024)   PRAPARE - Administrator, Civil Service (Medical): No    Lack of Transportation (Non-Medical): No  Physical Activity: Inactive (06/08/2024)   Exercise Vital Sign    Days of Exercise per Week: 0 days    Minutes of Exercise per Session: Not on file  Stress: No Stress Concern Present (06/08/2024)   [employer] of Occupational Health - Occupational Stress Questionnaire    Feeling of Stress: Not at all  Social Connections: Socially Integrated (06/08/2024)   Social Connection and Isolation Panel    Frequency of Communication with Friends and Family: More than three times a week    Frequency of Social Gatherings with Friends and Family: More than three times a week    Attends Religious Services: More than 4 times per year    Active Member of [employer] or Organizations: Yes    Attends Banker Meetings: More than 4 times per year    Marital Status: Married  Catering manager Violence: Not At Risk (05/08/2024)   Humiliation, Afraid, Rape, and Kick questionnaire    Fear of Current or Ex-Partner: No    Emotionally Abused: No    Physically Abused: No    Sexually Abused: No      Review of Systems  Respiratory:  Positive for cough.   All other systems reviewed and are  negative.      Objective:   Physical Exam Vitals reviewed.  Constitutional:      Appearance: He is well-developed.  HENT:     Head: Normocephalic and atraumatic.     Nose: Nose normal.     Mouth/Throat:     Pharynx: No oropharyngeal exudate.  Neck:     Vascular: No carotid bruit.   Cardiovascular:     Rate and Rhythm: Normal rate. Rhythm irregular.     Heart sounds: Normal heart sounds.  Pulmonary:     Effort: Pulmonary effort is normal. No tachypnea or respiratory distress.     Breath sounds: No stridor or decreased air movement. Examination of the right-middle field reveals rhonchi. Examination of the left-middle field reveals rhonchi. Examination of the right-lower field reveals rhonchi and rales. Examination of the left-lower field reveals rhonchi and rales. Rhonchi and rales present. No wheezing.   Musculoskeletal:     Right lower leg: Edema present.     Left lower leg: Edema present.           Assessment & Plan:  Asthma, moderate persistent,  poorly-controlled  Elevated IgE level  Fracture of vertebra due to osteoporosis, initial encounter (HCC) I attribute his vertebral fracture to osteoporosis due to his longstanding steroid use.  I have recommended starting Fosamax versus Prolia.  Patient would like to discuss this with his orthopedist first.  We discussed symptomatic management with narcotics versus a referral for kyphoplasty to interventional radiology.  Patient would like to discuss this with his orthopedist first as well.  I will defer to pulmonology and asthma/allergy  regarding management of his asthma.  I am grateful that he has been able to start Dupixent  and I hope that this would be beneficial in reducing the number of exacerbations.  I will be glad to give the patient a prescription for a portable/collapsible wheelchair.  He certainly would benefit from this given the severe back pain as well as the shortness of breath due to underlying lung disease that  limits his ability to ambulate

## 2024-06-11 ENCOUNTER — Ambulatory Visit (HOSPITAL_COMMUNITY)
Admission: RE | Admit: 2024-06-11 | Discharge: 2024-06-11 | Disposition: A | Discharge status: Home / Self Care | Source: Ambulatory Visit | Attending: Emergency Medicine | Admitting: Emergency Medicine

## 2024-06-11 DIAGNOSIS — I10 Essential (primary) hypertension: Secondary | ICD-10-CM | POA: Diagnosis not present

## 2024-06-11 DIAGNOSIS — I2510 Atherosclerotic heart disease of native coronary artery without angina pectoris: Secondary | ICD-10-CM | POA: Diagnosis not present

## 2024-06-11 LAB — NM PET CT CARDIAC PERFUSION MULTI W/ABSOLUTE BLOODFLOW
LV dias vol: 2.2 mL (ref 62–150)
MBFR: 2.01
Nuc Rest EF: 55 %
Nuc Stress EF: 57 %
Peak HR: 106 {beats}/min
Rest HR: 85 {beats}/min
Rest MBF: 0.89 ml/g/min
Rest Nuclear Isotope Dose: 25 mCi
ST Depression (mm): 0 mm
Stress MBF: 1.79 ml/g/min
Stress Nuclear Isotope Dose: 25 mCi

## 2024-06-11 MED ORDER — RUBIDIUM RB82 GENERATOR (RUBYFILL)
25.5600 | PACK | Freq: Once | INTRAVENOUS | Status: AC
Start: 2024-06-11 — End: 2024-06-11
  Administered 2024-06-11: 25.03 via INTRAVENOUS

## 2024-06-11 MED ORDER — REGADENOSON 0.4 MG/5ML IV SOLN
INTRAVENOUS | Status: AC
  Filled 2024-06-11: qty 5

## 2024-06-11 MED ORDER — RUBIDIUM RB82 GENERATOR (RUBYFILL)
25.5600 | PACK | Freq: Once | INTRAVENOUS | Status: AC
  Administered 2024-06-11: 25.05 via INTRAVENOUS

## 2024-06-11 MED ORDER — REGADENOSON 0.4 MG/5ML IV SOLN
0.4000 mg | Freq: Once | INTRAVENOUS | Status: AC
  Administered 2024-06-11: 0.4 mg via INTRAVENOUS

## 2024-06-12 ENCOUNTER — Ambulatory Visit: Payer: Self-pay | Admitting: Emergency Medicine

## 2024-06-12 ENCOUNTER — Ambulatory Visit: Admitting: Emergency Medicine

## 2024-06-12 NOTE — Telephone Encounter (Signed)
 Pending elective eye surgery. Per PharmD and office protocol, may hold Eliquis  2-3 days pre procedure. Cardiac PET 05/2024 low risk with no ischemia.  However, now tentatively scheduled for ablation due to quick return of AFib post DCCV 07/2024. Will route to Dr. Nancey for his input how long he needs to be on uninterrupted OAC prior to ablation.   Carmel Waddington S Rudean Icenhour, NP

## 2024-06-13 NOTE — Telephone Encounter (Signed)
   Patient Name: DEKE TILGHMAN  DOB: July 16, 1946 MRN: [id number]  Primary Cardiologist: Dorn Lesches, MD  Chart reviewed as part of pre-operative protocol coverage. Given past medical history and time since last visit, based on ACC/AHA guidelines, LEMMIE VANLANEN is at acceptable risk for the planned procedure without further cardiovascular testing.   Per PharmD and office protocol may hold Eliquis  2-3 days before procedure. However due to ablation scheduled in August (tentatively 07/25/24) will need uninterrupted anticoagulation one month prior to ablation and from time of pre-ablation CT until 3 months after ablation.   Will ask nursing team to call and offer to cancel 06/17/24 visit if he wishes. His PET 06/11/24 was low risk with no ischemia and normal heart pumping function.   I will route this recommendation to the requesting party via Epic fax function. As he just saw Dr. Nancey 05/30/24 could reschedule general cardiology follow up in September.  Please call with questions.  Reche GORMAN Finder, NP 06/13/2024, 7:46 AM

## 2024-06-13 NOTE — Telephone Encounter (Signed)
 Called and spoke to pt. Full name and DOB verified. Pt made aware of APP recommendations stated below.  Mario Reche RAMAN, NP to [employer] [facility] Triage (Selected Message)  06/13/24  7:52 AM Please call to let him know (1) We sent clearance to eye doctor but would need ensure if he schedules eye surgery to take Eliquis  a full month prior to ablation and from pre-ablation CT to 3 mos after ablation (2) can reschedule 06/17/24 visit if he wishes as cardiac PET was normal  Pt requested to r/s ablation due to getting his eye surgery to be scheduled by week of 7/4. To also r/s 6/30 appt as well. Will send to scheduling team so they are aware.

## 2024-06-13 NOTE — Telephone Encounter (Deleted)
 Error

## 2024-06-13 NOTE — Telephone Encounter (Signed)
 Spoke with pt regarding 06/30 appt rs. Patient stated he was advised it was no longer needed due to clearance being received so he did not wish to rs.   He asked to rs ablation, which I advised is done by the nurse. Patient requested message be sent for him to receive a cb to do so.   Please advise.

## 2024-06-13 NOTE — Telephone Encounter (Deleted)
 Spoke with pt regarding 06/30 appt rs. Patient stated he was advised it was no longer needed due to clearance being received so he did not wish to rs.   He asked to rs ablation, which I advised is done by the nurse. Patient requested message be sent for him to receive a cb to do so.   Please advise.

## 2024-06-14 ENCOUNTER — Telehealth: Payer: Self-pay

## 2024-06-14 DIAGNOSIS — I480 Paroxysmal atrial fibrillation: Secondary | ICD-10-CM

## 2024-06-14 NOTE — Telephone Encounter (Signed)
 Spoke with patient, ablation moved to September 4 due to up coming eye surgery and having to hold eliquis . Per Dr Nancey, patient needs to be seen by his pulmonologist prior to ablation to ensure he can move forward with procedure. Patient understands and states he already has an appointment scheduled for July 7. No further needs at this time

## 2024-06-17 ENCOUNTER — Ambulatory Visit (HOSPITAL_BASED_OUTPATIENT_CLINIC_OR_DEPARTMENT_OTHER): Admitting: Nurse Practitioner

## 2024-06-18 ENCOUNTER — Telehealth: Payer: Self-pay

## 2024-06-18 DIAGNOSIS — J45909 Unspecified asthma, uncomplicated: Secondary | ICD-10-CM | POA: Diagnosis not present

## 2024-06-18 DIAGNOSIS — J449 Chronic obstructive pulmonary disease, unspecified: Secondary | ICD-10-CM | POA: Diagnosis not present

## 2024-06-18 NOTE — Telephone Encounter (Signed)
 Copy of chest-x-ray is upfront for pick up tomorrow.  Reason for CRM: Patient called requesting a copy of his CT Chest X-Ray he would like to stop by the office and pick it up.

## 2024-06-20 ENCOUNTER — Other Ambulatory Visit (HOSPITAL_COMMUNITY): Payer: Self-pay | Admitting: Specialist

## 2024-06-20 DIAGNOSIS — M5459 Other low back pain: Secondary | ICD-10-CM

## 2024-06-20 DIAGNOSIS — M546 Pain in thoracic spine: Secondary | ICD-10-CM | POA: Diagnosis not present

## 2024-06-22 ENCOUNTER — Ambulatory Visit (HOSPITAL_COMMUNITY)
Admission: RE | Admit: 2024-06-22 | Discharge: 2024-06-22 | Disposition: A | Discharge status: Home / Self Care | Source: Ambulatory Visit | Attending: Specialist | Admitting: Specialist

## 2024-06-22 DIAGNOSIS — M4317 Spondylolisthesis, lumbosacral region: Secondary | ICD-10-CM | POA: Diagnosis not present

## 2024-06-22 DIAGNOSIS — M4808 Spinal stenosis, sacral and sacrococcygeal region: Secondary | ICD-10-CM | POA: Diagnosis not present

## 2024-06-22 DIAGNOSIS — M5459 Other low back pain: Secondary | ICD-10-CM | POA: Diagnosis not present

## 2024-06-22 DIAGNOSIS — M48061 Spinal stenosis, lumbar region without neurogenic claudication: Secondary | ICD-10-CM | POA: Diagnosis not present

## 2024-06-22 DIAGNOSIS — M4805 Spinal stenosis, thoracolumbar region: Secondary | ICD-10-CM | POA: Diagnosis not present

## 2024-06-24 ENCOUNTER — Ambulatory Visit (INDEPENDENT_AMBULATORY_CARE_PROVIDER_SITE_OTHER): Admitting: Family Medicine

## 2024-06-24 ENCOUNTER — Encounter: Payer: Self-pay | Admitting: Family Medicine

## 2024-06-24 ENCOUNTER — Ambulatory Visit (HOSPITAL_BASED_OUTPATIENT_CLINIC_OR_DEPARTMENT_OTHER): Admitting: Family

## 2024-06-24 VITALS — BP 124/68 | HR 98 | Temp 97.9°F | Ht 68.0 in | Wt 213.6 lb

## 2024-06-24 DIAGNOSIS — I480 Paroxysmal atrial fibrillation: Secondary | ICD-10-CM

## 2024-06-24 DIAGNOSIS — J4550 Severe persistent asthma, uncomplicated: Secondary | ICD-10-CM | POA: Diagnosis not present

## 2024-06-24 DIAGNOSIS — M25562 Pain in left knee: Secondary | ICD-10-CM | POA: Diagnosis not present

## 2024-06-24 DIAGNOSIS — J4489 Other specified chronic obstructive pulmonary disease: Secondary | ICD-10-CM

## 2024-06-24 NOTE — Progress Notes (Signed)
 Subjective:    Patient ID: Mario [name], male    DOB: 28-Oct-1946, 78 y.o.   MRN: [id number] Patient presents today concerned that he may need an antibiotic.  He has a history of poorly controlled asthma with an elevated IgE level with severe allergy  to mildew mold and dust.  Recently started on Dupixent .  Has been on the medication for 1 month.  Pulmonology also has him on 10 mg of prednisone  in addition to Symbicort .  Today on exam he is not wheezing at all.  He denies any shortness of breath.  He denies any fever.  He does have a cough productive of mucus.  He states that is a small amount of greenish-brown mucus.  However this is his baseline.  He has a history of bibasilar bronchiectasis seen on a recent CT scan.  Patient has a cough productive of sweats on a daily basis.  There has not been a change in the consistency, quantity, or type of mucus. Past Medical History:  Diagnosis Date   Arrhythmia    Arthritis    Asthma    Atrial fibrillation (HCC)    Biliary colic 2014   Bronchiectasis (HCC)    2021   Cataract    COPD (chronic obstructive pulmonary disease) with chronic bronchitis (HCC)    Coronary artery calcification seen on CAT scan    Cyst of pancreas    1 cm (7/18), repeat ct in 12 months.     Hyperlipidemia    Hypertension    Obstructive sleep apnea    Renal disorder    Rhinitis    Sleep apnea    pt denies sleep apnea-lost weight   Past Surgical History:  Procedure Laterality Date   CARDIOVERSION N/A 05/14/2024   Procedure: CARDIOVERSION;  Surgeon: Michele Richardson, DO;  Location: [facility];  Service: Cardiovascular;  Laterality: N/A;   CHOLECYSTECTOMY     COLONOSCOPY  09/29/2014   INCISION AND DRAINAGE OF WOUND Left 03/13/2024   Procedure: IRRIGATION AND DEBRIDEMENT WOUND;  Surgeon: Shari Easter, MD;  Location: [facility];  Service: Orthopedics;  Laterality: Left;  IRRIGATION AND DEBRIDEMENT FIFTH FINGER LEFT   LAPAROSCOPIC CHOLECYSTECTOMY SINGLE SITE WITH  INTRAOPERATIVE CHOLANGIOGRAM N/A 09/15/2019   Procedure: LAPAROSCOPIC CHOLECYSTECTOMY SINGLE SITE;  Surgeon: Sheldon Standing, MD;  Location: [facility];  Service: General;  Laterality: N/A;   LITHOTRIPSY  2000   for kidney stone   Current Outpatient Medications on File Prior to Visit  Medication Sig Dispense Refill   acetaminophen  (TYLENOL ) 500 MG tablet Take 1,000 mg by mouth every 6 (six) hours as needed for mild pain (pain score 1-3).     apixaban  (ELIQUIS ) 5 MG TABS tablet TAKE 1 TABLET BY MOUTH TWICE A DAY 60 tablet 5   Ascorbic Acid (VITAMIN C) 1000 MG tablet Take 1,000 mg by mouth 2 (two) times daily.     benzonatate  (TESSALON ) 200 MG capsule Take 1 capsule (200 mg total) by mouth 3 (three) times daily as needed for cough. 30 capsule 1   budesonide -formoterol  (SYMBICORT ) 160-4.5 MCG/ACT inhaler Inhale 2 puffs into the lungs in the morning and at bedtime. Generic medication 1 each 6   Cholecalciferol (VITAMIN D3) 125 MCG (5000 UT) TABS Take 1 tablet by mouth 2 (two) times daily.     Coenzyme Q10 (CO Q-10) 100 MG CAPS Take 1 capsule by mouth daily.     diltiazem  (DILACOR XR ) 240 MG 24 hr capsule Take 1 capsule (240 mg total) by mouth  daily. 90 capsule 1   Dupilumab  (DUPIXENT ) 100 MG/0.67ML SOSY Inject into the skin.     FIBER COMPLETE PO Take 1 tablet by mouth daily.     furosemide  (LASIX ) 40 MG tablet Take 1 tablet (40 mg total) by mouth daily. Take additional 20 mg if your weight is 3 lbs in 1 day of 5 lbs in 1 week 30 tablet 3   guaiFENesin  (MUCINEX ) 600 MG 12 hr tablet Take 1 tablet (600 mg total) by mouth 2 (two) times daily. 60 tablet 0   levalbuterol  (XOPENEX  HFA) 45 MCG/ACT inhaler Inhale 2 puffs into the lungs every 6 (six) hours as needed for wheezing. 1 each 12   metoprolol  succinate (TOPROL -XL) 100 MG 24 hr tablet Take 1 tablet (100 mg total) by mouth daily. Take with or immediately following a meal. 90 tablet 3   montelukast  (SINGULAIR ) 10 MG tablet TAKE 1 TABLET BY MOUTH EVERY DAY  90 tablet 2   Olopatadine-Mometasone  (RYALTRIS ) 665-25 MCG/ACT SUSP 2 sprays each nostril 2 times per day 29 g 3   pantoprazole  (PROTONIX ) 40 MG tablet TAKE 1 TABLET BY MOUTH TWICE A DAY 180 tablet 2   predniSONE  (DELTASONE ) 10 MG tablet Take 1 tablet (10 mg total) by mouth daily with breakfast. 30 tablet 3   Respiratory Therapy Supplies (FLUTTER) DEVI Use as directed 1 each 0   rosuvastatin  (CRESTOR ) 20 MG tablet TAKE 1 TABLET BY MOUTH EVERY DAY **D/C LIPITOR** (Patient taking differently: Take 20 mg by mouth every evening.) 90 tablet 2   levalbuterol  (XOPENEX ) 0.63 MG/3ML nebulizer solution Take 3 mLs (0.63 mg total) by nebulization 4 (four) times daily for 7 days, THEN 3 mLs (0.63 mg total) every 6 (six) hours as needed for wheezing or shortness of breath. (Patient not taking: No sig reported)     No current facility-administered medications on file prior to visit.   Allergies  Allergen Reactions   Dust Mite Extract Other (See Comments)   Social History   Socioeconomic History   Marital status: Married    Spouse name: Not on file   Number of children: Not on file   Years of education: Not on file   Highest education level: Bachelor's degree (e.g., BA, AB, BS)  Occupational History   Not on file  Tobacco Use   Smoking status: Former    Current packs/day: 0.00    Average packs/day: 2.0 packs/day for 11.0 years (22.0 ttl pk-yrs)    Types: Cigarettes    Start date: 12/20/1963    Quit date: 12/19/1974    Years since quitting: 49.5   Smokeless tobacco: Never  Vaping Use   Vaping status: Never Used  Substance and Sexual Activity   Alcohol use: Not Currently    Alcohol/week: 1.0 standard drink of alcohol   Drug use: No   Sexual activity: Not on file  Other Topics Concern   Not on file  Social History Narrative   Not on file   Social Drivers of Health   Financial Resource Strain: High Risk (06/08/2024)   Overall Financial Resource Strain (CARDIA)    Difficulty of Paying Living  Expenses: Hard  Food Insecurity: No Food Insecurity (06/08/2024)   Hunger Vital Sign    Worried About Running Out of Food in the Last Year: Never true    Ran Out of Food in the Last Year: Never true  Transportation Needs: No Transportation Needs (06/08/2024)   PRAPARE - Administrator, Civil Service (Medical): No  Lack of Transportation (Non-Medical): No  Physical Activity: Inactive (06/08/2024)   Exercise Vital Sign    Days of Exercise per Week: 0 days    Minutes of Exercise per Session: Not on file  Stress: No Stress Concern Present (06/08/2024)   [employer] of Occupational Health - Occupational Stress Questionnaire    Feeling of Stress: Not at all  Social Connections: Socially Integrated (06/08/2024)   Social Connection and Isolation Panel    Frequency of Communication with Friends and Family: More than three times a week    Frequency of Social Gatherings with Friends and Family: More than three times a week    Attends Religious Services: More than 4 times per year    Active Member of [employer] or Organizations: Yes    Attends Banker Meetings: More than 4 times per year    Marital Status: Married  Catering manager Violence: Not At Risk (05/08/2024)   Humiliation, Afraid, Rape, and Kick questionnaire    Fear of Current or Ex-Partner: No    Emotionally Abused: No    Physically Abused: No    Sexually Abused: No      Review of Systems  Respiratory:  Positive for cough.   All other systems reviewed and are negative.      Objective:   Physical Exam Vitals reviewed.  Constitutional:      Appearance: He is well-developed.  HENT:     Head: Normocephalic and atraumatic.     Nose: Nose normal.     Mouth/Throat:     Pharynx: No oropharyngeal exudate.  Neck:     Vascular: No carotid bruit.  Cardiovascular:     Rate and Rhythm: Normal rate. Rhythm irregular.     Heart sounds: Normal heart sounds.  Pulmonary:     Effort: Pulmonary effort is normal.  No tachypnea or respiratory distress.     Breath sounds: No stridor or decreased air movement. Examination of the right-lower field reveals rales. Examination of the left-lower field reveals rales. Rales present. No wheezing or rhonchi.  Musculoskeletal:     Right lower leg: No edema.     Left lower leg: No edema.           Assessment & Plan:  Arthralgia of left lower leg - Plan: Up in chair  Asthma with COPD (HCC) - Plan: Up in chair  Paroxysmal atrial fibrillation (HCC) - Plan: Up in chair  Very poorly controlled severe persistent asthma - Plan: Up in chair Patient has chronic bibasilar Rales consistent with his bronchiectasis.  However his exam is at his baseline.  His lungs actually sound better today with less wheezing and better air movement than they have in the past.  There has not been any change in the consistency, color, quantity of the mucus he produces.  Therefore I do not feel that he requires an antibiotic at the present time.  If he develops any shortness of breath or fever or change in mucus, he will call me immediately.

## 2024-06-25 ENCOUNTER — Telehealth: Payer: Self-pay

## 2024-06-25 DIAGNOSIS — J4540 Moderate persistent asthma, uncomplicated: Secondary | ICD-10-CM | POA: Diagnosis not present

## 2024-06-25 DIAGNOSIS — M8008XA Age-related osteoporosis with current pathological fracture, vertebra(e), initial encounter for fracture: Secondary | ICD-10-CM | POA: Diagnosis not present

## 2024-06-25 NOTE — Telephone Encounter (Signed)
 Order for wheelchair faxed to [facility] at fax # [phone]. Phone number [phone]. Mjp,lpn

## 2024-06-27 ENCOUNTER — Ambulatory Visit: Admitting: Family Medicine

## 2024-06-28 DIAGNOSIS — S22080A Wedge compression fracture of T11-T12 vertebra, initial encounter for closed fracture: Secondary | ICD-10-CM | POA: Diagnosis not present

## 2024-07-01 DIAGNOSIS — Z48810 Encounter for surgical aftercare following surgery on the sense organs: Secondary | ICD-10-CM | POA: Diagnosis not present

## 2024-07-01 DIAGNOSIS — T85398A Other mechanical complication of other ocular prosthetic devices, implants and grafts, initial encounter: Secondary | ICD-10-CM | POA: Diagnosis not present

## 2024-07-01 DIAGNOSIS — H43391 Other vitreous opacities, right eye: Secondary | ICD-10-CM | POA: Diagnosis not present

## 2024-07-01 DIAGNOSIS — Z8669 Personal history of other diseases of the nervous system and sense organs: Secondary | ICD-10-CM | POA: Diagnosis not present

## 2024-07-02 NOTE — Progress Notes (Signed)
 According to the patients chart, Mario [name] has contacted patient. And all results are in the chart.

## 2024-07-03 NOTE — Telephone Encounter (Signed)
 Request from [facility] for preoperative clearance. Please advise we do not provide clearance, simply risk stratification. Surgery is scheduled 04/18/2024. Pt currently having exacerbation. He is being treated for this. He informed me he would be awake during surgery; however, anesthesia note lists MAC as anesthesia option. He was advised that if he was not improve and back to baseline, surgery would need to be postponed. I have also advised him to notify surgeon that he is currently on prednisone , as I'm not sure if this will result in them needing to delay his procedure.  I have completed risk stratification based on the above and it is documented in his recent OV. Thanks.   Dr. Jarold - surgical coordinators [phone]

## 2024-07-03 NOTE — Telephone Encounter (Signed)
 Copy of this encounter and the ov note from 06/04/24 was faxed to Dr [place].

## 2024-07-04 ENCOUNTER — Ambulatory Visit: Payer: Self-pay

## 2024-07-04 NOTE — Telephone Encounter (Signed)
 FYI Only or Action Required?: Action required by provider: request for appointment.  Patient was last seen in primary care on 06/24/2024 by [name] DASEN, MD.  Called Nurse Triage reporting Shortness of Breath.  Symptoms began several days ago.  Interventions attempted: Prescription medications: inhaler.  Symptoms are: gradually worsening.  Triage Disposition: See PCP When Office is Open (Within 3 Days)  Patient/caregiver understands and will follow disposition?: YesCopied from CRM [id number]. Topic: Clinical - Red Word Triage >> Jul 04, 2024  2:22 PM Carla L wrote: Red Word that prompted transfer to Nurse Triage: shortness of breath, asthma flare up Reason for Disposition  [1] MODERATE longstanding difficulty breathing (e.g., speaks in phrases, SOB even at rest, pulse 100-120) AND [2] SAME as normal  Answer Assessment - Initial Assessment Questions 1. RESPIRATORY STATUS: Describe your breathing? (e.g., wheezing, shortness of breath, unable to speak, severe coughing)      Wheezing  2. ONSET: When did this breathing problem begin?      Several days ago  3. PATTERN Does the difficult breathing come and go, or has it been constant since it started?      Constant  4. SEVERITY: How bad is your breathing? (e.g., mild, moderate, severe)      moderate 5. RECURRENT SYMPTOM: Have you had difficulty breathing before? If Yes, ask: When was the last time? and What happened that time?      Month ago 6. CARDIAC HISTORY: Do you have any history of heart disease? (e.g., heart attack, angina, bypass surgery, angioplasty)      Na  7. LUNG HISTORY: Do you have any history of lung disease?  (e.g., pulmonary embolus, asthma, emphysema)     Asthma, COPD,  8. CAUSE: What do you think is causing the breathing problem?      Asthma flare ukp 9. OTHER SYMPTOMS: Do you have any other symptoms? (e.g., chest pain, cough, dizziness, fever, runny nose)     Coughing up green mucus  10. O2  SATURATION MONITOR:  Do you use an oxygen saturation monitor (pulse oximeter) at home? If Yes, ask: What is your reading (oxygen level) today? What is your usual oxygen saturation reading? (e.g., 95%) 96%  Pt has chronic breathing issues.Pt has  SOB while walking short distances. Cough is productive and green. RN could hear wheezing. Pt is using breathing treatment.  Pt only wants to see Dr Duanne. Pt seems somewhat reluctant to being triaged and just wanted to make appt. Not sure all important info was shared.  Protocols used: Breathing Difficulty-A-AH

## 2024-07-05 ENCOUNTER — Ambulatory Visit: Admitting: Family Medicine

## 2024-07-05 ENCOUNTER — Encounter: Payer: Self-pay | Admitting: Family Medicine

## 2024-07-05 VITALS — BP 120/80 | HR 90 | Temp 99.6°F | Ht 68.0 in | Wt 212.4 lb

## 2024-07-05 DIAGNOSIS — R768 Other specified abnormal immunological findings in serum: Secondary | ICD-10-CM

## 2024-07-05 DIAGNOSIS — J4489 Other specified chronic obstructive pulmonary disease: Secondary | ICD-10-CM

## 2024-07-05 DIAGNOSIS — J471 Bronchiectasis with (acute) exacerbation: Secondary | ICD-10-CM

## 2024-07-05 LAB — RESPIRATORY CULTURE OR RESPIRATORY AND SPUTUM CULTURE: MICRO NUMBER:: 16538003

## 2024-07-05 LAB — MYCOBACTERIA,CULT W/FLUOROCHROME SMEAR
MICRO NUMBER:: 16538002
SMEAR:: NONE SEEN
SPECIMEN QUALITY:: ADEQUATE

## 2024-07-05 MED ORDER — LEVOFLOXACIN 500 MG PO TABS: 500.0000 mg | ORAL_TABLET | Freq: Every day | ORAL | 0 refills | Status: DC

## 2024-07-05 MED ORDER — PREDNISONE 20 MG PO TABS: 60.0000 mg | ORAL_TABLET | Freq: Every day | ORAL | 0 refills | Status: DC

## 2024-07-05 NOTE — Progress Notes (Signed)
 Subjective:    Patient ID: Mario [name], male    DOB: 06-29-1946, 78 y.o.   MRN: [id number]  Patient presents with a low-grade fever of 99.6.  He reports several days of shortness of breath over the last 3 days especially.  He started feeling poorly yesterday.  Today he feels much worse.  On exam, he has bibasilar crackles and rhonchi.  The right sound encompasses the lower end upper quadrant.  He has basilar crackles on the left side.  He has diffuse expiratory wheezing.  This is markedly abnormal from his previous visit earlier this month. Past Medical History:  Diagnosis Date   Arrhythmia    Arthritis    Asthma    Atrial fibrillation (HCC)    Biliary colic 2014   Bronchiectasis (HCC)    2021   Cataract    COPD (chronic obstructive pulmonary disease) with chronic bronchitis (HCC)    Coronary artery calcification seen on CAT scan    Cyst of pancreas    1 cm (7/18), repeat ct in 12 months.     Hyperlipidemia    Hypertension    Obstructive sleep apnea    Renal disorder    Rhinitis    Sleep apnea    pt denies sleep apnea-lost weight   Past Surgical History:  Procedure Laterality Date   CARDIOVERSION N/A 05/14/2024   Procedure: CARDIOVERSION;  Surgeon: Michele Richardson, DO;  Location: [facility];  Service: Cardiovascular;  Laterality: N/A;   CHOLECYSTECTOMY     COLONOSCOPY  09/29/2014   INCISION AND DRAINAGE OF WOUND Left 03/13/2024   Procedure: IRRIGATION AND DEBRIDEMENT WOUND;  Surgeon: Shari Easter, MD;  Location: [facility];  Service: Orthopedics;  Laterality: Left;  IRRIGATION AND DEBRIDEMENT FIFTH FINGER LEFT   LAPAROSCOPIC CHOLECYSTECTOMY SINGLE SITE WITH INTRAOPERATIVE CHOLANGIOGRAM N/A 09/15/2019   Procedure: LAPAROSCOPIC CHOLECYSTECTOMY SINGLE SITE;  Surgeon: Sheldon Standing, MD;  Location: [facility];  Service: General;  Laterality: N/A;   LITHOTRIPSY  2000   for kidney stone   Current Outpatient Medications on File Prior to Visit  Medication Sig Dispense Refill    acetaminophen  (TYLENOL ) 500 MG tablet Take 1,000 mg by mouth every 6 (six) hours as needed for mild pain (pain score 1-3).     apixaban  (ELIQUIS ) 5 MG TABS tablet TAKE 1 TABLET BY MOUTH TWICE A DAY 60 tablet 5   Ascorbic Acid (VITAMIN C) 1000 MG tablet Take 1,000 mg by mouth 2 (two) times daily.     benzonatate  (TESSALON ) 200 MG capsule Take 1 capsule (200 mg total) by mouth 3 (three) times daily as needed for cough. 30 capsule 1   budesonide -formoterol  (SYMBICORT ) 160-4.5 MCG/ACT inhaler Inhale 2 puffs into the lungs in the morning and at bedtime. Generic medication 1 each 6   Cholecalciferol (VITAMIN D3) 125 MCG (5000 UT) TABS Take 1 tablet by mouth 2 (two) times daily.     Coenzyme Q10 (CO Q-10) 100 MG CAPS Take 1 capsule by mouth daily.     diltiazem  (DILACOR XR ) 240 MG 24 hr capsule Take 1 capsule (240 mg total) by mouth daily. 90 capsule 1   Dupilumab  (DUPIXENT ) 100 MG/0.67ML SOSY Inject into the skin.     FIBER COMPLETE PO Take 1 tablet by mouth daily.     furosemide  (LASIX ) 40 MG tablet Take 1 tablet (40 mg total) by mouth daily. Take additional 20 mg if your weight is 3 lbs in 1 day of 5 lbs in 1 week 30 tablet  3   guaiFENesin  (MUCINEX ) 600 MG 12 hr tablet Take 1 tablet (600 mg total) by mouth 2 (two) times daily. 60 tablet 0   levalbuterol  (XOPENEX  HFA) 45 MCG/ACT inhaler Inhale 2 puffs into the lungs every 6 (six) hours as needed for wheezing. 1 each 12   levalbuterol  (XOPENEX ) 0.63 MG/3ML nebulizer solution Take 3 mLs (0.63 mg total) by nebulization 4 (four) times daily for 7 days, THEN 3 mLs (0.63 mg total) every 6 (six) hours as needed for wheezing or shortness of breath. (Patient not taking: No sig reported)     metoprolol  succinate (TOPROL -XL) 100 MG 24 hr tablet Take 1 tablet (100 mg total) by mouth daily. Take with or immediately following a meal. 90 tablet 3   montelukast  (SINGULAIR ) 10 MG tablet TAKE 1 TABLET BY MOUTH EVERY DAY 90 tablet 2   Olopatadine-Mometasone  (RYALTRIS )  665-25 MCG/ACT SUSP 2 sprays each nostril 2 times per day 29 g 3   pantoprazole  (PROTONIX ) 40 MG tablet TAKE 1 TABLET BY MOUTH TWICE A DAY 180 tablet 2   predniSONE  (DELTASONE ) 10 MG tablet Take 1 tablet (10 mg total) by mouth daily with breakfast. 30 tablet 3   Respiratory Therapy Supplies (FLUTTER) DEVI Use as directed 1 each 0   rosuvastatin  (CRESTOR ) 20 MG tablet TAKE 1 TABLET BY MOUTH EVERY DAY **D/C LIPITOR** (Patient taking differently: Take 20 mg by mouth every evening.) 90 tablet 2   No current facility-administered medications on file prior to visit.   Allergies  Allergen Reactions   Dust Mite Extract Other (See Comments)   Social History   Socioeconomic History   Marital status: Married    Spouse name: Not on file   Number of children: Not on file   Years of education: Not on file   Highest education level: Bachelor's degree (e.g., BA, AB, BS)  Occupational History   Not on file  Tobacco Use   Smoking status: Former    Current packs/day: 0.00    Average packs/day: 2.0 packs/day for 11.0 years (22.0 ttl pk-yrs)    Types: Cigarettes    Start date: 12/20/1963    Quit date: 12/19/1974    Years since quitting: 49.5   Smokeless tobacco: Never  Vaping Use   Vaping status: Never Used  Substance and Sexual Activity   Alcohol use: Not Currently    Alcohol/week: 1.0 standard drink of alcohol   Drug use: No   Sexual activity: Not on file  Other Topics Concern   Not on file  Social History Narrative   Not on file   Social Drivers of Health   Financial Resource Strain: High Risk (06/08/2024)   Overall Financial Resource Strain (CARDIA)    Difficulty of Paying Living Expenses: Hard  Food Insecurity: No Food Insecurity (06/08/2024)   Hunger Vital Sign    Worried About Running Out of Food in the Last Year: Never true    Ran Out of Food in the Last Year: Never true  Transportation Needs: No Transportation Needs (06/08/2024)   PRAPARE - Administrator, Civil Service  (Medical): No    Lack of Transportation (Non-Medical): No  Physical Activity: Inactive (06/08/2024)   Exercise Vital Sign    Days of Exercise per Week: 0 days    Minutes of Exercise per Session: Not on file  Stress: No Stress Concern Present (06/08/2024)   [employer] of Occupational Health - Occupational Stress Questionnaire    Feeling of Stress: Not at all  Social  Connections: Socially Integrated (06/08/2024)   Social Connection and Isolation Panel    Frequency of Communication with Friends and Family: More than three times a week    Frequency of Social Gatherings with Friends and Family: More than three times a week    Attends Religious Services: More than 4 times per year    Active Member of [employer] or Organizations: Yes    Attends Banker Meetings: More than 4 times per year    Marital Status: Married  Catering manager Violence: Not At Risk (05/08/2024)   Humiliation, Afraid, Rape, and Kick questionnaire    Fear of Current or Ex-Partner: No    Emotionally Abused: No    Physically Abused: No    Sexually Abused: No      Review of Systems  Respiratory:  Positive for cough.   All other systems reviewed and are negative.      Objective:   Physical Exam Vitals reviewed.  Constitutional:      Appearance: He is well-developed.  HENT:     Head: Normocephalic and atraumatic.     Nose: Nose normal.     Mouth/Throat:     Pharynx: No oropharyngeal exudate.  Neck:     Vascular: No carotid bruit.  Cardiovascular:     Rate and Rhythm: Normal rate. Rhythm irregular.     Heart sounds: Normal heart sounds.  Pulmonary:     Effort: Pulmonary effort is normal. No tachypnea or respiratory distress.     Breath sounds: No stridor or decreased air movement. Examination of the right-upper field reveals wheezing and rales. Examination of the left-upper field reveals wheezing. Examination of the right-middle field reveals wheezing, rhonchi and rales. Examination of the  left-middle field reveals wheezing. Examination of the right-lower field reveals wheezing, rhonchi and rales. Examination of the left-lower field reveals wheezing, rhonchi and rales. Wheezing, rhonchi and rales present.           Assessment & Plan:  Bronchiectasis with (acute) exacerbation (HCC)  Asthma with COPD (HCC)  Elevated IgE level Patient is on month 2 of Dupixent .  However he is certainly having an asthma exacerbation today coupled with what sounds like purulent sputum and acute bronchitis.  Begin Levaquin  500 milligrams daily for 7 days.  Begin prednisone  taper pack.  He will take 60 mg a day for 3 days, 40 mg a day for 3 days, then 20 mg a day for 3 days.  Then resume 10 mg a day.  Use albuterol  2 puffs every 6 hours.

## 2024-07-08 ENCOUNTER — Ambulatory Visit

## 2024-07-08 DIAGNOSIS — J4550 Severe persistent asthma, uncomplicated: Secondary | ICD-10-CM

## 2024-07-08 MED ORDER — DUPILUMAB 300 MG/2ML ~~LOC~~ SOSY
300.0000 mg | PREFILLED_SYRINGE | SUBCUTANEOUS | Status: DC
  Administered 2024-05-28 – 2024-07-22 (×3): 300 mg via SUBCUTANEOUS

## 2024-07-08 NOTE — Progress Notes (Signed)
 AFB final negative

## 2024-07-09 DIAGNOSIS — H43391 Other vitreous opacities, right eye: Secondary | ICD-10-CM | POA: Diagnosis not present

## 2024-07-09 DIAGNOSIS — Z9889 Other specified postprocedural states: Secondary | ICD-10-CM | POA: Diagnosis not present

## 2024-07-09 DIAGNOSIS — T85398D Other mechanical complication of other ocular prosthetic devices, implants and grafts, subsequent encounter: Secondary | ICD-10-CM | POA: Diagnosis not present

## 2024-07-11 ENCOUNTER — Ambulatory Visit: Payer: Self-pay

## 2024-07-11 NOTE — Telephone Encounter (Signed)
 FYI Only or Action Required?: FYI only for provider.  Patient was last seen in primary care on 07/05/2024 by [name] DASEN, MD.  Called Nurse Triage reporting Shortness of Breath.  Symptoms began a week ago.  Interventions attempted: Prescription medications: ABx and ALbuterol  .  Symptoms are: gradually improving.  Triage Disposition: See PCP Within 2 Weeks  Patient/caregiver understands and will follow disposition?: yes             Copied from CRM #[id number]. Topic: Clinical - Red Word Triage >> Jul 11, 2024  8:59 AM Rosaria BRAVO wrote: Red Word that prompted transfer to Nurse Triage: Shortness of breath    ----------------------------------------------------------------------- From previous Reason for Contact - Scheduling: Patient/patient representative is calling to schedule an appointment. Refer to attachments for appointment information. Reason for Disposition  [1] MILD longstanding difficulty breathing (e.g., minimal/no SOB at rest, SOB with walking, pulse < 100) AND [2] SAME as normal  Answer Assessment - Initial Assessment Questions 1. RESPIRATORY STATUS: Describe your breathing? (e.g., wheezing, shortness of breath, unable to speak, severe coughing)      SOB 2. ONSET: When did this breathing problem begin?      Seen last Friday now not breathing back to normal  3. PATTERN Does the difficult breathing come and go, or has it been constant since it started?      comes 5. RECURRENT SYMPTOM: Have you had difficulty breathing before? If Yes, ask: When was the last time? and What happened that time?      Yes on abx stated half better 7. LUNG HISTORY: Do you have any history of lung disease?  (e.g., pulmonary embolus, asthma, emphysema)     asthma 8. CAUSE: What do you think is causing the breathing problem?      Infection/asthma  9. OTHER SYMPTOMS: Do you have any other symptoms? (e.g., chest pain, cough, dizziness, fever, runny nose)     Cough,  wheezing  Protocols used: Breathing Difficulty-A-AH

## 2024-07-12 ENCOUNTER — Encounter: Payer: Self-pay | Admitting: Family Medicine

## 2024-07-12 ENCOUNTER — Ambulatory Visit (INDEPENDENT_AMBULATORY_CARE_PROVIDER_SITE_OTHER): Admitting: Family Medicine

## 2024-07-12 VITALS — BP 118/62 | HR 111 | Temp 97.9°F | Ht 68.0 in | Wt 212.0 lb

## 2024-07-12 DIAGNOSIS — J471 Bronchiectasis with (acute) exacerbation: Secondary | ICD-10-CM | POA: Diagnosis not present

## 2024-07-12 MED ORDER — AMOXICILLIN-POT CLAVULANATE 875-125 MG PO TABS
1.0000 | ORAL_TABLET | Freq: Two times a day (BID) | ORAL | 0 refills | Status: DC
Start: 2024-07-12 — End: 2024-08-09

## 2024-07-12 MED ORDER — AZITHROMYCIN 250 MG PO TABS: ORAL_TABLET | ORAL | 0 refills | Status: DC

## 2024-07-12 NOTE — Progress Notes (Signed)
 Subjective:    Patient ID: Mario [name], male    DOB: [birth year], 78 y.o.   MRN: [id number] Patient was seen on July 18.  He was started on prednisone  and Levaquin .  He continues to have purulent mucus and sputum every morning.  He continues to report thick mucus in his airways that seems to choke him and make it difficult for him to breathe.  He is using Xopenex  3 times daily and occasional albuterol  nebulizer treatments.  His heart rate is around 110 bpm today but not out of control.  There is no pitting edema in his extremities.  There is no crackles to suggest pulmonary edema.  He denies any chest pain.  Recent stress test showed no evidence of coronary artery ischemia.  Patient continues to have shortness of breath with minimal activity with coughing and purulent sputum and mucus plugging Past Medical History:  Diagnosis Date   Arrhythmia    Arthritis    Asthma    Atrial fibrillation (HCC)    Biliary colic 2014   Bronchiectasis (HCC)    2021   Cataract    COPD (chronic obstructive pulmonary disease) with chronic bronchitis (HCC)    Coronary artery calcification seen on CAT scan    Cyst of pancreas    1 cm (7/18), repeat ct in 12 months.     Hyperlipidemia    Hypertension    Obstructive sleep apnea    Renal disorder    Rhinitis    Sleep apnea    pt denies sleep apnea-lost weight   Past Surgical History:  Procedure Laterality Date   CARDIOVERSION N/A 05/14/2024   Procedure: CARDIOVERSION;  Surgeon: Michele Richardson, DO;  Location: [facility];  Service: Cardiovascular;  Laterality: N/A;   CHOLECYSTECTOMY     COLONOSCOPY  09/29/2014   INCISION AND DRAINAGE OF WOUND Left 03/13/2024   Procedure: IRRIGATION AND DEBRIDEMENT WOUND;  Surgeon: Shari Easter, MD;  Location: [facility];  Service: Orthopedics;  Laterality: Left;  IRRIGATION AND DEBRIDEMENT FIFTH FINGER LEFT   LAPAROSCOPIC CHOLECYSTECTOMY SINGLE SITE WITH INTRAOPERATIVE CHOLANGIOGRAM N/A 09/15/2019   Procedure:  LAPAROSCOPIC CHOLECYSTECTOMY SINGLE SITE;  Surgeon: Sheldon Standing, MD;  Location: [facility];  Service: General;  Laterality: N/A;   LITHOTRIPSY  2000   for kidney stone   Current Outpatient Medications on File Prior to Visit  Medication Sig Dispense Refill   acetaminophen  (TYLENOL ) 500 MG tablet Take 1,000 mg by mouth every 6 (six) hours as needed for mild pain (pain score 1-3).     apixaban  (ELIQUIS ) 5 MG TABS tablet TAKE 1 TABLET BY MOUTH TWICE A DAY 60 tablet 5   Ascorbic Acid (VITAMIN C) 1000 MG tablet Take 1,000 mg by mouth 2 (two) times daily.     benzonatate  (TESSALON ) 200 MG capsule Take 1 capsule (200 mg total) by mouth 3 (three) times daily as needed for cough. 30 capsule 1   budesonide -formoterol  (SYMBICORT ) 160-4.5 MCG/ACT inhaler Inhale 2 puffs into the lungs in the morning and at bedtime. Generic medication 1 each 6   Cholecalciferol (VITAMIN D3) 125 MCG (5000 UT) TABS Take 1 tablet by mouth 2 (two) times daily.     Coenzyme Q10 (CO Q-10) 100 MG CAPS Take 1 capsule by mouth daily.     diltiazem  (DILACOR XR ) 240 MG 24 hr capsule Take 1 capsule (240 mg total) by mouth daily. 90 capsule 1   Dupilumab  (DUPIXENT ) 100 MG/0.67ML SOSY Inject into the skin.     FIBER  COMPLETE PO Take 1 tablet by mouth daily.     furosemide  (LASIX ) 40 MG tablet Take 1 tablet (40 mg total) by mouth daily. Take additional 20 mg if your weight is 3 lbs in 1 day of 5 lbs in 1 week 30 tablet 3   guaiFENesin  (MUCINEX ) 600 MG 12 hr tablet Take 1 tablet (600 mg total) by mouth 2 (two) times daily. 60 tablet 0   levalbuterol  (XOPENEX  HFA) 45 MCG/ACT inhaler Inhale 2 puffs into the lungs every 6 (six) hours as needed for wheezing. 1 each 12   metoprolol  succinate (TOPROL -XL) 100 MG 24 hr tablet Take 1 tablet (100 mg total) by mouth daily. Take with or immediately following a meal. 90 tablet 3   montelukast  (SINGULAIR ) 10 MG tablet TAKE 1 TABLET BY MOUTH EVERY DAY 90 tablet 2   Olopatadine-Mometasone  (RYALTRIS ) 665-25  MCG/ACT SUSP 2 sprays each nostril 2 times per day 29 g 3   pantoprazole  (PROTONIX ) 40 MG tablet TAKE 1 TABLET BY MOUTH TWICE A DAY 180 tablet 2   predniSONE  (DELTASONE ) 10 MG tablet Take 1 tablet (10 mg total) by mouth daily with breakfast. 30 tablet 3   predniSONE  (DELTASONE ) 20 MG tablet Take 3 tablets (60 mg total) by mouth daily with breakfast. 30 tablet 0   Respiratory Therapy Supplies (FLUTTER) DEVI Use as directed 1 each 0   rosuvastatin  (CRESTOR ) 20 MG tablet TAKE 1 TABLET BY MOUTH EVERY DAY **D/C LIPITOR** (Patient taking differently: Take 20 mg by mouth every evening.) 90 tablet 2   levalbuterol  (XOPENEX ) 0.63 MG/3ML nebulizer solution Take 3 mLs (0.63 mg total) by nebulization 4 (four) times daily for 7 days, THEN 3 mLs (0.63 mg total) every 6 (six) hours as needed for wheezing or shortness of breath. (Patient not taking: No sig reported)     Current Facility-Administered Medications on File Prior to Visit  Medication Dose Route Frequency Provider Last Rate Last Admin   dupilumab  (DUPIXENT ) prefilled syringe 300 mg  300 mg Subcutaneous Q14 Days Kozlow, Eric J, MD   300 mg at 07/08/24 1430   Allergies  Allergen Reactions   Dust Mite Extract Other (See Comments)   Social History   Socioeconomic History   Marital status: Married    Spouse name: Not on file   Number of children: Not on file   Years of education: Not on file   Highest education level: Bachelor's degree (e.g., BA, AB, BS)  Occupational History   Not on file  Tobacco Use   Smoking status: Former    Current packs/day: 0.00    Average packs/day: 2.0 packs/day for 11.0 years (22.0 ttl pk-yrs)    Types: Cigarettes    Start date: 12/20/1963    Quit date: 12/19/1974    Years since quitting: 49.5   Smokeless tobacco: Never  Vaping Use   Vaping status: Never Used  Substance and Sexual Activity   Alcohol use: Not Currently    Alcohol/week: 1.0 standard drink of alcohol   Drug use: No   Sexual activity: Not on file   Other Topics Concern   Not on file  Social History Narrative   Not on file   Social Drivers of Health   Financial Resource Strain: High Risk (06/08/2024)   Overall Financial Resource Strain (CARDIA)    Difficulty of Paying Living Expenses: Hard  Food Insecurity: No Food Insecurity (06/08/2024)   Hunger Vital Sign    Worried About Running Out of Food in the Last Year: Never  true    Ran Out of Food in the Last Year: Never true  Transportation Needs: No Transportation Needs (06/08/2024)   PRAPARE - Administrator, Civil Service (Medical): No    Lack of Transportation (Non-Medical): No  Physical Activity: Inactive (06/08/2024)   Exercise Vital Sign    Days of Exercise per Week: 0 days    Minutes of Exercise per Session: Not on file  Stress: No Stress Concern Present (06/08/2024)   [employer] of Occupational Health - Occupational Stress Questionnaire    Feeling of Stress: Not at all  Social Connections: Socially Integrated (06/08/2024)   Social Connection and Isolation Panel    Frequency of Communication with Friends and Family: More than three times a week    Frequency of Social Gatherings with Friends and Family: More than three times a week    Attends Religious Services: More than 4 times per year    Active Member of [employer] or Organizations: Yes    Attends Banker Meetings: More than 4 times per year    Marital Status: Married  Catering manager Violence: Not At Risk (05/08/2024)   Humiliation, Afraid, Rape, and Kick questionnaire    Fear of Current or Ex-Partner: No    Emotionally Abused: No    Physically Abused: No    Sexually Abused: No      Review of Systems  Respiratory:  Positive for cough.   All other systems reviewed and are negative.      Objective:   Physical Exam Vitals reviewed.  Constitutional:      Appearance: He is well-developed.  HENT:     Head: Normocephalic and atraumatic.     Nose: Nose normal.     Mouth/Throat:      Pharynx: No oropharyngeal exudate.  Neck:     Vascular: No carotid bruit.  Cardiovascular:     Rate and Rhythm: Normal rate. Rhythm irregular.     Heart sounds: Normal heart sounds.  Pulmonary:     Effort: Pulmonary effort is normal. No tachypnea or respiratory distress.     Breath sounds: No stridor or decreased air movement. Examination of the right-lower field reveals rhonchi and rales. Rhonchi and rales present. No wheezing.           Assessment & Plan:  Bronchiectasis with (acute) exacerbation (HCC) Lungs sound better today than 1 week ago.  However he continues to have purulent sputum and rhonchorous breath sounds with Rales in the right lung.  Switch antibiotics to Augmentin  and a Z-Pak to cover possible community-acquired pneumonia.  Meanwhile switch Symbicort  to Breztri  2 puffs twice daily.  My hope is that the glycopyrrolate will help decrease mucus production in the airways with its anticholinergic effects and help as a bronchodilator

## 2024-07-19 DIAGNOSIS — J45909 Unspecified asthma, uncomplicated: Secondary | ICD-10-CM | POA: Diagnosis not present

## 2024-07-19 DIAGNOSIS — J449 Chronic obstructive pulmonary disease, unspecified: Secondary | ICD-10-CM | POA: Diagnosis not present

## 2024-07-22 ENCOUNTER — Ambulatory Visit

## 2024-07-22 DIAGNOSIS — J4550 Severe persistent asthma, uncomplicated: Secondary | ICD-10-CM | POA: Diagnosis not present

## 2024-07-22 DIAGNOSIS — M7918 Myalgia, other site: Secondary | ICD-10-CM | POA: Diagnosis not present

## 2024-07-22 DIAGNOSIS — M546 Pain in thoracic spine: Secondary | ICD-10-CM | POA: Diagnosis not present

## 2024-07-22 DIAGNOSIS — S32010D Wedge compression fracture of first lumbar vertebra, subsequent encounter for fracture with routine healing: Secondary | ICD-10-CM | POA: Diagnosis not present

## 2024-07-23 NOTE — Telephone Encounter (Signed)
 SABRA

## 2024-07-26 DIAGNOSIS — M8008XA Age-related osteoporosis with current pathological fracture, vertebra(e), initial encounter for fracture: Secondary | ICD-10-CM | POA: Diagnosis not present

## 2024-07-26 DIAGNOSIS — J4540 Moderate persistent asthma, uncomplicated: Secondary | ICD-10-CM | POA: Diagnosis not present

## 2024-07-26 DIAGNOSIS — I480 Paroxysmal atrial fibrillation: Secondary | ICD-10-CM | POA: Diagnosis not present

## 2024-07-27 LAB — CBC
Hematocrit: 41.2 % (ref 37.5–51.0)
Hemoglobin: 12.6 g/dL — ABNORMAL LOW (ref 13.0–17.7)
MCH: 27.5 pg (ref 26.6–33.0)
MCHC: 30.6 g/dL — ABNORMAL LOW (ref 31.5–35.7)
MCV: 90 fL (ref 79–97)
Platelets: 339 x10E3/uL (ref 150–450)
RBC: 4.58 x10E6/uL (ref 4.14–5.80)
RDW: 15.9 % — ABNORMAL HIGH (ref 11.6–15.4)
WBC: 12.9 x10E3/uL — ABNORMAL HIGH (ref 3.4–10.8)

## 2024-07-27 LAB — BASIC METABOLIC PANEL WITH GFR
BUN/Creatinine Ratio: 42 — ABNORMAL HIGH (ref 10–24)
BUN: 33 mg/dL — ABNORMAL HIGH (ref 8–27)
CO2: 23 mmol/L (ref 20–29)
Calcium: 9.3 mg/dL (ref 8.6–10.2)
Chloride: 103 mmol/L (ref 96–106)
Creatinine, Ser: 0.78 mg/dL (ref 0.76–1.27)
Glucose: 104 mg/dL — ABNORMAL HIGH (ref 70–99)
Potassium: 4.5 mmol/L (ref 3.5–5.2)
Sodium: 140 mmol/L (ref 134–144)
eGFR: 91 mL/min/1.73 (ref >59)

## 2024-07-29 ENCOUNTER — Ambulatory Visit: Payer: Self-pay | Admitting: *Deleted

## 2024-07-30 ENCOUNTER — Encounter (HOSPITAL_COMMUNITY): Payer: Self-pay

## 2024-07-31 ENCOUNTER — Telehealth: Payer: Self-pay

## 2024-07-31 NOTE — Telephone Encounter (Signed)
 Called pt to discuss upcoming Ablation with Dr. Nancey. He was supposed to get clearance from Pulmonary MD first - his appt with that MD is 8/22. He was started on Dupixant but can not tell a difference yet, he has been taking for 2 mths but was told it could take up to 3 mths to notice a difference.   Pt doesn't think he is up to having Ablation at this time.   I have rescheduled him from 9/4 to 10/2 at 12:00. CT was rescheduled to 9/11/  We will be in touch with pt after he has visit with Pulmonary MD to make a decision at that time.

## 2024-08-01 ENCOUNTER — Ambulatory Visit (HOSPITAL_COMMUNITY)
Admission: RE | Admit: 2024-08-01 | Source: Ambulatory Visit | Attending: Cardiovascular Disease | Admitting: Cardiovascular Disease

## 2024-08-05 ENCOUNTER — Ambulatory Visit

## 2024-08-06 ENCOUNTER — Encounter: Payer: Self-pay | Admitting: *Deleted

## 2024-08-06 DIAGNOSIS — M546 Pain in thoracic spine: Secondary | ICD-10-CM | POA: Diagnosis not present

## 2024-08-06 DIAGNOSIS — M7918 Myalgia, other site: Secondary | ICD-10-CM | POA: Diagnosis not present

## 2024-08-06 DIAGNOSIS — S32010D Wedge compression fracture of first lumbar vertebra, subsequent encounter for fracture with routine healing: Secondary | ICD-10-CM | POA: Diagnosis not present

## 2024-08-06 DIAGNOSIS — M545 Low back pain, unspecified: Secondary | ICD-10-CM | POA: Diagnosis not present

## 2024-08-06 DIAGNOSIS — T85398D Other mechanical complication of other ocular prosthetic devices, implants and grafts, subsequent encounter: Secondary | ICD-10-CM | POA: Diagnosis not present

## 2024-08-06 DIAGNOSIS — H43391 Other vitreous opacities, right eye: Secondary | ICD-10-CM | POA: Diagnosis not present

## 2024-08-06 DIAGNOSIS — Z9889 Other specified postprocedural states: Secondary | ICD-10-CM | POA: Diagnosis not present

## 2024-08-06 DIAGNOSIS — M5459 Other low back pain: Secondary | ICD-10-CM | POA: Diagnosis not present

## 2024-08-06 DIAGNOSIS — H31091 Other chorioretinal scars, right eye: Secondary | ICD-10-CM | POA: Diagnosis not present

## 2024-08-09 ENCOUNTER — Encounter: Payer: Self-pay | Admitting: Pulmonary Disease

## 2024-08-09 ENCOUNTER — Other Ambulatory Visit (HOSPITAL_BASED_OUTPATIENT_CLINIC_OR_DEPARTMENT_OTHER): Payer: Self-pay | Admitting: Specialist

## 2024-08-09 ENCOUNTER — Ambulatory Visit: Admitting: Pulmonary Disease

## 2024-08-09 VITALS — BP 125/90 | HR 82 | Temp 97.8°F | Ht 69.0 in | Wt 210.0 lb

## 2024-08-09 DIAGNOSIS — J4489 Other specified chronic obstructive pulmonary disease: Secondary | ICD-10-CM

## 2024-08-09 DIAGNOSIS — Z87891 Personal history of nicotine dependence: Secondary | ICD-10-CM

## 2024-08-09 DIAGNOSIS — J45909 Unspecified asthma, uncomplicated: Secondary | ICD-10-CM | POA: Diagnosis not present

## 2024-08-09 DIAGNOSIS — J449 Chronic obstructive pulmonary disease, unspecified: Secondary | ICD-10-CM | POA: Diagnosis not present

## 2024-08-09 DIAGNOSIS — J479 Bronchiectasis, uncomplicated: Secondary | ICD-10-CM

## 2024-08-09 DIAGNOSIS — Z01811 Encounter for preprocedural respiratory examination: Secondary | ICD-10-CM | POA: Diagnosis not present

## 2024-08-09 DIAGNOSIS — S32010D Wedge compression fracture of first lumbar vertebra, subsequent encounter for fracture with routine healing: Secondary | ICD-10-CM

## 2024-08-09 NOTE — Patient Instructions (Addendum)
 Continue Dupixent  injections every 2 weeks  Continue Symbicort  2 puffs twice daily - rinse mouth out after each use  Continue levalbuterol  inhaler or nebulizer treatment as needed  We will update pulmonary function tests  Plan to schedule follow up CT as planned by Izetta Rouleau, NP  Follow up in 3 months, call sooner if needed

## 2024-08-09 NOTE — Progress Notes (Signed)
 Subjective:   PATIENT ID: Mario [name] GENDER: male DOB: May 17, 1946, MRN: [id number]   HPI Discussed the use of AI scribe software for clinical note transcription with the patient, who gave verbal consent to proceed.  History of Present Illness   Mario [name] is a 78 year old male with asthma-COPD overlap syndrome and bronchiectasis who presents for follow up.  He started Dupixent  a couple of months ago without significant improvement in symptoms. He has had four to five chest infections since April, with the last one in July. Daily symptoms include morning phlegm production and coughing, requiring an inhaler and sometimes a nebulizer if symptoms persist past 10 AM. Wheezing occurs daily but typically resolves by late morning. He has no current fevers, chills, or sweats.  He has been off prednisone  recently with no change in symptoms. He continues montelukast  daily and uses Symbicort , two puffs in the morning and evening. Other inhalers were less effective and more expensive. He performs flutter valve therapy at least once a day.  Financial concerns exist regarding the high copay for Dupixent , as he is on social security. He is working with the Allergy  office on his dupixent  prescription.   He is planning to undergo ablation procedure for atrial fibrillation in October.       Chief Complaint  Patient presents with   Consult    Severe asthma Pt reports frequent lung infections, but states right now he is pretty clear Pt has stopped dupixent  for now   Mario [name] is a 78 year old male, former smoker with atrial fibrillation, hypertension, CAD, chronic sinusitis, GERD, asthma-copd overlap syndrome and bronchiectasis who returns to pulmonary clinic.   He started dupixent  05/28/24. IgE 882. Aspergillus testing was negative.   Past Medical History:  Diagnosis Date   Arrhythmia    Arthritis    Asthma    Atrial fibrillation (HCC)    Biliary colic 2014   Bronchiectasis  (HCC)    2021   Cataract    COPD (chronic obstructive pulmonary disease) with chronic bronchitis (HCC)    Coronary artery calcification seen on CAT scan    Cyst of pancreas    1 cm (7/18), repeat ct in 12 months.     Hyperlipidemia    Hypertension    Obstructive sleep apnea    Renal disorder    Rhinitis    Sleep apnea    pt denies sleep apnea-lost weight     Family History  Problem Relation Age of Onset   Rheumatic fever Mother    Anemia Mother    Stroke Father    Atrial fibrillation Father    Colon polyps Father    Arrhythmia Father    Arthritis Sister    Asthma Maternal Uncle    Colon cancer Neg Hx    Esophageal cancer Neg Hx    Rectal cancer Neg Hx    Stomach cancer Neg Hx      Social History   Socioeconomic History   Marital status: Married    Spouse name: Not on file   Number of children: Not on file   Years of education: Not on file   Highest education level: Bachelor's degree (e.g., BA, AB, BS)  Occupational History   Not on file  Tobacco Use   Smoking status: Former    Current packs/day: 0.00    Average packs/day: 2.0 packs/day for 11.0 years (22.0 ttl pk-yrs)    Types: Cigarettes    Start date: 12/20/1963  Quit date: 12/19/1974    Years since quitting: 49.6   Smokeless tobacco: Never  Vaping Use   Vaping status: Never Used  Substance and Sexual Activity   Alcohol use: Not Currently    Alcohol/week: 1.0 standard drink of alcohol   Drug use: No   Sexual activity: Not on file  Other Topics Concern   Not on file  Social History Narrative   Not on file   Social Drivers of Health   Financial Resource Strain: High Risk (06/08/2024)   Overall Financial Resource Strain (CARDIA)    Difficulty of Paying Living Expenses: Hard  Food Insecurity: No Food Insecurity (06/08/2024)   Hunger Vital Sign    Worried About Running Out of Food in the Last Year: Never true    Ran Out of Food in the Last Year: Never true  Transportation Needs: No Transportation Needs  (06/08/2024)   PRAPARE - Administrator, Civil Service (Medical): No    Lack of Transportation (Non-Medical): No  Physical Activity: Inactive (06/08/2024)   Exercise Vital Sign    Days of Exercise per Week: 0 days    Minutes of Exercise per Session: Not on file  Stress: No Stress Concern Present (06/08/2024)   [employer] of Occupational Health - Occupational Stress Questionnaire    Feeling of Stress: Not at all  Social Connections: Socially Integrated (06/08/2024)   Social Connection and Isolation Panel    Frequency of Communication with Friends and Family: More than three times a week    Frequency of Social Gatherings with Friends and Family: More than three times a week    Attends Religious Services: More than 4 times per year    Active Member of [employer] or Organizations: Yes    Attends Engineer, structural: More than 4 times per year    Marital Status: Married  Catering manager Violence: Not At Risk (05/08/2024)   Humiliation, Afraid, Rape, and Kick questionnaire    Fear of Current or Ex-Partner: No    Emotionally Abused: No    Physically Abused: No    Sexually Abused: No     Allergies  Allergen Reactions   Dust Mite Extract Other (See Comments)     Outpatient Medications Prior to Visit  Medication Sig Dispense Refill   acetaminophen  (TYLENOL ) 500 MG tablet Take 1,000 mg by mouth every 6 (six) hours as needed for mild pain (pain score 1-3).     apixaban  (ELIQUIS ) 5 MG TABS tablet TAKE 1 TABLET BY MOUTH TWICE A DAY 60 tablet 5   Ascorbic Acid (VITAMIN C) 1000 MG tablet Take 1,000 mg by mouth 2 (two) times daily.     budesonide -formoterol  (SYMBICORT ) 160-4.5 MCG/ACT inhaler Inhale 2 puffs into the lungs in the morning and at bedtime. Generic medication 1 each 6   Cholecalciferol (VITAMIN D3) 125 MCG (5000 UT) TABS Take 1 tablet by mouth 2 (two) times daily.     Coenzyme Q10 (CO Q-10) 100 MG CAPS Take 1 capsule by mouth daily.     diltiazem  (DILACOR XR ) 240  MG 24 hr capsule Take 1 capsule (240 mg total) by mouth daily. 90 capsule 1   FIBER COMPLETE PO Take 1 tablet by mouth daily.     furosemide  (LASIX ) 40 MG tablet Take 1 tablet (40 mg total) by mouth daily. Take additional 20 mg if your weight is 3 lbs in 1 day of 5 lbs in 1 week 30 tablet 3   guaiFENesin  (MUCINEX ) 600 MG 12  hr tablet Take 1 tablet (600 mg total) by mouth 2 (two) times daily. 60 tablet 0   levalbuterol  (XOPENEX  HFA) 45 MCG/ACT inhaler Inhale 2 puffs into the lungs every 6 (six) hours as needed for wheezing. 1 each 12   levalbuterol  (XOPENEX ) 0.63 MG/3ML nebulizer solution Take 3 mLs (0.63 mg total) by nebulization 4 (four) times daily for 7 days, THEN 3 mLs (0.63 mg total) every 6 (six) hours as needed for wheezing or shortness of breath.     metoprolol  succinate (TOPROL -XL) 100 MG 24 hr tablet Take 1 tablet (100 mg total) by mouth daily. Take with or immediately following a meal. 90 tablet 3   montelukast  (SINGULAIR ) 10 MG tablet TAKE 1 TABLET BY MOUTH EVERY DAY 90 tablet 2   Olopatadine-Mometasone  (RYALTRIS ) 665-25 MCG/ACT SUSP 2 sprays each nostril 2 times per day 29 g 3   pantoprazole  (PROTONIX ) 40 MG tablet TAKE 1 TABLET BY MOUTH TWICE A DAY 180 tablet 2   Respiratory Therapy Supplies (FLUTTER) DEVI Use as directed 1 each 0   rosuvastatin  (CRESTOR ) 20 MG tablet TAKE 1 TABLET BY MOUTH EVERY DAY **D/C LIPITOR** (Patient taking differently: Take 20 mg by mouth every evening.) 90 tablet 2   Dupilumab  (DUPIXENT ) 100 MG/0.67ML SOSY Inject into the skin. (Patient not taking: Reported on 08/09/2024)     amoxicillin -clavulanate (AUGMENTIN ) 875-125 MG tablet Take 1 tablet by mouth 2 (two) times daily. 20 tablet 0   azithromycin  (ZITHROMAX ) 250 MG tablet 2 tabs poqday1, 1 tab poqday 2-5 6 tablet 0   benzonatate  (TESSALON ) 200 MG capsule Take 1 capsule (200 mg total) by mouth 3 (three) times daily as needed for cough. 30 capsule 1   predniSONE  (DELTASONE ) 10 MG tablet Take 1 tablet (10 mg  total) by mouth daily with breakfast. (Patient not taking: Reported on 08/09/2024) 30 tablet 3   predniSONE  (DELTASONE ) 20 MG tablet Take 3 tablets (60 mg total) by mouth daily with breakfast. 30 tablet 0   Facility-Administered Medications Prior to Visit  Medication Dose Route Frequency Provider Last Rate Last Admin   dupilumab  (DUPIXENT ) prefilled syringe 300 mg  300 mg Subcutaneous Q14 Days Kozlow, Eric J, MD   300 mg at 07/22/24 1502   Review of Systems  Constitutional:  Negative for chills, fever, malaise/fatigue and weight loss.  HENT:  Negative for congestion, sinus pain and sore throat.   Eyes: Negative.   Respiratory:  Positive for cough, shortness of breath and wheezing. Negative for hemoptysis and sputum production.   Cardiovascular:  Negative for chest pain, palpitations, orthopnea, claudication and leg swelling.  Gastrointestinal:  Negative for abdominal pain, heartburn, nausea and vomiting.  Genitourinary: Negative.   Musculoskeletal:  Negative for joint pain and myalgias.  Skin:  Negative for rash.  Neurological:  Negative for weakness.  Endo/Heme/Allergies: Negative.   Psychiatric/Behavioral: Negative.     Objective:   Vitals:   08/09/24 0953  BP: (!) 125/90  Pulse: 82  Temp: 97.8 F (36.6 C)  SpO2: 92%  Weight: 210 lb (95.3 kg)  Height: 5' 9 (1.753 m)    Physical Exam Constitutional:      General: He is not in acute distress.    Appearance: Normal appearance.  Eyes:     General: No scleral icterus.    Conjunctiva/sclera: Conjunctivae normal.  Cardiovascular:     Rate and Rhythm: Normal rate and regular rhythm.  Pulmonary:     Breath sounds: Wheezing (mild, left lower lobe) present. No rhonchi or rales.  Musculoskeletal:     Right lower leg: No edema.     Left lower leg: No edema.  Skin:    General: Skin is warm and dry.  Neurological:     General: No focal deficit present.    CBC    Component Value Date/Time   WBC 12.9 (H) 07/26/2024 1109    WBC 14.7 (H) 05/07/2024 1652   RBC 4.58 07/26/2024 1109   RBC 4.83 05/07/2024 1652   HGB 12.6 (L) 07/26/2024 1109   HCT 41.2 07/26/2024 1109   PLT 339 07/26/2024 1109   MCV 90 07/26/2024 1109   MCH 27.5 07/26/2024 1109   MCH 28.6 05/07/2024 1652   MCHC 30.6 (L) 07/26/2024 1109   MCHC 32.1 05/07/2024 1652   RDW 15.9 (H) 07/26/2024 1109   LYMPHSABS 0.6 (L) 05/28/2024 1157   MONOABS 1.3 (H) 09/16/2019 0341   EOSABS 0.0 05/28/2024 1157   BASOSABS 0.0 05/28/2024 1157      Latest Ref Rng & Units 07/26/2024   11:10 AM 05/07/2024    4:52 PM 04/26/2024   10:22 AM  BMP  Glucose 70 - 99 mg/dL 895  893  96   BUN 8 - 27 mg/dL 33  43  33   Creatinine 0.76 - 1.27 mg/dL 9.21  8.83  8.92   BUN/Creat Ratio 10 - 24 42  37  31   Sodium 134 - 144 mmol/L 140  140  139   Potassium 3.5 - 5.2 mmol/L 4.5  4.9  4.5   Chloride 96 - 106 mmol/L 103  104  101   CO2 20 - 29 mmol/L 23  29  32   Calcium  8.6 - 10.2 mg/dL 9.3  9.3  9.3    Chest imaging: CT Chest 05/25/24 1. New and likely acute/subacute T12 compression fracture with approximately 55% height loss and mild (2 mm) posterior retropulsion when compared with prior imaging from 12/05/2023. 2. Slight interval progression of diffuse bronchial wall thickening and developing bronchiectasis with associated volume loss/atelectasis and pleuroparenchymal scarring in the bilateral lower lobes with similar but less conspicuous findings also developing within the inferior aspect of the right middle lobe and lingula. Findings suggest progressive chronic bronchitis. 3. No evidence of active pneumonia or new pulmonary mass or nodule. 4. Aortic and coronary artery atherosclerotic vascular calcifications.  PFT:    Latest Ref Rng & Units 08/13/2018   10:52 AM  PFT Results  FVC-Pre L 2.99   FVC-Predicted Pre % 73   Pre FEV1/FVC % % 62   FEV1-Pre L 1.85   FEV1-Predicted Pre % 63   DLCO uncorrected ml/min/mmHg 23.00   DLCO UNC% % 75   DLCO corrected  ml/min/mmHg 22.69   DLCO COR %Predicted % 74   DLVA Predicted % 108   TLC L 5.95   TLC % Predicted % 88   RV % Predicted % 120     Labs:  Path:  Echo:  Heart Catheterization:    Assessment & Plan:   Asthma-COPD overlap syndrome (HCC) - Plan: Pulmonary Function Test Assessment and Plan    Asthma with frequent exacerbations and bronchiectasis Chronic asthma with frequent exacerbations and bronchiectasis. Recent infections and daily symptoms persist. Dupixent  started with potential benefits in reduced prednisone  use. Awaiting financial support for Dupixent  copay. CT scan planned for bronchiectasis follow-up. - Continue Dupixent  for six months to assess efficacy. - Increase flutter valve therapy to twice daily after nebulizer treatments - Complete CT scan in October for bronchiectasis follow-up. -  Update breathing tests at next visit in three months. - Coordinate with pharmacy for Dupixent  copay support. - Continue symbicort  2 puffs twice daily an das needed albuterol   Pre-Op Respiratory Evaluation for upcoming Cardiac Ablation Procedure ARISCAT Score for Postoperative Pulmonary Complications - Low to Intermediate risk - 1.6%-13.3% risk of in-hospital post-op pulmonary complications (composite including respiratory failure, respiratory infection, pleural effusion, atelectasis, pneumothorax, bronchospasm, aspiration pneumonitis)  Follow up in 3 months  Dorn Chill, MD [facility] Office: [phone]   Current Outpatient Medications:    acetaminophen  (TYLENOL ) 500 MG tablet, Take 1,000 mg by mouth every 6 (six) hours as needed for mild pain (pain score 1-3)., Disp: , Rfl:    apixaban  (ELIQUIS ) 5 MG TABS tablet, TAKE 1 TABLET BY MOUTH TWICE A DAY, Disp: 60 tablet, Rfl: 5   Ascorbic Acid (VITAMIN C) 1000 MG tablet, Take 1,000 mg by mouth 2 (two) times daily., Disp: , Rfl:    budesonide -formoterol  (SYMBICORT ) 160-4.5 MCG/ACT inhaler, Inhale 2 puffs into  the lungs in the morning and at bedtime. Generic medication, Disp: 1 each, Rfl: 6   Cholecalciferol (VITAMIN D3) 125 MCG (5000 UT) TABS, Take 1 tablet by mouth 2 (two) times daily., Disp: , Rfl:    Coenzyme Q10 (CO Q-10) 100 MG CAPS, Take 1 capsule by mouth daily., Disp: , Rfl:    diltiazem  (DILACOR XR ) 240 MG 24 hr capsule, Take 1 capsule (240 mg total) by mouth daily., Disp: 90 capsule, Rfl: 1   FIBER COMPLETE PO, Take 1 tablet by mouth daily., Disp: , Rfl:    furosemide  (LASIX ) 40 MG tablet, Take 1 tablet (40 mg total) by mouth daily. Take additional 20 mg if your weight is 3 lbs in 1 day of 5 lbs in 1 week, Disp: 30 tablet, Rfl: 3   guaiFENesin  (MUCINEX ) 600 MG 12 hr tablet, Take 1 tablet (600 mg total) by mouth 2 (two) times daily., Disp: 60 tablet, Rfl: 0   levalbuterol  (XOPENEX  HFA) 45 MCG/ACT inhaler, Inhale 2 puffs into the lungs every 6 (six) hours as needed for wheezing., Disp: 1 each, Rfl: 12   levalbuterol  (XOPENEX ) 0.63 MG/3ML nebulizer solution, Take 3 mLs (0.63 mg total) by nebulization 4 (four) times daily for 7 days, THEN 3 mLs (0.63 mg total) every 6 (six) hours as needed for wheezing or shortness of breath., Disp: , Rfl:    metoprolol  succinate (TOPROL -XL) 100 MG 24 hr tablet, Take 1 tablet (100 mg total) by mouth daily. Take with or immediately following a meal., Disp: 90 tablet, Rfl: 3   montelukast  (SINGULAIR ) 10 MG tablet, TAKE 1 TABLET BY MOUTH EVERY DAY, Disp: 90 tablet, Rfl: 2   Olopatadine-Mometasone  (RYALTRIS ) 665-25 MCG/ACT SUSP, 2 sprays each nostril 2 times per day, Disp: 29 g, Rfl: 3   pantoprazole  (PROTONIX ) 40 MG tablet, TAKE 1 TABLET BY MOUTH TWICE A DAY, Disp: 180 tablet, Rfl: 2   Respiratory Therapy Supplies (FLUTTER) DEVI, Use as directed, Disp: 1 each, Rfl: 0   rosuvastatin  (CRESTOR ) 20 MG tablet, TAKE 1 TABLET BY MOUTH EVERY DAY **D/C LIPITOR** (Patient taking differently: Take 20 mg by mouth every evening.), Disp: 90 tablet, Rfl: 2   Dupilumab  (DUPIXENT ) 100  MG/0.67ML SOSY, Inject into the skin. (Patient not taking: Reported on 08/09/2024), Disp: , Rfl:   Current Facility-Administered Medications:    dupilumab  (DUPIXENT ) prefilled syringe 300 mg, 300 mg, Subcutaneous, Q14 Days, Kozlow, Eric J, MD, 300 mg at 07/22/24 1502

## 2024-08-19 DIAGNOSIS — J449 Chronic obstructive pulmonary disease, unspecified: Secondary | ICD-10-CM | POA: Diagnosis not present

## 2024-08-19 DIAGNOSIS — J45909 Unspecified asthma, uncomplicated: Secondary | ICD-10-CM | POA: Diagnosis not present

## 2024-08-22 ENCOUNTER — Telehealth (HOSPITAL_COMMUNITY): Payer: Self-pay

## 2024-08-22 ENCOUNTER — Encounter (HOSPITAL_COMMUNITY): Payer: Self-pay

## 2024-08-22 DIAGNOSIS — Z01812 Encounter for preprocedural laboratory examination: Secondary | ICD-10-CM

## 2024-08-22 DIAGNOSIS — I4819 Other persistent atrial fibrillation: Secondary | ICD-10-CM

## 2024-08-22 NOTE — Telephone Encounter (Signed)
 Spoke with patient to complete pre-procedure call.     Health status review:  Any new medical conditions, recent signs of acute illness or been started on antibiotics? No. Patient denies any acute asthma symptoms at this time.  Any recent hospitalizations or surgeries? No Any new medications started since pre-op visit? No  Follow all medication instructions prior to procedure or the procedure may be rescheduled:    Continue taking Eliquis  (Apixaban ) twice daily without missing any doses before procedure. Essential chronic medications:  No medication should be continued, unless told otherwise. On the morning of your procedure DO NOT take any medication., including Eliquis  (Apixaban ).  Nothing to eat or drink after midnight prior to your procedure.  Pre-procedure testing scheduled: CT on September 9 and lab work on September 9.  Confirmed patient is scheduled for Atrial Fibrillation Ablation on Thursday, October 2 with Dr. Eulas Furbish. Instructed patient to arrive at the Main Entrance A at [facility]: [address] [place], [place] [address] and check in at Admitting at 10:00 AM.  Advised of plan to go home the same day and will only stay overnight if medically necessary. You MUST have a responsible adult to drive you home and MUST be with you the first 24 hours after you arrive home or your procedure could be cancelled.  Informed patient a nurse will call a day before the procedure to confirm arrival time and ensure instructions are followed.  Patient verbalized understanding to information provided and is agreeable to proceed with procedure.   Advised patient to contact RN Navigator at [phone], to inform of any new medications started after call or concerns prior to procedure.

## 2024-08-23 ENCOUNTER — Other Ambulatory Visit: Payer: Self-pay | Admitting: Cardiovascular Disease

## 2024-08-23 DIAGNOSIS — I480 Paroxysmal atrial fibrillation: Secondary | ICD-10-CM

## 2024-08-23 NOTE — Telephone Encounter (Signed)
 Prescription refill request for Eliquis  received. Indication: a fib Last office visit: 05/30/24 Scr: 0.78 epic 07/26/24 Age: 78 Weight: 95kg

## 2024-08-26 DIAGNOSIS — J4540 Moderate persistent asthma, uncomplicated: Secondary | ICD-10-CM | POA: Diagnosis not present

## 2024-08-26 DIAGNOSIS — M8008XA Age-related osteoporosis with current pathological fracture, vertebra(e), initial encounter for fracture: Secondary | ICD-10-CM | POA: Diagnosis not present

## 2024-08-27 ENCOUNTER — Ambulatory Visit (HOSPITAL_COMMUNITY)
Admission: RE | Admit: 2024-08-27 | Discharge: 2024-08-27 | Disposition: A | Discharge status: Home / Self Care | Source: Ambulatory Visit | Attending: Cardiovascular Disease | Admitting: Cardiovascular Disease

## 2024-08-27 DIAGNOSIS — I2510 Atherosclerotic heart disease of native coronary artery without angina pectoris: Secondary | ICD-10-CM | POA: Diagnosis not present

## 2024-08-27 DIAGNOSIS — I480 Paroxysmal atrial fibrillation: Secondary | ICD-10-CM | POA: Insufficient documentation

## 2024-08-27 DIAGNOSIS — I4819 Other persistent atrial fibrillation: Secondary | ICD-10-CM | POA: Diagnosis not present

## 2024-08-27 DIAGNOSIS — K449 Diaphragmatic hernia without obstruction or gangrene: Secondary | ICD-10-CM | POA: Diagnosis not present

## 2024-08-27 DIAGNOSIS — Z01812 Encounter for preprocedural laboratory examination: Secondary | ICD-10-CM | POA: Diagnosis not present

## 2024-08-27 MED ORDER — IOHEXOL 350 MG/ML SOLN
100.0000 mL | Freq: Once | INTRAVENOUS | Status: AC | PRN
  Administered 2024-08-27: 100 mL via INTRAVENOUS

## 2024-08-28 ENCOUNTER — Ambulatory Visit (HOSPITAL_BASED_OUTPATIENT_CLINIC_OR_DEPARTMENT_OTHER)
Admission: RE | Admit: 2024-08-28 | Discharge: 2024-08-28 | Disposition: A | Discharge status: Home / Self Care | Source: Ambulatory Visit | Attending: Nurse Practitioner | Admitting: Nurse Practitioner

## 2024-08-28 ENCOUNTER — Ambulatory Visit (HOSPITAL_BASED_OUTPATIENT_CLINIC_OR_DEPARTMENT_OTHER)
Admission: RE | Admit: 2024-08-28 | Discharge: 2024-08-28 | Disposition: A | Discharge status: Home / Self Care | Source: Ambulatory Visit | Attending: Specialist | Admitting: Specialist

## 2024-08-28 DIAGNOSIS — S32010D Wedge compression fracture of first lumbar vertebra, subsequent encounter for fracture with routine healing: Secondary | ICD-10-CM

## 2024-08-28 DIAGNOSIS — S22078A Other fracture of T9-T10 vertebra, initial encounter for closed fracture: Secondary | ICD-10-CM | POA: Insufficient documentation

## 2024-08-28 DIAGNOSIS — X58XXXA Exposure to other specified factors, initial encounter: Secondary | ICD-10-CM | POA: Diagnosis not present

## 2024-08-28 DIAGNOSIS — J849 Interstitial pulmonary disease, unspecified: Secondary | ICD-10-CM | POA: Insufficient documentation

## 2024-08-28 DIAGNOSIS — S22068A Other fracture of T7-T8 thoracic vertebra, initial encounter for closed fracture: Secondary | ICD-10-CM | POA: Insufficient documentation

## 2024-08-28 DIAGNOSIS — J479 Bronchiectasis, uncomplicated: Secondary | ICD-10-CM | POA: Diagnosis not present

## 2024-08-28 DIAGNOSIS — J4550 Severe persistent asthma, uncomplicated: Secondary | ICD-10-CM | POA: Diagnosis not present

## 2024-08-28 DIAGNOSIS — S32018A Other fracture of first lumbar vertebra, initial encounter for closed fracture: Secondary | ICD-10-CM | POA: Insufficient documentation

## 2024-08-28 DIAGNOSIS — M8589 Other specified disorders of bone density and structure, multiple sites: Secondary | ICD-10-CM | POA: Insufficient documentation

## 2024-08-28 DIAGNOSIS — K449 Diaphragmatic hernia without obstruction or gangrene: Secondary | ICD-10-CM | POA: Diagnosis not present

## 2024-08-28 DIAGNOSIS — S22088D Other fracture of T11-T12 vertebra, subsequent encounter for fracture with routine healing: Secondary | ICD-10-CM | POA: Insufficient documentation

## 2024-08-28 DIAGNOSIS — Z13820 Encounter for screening for osteoporosis: Secondary | ICD-10-CM | POA: Insufficient documentation

## 2024-08-28 LAB — CBC
Hematocrit: 40.8 % (ref 37.5–51.0)
Hemoglobin: 12.3 g/dL — ABNORMAL LOW (ref 13.0–17.7)
MCH: 26.3 pg — ABNORMAL LOW (ref 26.6–33.0)
MCHC: 30.1 g/dL — ABNORMAL LOW (ref 31.5–35.7)
MCV: 87 fL (ref 79–97)
Platelets: 398 x10E3/uL (ref 150–450)
RBC: 4.68 x10E6/uL (ref 4.14–5.80)
RDW: 15.7 % — ABNORMAL HIGH (ref 11.6–15.4)
WBC: 12.1 x10E3/uL — ABNORMAL HIGH (ref 3.4–10.8)

## 2024-08-28 LAB — BASIC METABOLIC PANEL WITH GFR
BUN/Creatinine Ratio: 37 — ABNORMAL HIGH (ref 10–24)
BUN: 31 mg/dL — ABNORMAL HIGH (ref 8–27)
CO2: 18 mmol/L — ABNORMAL LOW (ref 20–29)
Calcium: 9 mg/dL (ref 8.6–10.2)
Chloride: 101 mmol/L (ref 96–106)
Creatinine, Ser: 0.83 mg/dL (ref 0.76–1.27)
Glucose: 91 mg/dL (ref 70–99)
Potassium: 4 mmol/L (ref 3.5–5.2)
Sodium: 138 mmol/L (ref 134–144)
eGFR: 89 mL/min/1.73 (ref >59)

## 2024-08-29 ENCOUNTER — Other Ambulatory Visit (HOSPITAL_COMMUNITY)

## 2024-08-29 ENCOUNTER — Ambulatory Visit: Payer: Self-pay

## 2024-09-03 ENCOUNTER — Encounter: Payer: Self-pay | Admitting: Family Medicine

## 2024-09-03 ENCOUNTER — Ambulatory Visit (INDEPENDENT_AMBULATORY_CARE_PROVIDER_SITE_OTHER): Admitting: Family Medicine

## 2024-09-03 VITALS — BP 126/72 | HR 94 | Temp 97.7°F | Ht 69.0 in | Wt 209.0 lb

## 2024-09-03 DIAGNOSIS — M8580 Other specified disorders of bone density and structure, unspecified site: Secondary | ICD-10-CM | POA: Diagnosis not present

## 2024-09-03 MED ORDER — PREDNISONE 5 MG PO TABS
5.0000 mg | ORAL_TABLET | Freq: Every day | ORAL | 1 refills | Status: DC
Start: 2024-09-03 — End: 2024-10-17

## 2024-09-03 NOTE — Progress Notes (Signed)
 Subjective:    Patient ID: Mario [name], male    DOB: 03/31/1946, 78 y.o.   MRN: [id number] 09/03/24 Patient recently had a bone density test due to his unprovoked vertebral fractures.  The results are included below: LUMBAR SPINE (L2-L3):   BMD (in g/cm2): 1.188   T-score: -0.5   Z-score: -0.4   LEFT FEMORAL NECK:   BMD (in g/cm2): 0.880   T-score: -1.5   Z-score: -0.3   LEFT TOTAL HIP:   BMD (in g/cm2): 0.918   T-score: -1.3   Z-score: -0.5   RIGHT FEMORAL NECK:   BMD (in g/cm2): 0.908   T-score: -1.2   Z-score: -0.1   RIGHT TOTAL HIP:   BMD (in g/cm2): 0.937   T-score: -1.1   Z-score: -0.4   FRAX 10-YEAR PROBABILITY OF FRACTURE:   Based on the World Health Organization FRAX model, the 10 year probability of a major osteoporotic fracture is 10%. The 10 year probability of a hip fracture is 3%. We discussed the results included above.  He has osteopenia.  The question is whether he would benefit from taking Fosamax .  We discussed the potential risk of stomach toxicity from Fosamax .  He is hesitant to want to take any medication because of the number of medicines he is already having to take.  He would like to try calcium  and vitamin D.  He is also scheduled for an upcoming ablation due to atrial fibrillation. Past Medical History:  Diagnosis Date   Arrhythmia    Arthritis    Asthma    Atrial fibrillation (HCC)    Biliary colic 2014   Bronchiectasis (HCC)    2021   Cataract    COPD (chronic obstructive pulmonary disease) with chronic bronchitis (HCC)    Coronary artery calcification seen on CAT scan    Cyst of pancreas    1 cm (7/18), repeat ct in 12 months.     Hyperlipidemia    Hypertension    Obstructive sleep apnea    Renal disorder    Rhinitis    Sleep apnea    pt denies sleep apnea-lost weight   Past Surgical History:  Procedure Laterality Date   CARDIOVERSION N/A 05/14/2024   Procedure: CARDIOVERSION;  Surgeon: Michele Richardson, DO;   Location: [facility];  Service: Cardiovascular;  Laterality: N/A;   CHOLECYSTECTOMY     COLONOSCOPY  09/29/2014   INCISION AND DRAINAGE OF WOUND Left 03/13/2024   Procedure: IRRIGATION AND DEBRIDEMENT WOUND;  Surgeon: Shari Easter, MD;  Location: [facility];  Service: Orthopedics;  Laterality: Left;  IRRIGATION AND DEBRIDEMENT FIFTH FINGER LEFT   LAPAROSCOPIC CHOLECYSTECTOMY SINGLE SITE WITH INTRAOPERATIVE CHOLANGIOGRAM N/A 09/15/2019   Procedure: LAPAROSCOPIC CHOLECYSTECTOMY SINGLE SITE;  Surgeon: Sheldon Standing, MD;  Location: [facility];  Service: General;  Laterality: N/A;   LITHOTRIPSY  2000   for kidney stone   Current Outpatient Medications on File Prior to Visit  Medication Sig Dispense Refill   acetaminophen  (TYLENOL ) 500 MG tablet Take 1,000 mg by mouth every 6 (six) hours as needed for mild pain (pain score 1-3).     Ascorbic Acid (VITAMIN C) 1000 MG tablet Take 1,000 mg by mouth 2 (two) times daily.     budesonide -formoterol  (SYMBICORT ) 160-4.5 MCG/ACT inhaler Inhale 2 puffs into the lungs in the morning and at bedtime. Generic medication 1 each 6   Cholecalciferol (VITAMIN D3) 125 MCG (5000 UT) TABS Take 1 tablet by mouth 2 (two) times daily.  Coenzyme Q10 (CO Q-10) 100 MG CAPS Take 1 capsule by mouth daily.     diltiazem  (DILACOR XR ) 240 MG 24 hr capsule Take 1 capsule (240 mg total) by mouth daily. 90 capsule 1   ELIQUIS  5 MG TABS tablet TAKE 1 TABLET BY MOUTH TWICE A DAY 60 tablet 5   FIBER COMPLETE PO Take 1 tablet by mouth daily.     furosemide  (LASIX ) 40 MG tablet Take 1 tablet (40 mg total) by mouth daily. Take additional 20 mg if your weight is 3 lbs in 1 day of 5 lbs in 1 week 30 tablet 3   guaiFENesin  (MUCINEX ) 600 MG 12 hr tablet Take 1 tablet (600 mg total) by mouth 2 (two) times daily. 60 tablet 0   levalbuterol  (XOPENEX  HFA) 45 MCG/ACT inhaler Inhale 2 puffs into the lungs every 6 (six) hours as needed for wheezing. 1 each 12   metoprolol  succinate (TOPROL -XL) 100  MG 24 hr tablet Take 1 tablet (100 mg total) by mouth daily. Take with or immediately following a meal. 90 tablet 3   montelukast  (SINGULAIR ) 10 MG tablet TAKE 1 TABLET BY MOUTH EVERY DAY 90 tablet 2   Olopatadine-Mometasone  (RYALTRIS ) 665-25 MCG/ACT SUSP 2 sprays each nostril 2 times per day 29 g 3   pantoprazole  (PROTONIX ) 40 MG tablet TAKE 1 TABLET BY MOUTH TWICE A DAY 180 tablet 2   Respiratory Therapy Supplies (FLUTTER) DEVI Use as directed 1 each 0   rosuvastatin  (CRESTOR ) 20 MG tablet TAKE 1 TABLET BY MOUTH EVERY DAY **D/C LIPITOR** (Patient taking differently: Take 20 mg by mouth every evening.) 90 tablet 2   Dupilumab  (DUPIXENT ) 100 MG/0.67ML SOSY Inject into the skin. (Patient not taking: Reported on 09/03/2024)     levalbuterol  (XOPENEX ) 0.63 MG/3ML nebulizer solution Take 3 mLs (0.63 mg total) by nebulization 4 (four) times daily for 7 days, THEN 3 mLs (0.63 mg total) every 6 (six) hours as needed for wheezing or shortness of breath.     Current Facility-Administered Medications on File Prior to Visit  Medication Dose Route Frequency Provider Last Rate Last Admin   dupilumab  (DUPIXENT ) prefilled syringe 300 mg  300 mg Subcutaneous Q14 Days Kozlow, Eric J, MD   300 mg at 07/22/24 1502   Allergies  Allergen Reactions   Dust Mite Extract Other (See Comments)   Social History   Socioeconomic History   Marital status: Married    Spouse name: Not on file   Number of children: Not on file   Years of education: Not on file   Highest education level: Bachelor's degree (e.g., BA, AB, BS)  Occupational History   Not on file  Tobacco Use   Smoking status: Former    Current packs/day: 0.00    Average packs/day: 2.0 packs/day for 11.0 years (22.0 ttl pk-yrs)    Types: Cigarettes    Start date: 12/20/1963    Quit date: 12/19/1974    Years since quitting: 49.7   Smokeless tobacco: Never  Vaping Use   Vaping status: Never Used  Substance and Sexual Activity   Alcohol use: Not Currently     Alcohol/week: 1.0 standard drink of alcohol   Drug use: No   Sexual activity: Not on file  Other Topics Concern   Not on file  Social History Narrative   Not on file   Social Drivers of Health   Financial Resource Strain: High Risk (06/08/2024)   Overall Financial Resource Strain (CARDIA)    Difficulty of Paying  Living Expenses: Hard  Food Insecurity: No Food Insecurity (06/08/2024)   Hunger Vital Sign    Worried About Running Out of Food in the Last Year: Never true    Ran Out of Food in the Last Year: Never true  Transportation Needs: No Transportation Needs (06/08/2024)   PRAPARE - Administrator, Civil Service (Medical): No    Lack of Transportation (Non-Medical): No  Physical Activity: Inactive (06/08/2024)   Exercise Vital Sign    Days of Exercise per Week: 0 days    Minutes of Exercise per Session: Not on file  Stress: No Stress Concern Present (06/08/2024)   [employer] of Occupational Health - Occupational Stress Questionnaire    Feeling of Stress: Not at all  Social Connections: Socially Integrated (06/08/2024)   Social Connection and Isolation Panel    Frequency of Communication with Friends and Family: More than three times a week    Frequency of Social Gatherings with Friends and Family: More than three times a week    Attends Religious Services: More than 4 times per year    Active Member of [employer] or Organizations: Yes    Attends Banker Meetings: More than 4 times per year    Marital Status: Married  Catering manager Violence: Not At Risk (05/08/2024)   Humiliation, Afraid, Rape, and Kick questionnaire    Fear of Current or Ex-Partner: No    Emotionally Abused: No    Physically Abused: No    Sexually Abused: No      Review of Systems  Respiratory:  Positive for cough.   All other systems reviewed and are negative.      Objective:   Physical Exam Vitals reviewed.  Constitutional:      Appearance: He is well-developed.   HENT:     Head: Normocephalic and atraumatic.     Nose: Nose normal.     Mouth/Throat:     Pharynx: No oropharyngeal exudate.  Neck:     Vascular: No carotid bruit.  Cardiovascular:     Rate and Rhythm: Normal rate. Rhythm irregular.     Heart sounds: Normal heart sounds.  Pulmonary:     Effort: Pulmonary effort is normal. No tachypnea or respiratory distress.     Breath sounds: No stridor or decreased air movement. Examination of the right-lower field reveals rhonchi and rales. Rhonchi and rales present. No wheezing.           Assessment & Plan:  Osteopenia, unspecified location Patient has osteopenia on his bone density test.  By definition he qualifies as osteoporosis due to unprovoked vertebral fractures.  However, he is hesitant to want to take additional medication especially with potential side effects.  He is elected not to start Fosamax  which I agree with.  He would like to do calcium  and vitamin D.  I believe the biggest risk of this patient's bone health is the repeated use of steroids.  I encouraged him to try to get away from steroids is much as possible.  We will wean the patient down to 5 mg of prednisone  a day

## 2024-09-05 ENCOUNTER — Encounter: Payer: Self-pay | Admitting: Family Medicine

## 2024-09-06 ENCOUNTER — Other Ambulatory Visit: Payer: Self-pay | Admitting: Specialist

## 2024-09-06 ENCOUNTER — Other Ambulatory Visit: Payer: Self-pay | Admitting: Family Medicine

## 2024-09-06 DIAGNOSIS — S32010G Wedge compression fracture of first lumbar vertebra, subsequent encounter for fracture with delayed healing: Secondary | ICD-10-CM

## 2024-09-06 DIAGNOSIS — S22000G Wedge compression fracture of unspecified thoracic vertebra, subsequent encounter for fracture with delayed healing: Secondary | ICD-10-CM

## 2024-09-06 MED ORDER — IRON (FERROUS SULFATE) 325 (65 FE) MG PO TABS: 325.0000 mg | ORAL_TABLET | Freq: Every day | ORAL | 11 refills | Status: AC

## 2024-09-06 MED ORDER — ALENDRONATE SODIUM 70 MG PO TABS: 70.0000 mg | ORAL_TABLET | ORAL | 11 refills | Status: AC

## 2024-09-09 ENCOUNTER — Other Ambulatory Visit: Payer: Self-pay | Admitting: Specialist

## 2024-09-09 DIAGNOSIS — M546 Pain in thoracic spine: Secondary | ICD-10-CM

## 2024-09-10 ENCOUNTER — Other Ambulatory Visit: Payer: Self-pay | Admitting: Nurse Practitioner

## 2024-09-10 ENCOUNTER — Ambulatory Visit: Payer: Self-pay | Admitting: Nurse Practitioner

## 2024-09-10 DIAGNOSIS — J4550 Severe persistent asthma, uncomplicated: Secondary | ICD-10-CM

## 2024-09-10 NOTE — Progress Notes (Signed)
 Relatively stable CT chest findings with btx and volume loss. Stable 3 mm nodule, considered benign. No LA. Atherosclerosis, stable. New compression deformities, not identified as fractures, at thoracic spine T7, T10, L1. Follow up with PCP regarding these if having pain. Thanks.

## 2024-09-12 ENCOUNTER — Other Ambulatory Visit

## 2024-09-12 ENCOUNTER — Ambulatory Visit
Admission: RE | Admit: 2024-09-12 | Discharge: 2024-09-12 | Disposition: A | Discharge status: Home / Self Care | Source: Ambulatory Visit | Attending: Specialist | Admitting: Specialist

## 2024-09-12 ENCOUNTER — Telehealth: Payer: Self-pay | Admitting: *Deleted

## 2024-09-12 DIAGNOSIS — S32010A Wedge compression fracture of first lumbar vertebra, initial encounter for closed fracture: Secondary | ICD-10-CM | POA: Diagnosis not present

## 2024-09-12 DIAGNOSIS — S22070A Wedge compression fracture of T9-T10 vertebra, initial encounter for closed fracture: Secondary | ICD-10-CM | POA: Diagnosis not present

## 2024-09-12 DIAGNOSIS — S22060A Wedge compression fracture of T7-T8 vertebra, initial encounter for closed fracture: Secondary | ICD-10-CM | POA: Diagnosis not present

## 2024-09-12 DIAGNOSIS — S32010G Wedge compression fracture of first lumbar vertebra, subsequent encounter for fracture with delayed healing: Secondary | ICD-10-CM

## 2024-09-12 DIAGNOSIS — S22000G Wedge compression fracture of unspecified thoracic vertebra, subsequent encounter for fracture with delayed healing: Secondary | ICD-10-CM

## 2024-09-12 HISTORY — PX: IR RADIOLOGIST EVAL & MGMT: IMG5224

## 2024-09-12 NOTE — Consult Note (Signed)
 Chief Complaint: Patient was seen in consultation today for T7, T10, L1 compression fractures  Referring Physician(s): Beane,Jeffrey  Supervising Physician: Karalee Beat  Patient Status: [facility] - Out-pt  History of Present Illness: Mario [name] is a 78 y.o. male with past medical history of asthma, COPD, bronchiectasis, HLD, HTN, CAD, OSA who presents to [facility] today to discuss treatment for his progressively worsening intractable back pain with recent CT imaging findings showing vertebral deformities at T7, T10, L1.  Patient denies recent trauma, fall, or acute onset back pain.  Rather, he described progressive worsening back pain culminating in April at which time he started using a walker to get around and sleeping in the recliner.   He states his back pain waxes and wanes with periodic episodes where he feels he tweaks his back.  He rates his pain as a 6/10 at baseline which responds to taking regular TID dose of Tylenol .  He does occasionally use narcotic pain medication, but has not used in the last 2 weeks as he is concerned that the narcotics make him drowsy increasing his perceived risk of fall.    Of note, he does have planned cardiac ablation procedure scheduled next week.  He is currently on anticoagulation for history of a fib.     Past Medical History:  Diagnosis Date   Arrhythmia    Arthritis    Asthma    Atrial fibrillation (HCC)    Biliary colic 2014   Bronchiectasis (HCC)    2021   Cataract    COPD (chronic obstructive pulmonary disease) with chronic bronchitis (HCC)    Coronary artery calcification seen on CAT scan    Cyst of pancreas    1 cm (7/18), repeat ct in 12 months.     Hyperlipidemia    Hypertension    Obstructive sleep apnea    Renal disorder    Rhinitis    Sleep apnea    pt denies sleep apnea-lost weight    Past Surgical History:  Procedure Laterality Date   CARDIOVERSION N/A 05/14/2024   Procedure: CARDIOVERSION;  Surgeon:  Michele Richardson, DO;  Location: [facility] INVASIVE CV LAB;  Service: Cardiovascular;  Laterality: N/A;   CHOLECYSTECTOMY     COLONOSCOPY  09/29/2014   INCISION AND DRAINAGE OF WOUND Left 03/13/2024   Procedure: IRRIGATION AND DEBRIDEMENT WOUND;  Surgeon: Shari Easter, MD;  Location: [facility] OR;  Service: Orthopedics;  Laterality: Left;  IRRIGATION AND DEBRIDEMENT FIFTH FINGER LEFT   LAPAROSCOPIC CHOLECYSTECTOMY SINGLE SITE WITH INTRAOPERATIVE CHOLANGIOGRAM N/A 09/15/2019   Procedure: LAPAROSCOPIC CHOLECYSTECTOMY SINGLE SITE;  Surgeon: Sheldon Standing, MD;  Location: [facility] ORS;  Service: General;  Laterality: N/A;   LITHOTRIPSY  2000   for kidney stone    Allergies: Dust mite extract  Medications: Prior to Admission medications   Medication Sig Start Date End Date Taking? Authorizing Provider  acetaminophen  (TYLENOL ) 500 MG tablet Take 1,000 mg by mouth every 6 (six) hours as needed for mild pain (pain score 1-3).   Yes [provider]  alendronate  (FOSAMAX ) 70 MG tablet Take 1 tablet (70 mg total) by mouth every 7 (seven) days. Take with a full glass of water  on an empty stomach. 09/06/24  Yes [name] DASEN, MD  Ascorbic Acid (VITAMIN C) 1000 MG tablet Take 1,000 mg by mouth 2 (two) times daily. 11/18/20  Yes [provider]  budesonide -formoterol  (SYMBICORT ) 160-4.5 MCG/ACT inhaler Inhale 2 puffs into the lungs in the morning and at bedtime. Generic medication 05/28/24  Yes Kozlow, Eric J, MD  Cholecalciferol (VITAMIN D3) 125 MCG (5000 UT) TABS Take 1 tablet by mouth 2 (two) times daily.   Yes [provider]  Coenzyme Q10 (CO Q-10) 100 MG CAPS Take 1 capsule by mouth daily.   Yes [provider]  diltiazem  (DILACOR XR ) 240 MG 24 hr capsule Take 1 capsule (240 mg total) by mouth daily. 04/30/24  Yes [name] DASEN, MD  Dupilumab  (DUPIXENT ) 100 MG/0.67ML SOSY Inject into the skin.   Yes [provider]  ELIQUIS  5 MG TABS tablet TAKE 1 TABLET BY MOUTH TWICE A DAY  08/23/24  Yes Court Dorn PARAS, MD  FIBER COMPLETE PO Take 1 tablet by mouth daily.   Yes [provider]  furosemide  (LASIX ) 40 MG tablet Take 1 tablet (40 mg total) by mouth daily. Take additional 20 mg if your weight is 3 lbs in 1 day of 5 lbs in 1 week 04/18/24  Yes Tobie Yetta HERO, MD  guaiFENesin  (MUCINEX ) 600 MG 12 hr tablet Take 1 tablet (600 mg total) by mouth 2 (two) times daily. 04/18/24  Yes Tobie Yetta HERO, MD  Iron , Ferrous Sulfate , 325 (65 Fe) MG TABS Take 325 mg by mouth daily. 09/06/24  Yes [name] DASEN, MD  levalbuterol  (XOPENEX  HFA) 45 MCG/ACT inhaler Inhale 2 puffs into the lungs every 6 (six) hours as needed for wheezing. 04/26/24  Yes [name] DASEN, MD  metoprolol  succinate (TOPROL -XL) 100 MG 24 hr tablet Take 1 tablet (100 mg total) by mouth daily. Take with or immediately following a meal. 04/26/24  Yes Vicci Rollo SAUNDERS, PA-C  montelukast  (SINGULAIR ) 10 MG tablet TAKE 1 TABLET BY MOUTH EVERY DAY 06/03/24  Yes [name] DASEN, MD  pantoprazole  (PROTONIX ) 40 MG tablet TAKE 1 TABLET BY MOUTH TWICE A DAY 06/03/24  Yes [name] DASEN, MD  predniSONE  (DELTASONE ) 5 MG tablet Take 1 tablet (5 mg total) by mouth daily with breakfast. 09/03/24  Yes [name] DASEN, MD  Respiratory Therapy Supplies (FLUTTER) DEVI Use as directed 07/05/18  Yes Darlean Ozell NOVAK, MD  rosuvastatin  (CRESTOR ) 20 MG tablet TAKE 1 TABLET BY MOUTH EVERY DAY **D/C LIPITOR** Patient taking differently: Take 20 mg by mouth every evening. 02/08/24  Yes [name] DASEN, MD  levalbuterol  (XOPENEX ) 0.63 MG/3ML nebulizer solution Take 3 mLs (0.63 mg total) by nebulization 4 (four) times daily for 7 days, THEN 3 mLs (0.63 mg total) every 6 (six) hours as needed for wheezing or shortness of breath. 04/18/24 08/09/24  Tobie Yetta HERO, MD  Olopatadine-Mometasone  (RYALTRIS ) 665-25 MCG/ACT SUSP 2 sprays each nostril 2 times per day Patient not taking: Reported on 09/12/2024 05/28/24   Maurilio Camellia PARAS, MD     Family  History  Problem Relation Age of Onset   Rheumatic fever Mother    Anemia Mother    Stroke Father    Atrial fibrillation Father    Colon polyps Father    Arrhythmia Father    Arthritis Sister    Asthma Maternal Uncle    Colon cancer Neg Hx    Esophageal cancer Neg Hx    Rectal cancer Neg Hx    Stomach cancer Neg Hx     Social History   Socioeconomic History   Marital status: Married    Spouse name: Not on file   Number of children: Not on file   Years of education: Not on file   Highest education level: Bachelor's degree (e.g., BA, AB, BS)  Occupational History   Not on file  Tobacco Use   Smoking status: Former    Current packs/day: 0.00    Average packs/day: 2.0 packs/day for 11.0 years (22.0 ttl pk-yrs)    Types: Cigarettes    Start date: 12/20/1963    Quit date: 12/19/1974    Years since quitting: 49.7   Smokeless tobacco: Never  Vaping Use   Vaping status: Never Used  Substance and Sexual Activity   Alcohol use: Not Currently    Alcohol/week: 1.0 standard drink of alcohol   Drug use: No   Sexual activity: Not on file  Other Topics Concern   Not on file  Social History Narrative   Not on file   Social Drivers of Health   Financial Resource Strain: High Risk (06/08/2024)   Overall Financial Resource Strain (CARDIA)    Difficulty of Paying Living Expenses: Hard  Food Insecurity: No Food Insecurity (06/08/2024)   Hunger Vital Sign    Worried About Running Out of Food in the Last Year: Never true    Ran Out of Food in the Last Year: Never true  Transportation Needs: No Transportation Needs (06/08/2024)   PRAPARE - Administrator, Civil Service (Medical): No    Lack of Transportation (Non-Medical): No  Physical Activity: Inactive (06/08/2024)   Exercise Vital Sign    Days of Exercise per Week: 0 days    Minutes of Exercise per Session: Not on file  Stress: No Stress Concern Present (06/08/2024)   [employer] of Occupational Health - Occupational  Stress Questionnaire    Feeling of Stress: Not at all  Social Connections: Socially Integrated (06/08/2024)   Social Connection and Isolation Panel    Frequency of Communication with Friends and Family: More than three times a week    Frequency of Social Gatherings with Friends and Family: More than three times a week    Attends Religious Services: More than 4 times per year    Active Member of [employer] or Organizations: Yes    Attends Engineer, structural: More than 4 times per year    Marital Status: Married     Review of Systems: A 12 point ROS discussed and pertinent positives are indicated in the HPI above.  All other systems are negative.  Review of Systems  Constitutional:  Negative for fatigue and fever.  Respiratory:  Positive for shortness of breath. Negative for cough.   Cardiovascular:  Negative for chest pain.  Gastrointestinal:  Negative for abdominal pain, nausea and vomiting.  Musculoskeletal:  Positive for back pain.  Psychiatric/Behavioral:  Negative for behavioral problems and confusion.     Vital Signs: BP 107/70 (BP Location: Left Arm, Patient Position: Sitting, Cuff Size: Normal)   Pulse 96   Temp 97.8 F (36.6 C) (Oral)   Resp 16   Wt 204 lb (92.5 kg)   SpO2 96%   BMI 30.13 kg/m   Physical Exam Vitals and nursing note reviewed.  Constitutional:      General: He is not in acute distress.    Appearance: Normal appearance. He is not ill-appearing.  HENT:     Mouth/Throat:     Mouth: Mucous membranes are moist.     Pharynx: Oropharynx is clear.  Cardiovascular:     Rate and Rhythm: Normal rate.  Pulmonary:     Effort: Pulmonary effort is normal.     Comments: Not on baseline O2, converses easily, no increased WOB Musculoskeletal:     Comments:  No point tenderness appreciated throughout the spine.  Mild generalized lower back pain with deep palpation.  Mild generalized soreness of the posterior right ribs with deep palpation.   Skin:     General: Skin is warm and dry.  Neurological:     General: No focal deficit present.     Mental Status: He is alert.  Psychiatric:        Mood and Affect: Mood normal.        Behavior: Behavior normal.        Thought Content: Thought content normal.        Judgment: Judgment normal.     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 EXAM: CT CHEST WITHOUT CONTRAST TECHNIQUE: Multidetector CT imaging of the chest was performed following the standard protocol without IV contrast. RADIATION DOSE REDUCTION: This exam was performed according to the departmental dose-optimization program which includes automated exposure control, adjustment of the mA and/or kV according to patient size and/or use of iterative reconstruction technique. COMPARISON:  05/25/2024 FINDINGS: Cardiovascular: The heart size is normal. No substantial pericardial effusion. Coronary artery calcification is evident. Mild atherosclerotic calcification is noted in the wall of the thoracic aorta. Mediastinum/Nodes: No mediastinal lymphadenopathy. No evidence for gross hilar lymphadenopathy although assessment is limited by the lack of intravenous contrast on the current study. Small to moderate hiatal hernia. The esophagus has normal imaging features. There is no axillary lymphadenopathy. Lungs/Pleura: Volume loss with mild traction bronchiectasis noted in the central upper lobes bilaterally in the right middle lobe. More pronounced volume loss and bronchiectasis seen in the lower lobes, stable in the right lower lobe but decreased in the left lower lobe. Stable 3 mm right upper lobe pulmonary nodule on 33/3, also unchanged comparing back to 09/19/2018 consistent with benign etiology. No followup imaging is recommended. No new suspicious pulmonary nodule or mass. No pleural effusion. Upper Abdomen: Visualized portion of the upper abdomen shows no acute findings.  Musculoskeletal: No worrisome lytic or sclerotic osseous abnormality. Previously identified compression deformity at T12 is stable in the interval. There is new compression deformity at T7, T10, and L1. IMPRESSION: 1. Generally stable appearance of the lungs with volume loss and traction bronchiectasis in the central upper lobes bilaterally and the right middle lobe. More pronounced volume loss and bronchiectasis in the lower lobes, stable in the right lower lobe but decreased in the left lower lobe. 2. Small to moderate hiatal hernia. 3. Stable compression fracture at T12 with new compression deformity at T7, T10, and L1. 4.  Aortic Atherosclerosis (ICD10-I70.0). Electronically Signed   By: Camellia Candle M.D.   On: 09/05/2024 05:59   CT CARDIAC MORPH/PULM VEIN W/CM&W/O CA SCORE Addendum Date: 08/29/2024 ADDENDUM REPORT: 08/29/2024 11:52 EXAM: OVER-READ INTERPRETATION  CT CHEST The following report is an over-read performed by radiologist Dr. Suzen Dials of [facility] on 08/29/2024. This over-read does not include interpretation of cardiac or coronary anatomy or pathology. The coronary calcium  score/coronary CTA interpretation by the cardiologist is attached. COMPARISON:  May 25, 2024 FINDINGS: Cardiovascular: There are no significant extracardiac vascular findings. Mediastinum/Nodes: There are no enlarged lymph nodes within the visualized mediastinum. Lungs/Pleura: There is no pleural effusion. There is mild to moderate severity right middle lobe and bibasilar scarring and/or atelectasis. Upper abdomen: There is a stable moderate to large hiatal hernia with a large amount of surrounding mesenteric fat. Musculoskeletal/Chest wall: Chronic compression fracture deformities are seen within the  mid and lower thoracic spine. IMPRESSION: 1. Mild to moderate severity right middle lobe and bibasilar scarring and/or atelectasis. 2. Stable moderate to large hiatal hernia. Electronically Signed   By:  Suzen Dials M.D.   On: 08/29/2024 11:52   Result Date: 08/29/2024 CLINICAL DATA:  Atrial fibrillation scheduled for an ablation - no comparison. EXAM: Cardiac CT/CTA TECHNIQUE: The patient was scanned on a APEX scanner. A non-contrast, gated CT scan was obtained with axial slices of 3 mm through the heart for calcium  scoring. Calcium  scoring was performed using the Agatston method. A 120 kV prospective, gated, contrast cardiac scan was obtained. Gantry rotation speed was 250 msecs and collimation was 0.6 mm. The 3D data set was reconstructed in 5% intervals. Systolic phases were analyzed on a dedicated workstation using MPR, MIP, and VRT modes. The patient received 100 cc of contrast. FINDINGS: There is normal pulmonary vein drainage into the left atrium (2 on the right and 2 on the left) with ostial measurements as follows: RSPV: 20 mm X 17 mm RIPV: 21 mm X 18 mm LSPV: 16 mm X 13 mm LIPV: 14 mm X 13 mm The esophagus runs in the left atrial midline but is closest to the left inferior pulmonary vein. Left atrial appendage assessment: No evidence of thrombus on delayed imaging. Morphology: Chicken wing Ostial measurements: 18 mm X 15 mm Wall distance: 14 mm Notes on transseptal puncture:  No evidence of PFO or ASD. Other findings: Coronary Arteries: Normal coronary origin. The study was performed without use of NTG and insufficient for optimal plaque evaluation. This effects the accuracy of coronary evaluation. Right dominant system with mild proximal RCA mixed plaque. The LAD has one visualized diagonal with two moderate mid LAD lesions. The LCX is not well visualized but has a mild mixed plaque in the proximal vessel. Coronary Calcium  Score: Left main: 20 Left anterior descending artery: 341 Left circumflex artery: 187 Right coronary artery: 404 Total: 952 Percentile: 71st for age, sex, and race matched control. Aorta: Normal caliber.  Aortic atherosclerosis Main Pulmonary Artery: Mild dilation, 29 mm.  Systemic veins: Normal drainage Aortic Valve: Tri-leaflet.  AV calcium  score 145. Mitral valve: No calcifications. Chamber dimensions: Bi-atrial dilation. Pericardium: Normal thickness Extra-cardiac findings: See attached radiology report for non-cardiac structures. Artifacts: NA Quality: Excellent IMPRESSION: 1. There is normal pulmonary vein drainage into the left atrium. 2. The left atrial appendage is patent. 3. The esophagus runs in the left atrial midline but is closest to the left inferior pulmonary vein. 4. Moderate mixed plaque LAD stenoses on non-nitrogen assessment. Consider ischemic work up/stress testing if clinically indicated. RECOMMENDATIONS: The proposed cut-off value of 1,651 AU yielded a 93 % sensitivity and 75 % specificity in grading AS severity in patients with classical low-flow, low-gradient AS. Proposed different cut-off values to define severe AS for men and women as 2,065 AU and 1,274 AU, respectively. The joint European and American recommendations for the assessment of AS consider the aortic valve calcium  score as a continuum - a very high calcium  score suggests severe AS and a low calcium  score suggests severe AS is unlikely. Donney VEAR Jarome LULLA Stephen RENETTE, et al. 2017 ESC/EACTS Guidelines for the management of valvular heart disease. Eur Heart J [phone] Coronary artery calcium  (CAC) score is a strong predictor of incident coronary heart disease (CHD) and provides predictive information beyond traditional risk factors. CAC scoring is reasonable to use in the decision to withhold, postpone, or initiate statin therapy in intermediate-risk or selected  borderline-risk asymptomatic adults (age 67-75 years and LDL-C >=70 to <190 mg/dL) who do not have diabetes or established atherosclerotic cardiovascular disease (ASCVD).* In intermediate-risk (10-year ASCVD risk >=7.5% to <20%) adults or selected borderline-risk (10-year ASCVD risk >=5% to <7.5%) adults in whom a CAC score is measured  for the purpose of making a treatment decision the following recommendations have been made: If CAC = 0, it is reasonable to withhold statin therapy and reassess in 5 to 10 years, as long as higher risk conditions are absent (diabetes mellitus, family history of premature CHD in first degree relatives (males <55 years; females <65 years), cigarette smoking, LDL >=190 mg/dL or other independent risk factors). If CAC is 1 to 99, it is reasonable to initiate statin therapy for patients >=42 years of age. If CAC is >=100 or >=75th percentile, it is reasonable to initiate statin therapy at any age. Cardiology referral should be considered for patients with CAC scores =400 or >=75th percentile. *2018 AHA/ACC/AACVPR/AAPA/ABC/ACPM/ADA/AGS/APhA/ASPC/NLA/PCNA Guideline on the Management of Blood Cholesterol: A Report of the American College of Cardiology/American Heart Association Task Force on Clinical Practice Guidelines. J Am Coll Cardiol. 2019;73(24):3168-3209. Stanly Leavens, MD Electronically Signed: By: Stanly Leavens M.D. On: 08/28/2024 08:27   DG BONE DENSITY (DXA) Result Date: 08/29/2024 EXAM: DUAL X-RAY ABSORPTIOMETRY (DXA) FOR BONE MINERAL DENSITY 08/28/2024 10:46 am CLINICAL DATA:  78 year old Male History of vertebral fracture. Patient is or has been on glucocorticoid therapy. TECHNIQUE: An axial (e.g., hips, spine) and/or appendicular (e.g., radius) exam was performed, as appropriate, using GE Secretary/administrator at [employer]. Images are obtained for bone mineral density measurement and are not obtained for diagnostic purposes. [id number] Exclusions: L1, L4. COMPARISON:  None. FINDINGS: Scan quality: Good. LUMBAR SPINE (L2-L3): BMD (in g/cm2): 1.188 T-score: -0.5 Z-score: -0.4 LEFT FEMORAL NECK: BMD (in g/cm2): 0.880 T-score: -1.5 Z-score: -0.3 LEFT TOTAL HIP: BMD (in g/cm2): 0.918 T-score: -1.3 Z-score: -0.5 RIGHT FEMORAL NECK: BMD (in g/cm2): 0.908 T-score: -1.2  Z-score: -0.1 RIGHT TOTAL HIP: BMD (in g/cm2): 0.937 T-score: -1.1 Z-score: -0.4 FRAX 10-YEAR PROBABILITY OF FRACTURE: Based on the World Health Organization FRAX model, the 10 year probability of a major osteoporotic fracture is 10%. The 10 year probability of a hip fracture is 3%. IMPRESSION: Osteopenia based on BMD. Fracture risk is increased. Increased risk is based on low BMD and history of vertebral fracture. RECOMMENDATIONS: 1. All patients should optimize calcium  and vitamin D intake. 2. Consider FDA-approved medical therapies in postmenopausal women and men aged 80 years and older, based on the following: - A hip or vertebral (clinical or morphometric) fracture - T-score less than or equal to -2.5 and secondary causes have been excluded. - Low bone mass (T-score between -1.0 and -2.5) and a 10-year probability of a hip fracture greater than or equal to 3% or a 10-year probability of a major osteoporosis-related fracture greater than or equal to 20% based on the [place] -adapted WHO algorithm. - Clinician judgment and/or patient preferences may indicate treatment for people with 10-year fracture probabilities above or below these levels 3. Patients with diagnosis of osteoporosis or at high risk for fracture should have regular bone mineral density tests. For patients eligible for Medicare, routine testing is allowed once every 2 years. The testing frequency can be increased to one year for patients who have rapidly progressing disease, those who are receiving or discontinuing medical therapy to restore bone mass, or have additional risk factors. Electronically Signed   By:  Reyes Phi M.D.   On: 08/29/2024 11:48    Labs:  CBC: Recent Labs    05/07/24 1652 05/28/24 1157 07/26/24 1109 08/27/24 1448  WBC 14.7* 10.4 12.9* 12.1*  HGB 13.8 13.1 12.6* 12.3*  HCT 43.0 41.0 41.2 40.8  PLT 275 264 339 398    COAGS: No results for input(s): INR, APTT in the last 8760 hours.  BMP: Recent Labs     04/15/24 2300 04/15/24 2321 04/16/24 0621 04/17/24 0442 04/18/24 0438 04/26/24 1022 05/07/24 1652 07/26/24 1110 08/27/24 1448  NA 138   < > 140 139 140 139 140 140 138  K 3.9   < > 3.8 3.8 4.5 4.5 4.9 4.5 4.0  CL 105  --  104 101 98 101 104 103 101  CO2 22  --  26 25 29  32 29 23 18*  GLUCOSE 119*  --  98 94 104* 96 106* 104* 91  BUN 28*  --  25* 29* 35* 33* 43* 33* 31*  CALCIUM  9.8  --  9.6 9.7 10.3 9.3 9.3 9.3 9.0  CREATININE 1.17  --  1.03 1.00 1.17 1.07 1.16 0.78 0.83  GFRNONAA >60  --  >60 >60 >60  --   --   --   --    < > = values in this interval not displayed.    LIVER FUNCTION TESTS: Recent Labs    04/15/24 2300 04/16/24 0621 04/17/24 0442 05/07/24 1652  BILITOT 0.7 0.9 1.0 0.6  AST 36 25 23 37*  ALT 38 34 32 57*  ALKPHOS 51 43 41  --   PROT 6.6 5.8* 6.1* 6.1  ALBUMIN 2.8* 2.5* 2.6*  --     TUMOR MARKERS: No results for input(s): AFPTM, CEA, CA199, CHROMGRNA in the last 8760 hours.  Assessment and Plan: Intractable back pain T7, T10, L1 vertebral deformities concerning for possible compression fractures on recent CT 09/05/24 Patient presents to [facility] [place] today for evaluation and management of his progressively worsening back pain.  He reports increased pain in the lower and mid back.  States standing makes his pain worse such that he cannot stand for longer than a few minutes and now requires a walk for ambulation.  He has also been sleeping in a recliner to help with back pain as well as his shortness of breath.  He does not have point tenderness in his mid or lower back.   Patient would benefit from designated MR Thoracic spine to better characterize the deformities at T7, T10, and L1.  Of note however he does have a scheduled cardiac ablation procedure next week and will need to be on continuous anticoagulation for 6 months.  This will preclude his ability to undergo vertebroplasty/kyphoplasty in the near future.   Recommend he continue with  conservative management for now and consider imaging if his back pain remains unmanaged in 6 months.    Thank you for this interesting consult.  I greatly enjoyed meeting Mario [name] and look forward to participating in their care.  A copy of this report was sent to the requesting provider on this date.  Electronically Signed: Corion Sherrod Sue-Ellen Livianna Petraglia, PA 09/12/2024, 1:28 PM

## 2024-09-12 NOTE — Telephone Encounter (Signed)
 L/m for patient to contact em to discuss Dupixent  My way had faxed me advising patient does not meet financial criteria for program but may want to try another PAP for copay

## 2024-09-16 DIAGNOSIS — J449 Chronic obstructive pulmonary disease, unspecified: Secondary | ICD-10-CM | POA: Diagnosis not present

## 2024-09-16 DIAGNOSIS — R269 Unspecified abnormalities of gait and mobility: Secondary | ICD-10-CM | POA: Diagnosis not present

## 2024-09-16 DIAGNOSIS — M6281 Muscle weakness (generalized): Secondary | ICD-10-CM | POA: Diagnosis not present

## 2024-09-16 MED ORDER — DUPIXENT 300 MG/2ML ~~LOC~~ SOSY: 300.0000 mg | PREFILLED_SYRINGE | SUBCUTANEOUS | 11 refills | Status: DC

## 2024-09-16 NOTE — Addendum Note (Signed)
 Addended by: [name] on: 09/16/2024 01:47 PM   Modules accepted: Orders

## 2024-09-16 NOTE — Addendum Note (Signed)
 Addended by: [name] on: 09/16/2024 05:44 PM   Modules accepted: Orders

## 2024-09-17 DIAGNOSIS — M7918 Myalgia, other site: Secondary | ICD-10-CM | POA: Diagnosis not present

## 2024-09-17 DIAGNOSIS — M546 Pain in thoracic spine: Secondary | ICD-10-CM | POA: Diagnosis not present

## 2024-09-17 DIAGNOSIS — S32010D Wedge compression fracture of first lumbar vertebra, subsequent encounter for fracture with routine healing: Secondary | ICD-10-CM | POA: Diagnosis not present

## 2024-09-17 MED ORDER — DUPIXENT 300 MG/2ML ~~LOC~~ SOSY: 300.0000 mg | PREFILLED_SYRINGE | SUBCUTANEOUS | 11 refills | Status: DC

## 2024-09-17 NOTE — Addendum Note (Signed)
 Addended by: [name] on: 09/17/2024 12:36 PM   Modules accepted: Orders

## 2024-09-18 DIAGNOSIS — J45909 Unspecified asthma, uncomplicated: Secondary | ICD-10-CM | POA: Diagnosis not present

## 2024-09-18 DIAGNOSIS — J449 Chronic obstructive pulmonary disease, unspecified: Secondary | ICD-10-CM | POA: Diagnosis not present

## 2024-09-18 NOTE — Pre-Procedure Instructions (Signed)
 Attempted to call patient regarding procedure instructions for tomorrow.  Left voicemail on the following items: Arrival time 0930 Nothing to eat or drink after midnight No meds AM of procedure Responsible person to drive you home and stay with you for 24 hrs  Have you missed any doses of anti-coagulant Eliquis - should be taken twice a day, if you have missed any doses please let us  know.  Don't take dose morning of procedure.

## 2024-09-19 ENCOUNTER — Other Ambulatory Visit: Payer: Self-pay

## 2024-09-19 ENCOUNTER — Ambulatory Visit (HOSPITAL_COMMUNITY)
Admission: RE | Admit: 2024-09-19 | Discharge: 2024-09-19 | Disposition: A | Attending: Cardiovascular Disease | Admitting: Cardiovascular Disease

## 2024-09-19 ENCOUNTER — Ambulatory Visit (HOSPITAL_COMMUNITY): Admitting: Anesthesiology

## 2024-09-19 ENCOUNTER — Ambulatory Visit (HOSPITAL_COMMUNITY)
Admission: RE | Disposition: A | Discharge status: Home / Self Care | Payer: Self-pay | Source: Home / Self Care | Attending: Cardiovascular Disease

## 2024-09-19 DIAGNOSIS — I110 Hypertensive heart disease with heart failure: Secondary | ICD-10-CM | POA: Insufficient documentation

## 2024-09-19 DIAGNOSIS — I2510 Atherosclerotic heart disease of native coronary artery without angina pectoris: Secondary | ICD-10-CM | POA: Insufficient documentation

## 2024-09-19 DIAGNOSIS — I4819 Other persistent atrial fibrillation: Secondary | ICD-10-CM

## 2024-09-19 DIAGNOSIS — I10 Essential (primary) hypertension: Secondary | ICD-10-CM | POA: Diagnosis not present

## 2024-09-19 DIAGNOSIS — Z87891 Personal history of nicotine dependence: Secondary | ICD-10-CM | POA: Diagnosis not present

## 2024-09-19 DIAGNOSIS — I4891 Unspecified atrial fibrillation: Secondary | ICD-10-CM | POA: Diagnosis not present

## 2024-09-19 DIAGNOSIS — G4733 Obstructive sleep apnea (adult) (pediatric): Secondary | ICD-10-CM | POA: Insufficient documentation

## 2024-09-19 DIAGNOSIS — Z79899 Other long term (current) drug therapy: Secondary | ICD-10-CM | POA: Insufficient documentation

## 2024-09-19 DIAGNOSIS — J4489 Other specified chronic obstructive pulmonary disease: Secondary | ICD-10-CM | POA: Insufficient documentation

## 2024-09-19 DIAGNOSIS — I5030 Unspecified diastolic (congestive) heart failure: Secondary | ICD-10-CM | POA: Insufficient documentation

## 2024-09-19 HISTORY — PX: ATRIAL FIBRILLATION ABLATION: EP1191

## 2024-09-19 LAB — POCT ACTIVATED CLOTTING TIME: Activated Clotting Time: 418 s

## 2024-09-19 MED ORDER — ATROPINE SULFATE 1 MG/10ML IJ SOSY
PREFILLED_SYRINGE | INTRAMUSCULAR | Status: DC | PRN
  Administered 2024-09-19: 1 mg via INTRAVENOUS

## 2024-09-19 MED ORDER — PROPOFOL 10 MG/ML IV BOLUS
INTRAVENOUS | Status: DC | PRN
  Administered 2024-09-19: 80 mg via INTRAVENOUS
  Administered 2024-09-19: 70 mg via INTRAVENOUS

## 2024-09-19 MED ORDER — PROTAMINE SULFATE 10 MG/ML IV SOLN
INTRAVENOUS | Status: DC | PRN
  Administered 2024-09-19: 50 mg via INTRAVENOUS

## 2024-09-19 MED ORDER — HEPARIN (PORCINE) IN NACL 1000-0.9 UT/500ML-% IV SOLN
INTRAVENOUS | Status: DC | PRN
  Administered 2024-09-19 (×3): 500 mL

## 2024-09-19 MED ORDER — ROCURONIUM BROMIDE 10 MG/ML (PF) SYRINGE
PREFILLED_SYRINGE | INTRAVENOUS | Status: DC | PRN
  Administered 2024-09-19: 70 mg via INTRAVENOUS

## 2024-09-19 MED ORDER — LIDOCAINE 2% (20 MG/ML) 5 ML SYRINGE
INTRAMUSCULAR | Status: DC | PRN
  Administered 2024-09-19: 60 mg via INTRAVENOUS

## 2024-09-19 MED ORDER — PHENYLEPHRINE 80 MCG/ML (10ML) SYRINGE FOR IV PUSH (FOR BLOOD PRESSURE SUPPORT)
PREFILLED_SYRINGE | INTRAVENOUS | Status: DC | PRN
  Administered 2024-09-19: 160 ug via INTRAVENOUS
  Administered 2024-09-19: 320 ug via INTRAVENOUS
  Administered 2024-09-19 (×2): 160 ug via INTRAVENOUS

## 2024-09-19 MED ORDER — FENTANYL CITRATE (PF) 100 MCG/2ML IJ SOLN
INTRAMUSCULAR | Status: AC
  Filled 2024-09-19: qty 2

## 2024-09-19 MED ORDER — SODIUM CHLORIDE 0.9 % IV SOLN: 250.0000 mL | INTRAVENOUS | Status: DC | PRN

## 2024-09-19 MED ORDER — SODIUM CHLORIDE 0.9% FLUSH: 3.0000 mL | INTRAVENOUS | Status: DC | PRN

## 2024-09-19 MED ORDER — ACETAMINOPHEN 325 MG PO TABS: 650.0000 mg | ORAL_TABLET | ORAL | Status: DC | PRN

## 2024-09-19 MED ORDER — DEXAMETHASONE SODIUM PHOSPHATE 10 MG/ML IJ SOLN
INTRAMUSCULAR | Status: DC | PRN
  Administered 2024-09-19: 10 mg via INTRAVENOUS

## 2024-09-19 MED ORDER — FENTANYL CITRATE (PF) 250 MCG/5ML IJ SOLN
INTRAMUSCULAR | Status: DC | PRN
  Administered 2024-09-19: 25 ug via INTRAVENOUS

## 2024-09-19 MED ORDER — PHENYLEPHRINE HCL-NACL 20-0.9 MG/250ML-% IV SOLN
INTRAVENOUS | Status: DC | PRN
  Administered 2024-09-19: 50 ug/min via INTRAVENOUS

## 2024-09-19 MED ORDER — ONDANSETRON HCL 4 MG/2ML IJ SOLN
INTRAMUSCULAR | Status: DC | PRN
Start: 2024-09-19 — End: 2024-09-19
  Administered 2024-09-19: 4 mg via INTRAVENOUS

## 2024-09-19 MED ORDER — NOREPINEPHRINE 4 MG/250ML-% IV SOLN
INTRAVENOUS | Status: DC | PRN
  Administered 2024-09-19: 5 ug/min via INTRAVENOUS

## 2024-09-19 MED ORDER — HEPARIN SODIUM (PORCINE) 1000 UNIT/ML IJ SOLN
INTRAMUSCULAR | Status: DC | PRN
Start: 2024-09-19 — End: 2024-09-19
  Administered 2024-09-19: 16000 [IU] via INTRAVENOUS

## 2024-09-19 MED ORDER — IPRATROPIUM-ALBUTEROL 0.5-2.5 (3) MG/3ML IN SOLN: 3.0000 mL | Freq: Once | RESPIRATORY_TRACT | Status: DC

## 2024-09-19 MED ORDER — NOREPINEPHRINE BITARTRATE 1 MG/ML IV SOLN
INTRAVENOUS | Status: DC | PRN
  Administered 2024-09-19 (×3): 1 mL via INTRAVENOUS

## 2024-09-19 MED ORDER — ESMOLOL HCL 100 MG/10ML IV SOLN
INTRAVENOUS | Status: DC | PRN
  Administered 2024-09-19 (×2): 30 mg via INTRAVENOUS
  Administered 2024-09-19: 20 mg via INTRAVENOUS

## 2024-09-19 MED ORDER — SODIUM CHLORIDE 0.9 % IV SOLN: INTRAVENOUS | Status: DC

## 2024-09-19 MED ORDER — SODIUM CHLORIDE 0.9% FLUSH: 3.0000 mL | Freq: Two times a day (BID) | INTRAVENOUS | Status: DC

## 2024-09-19 MED ORDER — ONDANSETRON HCL 4 MG/2ML IJ SOLN: 4.0000 mg | Freq: Four times a day (QID) | INTRAMUSCULAR | Status: DC | PRN

## 2024-09-19 MED ORDER — IPRATROPIUM-ALBUTEROL 0.5-2.5 (3) MG/3ML IN SOLN
RESPIRATORY_TRACT | Status: AC
  Administered 2024-09-19: 3 mL via RESPIRATORY_TRACT
  Filled 2024-09-19: qty 3

## 2024-09-19 MED ORDER — SUGAMMADEX SODIUM 200 MG/2ML IV SOLN
INTRAVENOUS | Status: DC | PRN
  Administered 2024-09-19: 200 mg via INTRAVENOUS

## 2024-09-19 SURGICAL SUPPLY — 19 items
BLANKET WARM UNDERBOD FULL ACC (MISCELLANEOUS) ×1 IMPLANT
CABLE FARASTAR GEN2 SNGL USE (CABLE) IMPLANT
CATH FARAWAVE 2.0 31 (CATHETERS) IMPLANT
CATH GE 8FR SOUNDSTAR (CATHETERS) IMPLANT
CATH OCTARAY 2.0 F 3-3-3-3-3 (CATHETERS) IMPLANT
CATH WEBSTER BI DIR CS D-F CRV (CATHETERS) IMPLANT
CLOSURE PERCLOSE PROSTYLE (Vascular Products) IMPLANT
COVER SWIFTLINK CONNECTOR (BAG) ×1 IMPLANT
DEVICE CLOSURE MYNXGRIP 6/7F (Vascular Products) IMPLANT
DILATOR VESSEL 38 20CM 16FR (INTRODUCER) IMPLANT
GUIDEWIRE INQWIRE 1.5J.035X260 (WIRE) IMPLANT
KIT VERSACROSS CNCT FARADRIVE (KITS) IMPLANT
PACK EP LF (CUSTOM PROCEDURE TRAY) ×1 IMPLANT
PAD DEFIB RADIO PHYSIO CONN (PAD) ×1 IMPLANT
PATCH CARTO3 (PAD) IMPLANT
SHEATH FARADRIVE STEERABLE (SHEATH) IMPLANT
SHEATH PINNACLE 8F 10CM (SHEATH) IMPLANT
SHEATH PINNACLE 9F 10CM (SHEATH) IMPLANT
SHEATH PROBE COVER 6X72 (BAG) IMPLANT

## 2024-09-19 NOTE — Anesthesia Procedure Notes (Signed)
 Procedure Name: Intubation Date/Time: 09/19/2024 12:03 PM  Performed by: Worth Peppers, CRNAPre-anesthesia Checklist: Patient identified, Emergency Drugs available, Suction available and Patient being monitored Patient Re-evaluated:Patient Re-evaluated prior to induction Oxygen Delivery Method: Circle System Utilized Preoxygenation: Pre-oxygenation with 100% oxygen Induction Type: IV induction Ventilation: Mask ventilation without difficulty Laryngoscope Size: Miller and 3 Grade View: Grade II Tube type: Oral Tube size: 7.5 mm Number of attempts: 1 Airway Equipment and Method: Stylet and Oral airway Placement Confirmation: ETT inserted through vocal cords under direct vision, positive ETCO2 and breath sounds checked- equal and bilateral Secured at: 22 cm Tube secured with: Tape Dental Injury: Teeth and Oropharynx as per pre-operative assessment

## 2024-09-19 NOTE — Anesthesia Preprocedure Evaluation (Signed)
 Anesthesia Evaluation  Patient identified by MRN, date of birth, ID band Patient awake    Reviewed: Allergy  & Precautions, NPO status , Patient's Chart, lab work & pertinent test results  History of Anesthesia Complications Negative for: history of anesthetic complications  Airway Mallampati: II  TM Distance: >3 FB Neck ROM: Full    Dental  (+) Teeth Intact   Pulmonary asthma , sleep apnea , COPD,  COPD inhaler, Patient abstained from smoking.Not current smoker, former smoker   Pulmonary exam normal breath sounds clear to auscultation       Cardiovascular Exercise Tolerance: Good METShypertension, + CAD and +CHF  (-) Past MI (-) dysrhythmias  Rhythm:Irregular Rate:Normal - Systolic murmurs 1. Left ventricular ejection fraction, by estimation, is 50 to 55%. The  left ventricle has low normal function. Left ventricular endocardial  border not optimally defined to evaluate regional wall motion. There is  mild concentric left ventricular  hypertrophy. Left ventricular diastolic function could not be evaluated.   2. Right ventricular systolic function is low normal. The right  ventricular size is normal.   3. Left atrial size was moderately dilated.   4. The mitral valve is normal in structure. Trivial mitral valve  regurgitation. No evidence of mitral stenosis.   5. The aortic valve is grossly normal. There is mild calcification of the  aortic valve. There is mild thickening of the aortic valve. Aortic valve  regurgitation is not visualized. Aortic valve sclerosis/calcification is  present, without any evidence of  aortic stenosis.     Neuro/Psych negative neurological ROS  negative psych ROS   GI/Hepatic hiatal hernia,neg GERD  ,,(+)     (-) substance abuse    Endo/Other  neg diabetes    Renal/GU Renal disease     Musculoskeletal   Abdominal   Peds  Hematology   Anesthesia Other Findings Past Medical  History: No date: Arrhythmia No date: Arthritis No date: Asthma No date: Atrial fibrillation [facility]) 2014: Biliary colic No date: Bronchiectasis [facility])     Comment:  2021 No date: Cataract No date: COPD (chronic obstructive pulmonary disease) with chronic  bronchitis (HCC) No date: Coronary artery calcification seen on CAT scan No date: Cyst of pancreas     Comment:  1 cm (7/18), repeat ct in 12 months.   No date: Hyperlipidemia No date: Hypertension No date: Obstructive sleep apnea No date: Renal disorder No date: Rhinitis No date: Sleep apnea     Comment:  pt denies sleep apnea-lost weight  Reproductive/Obstetrics                              Anesthesia Physical Anesthesia Plan  ASA: 3  Anesthesia Plan: General   Post-op Pain Management: Minimal or no pain anticipated   Induction: Intravenous  PONV Risk Score and Plan: 2 and Ondansetron , Dexamethasone  and Treatment may vary due to age or medical condition  Airway Management Planned: Oral ETT  Additional Equipment: None  Intra-op Plan:   Post-operative Plan: Extubation in OR  Informed Consent: I have reviewed the patients History and Physical, chart, labs and discussed the procedure including the risks, benefits and alternatives for the proposed anesthesia with the patient or authorized representative who has indicated his/her understanding and acceptance.     Dental advisory given  Plan Discussed with: CRNA and Surgeon  Anesthesia Plan Comments: (Discussed risks of anesthesia with patient, including PONV, sore throat, lip/dental/eye damage. Rare risks discussed as well,  such as cardiorespiratory and neurological sequelae, and allergic reactions. Discussed the role of CRNA in patient's perioperative care. Patient understands.  Plan for preob [facility])        Anesthesia Quick Evaluation

## 2024-09-19 NOTE — Transfer of Care (Signed)
 Immediate Anesthesia Transfer of Care Note  Patient: Mario [name]  Procedure(s) Performed: ATRIAL FIBRILLATION ABLATION  Patient Location: PACU  Anesthesia Type:General  Level of Consciousness: awake, alert , and oriented  Airway & Oxygen Therapy: Patient Spontanous Breathing and Patient connected to face mask oxygen  Post-op Assessment: Report given to RN and Post -op Vital signs reviewed and stable  Post vital signs: Reviewed and stable  Last Vitals:  Vitals Value Taken Time  BP 99/66 09/19/24 13:26  Temp    Pulse 92 09/19/24 13:28  Resp 17 09/19/24 13:28  SpO2 100 % 09/19/24 13:28  Vitals shown include unfiled device data.  Last Pain:  Vitals:   09/19/24 1020  TempSrc: Oral  PainSc:          Complications: No notable events documented.

## 2024-09-19 NOTE — Discharge Instructions (Signed)

## 2024-09-19 NOTE — Progress Notes (Signed)
 Discharge instructions reviewed with patient and wife at bedside. Denies questions or concerns. PT was able to ambulate in the hallway, using a FWW, no s/s of complications. PT was escorted from the unit via wheel chair to personal vehicle.

## 2024-09-19 NOTE — H&P (Signed)
 Electrophysiology Office Note:    Date:  09/19/2024   ID:  Mario [name], DOB 06-Sep-1946, MRN [id number]  PCP:  [name] DASEN, MD   [facility] Providers Cardiologist:  Dorn Lesches, MD Electrophysiologist:  Eulas FORBES Furbish, MD     Referring MD: No ref. provider found   History of Present Illness:    Mario [name] is a 78 y.o. male with a medical history significant for persistent atrial fibrillation, coronary calcification, obstructive sleep apnea on CPAP, hypertension, hyperlipidemia, referred for management of atrial fibrillation.     He was diagnosed with atrial fibrillation in 2019.  Prior to this he experience episodes of palpitations on and off for 2 or so years.  A monitor was placed that did document atrial fibrillation.  He was admitted with atrial fibrillation and RVR in February 2024.  He was discharged with metoprolol  and taken off albuterol .  He was again admitted in April and May 2025 with atrial fibrillation RVR in the setting of COPD exacerbation.  He underwent DC cardioversion on May 14, 2024.  The patient believes that he converted back to atrial fibrillation the day following his cardioversion.   History of Present Illness          Today, he reports that he is at baseline.  He has shortness of breath and fatigue with exertion.    I reviewed the patient's CT and labs. There was no LAA thrombus. he  has not missed any doses of anticoagulation, and he took his dose last night. There have been no changes in the patient's diagnoses, medications, or condition since our recent clinic visit. I reviewed the pulmonologist's note that stated his risk of pulmonary complications is roughly 2-14%. I discussed this risk with the patient and the anesthesiologist. I mentioned the alternative of proceeding with Tikosyn load. He would like to proceed with ablation.    EKGs/Labs/Other Studies Reviewed Today:     Echocardiogram:  TTE April 2025 V EF 50  to 55%.  Left atrial size is moderately dilated.  Aortic sclerosis without stenosis.    Advanced imaging:  NM PET CT cardiac --scheduled Results pending    EKG:         Physical Exam:    VS:  BP 117/80   Pulse (!) 108   Temp 98.1 F (36.7 C) (Oral)   Resp 18   Ht 5' 9 (1.753 m)   Wt 95.3 kg   SpO2 96%   BMI 31.01 kg/m     Wt Readings from Last 3 Encounters:  09/19/24 95.3 kg  09/12/24 92.5 kg  09/03/24 94.8 kg     GEN: Well nourished, well developed in no acute distress CARDIAC: iRRR, no murmurs, rubs, gallops RESPIRATORY:  Normal work of breathing MUSCULOSKELETAL: no edema    ASSESSMENT & PLAN:     Persistent atrial fibrillation Symptomatic with fatigue Symptoms are somewhat confounded by pulmonary disease Had rapid return of A-fib after cardioversion Medical options are limited by coronary calcification and pulmonary disease Tikosyn would be an option though he is on multiple other medications We discussed management options, and he would prefer to undergo ablation if the risk of a general anesthesia is acceptable. I will seek feedback from his pulmonologist and our anesthesiology colleagues.  We discussed the indication, rationale, logistics, anticipated benefits, and potential risks of the ablation procedure including but not limited to -- bleed at the groin access site, chest pain, damage to nearby organs such as the diaphragm,  lungs, or esophagus, need for a drainage tube, or prolonged hospitalization. I explained that the risk for stroke, heart attack, need for open chest surgery, or even death is very low but not zero. he  expressed understanding and wishes to proceed.   CHF with preserved ejection fraction Symptomatic with fatigue I suspect atrial fibrillation is contributing  COPD and asthma Has had a history of frequent flares  Obstructive sleep apnea Compliance to CPAP encouraged  Coronary calcification Noted on CTs dating back to  2019. Cardiac PET scheduled for June 07, 2024    Signed, Eulas FORBES Furbish, MD  09/19/2024 11:31 AM    [facility]

## 2024-09-19 NOTE — Anesthesia Postprocedure Evaluation (Signed)
 Anesthesia Post Note  Patient: Mario [name]  Procedure(s) Performed: ATRIAL FIBRILLATION ABLATION     Patient location during evaluation: PACU Anesthesia Type: General Level of consciousness: awake and alert Pain management: pain level controlled Vital Signs Assessment: post-procedure vital signs reviewed and stable Respiratory status: spontaneous breathing, nonlabored ventilation, respiratory function stable and patient connected to nasal cannula oxygen Cardiovascular status: blood pressure returned to baseline and stable Postop Assessment: no apparent nausea or vomiting Anesthetic complications: no   No notable events documented.  Last Vitals:  Vitals:   09/19/24 1405 09/19/24 1410  BP: 100/61 109/67  Pulse: 87 86  Resp: 14 12  Temp:  36.5 C  SpO2: 100% 98%    Last Pain:  Vitals:   09/19/24 1424  TempSrc:   PainSc: 7    Pain Goal: Patients Stated Pain Goal: 3 (09/19/24 1424)                 Garnette DELENA Gab

## 2024-09-20 ENCOUNTER — Telehealth (HOSPITAL_COMMUNITY): Payer: Self-pay

## 2024-09-20 ENCOUNTER — Encounter (HOSPITAL_COMMUNITY): Payer: Self-pay | Admitting: Cardiovascular Disease

## 2024-09-20 MED FILL — Fentanyl Citrate Preservative Free (PF) Inj 100 MCG/2ML: INTRAMUSCULAR | Qty: 0.5 | Status: AC

## 2024-09-20 NOTE — Telephone Encounter (Signed)
 Spoke with patient to complete post procedure follow up call.  Patient reports no complications with groin sites.   Instructions reviewed with patient:  Remove large bandage at puncture site after 24 hours. It is normal to have bruising, tenderness, mild swelling, and a pea or marble sized lump/knot at the groin site which can take up to three months to resolve.  Get help right away if you notice sudden swelling at the puncture site.  Check your puncture site every day for signs of infection: fever, redness, swelling, pus drainage, warmth, foul odor or excessive pain. If this occurs, please call [phone], to speak with the RN Navigator. Get help right away if your puncture site is bleeding and the bleeding does not stop after applying firm pressure to the area.  You may continue to have skipped beats/ atrial fibrillation during the first several months after your procedure.  It is very important not to miss any doses of your blood thinner Eliquis .    You will follow up with the Afib clinic 4 weeks after your procedure and follow up with the APP 3 months after your procedure.   Patient verbalized understanding to all instructions provided.

## 2024-09-25 DIAGNOSIS — J4540 Moderate persistent asthma, uncomplicated: Secondary | ICD-10-CM | POA: Diagnosis not present

## 2024-09-25 DIAGNOSIS — M8008XA Age-related osteoporosis with current pathological fracture, vertebra(e), initial encounter for fracture: Secondary | ICD-10-CM | POA: Diagnosis not present

## 2024-09-26 ENCOUNTER — Ambulatory Visit: Payer: Self-pay

## 2024-09-26 NOTE — Telephone Encounter (Signed)
 FYI Only or Action Required?: FYI only for provider.  Patient was last seen in primary care on 09/03/2024 by [name] DASEN, MD.  Called Nurse Triage reporting Shortness of Breath.  Symptoms began a week ago.  Interventions attempted: Prescription medications: Rescue inhaler, Symbicort .  Symptoms are: stable.  Triage Disposition: Call PCP Now  Patient/caregiver understands and will follow disposition?: Yes Reason for Disposition  Oxygen level (e.g., pulse oximetry) 91 to 94%  Answer Assessment - Initial Assessment Questions Hx of asthma, patient uses a rescue inhaler and symbicort , finds them helpful but not getting results right now  1. RESPIRATORY STATUS: Describe your breathing? (e.g., wheezing, shortness of breath, unable to speak, severe coughing)      Feels like every time takes in a breath, has to cough, mild SOB  2. ONSET: When did this breathing problem begin?      A week ago, getting worse  3. PATTERN Does the difficult breathing come and go, or has it been constant since it started?      Constant  4. SEVERITY: How bad is your breathing? (e.g., mild, moderate, severe)      Mild  5. RECURRENT SYMPTOM: Have you had difficulty breathing before? If Yes, ask: When was the last time? and What happened that time?      Yes, has had asthma for 50 years  6. CARDIAC HISTORY: Do you have any history of heart disease? (e.g., heart attack, angina, bypass surgery, angioplasty)      Yes, but denies having anything to do with symptoms  7. LUNG HISTORY: Do you have any history of lung disease?  (e.g., pulmonary embolus, asthma, emphysema)     Asthma  8. CAUSE: What do you think is causing the breathing problem?      Has asthma  9. OTHER SYMPTOMS: Do you have any other symptoms? (e.g., chest pain, cough, dizziness, fever, runny nose)     Denies  10. O2 SATURATION MONITOR:  Do you use an oxygen saturation monitor (pulse oximeter) at home? If Yes, ask:  What is your reading (oxygen level) today? What is your usual oxygen saturation reading? (e.g., 95%)       92-93%, states that is normal for him.  Protocols used: Breathing Difficulty-A-AH  Copied from CRM [id number]. Topic: Clinical - Red Word Triage >> Sep 26, 2024  4:53 PM Wess RAMAN wrote: Red Word that prompted transfer to Nurse Triage: Asthma problems: shortness of breath and coughing

## 2024-09-27 ENCOUNTER — Ambulatory Visit (INDEPENDENT_AMBULATORY_CARE_PROVIDER_SITE_OTHER): Admitting: Family Medicine

## 2024-09-27 ENCOUNTER — Encounter: Payer: Self-pay | Admitting: Family Medicine

## 2024-09-27 VITALS — BP 132/74 | HR 90 | Temp 97.6°F | Ht 69.0 in | Wt 204.4 lb

## 2024-09-27 DIAGNOSIS — J471 Bronchiectasis with (acute) exacerbation: Secondary | ICD-10-CM

## 2024-09-27 MED ORDER — AMOXICILLIN-POT CLAVULANATE 875-125 MG PO TABS: 1.0000 | ORAL_TABLET | Freq: Two times a day (BID) | ORAL | 0 refills | Status: DC

## 2024-09-27 NOTE — Progress Notes (Signed)
 Subjective:    Patient ID: Mario [name], male    DOB: 07/21/1946, 78 y.o.   MRN: [id number] Patient recently underwent ablation for atrial fibrillation.  He was in normal sinus rhythm for at least 2 days and felt much better.  However he believes that he has gone back into atrial fibrillation due to an elevated heart rate.  On exam today, the patient has an irregularly irregular rhythm consistent with atrial fibrillation.  However his biggest concern is chest infection that he is required.  His granddaughter has recently been battling an upper respiratory infection.  The patient states that over the last several days he has been having a cough productive of purulent sputum.  He reports foul-smelling sputum.  He reports increasing shortness of breath.  Has been using his albuterol  more frequently.  However on exam today, there were no expiratory wheezes.  His breath sounds are good and full.  He seems to be moving a good amount of air.  He has right basilar crackles and rhonchi consistent with his chronic bronchiectasis however the remainder of his pulmonary exam is better than normal  Past Medical History:  Diagnosis Date   Arrhythmia    Arthritis    Asthma    Atrial fibrillation (HCC)    Biliary colic 2014   Bronchiectasis (HCC)    2021   Cataract    COPD (chronic obstructive pulmonary disease) with chronic bronchitis (HCC)    Coronary artery calcification seen on CAT scan    Cyst of pancreas    1 cm (7/18), repeat ct in 12 months.     Hyperlipidemia    Hypertension    Obstructive sleep apnea    Renal disorder    Rhinitis    Sleep apnea    pt denies sleep apnea-lost weight   Past Surgical History:  Procedure Laterality Date   ATRIAL FIBRILLATION ABLATION N/A 09/19/2024   Procedure: ATRIAL FIBRILLATION ABLATION;  Surgeon: Nancey Eulas BRAVO, MD;  Location: [facility];  Service: Cardiovascular;  Laterality: N/A;   CARDIOVERSION N/A 05/14/2024   Procedure: CARDIOVERSION;   Surgeon: Michele Richardson, DO;  Location: [facility];  Service: Cardiovascular;  Laterality: N/A;   CHOLECYSTECTOMY     COLONOSCOPY  09/29/2014   INCISION AND DRAINAGE OF WOUND Left 03/13/2024   Procedure: IRRIGATION AND DEBRIDEMENT WOUND;  Surgeon: Shari Easter, MD;  Location: [facility];  Service: Orthopedics;  Laterality: Left;  IRRIGATION AND DEBRIDEMENT FIFTH FINGER LEFT   IR RADIOLOGIST EVAL & MGMT  09/12/2024   LAPAROSCOPIC CHOLECYSTECTOMY SINGLE SITE WITH INTRAOPERATIVE CHOLANGIOGRAM N/A 09/15/2019   Procedure: LAPAROSCOPIC CHOLECYSTECTOMY SINGLE SITE;  Surgeon: Sheldon Standing, MD;  Location: [facility];  Service: General;  Laterality: N/A;   LITHOTRIPSY  2000   for kidney stone   Current Outpatient Medications on File Prior to Visit  Medication Sig Dispense Refill   acetaminophen  (TYLENOL ) 500 MG tablet Take 1,000 mg by mouth every 6 (six) hours as needed for mild pain (pain score 1-3).     alendronate  (FOSAMAX ) 70 MG tablet Take 1 tablet (70 mg total) by mouth every 7 (seven) days. Take with a full glass of water  on an empty stomach. 4 tablet 11   Ascorbic Acid (VITAMIN C) 1000 MG tablet Take 1,000 mg by mouth 2 (two) times daily.     budesonide -formoterol  (SYMBICORT ) 160-4.5 MCG/ACT inhaler Inhale 2 puffs into the lungs in the morning and at bedtime. Generic medication 1 each 6   Cholecalciferol (VITAMIN  D3) 125 MCG (5000 UT) TABS Take 2 tablets by mouth daily.     Coenzyme Q10 (CO Q-10) 100 MG CAPS Take 1 capsule by mouth daily.     diltiazem  (DILACOR XR ) 240 MG 24 hr capsule Take 1 capsule (240 mg total) by mouth daily. 90 capsule 1   dupilumab  (DUPIXENT ) 300 MG/2ML prefilled syringe Inject 300 mg into the skin every 14 (fourteen) days. 4 mL 11   ELIQUIS  5 MG TABS tablet TAKE 1 TABLET BY MOUTH TWICE A DAY 60 tablet 5   FIBER COMPLETE PO Take 1 tablet by mouth daily.     furosemide  (LASIX ) 40 MG tablet Take 1 tablet (40 mg total) by mouth daily. Take additional 20 mg if your weight is 3  lbs in 1 day of 5 lbs in 1 week (Patient taking differently: Take 40 mg by mouth See admin instructions. Take 40mg  daily. Take additional 20 mg if your weight is 3 lbs in 1 day of 5 lbs in 1 week) 30 tablet 3   guaiFENesin  (MUCINEX ) 600 MG 12 hr tablet Take 1 tablet (600 mg total) by mouth 2 (two) times daily. 60 tablet 0   Iron , Ferrous Sulfate , 325 (65 Fe) MG TABS Take 325 mg by mouth daily. 30 tablet 11   levalbuterol  (XOPENEX  HFA) 45 MCG/ACT inhaler Inhale 2 puffs into the lungs every 6 (six) hours as needed for wheezing. 1 each 12   metoprolol  succinate (TOPROL -XL) 100 MG 24 hr tablet Take 1 tablet (100 mg total) by mouth daily. Take with or immediately following a meal. 90 tablet 3   montelukast  (SINGULAIR ) 10 MG tablet TAKE 1 TABLET BY MOUTH EVERY DAY (Patient taking differently: Take 10 mg by mouth at bedtime.) 90 tablet 2   pantoprazole  (PROTONIX ) 40 MG tablet TAKE 1 TABLET BY MOUTH TWICE A DAY 180 tablet 2   Respiratory Therapy Supplies (FLUTTER) DEVI Use as directed 1 each 0   rosuvastatin  (CRESTOR ) 20 MG tablet TAKE 1 TABLET BY MOUTH EVERY DAY **D/C LIPITOR** 90 tablet 2   levalbuterol  (XOPENEX ) 0.63 MG/3ML nebulizer solution Take 3 mLs (0.63 mg total) by nebulization 4 (four) times daily for 7 days, THEN 3 mLs (0.63 mg total) every 6 (six) hours as needed for wheezing or shortness of breath.     Olopatadine-Mometasone  (RYALTRIS ) 665-25 MCG/ACT SUSP 2 sprays each nostril 2 times per day (Patient not taking: Reported on 09/12/2024) 29 g 3   predniSONE  (DELTASONE ) 5 MG tablet Take 1 tablet (5 mg total) by mouth daily with breakfast. 30 tablet 1   No current facility-administered medications on file prior to visit.   Allergies  Allergen Reactions   Dust Mite Extract Other (See Comments)    Asthma related   Social History   Socioeconomic History   Marital status: Married    Spouse name: Not on file   Number of children: Not on file   Years of education: Not on file   Highest  education level: Bachelor's degree (e.g., BA, AB, BS)  Occupational History   Not on file  Tobacco Use   Smoking status: Former    Current packs/day: 0.00    Average packs/day: 2.0 packs/day for 11.0 years (22.0 ttl pk-yrs)    Types: Cigarettes    Start date: 12/20/1963    Quit date: 12/19/1974    Years since quitting: 49.8   Smokeless tobacco: Never  Vaping Use   Vaping status: Never Used  Substance and Sexual Activity   Alcohol use:  Not Currently    Alcohol/week: 1.0 standard drink of alcohol   Drug use: No   Sexual activity: Not on file  Other Topics Concern   Not on file  Social History Narrative   Not on file   Social Drivers of Health   Financial Resource Strain: High Risk (06/08/2024)   Overall Financial Resource Strain (CARDIA)    Difficulty of Paying Living Expenses: Hard  Food Insecurity: No Food Insecurity (06/08/2024)   Hunger Vital Sign    Worried About Running Out of Food in the Last Year: Never true    Ran Out of Food in the Last Year: Never true  Transportation Needs: No Transportation Needs (06/08/2024)   PRAPARE - Administrator, Civil Service (Medical): No    Lack of Transportation (Non-Medical): No  Physical Activity: Inactive (06/08/2024)   Exercise Vital Sign    Days of Exercise per Week: 0 days    Minutes of Exercise per Session: Not on file  Stress: No Stress Concern Present (06/08/2024)   [employer] of Occupational Health - Occupational Stress Questionnaire    Feeling of Stress: Not at all  Social Connections: Socially Integrated (06/08/2024)   Social Connection and Isolation Panel    Frequency of Communication with Friends and Family: More than three times a week    Frequency of Social Gatherings with Friends and Family: More than three times a week    Attends Religious Services: More than 4 times per year    Active Member of [employer] or Organizations: Yes    Attends Banker Meetings: More than 4 times per year    Marital  Status: Married  Catering manager Violence: Not At Risk (05/08/2024)   Humiliation, Afraid, Rape, and Kick questionnaire    Fear of Current or Ex-Partner: No    Emotionally Abused: No    Physically Abused: No    Sexually Abused: No      Review of Systems  Respiratory:  Positive for cough.   All other systems reviewed and are negative.      Objective:   Physical Exam Vitals reviewed.  Constitutional:      Appearance: He is well-developed.  HENT:     Head: Normocephalic and atraumatic.     Nose: Nose normal.     Mouth/Throat:     Pharynx: No oropharyngeal exudate.  Neck:     Vascular: No carotid bruit.  Cardiovascular:     Rate and Rhythm: Normal rate. Rhythm irregular.     Heart sounds: Normal heart sounds.  Pulmonary:     Effort: Pulmonary effort is normal. No tachypnea or respiratory distress.     Breath sounds: No stridor or decreased air movement. Examination of the right-lower field reveals rhonchi and rales. Rhonchi and rales present. No wheezing.           Assessment & Plan:  Bronchiectasis with (acute) exacerbation (HCC) I see no indication for steroids or albuterol  today on his exam.  I recommended incentive spirometry.  I recommended flutter valve.  I recommended Mucinex .  Will use Augmentin  875 mg twice daily given his bronchiectasis for possible bacterial infection given the purulent sputum and foul-smelling sputum however I suspect that this is most likely viral.  Patient appears to be back in atrial fibrillation

## 2024-09-30 ENCOUNTER — Other Ambulatory Visit

## 2024-09-30 ENCOUNTER — Telehealth: Payer: Self-pay

## 2024-09-30 NOTE — Telephone Encounter (Signed)
 Order faxed for [facility] [place] to [phone], [facility]. Mjp,lpn

## 2024-10-02 NOTE — Addendum Note (Signed)
 Encounter addended by: Jarold Nest on: 10/02/2024 8:39 AM  Actions taken: Imaging Exam ended

## 2024-10-14 ENCOUNTER — Other Ambulatory Visit

## 2024-10-17 ENCOUNTER — Ambulatory Visit (HOSPITAL_COMMUNITY)
Admit: 2024-10-17 | Discharge: 2024-10-17 | Disposition: A | Discharge status: Home / Self Care | Attending: Physician Assistant | Admitting: Physician Assistant

## 2024-10-17 VITALS — BP 112/76 | HR 96 | Ht 69.0 in | Wt 207.4 lb

## 2024-10-17 DIAGNOSIS — D6869 Other thrombophilia: Secondary | ICD-10-CM | POA: Diagnosis not present

## 2024-10-17 DIAGNOSIS — I4819 Other persistent atrial fibrillation: Secondary | ICD-10-CM | POA: Diagnosis not present

## 2024-10-17 DIAGNOSIS — I4891 Unspecified atrial fibrillation: Secondary | ICD-10-CM | POA: Diagnosis not present

## 2024-10-17 NOTE — Patient Instructions (Signed)
 Cardioversion scheduled for: Wednesday, November 12th   - Arrive at the [facility] ([address]  and check in with ADMITTING at 8:00 AM   - Do not eat or drink anything after midnight the night prior to your procedure.   - Take all your morning medication (except diabetic medications) with a sip of water  prior to arrival.  - Do NOT miss any doses of your blood thinner - if you should miss a dose or take a dose more than 4 hours late -- please notify our office immediately.  - You will not be able to drive home after your procedure. Please ensure you have a responsible adult to drive you home. You will need someone with you for 24 hours post procedure.     - Expect to be in the procedural area approximately 2 hours.   - If you feel as if you go back into normal rhythm prior to scheduled cardioversion, please notify our office immediately.   If your procedure is canceled in the cardioversion suite you will be charged a cancellation fee.

## 2024-10-17 NOTE — Progress Notes (Signed)
 Primary Care Physician: [name] DASEN, MD Primary Cardiologist: Dorn Lesches, MD Electrophysiologist: Eulas FORBES Furbish, MD  Referring Physician: Dr Furbish Ozell LITTIE Mario [name] is a 78 y.o. male with a history of CAD, OSA, HTN, HLD, COPD, asthma, atrial fibrillation who presents for follow up in the [facility]. He was diagnosed with atrial fibrillation in 2019.  Prior to this he experience episodes of palpitations on and off for 2 or so years.  A monitor was placed that did document atrial fibrillation. He was admitted with atrial fibrillation and RVR in February 2024.  He was discharged with metoprolol  and taken off albuterol .  He was again admitted in April and May 2025 with atrial fibrillation RVR in the setting of COPD exacerbation.   He underwent DC cardioversion on May 14, 2024.  The patient believes that he converted back to atrial fibrillation the day following his cardioversion. Patient is on Eliquis  for stroke prevention. He was seen by Dr Furbish and underwent afib ablation on 09/19/24.   Patient presents today for follow up for atrial fibrillation. Unfortunately, he is back in afib. He believes he went back into afib about two days after his ablation. Of note, he was seen by his PCP and treated for acute exacerbation of bronchiectasis on 10/10. He denies chest pain or groin issues.   Today, he denies symptoms of palpitations, chest pain, orthopnea, PND, lower extremity edema, dizziness, presyncope, syncope, snoring, daytime somnolence, bleeding, or neurologic sequela. The patient is tolerating medications without difficulties and is otherwise without complaint today.    Atrial Fibrillation Risk Factors:  he does have symptoms or diagnosis of sleep apnea. he does not have a history of rheumatic fever.   Atrial Fibrillation Management history:  Previous antiarrhythmic drugs: none Previous cardioversions: 05/14/24 Previous ablations:  09/19/24 Anticoagulation history: Eliquis   ROS- All systems are reviewed and negative except as per the HPI above.  Past Medical History:  Diagnosis Date   Arrhythmia    Arthritis    Asthma    Atrial fibrillation (HCC)    Biliary colic 2014   Bronchiectasis (HCC)    2021   Cataract    COPD (chronic obstructive pulmonary disease) with chronic bronchitis (HCC)    Coronary artery calcification seen on CAT scan    Cyst of pancreas    1 cm (7/18), repeat ct in 12 months.     Hyperlipidemia    Hypertension    Obstructive sleep apnea    Renal disorder    Rhinitis    Sleep apnea    pt denies sleep apnea-lost weight    Current Outpatient Medications  Medication Sig Dispense Refill   acetaminophen  (TYLENOL ) 500 MG tablet Take 1,000 mg by mouth every 6 (six) hours as needed for mild pain (pain score 1-3).     alendronate  (FOSAMAX ) 70 MG tablet Take 1 tablet (70 mg total) by mouth every 7 (seven) days. Take with a full glass of water  on an empty stomach. 4 tablet 11   Ascorbic Acid (VITAMIN C) 1000 MG tablet Take 1,000 mg by mouth 2 (two) times daily.     budesonide -formoterol  (SYMBICORT ) 160-4.5 MCG/ACT inhaler Inhale 2 puffs into the lungs in the morning and at bedtime. Generic medication 1 each 6   Cholecalciferol (VITAMIN D3) 125 MCG (5000 UT) TABS Take 2 tablets by mouth daily.     Coenzyme Q10 (CO Q-10) 100 MG CAPS Take 1 capsule by mouth daily.  diltiazem  (DILACOR XR ) 240 MG 24 hr capsule Take 1 capsule (240 mg total) by mouth daily. 90 capsule 1   dupilumab  (DUPIXENT ) 300 MG/2ML prefilled syringe Inject 300 mg into the skin every 14 (fourteen) days. 4 mL 11   ELIQUIS  5 MG TABS tablet TAKE 1 TABLET BY MOUTH TWICE A DAY 60 tablet 5   FIBER COMPLETE PO Take 1 tablet by mouth daily.     guaiFENesin  (MUCINEX ) 600 MG 12 hr tablet Take 1 tablet (600 mg total) by mouth 2 (two) times daily. 60 tablet 0   Iron , Ferrous Sulfate , 325 (65 Fe) MG TABS Take 325 mg by mouth daily. 30 tablet 11    levalbuterol  (XOPENEX  HFA) 45 MCG/ACT inhaler Inhale 2 puffs into the lungs every 6 (six) hours as needed for wheezing. 1 each 12   levalbuterol  (XOPENEX ) 0.63 MG/3ML nebulizer solution Take 3 mLs (0.63 mg total) by nebulization 4 (four) times daily for 7 days, THEN 3 mLs (0.63 mg total) every 6 (six) hours as needed for wheezing or shortness of breath.     metoprolol  succinate (TOPROL -XL) 100 MG 24 hr tablet Take 1 tablet (100 mg total) by mouth daily. Take with or immediately following a meal. 90 tablet 3   montelukast  (SINGULAIR ) 10 MG tablet TAKE 1 TABLET BY MOUTH EVERY DAY 90 tablet 2   pantoprazole  (PROTONIX ) 40 MG tablet TAKE 1 TABLET BY MOUTH TWICE A DAY 180 tablet 2   Respiratory Therapy Supplies (FLUTTER) DEVI Use as directed 1 each 0   rosuvastatin  (CRESTOR ) 20 MG tablet TAKE 1 TABLET BY MOUTH EVERY DAY **D/C LIPITOR** 90 tablet 2   furosemide  (LASIX ) 40 MG tablet Take 1 tablet (40 mg total) by mouth daily. Take additional 20 mg if your weight is 3 lbs in 1 day of 5 lbs in 1 week (Patient not taking: Reported on 10/17/2024) 30 tablet 3   No current facility-administered medications for this encounter.    Physical Exam: BP 112/76   Pulse 96   Ht 5' 9 (1.753 m)   Wt 94.1 kg   BMI 30.63 kg/m   GEN: Well nourished, well developed in no acute distress CARDIAC: Irregularly irregular rate and rhythm, no murmurs, rubs, gallops RESPIRATORY:  Coarse expiratory breath sounds bilaterally ABDOMEN: Soft, non-tender, non-distended EXTREMITIES:  No edema; No deformity   Wt Readings from Last 3 Encounters:  10/17/24 94.1 kg  09/27/24 92.7 kg  09/19/24 95.3 kg     EKG today demonstrates  Afib Vent. rate 96 BPM PR interval * ms QRS duration 74 ms QT/QTcB 350/442 ms   Echo 04/16/24 demonstrated   1. Left ventricular ejection fraction, by estimation, is 50 to 55%. The  left ventricle has low normal function. Left ventricular endocardial  border not optimally defined to evaluate  regional wall motion. There is  mild concentric left ventricular hypertrophy. Left ventricular diastolic function could not be evaluated.   2. Right ventricular systolic function is low normal. The right  ventricular size is normal.   3. Left atrial size was moderately dilated.   4. The mitral valve is normal in structure. Trivial mitral valve  regurgitation. No evidence of mitral stenosis.   5. The aortic valve is grossly normal. There is mild calcification of the  aortic valve. There is mild thickening of the aortic valve. Aortic valve  regurgitation is not visualized. Aortic valve sclerosis/calcification is  present, without any evidence of  aortic stenosis.    CHA2DS2-VASc Score = 5  The  patient's score is based upon: CHF History: 1 HTN History: 1 Diabetes History: 0 Stroke History: 0 Vascular Disease History: 1 Age Score: 2 Gender Score: 0       ASSESSMENT AND PLAN: Persistent Atrial Fibrillation (ICD10:  I48.19) The patient's CHA2DS2-VASc score is 5, indicating a 7.2% annual risk of stroke.   S/p afib ablation 09/19/24 with quick return of afib. We discussed rhythm control options today including DCCV and dofetilide. Will proceed with DCCV alone. Check bmet/cbc today. If he has quick return of afib again, will consider dofetilide admission. Would avoid class IC with h/o CAD. Amiodarone not ideal with baseline lung issues.  Continue diltiazem  240 mg daily Continue Eliquis  5 mg BID with no missed doses for 3 months post ablation.  Continue Toprol  100 mg daily  Secondary Hypercoagulable State (ICD10:  D68.69) The patient is at significant risk for stroke/thromboembolism based upon his CHA2DS2-VASc Score of 5.  Continue Apixaban  (Eliquis ). No bleeding issues.   CAD CAC score 952 No anginal symptoms Followed by Dr Court  [facility]  Not currently on CPAP  HTN Stable on current regimen   Follow up in the AF clinic post DCCV.  Informed Consent   Shared Decision  Making/Informed Consent The risks (stroke, cardiac arrhythmias rarely resulting in the need for a temporary or permanent pacemaker, skin irritation or burns and complications associated with conscious sedation including aspiration, arrhythmia, respiratory failure and death), benefits (restoration of normal sinus rhythm) and alternatives of a direct current cardioversion were explained in detail to Mr. Hanisch and he agrees to proceed.       [facility] [place] [address] [place], [place] [address] [phone]

## 2024-10-18 ENCOUNTER — Ambulatory Visit (HOSPITAL_COMMUNITY): Payer: Self-pay | Admitting: Physician Assistant

## 2024-10-18 ENCOUNTER — Ambulatory Visit: Payer: Self-pay

## 2024-10-18 LAB — BASIC METABOLIC PANEL WITH GFR
BUN/Creatinine Ratio: 25 — ABNORMAL HIGH (ref 10–24)
BUN: 20 mg/dL (ref 8–27)
CO2: 22 mmol/L (ref 20–29)
Calcium: 9.1 mg/dL (ref 8.6–10.2)
Chloride: 103 mmol/L (ref 96–106)
Creatinine, Ser: 0.8 mg/dL (ref 0.76–1.27)
Glucose: 105 mg/dL — ABNORMAL HIGH (ref 70–99)
Potassium: 4.4 mmol/L (ref 3.5–5.2)
Sodium: 140 mmol/L (ref 134–144)
eGFR: 90 mL/min/1.73 (ref >59)

## 2024-10-18 LAB — CBC
Hematocrit: 43.4 % (ref 37.5–51.0)
Hemoglobin: 13.8 g/dL (ref 13.0–17.7)
MCH: 27.9 pg (ref 26.6–33.0)
MCHC: 31.8 g/dL (ref 31.5–35.7)
MCV: 88 fL (ref 79–97)
Platelets: 319 x10E3/uL (ref 150–450)
RBC: 4.94 x10E6/uL (ref 4.14–5.80)
RDW: 19.1 % — ABNORMAL HIGH (ref 11.6–15.4)
WBC: 10.8 x10E3/uL (ref 3.4–10.8)

## 2024-10-18 NOTE — Telephone Encounter (Signed)
 FYI Only or Action Required?: FYI only for provider: appointment scheduled on 10/21/2024.  Patient was last seen in primary care on 09/27/2024 by [name] DASEN, MD.  Called Nurse Triage reporting Shortness of Breath.  Symptoms began several weeks ago.  Interventions attempted: Prescription medications: Inhaler.  Symptoms are: unchanged.  Triage Disposition: See PCP When Office is Open (Within 3 Days)  Patient/caregiver understands and will follow disposition?: Yes        Copied from CRM [id number]. Topic: Clinical - Red Word Triage >> Oct 18, 2024  3:06 PM Montie POUR wrote: Red Word that prompted transfer to Nurse Triage:  Shortness of breath; he has asthma Reason for Disposition  [1] MODERATE longstanding difficulty breathing (e.g., speaks in phrases, SOB even at rest, pulse 100-120) AND [2] SAME as normal  Answer Assessment - Initial Assessment Questions Patient advised to go to ED for worsening symptoms.    1. RESPIRATORY STATUS: Describe your breathing? (e.g., wheezing, shortness of breath, unable to speak, severe coughing)      Shortness of breath  2. ONSET: When did this breathing problem begin?      A few weeks ago  3. PATTERN Does the difficult breathing come and go, or has it been constant since it started?      Comes and goes  4. SEVERITY: How bad is your breathing? (e.g., mild, moderate, severe)      Severe  5. RECURRENT SYMPTOM: Have you had difficulty breathing before? If Yes, ask: When was the last time? and What happened that time?      Yes  6. LUNG HISTORY: Do you have any history of lung disease?  (e.g., pulmonary embolus, asthma, emphysema)     Asthma  7 CAUSE: What do you think is causing the breathing problem?      Asthma issue  8. OTHER SYMPTOMS: Do you have any other symptoms? (e.g., chest pain, cough, dizziness, fever, runny nose)     Wheezing  9. O2 SATURATION MONITOR:  Do you use an oxygen saturation monitor (pulse  oximeter) at home? If Yes, ask: What is your reading (oxygen level) today? What is your usual oxygen saturation reading? (e.g., 95%)       Mid 90s   Used inhaler today about 5-6 times.  Protocols used: Breathing Difficulty-A-AH

## 2024-10-21 ENCOUNTER — Ambulatory Visit (HOSPITAL_BASED_OUTPATIENT_CLINIC_OR_DEPARTMENT_OTHER)
Admission: RE | Admit: 2024-10-21 | Discharge: 2024-10-21 | Disposition: A | Discharge status: Home / Self Care | Source: Ambulatory Visit | Attending: Family Medicine | Admitting: Family Medicine

## 2024-10-21 ENCOUNTER — Other Ambulatory Visit

## 2024-10-21 ENCOUNTER — Ambulatory Visit: Admitting: Family Medicine

## 2024-10-21 ENCOUNTER — Encounter: Payer: Self-pay | Admitting: Family Medicine

## 2024-10-21 VITALS — BP 126/72 | HR 112 | Temp 97.5°F | Resp 28 | Ht 69.0 in | Wt 205.0 lb

## 2024-10-21 DIAGNOSIS — J189 Pneumonia, unspecified organism: Secondary | ICD-10-CM | POA: Insufficient documentation

## 2024-10-21 DIAGNOSIS — E782 Mixed hyperlipidemia: Secondary | ICD-10-CM | POA: Diagnosis not present

## 2024-10-21 LAB — CBC WITH DIFFERENTIAL/PLATELET
Absolute Lymphocytes: 969 {cells}/uL (ref 850–3900)
Absolute Monocytes: 627 {cells}/uL (ref 200–950)
Basophils Absolute: 95 {cells}/uL (ref 0–200)
Basophils Relative: 1 %
Eosinophils Absolute: 76 {cells}/uL (ref 15–500)
Eosinophils Relative: 0.8 %
HCT: 44.9 % (ref 38.5–50.0)
Hemoglobin: 14.1 g/dL (ref 13.2–17.1)
MCH: 28.2 pg (ref 27.0–33.0)
MCHC: 31.4 g/dL — ABNORMAL LOW (ref 32.0–36.0)
MCV: 89.8 fL (ref 80.0–100.0)
MPV: 9.6 fL (ref 7.5–12.5)
Monocytes Relative: 6.6 %
Neutro Abs: 7733 {cells}/uL (ref 1500–7800)
Neutrophils Relative %: 81.4 %
Platelets: 392 Thousand/uL (ref 140–400)
RBC: 5 Million/uL (ref 4.20–5.80)
RDW: 19.2 % — ABNORMAL HIGH (ref 11.0–15.0)
Total Lymphocyte: 10.2 %
WBC: 9.5 Thousand/uL (ref 3.8–10.8)

## 2024-10-21 MED ORDER — LEVOFLOXACIN 500 MG PO TABS: 500.0000 mg | ORAL_TABLET | Freq: Every day | ORAL | 0 refills | Status: AC

## 2024-10-21 MED ORDER — METHYLPREDNISOLONE ACETATE 80 MG/ML IJ SUSP
80.0000 mg | Freq: Once | INTRAMUSCULAR | Status: AC
  Administered 2024-10-21: 80 mg via INTRAMUSCULAR

## 2024-10-21 MED ORDER — CEFTRIAXONE SODIUM 1 G IJ SOLR
1.0000 g | Freq: Once | INTRAMUSCULAR | Status: AC
  Administered 2024-10-21: 1 g via INTRAMUSCULAR

## 2024-10-21 MED ORDER — FUROSEMIDE 40 MG PO TABS: 40.0000 mg | ORAL_TABLET | Freq: Every day | ORAL | 3 refills | Status: AC

## 2024-10-21 MED ORDER — PREDNISONE 20 MG PO TABS: 60.0000 mg | ORAL_TABLET | Freq: Every day | ORAL | 0 refills | Status: DC

## 2024-10-21 MED ORDER — LEVALBUTEROL HCL 0.63 MG/3ML IN NEBU: INHALATION_SOLUTION | RESPIRATORY_TRACT | 3 refills | Status: AC

## 2024-10-21 MED ORDER — ROSUVASTATIN CALCIUM 20 MG PO TABS: ORAL_TABLET | ORAL | 2 refills | Status: AC

## 2024-10-21 NOTE — Progress Notes (Signed)
 Subjective:    Patient ID: Mario [name], male    DOB: 06/02/1946, 78 y.o.   MRN: [id number]  Patient is acutely ill.  He reports worsening shortness of breath.  This started last week.  He states that he can no longer walk more than 10 feet without having to stop to catch his breath.  He feels like he is suffocating at times.  He denies any fever but pulse oximetry is 90% on room air.  He is audibly wheezing today on exam.  He states that he is using Xopenex  5-6 times a day without any relief.  He also has a thick productive cough.  Today on exam, he has his chronic right basilar crackles however he also has prominent left basilar rales and crackles.  However there is no pitting edema in his legs.  His weight is unchanged.  Therefore I do not feel that the crackles are due to fluid retention but rather due to the pneumonia. Past Medical History:  Diagnosis Date   Arrhythmia    Arthritis    Asthma    Atrial fibrillation (HCC)    Biliary colic 2014   Bronchiectasis (HCC)    2021   Cataract    COPD (chronic obstructive pulmonary disease) with chronic bronchitis (HCC)    Coronary artery calcification seen on CAT scan    Cyst of pancreas    1 cm (7/18), repeat ct in 12 months.     Hyperlipidemia    Hypertension    Obstructive sleep apnea    Renal disorder    Rhinitis    Sleep apnea    pt denies sleep apnea-lost weight   Past Surgical History:  Procedure Laterality Date   ATRIAL FIBRILLATION ABLATION N/A 09/19/2024   Procedure: ATRIAL FIBRILLATION ABLATION;  Surgeon: Nancey Eulas BRAVO, MD;  [facility];  Service: Cardiovascular;  Laterality: N/A;   CARDIOVERSION N/A 05/14/2024   Procedure: CARDIOVERSION;  Surgeon: Michele Richardson, DO;  [facility]: [facility];  Service: Cardiovascular;  Laterality: N/A;   CHOLECYSTECTOMY     COLONOSCOPY  09/29/2014   INCISION AND DRAINAGE OF WOUND Left 03/13/2024   Procedure: IRRIGATION AND DEBRIDEMENT WOUND;  Surgeon: Shari Easter, MD;  Location: [facility];  Service: Orthopedics;  Laterality: Left;  IRRIGATION AND DEBRIDEMENT FIFTH FINGER LEFT   IR RADIOLOGIST EVAL & MGMT  09/12/2024   LAPAROSCOPIC CHOLECYSTECTOMY SINGLE SITE WITH INTRAOPERATIVE CHOLANGIOGRAM N/A 09/15/2019   Procedure: LAPAROSCOPIC CHOLECYSTECTOMY SINGLE SITE;  Surgeon: Sheldon Standing, MD;  Location: [facility];  Service: General;  Laterality: N/A;   LITHOTRIPSY  2000   for kidney stone   Current Outpatient Medications on File Prior to Visit  Medication Sig Dispense Refill   acetaminophen  (TYLENOL ) 500 MG tablet Take 1,000 mg by mouth every 6 (six) hours as needed for mild pain (pain score 1-3).     alendronate  (FOSAMAX ) 70 MG tablet Take 1 tablet (70 mg total) by mouth every 7 (seven) days. Take with a full glass of water  on an empty stomach. 4 tablet 11   Ascorbic Acid (VITAMIN C) 1000 MG tablet Take 1,000 mg by mouth 2 (two) times daily.     budesonide -formoterol  (SYMBICORT ) 160-4.5 MCG/ACT inhaler Inhale 2 puffs into the lungs in the morning and at bedtime. Generic medication 1 each 6   Cholecalciferol (VITAMIN D3) 125 MCG (5000 UT) TABS Take 2 tablets by mouth daily.     Coenzyme Q10 (CO Q-10) 100 MG CAPS Take 1 capsule  by mouth daily.     diltiazem  (DILACOR XR ) 240 MG 24 hr capsule Take 1 capsule (240 mg total) by mouth daily. 90 capsule 1   dupilumab  (DUPIXENT ) 300 MG/2ML prefilled syringe Inject 300 mg into the skin every 14 (fourteen) days. 4 mL 11   ELIQUIS  5 MG TABS tablet TAKE 1 TABLET BY MOUTH TWICE A DAY 60 tablet 5   FIBER COMPLETE PO Take 1 tablet by mouth daily.     guaiFENesin  (MUCINEX ) 600 MG 12 hr tablet Take 1 tablet (600 mg total) by mouth 2 (two) times daily. 60 tablet 0   Iron , Ferrous Sulfate , 325 (65 Fe) MG TABS Take 325 mg by mouth daily. 30 tablet 11   levalbuterol  (XOPENEX  HFA) 45 MCG/ACT inhaler Inhale 2 puffs into the lungs every 6 (six) hours as needed for wheezing. 1 each 12   metoprolol  succinate (TOPROL -XL) 100 MG 24 hr  tablet Take 1 tablet (100 mg total) by mouth daily. Take with or immediately following a meal. 90 tablet 3   montelukast  (SINGULAIR ) 10 MG tablet TAKE 1 TABLET BY MOUTH EVERY DAY 90 tablet 2   pantoprazole  (PROTONIX ) 40 MG tablet TAKE 1 TABLET BY MOUTH TWICE A DAY 180 tablet 2   Respiratory Therapy Supplies (FLUTTER) DEVI Use as directed 1 each 0   No current facility-administered medications on file prior to visit.   Allergies  Allergen Reactions   Dust Mite Extract Other (See Comments)    Asthma related   Social History   Socioeconomic History   Marital status: Married    Spouse name: Not on file   Number of children: Not on file   Years of education: Not on file   Highest education level: Bachelor's degree (e.g., BA, AB, BS)  Occupational History   Not on file  Tobacco Use   Smoking status: Former    Current packs/day: 0.00    Average packs/day: 2.0 packs/day for 11.0 years (22.0 ttl pk-yrs)    Types: Cigarettes    Start date: 12/20/1963    Quit date: 12/19/1974    Years since quitting: 49.8   Smokeless tobacco: Never  Vaping Use   Vaping status: Never Used  Substance and Sexual Activity   Alcohol use: Not Currently    Alcohol/week: 1.0 standard drink of alcohol   Drug use: No   Sexual activity: Not on file  Other Topics Concern   Not on file  Social History Narrative   Not on file   Social Drivers of Health   Financial Resource Strain: Medium Risk (10/19/2024)   Overall Financial Resource Strain (CARDIA)    Difficulty of Paying Living Expenses: Somewhat hard  Food Insecurity: No Food Insecurity (10/19/2024)   Hunger Vital Sign    Worried About Running Out of Food in the Last Year: Never true    Ran Out of Food in the Last Year: Never true  Transportation Needs: No Transportation Needs (10/19/2024)   PRAPARE - Administrator, Civil Service (Medical): No    Lack of Transportation (Non-Medical): No  Physical Activity: Inactive (10/19/2024)   Exercise  Vital Sign    Days of Exercise per Week: 0 days    Minutes of Exercise per Session: Not on file  Stress: No Stress Concern Present (10/19/2024)   [employer] of Occupational Health - Occupational Stress Questionnaire    Feeling of Stress: Only a little  Social Connections: Socially Integrated (10/19/2024)   Social Connection and Isolation Panel  Frequency of Communication with Friends and Family: More than three times a week    Frequency of Social Gatherings with Friends and Family: More than three times a week    Attends Religious Services: More than 4 times per year    Active Member of [employer] or Organizations: Yes    Attends Banker Meetings: 1 to 4 times per year    Marital Status: Married  Catering Manager Violence: Not At Risk (05/08/2024)   Humiliation, Afraid, Rape, and Kick questionnaire    Fear of Current or Ex-Partner: No    Emotionally Abused: No    Physically Abused: No    Sexually Abused: No      Review of Systems  Respiratory:  Positive for cough.   All other systems reviewed and are negative.      Objective:   Physical Exam Vitals reviewed.  Constitutional:      Appearance: He is well-developed.  HENT:     Head: Normocephalic and atraumatic.     Nose: Nose normal.     Mouth/Throat:     Pharynx: No oropharyngeal exudate.  Neck:     Vascular: No carotid bruit.  Cardiovascular:     Rate and Rhythm: Normal rate. Rhythm irregular.     Heart sounds: Normal heart sounds.  Pulmonary:     Effort: Pulmonary effort is normal. Tachypnea present. No respiratory distress.     Breath sounds: Decreased air movement present. No stridor. Examination of the right-upper field reveals wheezing. Examination of the left-upper field reveals wheezing. Examination of the left-middle field reveals rales. Examination of the right-lower field reveals rhonchi and rales. Examination of the left-lower field reveals rhonchi and rales. Wheezing, rhonchi and rales present.            Assessment & Plan:  Pneumonia of left lower lobe due to infectious organism - Plan: DG Chest 2 View, Brain natriuretic peptide, CBC with Differential/Platelet, cefTRIAXone  (ROCEPHIN ) injection 1 g, methylPREDNISolone  acetate (DEPO-MEDROL ) injection 80 mg  Mixed hyperlipidemia - Plan: rosuvastatin  (CRESTOR ) 20 MG tablet I believe the patient has likely developed a secondary pneumonia although I cannot exclude CHF exacerbation.  I feel that CHF exacerbation is unlikely due to the fact that there is no peripheral edema and his weight is essentially unchanged.  He has stopped Lasix  1 week ago.  I recommended he resume his basal dose of Lasix .  He received 80 mg of Depo-Medrol  IM x 1 now and 1 g of Rocephin  IM x 1 now.  Obtain chest x-ray along with CBC to evaluate for leukocytosis and BNP to evaluate for signs of fluid overload.  Meanwhile start Levaquin  500 mg daily for 7 days and also begin prednisone  60 mg daily for 5 days.  Recheck in 48 hours or go to the emergency room if worsening.

## 2024-10-22 ENCOUNTER — Ambulatory Visit: Payer: Self-pay | Admitting: Family Medicine

## 2024-10-22 LAB — BRAIN NATRIURETIC PEPTIDE: Brain Natriuretic Peptide: 188 pg/mL — ABNORMAL HIGH (ref <100)

## 2024-10-23 ENCOUNTER — Telehealth: Payer: Self-pay | Admitting: Cardiovascular Disease

## 2024-10-23 ENCOUNTER — Other Ambulatory Visit (HOSPITAL_COMMUNITY): Payer: Self-pay | Admitting: *Deleted

## 2024-10-23 NOTE — Telephone Encounter (Signed)
 Patient calling to r/s his procedure on 11/12. Please advise

## 2024-10-23 NOTE — Telephone Encounter (Signed)
 Patient currently being treated for pneumonia; will move out cardioversion to 11/19 arrival at 730am. Pt will call if he does not feel recovered from illness to proceed at that time.

## 2024-10-24 ENCOUNTER — Encounter: Payer: Self-pay | Admitting: Family Medicine

## 2024-10-24 ENCOUNTER — Ambulatory Visit (INDEPENDENT_AMBULATORY_CARE_PROVIDER_SITE_OTHER): Admitting: Family Medicine

## 2024-10-24 ENCOUNTER — Other Ambulatory Visit (HOSPITAL_BASED_OUTPATIENT_CLINIC_OR_DEPARTMENT_OTHER): Payer: Self-pay

## 2024-10-24 DIAGNOSIS — J189 Pneumonia, unspecified organism: Secondary | ICD-10-CM

## 2024-10-24 HISTORY — DX: Pneumonia, unspecified organism: J18.9

## 2024-10-24 MED ORDER — DILTIAZEM HCL ER 240 MG PO CP24
240.0000 mg | ORAL_CAPSULE | Freq: Every day | ORAL | 1 refills | Status: AC
  Filled 2024-10-24: qty 90, 90d supply, fill #0

## 2024-10-24 MED ORDER — BREZTRI AEROSPHERE 160-9-4.8 MCG/ACT IN AERO: 2.0000 | INHALATION_SPRAY | Freq: Two times a day (BID) | RESPIRATORY_TRACT | 11 refills | Status: DC

## 2024-10-24 MED ORDER — PREDNISONE 20 MG PO TABS
40.0000 mg | ORAL_TABLET | Freq: Every day | ORAL | 0 refills | Status: DC
  Filled 2024-10-24: qty 21, 10d supply, fill #0

## 2024-10-24 NOTE — Progress Notes (Signed)
 Subjective:    Patient ID: Mario [name], male    DOB: 1946-06-16, 78 y.o.   MRN: [id number] 10/21/24 Patient is acutely ill.  He reports worsening shortness of breath.  This started last week.  He states that he can no longer walk more than 10 feet without having to stop to catch his breath.  He feels like he is suffocating at times.  He denies any fever but pulse oximetry is 90% on room air.  He is audibly wheezing today on exam.  He states that he is using Xopenex  5-6 times a day without any relief.  He also has a thick productive cough.  Today on exam, he has his chronic right basilar crackles however he also has prominent left basilar rales and crackles.  However there is no pitting edema in his legs.  His weight is unchanged.  Therefore I do not feel that the crackles are due to fluid retention but rather due to the pneumonia.  At that time, my plan was: I believe the patient has likely developed a secondary pneumonia although I cannot exclude CHF exacerbation.  I feel that CHF exacerbation is unlikely due to the fact that there is no peripheral edema and his weight is essentially unchanged.  He has stopped Lasix  1 week ago.  I recommended he resume his basal dose of Lasix .  He received 80 mg of Depo-Medrol  IM x 1 now and 1 g of Rocephin  IM x 1 now.  Obtain chest x-ray along with CBC to evaluate for leukocytosis and BNP to evaluate for signs of fluid overload.  Meanwhile start Levaquin  500 mg daily for 7 days and also begin prednisone  60 mg daily for 5 days.  Recheck in 48 hours or go to the emergency room if worsening.  10/24/24 Radiology has not yet read his x-ray however there is a left-sided pleural effusion.  I do not appreciate any left-sided infiltrate per my read.  Patient states that he is doing better.  His wheezing has improved dramatically on the prednisone .  There is definitely more air movement today on exam and he is breathing easier.  His tachypnea has improved.  There are still  bibasilar crackles.  I believe the right side is his chronic bronchiectasis but the left side I believe is due to the effusion and likely an underlying pneumonia.  I believe that the effusion is likely parapneumonic.  Oxygen saturation has improved from 90 to 94%. Past Medical History:  Diagnosis Date   Arrhythmia    Arthritis    Asthma    Atrial fibrillation (HCC)    Biliary colic 2014   Bronchiectasis (HCC)    2021   Cataract    COPD (chronic obstructive pulmonary disease) with chronic bronchitis (HCC)    Coronary artery calcification seen on CAT scan    Cyst of pancreas    1 cm (7/18), repeat ct in 12 months.     Hyperlipidemia    Hypertension    Obstructive sleep apnea    Renal disorder    Rhinitis    Sleep apnea    pt denies sleep apnea-lost weight   Past Surgical History:  Procedure Laterality Date   ATRIAL FIBRILLATION ABLATION N/A 09/19/2024   Procedure: ATRIAL FIBRILLATION ABLATION;  Surgeon: Nancey Eulas BRAVO, MD;  [facility];  Service: Cardiovascular;  Laterality: N/A;   CARDIOVERSION N/A 05/14/2024   Procedure: CARDIOVERSION;  Surgeon: Michele Richardson, DO;  [facility];  Service:  Cardiovascular;  Laterality: N/A;   CHOLECYSTECTOMY     COLONOSCOPY  09/29/2014   INCISION AND DRAINAGE OF WOUND Left 03/13/2024   Procedure: IRRIGATION AND DEBRIDEMENT WOUND;  Surgeon: Shari Easter, MD;  Location: [facility] OR;  Service: Orthopedics;  Laterality: Left;  IRRIGATION AND DEBRIDEMENT FIFTH FINGER LEFT   IR RADIOLOGIST EVAL & MGMT  09/12/2024   LAPAROSCOPIC CHOLECYSTECTOMY SINGLE SITE WITH INTRAOPERATIVE CHOLANGIOGRAM N/A 09/15/2019   Procedure: LAPAROSCOPIC CHOLECYSTECTOMY SINGLE SITE;  Surgeon: Sheldon Standing, MD;  Location: [facility];  Service: General;  Laterality: N/A;   LITHOTRIPSY  2000   for kidney stone   Current Outpatient Medications on File Prior to Visit  Medication Sig Dispense Refill   acetaminophen  (TYLENOL ) 500 MG tablet Take 1,000 mg by  mouth every 6 (six) hours as needed for mild pain (pain score 1-3).     alendronate  (FOSAMAX ) 70 MG tablet Take 1 tablet (70 mg total) by mouth every 7 (seven) days. Take with a full glass of water  on an empty stomach. 4 tablet 11   Ascorbic Acid (VITAMIN C) 1000 MG tablet Take 1,000 mg by mouth 2 (two) times daily.     Cholecalciferol (VITAMIN D3) 125 MCG (5000 UT) TABS Take 2 tablets by mouth daily.     Coenzyme Q10 (CO Q-10) 100 MG CAPS Take 1 capsule by mouth daily.     dupilumab  (DUPIXENT ) 300 MG/2ML prefilled syringe Inject 300 mg into the skin every 14 (fourteen) days. 4 mL 11   ELIQUIS  5 MG TABS tablet TAKE 1 TABLET BY MOUTH TWICE A DAY 60 tablet 5   FIBER COMPLETE PO Take 1 tablet by mouth daily.     furosemide  (LASIX ) 40 MG tablet Take 1 tablet (40 mg total) by mouth daily. Take additional 20 mg if your weight is 3 lbs in 1 day of 5 lbs in 1 week 30 tablet 3   guaiFENesin  (MUCINEX ) 600 MG 12 hr tablet Take 1 tablet (600 mg total) by mouth 2 (two) times daily. 60 tablet 0   Iron , Ferrous Sulfate , 325 (65 Fe) MG TABS Take 325 mg by mouth daily. 30 tablet 11   levalbuterol  (XOPENEX  HFA) 45 MCG/ACT inhaler Inhale 2 puffs into the lungs every 6 (six) hours as needed for wheezing. 1 each 12   levalbuterol  (XOPENEX ) 0.63 MG/3ML nebulizer solution Take 3 mLs (0.63 mg total) by nebulization 4 (four) times daily for 7 days, THEN 3 mLs (0.63 mg total) every 6 (six) hours as needed for wheezing or shortness of breath. 3 mL 3   levofloxacin  (LEVAQUIN ) 500 MG tablet Take 1 tablet (500 mg total) by mouth daily for 7 days. 7 tablet 0   metoprolol  succinate (TOPROL -XL) 100 MG 24 hr tablet Take 1 tablet (100 mg total) by mouth daily. Take with or immediately following a meal. 90 tablet 3   montelukast  (SINGULAIR ) 10 MG tablet TAKE 1 TABLET BY MOUTH EVERY DAY 90 tablet 2   pantoprazole  (PROTONIX ) 40 MG tablet TAKE 1 TABLET BY MOUTH TWICE A DAY 180 tablet 2   Respiratory Therapy Supplies (FLUTTER) DEVI Use as  directed 1 each 0   rosuvastatin  (CRESTOR ) 20 MG tablet TAKE 1 TABLET BY MOUTH EVERY DAY **D/C LIPITOR** 90 tablet 2   No current facility-administered medications on file prior to visit.   Allergies  Allergen Reactions   Dust Mite Extract Other (See Comments)    Asthma related   Social History   Socioeconomic History   Marital status: Married  Spouse name: Not on file   Number of children: Not on file   Years of education: Not on file   Highest education level: Bachelor's degree (e.g., BA, AB, BS)  Occupational History   Not on file  Tobacco Use   Smoking status: Former    Current packs/day: 0.00    Average packs/day: 2.0 packs/day for 11.0 years (22.0 ttl pk-yrs)    Types: Cigarettes    Start date: 12/20/1963    Quit date: 12/19/1974    Years since quitting: 49.8   Smokeless tobacco: Never  Vaping Use   Vaping status: Never Used  Substance and Sexual Activity   Alcohol use: Not Currently    Alcohol/week: 1.0 standard drink of alcohol   Drug use: No   Sexual activity: Not on file  Other Topics Concern   Not on file  Social History Narrative   Not on file   Social Drivers of Health   Financial Resource Strain: Medium Risk (10/19/2024)   Overall Financial Resource Strain (CARDIA)    Difficulty of Paying Living Expenses: Somewhat hard  Food Insecurity: No Food Insecurity (10/19/2024)   Hunger Vital Sign    Worried About Running Out of Food in the Last Year: Never true    Ran Out of Food in the Last Year: Never true  Transportation Needs: No Transportation Needs (10/19/2024)   PRAPARE - Administrator, Civil Service (Medical): No    Lack of Transportation (Non-Medical): No  Physical Activity: Inactive (10/19/2024)   Exercise Vital Sign    Days of Exercise per Week: 0 days    Minutes of Exercise per Session: Not on file  Stress: No Stress Concern Present (10/19/2024)   [employer] of Occupational Health - Occupational Stress Questionnaire    Feeling  of Stress: Only a little  Social Connections: Socially Integrated (10/19/2024)   Social Connection and Isolation Panel    Frequency of Communication with Friends and Family: More than three times a week    Frequency of Social Gatherings with Friends and Family: More than three times a week    Attends Religious Services: More than 4 times per year    Active Member of [employer] or Organizations: Yes    Attends Banker Meetings: 1 to 4 times per year    Marital Status: Married  Catering Manager Violence: Not At Risk (05/08/2024)   Humiliation, Afraid, Rape, and Kick questionnaire    Fear of Current or Ex-Partner: No    Emotionally Abused: No    Physically Abused: No    Sexually Abused: No      Review of Systems  Respiratory:  Positive for cough.   All other systems reviewed and are negative.      Objective:   Physical Exam Vitals reviewed.  Constitutional:      Appearance: He is well-developed.  HENT:     Head: Normocephalic and atraumatic.     Nose: Nose normal.     Mouth/Throat:     Pharynx: No oropharyngeal exudate.  Neck:     Vascular: No carotid bruit.  Cardiovascular:     Rate and Rhythm: Normal rate. Rhythm irregular.     Heart sounds: Normal heart sounds.  Pulmonary:     Effort: Pulmonary effort is normal. No tachypnea or respiratory distress.     Breath sounds: No stridor or decreased air movement. Examination of the left-middle field reveals rales. Examination of the right-lower field reveals rhonchi and rales. Examination of the left-lower  field reveals rhonchi and rales. Rhonchi and rales present. No wheezing.           Assessment & Plan:  Pneumonia of left lower lobe due to infectious organism - Plan: diltiazem  (DILACOR XR ) 240 MG 24 hr capsule I believe the patient had a COPD exacerbation coupled with a left lower lobe pneumonia and a left parapneumonic effusion.  Finished Levaquin .  Wean down prednisone  to 40 mg a day for 1 week and then 20 mg a  day for 1 week and then discontinue.  Use Xopenex  as needed for wheezing.  Continue Lasix  40 mg a day.  Recheck next week.  Meanwhile switch Symbicort  to Breztri  2 puffs inhaled twice daily to maximize therapy.

## 2024-10-26 ENCOUNTER — Encounter: Payer: Self-pay | Admitting: Family Medicine

## 2024-10-28 ENCOUNTER — Other Ambulatory Visit: Payer: Self-pay | Admitting: Family Medicine

## 2024-10-28 ENCOUNTER — Other Ambulatory Visit: Payer: Self-pay

## 2024-10-28 ENCOUNTER — Telehealth: Payer: Self-pay

## 2024-10-28 MED ORDER — PREDNISONE 20 MG PO TABS: ORAL_TABLET | ORAL | 0 refills | Status: DC

## 2024-10-28 NOTE — Telephone Encounter (Signed)
 Copied from CRM [id number]. Topic: Clinical - Prescription Issue >> Oct 28, 2024  9:17 AM Lonell PEDLAR wrote: Reason for CRM: patient stated that rx for predniSONE  (DELTASONE ) 20 MG tablet should have been sent to the below pharmacy.  [id number] [place] - [address] [place] [address] Phone: [phone] Fax: [phone]

## 2024-10-29 ENCOUNTER — Other Ambulatory Visit: Payer: Self-pay | Admitting: Family Medicine

## 2024-10-31 ENCOUNTER — Ambulatory Visit: Admitting: Family Medicine

## 2024-10-31 ENCOUNTER — Encounter: Payer: Self-pay | Admitting: Family Medicine

## 2024-10-31 ENCOUNTER — Telehealth: Payer: Self-pay

## 2024-10-31 VITALS — BP 118/64 | HR 104 | Temp 97.7°F | Ht 69.0 in | Wt 201.0 lb

## 2024-10-31 DIAGNOSIS — J9691 Respiratory failure, unspecified with hypoxia: Secondary | ICD-10-CM | POA: Diagnosis not present

## 2024-10-31 DIAGNOSIS — J4489 Other specified chronic obstructive pulmonary disease: Secondary | ICD-10-CM

## 2024-10-31 NOTE — Telephone Encounter (Signed)
 Requested medications are due for refill today.  no  Requested medications are on the active medications list.  yes  Last refill. 10/28/2024 #21 0 rf  Future visit scheduled.   no  Notes to clinic.  Pt was to discontinue when finished with prescription. Refusal not delegated.    Requested Prescriptions  Pending Prescriptions Disp Refills   predniSONE  (DELTASONE ) 5 MG tablet [Pharmacy Med Name: PREDNISONE  5 MG TABLET] 30 tablet 1    Sig: TAKE 1 TABLET BY MOUTH EVERY DAY WITH BREAKFAST     Not Delegated - Endocrinology:  Oral Corticosteroids Failed - 10/31/2024 10:10 AM      Failed - This refill cannot be delegated      Failed - Manual Review: Eye exam for IOP if prolonged treatment      Failed - Glucose (serum) in normal range and within 180 days    Glucose  Date Value Ref Range Status  10/17/2024 105 (H) 70 - 99 mg/dL Final   Glucose, Bld  Date Value Ref Range Status  05/07/2024 106 (H) 65 - 99 mg/dL Final    Comment:    .            Fasting reference interval . For someone without known diabetes, a glucose value between 100 and 125 mg/dL is consistent with prediabetes and should be confirmed with a follow-up test. .          Passed - K in normal range and within 180 days    Potassium  Date Value Ref Range Status  10/17/2024 4.4 3.5 - 5.2 mmol/L Final         Passed - Na in normal range and within 180 days    Sodium  Date Value Ref Range Status  10/17/2024 140 134 - 144 mmol/L Final         Passed - Last BP in normal range    BP Readings from Last 1 Encounters:  10/31/24 118/64         Passed - Valid encounter within last 6 months    Recent Outpatient Visits           Today COPD (chronic obstructive pulmonary disease) with chronic bronchitis (HCC)   [place] [facility] [name] DASEN, MD   1 week ago Pneumonia of left lower lobe due to infectious organism   [place] [facility] [name] DASEN, MD   1  week ago Pneumonia of left lower lobe due to infectious organism   [place] [facility] Family Medicine [name] DASEN, MD   1 month ago Bronchiectasis with (acute) exacerbation [facility])   [place] [facility] Pickard, Butler DASEN, MD   1 month ago Osteopenia, unspecified location   [place] [facility] Family Medicine Pickard, Butler DASEN, MD       Future Appointments             In 1 month Aniceto Daphne CROME, NP [facility] Dept of [employer] [facility] [employer], [facility]   In 1 month Dartha Ernst, MD [facility] Endocrinology            Passed - Bone Mineral Density or Dexa Scan completed in the last 2 years

## 2024-10-31 NOTE — Progress Notes (Signed)
 Subjective:    Patient ID: Mario [name], male    DOB: [birth year], 78 y.o.   MRN: [id number] 10/21/24 Patient is acutely ill.  He reports worsening shortness of breath.  This started last week.  He states that he can no longer walk more than 10 feet without having to stop to catch his breath.  He feels like he is suffocating at times.  He denies any fever but pulse oximetry is 90% on room air.  He is audibly wheezing today on exam.  He states that he is using Xopenex  5-6 times a day without any relief.  He also has a thick productive cough.  Today on exam, he has his chronic right basilar crackles however he also has prominent left basilar rales and crackles.  However there is no pitting edema in his legs.  His weight is unchanged.  Therefore I do not feel that the crackles are due to fluid retention but rather due to the pneumonia.  At that time, my plan was: I believe the patient has likely developed a secondary pneumonia although I cannot exclude CHF exacerbation.  I feel that CHF exacerbation is unlikely due to the fact that there is no peripheral edema and his weight is essentially unchanged.  He has stopped Lasix  1 week ago.  I recommended he resume his basal dose of Lasix .  He received 80 mg of Depo-Medrol  IM x 1 now and 1 g of Rocephin  IM x 1 now.  Obtain chest x-ray along with CBC to evaluate for leukocytosis and BNP to evaluate for signs of fluid overload.  Meanwhile start Levaquin  500 mg daily for 7 days and also begin prednisone  60 mg daily for 5 days.  Recheck in 48 hours or go to the emergency room if worsening.  10/24/24 Radiology has not yet read his x-ray however there is a left-sided pleural effusion.  I do not appreciate any left-sided infiltrate per my read.  Patient states that he is doing better.  His wheezing has improved dramatically on the prednisone .  There is definitely more air movement today on exam and he is breathing easier.  His tachypnea has improved.  There are still  bibasilar crackles.  I believe the right side is his chronic bronchiectasis but the left side I believe is due to the effusion and likely an underlying pneumonia.  I believe that the effusion is likely parapneumonic.  Oxygen saturation has improved from 90 to 94%.  At that time, my plan was: I believe the patient had a COPD exacerbation coupled with a left lower lobe pneumonia and a left parapneumonic effusion.  Finished Levaquin .  Wean down prednisone  to 40 mg a day for 1 week and then 20 mg a day for 1 week and then discontinue.  Use Xopenex  as needed for wheezing.  Continue Lasix  40 mg a day.  Recheck next week.  Meanwhile switch Symbicort  to Breztri  2 puffs inhaled twice daily to maximize therapy.  10/31/24 Patient was finally able to start prednisone  40 mg a day on Monday due to a mixup at the pharmacy.  He states that he is feeling better now.  His wheezing has improved.  The crackles in his left lung have also improved.  However he continues to have severe dyspnea on exertion and severe fatigue.  He denies any fevers or chills.  With ambulation today, on room air, his oxygen drops to 88%.  He improved to 95% with oxygen 2 L via nasal cannula with ambulation.  Due to frequent COPD exacerbations, the patient has become deconditioned.  He is having a difficult time walking without a walker.  He is extremely short of breath even with minimal activity.  I feel the patient would benefit from oxygen.  He has still not got Breztri .  It is being delivered today.  He is currently on Symbicort  Past Medical History:  Diagnosis Date   Arrhythmia    Arthritis    Asthma    Atrial fibrillation (HCC)    Biliary colic 2014   Bronchiectasis (HCC)    2021   Cataract    COPD (chronic obstructive pulmonary disease) with chronic bronchitis (HCC)    Coronary artery calcification seen on CAT scan    Cyst of pancreas    1 cm (7/18), repeat ct in 12 months.     Hyperlipidemia    Hypertension    Obstructive sleep  apnea    Renal disorder    Rhinitis    Sleep apnea    pt denies sleep apnea-lost weight   Past Surgical History:  Procedure Laterality Date   ATRIAL FIBRILLATION ABLATION N/A 09/19/2024   Procedure: ATRIAL FIBRILLATION ABLATION;  Surgeon: Nancey Eulas BRAVO, MD;  Location: [facility];  Service: Cardiovascular;  Laterality: N/A;   CARDIOVERSION N/A 05/14/2024   Procedure: CARDIOVERSION;  Surgeon: Michele Richardson, DO;  Location: [facility];  Service: Cardiovascular;  Laterality: N/A;   CHOLECYSTECTOMY     COLONOSCOPY  09/29/2014   INCISION AND DRAINAGE OF WOUND Left 03/13/2024   Procedure: IRRIGATION AND DEBRIDEMENT WOUND;  Surgeon: Shari Easter, MD;  Location: [facility];  Service: Orthopedics;  Laterality: Left;  IRRIGATION AND DEBRIDEMENT FIFTH FINGER LEFT   IR RADIOLOGIST EVAL & MGMT  09/12/2024   LAPAROSCOPIC CHOLECYSTECTOMY SINGLE SITE WITH INTRAOPERATIVE CHOLANGIOGRAM N/A 09/15/2019   Procedure: LAPAROSCOPIC CHOLECYSTECTOMY SINGLE SITE;  Surgeon: Sheldon Standing, MD;  Location: [facility];  Service: General;  Laterality: N/A;   LITHOTRIPSY  2000   for kidney stone   Current Outpatient Medications on File Prior to Visit  Medication Sig Dispense Refill   acetaminophen  (TYLENOL ) 500 MG tablet Take 1,000 mg by mouth every 6 (six) hours as needed for mild pain (pain score 1-3).     alendronate  (FOSAMAX ) 70 MG tablet Take 1 tablet (70 mg total) by mouth every 7 (seven) days. Take with a full glass of water  on an empty stomach. 4 tablet 11   Ascorbic Acid (VITAMIN C) 1000 MG tablet Take 1,000 mg by mouth 2 (two) times daily.     budesonide -glycopyrrolate-formoterol  (BREZTRI  AEROSPHERE) 160-9-4.8 MCG/ACT AERO inhaler Inhale 2 puffs into the lungs 2 (two) times daily. 10.7 g 11   Cholecalciferol (VITAMIN D3) 125 MCG (5000 UT) TABS Take 2 tablets by mouth daily.     Coenzyme Q10 (CO Q-10) 100 MG CAPS Take 1 capsule by mouth daily.     diltiazem  (DILACOR XR ) 240 MG 24 hr capsule Take 1  capsule (240 mg total) by mouth daily. 90 capsule 1   dupilumab  (DUPIXENT ) 300 MG/2ML prefilled syringe Inject 300 mg into the skin every 14 (fourteen) days. 4 mL 11   ELIQUIS  5 MG TABS tablet TAKE 1 TABLET BY MOUTH TWICE A DAY 60 tablet 5   FIBER COMPLETE PO Take 1 tablet by mouth daily.     furosemide  (LASIX ) 40 MG tablet Take 1 tablet (40 mg total) by mouth daily. Take additional 20 mg if your weight is 3 lbs in 1 day of 5  lbs in 1 week 30 tablet 3   guaiFENesin  (MUCINEX ) 600 MG 12 hr tablet Take 1 tablet (600 mg total) by mouth 2 (two) times daily. 60 tablet 0   Iron , Ferrous Sulfate , 325 (65 Fe) MG TABS Take 325 mg by mouth daily. 30 tablet 11   levalbuterol  (XOPENEX  HFA) 45 MCG/ACT inhaler Inhale 2 puffs into the lungs every 6 (six) hours as needed for wheezing. 1 each 12   levalbuterol  (XOPENEX ) 0.63 MG/3ML nebulizer solution Take 3 mLs (0.63 mg total) by nebulization 4 (four) times daily for 7 days, THEN 3 mLs (0.63 mg total) every 6 (six) hours as needed for wheezing or shortness of breath. 3 mL 3   metoprolol  succinate (TOPROL -XL) 100 MG 24 hr tablet Take 1 tablet (100 mg total) by mouth daily. Take with or immediately following a meal. 90 tablet 3   montelukast  (SINGULAIR ) 10 MG tablet TAKE 1 TABLET BY MOUTH EVERY DAY 90 tablet 2   pantoprazole  (PROTONIX ) 40 MG tablet TAKE 1 TABLET BY MOUTH TWICE A DAY 180 tablet 2   predniSONE  (DELTASONE ) 20 MG tablet 40 mg a day for 1 week and then 20 mg a day for 1 week and then discontinue 21 tablet 0   Respiratory Therapy Supplies (FLUTTER) DEVI Use as directed 1 each 0   rosuvastatin  (CRESTOR ) 20 MG tablet TAKE 1 TABLET BY MOUTH EVERY DAY **D/C LIPITOR** 90 tablet 2   No current facility-administered medications on file prior to visit.   Allergies  Allergen Reactions   Dust Mite Extract Other (See Comments)    Asthma related   Social History   Socioeconomic History   Marital status: Married    Spouse name: Not on file   Number of  children: Not on file   Years of education: Not on file   Highest education level: Bachelor's degree (e.g., BA, AB, BS)  Occupational History   Not on file  Tobacco Use   Smoking status: Former    Current packs/day: 0.00    Average packs/day: 2.0 packs/day for 11.0 years (22.0 ttl pk-yrs)    Types: Cigarettes    Start date: 12/20/1963    Quit date: 12/19/1974    Years since quitting: 49.9   Smokeless tobacco: Never  Vaping Use   Vaping status: Never Used  Substance and Sexual Activity   Alcohol use: Not Currently    Alcohol/week: 1.0 standard drink of alcohol   Drug use: No   Sexual activity: Not on file  Other Topics Concern   Not on file  Social History Narrative   Not on file   Social Drivers of Health   Financial Resource Strain: Medium Risk (10/19/2024)   Overall Financial Resource Strain (CARDIA)    Difficulty of Paying Living Expenses: Somewhat hard  Food Insecurity: No Food Insecurity (10/19/2024)   Hunger Vital Sign    Worried About Running Out of Food in the Last Year: Never true    Ran Out of Food in the Last Year: Never true  Transportation Needs: No Transportation Needs (10/19/2024)   PRAPARE - Administrator, Civil Service (Medical): No    Lack of Transportation (Non-Medical): No  Physical Activity: Inactive (10/19/2024)   Exercise Vital Sign    Days of Exercise per Week: 0 days    Minutes of Exercise per Session: Not on file  Stress: No Stress Concern Present (10/19/2024)   [employer] of Occupational Health - Occupational Stress Questionnaire    Feeling of Stress:  Only a little  Social Connections: Socially Integrated (10/19/2024)   Social Connection and Isolation Panel    Frequency of Communication with Friends and Family: More than three times a week    Frequency of Social Gatherings with Friends and Family: More than three times a week    Attends Religious Services: More than 4 times per year    Active Member of [employer] or Organizations: Yes     Attends Banker Meetings: 1 to 4 times per year    Marital Status: Married  Catering Manager Violence: Not At Risk (05/08/2024)   Humiliation, Afraid, Rape, and Kick questionnaire    Fear of Current or Ex-Partner: No    Emotionally Abused: No    Physically Abused: No    Sexually Abused: No      Review of Systems  Respiratory:  Positive for cough.   All other systems reviewed and are negative.      Objective:   Physical Exam Vitals reviewed.  Constitutional:      Appearance: He is well-developed.  HENT:     Head: Normocephalic and atraumatic.     Nose: Nose normal.     Mouth/Throat:     Pharynx: No oropharyngeal exudate.  Neck:     Vascular: No carotid bruit.  Cardiovascular:     Rate and Rhythm: Normal rate. Rhythm irregular.     Heart sounds: Normal heart sounds.  Pulmonary:     Effort: Pulmonary effort is normal. No tachypnea or respiratory distress.     Breath sounds: Decreased air movement present. No stridor. Examination of the right-lower field reveals rales. Rales present. No wheezing or rhonchi.           Assessment & Plan:  COPD (chronic obstructive pulmonary disease) with chronic bronchitis (HCC) - Plan: AMB referral to pulmonary rehabilitation, For home use only DME oxygen  Respiratory failure with hypoxia, unspecified chronicity [facility]) Patient has a follow-up appointment with pulmonology.  He has been on Dupixent  but has seen no benefit.  I have recommended switching from Symbicort  to Breztri  2 puffs twice daily to see if this will help with his chronic shortness of breath.  Continue 40 mg of prednisone  x 1 week and then wean down to 20 mg of prednisone  x 1 week then 10 mg of prednisone  daily x 1 week then stop.  I will try to get the patient scheduled for pulmonary rehab to help with his deconditioning.  I will also start him on oxygen 2 L via nasal cannula with activity.  Today he is 95% on room air.  With ambulation of only 60 feet, his  oxygen drops to 88% and he has to stop and take a break.  With oxygen to his ambulatory pulse oximetry is back to 95%.  He would benefit from 2 L via nasal cannula daily.  Follow-up with pulmonology for any other suggestions they may have

## 2024-10-31 NOTE — Telephone Encounter (Signed)
 Copied from CRM [id number]. Topic: Clinical - Order For Equipment >> Oct 31, 2024  4:44 PM Delon HERO wrote: Reason for CRM: Jerel calling from [facility] is calling to report received an order for oxygen. Patient's insurance is out of network. CB- [phone]

## 2024-11-01 ENCOUNTER — Other Ambulatory Visit

## 2024-11-04 NOTE — Telephone Encounter (Signed)
 Copied from CRM #[id number]. Topic: Clinical - Order For Equipment >> Nov 04, 2024  1:19 PM Lauren C wrote: Reason for CRM: Norene Ligas with [facility] would like a call back from Ronal Bradley regarding the oxygen referral. They are having some trouble finding an in network provider for pt due to him having no OON benefits with [facility]. #[phone]

## 2024-11-07 ENCOUNTER — Ambulatory Visit (HOSPITAL_COMMUNITY): Admitting: Physician Assistant

## 2024-11-11 ENCOUNTER — Ambulatory Visit

## 2024-11-11 ENCOUNTER — Encounter: Payer: Self-pay | Admitting: Pulmonary Disease

## 2024-11-11 ENCOUNTER — Ambulatory Visit: Admitting: Pulmonary Disease

## 2024-11-11 VITALS — BP 118/89 | HR 82 | Ht 68.5 in | Wt 201.0 lb

## 2024-11-11 DIAGNOSIS — J4489 Other specified chronic obstructive pulmonary disease: Secondary | ICD-10-CM | POA: Diagnosis not present

## 2024-11-11 DIAGNOSIS — K449 Diaphragmatic hernia without obstruction or gangrene: Secondary | ICD-10-CM | POA: Diagnosis not present

## 2024-11-11 DIAGNOSIS — J479 Bronchiectasis, uncomplicated: Secondary | ICD-10-CM

## 2024-11-11 LAB — PULMONARY FUNCTION TEST
DL/VA % pred: 93 %
DL/VA: 3.67 ml/min/mmHg/L
DLCO cor % pred: 50 %
DLCO cor: 11.76 ml/min/mmHg
DLCO unc % pred: 49 %
DLCO unc: 11.59 ml/min/mmHg
FEF 25-75 Pre: 0.43 L/s
FEF2575-%Pred-Pre: 22 %
FEV1-%Pred-Pre: 38 %
FEV1-Pre: 1.06 L
FEV1FVC-%Pred-Pre: 76 %
FEV6-%Pred-Pre: 51 %
FEV6-Pre: 1.84 L
FEV6FVC-%Pred-Pre: 102 %
FVC-%Pred-Pre: 50 %
FVC-Pre: 1.92 L
Pre FEV1/FVC ratio: 55 %
Pre FEV6/FVC Ratio: 96 %
RV % pred: 109 %
RV: 2.8 L
TLC % pred: 70 %
TLC: 4.79 L

## 2024-11-11 NOTE — Assessment & Plan Note (Addendum)
 SABRA

## 2024-11-11 NOTE — Progress Notes (Signed)
 Full pft w/o post performed today. Post not performed due to patient using his breathing treatment about an hour prior to test- okayed with JD.

## 2024-11-11 NOTE — Assessment & Plan Note (Addendum)
  Orders:   Ambulatory referral to Cardiothoracic Surgery

## 2024-11-11 NOTE — Patient Instructions (Signed)
 Full pft w/o post performed today. Post not performed due to patient using his breathing treatment about an hour prior to test- okayed with JD.

## 2024-11-11 NOTE — Progress Notes (Signed)
 Established Patient Pulmonology Office Visit   Subjective:  Patient ID: Mario [name], male    DOB: 02-07-1946  MRN: [id number]  CC:  Chief Complaint  Patient presents with   Medical Management of Chronic Issues    Pt states still SOB , wheezing , coughing bitter taste     Discussed the use of AI scribe software for clinical note transcription with the patient, who gave verbal consent to proceed.  History of Present Illness Mario [name] is a 79 year old male with asthma, COPD overlap syndrome, and bronchiectasis who presents for follow-up of his respiratory conditions.  He experiences ongoing dyspnea, which has worsened over the past few months. A suspected pneumonia episode two to three weeks ago was initially treated with an ineffective antibiotic, followed by a second antibiotic that temporarily alleviated symptoms. Symptoms have recurred in the last three to four days, with increased coughing, wheezing, and production of bitter-tasting sputum.  Pulmonary function tests show an FEV1/FVC ratio of 55, FEV1 at 38%, and FVC at 50%. Total lung capacity is 70%, and DLCO is 49%. A CT chest scan from September 2025 shows volume loss with mild traction bronchiectasis in the central upper lobes bilaterally and the right middle lobe, with more pronounced volume loss and bronchiectasis in the lower lobes. A stable three-millimeter right upper lobe nodule has been present since 2019.  He has been using Dupixent  injections for four months without noticeable improvement. He uses a flutter valve at home to help clear mucus, though it is not always effective. He experiences significant dyspnea with minimal exertion, such as walking short distances. He has been on steroids recently, which have not provided significant relief. No peripheral edema is noted, but he takes a daily diuretic. He coughs up mucus but does not observe its color.        ROS    Current Outpatient Medications:     acetaminophen  (TYLENOL ) 500 MG tablet, Take 1,000 mg by mouth every 6 (six) hours as needed for mild pain (pain score 1-3)., Disp: , Rfl:    alendronate  (FOSAMAX ) 70 MG tablet, Take 1 tablet (70 mg total) by mouth every 7 (seven) days. Take with a full glass of water  on an empty stomach., Disp: 4 tablet, Rfl: 11   Ascorbic Acid (VITAMIN C) 1000 MG tablet, Take 1,000 mg by mouth 2 (two) times daily., Disp: , Rfl:    budesonide -glycopyrrolate-formoterol  (BREZTRI  AEROSPHERE) 160-9-4.8 MCG/ACT AERO inhaler, Inhale 2 puffs into the lungs 2 (two) times daily., Disp: 10.7 g, Rfl: 11   Cholecalciferol (VITAMIN D3) 125 MCG (5000 UT) TABS, Take 2 tablets by mouth daily., Disp: , Rfl:    Coenzyme Q10 (CO Q-10) 100 MG CAPS, Take 1 capsule by mouth daily., Disp: , Rfl:    diltiazem  (DILACOR XR ) 240 MG 24 hr capsule, Take 1 capsule (240 mg total) by mouth daily., Disp: 90 capsule, Rfl: 1   dupilumab  (DUPIXENT ) 300 MG/2ML prefilled syringe, Inject 300 mg into the skin every 14 (fourteen) days., Disp: 4 mL, Rfl: 11   ELIQUIS  5 MG TABS tablet, TAKE 1 TABLET BY MOUTH TWICE A DAY, Disp: 60 tablet, Rfl: 5   FIBER COMPLETE PO, Take 1 tablet by mouth daily., Disp: , Rfl:    furosemide  (LASIX ) 40 MG tablet, Take 1 tablet (40 mg total) by mouth daily. Take additional 20 mg if your weight is 3 lbs in 1 day of 5 lbs in 1 week, Disp: 30 tablet, Rfl: 3  guaiFENesin  (MUCINEX ) 600 MG 12 hr tablet, Take 1 tablet (600 mg total) by mouth 2 (two) times daily., Disp: 60 tablet, Rfl: 0   Iron , Ferrous Sulfate , 325 (65 Fe) MG TABS, Take 325 mg by mouth daily., Disp: 30 tablet, Rfl: 11   levalbuterol  (XOPENEX  HFA) 45 MCG/ACT inhaler, Inhale 2 puffs into the lungs every 6 (six) hours as needed for wheezing., Disp: 1 each, Rfl: 12   levalbuterol  (XOPENEX ) 0.63 MG/3ML nebulizer solution, Take 3 mLs (0.63 mg total) by nebulization 4 (four) times daily for 7 days, THEN 3 mLs (0.63 mg total) every 6 (six) hours as needed for wheezing or  shortness of breath., Disp: 3 mL, Rfl: 3   metoprolol  succinate (TOPROL -XL) 100 MG 24 hr tablet, Take 1 tablet (100 mg total) by mouth daily. Take with or immediately following a meal., Disp: 90 tablet, Rfl: 3   montelukast  (SINGULAIR ) 10 MG tablet, TAKE 1 TABLET BY MOUTH EVERY DAY, Disp: 90 tablet, Rfl: 2   pantoprazole  (PROTONIX ) 40 MG tablet, TAKE 1 TABLET BY MOUTH TWICE A DAY, Disp: 180 tablet, Rfl: 2   Respiratory Therapy Supplies (FLUTTER) DEVI, Use as directed, Disp: 1 each, Rfl: 0   rosuvastatin  (CRESTOR ) 20 MG tablet, TAKE 1 TABLET BY MOUTH EVERY DAY **D/C LIPITOR**, Disp: 90 tablet, Rfl: 2   predniSONE  (DELTASONE ) 20 MG tablet, 40 mg a day for 1 week and then 20 mg a day for 1 week and then discontinue, Disp: 21 tablet, Rfl: 0      Objective:  BP 118/89   Pulse 82   Ht 5' 8.5 (1.74 m) Comment: per RT  Wt 201 lb (91.2 kg)   SpO2 93%   BMI 30.12 kg/m     Physical Exam Constitutional:      General: He is not in acute distress.    Appearance: Normal appearance. He is obese.  Eyes:     General: No scleral icterus.    Conjunctiva/sclera: Conjunctivae normal.  Cardiovascular:     Rate and Rhythm: Normal rate and regular rhythm.  Pulmonary:     Breath sounds: No wheezing, rhonchi or rales.  Musculoskeletal:     Right lower leg: No edema.     Left lower leg: No edema.  Skin:    General: Skin is warm and dry.  Neurological:     General: No focal deficit present.      Diagnostic Review:  Last CBC Lab Results  Component Value Date   WBC 9.5 10/21/2024   HGB 14.1 10/21/2024   HCT 44.9 10/21/2024   MCV 89.8 10/21/2024   MCH 28.2 10/21/2024   RDW 19.2 (H) 10/21/2024   PLT 392 10/21/2024   Last metabolic panel Lab Results  Component Value Date   GLUCOSE 105 (H) 10/17/2024   NA 140 10/17/2024   K 4.4 10/17/2024   CL 103 10/17/2024   CO2 22 10/17/2024   BUN 20 10/17/2024   CREATININE 0.80 10/17/2024   EGFR 90 10/17/2024   CALCIUM  9.1 10/17/2024   PHOS 3.3  04/17/2024   PROT 6.1 05/07/2024   ALBUMIN 2.6 (L) 04/17/2024   BILITOT 0.6 05/07/2024   ALKPHOS 41 04/17/2024   AST 37 (H) 05/07/2024   ALT 57 (H) 05/07/2024   ANIONGAP 13 04/18/2024       Assessment & Plan:   Assessment & Plan Asthma-COPD overlap syndrome (HCC)  Orders:   Pulse oximetry, overnight; Future  Bronchiectasis without complication (HCC)     Hiatal hernia  Orders:  Ambulatory referral to Cardiothoracic Surgery   Assessment and Plan Assessment & Plan Asthma-COPD overlap syndrome with bronchiectasis Mixed obstructive and restrictive pattern with moderately reduced diffusion capacity. Recent exacerbation despite Dupixent . Possible silent reflux contribution due to hiatal hernia. Steroids inconsistently effective. - Simple walk test did not indicate oxygen desaturations, patient could only complete 1 lap (269ft) - Ordered overnight oxygen test for nocturnal needs. - Consider antibiotics/steroids if symptoms worsen. - continue dupixent  therapy, will discuss with allergy  team - continue breztri  inhaler 2 puffs twice daily  Hiatal hernia Hiatal hernia possibly contributing to silent reflux and potential bronchiectasis. Will consider surgical intervention to improve respiratory function. - Referred to Dr. Shyrl for surgical evaluation      Return in about 3 months (around 02/11/2025) for f/u visit Dr. Kara.   Dorn KATHEE Kara, MD

## 2024-11-11 NOTE — Patient Instructions (Addendum)
 Continue Dupixent  injections every 2 weeks  Continue breztri  2 puffs twice daily - rinse mouth out after each use  Continue levalbuterol  inhaler or nebulizer treatment as needed  We will walk you today to determine if you qualify for oxygen  Referral to Cardiothoracic surgery to evaluate your hiatal hernia and if you qualify for surgical repair  Follow up in 3 months, call sooner if needed

## 2024-11-12 ENCOUNTER — Telehealth (HOSPITAL_COMMUNITY): Payer: Self-pay

## 2024-11-12 ENCOUNTER — Encounter (HOSPITAL_COMMUNITY): Payer: Self-pay

## 2024-11-12 NOTE — Telephone Encounter (Signed)
 Called patient to see if he was interested in participating in the Pulmonary Rehab Program. Patient will come in for orientation on 11/25/24@ 9:00 and will attend the 10:15 exercise class.  Pensions consultant.

## 2024-11-12 NOTE — Telephone Encounter (Signed)
Called patient to see if he is interested in the Pulmonary Rehab Program. Patient expressed interest. Explained scheduling process and went over insurance process, patient verbalized understanding. Someone from our pulmonary rehab staff will contact pt at a later time. °

## 2024-11-12 NOTE — Telephone Encounter (Signed)
 Pt insurance is active and benefits verified through [facility] Co-pay 15.00, No Deductible. out of pocket 3600.00/3600.00 met, co-insurance  .pre-authorization notrequired. Spoke with Selinda NOVAK, REF# [id number].  NO visit Limit

## 2024-11-18 ENCOUNTER — Encounter: Payer: Self-pay | Admitting: Family Medicine

## 2024-11-18 ENCOUNTER — Telehealth: Payer: Self-pay | Admitting: *Deleted

## 2024-11-18 ENCOUNTER — Ambulatory Visit (INDEPENDENT_AMBULATORY_CARE_PROVIDER_SITE_OTHER): Admitting: Family Medicine

## 2024-11-18 VITALS — BP 104/92 | HR 92 | Temp 97.5°F | Ht 68.5 in | Wt 196.4 lb

## 2024-11-18 DIAGNOSIS — J471 Bronchiectasis with (acute) exacerbation: Secondary | ICD-10-CM

## 2024-11-18 MED ORDER — LEVOFLOXACIN 500 MG PO TABS: 500.0000 mg | ORAL_TABLET | Freq: Every day | ORAL | 0 refills | Status: AC

## 2024-11-18 NOTE — Telephone Encounter (Signed)
-----   Message from [name] sent at 11/12/2024  3:12 PM EST ----- Regarding: RE: Asthma Symptoms Ok, let him know. ----- Message ----- From: [name], CMA Sent: 11/12/2024   2:13 PM EST To: [name], MD Subject: RE: Asthma Symptoms                            Dr Kozlow, We would need appt in clinic for documentation from you about failure/change in therapy to get approval with Ins. He has not been seen in clinic in 6 mos. [name] ----- Message ----- From: Denis Camellia JINNY, MD Sent: 11/12/2024   9:43 AM EST To: Madelin HERO Otha, CMA; Dorn KATHEE Chill, MD Subject: RE: Asthma Symptoms                            Will do.  [name], can you arrange for Mario [name] to receive teze for severe asthma to replace his dupi? ----- Message ----- From: [name], MD Sent: 11/12/2024   8:25 AM EST To: [name], MD Subject: RE: Asthma Symptoms                            If your team can manage him switching from Dupixent  to tezepelumab.   Thanks, JD ----- Message ----- From: Denis Camellia JINNY, MD Sent: 11/12/2024   7:55 AM EST To: Dorn KATHEE Chill, MD Subject: RE: Asthma Symptoms                            It may be better for him to use tezepelumab instead of dupilumab .  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  Would you like us  to start his tezepelumab or is that an order you can send to [employer] [employer] and infusion services? ----- Message ----- From: [name], MD Sent: 11/11/2024   9:02 PM EST To: [name], MD Subject: Asthma Symptoms                                Hi Dr. Denis,  I saw patient in clinic today and he reports no major improvement in his symptoms since starting dupixent  about 4 months ago.    He has moderate hiatal hernia noted on prior CT Chest scans. I am referring him to CT Surgery for evaluation of surgical repair incase his on going asthma symptoms are from silent reflux.   Given his allergy  profile and elevated IgE, I am surprised he has not received benefit from dupixent .   Let me know if you have other thoughts.  Thanks, [name]

## 2024-11-18 NOTE — Progress Notes (Signed)
 Subjective:    Patient ID: Mario [name], male    DOB: 1946-05-15, 78 y.o.   MRN: [id number] Patient states he was doing well after his last round of antibiotics however over the last 2 days he has developed a cough productive of purulent green sputum and increasing shortness of breath and increasing chest congestion.  He denies any fever or chills.  He denies any chest pain but he does report worsening fatigue and dyspnea on exertion Past Medical History:  Diagnosis Date   Arrhythmia    Arthritis    Asthma    Atrial fibrillation (HCC)    Biliary colic 2014   Bronchiectasis (HCC)    2021   Cataract    COPD (chronic obstructive pulmonary disease) with chronic bronchitis (HCC)    Coronary artery calcification seen on CAT scan    Cyst of pancreas    1 cm (7/18), repeat ct in 12 months.     Hyperlipidemia    Hypertension    Obstructive sleep apnea    Renal disorder    Rhinitis    Sleep apnea    pt denies sleep apnea-lost weight   Past Surgical History:  Procedure Laterality Date   ATRIAL FIBRILLATION ABLATION N/A 09/19/2024   Procedure: ATRIAL FIBRILLATION ABLATION;  Surgeon: Nancey Eulas BRAVO, MD;  Location: [facility];  Service: Cardiovascular;  Laterality: N/A;   CARDIOVERSION N/A 05/14/2024   Procedure: CARDIOVERSION;  Surgeon: Michele Richardson, DO;  Location: [facility];  Service: Cardiovascular;  Laterality: N/A;   CATARACT EXTRACTION Right    CHOLECYSTECTOMY     COLONOSCOPY  09/29/2014   INCISION AND DRAINAGE OF WOUND Left 03/13/2024   Procedure: IRRIGATION AND DEBRIDEMENT WOUND;  Surgeon: Shari Easter, MD;  Location: [facility];  Service: Orthopedics;  Laterality: Left;  IRRIGATION AND DEBRIDEMENT FIFTH FINGER LEFT   IR RADIOLOGIST EVAL & MGMT  09/12/2024   LAPAROSCOPIC CHOLECYSTECTOMY SINGLE SITE WITH INTRAOPERATIVE CHOLANGIOGRAM N/A 09/15/2019   Procedure: LAPAROSCOPIC CHOLECYSTECTOMY SINGLE SITE;  Surgeon: Sheldon Standing, MD;  Location: [facility];  Service:  General;  Laterality: N/A;   LITHOTRIPSY  2000   for kidney stone   Current Outpatient Medications on File Prior to Visit  Medication Sig Dispense Refill   acetaminophen  (TYLENOL ) 500 MG tablet Take 1,000 mg by mouth every 6 (six) hours as needed for mild pain (pain score 1-3).     alendronate  (FOSAMAX ) 70 MG tablet Take 1 tablet (70 mg total) by mouth every 7 (seven) days. Take with a full glass of water  on an empty stomach. 4 tablet 11   Ascorbic Acid (VITAMIN C) 1000 MG tablet Take 1,000 mg by mouth 2 (two) times daily.     budesonide -glycopyrrolate-formoterol  (BREZTRI  AEROSPHERE) 160-9-4.8 MCG/ACT AERO inhaler Inhale 2 puffs into the lungs 2 (two) times daily. 10.7 g 11   Cholecalciferol (VITAMIN D3) 125 MCG (5000 UT) TABS Take 2 tablets by mouth daily.     Coenzyme Q10 (CO Q-10) 100 MG CAPS Take 1 capsule by mouth daily.     diltiazem  (DILACOR XR ) 240 MG 24 hr capsule Take 1 capsule (240 mg total) by mouth daily. 90 capsule 1   dupilumab  (DUPIXENT ) 300 MG/2ML prefilled syringe Inject 300 mg into the skin every 14 (fourteen) days. 4 mL 11   ELIQUIS  5 MG TABS tablet TAKE 1 TABLET BY MOUTH TWICE A DAY 60 tablet 5   FIBER COMPLETE PO Take 1 tablet by mouth daily.     furosemide  (LASIX )  40 MG tablet Take 1 tablet (40 mg total) by mouth daily. Take additional 20 mg if your weight is 3 lbs in 1 day of 5 lbs in 1 week 30 tablet 3   guaiFENesin  (MUCINEX ) 600 MG 12 hr tablet Take 1 tablet (600 mg total) by mouth 2 (two) times daily. 60 tablet 0   Iron , Ferrous Sulfate , 325 (65 Fe) MG TABS Take 325 mg by mouth daily. 30 tablet 11   levalbuterol  (XOPENEX  HFA) 45 MCG/ACT inhaler Inhale 2 puffs into the lungs every 6 (six) hours as needed for wheezing. 1 each 12   levalbuterol  (XOPENEX ) 0.63 MG/3ML nebulizer solution Take 3 mLs (0.63 mg total) by nebulization 4 (four) times daily for 7 days, THEN 3 mLs (0.63 mg total) every 6 (six) hours as needed for wheezing or shortness of breath. 3 mL 3   metoprolol   succinate (TOPROL -XL) 100 MG 24 hr tablet Take 1 tablet (100 mg total) by mouth daily. Take with or immediately following a meal. 90 tablet 3   montelukast  (SINGULAIR ) 10 MG tablet TAKE 1 TABLET BY MOUTH EVERY DAY 90 tablet 2   pantoprazole  (PROTONIX ) 40 MG tablet TAKE 1 TABLET BY MOUTH TWICE A DAY 180 tablet 2   Respiratory Therapy Supplies (FLUTTER) DEVI Use as directed 1 each 0   rosuvastatin  (CRESTOR ) 20 MG tablet TAKE 1 TABLET BY MOUTH EVERY DAY **D/C LIPITOR** 90 tablet 2   No current facility-administered medications on file prior to visit.   Allergies  Allergen Reactions   Dust Mite Extract Other (See Comments)    Asthma related   Social History   Socioeconomic History   Marital status: Married    Spouse name: Not on file   Number of children: Not on file   Years of education: Not on file   Highest education level: Bachelor's degree (e.g., BA, AB, BS)  Occupational History   Not on file  Tobacco Use   Smoking status: Former    Current packs/day: 0.00    Average packs/day: 2.0 packs/day for 11.0 years (22.0 ttl pk-yrs)    Types: Cigarettes    Start date: 12/20/1963    Quit date: 12/19/1974    Years since quitting: 49.9   Smokeless tobacco: Never  Vaping Use   Vaping status: Never Used  Substance and Sexual Activity   Alcohol use: Not Currently    Alcohol/week: 1.0 standard drink of alcohol   Drug use: No   Sexual activity: Not on file  Other Topics Concern   Not on file  Social History Narrative   Not on file   Social Drivers of Health   Financial Resource Strain: Medium Risk (10/19/2024)   Overall Financial Resource Strain (CARDIA)    Difficulty of Paying Living Expenses: Somewhat hard  Food Insecurity: No Food Insecurity (10/19/2024)   Hunger Vital Sign    Worried About Running Out of Food in the Last Year: Never true    Ran Out of Food in the Last Year: Never true  Transportation Needs: No Transportation Needs (10/19/2024)   PRAPARE - Doctor, General Practice (Medical): No    Lack of Transportation (Non-Medical): No  Physical Activity: Inactive (10/19/2024)   Exercise Vital Sign    Days of Exercise per Week: 0 days    Minutes of Exercise per Session: Not on file  Stress: No Stress Concern Present (10/19/2024)   [employer] of Occupational Health - Occupational Stress Questionnaire    Feeling of Stress: Only  a little  Social Connections: Socially Integrated (10/19/2024)   Social Connection and Isolation Panel    Frequency of Communication with Friends and Family: More than three times a week    Frequency of Social Gatherings with Friends and Family: More than three times a week    Attends Religious Services: More than 4 times per year    Active Member of [employer] or Organizations: Yes    Attends Banker Meetings: 1 to 4 times per year    Marital Status: Married  Catering Manager Violence: Not At Risk (05/08/2024)   Humiliation, Afraid, Rape, and Kick questionnaire    Fear of Current or Ex-Partner: No    Emotionally Abused: No    Physically Abused: No    Sexually Abused: No      Review of Systems  Respiratory:  Positive for cough.   All other systems reviewed and are negative.      Objective:   Physical Exam Vitals reviewed.  Constitutional:      Appearance: He is well-developed.  HENT:     Head: Normocephalic and atraumatic.     Nose: Nose normal.     Mouth/Throat:     Pharynx: No oropharyngeal exudate.  Neck:     Vascular: No carotid bruit.  Cardiovascular:     Rate and Rhythm: Normal rate. Rhythm irregular.     Heart sounds: Normal heart sounds.  Pulmonary:     Effort: Pulmonary effort is normal. No tachypnea or respiratory distress.     Breath sounds: Decreased air movement present. No stridor. Examination of the right-middle field reveals rhonchi and rales. Examination of the right-lower field reveals rhonchi and rales. Wheezing, rhonchi and rales present.           Assessment &  Plan:  Bronchiectasis with (acute) exacerbation (HCC) Patient appears to be having another exacerbation of bronchiectasis.  He has not been doing his flutter valve or his incentive spirometry.  I encouraged the patient to be compliant with these exercises at home to help improve mucociliary clearance and prevent future exacerbations.  I will give the patient 14 days of Levaquin  500 mg daily to see if an extended course will help clear the infection completely.  However I explained to the patient the principle of antibiotic resistance.  We need to improve compliance with mucociliary clearance exercises to help prevent future attacks

## 2024-11-18 NOTE — Telephone Encounter (Signed)
 L/m for patient to reach out to [place] clinic to make appt for followup to get change from Dupixent  to Tezspire

## 2024-11-19 ENCOUNTER — Ambulatory Visit (HOSPITAL_COMMUNITY)
Admission: RE | Admit: 2024-11-19 | Discharge: 2024-11-19 | Disposition: A | Discharge status: Home / Self Care | Source: Ambulatory Visit | Attending: Physician Assistant | Admitting: Physician Assistant

## 2024-11-19 VITALS — BP 74/62 | HR 90 | Ht 68.5 in | Wt 194.8 lb

## 2024-11-19 DIAGNOSIS — I4819 Other persistent atrial fibrillation: Secondary | ICD-10-CM | POA: Diagnosis not present

## 2024-11-19 DIAGNOSIS — I4891 Unspecified atrial fibrillation: Secondary | ICD-10-CM

## 2024-11-19 DIAGNOSIS — D6869 Other thrombophilia: Secondary | ICD-10-CM

## 2024-11-19 NOTE — Patient Instructions (Signed)
 [facility] Admission is scheduled for: January 5th   Come to the Afib Clinic for pre-admission work up at: 8:30 AM   You may eat/drink prior to the appointment. Please take all your normal morning medications prior to arrival.   If you should miss any doses of your blood thinner going forward please notify our office immediately.   If you start any new medications after your admission is scheduled please notify our office immediately.  No Benadryl  use at least 3 days prior to admission.   We will start the authorization process with your insurance for the hospital stay.   You may bring a suitcase when you check in to admissions with belongings for your stay - clothes to keep you comfortable and things to keep you busy.   All medications will be provided for you while in the hospital.  If you are on a CPAP machine - please bring with you to the hospital.    Let me know if you have any questions regarding the above instructions or about the admission in general.   [facility] RN Afib Clinic [phone]

## 2024-11-19 NOTE — Progress Notes (Signed)
 Primary Care Physician: [name] DASEN, MD Primary Cardiologist: Dorn Lesches, MD Electrophysiologist: Eulas FORBES Furbish, MD  Referring Physician: Dr Furbish Ozell LITTIE Mario [name] is a 78 y.o. male with a history of CAD, OSA, HTN, HLD, COPD, asthma, atrial fibrillation who presents for follow up in the [facility]. He was diagnosed with atrial fibrillation in 2019.  Prior to this he experience episodes of palpitations on and off for 2 or so years.  A monitor was placed that did document atrial fibrillation. He was admitted with atrial fibrillation and RVR in February 2024.  He was discharged with metoprolol  and taken off albuterol .  He was again admitted in April and May 2025 with atrial fibrillation RVR in the setting of COPD exacerbation.   He underwent DC cardioversion on May 14, 2024.  The patient believes that he converted back to atrial fibrillation the day following his cardioversion. Patient is on Eliquis  for stroke prevention. He was seen by Dr Furbish and underwent afib ablation on 09/19/24. He believes he went back into afib about two days after his ablation. He was set up for DCCV but this was postponed as he was being treated for pneumonia and bronchiectasis.   Patient returns for follow up for atrial fibrillation. He remains in rate controlled afib. He is on another round of antibiotics for bronchiectasis. No bleeding issues on anticoagulation.   Today, he  denies symptoms of palpitations, chest pain, shortness of breath, orthopnea, PND, lower extremity edema, dizziness, presyncope, syncope, bleeding, or neurologic sequela. The patient is tolerating medications without difficulties and is otherwise without complaint today.    Atrial Fibrillation Risk Factors:  he does have symptoms or diagnosis of sleep apnea. he does not have a history of rheumatic fever.   Atrial Fibrillation Management history:  Previous antiarrhythmic drugs: none Previous  cardioversions: 05/14/24 Previous ablations: 09/19/24 Anticoagulation history: Eliquis   ROS- All systems are reviewed and negative except as per the HPI above.  Past Medical History:  Diagnosis Date   Arrhythmia    Arthritis    Asthma    Atrial fibrillation (HCC)    Biliary colic 2014   Bronchiectasis (HCC)    2021   Cataract    COPD (chronic obstructive pulmonary disease) with chronic bronchitis (HCC)    Coronary artery calcification seen on CAT scan    Cyst of pancreas    1 cm (7/18), repeat ct in 12 months.     Hyperlipidemia    Hypertension    Obstructive sleep apnea    Renal disorder    Rhinitis    Sleep apnea    pt denies sleep apnea-lost weight    Current Outpatient Medications  Medication Sig Dispense Refill   acetaminophen  (TYLENOL ) 500 MG tablet Take 1,000 mg by mouth every 6 (six) hours as needed for mild pain (pain score 1-3). (Patient taking differently: Take 1,000 mg by mouth as needed for mild pain (pain score 1-3).)     alendronate  (FOSAMAX ) 70 MG tablet Take 1 tablet (70 mg total) by mouth every 7 (seven) days. Take with a full glass of water  on an empty stomach. 4 tablet 11   Ascorbic Acid (VITAMIN C) 1000 MG tablet Take 1,000 mg by mouth 2 (two) times daily.     budesonide -glycopyrrolate-formoterol  (BREZTRI  AEROSPHERE) 160-9-4.8 MCG/ACT AERO inhaler Inhale 2 puffs into the lungs 2 (two) times daily. 10.7 g 11   Cholecalciferol (VITAMIN D3) 125 MCG (5000 UT) TABS Take 2 tablets by  mouth daily.     Coenzyme Q10 (CO Q-10) 100 MG CAPS Take 1 capsule by mouth daily.     diltiazem  (DILACOR XR ) 240 MG 24 hr capsule Take 1 capsule (240 mg total) by mouth daily. 90 capsule 1   dorzolamide -timolol  (COSOPT ) 2-0.5 % ophthalmic solution Place 1 drop into the right eye 2 (two) times daily.     dupilumab  (DUPIXENT ) 300 MG/2ML prefilled syringe Inject 300 mg into the skin every 14 (fourteen) days. 4 mL 11   ELIQUIS  5 MG TABS tablet TAKE 1 TABLET BY MOUTH TWICE A DAY 60 tablet  5   FIBER COMPLETE PO Take 1 tablet by mouth daily.     furosemide  (LASIX ) 40 MG tablet Take 1 tablet (40 mg total) by mouth daily. Take additional 20 mg if your weight is 3 lbs in 1 day of 5 lbs in 1 week 30 tablet 3   guaiFENesin  (MUCINEX ) 600 MG 12 hr tablet Take 1 tablet (600 mg total) by mouth 2 (two) times daily. 60 tablet 0   Iron , Ferrous Sulfate , 325 (65 Fe) MG TABS Take 325 mg by mouth daily. 30 tablet 11   levalbuterol  (XOPENEX  HFA) 45 MCG/ACT inhaler Inhale 2 puffs into the lungs every 6 (six) hours as needed for wheezing. 1 each 12   levalbuterol  (XOPENEX ) 0.63 MG/3ML nebulizer solution Take 3 mLs (0.63 mg total) by nebulization 4 (four) times daily for 7 days, THEN 3 mLs (0.63 mg total) every 6 (six) hours as needed for wheezing or shortness of breath. 3 mL 3   levofloxacin  (LEVAQUIN ) 500 MG tablet Take 1 tablet (500 mg total) by mouth daily for 14 days. 14 tablet 0   metoprolol  succinate (TOPROL -XL) 100 MG 24 hr tablet Take 1 tablet (100 mg total) by mouth daily. Take with or immediately following a meal. 90 tablet 3   montelukast  (SINGULAIR ) 10 MG tablet TAKE 1 TABLET BY MOUTH EVERY DAY 90 tablet 2   pantoprazole  (PROTONIX ) 40 MG tablet TAKE 1 TABLET BY MOUTH TWICE A DAY 180 tablet 2   Respiratory Therapy Supplies (FLUTTER) DEVI Use as directed 1 each 0   rosuvastatin  (CRESTOR ) 20 MG tablet TAKE 1 TABLET BY MOUTH EVERY DAY **D/C LIPITOR** 90 tablet 2   No current facility-administered medications for this encounter.    Physical Exam: BP (!) 74/62   Pulse 90   Ht 5' 8.5 (1.74 m)   Wt 88.4 kg   BMI 29.19 kg/m   GEN: Well nourished, well developed in no acute distress CARDIAC: Irregularly irregular rate and rhythm, no murmurs, rubs, gallops RESPIRATORY:  Clear to auscultation without rales, wheezing or rhonchi  ABDOMEN: Soft, non-tender, non-distended EXTREMITIES:  No edema; No deformity    Wt Readings from Last 3 Encounters:  11/19/24 88.4 kg  11/18/24 89.1 kg   11/11/24 91.2 kg     EKG Interpretation Date/Time:  Tuesday November 19 2024 14:08:18 EST Ventricular Rate:  90 PR Interval:    QRS Duration:  70 QT Interval:  358 QTC Calculation: 437 R Axis:   -47  Text Interpretation: Atrial fibrillation Left anterior fasicular block Septal infarct (cited on or before 17-Oct-2024) Abnormal ECG When compared with ECG of 17-Oct-2024 13:55, No significant change was found Confirmed by Nishita Isaacks ([id number]) on 11/19/2024 2:10:43 PM    Echo 04/16/24 demonstrated   1. Left ventricular ejection fraction, by estimation, is 50 to 55%. The  left ventricle has low normal function. Left ventricular endocardial  border not optimally defined  to evaluate regional wall motion. There is  mild concentric left ventricular hypertrophy. Left ventricular diastolic function could not be evaluated.   2. Right ventricular systolic function is low normal. The right  ventricular size is normal.   3. Left atrial size was moderately dilated.   4. The mitral valve is normal in structure. Trivial mitral valve  regurgitation. No evidence of mitral stenosis.   5. The aortic valve is grossly normal. There is mild calcification of the  aortic valve. There is mild thickening of the aortic valve. Aortic valve  regurgitation is not visualized. Aortic valve sclerosis/calcification is  present, without any evidence of  aortic stenosis.    CHA2DS2-VASc Score = 5  The patient's score is based upon: CHF History: 1 HTN History: 1 Diabetes History: 0 Stroke History: 0 Vascular Disease History: 1 Age Score: 2 Gender Score: 0       ASSESSMENT AND PLAN: Persistent Atrial Fibrillation (ICD10:  I48.19) The patient's CHA2DS2-VASc score is 5, indicating a 7.2% annual risk of stroke.   S/p afib ablation 09/19/24 with quick return of afib. We discussed rhythm control options today. Would avoid class IC with h/o CAD. Amiodarone is not ideal with his baseline lung issues.  Patient would  like to pursue dofetilide admission Continue Eliquis  5 mg BID, states no missed doses in the last 3 weeks. PharmD to screen medications for QT prolonging agents. He will be on levofloxacin  for 14 days, will plan for admission after he completes antibiotics.  QTc in SR 442 ms Continue diltiazem  240 mg daily Continue Eliquis  5 mg BID Continue Toprol  100 mg daily  Secondary Hypercoagulable State (ICD10:  D68.69) The patient is at significant risk for stroke/thromboembolism based upon his CHA2DS2-VASc Score of 5.  Continue Apixaban  (Eliquis ). No bleeding issues.   CAD CAC score 952 No anginal symptoms Followed by Dr Court  [facility]  Not currently on CPAP  HTN Low today, patient denies presyncope or lightheadedness.    Follow up in the AF clinic for dofetilide admission.    [facility] [address] [place], [place] [address] [phone]

## 2024-11-20 ENCOUNTER — Telehealth (HOSPITAL_COMMUNITY): Payer: Self-pay

## 2024-11-20 ENCOUNTER — Other Ambulatory Visit (HOSPITAL_COMMUNITY): Payer: Self-pay

## 2024-11-20 NOTE — Telephone Encounter (Signed)
 Pharmacy Patient Advocate Encounter  Insurance verification completed.    The patient is insured through [employer]. Patient has Medicare and is not eligible for a copay card, but may be able to apply for patient assistance or Medicare RX Payment Plan (Patient Must reach out to their plan, if eligible for payment plan), if available.    Ran test claim for Dofetilide 500mcg capsule and the current 30 day co-pay is $0.   This test claim was processed through [facility]- copay amounts may vary at other pharmacies due to [employer], or as the patient moves through the different stages of their insurance plan.

## 2024-11-20 NOTE — Telephone Encounter (Signed)
 Patient called and stated that he wanted to cancel all Pulmonary rehab sessions right now.  He stated that he had a lot going on right now..He would like to start back up in Feb, advised that he can call us  when ready to start back up.  Closed Referral

## 2024-11-21 ENCOUNTER — Telehealth: Payer: Self-pay | Admitting: Pharmacist

## 2024-11-21 NOTE — Telephone Encounter (Signed)
 Medication list reviewed in anticipation of upcoming Tikosyn initiation. Patient is taking a few interacting medications.  Furosemide : may increase QTc-prolonging effects of dofetilide due to its effects of K and Mag. Please monitor K and Mag closely while on dofetilide.  Breztri :  may increase QTc-prolonging effects of dofetilide. Monitor QTc closely.   Dilitazem:  may increase serum concentrations of Dofetilide. Monitor QTc closely   Xopenex :  may increase QTc-prolonging effects of dofetelide. Monitor QTc closely. Written for as needed.  He is on a few medications that could prolong the QTc, however he has very limited antiarrythmic options (all would have some risk of QTc) and due to his COPD, edema and afib, he requires all of the medications above. Will need to be monitored closely.  Patient is anticoagulated on Eliquis  on the appropriate dose. Please ensure that patient has not missed any anticoagulation doses in the 3 weeks prior to Tikosyn initiation.   Patient will need to be counseled to avoid use of Benadryl  while on Tikosyn and in the 2-3 days prior to Tikosyn initiation.  Of note, patient is on Levaquin . He should not start dofetilide for 2 days after last dose.

## 2024-11-25 ENCOUNTER — Telehealth (HOSPITAL_COMMUNITY): Payer: Self-pay | Admitting: *Deleted

## 2024-11-25 ENCOUNTER — Encounter (HOSPITAL_COMMUNITY)

## 2024-11-25 ENCOUNTER — Encounter: Payer: Self-pay | Admitting: Pulmonary Disease

## 2024-11-25 NOTE — Telephone Encounter (Signed)
 Patient unable to afford hospitalization copay costs. Tikosyn admission canceled. Pt requested appt to regroup

## 2024-11-28 ENCOUNTER — Ambulatory Visit: Admitting: Family Medicine

## 2024-11-29 ENCOUNTER — Ambulatory Visit (HOSPITAL_COMMUNITY): Admitting: Certified Registered Nurse Anesthetist

## 2024-11-29 ENCOUNTER — Other Ambulatory Visit: Payer: Self-pay

## 2024-11-29 ENCOUNTER — Ambulatory Visit (HOSPITAL_COMMUNITY)
Admission: AD | Admit: 2024-11-29 | Discharge: 2024-11-29 | Disposition: A | Discharge status: Home / Self Care | Source: Ambulatory Visit | Attending: Orthopedic Surgery | Admitting: Orthopedic Surgery

## 2024-11-29 ENCOUNTER — Encounter (HOSPITAL_COMMUNITY): Payer: Self-pay | Admitting: Orthopedic Surgery

## 2024-11-29 ENCOUNTER — Encounter: Admitting: Thoracic Surgery (Cardiothoracic Vascular Surgery)

## 2024-11-29 ENCOUNTER — Encounter (HOSPITAL_COMMUNITY)
Admission: AD | Disposition: A | Discharge status: Home / Self Care | Payer: Self-pay | Source: Ambulatory Visit | Attending: Orthopedic Surgery

## 2024-11-29 DIAGNOSIS — M1990 Unspecified osteoarthritis, unspecified site: Secondary | ICD-10-CM | POA: Diagnosis not present

## 2024-11-29 DIAGNOSIS — L02512 Cutaneous abscess of left hand: Secondary | ICD-10-CM | POA: Diagnosis not present

## 2024-11-29 DIAGNOSIS — Z87891 Personal history of nicotine dependence: Secondary | ICD-10-CM | POA: Diagnosis not present

## 2024-11-29 DIAGNOSIS — J4489 Other specified chronic obstructive pulmonary disease: Secondary | ICD-10-CM | POA: Diagnosis not present

## 2024-11-29 DIAGNOSIS — L0291 Cutaneous abscess, unspecified: Secondary | ICD-10-CM | POA: Diagnosis not present

## 2024-11-29 DIAGNOSIS — I509 Heart failure, unspecified: Secondary | ICD-10-CM | POA: Diagnosis not present

## 2024-11-29 DIAGNOSIS — I2510 Atherosclerotic heart disease of native coronary artery without angina pectoris: Secondary | ICD-10-CM | POA: Diagnosis not present

## 2024-11-29 DIAGNOSIS — M79645 Pain in left finger(s): Secondary | ICD-10-CM | POA: Diagnosis present

## 2024-11-29 DIAGNOSIS — Z7901 Long term (current) use of anticoagulants: Secondary | ICD-10-CM | POA: Diagnosis not present

## 2024-11-29 DIAGNOSIS — I10 Essential (primary) hypertension: Secondary | ICD-10-CM | POA: Diagnosis not present

## 2024-11-29 DIAGNOSIS — I110 Hypertensive heart disease with heart failure: Secondary | ICD-10-CM | POA: Diagnosis not present

## 2024-11-29 HISTORY — PX: INCISION AND DRAINAGE OF DEEP ABSCESS, HAND: SHX7323

## 2024-11-29 LAB — POCT I-STAT, CHEM 8
BUN: 35 mg/dL — ABNORMAL HIGH (ref 8–23)
Calcium, Ion: 1.1 mmol/L — ABNORMAL LOW (ref 1.15–1.40)
Chloride: 105 mmol/L (ref 98–111)
Creatinine, Ser: 0.9 mg/dL (ref 0.61–1.24)
Glucose, Bld: 85 mg/dL (ref 70–99)
HCT: 46 % (ref 39.0–52.0)
Hemoglobin: 15.6 g/dL (ref 13.0–17.0)
Potassium: 6.2 mmol/L — ABNORMAL HIGH (ref 3.5–5.1)
Sodium: 139 mmol/L (ref 135–145)
TCO2: 25 mmol/L (ref 22–32)

## 2024-11-29 LAB — CBC
HCT: 51.7 % (ref 39.0–52.0)
Hemoglobin: 16.2 g/dL (ref 13.0–17.0)
MCH: 30.3 pg (ref 26.0–34.0)
MCHC: 31.3 g/dL (ref 30.0–36.0)
MCV: 96.6 fL (ref 80.0–100.0)
Platelets: 292 K/uL (ref 150–400)
RBC: 5.35 MIL/uL (ref 4.22–5.81)
RDW: 20.2 % — ABNORMAL HIGH (ref 11.5–15.5)
WBC: 12.6 K/uL — ABNORMAL HIGH (ref 4.0–10.5)
nRBC: 0 % (ref 0.0–0.2)

## 2024-11-29 SURGERY — INCISION AND DRAINAGE OF DEEP ABSCESS, HAND
Anesthesia: General | Site: Hand | Laterality: Left

## 2024-11-29 MED ORDER — ALBUMIN HUMAN 5 % IV SOLN: INTRAVENOUS | Status: DC | PRN

## 2024-11-29 MED ORDER — SODIUM CHLORIDE 0.9 % IR SOLN
Status: DC | PRN
  Administered 2024-11-29: 3000 mL

## 2024-11-29 MED ORDER — PHENYLEPHRINE 80 MCG/ML (10ML) SYRINGE FOR IV PUSH (FOR BLOOD PRESSURE SUPPORT)
PREFILLED_SYRINGE | INTRAVENOUS | Status: DC | PRN
  Administered 2024-11-29: 160 ug via INTRAVENOUS
  Administered 2024-11-29: 320 ug via INTRAVENOUS

## 2024-11-29 MED ORDER — PROPOFOL 10 MG/ML IV BOLUS
INTRAVENOUS | Status: AC
  Filled 2024-11-29: qty 20

## 2024-11-29 MED ORDER — FENTANYL CITRATE (PF) 100 MCG/2ML IJ SOLN
25.0000 ug | INTRAMUSCULAR | Status: DC | PRN
  Administered 2024-11-29 (×2): 25 ug via INTRAVENOUS

## 2024-11-29 MED ORDER — PHENYLEPHRINE HCL-NACL 20-0.9 MG/250ML-% IV SOLN
INTRAVENOUS | Status: DC | PRN
  Administered 2024-11-29: 75 ug/min via INTRAVENOUS

## 2024-11-29 MED ORDER — FENTANYL CITRATE (PF) 250 MCG/5ML IJ SOLN
INTRAMUSCULAR | Status: DC | PRN
  Administered 2024-11-29: 50 ug via INTRAVENOUS

## 2024-11-29 MED ORDER — PROPOFOL 10 MG/ML IV BOLUS
INTRAVENOUS | Status: DC | PRN
  Administered 2024-11-29: 80 mg via INTRAVENOUS

## 2024-11-29 MED ORDER — ONDANSETRON HCL 4 MG/2ML IJ SOLN
INTRAMUSCULAR | Status: DC | PRN
  Administered 2024-11-29: 4 mg via INTRAVENOUS

## 2024-11-29 MED ORDER — ORAL CARE MOUTH RINSE: 15.0000 mL | Freq: Once | OROMUCOSAL | Status: AC

## 2024-11-29 MED ORDER — CHLORHEXIDINE GLUCONATE 0.12 % MT SOLN: 15.0000 mL | Freq: Once | OROMUCOSAL | Status: AC

## 2024-11-29 MED ORDER — LACTATED RINGERS IV SOLN: INTRAVENOUS | Status: DC

## 2024-11-29 MED ORDER — CHLORHEXIDINE GLUCONATE 0.12 % MT SOLN
OROMUCOSAL | Status: AC
  Administered 2024-11-29: 15 mL via OROMUCOSAL
  Filled 2024-11-29: qty 15

## 2024-11-29 MED ORDER — DROPERIDOL 2.5 MG/ML IJ SOLN: 0.6250 mg | Freq: Once | INTRAMUSCULAR | Status: DC | PRN

## 2024-11-29 MED ORDER — FENTANYL CITRATE (PF) 100 MCG/2ML IJ SOLN
INTRAMUSCULAR | Status: AC
  Filled 2024-11-29: qty 2

## 2024-11-29 MED ORDER — ACETAMINOPHEN 10 MG/ML IV SOLN: 1000.0000 mg | Freq: Once | INTRAVENOUS | Status: DC | PRN

## 2024-11-29 MED ORDER — LIDOCAINE 2% (20 MG/ML) 5 ML SYRINGE
INTRAMUSCULAR | Status: DC | PRN
  Administered 2024-11-29: 60 mg via INTRAVENOUS

## 2024-11-29 SURGICAL SUPPLY — 47 items
BAG COUNTER SPONGE SURGICOUNT (BAG) ×1 IMPLANT
BNDG COHESIVE 1X5 TAN STRL LF (GAUZE/BANDAGES/DRESSINGS) IMPLANT
BNDG COMPR ESMARK 4X3 LF (GAUZE/BANDAGES/DRESSINGS) ×1 IMPLANT
BNDG ELASTIC 2INX 5YD STR LF (GAUZE/BANDAGES/DRESSINGS) IMPLANT
BNDG ELASTIC 3INX 5YD STR LF (GAUZE/BANDAGES/DRESSINGS) ×1 IMPLANT
BNDG ELASTIC 4INX 5YD STR LF (GAUZE/BANDAGES/DRESSINGS) IMPLANT
BNDG ELASTIC 4X5.8 VLCR STR LF (GAUZE/BANDAGES/DRESSINGS) ×1 IMPLANT
BNDG GAUZE DERMACEA FLUFF 4 (GAUZE/BANDAGES/DRESSINGS) ×1 IMPLANT
CORD BIPOLAR FORCEPS 12FT (ELECTRODE) ×1 IMPLANT
COVER SURGICAL LIGHT HANDLE (MISCELLANEOUS) ×1 IMPLANT
CUFF TOURN SGL QUICK 18X4 (TOURNIQUET CUFF) ×1 IMPLANT
CUFF TRNQT CYL 24X4X16.5-23 (TOURNIQUET CUFF) IMPLANT
DRAIN PENROSE 12X.25 LTX STRL (MISCELLANEOUS) IMPLANT
DRAPE SURG 17X23 STRL (DRAPES) ×1 IMPLANT
DRSG ADAPTIC 3X8 NADH LF (GAUZE/BANDAGES/DRESSINGS) ×1 IMPLANT
ELECTRODE REM PT RTRN 9FT ADLT (ELECTROSURGICAL) IMPLANT
GAUZE SPONGE 4X4 12PLY STRL (GAUZE/BANDAGES/DRESSINGS) ×1 IMPLANT
GAUZE STRETCH 2X75IN STRL (MISCELLANEOUS) IMPLANT
GAUZE XEROFORM 1X8 LF (GAUZE/BANDAGES/DRESSINGS) ×1 IMPLANT
GAUZE XEROFORM 5X9 LF (GAUZE/BANDAGES/DRESSINGS) IMPLANT
GLOVE BIOGEL PI IND STRL 8.5 (GLOVE) ×1 IMPLANT
GLOVE PI ORTHO PRO STRL SZ8 (GLOVE) ×1 IMPLANT
GOWN STRL REUS W/ TWL LRG LVL3 (GOWN DISPOSABLE) ×3 IMPLANT
GOWN STRL REUS W/ TWL XL LVL3 (GOWN DISPOSABLE) ×1 IMPLANT
KIT BASIN OR (CUSTOM PROCEDURE TRAY) ×1 IMPLANT
KIT TURNOVER KIT B (KITS) ×1 IMPLANT
MANIFOLD NEPTUNE II (INSTRUMENTS) ×1 IMPLANT
NDL HYPO 25GX1X1/2 BEV (NEEDLE) IMPLANT
NEEDLE HYPO 25GX1X1/2 BEV (NEEDLE) IMPLANT
PACK ORTHO EXTREMITY (CUSTOM PROCEDURE TRAY) ×1 IMPLANT
PAD ARMBOARD POSITIONER FOAM (MISCELLANEOUS) ×2 IMPLANT
PAD CAST 4YDX4 CTTN HI CHSV (CAST SUPPLIES) ×1 IMPLANT
SET CYSTO IRRIGATION (SET/KITS/TRAYS/PACK) IMPLANT
SOAP 2 % CHG 4 OZ (WOUND CARE) ×1 IMPLANT
SOLN 0.9% NACL POUR BTL 1000ML (IV SOLUTION) ×1 IMPLANT
SOLN STERILE WATER BTL 1000 ML (IV SOLUTION) ×1 IMPLANT
SPONGE T-LAP 18X18 ~~LOC~~+RFID (SPONGE) ×1 IMPLANT
SPONGE T-LAP 4X18 ~~LOC~~+RFID (SPONGE) ×1 IMPLANT
SUT PROLENE 4 0 P 3 18 (SUTURE) IMPLANT
SWAB COLLECTION DEVICE MRSA (MISCELLANEOUS) ×1 IMPLANT
SWAB CULTURE ESWAB REG 1ML (MISCELLANEOUS) IMPLANT
SYR CONTROL 10ML LL (SYRINGE) IMPLANT
TOWEL GREEN STERILE (TOWEL DISPOSABLE) ×1 IMPLANT
TOWEL GREEN STERILE FF (TOWEL DISPOSABLE) ×1 IMPLANT
TUBE CONNECTING 12X1/4 (SUCTIONS) ×1 IMPLANT
UNDERPAD 30X36 HEAVY ABSORB (UNDERPADS AND DIAPERS) ×1 IMPLANT
YANKAUER SUCT BULB TIP NO VENT (SUCTIONS) ×1 IMPLANT

## 2024-11-29 NOTE — Op Note (Signed)
 PREOPERATIVE DIAGNOSIS: Left index finger dorsal abscess  POSTOPERATIVE DIAGNOSIS: Same  ATTENDING SURGEON: Dr. Prentice Pagan who scrubbed and present for the entire procedure  ASSISTANT SURGEON:None  ANESTHESIA: General Via LMA  OPERATIVE PROCEDURE: Left index incision and drainage complicated dorsal abscess  IMPLANTS: None  EBL: Minimal  RADIOGRAPHIC INTERPRETATION: None  SURGICAL INDICATIONS: Patient is a right-hand-dominant gentleman with a worsening infection of the dorsal aspect of the index finger.  Patient elected undergo the above procedure.  The risks of surgery include but not limited to bleeding infection damage nearby nerves arteries or tendons loss of motion of the wrist and digits incomplete release symptoms and need for further surgical invention.  SURGICAL TECHNIQUE: The patient was prepped identified in the preoperative holding area marked the prior marker made in the left index finger to indicate correct operative site.  Patient then brought back the operating placed supine on the anesthesia table where the general anesthetic was administered.  Patient tolerates well.  Well-padded tourniquet placed on left brachium seal with appropriate drape.  Left upper extremities prepped and draped normal sterile fashion.  Timeout was called the correct site identified procedure then began.  Attention then turned to the index finger longitudinal incision made directly to the dorsal aspect of the finger.  Dissection carried out through the skin and subtenons tissue.  The patient did have the moderate amount of purulence.  The wound cultures were then taken.  After wound cultures been taken excisional debridement of the devitalized tissue was then carried out with sharp scissors knife and rongeured.  The wound was then thoroughly irrigated.  The wound was then loosely reapproximated and closed with simple Prolene sutures.  It did not appear to be infiltrating the extensor mechanism.  Adaptic  dressing sterile compressive bandage was applied.  The patient tolerated the procedure well.  POSTOPERATIVE PLAN: Patient be discharged to home.  See him back in the office in Tuesday for wound check begin an outpatient whirlpool and wound care therapy regimen.

## 2024-11-29 NOTE — Anesthesia Procedure Notes (Signed)
 Procedure Name: LMA Insertion Date/Time: 11/29/2024 6:26 PM  Performed by: Mannie Krystal LABOR, CRNAPre-anesthesia Checklist: Patient identified, Emergency Drugs available, Suction available and Patient being monitored Patient Re-evaluated:Patient Re-evaluated prior to induction Oxygen Delivery Method: Circle system utilized Preoxygenation: Pre-oxygenation with 100% oxygen Induction Type: IV induction Ventilation: Mask ventilation without difficulty LMA: LMA inserted LMA Size: 4.0 Number of attempts: 1 Airway Equipment and Method: Oral airway Placement Confirmation: ETT inserted through vocal cords under direct vision, positive ETCO2 and breath sounds checked- equal and bilateral Tube secured with: Tape Dental Injury: Teeth and Oropharynx as per pre-operative assessment

## 2024-11-29 NOTE — Discharge Instructions (Signed)
 KEEP BANDAGE CLEAN AND DRY CALL OFFICE FOR F/U APPT [phone] ON TUESDAY 12/16 KEEP HAND ELEVATED ABOVE HEART OK TO APPLY ICE TO OPERATIVE AREA CONTACT OFFICE IF ANY WORSENING PAIN OR CONCERNS.

## 2024-11-29 NOTE — H&P (Signed)
 Mario [name] is an 78 y.o. male.   Chief Complaint: left index finger infection HPI: Pt seen /evaluated in office Pt here for surgery Pt with worsening left index finger infection  Past Medical History:  Diagnosis Date   Arrhythmia    Arthritis    Asthma    Atrial fibrillation (HCC)    Biliary colic 2014   Bronchiectasis (HCC)    2021   Cataract    COPD (chronic obstructive pulmonary disease) with chronic bronchitis (HCC)    Coronary artery calcification seen on CAT scan    Cyst of pancreas    1 cm (7/18), repeat ct in 12 months.     Hyperlipidemia    Hypertension    Obstructive sleep apnea    Renal disorder    Rhinitis    Sleep apnea    pt denies sleep apnea-lost weight    Past Surgical History:  Procedure Laterality Date   ATRIAL FIBRILLATION ABLATION N/A 09/19/2024   Procedure: ATRIAL FIBRILLATION ABLATION;  Surgeon: Nancey Eulas BRAVO, MD;  Location: [facility];  Service: Cardiovascular;  Laterality: N/A;   CARDIOVERSION N/A 05/14/2024   Procedure: CARDIOVERSION;  Surgeon: Michele Richardson, DO;  Location: [facility];  Service: Cardiovascular;  Laterality: N/A;   CATARACT EXTRACTION Right    CHOLECYSTECTOMY     COLONOSCOPY  09/29/2014   INCISION AND DRAINAGE OF WOUND Left 03/13/2024   Procedure: IRRIGATION AND DEBRIDEMENT WOUND;  Surgeon: Shari Easter, MD;  Location: [facility];  Service: Orthopedics;  Laterality: Left;  IRRIGATION AND DEBRIDEMENT FIFTH FINGER LEFT   IR RADIOLOGIST EVAL & MGMT  09/12/2024   LAPAROSCOPIC CHOLECYSTECTOMY SINGLE SITE WITH INTRAOPERATIVE CHOLANGIOGRAM N/A 09/15/2019   Procedure: LAPAROSCOPIC CHOLECYSTECTOMY SINGLE SITE;  Surgeon: Sheldon Standing, MD;  Location: [facility];  Service: General;  Laterality: N/A;   LITHOTRIPSY  2000   for kidney stone    Family History  Problem Relation Age of Onset   Rheumatic fever Mother    Anemia Mother    Stroke Father    Atrial fibrillation Father    Colon polyps Father    Arrhythmia Father     Arthritis Sister    Asthma Maternal Uncle    Colon cancer Neg Hx    Esophageal cancer Neg Hx    Rectal cancer Neg Hx    Stomach cancer Neg Hx    Social History:  reports that he quit smoking about 49 years ago. His smoking use included cigarettes. He started smoking about 60 years ago. He has a 22 pack-year smoking history. He has never used smokeless tobacco. He reports that he does not currently use alcohol after a past usage of about 1.0 standard drink of alcohol per week. He reports that he does not use drugs.  Allergies: Allergies[1]  Medications Prior to Admission  Medication Sig Dispense Refill   acetaminophen  (TYLENOL ) 500 MG tablet Take 1,000 mg by mouth every 6 (six) hours as needed for mild pain (pain score 1-3). (Patient taking differently: Take 1,000 mg by mouth as needed for mild pain (pain score 1-3).)     alendronate  (FOSAMAX ) 70 MG tablet Take 1 tablet (70 mg total) by mouth every 7 (seven) days. Take with a full glass of water  on an empty stomach. 4 tablet 11   Ascorbic Acid (VITAMIN C) 1000 MG tablet Take 1,000 mg by mouth 2 (two) times daily.     budesonide -glycopyrrolate-formoterol  (BREZTRI  AEROSPHERE) 160-9-4.8 MCG/ACT AERO inhaler Inhale 2 puffs into the lungs 2 (  two) times daily. 10.7 g 11   Cholecalciferol (VITAMIN D3) 125 MCG (5000 UT) TABS Take 2 tablets by mouth daily.     Coenzyme Q10 (CO Q-10) 100 MG CAPS Take 1 capsule by mouth daily.     diltiazem  (DILACOR XR ) 240 MG 24 hr capsule Take 1 capsule (240 mg total) by mouth daily. 90 capsule 1   ELIQUIS  5 MG TABS tablet TAKE 1 TABLET BY MOUTH TWICE A DAY 60 tablet 5   FIBER COMPLETE PO Take 1 tablet by mouth daily.     furosemide  (LASIX ) 40 MG tablet Take 1 tablet (40 mg total) by mouth daily. Take additional 20 mg if your weight is 3 lbs in 1 day of 5 lbs in 1 week 30 tablet 3   guaiFENesin  (MUCINEX ) 600 MG 12 hr tablet Take 1 tablet (600 mg total) by mouth 2 (two) times daily. 60 tablet 0   Iron , Ferrous  Sulfate, 325 (65 Fe) MG TABS Take 325 mg by mouth daily. 30 tablet 11   levalbuterol  (XOPENEX  HFA) 45 MCG/ACT inhaler Inhale 2 puffs into the lungs every 6 (six) hours as needed for wheezing. 1 each 12   levalbuterol  (XOPENEX ) 0.63 MG/3ML nebulizer solution Take 3 mLs (0.63 mg total) by nebulization 4 (four) times daily for 7 days, THEN 3 mLs (0.63 mg total) every 6 (six) hours as needed for wheezing or shortness of breath. 3 mL 3   levofloxacin  (LEVAQUIN ) 500 MG tablet Take 1 tablet (500 mg total) by mouth daily for 14 days. 14 tablet 0   metoprolol  succinate (TOPROL -XL) 100 MG 24 hr tablet Take 1 tablet (100 mg total) by mouth daily. Take with or immediately following a meal. 90 tablet 3   montelukast  (SINGULAIR ) 10 MG tablet TAKE 1 TABLET BY MOUTH EVERY DAY 90 tablet 2   pantoprazole  (PROTONIX ) 40 MG tablet TAKE 1 TABLET BY MOUTH TWICE A DAY 180 tablet 2   rosuvastatin  (CRESTOR ) 20 MG tablet TAKE 1 TABLET BY MOUTH EVERY DAY **D/C LIPITOR** 90 tablet 2   dorzolamide -timolol  (COSOPT ) 2-0.5 % ophthalmic solution Place 1 drop into the right eye 2 (two) times daily.     dupilumab  (DUPIXENT ) 300 MG/2ML prefilled syringe Inject 300 mg into the skin every 14 (fourteen) days. (Patient not taking: Reported on 11/29/2024) 4 mL 11   Respiratory Therapy Supplies (FLUTTER) DEVI Use as directed 1 each 0    No results found for this or any previous visit (from the past 48 hours). No results found.  ROS: no recent illnesses or hospitalizations  Blood pressure (!) 111/58, temperature 98.1 F (36.7 C), temperature source Oral, resp. rate 20, SpO2 95%. Physical Exam  General Appearance:  Alert, cooperative, no distress, appears stated age  Head:  Normocephalic, without obvious abnormality, atraumatic  Eyes:  Pupils equal, conjunctiva/corneas clear,         Throat: Lips, mucosa, and tongue normal; teeth and gums normal  Neck: No visible masses     Lungs:   respirations unlabored  Chest Wall:  No  tenderness or deformity  Heart:  Regular rate and rhythm,  Abdomen:   Soft, non-tender,         Extremities: lUE: PT WITH BANDAGE IN PLACE, FINGERS WARM WELL PERFUSED GOOD WRIST MOBILITY   Pulses: 2+ and symmetric  Skin: Skin color, texture, turgor normal, no rashes or lesions     Neurologic: Normal     Assessment/Plan LEFT INDEX FINGER INFECTION  LEFT INDEX FINGER INCISION AND DRAINAGE  R/B/A DISCUSSED WITH PT IN OFFICE.  PT VOICED UNDERSTANDING OF PLAN CONSENT SIGNED DAY OF SURGERY PT SEEN AND EXAMINED PRIOR TO OPERATIVE PROCEDURE/DAY OF SURGERY SITE MARKED. QUESTIONS ANSWERED WILL GO HOME FOLLOWING SURGERY   WE ARE PLANNING SURGERY FOR YOUR UPPER EXTREMITY. THE RISKS AND BENEFITS OF SURGERY INCLUDE BUT NOT LIMITED TO BLEEDING INFECTION, DAMAGE TO NEARBY NERVES ARTERIES TENDONS, FAILURE OF SURGERY TO ACCOMPLISH ITS INTENDED GOALS, PERSISTENT SYMPTOMS AND NEED FOR FURTHER SURGICAL INTERVENTION. WITH THIS IN MIND WE WILL PROCEED. I HAVE DISCUSSED WITH THE PATIENT THE PRE AND POSTOPERATIVE REGIMEN AND THE DOS AND DON'TS. PT VOICED UNDERSTANDING AND INFORMED CONSENT SIGNED.   Mario [name] 11/29/2024, 5:56 PM     [1]  Allergies Allergen Reactions   Dust Mite Extract Other (See Comments)    Asthma related

## 2024-11-29 NOTE — Anesthesia Postprocedure Evaluation (Signed)
 Anesthesia Post Note  Patient: Mario [name]  Procedure(s) Performed: INCISION AND DRAINAGE OF DEEP ABSCESS, HAND (Left: Hand)     Patient location during evaluation: PACU Anesthesia Type: General Level of consciousness: awake and alert Pain management: pain level controlled Vital Signs Assessment: post-procedure vital signs reviewed and stable Respiratory status: spontaneous breathing, nonlabored ventilation and respiratory function stable Cardiovascular status: blood pressure returned to baseline, stable and tachycardic Postop Assessment: no apparent nausea or vomiting Anesthetic complications: no Comments: Pt with h/o afib. HR in the 120's in PACU. Pt asymptomatic and BP normal. Discharged by PACU nurse.   No notable events documented.  Last Vitals:  Vitals:   11/29/24 1930 11/29/24 1944  BP: (!) 115/96 (!) 115/98  Pulse: (!) 120 (!) 128  Resp: 16 20  Temp:  36.6 C  SpO2: 94% 95%    Last Pain:  Vitals:   11/29/24 1944  TempSrc:   PainSc: 0-No pain                 Shyra Emile,W. EDMOND

## 2024-11-29 NOTE — Anesthesia Preprocedure Evaluation (Addendum)
 Anesthesia Evaluation  Patient identified by MRN, date of birth, ID band Patient awake    Reviewed: Allergy  & Precautions, NPO status , Patient's Chart, lab work & pertinent test results  Airway Mallampati: II  TM Distance: >3 FB Neck ROM: Full    Dental no notable dental hx.    Pulmonary asthma , sleep apnea , COPD, former smoker   Pulmonary exam normal        Cardiovascular hypertension, Pt. on medications and Pt. on home beta blockers + CAD and +CHF  + dysrhythmias Atrial Fibrillation  Rhythm:Irregular Rate:Normal  ECHO: IMPRESSIONS   1. Left ventricular ejection fraction, by estimation, is 50 to 55%. The  left ventricle has low normal function. Left ventricular endocardial  border not optimally defined to evaluate regional wall motion. There is  mild concentric left ventricular  hypertrophy. Left ventricular diastolic function could not be evaluated.   2. Right ventricular systolic function is low normal. The right  ventricular size is normal.   3. Left atrial size was moderately dilated.   4. The mitral valve is normal in structure. Trivial mitral valve  regurgitation. No evidence of mitral stenosis.   5. The aortic valve is grossly normal. There is mild calcification of the  aortic valve. There is mild thickening of the aortic valve. Aortic valve  regurgitation is not visualized. Aortic valve sclerosis/calcification is  present, without any evidence of  aortic stenosis.     Neuro/Psych negative neurological ROS  negative psych ROS   GI/Hepatic Neg liver ROS, hiatal hernia,,,  Endo/Other  negative endocrine ROS    Renal/GU Renal disease  negative genitourinary   Musculoskeletal  (+) Arthritis , Osteoarthritis,    Abdominal Normal abdominal exam  (+)   Peds  Hematology Lab Results      Component                Value               Date                      WBC                      12.6 (H)             11/29/2024                HGB                      16.2                11/29/2024                HCT                      51.7                11/29/2024                MCV                      96.6                11/29/2024                PLT                      292  11/29/2024              Anesthesia Other Findings   Reproductive/Obstetrics                              Anesthesia Physical Anesthesia Plan  ASA: 3  Anesthesia Plan: General   Post-op Pain Management:    Induction: Intravenous  PONV Risk Score and Plan: 2 and Ondansetron , Dexamethasone  and Treatment may vary due to age or medical condition  Airway Management Planned: Mask and LMA  Additional Equipment: None  Intra-op Plan:   Post-operative Plan: Extubation in OR  Informed Consent: I have reviewed the patients History and Physical, chart, labs and discussed the procedure including the risks, benefits and alternatives for the proposed anesthesia with the patient or authorized representative who has indicated his/her understanding and acceptance.     Dental advisory given  Plan Discussed with: CRNA  Anesthesia Plan Comments:         Anesthesia Quick Evaluation

## 2024-11-29 NOTE — Transfer of Care (Signed)
 Immediate Anesthesia Transfer of Care Note  Patient: Mario [name]  Procedure(s) Performed: INCISION AND DRAINAGE OF DEEP ABSCESS, HAND (Left: Hand)  Patient Location: PACU  Anesthesia Type:General  Level of Consciousness: awake, alert , and oriented  Airway & Oxygen Therapy: Patient Spontanous Breathing  Post-op Assessment: Report given to RN and Post -op Vital signs reviewed and stable  Post vital signs: Reviewed and stable  Last Vitals:  Vitals Value Taken Time  BP 107/63 11/29/24 19:05  Temp 36.6 C 11/29/24 19:05  Pulse 116 11/29/24 19:09  Resp 28 11/29/24 19:09  SpO2 94 % 11/29/24 19:09  Vitals shown include unfiled device data.  Last Pain:  Vitals:   11/29/24 1905  TempSrc:   PainSc: 0-No pain         Complications: No notable events documented.

## 2024-11-30 ENCOUNTER — Encounter (HOSPITAL_COMMUNITY): Payer: Self-pay | Admitting: Orthopedic Surgery

## 2024-12-03 ENCOUNTER — Encounter (HOSPITAL_COMMUNITY)

## 2024-12-03 DIAGNOSIS — L02512 Cutaneous abscess of left hand: Secondary | ICD-10-CM | POA: Insufficient documentation

## 2024-12-04 LAB — AEROBIC/ANAEROBIC CULTURE W GRAM STAIN (SURGICAL/DEEP WOUND): Culture: NORMAL

## 2024-12-05 ENCOUNTER — Encounter (HOSPITAL_COMMUNITY)

## 2024-12-06 ENCOUNTER — Encounter (HOSPITAL_COMMUNITY): Payer: Self-pay | Admitting: Orthopedic Surgery

## 2024-12-06 ENCOUNTER — Ambulatory Visit (HOSPITAL_COMMUNITY): Admit: 2024-12-06 | Admitting: Cardiovascular Disease

## 2024-12-06 ENCOUNTER — Encounter (HOSPITAL_COMMUNITY): Payer: Self-pay

## 2024-12-06 SURGERY — CARDIOVERSION (CATH LAB)
Anesthesia: Monitor Anesthesia Care

## 2024-12-06 NOTE — Progress Notes (Signed)
 PCP - Dr Butler Burr Cardiologist - Dr Bradly Lesches EP Dr Eulas Furbish  Chest x-ray - 10/21/24 EKG - 11/19/24 Stress Test - 05/15/19 ECHO - 04/16/24 Cardiac Cath - n/a  ICD Pacemaker/Loop - n/a  Sleep Study -  Yes CPAP - does not use CPAP, pt lost weight  Diabetes - n/a  Aspirin  - n/a  Eliquis  Instructions:  Last dose was on 12/05/24  ERAS - clear liquids til 6:25 AM DOS.  Anesthesia review: Yes, Dr Keneth informed by Montel, RN via phone.  Ok to proceed.  STOP now taking any Aspirin  (unless otherwise instructed by your surgeon), Aleve, Naproxen, Ibuprofen, Motrin, Advil, Goody's, BC's, all herbal medications, fish oil, and all vitamins.   Coronavirus Screening Do you have any of the following symptoms:  Cough Yes Fever (>100.13F)  yes/no: No Runny nose yes/no: No Sore throat yes/no: No Difficulty breathing/shortness of breath  Yes (COPD)  Have you traveled in the last 14 days and where? yes/no: No  Patient verbalized understanding of instructions that were given to them at the PAT appointment. Patient was also instructed that they will need to review over the PAT instructions again at home before surgery.

## 2024-12-06 NOTE — Anesthesia Preprocedure Evaluation (Signed)
 "                                  Anesthesia Evaluation  Patient identified by MRN, date of birth, ID band Patient awake    Reviewed: Allergy  & Precautions, NPO status , Patient's Chart, lab work & pertinent test results, reviewed documented beta blocker date and time   History of Anesthesia Complications Negative for: history of anesthetic complications  Airway Mallampati: II  TM Distance: >3 FB     Dental no notable dental hx.    Pulmonary asthma , sleep apnea and Continuous Positive Airway Pressure Ventilation , pneumonia, resolved, COPD, former smoker   Pulmonary exam normal        Cardiovascular hypertension, Pt. on medications and Pt. on home beta blockers + CAD and +CHF  + dysrhythmias Atrial Fibrillation  Rhythm:Regular Rate:Normal  ECHO: IMPRESSIONS   1. Left ventricular ejection fraction, by estimation, is 50 to 55%. The  left ventricle has low normal function. Left ventricular endocardial  border not optimally defined to evaluate regional wall motion. There is  mild concentric left ventricular  hypertrophy. Left ventricular diastolic function could not be evaluated.   2. Right ventricular systolic function is low normal. The right  ventricular size is normal.   3. Left atrial size was moderately dilated.   4. The mitral valve is normal in structure. Trivial mitral valve  regurgitation. No evidence of mitral stenosis.   5. The aortic valve is grossly normal. There is mild calcification of the  aortic valve. There is mild thickening of the aortic valve. Aortic valve  regurgitation is not visualized. Aortic valve sclerosis/calcification is  present, without any evidence of  aortic stenosis.     Neuro/Psych neg Seizures  Neuromuscular disease  negative psych ROS   GI/Hepatic Neg liver ROS, hiatal hernia,,,  Endo/Other  negative endocrine ROS    Renal/GU Renal disease  negative genitourinary   Musculoskeletal  (+) Arthritis , Osteoarthritis,     Abdominal Normal abdominal exam  (+)   Peds  Hematology Lab Results      Component                Value               Date                      WBC                      12.6 (H)            11/29/2024                HGB                      16.2                11/29/2024                HCT                      51.7                11/29/2024                MCV  96.6                11/29/2024                PLT                      292                 11/29/2024              Anesthesia Other Findings   Reproductive/Obstetrics                              Anesthesia Physical Anesthesia Plan  ASA: 3  Anesthesia Plan: General   Post-op Pain Management:    Induction: Intravenous  PONV Risk Score and Plan: 2 and Ondansetron , Dexamethasone  and Treatment may vary due to age or medical condition  Airway Management Planned: Mask and LMA  Additional Equipment: None  Intra-op Plan:   Post-operative Plan: Extubation in OR  Informed Consent: I have reviewed the patients History and Physical, chart, labs and discussed the procedure including the risks, benefits and alternatives for the proposed anesthesia with the patient or authorized representative who has indicated his/her understanding and acceptance.     Dental advisory given  Plan Discussed with: CRNA  Anesthesia Plan Comments:          Anesthesia Quick Evaluation  "

## 2024-12-07 ENCOUNTER — Encounter (HOSPITAL_COMMUNITY): Payer: Self-pay | Admitting: Orthopedic Surgery

## 2024-12-07 ENCOUNTER — Encounter (HOSPITAL_COMMUNITY)
Admission: AD | Disposition: A | Discharge status: Home Health | Payer: Self-pay | Source: Home / Self Care | Attending: Family Medicine

## 2024-12-07 ENCOUNTER — Other Ambulatory Visit: Payer: Self-pay

## 2024-12-07 ENCOUNTER — Inpatient Hospital Stay (HOSPITAL_COMMUNITY)
Admission: AD | Admit: 2024-12-07 | Discharge: 2024-12-11 | DRG: 580 | Disposition: A | Discharge status: Home Health | Attending: Family Medicine | Admitting: Family Medicine

## 2024-12-07 ENCOUNTER — Ambulatory Visit (HOSPITAL_COMMUNITY): Payer: Self-pay | Admitting: Anesthesiology

## 2024-12-07 ENCOUNTER — Inpatient Hospital Stay (HOSPITAL_COMMUNITY): Payer: Self-pay | Admitting: Anesthesiology

## 2024-12-07 DIAGNOSIS — Z83719 Family history of colon polyps, unspecified: Secondary | ICD-10-CM | POA: Diagnosis not present

## 2024-12-07 DIAGNOSIS — Z87891 Personal history of nicotine dependence: Secondary | ICD-10-CM | POA: Diagnosis not present

## 2024-12-07 DIAGNOSIS — R197 Diarrhea, unspecified: Secondary | ICD-10-CM | POA: Diagnosis present

## 2024-12-07 DIAGNOSIS — L089 Local infection of the skin and subcutaneous tissue, unspecified: Secondary | ICD-10-CM | POA: Diagnosis present

## 2024-12-07 DIAGNOSIS — Z8261 Family history of arthritis: Secondary | ICD-10-CM | POA: Diagnosis not present

## 2024-12-07 DIAGNOSIS — Z823 Family history of stroke: Secondary | ICD-10-CM | POA: Diagnosis not present

## 2024-12-07 DIAGNOSIS — Z825 Family history of asthma and other chronic lower respiratory diseases: Secondary | ICD-10-CM | POA: Diagnosis not present

## 2024-12-07 DIAGNOSIS — E785 Hyperlipidemia, unspecified: Secondary | ICD-10-CM | POA: Diagnosis present

## 2024-12-07 DIAGNOSIS — I959 Hypotension, unspecified: Secondary | ICD-10-CM | POA: Diagnosis present

## 2024-12-07 DIAGNOSIS — I38 Endocarditis, valve unspecified: Secondary | ICD-10-CM | POA: Diagnosis not present

## 2024-12-07 DIAGNOSIS — I4819 Other persistent atrial fibrillation: Secondary | ICD-10-CM | POA: Diagnosis present

## 2024-12-07 DIAGNOSIS — Z01818 Encounter for other preprocedural examination: Principal | ICD-10-CM

## 2024-12-07 DIAGNOSIS — I5031 Acute diastolic (congestive) heart failure: Secondary | ICD-10-CM

## 2024-12-07 DIAGNOSIS — L02512 Cutaneous abscess of left hand: Principal | ICD-10-CM | POA: Diagnosis present

## 2024-12-07 DIAGNOSIS — S61402A Unspecified open wound of left hand, initial encounter: Secondary | ICD-10-CM | POA: Diagnosis not present

## 2024-12-07 DIAGNOSIS — G4733 Obstructive sleep apnea (adult) (pediatric): Secondary | ICD-10-CM | POA: Diagnosis present

## 2024-12-07 DIAGNOSIS — J4489 Other specified chronic obstructive pulmonary disease: Secondary | ICD-10-CM | POA: Diagnosis present

## 2024-12-07 DIAGNOSIS — I2510 Atherosclerotic heart disease of native coronary artery without angina pectoris: Secondary | ICD-10-CM

## 2024-12-07 DIAGNOSIS — I110 Hypertensive heart disease with heart failure: Secondary | ICD-10-CM

## 2024-12-07 DIAGNOSIS — Z79899 Other long term (current) drug therapy: Secondary | ICD-10-CM

## 2024-12-07 DIAGNOSIS — M7989 Other specified soft tissue disorders: Secondary | ICD-10-CM | POA: Diagnosis not present

## 2024-12-07 DIAGNOSIS — Z7951 Long term (current) use of inhaled steroids: Secondary | ICD-10-CM | POA: Diagnosis not present

## 2024-12-07 DIAGNOSIS — M79645 Pain in left finger(s): Secondary | ICD-10-CM | POA: Diagnosis present

## 2024-12-07 DIAGNOSIS — W232XXA Caught, crushed, jammed or pinched between a moving and stationary object, initial encounter: Secondary | ICD-10-CM | POA: Diagnosis present

## 2024-12-07 DIAGNOSIS — D72829 Elevated white blood cell count, unspecified: Secondary | ICD-10-CM | POA: Diagnosis present

## 2024-12-07 DIAGNOSIS — S6990XA Unspecified injury of unspecified wrist, hand and finger(s), initial encounter: Secondary | ICD-10-CM | POA: Diagnosis present

## 2024-12-07 DIAGNOSIS — M79642 Pain in left hand: Secondary | ICD-10-CM | POA: Diagnosis present

## 2024-12-07 DIAGNOSIS — Z87442 Personal history of urinary calculi: Secondary | ICD-10-CM | POA: Diagnosis not present

## 2024-12-07 DIAGNOSIS — Z7983 Long term (current) use of bisphosphonates: Secondary | ICD-10-CM

## 2024-12-07 DIAGNOSIS — Z7901 Long term (current) use of anticoagulants: Secondary | ICD-10-CM

## 2024-12-07 HISTORY — PX: INCISION AND DRAINAGE OF DEEP ABSCESS, HAND: SHX7323

## 2024-12-07 LAB — CBC WITH DIFFERENTIAL/PLATELET
Abs Immature Granulocytes: 0.3 K/uL — ABNORMAL HIGH (ref 0.00–0.07)
Basophils Absolute: 0.1 K/uL (ref 0.0–0.1)
Basophils Relative: 1 %
Eosinophils Absolute: 0 K/uL (ref 0.0–0.5)
Eosinophils Relative: 0 %
HCT: 45.1 % (ref 39.0–52.0)
Hemoglobin: 14.6 g/dL (ref 13.0–17.0)
Immature Granulocytes: 3 %
Lymphocytes Relative: 8 %
Lymphs Abs: 0.9 K/uL (ref 0.7–4.0)
MCH: 30.8 pg (ref 26.0–34.0)
MCHC: 32.4 g/dL (ref 30.0–36.0)
MCV: 95.1 fL (ref 80.0–100.0)
Monocytes Absolute: 1 K/uL (ref 0.1–1.0)
Monocytes Relative: 8 %
Neutro Abs: 9.5 K/uL — ABNORMAL HIGH (ref 1.7–7.7)
Neutrophils Relative %: 80 %
Platelets: 346 K/uL (ref 150–400)
RBC: 4.74 MIL/uL (ref 4.22–5.81)
RDW: 19.2 % — ABNORMAL HIGH (ref 11.5–15.5)
WBC: 11.8 K/uL — ABNORMAL HIGH (ref 4.0–10.5)
nRBC: 0 % (ref 0.0–0.2)

## 2024-12-07 LAB — POCT I-STAT, CHEM 8
BUN: 28 mg/dL — ABNORMAL HIGH (ref 8–23)
Calcium, Ion: 1.14 mmol/L — ABNORMAL LOW (ref 1.15–1.40)
Chloride: 103 mmol/L (ref 98–111)
Creatinine, Ser: 1 mg/dL (ref 0.61–1.24)
Glucose, Bld: 104 mg/dL — ABNORMAL HIGH (ref 70–99)
HCT: 50 % (ref 39.0–52.0)
Hemoglobin: 17 g/dL (ref 13.0–17.0)
Potassium: 3.7 mmol/L (ref 3.5–5.1)
Sodium: 140 mmol/L (ref 135–145)
TCO2: 23 mmol/L (ref 22–32)

## 2024-12-07 LAB — SEDIMENTATION RATE: Sed Rate: 44 mm/h — ABNORMAL HIGH (ref 0–16)

## 2024-12-07 LAB — C-REACTIVE PROTEIN: CRP: 12.4 mg/dL — ABNORMAL HIGH

## 2024-12-07 MED ORDER — 0.9 % SODIUM CHLORIDE (POUR BTL) OPTIME
TOPICAL | Status: DC | PRN
  Administered 2024-12-07: 1000 mL

## 2024-12-07 MED ORDER — OXYCODONE HCL 5 MG/5ML PO SOLN: 5.0000 mg | Freq: Once | ORAL | Status: DC | PRN

## 2024-12-07 MED ORDER — ALBUTEROL SULFATE (2.5 MG/3ML) 0.083% IN NEBU
INHALATION_SOLUTION | RESPIRATORY_TRACT | Status: AC
  Filled 2024-12-07: qty 3

## 2024-12-07 MED ORDER — FLUTICASONE FUROATE-VILANTEROL 200-25 MCG/ACT IN AEPB
1.0000 | INHALATION_SPRAY | Freq: Every day | RESPIRATORY_TRACT | Status: DC
  Administered 2024-12-07 – 2024-12-11 (×4): 1 via RESPIRATORY_TRACT
  Filled 2024-12-07: qty 28

## 2024-12-07 MED ORDER — CHLORHEXIDINE GLUCONATE 0.12 % MT SOLN
OROMUCOSAL | Status: AC
  Filled 2024-12-07: qty 15

## 2024-12-07 MED ORDER — FENTANYL CITRATE (PF) 250 MCG/5ML IJ SOLN
INTRAMUSCULAR | Status: AC
  Filled 2024-12-07: qty 5

## 2024-12-07 MED ORDER — ACETAMINOPHEN 650 MG RE SUPP: 650.0000 mg | Freq: Four times a day (QID) | RECTAL | Status: DC | PRN

## 2024-12-07 MED ORDER — DILTIAZEM HCL ER COATED BEADS 120 MG PO CP24
240.0000 mg | ORAL_CAPSULE | Freq: Every day | ORAL | Status: DC
  Administered 2024-12-07 – 2024-12-11 (×5): 240 mg via ORAL
  Filled 2024-12-07 (×5): qty 2

## 2024-12-07 MED ORDER — ONDANSETRON HCL 4 MG/2ML IJ SOLN: 4.0000 mg | Freq: Four times a day (QID) | INTRAMUSCULAR | Status: DC | PRN

## 2024-12-07 MED ORDER — ORAL CARE MOUTH RINSE: 15.0000 mL | Freq: Once | OROMUCOSAL | Status: AC

## 2024-12-07 MED ORDER — SACCHAROMYCES BOULARDII 250 MG PO CAPS
250.0000 mg | ORAL_CAPSULE | Freq: Two times a day (BID) | ORAL | Status: DC
  Administered 2024-12-07 – 2024-12-11 (×9): 250 mg via ORAL
  Filled 2024-12-07 (×10): qty 1

## 2024-12-07 MED ORDER — FENTANYL CITRATE (PF) 100 MCG/2ML IJ SOLN: 25.0000 ug | INTRAMUSCULAR | Status: DC | PRN

## 2024-12-07 MED ORDER — AMOXICILLIN-POT CLAVULANATE 875-125 MG PO TABS
1.0000 | ORAL_TABLET | Freq: Two times a day (BID) | ORAL | Status: DC
  Administered 2024-12-07 – 2024-12-11 (×9): 1 via ORAL
  Filled 2024-12-07 (×8): qty 1

## 2024-12-07 MED ORDER — PROPOFOL 10 MG/ML IV BOLUS
INTRAVENOUS | Status: DC | PRN
  Administered 2024-12-07: 100 mg via INTRAVENOUS

## 2024-12-07 MED ORDER — LACTATED RINGERS IV SOLN: INTRAVENOUS | Status: DC | PRN

## 2024-12-07 MED ORDER — ROSUVASTATIN CALCIUM 20 MG PO TABS
20.0000 mg | ORAL_TABLET | Freq: Every day | ORAL | Status: DC
  Administered 2024-12-07 – 2024-12-11 (×5): 20 mg via ORAL
  Filled 2024-12-07 (×5): qty 1

## 2024-12-07 MED ORDER — PROPOFOL 10 MG/ML IV BOLUS
INTRAVENOUS | Status: AC
  Filled 2024-12-07: qty 20

## 2024-12-07 MED ORDER — ALBUTEROL SULFATE (2.5 MG/3ML) 0.083% IN NEBU
2.5000 mg | INHALATION_SOLUTION | Freq: Four times a day (QID) | RESPIRATORY_TRACT | Status: DC | PRN
  Administered 2024-12-07 – 2024-12-11 (×2): 2.5 mg via RESPIRATORY_TRACT
  Filled 2024-12-07 (×2): qty 3

## 2024-12-07 MED ORDER — OXYCODONE HCL 5 MG PO TABS: 5.0000 mg | ORAL_TABLET | Freq: Once | ORAL | Status: DC | PRN

## 2024-12-07 MED ORDER — METOPROLOL SUCCINATE ER 100 MG PO TB24
100.0000 mg | ORAL_TABLET | Freq: Every day | ORAL | Status: DC
  Administered 2024-12-07 – 2024-12-11 (×4): 100 mg via ORAL
  Filled 2024-12-07 (×5): qty 1

## 2024-12-07 MED ORDER — ONDANSETRON HCL 4 MG/2ML IJ SOLN: 4.0000 mg | Freq: Once | INTRAMUSCULAR | Status: DC | PRN

## 2024-12-07 MED ORDER — DILTIAZEM HCL ER 240 MG PO CP24
240.0000 mg | ORAL_CAPSULE | Freq: Every day | ORAL | Status: DC
  Filled 2024-12-07: qty 1
  Filled 2024-12-07: qty 2

## 2024-12-07 MED ORDER — PANTOPRAZOLE SODIUM 40 MG PO TBEC
40.0000 mg | DELAYED_RELEASE_TABLET | Freq: Two times a day (BID) | ORAL | Status: DC
  Administered 2024-12-07 – 2024-12-11 (×9): 40 mg via ORAL
  Filled 2024-12-07 (×9): qty 1

## 2024-12-07 MED ORDER — PHENYLEPHRINE 80 MCG/ML (10ML) SYRINGE FOR IV PUSH (FOR BLOOD PRESSURE SUPPORT)
PREFILLED_SYRINGE | INTRAVENOUS | Status: DC | PRN
  Administered 2024-12-07: 160 ug via INTRAVENOUS
  Administered 2024-12-07: 120 ug via INTRAVENOUS

## 2024-12-07 MED ORDER — LACTATED RINGERS IV SOLN: INTRAVENOUS | Status: DC

## 2024-12-07 MED ORDER — LINEZOLID 600 MG PO TABS
600.0000 mg | ORAL_TABLET | Freq: Two times a day (BID) | ORAL | Status: DC
  Administered 2024-12-07 – 2024-12-11 (×9): 600 mg via ORAL
  Filled 2024-12-07 (×10): qty 1

## 2024-12-07 MED ORDER — FUROSEMIDE 40 MG PO TABS
40.0000 mg | ORAL_TABLET | Freq: Every day | ORAL | Status: DC
  Filled 2024-12-07 (×4): qty 1

## 2024-12-07 MED ORDER — ACETAMINOPHEN 325 MG PO TABS
650.0000 mg | ORAL_TABLET | Freq: Four times a day (QID) | ORAL | Status: DC | PRN
  Administered 2024-12-07 – 2024-12-10 (×5): 650 mg via ORAL
  Filled 2024-12-07 (×5): qty 2

## 2024-12-07 MED ORDER — ONDANSETRON HCL 4 MG PO TABS: 4.0000 mg | ORAL_TABLET | Freq: Four times a day (QID) | ORAL | Status: DC | PRN

## 2024-12-07 MED ORDER — ACETAMINOPHEN 10 MG/ML IV SOLN: 1000.0000 mg | Freq: Once | INTRAVENOUS | Status: DC | PRN

## 2024-12-07 MED ORDER — LIDOCAINE 2% (20 MG/ML) 5 ML SYRINGE
INTRAMUSCULAR | Status: DC | PRN
  Administered 2024-12-07: 30 mg via INTRAVENOUS

## 2024-12-07 MED ORDER — SODIUM CHLORIDE 0.9 % IR SOLN
Status: DC | PRN
  Administered 2024-12-07: 3000 mL

## 2024-12-07 MED ORDER — CHLORHEXIDINE GLUCONATE 0.12 % MT SOLN
15.0000 mL | Freq: Once | OROMUCOSAL | Status: AC
  Administered 2024-12-07: 15 mL via OROMUCOSAL

## 2024-12-07 MED ORDER — ALBUTEROL SULFATE (2.5 MG/3ML) 0.083% IN NEBU
2.5000 mg | INHALATION_SOLUTION | Freq: Once | RESPIRATORY_TRACT | Status: AC
  Administered 2024-12-07: 2.5 mg via RESPIRATORY_TRACT

## 2024-12-07 MED ORDER — ENSURE PLUS HIGH PROTEIN PO LIQD
237.0000 mL | Freq: Two times a day (BID) | ORAL | Status: DC
  Administered 2024-12-07 – 2024-12-11 (×9): 237 mL via ORAL

## 2024-12-07 NOTE — Transfer of Care (Signed)
 Immediate Anesthesia Transfer of Care Note  Patient: Mario [name]  Procedure(s) Performed: INCISION AND DRAINAGE OF DEEP ABSCESS, HAND (Left: Hand)  Patient Location: PACU  Anesthesia Type:General  Level of Consciousness: awake, alert , and oriented  Airway & Oxygen Therapy: Patient Spontanous Breathing and Patient connected to nasal cannula oxygen  Post-op Assessment: Report given to RN and Post -op Vital signs reviewed and stable  Post vital signs: Reviewed and stable  Last Vitals:  Vitals Value Taken Time  BP 100/85 12/07/24 10:45  Temp 36.4 C 12/07/24 10:35  Pulse 92 12/07/24 10:52  Resp 17 12/07/24 10:52  SpO2 95 % 12/07/24 10:52  Vitals shown include unfiled device data.  Last Pain:  Vitals:   12/07/24 1045  TempSrc:   PainSc: 0-No pain      Patients Stated Pain Goal: 3 (12/07/24 0708)  Complications: No notable events documented.

## 2024-12-07 NOTE — Plan of Care (Signed)
  Problem: Activity: Goal: Risk for activity intolerance will decrease Outcome: Progressing   Problem: Elimination: Goal: Will not experience complications related to bowel motility Outcome: Progressing   Problem: Pain Managment: Goal: General experience of comfort will improve and/or be controlled Outcome: Progressing   Problem: Safety: Goal: Ability to remain free from injury will improve Outcome: Progressing

## 2024-12-07 NOTE — Anesthesia Postprocedure Evaluation (Signed)
"   Anesthesia Post Note  Patient: Mario [name]  Procedure(s) Performed: INCISION AND DRAINAGE OF DEEP ABSCESS, HAND (Left: Hand)     Patient location during evaluation: PACU Anesthesia Type: General Level of consciousness: awake and alert Pain management: pain level controlled Vital Signs Assessment: post-procedure vital signs reviewed and stable Respiratory status: spontaneous breathing, nonlabored ventilation, respiratory function stable and patient connected to nasal cannula oxygen Cardiovascular status: blood pressure returned to baseline and stable Postop Assessment: no apparent nausea or vomiting Anesthetic complications: no   No notable events documented.  Last Vitals:  Vitals:   12/07/24 1115 12/07/24 1133  BP:  (!) 144/104  Pulse:  97  Resp: (!) 22 18  Temp:  (!) 36.3 C  SpO2: 95% 94%    Last Pain:  Vitals:   12/07/24 1133  TempSrc: Oral  PainSc:                  Lynwood MARLA Cornea      "

## 2024-12-07 NOTE — Consult Note (Signed)
 Initial Consultation Note   Patient: Mario [name] FMW:[id number] DOB: [birth year] PCP: [name] DASEN, MD DOA: 12/07/2024 DOS: the patient was seen and examined on 12/07/2024 Primary service: Shari Easter, MD  Referring physician: Easter Shari, MD  Reason for consult: Medical management  Assessment/Plan: Assessment and Plan:  Left hand swelling Patient presents with complaints of left hand swelling.  He had been on doxycycline  over the last week without improvement in symptoms.  Patient was taken to the operating room for debridement which did not reveal any purulent drainage and biopsy.  Cultures were sent, but prior cultures did not show any growth.  Question the possibility of infectious process versus inflammatory. - Admit to a medical telemetry bed - Follow-up surgical cultures and biopsy - Check ESR (44) and CRP (12.4) - Start empiric antibiotics of linezolid  and Augmentin  as recommended by ID - Wound care per Hand surgery  Leukocytosis Acute.  WBC elevated 11.8.  Thought reactive to above. - Recheck CBC tomorrow morning.  Paroxysmal atrial fibrillation Noted to be in atrial fibrillation currently.  Home medication regimen includes Eliquis  for which patient lasted the medication 2 days ago. - continue current medication regimen - Continue to hold Eliquis  due to open wound.  Resume when deemed medically appropriate  COPD Patient without significant wheezing or rhonchi appreciated at this time. - Continue breathing treatments as needed  TRH will continue to follow the patient.  HPI: Mario [name] is a 78 y.o. male with past medical history of hypertension, hyperlipidemia, persistent atrial fibrillation, bronchiectasis, and OSA no longer on CPAP who presents with hand swelling.  He has been experiencing hand swelling for the past couple of weeks without recalling any specific inciting event such as an insect bite. A similar episode occurred earlier this year, which  resolved after surgical intervention.  He is on Eliquis  for atrial fibrillation and has not taken his blood pressure medication this morning. He denies the use of CPAP at night and has no history of diabetes. He has been on doxycycline  for the last week.  Review of Systems: As mentioned in the history of present illness. All other systems reviewed and are negative. Past Medical History:  Diagnosis Date   Arrhythmia    Arthritis    Asthma    Atrial fibrillation (HCC)    Biliary colic 2014   Bronchiectasis (HCC)    2021   Bronchiectasis (HCC) 11/18/2024   Cataract    COPD (chronic obstructive pulmonary disease) with chronic bronchitis (HCC)    Coronary artery calcification seen on CAT scan    Cyst of pancreas    1 cm (7/18), repeat ct in 12 months.     Hyperlipidemia    Hypertension    Obstructive sleep apnea    Pneumonia 10/24/2024   left lower lobe   Renal disorder    Rhinitis    Sleep apnea    pt denies sleep apnea-lost weight   Past Surgical History:  Procedure Laterality Date   ATRIAL FIBRILLATION ABLATION N/A 09/19/2024   Procedure: ATRIAL FIBRILLATION ABLATION;  Surgeon: Nancey Eulas BRAVO, MD;  Location: [facility];  Service: Cardiovascular;  Laterality: N/A;   CARDIOVERSION N/A 05/14/2024   Procedure: CARDIOVERSION;  Surgeon: Michele Richardson, DO;  Location: [facility];  Service: Cardiovascular;  Laterality: N/A;   CATARACT EXTRACTION Right    CHOLECYSTECTOMY     COLONOSCOPY  09/29/2014   INCISION AND DRAINAGE OF DEEP ABSCESS, HAND Left 11/29/2024   Procedure: INCISION AND  DRAINAGE OF DEEP ABSCESS, HAND;  Surgeon: Shari Easter, MD;  Location: [facility] OR;  Service: Orthopedics;  Laterality: Left;   INCISION AND DRAINAGE OF WOUND Left 03/13/2024   Procedure: IRRIGATION AND DEBRIDEMENT WOUND;  Surgeon: Shari Easter, MD;  Location: [facility] OR;  Service: Orthopedics;  Laterality: Left;  IRRIGATION AND DEBRIDEMENT FIFTH FINGER LEFT   IR RADIOLOGIST EVAL & MGMT   09/12/2024   LAPAROSCOPIC CHOLECYSTECTOMY SINGLE SITE WITH INTRAOPERATIVE CHOLANGIOGRAM N/A 09/15/2019   Procedure: LAPAROSCOPIC CHOLECYSTECTOMY SINGLE SITE;  Surgeon: Sheldon Standing, MD;  Location: [facility]S;  Service: General;  Laterality: N/A;   LITHOTRIPSY  2000   for kidney stone   Social History:  reports that he quit smoking about 50 years ago. His smoking use included cigarettes. He started smoking about 61 years ago. He has a 22 pack-year smoking history. He has never used smokeless tobacco. He reports that he does not currently use alcohol after a past usage of about 1.0 standard drink of alcohol per week. He reports that he does not use drugs.  Allergies[1]  Family History  Problem Relation Age of Onset   Rheumatic fever Mother    Anemia Mother    Stroke Father    Atrial fibrillation Father    Colon polyps Father    Arrhythmia Father    Arthritis Sister    Asthma Maternal Uncle    Colon cancer Neg Hx    Esophageal cancer Neg Hx    Rectal cancer Neg Hx    Stomach cancer Neg Hx     Prior to Admission medications  Medication Sig Start Date End Date Taking? Authorizing Provider  acetaminophen  (TYLENOL ) 500 MG tablet Take 1,000 mg by mouth every 6 (six) hours as needed for mild pain (pain score 1-3). Patient taking differently: Take 1,000 mg by mouth every 8 (eight) hours as needed for mild pain (pain score 1-3).   Yes [provider]  alendronate  (FOSAMAX ) 70 MG tablet Take 1 tablet (70 mg total) by mouth every 7 (seven) days. Take with a full glass of water  on an empty stomach. Patient taking differently: Take 70 mg by mouth every 7 (seven) days. Take with a full glass of water  on an empty stomach on Sundays 09/06/24  Yes Pickard, Butler DASEN, MD  budesonide -formoterol  (SYMBICORT ) 160-4.5 MCG/ACT inhaler Inhale 2 puffs into the lungs 2 (two) times daily. 11/23/24  Yes [provider]  diltiazem  (DILACOR XR ) 240 MG 24 hr capsule Take 1 capsule (240 mg total) by mouth  daily. 10/24/24  Yes [name] DASEN, MD  dorzolamide -timolol  (COSOPT ) 2-0.5 % ophthalmic solution Place 1 drop into the right eye daily. 10/26/24  Yes [provider]  doxycycline  (VIBRAMYCIN ) 100 MG capsule Take 100 mg by mouth 2 (two) times daily. 11/29/24  Yes [provider]  ELIQUIS  5 MG TABS tablet TAKE 1 TABLET BY MOUTH TWICE A DAY 08/23/24  Yes Court Dorn PARAS, MD  FIBER COMPLETE PO Take 1 tablet by mouth daily as needed (Constipation).   Yes [provider]  furosemide  (LASIX ) 40 MG tablet Take 1 tablet (40 mg total) by mouth daily. Take additional 20 mg if your weight is 3 lbs in 1 day of 5 lbs in 1 week 10/21/24  Yes [name] DASEN, MD  guaiFENesin  (MUCINEX ) 600 MG 12 hr tablet Take 1 tablet (600 mg total) by mouth 2 (two) times daily. 04/18/24  Yes Tobie Yetta HERO, MD  Iron , Ferrous Sulfate , 325 (65 Fe) MG TABS Take 325 mg  by mouth daily. 09/06/24  Yes [name] DASEN, MD  levalbuterol  (XOPENEX  HFA) 45 MCG/ACT inhaler Inhale 2 puffs into the lungs every 6 (six) hours as needed for wheezing. 04/26/24  Yes [name] DASEN, MD  levalbuterol  (XOPENEX ) 0.63 MG/3ML nebulizer solution Take 3 mLs (0.63 mg total) by nebulization 4 (four) times daily for 7 days, THEN 3 mLs (0.63 mg total) every 6 (six) hours as needed for wheezing or shortness of breath. 10/21/24 12/06/24 Yes [name] DASEN, MD  metoprolol  succinate (TOPROL -XL) 100 MG 24 hr tablet Take 1 tablet (100 mg total) by mouth daily. Take with or immediately following a meal. 04/26/24  Yes Vicci Sauer R, PA-C  montelukast  (SINGULAIR ) 10 MG tablet TAKE 1 TABLET BY MOUTH EVERY DAY 06/03/24  Yes [name] DASEN, MD  Multiple Vitamins-Minerals (MULTIVITAMIN WITH MINERALS) tablet Take 1 tablet by mouth daily.   Yes [provider]  pantoprazole  (PROTONIX ) 40 MG tablet TAKE 1 TABLET BY MOUTH TWICE A DAY 06/03/24  Yes [name] DASEN, MD  Respiratory Therapy Supplies (FLUTTER) DEVI Use as directed  07/05/18  Yes Darlean Ozell NOVAK, MD  rosuvastatin  (CRESTOR ) 20 MG tablet TAKE 1 TABLET BY MOUTH EVERY DAY **D/C LIPITOR** 10/21/24  Yes [name] DASEN, MD  budesonide -glycopyrrolate-formoterol  (BREZTRI  AEROSPHERE) 160-9-4.8 MCG/ACT AERO inhaler Inhale 2 puffs into the lungs 2 (two) times daily. Patient not taking: Reported on 12/06/2024 10/24/24   [name] DASEN, MD  dupilumab  (DUPIXENT ) 300 MG/2ML prefilled syringe Inject 300 mg into the skin every 14 (fourteen) days. Patient not taking: Reported on 11/29/2024 09/17/24   Maurilio Camellia PARAS, MD    Physical Exam: Vitals:   12/07/24 0708  BP: (!) 145/115  Pulse: 95  Resp: 18  Temp: 97.8 F (36.6 C)  TempSrc: Oral  SpO2: 95%  Weight: 88.9 kg  Height: 5' 8.5 (1.74 m)   Constitutional: Elderly male currently in PACU following recent surgery Eyes: PERRL, lids and conjunctivae normal ENMT: Mucous membranes are moist. Posterior pharynx clear of any exudate or lesions.Normal dentition.  Neck: normal, supple, no masses, no thyromegaly Respiratory: clear to auscultation bilaterally, no wheezing, no crackles. Normal respiratory effort. No accessory muscle use.  Cardiovascular: Regular rate and rhythm, no murmurs / rubs / gallops.   2+ pedal pulses. No carotid bruits.  Abdomen: no tenderness, no masses palpated.  . Bowel sounds positive.  Musculoskeletal: no clubbing / cyanosis.   Skin: Left hand currently bandaged. Neurologic: CN 2-12 grossly intac  Strength 5/5 in all 4.  Psychiatric: Lethargic easily arousable  Data Reviewed:   Reviewed labs and pertinent labs   Family Communication: Primary team communication: Thank you very much for involving us  in the care of your patient.  Author: Maximino DELENA Sharps, MD 12/07/2024 9:33 AM  For on call review [email or web address].      [1]  Allergies Allergen Reactions   Dust Mite Extract Other (See Comments)    Asthma related

## 2024-12-07 NOTE — Op Note (Signed)
 PREOPERATIVE DIAGNOSIS: Left index finger inflammatory reaction and possible infection  POSTOPERATIVE DIAGNOSIS: Same  ATTENDING SURGEON: Dr. Prentice Pagan who scrubbed and present for the entire procedure  ASSISTANT SURGEON: None  ANESTHESIA: General Via LMA  OPERATIVE PROCEDURE: Debridement of skin subcutaneous tissue excisional debridement left index finger Left index finger skin biopsy  IMPLANTS: None  EBL: Minimal  RADIOGRAPHIC INTERPRETATION: None  SURGICAL INDICATIONS: Patient is a right-hand-dominant gentleman with a worsening inflammatory/infectious process over the dorsal aspect of the hand.  Patient is scheduled undergo the above procedure.  The risks of surgery include but not limited to bleeding infection damage nearby nerves arteries or tendons loss of motion of the wrist and digits incomplete relief of symptoms and need for further surgical invention.  SURGICAL TECHNIQUE: Patient was prepped identified in the preoperative holding area marked the prior marker made in left index finger indicate correct operative site.  Patient then brought back the operating placed supine on the anesthesia table where the general anesthetic was administered.  Patient tolerates well.  Well-padded tourniquet placed on the left forearm and seal with appropriate drape.  Left upper extremities then prepped and draped normal sterile fashion.  Timeout was called the correct site identified procedure then began.  The patient did have the epidermal lysis with the significant soft tissue sloughing over the dorsal aspect of the hand there was no purulence.  Skin was then sent for culture skin biopsies around the hemorrhage or hemorrhagic lesions were then taken and sent to pathology.  The wound was thoroughly irrigated.  After excisional debridement with a knife and curettes of the sloughing/devitalized tissue the wound was irrigated once again.  Xeroform dressings were then applied.  Sterile compressive  bandage was applied.  The patient was then placed in a bulky hand dressing taken recovery room extubated in good condition.  Intraoperative cultures of a aerobic anaerobic Mycobacterium atypical Mycobacterium and fungus were also taken today.  POSTOPERATIVE PLAN: Patient be admitted for consultation with the internist and infectious disease.  Again the wounds did not look purulent cultures initially from last week did not grow anything cultures were redone today we sent new cultures today.  We sent skin specimens looking for the inflammatory leukocyte reactive autoimmune condition.  Continue with the inpatient care we will continue to follow him.

## 2024-12-07 NOTE — Anesthesia Procedure Notes (Signed)
 Procedure Name: LMA Insertion Date/Time: 12/07/2024 9:50 AM  Performed by: Evette Ade, CRNAPre-anesthesia Checklist: Patient identified, Emergency Drugs available, Patient being monitored and Timeout performed Patient Re-evaluated:Patient Re-evaluated prior to induction Oxygen Delivery Method: Circle system utilized Preoxygenation: Pre-oxygenation with 100% oxygen Induction Type: IV induction LMA: LMA inserted LMA Size: 4.0 Number of attempts: 1 Placement Confirmation: positive ETCO2 and breath sounds checked- equal and bilateral Tube secured with: Tape Dental Injury: Teeth and Oropharynx as per pre-operative assessment

## 2024-12-07 NOTE — H&P (Signed)
 JEREMAIH KLIMA is an 78 y.o. male.   Chief Complaint: left hand swelling and pain HPI: Pt with worsening hand condition No bacterial growthy Has been on abx Plan for repeat I/D and skin biopsies to r/o inflammatory condition  Past Medical History:  Diagnosis Date   Arrhythmia    Arthritis    Asthma    Atrial fibrillation (HCC)    Biliary colic 2014   Bronchiectasis (HCC)    2021   Bronchiectasis (HCC) 11/18/2024   Cataract    COPD (chronic obstructive pulmonary disease) with chronic bronchitis (HCC)    Coronary artery calcification seen on CAT scan    Cyst of pancreas    1 cm (7/18), repeat ct in 12 months.     Hyperlipidemia    Hypertension    Obstructive sleep apnea    Pneumonia 10/24/2024   left lower lobe   Renal disorder    Rhinitis    Sleep apnea    pt denies sleep apnea-lost weight    Past Surgical History:  Procedure Laterality Date   ATRIAL FIBRILLATION ABLATION N/A 09/19/2024   Procedure: ATRIAL FIBRILLATION ABLATION;  Surgeon: Nancey Eulas BRAVO, MD;  Location: [facility];  Service: Cardiovascular;  Laterality: N/A;   CARDIOVERSION N/A 05/14/2024   Procedure: CARDIOVERSION;  Surgeon: Michele Richardson, DO;  Location: [facility];  Service: Cardiovascular;  Laterality: N/A;   CATARACT EXTRACTION Right    CHOLECYSTECTOMY     COLONOSCOPY  09/29/2014   INCISION AND DRAINAGE OF DEEP ABSCESS, HAND Left 11/29/2024   Procedure: INCISION AND DRAINAGE OF DEEP ABSCESS, HAND;  Surgeon: Shari Easter, MD;  Location: [facility] OR;  Service: Orthopedics;  Laterality: Left;   INCISION AND DRAINAGE OF WOUND Left 03/13/2024   Procedure: IRRIGATION AND DEBRIDEMENT WOUND;  Surgeon: Shari Easter, MD;  Location: [facility] OR;  Service: Orthopedics;  Laterality: Left;  IRRIGATION AND DEBRIDEMENT FIFTH FINGER LEFT   IR RADIOLOGIST EVAL & MGMT  09/12/2024   LAPAROSCOPIC CHOLECYSTECTOMY SINGLE SITE WITH INTRAOPERATIVE CHOLANGIOGRAM N/A 09/15/2019   Procedure: LAPAROSCOPIC  CHOLECYSTECTOMY SINGLE SITE;  Surgeon: Sheldon Standing, MD;  Location: [facility];  Service: General;  Laterality: N/A;   LITHOTRIPSY  2000   for kidney stone    Family History  Problem Relation Age of Onset   Rheumatic fever Mother    Anemia Mother    Stroke Father    Atrial fibrillation Father    Colon polyps Father    Arrhythmia Father    Arthritis Sister    Asthma Maternal Uncle    Colon cancer Neg Hx    Esophageal cancer Neg Hx    Rectal cancer Neg Hx    Stomach cancer Neg Hx    Social History:  reports that he quit smoking about 50 years ago. His smoking use included cigarettes. He started smoking about 61 years ago. He has a 22 pack-year smoking history. He has never used smokeless tobacco. He reports that he does not currently use alcohol after a past usage of about 1.0 standard drink of alcohol per week. He reports that he does not use drugs.  Allergies: Allergies[1]  Medications Prior to Admission  Medication Sig Dispense Refill   acetaminophen  (TYLENOL ) 500 MG tablet Take 1,000 mg by mouth every 6 (six) hours as needed for mild pain (pain score 1-3). (Patient taking differently: Take 1,000 mg by mouth every 8 (eight) hours as needed for mild pain (pain score 1-3).)     alendronate  (FOSAMAX ) 70 MG tablet  Take 1 tablet (70 mg total) by mouth every 7 (seven) days. Take with a full glass of water  on an empty stomach. (Patient taking differently: Take 70 mg by mouth every 7 (seven) days. Take with a full glass of water  on an empty stomach on Sundays) 4 tablet 11   budesonide -formoterol  (SYMBICORT ) 160-4.5 MCG/ACT inhaler Inhale 2 puffs into the lungs 2 (two) times daily.     diltiazem  (DILACOR XR ) 240 MG 24 hr capsule Take 1 capsule (240 mg total) by mouth daily. 90 capsule 1   dorzolamide -timolol  (COSOPT ) 2-0.5 % ophthalmic solution Place 1 drop into the right eye daily.     doxycycline  (VIBRAMYCIN ) 100 MG capsule Take 100 mg by mouth 2 (two) times daily.     ELIQUIS  5 MG TABS tablet  TAKE 1 TABLET BY MOUTH TWICE A DAY 60 tablet 5   FIBER COMPLETE PO Take 1 tablet by mouth daily as needed (Constipation).     furosemide  (LASIX ) 40 MG tablet Take 1 tablet (40 mg total) by mouth daily. Take additional 20 mg if your weight is 3 lbs in 1 day of 5 lbs in 1 week 30 tablet 3   guaiFENesin  (MUCINEX ) 600 MG 12 hr tablet Take 1 tablet (600 mg total) by mouth 2 (two) times daily. 60 tablet 0   Iron , Ferrous Sulfate , 325 (65 Fe) MG TABS Take 325 mg by mouth daily. 30 tablet 11   levalbuterol  (XOPENEX  HFA) 45 MCG/ACT inhaler Inhale 2 puffs into the lungs every 6 (six) hours as needed for wheezing. 1 each 12   levalbuterol  (XOPENEX ) 0.63 MG/3ML nebulizer solution Take 3 mLs (0.63 mg total) by nebulization 4 (four) times daily for 7 days, THEN 3 mLs (0.63 mg total) every 6 (six) hours as needed for wheezing or shortness of breath. 3 mL 3   metoprolol  succinate (TOPROL -XL) 100 MG 24 hr tablet Take 1 tablet (100 mg total) by mouth daily. Take with or immediately following a meal. 90 tablet 3   montelukast  (SINGULAIR ) 10 MG tablet TAKE 1 TABLET BY MOUTH EVERY DAY 90 tablet 2   Multiple Vitamins-Minerals (MULTIVITAMIN WITH MINERALS) tablet Take 1 tablet by mouth daily.     pantoprazole  (PROTONIX ) 40 MG tablet TAKE 1 TABLET BY MOUTH TWICE A DAY 180 tablet 2   Respiratory Therapy Supplies (FLUTTER) DEVI Use as directed 1 each 0   rosuvastatin  (CRESTOR ) 20 MG tablet TAKE 1 TABLET BY MOUTH EVERY DAY **D/C LIPITOR** 90 tablet 2   budesonide -glycopyrrolate-formoterol  (BREZTRI  AEROSPHERE) 160-9-4.8 MCG/ACT AERO inhaler Inhale 2 puffs into the lungs 2 (two) times daily. (Patient not taking: Reported on 12/06/2024) 10.7 g 11   dupilumab  (DUPIXENT ) 300 MG/2ML prefilled syringe Inject 300 mg into the skin every 14 (fourteen) days. (Patient not taking: Reported on 11/29/2024) 4 mL 11    Results for orders placed or performed during the hospital encounter of 12/07/24 (from the past 48 hours)  I-STAT, chem 8      Status: Abnormal   Collection Time: 12/07/24  7:49 AM  Result Value Ref Range   Sodium 140 135 - 145 mmol/L   Potassium 3.7 3.5 - 5.1 mmol/L   Chloride 103 98 - 111 mmol/L   BUN 28 (H) 8 - 23 mg/dL   Creatinine, Ser 8.99 0.61 - 1.24 mg/dL   Glucose, Bld 895 (H) 70 - 99 mg/dL    Comment: Glucose reference range applies only to samples taken after fasting for at least 8 hours.   Calcium , Ion 1.14 (  L) 1.15 - 1.40 mmol/L   TCO2 23 22 - 32 mmol/L   Hemoglobin 17.0 13.0 - 17.0 g/dL   HCT 49.9 60.9 - 47.9 %   No results found.  ROS as noted in chart  Blood pressure (!) 145/115, pulse 95, temperature 97.8 F (36.6 C), temperature source Oral, resp. rate 18, height 5' 8.5 (1.74 m), weight 88.9 kg, SpO2 95%. Physical Exam  General Appearance:  Alert, cooperative, no distress, appears stated age  Head:  Normocephalic, without obvious abnormality, atraumatic  Eyes:  Pupils equal, conjunctiva/corneas clear,         Throat: Lips, mucosa, and tongue normal; teeth and gums normal  Neck: No visible masses     Lungs:   respirations unlabored  Chest Wall:  No tenderness or deformity  Heart:  Regular rate and rhythm,  Abdomen:   Soft, non-tender,         Extremities: LUE: hemorrhagic blistering over dorsum of index and long with large swelling and serous blisters Fingers warm well perfused Able to wiggle fingers    Pulses: 2+ and symmetric  Skin: Skin color, texture, turgor normal, no rashes or lesions     Neurologic: Normal     Assessment/Plan Left hand infection versus inflammatory leukocyte skin lesion  Plan for repeat irrigation and debridement and formal skin biopsy  R/B/A DISCUSSED WITH PT IN OFFICE.  PT VOICED UNDERSTANDING OF PLAN CONSENT SIGNED DAY OF SURGERY PT SEEN AND EXAMINED PRIOR TO OPERATIVE PROCEDURE/DAY OF SURGERY SITE MARKED. QUESTIONS ANSWERED WILL REMAIN AN INPATIENT FOLLOWING SURGERY LIKELY MEDICINE ADMIT TO CONSIDER STEROIDS AND ID CONSULT ABOUT  ANTIBIOTICS  WE ARE PLANNING SURGERY FOR YOUR UPPER EXTREMITY. THE RISKS AND BENEFITS OF SURGERY INCLUDE BUT NOT LIMITED TO BLEEDING INFECTION, DAMAGE TO NEARBY NERVES ARTERIES TENDONS, FAILURE OF SURGERY TO ACCOMPLISH ITS INTENDED GOALS, PERSISTENT SYMPTOMS AND NEED FOR FURTHER SURGICAL INTERVENTION. WITH THIS IN MIND WE WILL PROCEED. I HAVE DISCUSSED WITH THE PATIENT THE PRE AND POSTOPERATIVE REGIMEN AND THE DOS AND DON'TS. PT VOICED UNDERSTANDING AND INFORMED CONSENT SIGNED.   Prentice ORN Aminah Zabawa 12/07/2024, 8:30 AM     [1]  Allergies Allergen Reactions   Dust Mite Extract Other (See Comments)    Asthma related

## 2024-12-07 NOTE — Plan of Care (Signed)

## 2024-12-08 ENCOUNTER — Encounter (HOSPITAL_COMMUNITY): Payer: Self-pay | Admitting: Orthopedic Surgery

## 2024-12-08 DIAGNOSIS — M7989 Other specified soft tissue disorders: Secondary | ICD-10-CM | POA: Diagnosis not present

## 2024-12-08 LAB — CBC
HCT: 40.5 % (ref 39.0–52.0)
Hemoglobin: 12.8 g/dL — ABNORMAL LOW (ref 13.0–17.0)
MCH: 30.3 pg (ref 26.0–34.0)
MCHC: 31.6 g/dL (ref 30.0–36.0)
MCV: 95.7 fL (ref 80.0–100.0)
Platelets: 287 K/uL (ref 150–400)
RBC: 4.23 MIL/uL (ref 4.22–5.81)
RDW: 19 % — ABNORMAL HIGH (ref 11.5–15.5)
WBC: 9.9 K/uL (ref 4.0–10.5)
nRBC: 0 % (ref 0.0–0.2)

## 2024-12-08 LAB — BASIC METABOLIC PANEL WITH GFR
Anion gap: 9 (ref 5–15)
BUN: 25 mg/dL — ABNORMAL HIGH (ref 8–23)
CO2: 24 mmol/L (ref 22–32)
Calcium: 8.4 mg/dL — ABNORMAL LOW (ref 8.9–10.3)
Chloride: 104 mmol/L (ref 98–111)
Creatinine, Ser: 1 mg/dL (ref 0.61–1.24)
GFR, Estimated: >60 mL/min
Glucose, Bld: 89 mg/dL (ref 70–99)
Potassium: 3.7 mmol/L (ref 3.5–5.1)
Sodium: 137 mmol/L (ref 135–145)

## 2024-12-08 LAB — HEPATITIS PANEL, ACUTE
HCV Ab: NONREACTIVE
Hep A IgM: NONREACTIVE
Hep B C IgM: NONREACTIVE
Hepatitis B Surface Ag: NONREACTIVE

## 2024-12-08 MED ORDER — HYDRALAZINE HCL 50 MG PO TABS
50.0000 mg | ORAL_TABLET | Freq: Three times a day (TID) | ORAL | Status: DC
  Administered 2024-12-08 – 2024-12-11 (×7): 50 mg via ORAL
  Filled 2024-12-08 (×8): qty 1

## 2024-12-08 MED ORDER — ALBUMIN HUMAN 25 % IV SOLN
12.5000 g | Freq: Once | INTRAVENOUS | Status: AC
  Administered 2024-12-08: 12.5 g via INTRAVENOUS
  Filled 2024-12-08: qty 50

## 2024-12-08 NOTE — Progress Notes (Addendum)
" ° ° ° °  Patient Name: Mario [name]           DOB: 09/15/1946  MRN: [id number]      Admission Date: 12/07/2024  Attending Provider: Claudene Maximino LABOR, MD  Primary Diagnosis: Swelling of left hand   Level of care: Telemetry   OVERNIGHT EVENT   Notified by bedside RN that BP is low during routine vital sign assessment, SBP 80s.    Borderline BP/mild hypotension No acute changes reported.  Appears to be asymptomatic to current BP.   Vitals stable, afebrile, WBC 12.6.  Receiving empiric antibiotics for hand infection.   12/20 - OR by Ortho for debridement of extremity.  No significant blood loss reported during procedure. No concerns for bleeding reported by RN, last hemoglobin 17.0 Unknown amount of fluids given under anesthesia.  Patient has history of CHF.  No sepsis concern now, so will be precautions with fluids.  Will instead administer IV albumin  for borderline BP   Plan: IV albumin  CBC Nursing staff to reassess BP every 2 hours x 2   Addendum: BP improving with IV albumin .  Hemoglobin 14.7   Lavanda Horns, DNP, ACNPC- AG [facility]    "

## 2024-12-08 NOTE — Progress Notes (Signed)
 CHART REVIEWED CULTURES REVIEWED NO GROWTH ON ABX UNTIL CULTURES AND TISSUE SPECIMENS COME BACK WILL CONTINUE TO FOLLOW WILL FOLLOW PATHOLOGY PT SEEN/EXAMINED

## 2024-12-08 NOTE — Progress Notes (Signed)
 " PROGRESS NOTE    Mario [name]  FMW:[id number] DOB: 1946/11/03 DOA: 12/07/2024 PCP: [name] DASEN, MD   Brief Narrative:  This 78 y.o. male with PMH significant for hypertension, hyperlipidemia, persistent atrial fibrillation, bronchiectasis, and OSA no longer on CPAP who presents with Left hand swelling. He has been experiencing hand swelling for the past couple of weeks without recalling any specific inciting event such as an insect bite. A similar episode occurred earlier this year, which resolved after surgical intervention. He is on Eliquis  for atrial fibrillation and has not taken his blood pressure medication this morning. He denies the use of CPAP at night and has no history of diabetes. He has been on doxycycline  for the last week.  Patient was admitted under orthopedics.   Assessment & Plan:   Principal Problem:   Swelling of left hand   Left hand swelling: Patient presented with left hand swelling for 2 weeks.   He had been on doxycycline  over the last week without improvement in symptoms.   Patient was taken to the operating room for debridement which did not reveal any purulent drainage and biopsy was done.   Cultures were sent, but prior cultures did not show any growth.  Possibility of infectious process versus inflammatory. ESR (44) and CRP (12.4) Continue empiric antibiotics (linezolid  and Augmentin ) as recommended by ID Wound care per Hand surgery.   Leukocytosis: Leukocytosis resolved after debridement.   Paroxysmal atrial fibrillation: Noted to be in atrial fibrillation currently.  Patient took Eliquis  2 days ago. Continue metoprolol  100 mg daily Continue Cardizem  CD to 40 mg daily Continue to hold Eliquis  due to open wound.  Resume when deemed medically appropriate   COPD; Patient without significant wheezing or rhonchi appreciated at this time. Continue breathing treatments as needed   DVT prophylaxis: SCDs Code Status: Full code Family  Communication: No family at bed side. Disposition Plan:     Status is: Inpatient Remains inpatient appropriate because: Patient admitted for left hand infection,  underwent I&D.  Started on empiric antibiotics,  pending  cultures.  Patient not medically ready for discharge.    Consultants:  Hand Surgery  Procedures: I & D  Antimicrobials:  Anti-infectives (From admission, onward)    Start     Dose/Rate Route Frequency Ordered Stop   12/07/24 1545  linezolid  (ZYVOX ) tablet 600 mg        600 mg Oral Every 12 hours 12/07/24 1456     12/07/24 1545  amoxicillin -clavulanate (AUGMENTIN ) 875-125 MG per tablet 1 tablet        1 tablet Oral Every 12 hours 12/07/24 1456         Subjective: Patient was seen and examined at bedside.Overnight events noted. Patient reports feeling much improved, status post I&D.  Left hand in dressing.  Reports pain is controlled  Objective: Vitals:   12/07/24 2337 12/08/24 0204 12/08/24 0500 12/08/24 0957  BP: (!) 88/70 103/71 (!) 109/98 (!) 140/118  Pulse: 81 86 90 83  Resp:  15 18 20   Temp:  (!) 97.5 F (36.4 C) 97.9 F (36.6 C) 98.2 F (36.8 C)  TempSrc:   Oral   SpO2:  98% 98% 95%  Weight:      Height:        Intake/Output Summary (Last 24 hours) at 12/08/2024 1337 Last data filed at 12/08/2024 0500 Gross per 24 hour  Intake 1317.83 ml  Output 300 ml  Net 1017.83 ml   Filed Weights   12/07/24  0708  Weight: 88.9 kg    Examination:  General exam: Appears calm and comfortable, not in any acute distress.  Respiratory system: CTA Bilaterally . Respiratory effort normal. RR 15 Cardiovascular system: S1 & S2 heard, RRR. No JVD, murmurs, rubs, gallops or clicks.  Gastrointestinal system: Abdomen is non distended, soft and non tender.  Normal bowel sounds heard. Central nervous system: Alert and oriented x 3. No focal neurological deficits. Extremities: No edema, no cyanosis, no clubbing.  Left hand in dressing. Skin: No rashes, lesions  or ulcers Psychiatry: Judgement and insight appear normal. Mood & affect appropriate.     Data Reviewed: I have personally reviewed following labs and imaging studies  CBC: Recent Labs  Lab 12/07/24 0749 12/07/24 1158 12/08/24 0529  WBC  --  11.8* 9.9  NEUTROABS  --  9.5*  --   HGB 17.0 14.6 12.8*  HCT 50.0 45.1 40.5  MCV  --  95.1 95.7  PLT  --  346 287   Basic Metabolic Panel: Recent Labs  Lab 12/07/24 0749 12/08/24 0529  NA 140 137  K 3.7 3.7  CL 103 104  CO2  --  24  GLUCOSE 104* 89  BUN 28* 25*  CREATININE 1.00 1.00  CALCIUM   --  8.4*   GFR: Estimated Creatinine Clearance: 65.5 mL/min (by C-G formula based on SCr of 1 mg/dL). Liver Function Tests: No results for input(s): AST, ALT, ALKPHOS, BILITOT, PROT, ALBUMIN  in the last 168 hours. No results for input(s): LIPASE, AMYLASE in the last 168 hours. No results for input(s): AMMONIA in the last 168 hours. Coagulation Profile: No results for input(s): INR, PROTIME in the last 168 hours. Cardiac Enzymes: No results for input(s): CKTOTAL, CKMB, CKMBINDEX, TROPONINI in the last 168 hours. BNP (last 3 results) No results for input(s): PROBNP in the last 8760 hours. HbA1C: No results for input(s): HGBA1C in the last 72 hours. CBG: No results for input(s): GLUCAP in the last 168 hours. Lipid Profile: No results for input(s): CHOL, HDL, LDLCALC, TRIG, CHOLHDL, LDLDIRECT in the last 72 hours. Thyroid  Function Tests: No results for input(s): TSH, T4TOTAL, FREET4, T3FREE, THYROIDAB in the last 72 hours. Anemia Panel: No results for input(s): VITAMINB12, FOLATE, FERRITIN, TIBC, IRON , RETICCTPCT in the last 72 hours. Sepsis Labs: No results for input(s): PROCALCITON, LATICACIDVEN in the last 168 hours.  Recent Results (from the past 240 hours)  Aerobic/Anaerobic Culture w Gram Stain (surgical/deep wound)     Status: None   Collection Time:  11/29/24  6:35 PM   Specimen: Path fluid; Body Fluid  Result Value Ref Range Status   Specimen Description FLUID  Final   Special Requests DEEP ABSCESS DRAINAGE LEFT HAND  Final   Gram Stain   Final    RARE WBC PRESENT, PREDOMINANTLY PMN NO ORGANISMS SEEN    Culture   Final    RARE NORMAL SKIN FLORA NO ANAEROBES ISOLATED Performed at [facility], [address], [place], [place] [address]    Report Status 12/04/2024 FINAL  Final  Aerobic/Anaerobic Culture w Gram Stain (surgical/deep wound)     Status: None (Preliminary result)   Collection Time: 12/07/24 10:32 AM   Specimen: Wound; Tissue  Result Value Ref Range Status   Specimen Description WOUND  Final   Special Requests DEEP ABSCESS HAND  Final   Gram Stain   Final    RARE WBC PRESENT, PREDOMINANTLY PMN NO ORGANISMS SEEN    Culture   Final  NO GROWTH < 24 HOURS Performed at [facility], [address], [place], [place] [address]    Report Status PENDING  Incomplete  Aerobic/Anaerobic Culture w Gram Stain (surgical/deep wound)     Status: None (Preliminary result)   Collection Time: 12/07/24 10:32 AM   Specimen: Wound; Tissue  Result Value Ref Range Status   Specimen Description TISSUE  Final   Special Requests HAND WOUND  Final   Gram Stain   Final    RARE WBC PRESENT, PREDOMINANTLY PMN NO ORGANISMS SEEN    Culture   Final    TOO YOUNG TO READ Performed at [facility], [address], [place], [place] [address]    Report Status PENDING  Incomplete  Aerobic/Anaerobic Culture w Gram Stain (surgical/deep wound)     Status: None (Preliminary result)   Collection Time: 12/07/24 10:39 AM   Specimen: Wound  Result Value Ref Range Status   Specimen Description TISSUE  Final   Special Requests DEEP ABSCESS HAND  Final   Gram Stain   Final    FEW WBC PRESENT, PREDOMINANTLY PMN NO ORGANISMS SEEN    Culture   Final    NO GROWTH < 24 HOURS Performed at [facility], [address]  [place], [place] [address]    Report Status PENDING  Incomplete   Radiology Studies: No results found.  Scheduled Meds:  amoxicillin -clavulanate  1 tablet Oral Q12H   diltiazem   240 mg Oral Daily   feeding supplement  237 mL Oral BID BM   fluticasone  furoate-vilanterol  1 puff Inhalation Daily   furosemide   40 mg Oral Daily   hydrALAZINE   50 mg Oral Q8H   linezolid   600 mg Oral Q12H   metoprolol  succinate  100 mg Oral Daily   pantoprazole   40 mg Oral BID   rosuvastatin   20 mg Oral Daily   saccharomyces boulardii  250 mg Oral BID   Continuous Infusions:   LOS: 1 day    Time spent: 50 Mins    Darcel Dawley, MD [facility]   If 7PM-7AM, please contact night-coverage  "

## 2024-12-08 NOTE — Plan of Care (Signed)

## 2024-12-08 NOTE — Consult Note (Signed)
 "        [facility]    Date of Admission:  12/07/2024   Total days of inpatient antibiotics 2        Reason for Consult:  Left hand wound   Principal Problem:   Swelling of left hand   Assessment: 78 year old male with history of A-fib, CAD, COPD, bronchiectasis admitted with left hand wound.  Underwent debridement #Left hand/index finger inflammatory reaction with possible infection status post debridement on 12/07/2024 - Patient states he has had wounds open up on his hand since last March.  He had initial wound on his hand that worsened in March -.  He states he had an I&D on 3/26 with Dr. Ahmad for left small finger abscess.  He reports following debridement and wound care his wound closed.  About 2 weeks ago he started having another wound on his left hand which he thinks started after he closed the door on his hand.  Wound is painful.  Required I&D on 12/20 with purulence noted with hand surgery, culture showed normal skin flora.  Required another I&D on 12/20 with dehiscence tissue.  ID was called recommended biopsy, AFB fungal and bacterial cultures. - Spoke with patient and he has had a new wound on his right finger.  He states that that is how his wound starts.  Denies any prior history of wound starting in this manner. Recommendations:  -Start linezolid  + augmentin  -Await biopsy , I think this likely inflammatory. Hold off steroids if stable till biopsy return. - Blood cultures -Acute hep panel , ANA - Discussed plan with primary - Standard precautions Microbiology:   Antibiotics:   Cultures: Blood  Urine  Other 11/29/24 ->normal  skin flora 12/07/24 afb/fungal/bacterial cx HPI: Mario [name] is a 78 y.o. male With past medical history of A-fib, hyperlipidemia, bronchiectasis, hypertension, OSA, hyperlipidemia, COPD admitted for left hand wound.  Patient has been sick for little by hand surgery.  He had undergone I&D on 12/12 of the  left index finger dorsal abscess.,  Noted moderate amount of purulence cultures showed normal skin flora.  He was discharged doxycycline .  Wound did not improve underwent repeat I&D and skin biopsies of left hand.  Noted significant sloughing without purulence.  ID engaged for antibiotic recommendations.   Review of Systems: Review of Systems  All other systems reviewed and are negative.   Past Medical History:  Diagnosis Date   Arrhythmia    Arthritis    Asthma    Atrial fibrillation (HCC)    Biliary colic 2014   Bronchiectasis (HCC)    2021   Bronchiectasis (HCC) 11/18/2024   Cataract    COPD (chronic obstructive pulmonary disease) with chronic bronchitis (HCC)    Coronary artery calcification seen on CAT scan    Cyst of pancreas    1 cm (7/18), repeat ct in 12 months.     Hyperlipidemia    Hypertension    Obstructive sleep apnea    Pneumonia 10/24/2024   left lower lobe   Renal disorder    Rhinitis    Sleep apnea    pt denies sleep apnea-lost weight    Social History[1]  Family History  Problem Relation Age of Onset   Rheumatic fever Mother    Anemia Mother    Stroke Father    Atrial fibrillation Father    Colon polyps Father    Arrhythmia Father    Arthritis Sister    Asthma Maternal Uncle  Colon cancer Neg Hx    Esophageal cancer Neg Hx    Rectal cancer Neg Hx    Stomach cancer Neg Hx    Scheduled Meds:  amoxicillin -clavulanate  1 tablet Oral Q12H   diltiazem   240 mg Oral Daily   feeding supplement  237 mL Oral BID BM   fluticasone  furoate-vilanterol  1 puff Inhalation Daily   furosemide   40 mg Oral Daily   linezolid   600 mg Oral Q12H   metoprolol  succinate  100 mg Oral Daily   pantoprazole   40 mg Oral BID   rosuvastatin   20 mg Oral Daily   saccharomyces boulardii  250 mg Oral BID   Continuous Infusions: PRN Meds:.acetaminophen  **OR** acetaminophen , albuterol , ondansetron  **OR** ondansetron  (ZOFRAN ) IV Allergies[2]  OBJECTIVE: Blood pressure  (!) 140/118, pulse 83, temperature 98.2 F (36.8 C), resp. rate 20, height 5' 8.5 (1.74 m), weight 88.9 kg, SpO2 95%.  Physical Exam Constitutional:      General: He is not in acute distress.    Appearance: He is normal weight. He is not toxic-appearing.  HENT:     Head: Normocephalic and atraumatic.     Right Ear: External ear normal.     Left Ear: External ear normal.     Nose: No congestion or rhinorrhea.     Mouth/Throat:     Mouth: Mucous membranes are moist.     Pharynx: Oropharynx is clear.  Eyes:     Extraocular Movements: Extraocular movements intact.     Conjunctiva/sclera: Conjunctivae normal.     Pupils: Pupils are equal, round, and reactive to light.  Cardiovascular:     Rate and Rhythm: Normal rate and regular rhythm.     Heart sounds: No murmur heard.    No friction rub. No gallop.  Pulmonary:     Effort: Pulmonary effort is normal.     Breath sounds: Normal breath sounds.  Abdominal:     General: Abdomen is flat. Bowel sounds are normal.     Palpations: Abdomen is soft.  Musculoskeletal:     Cervical back: Normal range of motion and neck supple.     Comments: LUE bandaged  Skin:    General: Skin is warm and dry.  Neurological:     General: No focal deficit present.     Mental Status: He is oriented to person, place, and time.  Psychiatric:        Mood and Affect: Mood normal.     Lab Results Lab Results  Component Value Date   WBC 9.9 12/08/2024   HGB 12.8 (L) 12/08/2024   HCT 40.5 12/08/2024   MCV 95.7 12/08/2024   PLT 287 12/08/2024    Lab Results  Component Value Date   CREATININE 1.00 12/08/2024   BUN 25 (H) 12/08/2024   NA 137 12/08/2024   K 3.7 12/08/2024   CL 104 12/08/2024   CO2 24 12/08/2024    Lab Results  Component Value Date   ALT 57 (H) 05/07/2024   AST 37 (H) 05/07/2024   ALKPHOS 41 04/17/2024   BILITOT 0.6 05/07/2024       Loney Stank, MD [facility] 12/08/2024, 10:09 AM Evaluation of this patient requires complex antimicrobial therapy evaluation and counseling + isolation needs for disease transmission risk assessment and mitigation      [1]  Social History Tobacco Use   Smoking status: Former    Current packs/day: 0.00    Average packs/day: 2.0 packs/day  for 11.0 years (22.0 ttl pk-yrs)    Types: Cigarettes    Start date: 12/20/1963    Quit date: 12/19/1974    Years since quitting: 50.0   Smokeless tobacco: Never  Vaping Use   Vaping status: Never Used  Substance Use Topics   Alcohol use: Not Currently    Alcohol/week: 1.0 standard drink of alcohol   Drug use: No  [2]  Allergies Allergen Reactions   Dust Mite Extract Other (See Comments)    Asthma related   "

## 2024-12-09 DIAGNOSIS — M7989 Other specified soft tissue disorders: Secondary | ICD-10-CM | POA: Diagnosis not present

## 2024-12-09 LAB — ANTINUCLEAR ANTIBODIES, IFA: ANA Ab, IFA: NEGATIVE

## 2024-12-09 NOTE — Progress Notes (Signed)
 "        [facility]  Date of Admission:  12/07/2024    Principal Problem:   Swelling of left hand          Assessment: 78 year old male with history of A-fib, CAD, COPD, bronchiectasis admitted with left hand wound.  Underwent debridement #Left hand/index finger inflammatory reaction with possible infection status post debridement on 12/07/2024 - Patient states he has had wounds open up on his hand since last March.  He had initial wound on his hand that worsened in March -.  He states he had an I&D on 3/26 with Dr. Ahmad for left small finger abscess.  He reports following debridement and wound care his wound closed.  About 2 weeks ago he started having another wound on his left hand which he thinks started after he closed the door on his hand.  Wound is painful.  Required I&D on 12/20 with purulence noted with hand surgery, culture showed normal skin flora.  Required another I&D on 12/20 with dehiscence tissue.  ID was called recommended biopsy, AFB fungal and bacterial cultures. - Spoke with patient and he has had a new wound on his right finger.  He states that that is how his wound starts.  Denies any prior history of wound starting in this manner. Recommendations:  - linezolid  + augmentin  -Await biopsy(reached out to path to see if we can expedite path read) , I think this likely inflammatory. Hold off steroids if stable till biopsy return.  -Acute hep panel , ANAnegative - Discussed plan with primary - Standard precautions   Microbiology:   Antibiotics: Linezolid  and augmentin  Cultures: Blood  Urine  Other   SUBJECTIVE: Resting in bed.  Interval: Afebrile overnight  Review of Systems: Review of Systems  All other systems reviewed and are negative.    Scheduled Meds:  amoxicillin -clavulanate  1 tablet Oral Q12H   diltiazem   240 mg Oral Daily   feeding supplement  237 mL Oral BID BM   fluticasone  furoate-vilanterol  1 puff  Inhalation Daily   furosemide   40 mg Oral Daily   hydrALAZINE   50 mg Oral Q8H   linezolid   600 mg Oral Q12H   metoprolol  succinate  100 mg Oral Daily   pantoprazole   40 mg Oral BID   rosuvastatin   20 mg Oral Daily   saccharomyces boulardii  250 mg Oral BID   Continuous Infusions: PRN Meds:.acetaminophen  **OR** acetaminophen , albuterol , ondansetron  **OR** ondansetron  (ZOFRAN ) IV Allergies[1]  OBJECTIVE: Vitals:   12/09/24 0530 12/09/24 0813 12/09/24 0926 12/09/24 1335  BP: 94/62 91/70  100/71  Pulse:  (!) 106  85  Resp:  16    Temp:  97.7 F (36.5 C)    TempSrc:      SpO2:  90% 90%   Weight:      Height:       Body mass index is 29.37 kg/m.  Physical Exam Constitutional:      General: He is not in acute distress.    Appearance: He is normal weight. He is not toxic-appearing.  HENT:     Head: Normocephalic and atraumatic.     Right Ear: External ear normal.     Left Ear: External ear normal.     Nose: No congestion or rhinorrhea.     Mouth/Throat:     Mouth: Mucous membranes are moist.     Pharynx: Oropharynx is clear.  Eyes:     Extraocular Movements: Extraocular movements intact.  Conjunctiva/sclera: Conjunctivae normal.     Pupils: Pupils are equal, round, and reactive to light.  Cardiovascular:     Rate and Rhythm: Normal rate and regular rhythm.     Heart sounds: No murmur heard.    No friction rub. No gallop.  Pulmonary:     Effort: Pulmonary effort is normal.     Breath sounds: Normal breath sounds.  Abdominal:     General: Abdomen is flat. Bowel sounds are normal.     Palpations: Abdomen is soft.  Musculoskeletal:        General: No swelling.     Cervical back: Normal range of motion and neck supple.     Comments: Left hand bandaged  Skin:    General: Skin is warm and dry.  Neurological:     General: No focal deficit present.     Mental Status: He is oriented to person, place, and time.  Psychiatric:        Mood and Affect: Mood normal.        Lab Results Lab Results  Component Value Date   WBC 9.9 12/08/2024   HGB 12.8 (L) 12/08/2024   HCT 40.5 12/08/2024   MCV 95.7 12/08/2024   PLT 287 12/08/2024    Lab Results  Component Value Date   CREATININE 1.00 12/08/2024   BUN 25 (H) 12/08/2024   NA 137 12/08/2024   K 3.7 12/08/2024   CL 104 12/08/2024   CO2 24 12/08/2024    Lab Results  Component Value Date   ALT 57 (H) 05/07/2024   AST 37 (H) 05/07/2024   ALKPHOS 41 04/17/2024   BILITOT 0.6 05/07/2024        Loney Stank, MD [facility] 12/09/2024, 2:41 PM Evaluation of this patient requires complex antimicrobial therapy evaluation and counseling + isolation needs for disease transmission risk assessment and mitigation      [1]  Allergies Allergen Reactions   Dust Mite Extract Other (See Comments)    Asthma related   "

## 2024-12-09 NOTE — Progress Notes (Signed)
" °   12/09/24 1113  Spiritual Encounters  Type of Visit Initial  Care provided to: Patient  Referral source Nurse (RN/NT/LPN)  Reason for visit Advance directives  OnCall Visit No  Spiritual Framework  Presenting Themes Impactful experiences and emotions  Community/Connection Family  Patient Stress Factors Exhausted;Health changes  Family Stress Factors None identified  Interventions  Spiritual Care Interventions Made Established relationship of care and support;Normalization of emotions;Prayer  Intervention Outcomes  Outcomes Awareness of support;Awareness of health   Chaplain met with the Pt to discuss AD. The Pt stated that he believes he has already completed an AD. Chaplain acknowledge the Pt's statement.  The Pt then shared that he was hospitalized due to an injury to his and and requested a word of prayer, chaplain offered  prayer. The Pt expressed gratitude for the visit.  Chaplain services remain available for future spiritual and emotional support as needed. "

## 2024-12-09 NOTE — Progress Notes (Signed)
 " PROGRESS NOTE    Mario [name]  FMW:[id number] DOB: 02/01/1946 DOA: 12/07/2024 PCP: [name] DASEN, MD   Brief Narrative:  This 78 y.o. male with PMH significant for hypertension, hyperlipidemia, persistent atrial fibrillation, bronchiectasis, and OSA no longer on CPAP who presents with Left hand swelling. He has been experiencing hand swelling for the past couple of weeks without recalling any specific inciting event such as an insect bite. A similar episode occurred earlier this year, which resolved after surgical intervention. He is on Eliquis  for atrial fibrillation and has not taken his blood pressure medication this morning. He denies the use of CPAP at night and has no history of diabetes. He has been on doxycycline  for the last week.  Patient was admitted under orthopedics.   Assessment & Plan:   Principal Problem:   Swelling of left hand   Left hand swelling: Patient presented with left hand swelling for 2 weeks.   He had been on doxycycline  over the last week without improvement in symptoms.   Patient was taken to the operating room for debridement which did not reveal any purulent drainage and biopsy was done.   Cultures were sent, but prior cultures did not show any growth.  Possibility of infectious process versus inflammatory. ESR (44) and CRP (12.4) Continue empiric antibiotics (linezolid  and Augmentin ) as recommended by ID Wound care per Hand surgery.   Leukocytosis: Leukocytosis likely secondary to above. Resolved.   Paroxysmal atrial fibrillation: Noted to be in atrial fibrillation currently.  Patient took Eliquis  2 days ago. Continue metoprolol  100 mg daily. Continue Cardizem  CD 240 mg daily. Continue to hold Eliquis  due to open wound.  Resume when deemed medically appropriate.   COPD: Patient without significant wheezing or rhonchi appreciated at this time. Continue breathing treatments as needed.   DVT prophylaxis: SCDs Code Status: Full  code Family Communication: No family at bed side. Disposition Plan:     Status is: Inpatient Remains inpatient appropriate because: Patient admitted for left hand infection,  underwent I&D.  Started on empiric antibiotics,  pending  cultures.  Patient not medically ready for discharge.    Consultants:  Hand Surgery  Procedures: I & D  Antimicrobials:  Anti-infectives (From admission, onward)    Start     Dose/Rate Route Frequency Ordered Stop   12/07/24 1545  linezolid  (ZYVOX ) tablet 600 mg        600 mg Oral Every 12 hours 12/07/24 1456     12/07/24 1545  amoxicillin -clavulanate (AUGMENTIN ) 875-125 MG per tablet 1 tablet        1 tablet Oral Every 12 hours 12/07/24 1456         Subjective: Patient was seen and examined at bedside. Overnight events noted. Patient reports feeling much improved, status post I&D.   Left hand in dressing.  Reports pain is controlled  Objective: Vitals:   12/09/24 0520 12/09/24 0530 12/09/24 0813 12/09/24 0926  BP: 94/62 94/62 91/70    Pulse: 92  (!) 106   Resp: 18  16   Temp: (!) 97.3 F (36.3 C)  97.7 F (36.5 C)   TempSrc: Oral     SpO2: 92%  90% 90%  Weight:      Height:       No intake or output data in the 24 hours ending 12/09/24 1314  Filed Weights   12/07/24 0708  Weight: 88.9 kg    Examination:  General exam: Appears calm and comfortable, not in any acute distress.  Respiratory system: CTA Bilaterally. Respiratory effort normal. RR 14 Cardiovascular system: S1 & S2 heard, RRR. No JVD, murmurs, rubs, gallops or clicks.  Gastrointestinal system: Abdomen is non distended, soft and non tender.  Normal bowel sounds heard. Central nervous system: Alert and oriented x 3. No focal neurological deficits. Extremities: No edema, no cyanosis, no clubbing.  Left hand in dressing. Skin: No rashes, lesions or ulcers Psychiatry: Judgement and insight appear normal. Mood & affect appropriate.    Data Reviewed: I have personally  reviewed following labs and imaging studies  CBC: Recent Labs  Lab 12/07/24 0749 12/07/24 1158 12/08/24 0529  WBC  --  11.8* 9.9  NEUTROABS  --  9.5*  --   HGB 17.0 14.6 12.8*  HCT 50.0 45.1 40.5  MCV  --  95.1 95.7  PLT  --  346 287   Basic Metabolic Panel: Recent Labs  Lab 12/07/24 0749 12/08/24 0529  NA 140 137  K 3.7 3.7  CL 103 104  CO2  --  24  GLUCOSE 104* 89  BUN 28* 25*  CREATININE 1.00 1.00  CALCIUM   --  8.4*   GFR: Estimated Creatinine Clearance: 65.5 mL/min (by C-G formula based on SCr of 1 mg/dL). Liver Function Tests: No results for input(s): AST, ALT, ALKPHOS, BILITOT, PROT, ALBUMIN  in the last 168 hours. No results for input(s): LIPASE, AMYLASE in the last 168 hours. No results for input(s): AMMONIA in the last 168 hours. Coagulation Profile: No results for input(s): INR, PROTIME in the last 168 hours. Cardiac Enzymes: No results for input(s): CKTOTAL, CKMB, CKMBINDEX, TROPONINI in the last 168 hours. BNP (last 3 results) No results for input(s): PROBNP in the last 8760 hours. HbA1C: No results for input(s): HGBA1C in the last 72 hours. CBG: No results for input(s): GLUCAP in the last 168 hours. Lipid Profile: No results for input(s): CHOL, HDL, LDLCALC, TRIG, CHOLHDL, LDLDIRECT in the last 72 hours. Thyroid  Function Tests: No results for input(s): TSH, T4TOTAL, FREET4, T3FREE, THYROIDAB in the last 72 hours. Anemia Panel: No results for input(s): VITAMINB12, FOLATE, FERRITIN, TIBC, IRON , RETICCTPCT in the last 72 hours. Sepsis Labs: No results for input(s): PROCALCITON, LATICACIDVEN in the last 168 hours.  Recent Results (from the past 240 hours)  Aerobic/Anaerobic Culture w Gram Stain (surgical/deep wound)     Status: None   Collection Time: 11/29/24  6:35 PM   Specimen: Path fluid; Body Fluid  Result Value Ref Range Status   Specimen Description FLUID  Final    Special Requests DEEP ABSCESS DRAINAGE LEFT HAND  Final   Gram Stain   Final    RARE WBC PRESENT, PREDOMINANTLY PMN NO ORGANISMS SEEN    Culture   Final    RARE NORMAL SKIN FLORA NO ANAEROBES ISOLATED Performed at [facility], [address] [place], [place] [address]    Report Status 12/04/2024 FINAL  Final  Aerobic/Anaerobic Culture w Gram Stain (surgical/deep wound)     Status: None (Preliminary result)   Collection Time: 12/07/24 10:32 AM   Specimen: Wound; Tissue  Result Value Ref Range Status   Specimen Description WOUND  Final   Special Requests DEEP ABSCESS HAND  Final   Gram Stain   Final    RARE WBC PRESENT, PREDOMINANTLY PMN NO ORGANISMS SEEN    Culture   Final    NO GROWTH 2 DAYS NO ANAEROBES ISOLATED; CULTURE IN PROGRESS FOR 5 DAYS Performed at [facility], [address], [place],  [place] [address]    Report Status PENDING  Incomplete  Aerobic/Anaerobic Culture w Gram Stain (surgical/deep wound)     Status: None (Preliminary result)   Collection Time: 12/07/24 10:32 AM   Specimen: Wound; Tissue  Result Value Ref Range Status   Specimen Description TISSUE  Final   Special Requests HAND WOUND  Final   Gram Stain   Final    RARE WBC PRESENT, PREDOMINANTLY PMN NO ORGANISMS SEEN Performed at [facility], [address] [place], [place] [address]    Culture   Final    CULTURE REINCUBATED FOR BETTER GROWTH NO ANAEROBES ISOLATED; CULTURE IN PROGRESS FOR 5 DAYS    Report Status PENDING  Incomplete  Aerobic/Anaerobic Culture w Gram Stain (surgical/deep wound)     Status: None (Preliminary result)   Collection Time: 12/07/24 10:39 AM   Specimen: Wound  Result Value Ref Range Status   Specimen Description TISSUE  Final   Special Requests DEEP ABSCESS HAND  Final   Gram Stain   Final    FEW WBC PRESENT, PREDOMINANTLY PMN NO ORGANISMS SEEN    Culture   Final    NO GROWTH 2 DAYS NO ANAEROBES ISOLATED; CULTURE IN PROGRESS FOR 5 DAYS Performed  at [facility], [address] [place], [place] [address]    Report Status PENDING  Incomplete   Radiology Studies: No results found.  Scheduled Meds:  amoxicillin -clavulanate  1 tablet Oral Q12H   diltiazem   240 mg Oral Daily   feeding supplement  237 mL Oral BID BM   fluticasone  furoate-vilanterol  1 puff Inhalation Daily   furosemide   40 mg Oral Daily   hydrALAZINE   50 mg Oral Q8H   linezolid   600 mg Oral Q12H   metoprolol  succinate  100 mg Oral Daily   pantoprazole   40 mg Oral BID   rosuvastatin   20 mg Oral Daily   saccharomyces boulardii  250 mg Oral BID   Continuous Infusions:   LOS: 2 days    Time spent: 35 Mins    Darcel Dawley, MD [facility]   If 7PM-7AM, please contact night-coverage  "

## 2024-12-10 ENCOUNTER — Encounter (HOSPITAL_COMMUNITY)

## 2024-12-10 DIAGNOSIS — M7989 Other specified soft tissue disorders: Secondary | ICD-10-CM | POA: Diagnosis not present

## 2024-12-10 LAB — FUNGUS STAIN

## 2024-12-10 LAB — ACID FAST SMEAR (AFB, MYCOBACTERIA)
Acid Fast Smear: NEGATIVE
Acid Fast Smear: NEGATIVE

## 2024-12-10 LAB — ACID FAST CULTURE WITH REFLEXED SENSITIVITIES (MYCOBACTERIA)

## 2024-12-10 LAB — SURGICAL PATHOLOGY

## 2024-12-10 MED ORDER — APIXABAN 5 MG PO TABS
5.0000 mg | ORAL_TABLET | Freq: Two times a day (BID) | ORAL | Status: DC
  Administered 2024-12-10 – 2024-12-11 (×2): 5 mg via ORAL
  Filled 2024-12-10 (×2): qty 1

## 2024-12-10 NOTE — Discharge Instructions (Addendum)
 [employer] assistance programs Crisis assistance programs  -Partners Ending Homelessness Arts Development Officer. If you are experiencing homelessness in [place], [place] , your first point of contact should be Pensions Consultant. You can reach Coordinated Entry by calling ([phone] or by emailing [email or web address].  Community access points: [employer] ([phone] [address], [place]) every Tuesday from 9am-10am. [facility] ([address] [place]) every Wednesday from 8am-9am.   -Novelty Coordinated Re-entry Daniel Mcalpine: Dial 211 and request. Offers referrals to homeless shelters in the area.    -The [employer] (507) 234-1096) offers several services to local families, as funding allows. The Emergency Assistance Program (EAP), which they administer, provides household goods, free food, clothing, and financial aid to people in need in the [place]  area. The EAP program does have some qualification, and counselors will interview clients for financial assistance by written referral only. Referrals need to be made by the Department of Social Services or by other EAP approved human services agencies or charities in the area.  -Open Door Ministries of [employer], which can be reached at (209)407-4720, offers emergency assistance programs for those in need of help, such as food, rent assistance, a soup kitchen, shelter, and clothing. They are based in [facility] [place]  but provide a number of services to those that qualify for assistance.   [facility] Department of Social Services may be able to offer temporary financial assistance and cash grants for paying rent and utilities, Help may be provided for local county residents who may be experiencing personal crisis when other resources, including government programs, are not available. Call ([phone]  -High [employer] is a [employer] agency, The organization can offer emergency assistance for paying rent, caremark rx, utilities, food, household products and furniture. They offer extensive emergency and transitional housing for families, children and single women, and also run a Boy's and Dole [employer]. Thrift Shops, Secondary School Teacher, and other aid offered too. [address], [place], [place]  [address], [phone]  -[place] Low Income Energy Assistance Program -- This is offered for [facility] families. The federal government created [employer] Program provides a one-time cash grant payment to help eligible low-income families pay their electric and heating bills. [address], [place], [place]  [address], [phone]  -[place] Emergency Assistance -- A program offers emergency utility and rent funds for greater [employer] area residents. The program can also provide counseling and referrals to charities and government programs. Also provides food and a free meal program that serves lunch Mondays - Saturdays and dinner seven days per week to individuals in the community. [address], [place], [place]  [address], [phone]  -[employer] - Offers affordable apartment and housing communities across      [place] and [place]. The low income and seniors can access public housing, rental assistance to qualified applicants, and apply for the section 8 rent subsidy program. Other programs include Chiropractor and Engineer, Maintenance. [address], [place], [place]  [address], dial [phone].  -The [facility] provides transitional housing to veterans and the disabled. Clients will also access other services too, including assistance in applying for Disability, life skills classes, case management, and assistance in finding permanent housing. [address], [place], [place]  [place] [address], call [phone]  -[place] Transitional Housing through [facility] is for people who were just  evicted or that are formerly homeless. The non-profit will also help then gain self-sufficiency, find a home or apartment to live in, and also provides information on rent assistance when needed. Phone [phone]  -The [facility] helps low income, elderly, or disabled residents in seven counties in the [place] ([place], [place], [place], [place], [place], [place], [place], and [place]) save energy and reduce their utility bills by improving energy efficiency. Phone [phone].  -[employer] is located in the [place] [facility] in the [employer], [address], [place], [place] [address]. Parking is in the rear of the building. Phone: [phone]   General Email: info@gsohc .org  GHC provides free housing counseling assistance in locating affordable rental housing or housing with support services for families and individuals in crisis and the chronically homeless. We provide potential resources for other housing needs like utilities. Our trained counselors also work with clients on budgeting and financial literacy in effort to empower them to take control of their financial situations. [employer] collaborates with homeless service providers and other stakeholders as part of the [employer] COC (Continuum of Care). The (COC) is a regional/local planning body that coordinates housing and services funding for homeless families and individuals. The role of [facility] in the COC is through housing counseling to work with people we serve on diversion strategies for those that are at imminent risk of becoming homeless. We also work with the Coordinated Assessment/Entry Specialist who attempts to find temporary solutions and/or connects the people  to Housing First, Rapid Re-housing or transitional housing programs. Our Homelessness Prevention Housing Counselors meet with clients on business days (Monday-Fridays, except scheduled holidays) from 8:30 am to 4:30 pm.  Legal assistance for evictions, foreclosure, and more -If you need free legal advice on civil issues, such as foreclosures, evictions, electronics engineer, government programs, domestic issues and more, Armed Forces Operational Officer Aid of [place]  [facility]) is a associate professor firm that provides free legal services and counsel to lower income people, seniors, disabled, and others, The goal is to ensure everyone has access to justice and fair representation. Call them at ([phone].  [facility] for Housing and Community Studies can provide info about obtaining legal assistance with evictions. Phone [phone].  Data Processing Manager  The [employer], [employer]. offers job and dispensing optician. Resources are focused on helping students obtain the skills and experiences that are necessary to compete in today's challenging and tight job market. The non-profit faith-based community action agency offers internship trainings as well as classroom instruction. Classes are tailored to meet the needs of people in the [facility] region. [place], [place] [address], [phone]  Foreclosure prevention/Debt Services Family Services of the [employer] Credit Counseling Service inludes debt and foreclosure prevention programs for local families. This includes money management, financial advice, budget review and development of a written action plan with a pensions consultant to help solve specific individual financial problems. In addition, housing and mortgage counselors can also provide pre- and post-purchase homeownership counseling, default resolution counseling (to prevent foreclosure) and reverse mortgage counseling. A Debt Management Program allows  people and families with a high level of credit card or medical debt to consolidate and repay consumer debt and loans to creditors and rebuild positive credit ratings and scores. Contact ([phone] F1555895.  Community clinics in [place] -Health Department [facility]: [address], [place], [address]. [phone].  -Health Department [place] [facility]: [phone]  [address], [facility], [address]. [phone].  -[facility] offers medical care through a group of doctors, pharmacies and other healthcare related agencies that offer services for low income, uninsured adults in [place]. Also offers adult Dental care and assistance with applying for an [employer]. Call [phone].   [facility]. This center provides low-cost health care to those without health insurance. Services offered include an onsite pharmacy. Phone [phone]. [address], [address], [place].  -Medication Assistance Program serves as a link between pharmaceutical companies and patients to provide low cost or free prescription medications. This service is available for residents who meet certain income restrictions and have no insurance coverage. PLEASE CALL [phone] [place]) OR [phone] ([place])  -One Step Further: Materials Engineer, The [employer] Support & Nutrition Program, [place]. Call 5345025306/ [phone].  Food pantry and assistance -Urban Ministry-Food Bank: [address], [place] [address]. Phone [phone]  -Blessed Table Food Pantry: [address], [place], [place] [address]. [phone].  -Missionary Ministry: has the purpose of visiting the sick and shut-ins and provide for needs in the surrounding communities. Call [phone]. Email: [email or web address] This program provides: Food box for seniors, Financial assistance, Food to meet basic  nutritional needs.  -Meals on Wheels with Senior Resources: [facility] residents age 55 and over who are homebound and unable to obtain and prepare a nutritious meal for themselves are eligible for this service. There may be a waiting list in certain parts of [facility] if the route in that area is full. If you are in [facility] and [place] call [phone] to register. For all other areas call [phone] to register.  -Greater Dietitian: https://findfood.bargaincontractor.si  TRANSPORTATION: -[employer] Department of Health: Call [facility] and [employer] at ([phone] for details. attractionguides.es  -Access [place]: Access [place] is the [employer] Agency's shared-ride transportation service for eligible riders who have a disability that prevents them from riding the fixed route bus. Call ([phone]. Access [place] riders must pay a fare of $1.50 per trip, or may purchase a 10-ride punch card for $14.00 ($1.40 per ride) or a 40-ride punch card for $48.00 ($1.20 per ride).  -The Directv transportation service is provided for senior citizens (60+) who live independently within [place] city limits and are unable to drive or have limited access to transportation. Call [phone] to schedule an appointment.  -[place] Transportation: For Medicare or Medicaid recipients call [phone]?[facility] Ambulance, wheelchair fleeta, and ambulatory quotes available.   FLEEING VIOLENCE: -Family Services of the [place]- 24/7 Crisis line (867)138-1976) -[facility] Justice Centers: (336) [phone]-[phone])  [place] 2-1-1 is another useful way to locate resources in the community. Visit shedsizes.ch to find service information online. If you need additional assistance, 2-1-1 Referral Specialists are available 24 hours a day, every day by dialing  2-1-1 or [phone] from any phone. The call is free, confidential, and available in any language.  Affordable Housing Search http://www.nchousingsearch.[facility])   M-F 8a-3p [address] [place]  [place], [place] [address] [phone] Services include: laundry, barbering, support groups, case management, phone & computer access, showers, AA/NA mtgs, mental health/substance abuse nurse, job skills class, disability information, VA assistance, spiritual classes, etc. Winter Shelter available when temperatures are less than 32 degrees.   HOMELESS SHELTERS [facility] at [facility]- Call [phone] ext. [phone]  or ext. [phone]. Located at [address], [place], [place] [address]  [facility] Mens Shelter- Call [phone]. Located at [address], [place] [address].  [facility]- [employer]. Call [phone]. Office located at [address], [place] [address].  Pathways Family Housing through [place] [phone].  [facility] Family Shelter- Call [phone]. Located at [address] [place], [place], [place] [address].  Room at the Inn-For Pregnant mothers. Call [phone]. Located at [address] [place], [address].  [place] [facility]-For men in [place]. Call [phone]. [facility]-Shelter in [place]. Call [phone].  Home of [employer] for [employer] [phone]. Office located at [address] [place], [address].  [employer] be agreeable to help with chores. Call [phone] ext. [phone].  Men's: [address] [place], [place] [address]. Women's: [facility]  [address] [place], [place] [address]  Crisis Services Therapeutic Alternatives Mobile Crisis Management- [phone]  [facility] [address], [place], [place] [address]. Phone: [phone] Rent/Utility Assistance in  [facility]:  INNOVATIVE PATHWAYS [address], [place], [place] [address] [phone] Mon 8:00am - 6:00pm; Tue 8:00am - 6:00pm; Wed 8:00am - 6:00pm; Thu 8:00am - 6:00pm; Fri 8:00am - 6:00pm; Email: [email or web address] Eligibility: Residents of [place], [place], [place], [place], [place] and [place] that meet income limits. Call or text for eligibility screening.   [facility] MINISTRY [address] [place], [place], [place] [address] [phone] (Main: Rental Assistance) [phone] (Main: Utility Assistance) Mon 8:30am - 5:00pm; Tue 8:30am - 5:00pm; Wed 8:30am - 5:00pm; Thu 8:30am - 5:00pm; Fri 8:30am - 5:00pm; Website: http://www.greensborourbanministry.org/emergency-assistance-program Eligibility: People who have an unexpected crisis or emergency that can be verified. Must have some form of income and meet income limits. At the first of the month, only helps with rent/mortgage assistance for those who have court ordered eviction notices. Call for application information. Call for exact documents that will be needed. Examples of documents that may be needed: Photo ID, Social Security cards for everyone in the household, and proof of income for previous 2 months. Copy of eviction notice for rent assistance and copy of final notice for utility assistance. Statements or receipts of bills for previous 2 months.   [facility] - [place] [address], [place], [place] [address] [phone] (Main) [phone] (Alternate) Mon 9:00am - 5:00pm; Tue 9:00am - 5:00pm; Wed 9:00am - 5:00pm; Thu 9:00am - 5:00pm; Fri 9:00am - 5:00pm; Website: http://[email or web address].salvationarmy.org/[place]/emergency-financial-assistance Email: [email or web address] Eligibility: People experiencing a housing crisis with past-due rent and/or utilities and meet income limits. Must be willing to take part in 6 Call or visit website to download  application. Return complete application by mail or email only. Documents: Help with Utilities: Photo ID, proof of household income, copies of monthly bills or receipts, and a final disconnection/shut-off notice. Help with Rent or Mortgage: Photo ID, proof of income, copies of monthly bills or receipts, and eviction notice. Help with Household Goods: Photo ID, proof of household income, copies of monthly bills or receipts, and a fire or flood report.  [facility] - [place] [address] [place], [place] [address] [phone] (Main) Mon 8:00am - 5:00pm; Tue 8:00am - 5:00pm; Wed 8:00am - 5:00pm; Thu 8:00am - 5:00pm; Fri 8:00am - 12:00pm; Website: http://southernusa.salvationarmy.org/[place]/emergency-financial-assistance Email: [email or web address] Call for eligibility information. Apply :Utilities Assistance: Visit office by 8:30am on 1st and 4th Monday of each  month to pick up application. Rent and Mortgage Assistance: Visit office by 8:30am on 2nd and 3rd Monday of each month to pick up application. NOTE: If Monday falls on a holiday applications can be picked up the following Tuesday. Documents required will be listed on application.  [facility] [place] [phone] (Main) Seen by appointment only. Call for more information. Eligibility: Meet income limits. Apply: Call for information on how to schedule an appointment. Each month there is a specific day to call to schedule an appointment. It is stated on the agency voicemail message. Appointments fill up quickly each month. Documents: Photo ID, copy of current utility bill.  [facility] HANDS [place] [address], [place], [place] [address] [phone] (Main) Tue 9:00am - 4:00pm; Wed 9:00am - 4:00pm; Thu 9:00am - 4:00pm; Website: http://www.[email or web address] Email: help[email or web address] [email or web address] Eligibility: Utility Assistance: Meet income limits and be a Haematologist. [employer] customers do not qualify. Must not have received utility assistance for another agency within the last 90 days. Rent Assistance: Residents of [place] who meet income limits. Must not have received rent assistance for another agency within the last 90 days. Apply: Call to schedule an appointment. Documents: Utility Assistance: Photo ID, City of [employer], copy of lease (if not paying a mortgage), proof of income, and monthly expenses. Rent Assistance: Photo ID, W-9 from the landlord, copy of the lease, proof of income, and a list of monthly expenses.  [facility]OR MINISTRIES - [place] [address] [place], [place] [address] [phone] (Main: Help With Rent) [phone] (Main: Help With Utilities) Mon 9:00am - 4:00pm; Tue 9:00am - 4:00pm; Wed 9:00am - 4:00pm; Thu 9:00am - 4:00pm; Fri 9:00am - 4:00pm; Website: [email or web address].no Email: [email or web address] Eligibility: People experiencing a financial crisis. Apply: Call to schedule an appointment Wednesday, 7:30am. Documents: Photo ID, Social Security card, proof of income, and proof of address. Other documents may be required, depending on service. Call for more information.  LOW INCOME ENERGY ASSISTANCE PROGRAM DEPARTMENT OF SOCIAL SERVICES - [facility] [address], [place], [place] [address] [phone] (Main) Mon 8:00am - 5:00pm; Tue 8:00am - 5:00pm; Wed 8:00am - 5:00pm; Thu 8:00am - 5:00pm; Fri 8:00am - 5:00pm; Website: [email or web address] Eligibility: Meet income limits and resource guidelines. Each household is only eligible once, even if multiple members apply. Apply: Call to see if funds are available. Visit to complete an application, call to have 1 mailed, or apply online at [email or web address] NOTE: Households with a person age 19  and over or a person with a documented disability can apply beginning December 1. Other households can apply beginning January 1. Documents: Photo ID, birth certificate, proof of household income, copy of utility bill, latest bank statement, the names and Social Security numbers for everyone in the household, and proof of disability if under age 67.  LOW INCOME ENERGY ASSISTANCE PROGRAM DEPARTMENT OF SOCIAL SERVICES - [facility] [address] [employer], [place], [place] [address] [phone] (Main) Mon 8:00am - 5:00pm; Tue 8:00am - 5:00pm; Wed 8:00am - 5:00pm; Thu 8:00am - 5:00pm; Fri 8:00am - 5:00pm; Website: [email or web address] Eligibility: Meet income limits and resource guidelines. Each household is only eligible once, even if multiple members apply. Apply: Call to see if funds are available. Visit to complete an application, call to have 1 mailed, or apply online at [email or web address] NOTE: Households with a person age 35 and over or a person with a documented  disability can apply beginning December 1. Other households can apply beginning January 1. Documents: Photo ID, birth certificate, proof of household income, copy of utility bill, latest bank statement, the names and Social Security numbers for everyone in the household, and proof of disability if under age 5. [place]  [place] [facility]ISTRY Address: [address] [place], [place] [address] Phone Number: [phone] Hours of Operation: Residents of [place] can come to obtain food Monday through Friday from 8:30am until 3:30pm. Photo ID and Social Security cards required for all residents of a household. Can come six times a year  THE BLESSED TABLE Address: [address] [place], [place] [address] Phone Number: [phone] Hours of Operation: Operates Tuesday-Friday 10:00 a.m. to 1 p.m. Requirements: Referral from DSS needed. May come 6 times a year,  30 days apart. Photo ID and SS required for all residents of household.  [facility] MINISTRIES Address: [address] [place], [place] [address] Phone Number: [phone] Hours of Operation: Food pantry is open on the last Saturday of each month from 10:00 am - 12:00 noon. No appointment needed. No qualifications.  [facility] Address: [address] [place], [place] [address] Phone Number: [phone] EXT. [phone] Hours of Operation: Must make reservations to pick up food on Saturdays. Sign ups for Saturday pick up beginning at 8:30 a.m. on Monday morning.  [facility] Address: [address] [place], [place] [address] Phone Number: [phone] Hours of Operation: If you need food, bring proper identification such as a drivers license to receive a bag of food once a month. Requirements: Can come once every 30 days with referral DSS, Holiday Representative, Mental health etc. Each referral good for six visits. Photo ID required. *1st visit no referral required.  [facility] Address: [address] [place], [place] [address] Phone Number: [phone]  [place] [facility] Address: [address] [place], [place] [address] Phone Number: [phone] Hours of Operation:  You can register at [email or web address] for free groceries  FREE IN[facility]ED FOOD PANTRY Address: [address] [place], [place] [address] Phone Number: [phone] Hours of Operation: Drive through giveaway, first come first served. Every 3rd Saturday 11AM - 1PM  [facility] [address] Address: [address] [place] [address] Phone Number: [phone]   [place]  HAND TO HAND FOOD PANTRY Address: [address] [facility] [address] [place], [place] [address] Phone Number: [phone] Hours of Operation: Once a month every 3rd Saturday  [facility] Address: [address] [place], [place] [address] Phone Number: [phone] Hours of  Operation: Distribution happens from 9:00-10:00 a.m. every Saturday.     HELP[facility]S Address: [address] [facility] [address] [place], [place] [address] Phone Number: [phone] Hours of Operation: ONCE a week for the community food distribution held every Tuesday, Wednesday and Thursday from 11 a.m. - 2:00 p.m. Food is available on a first come, first serve basis and varies week to week. No appointment necessary for drive thru pick up.  [facility] Address: [address] [place], [place] [address] Phone Number: [phone] Hours of Operation: Open every 3rd Thursday 9:30 a.m. - 11:00 a.m.  [facility] Address: [address] [place], [place] [address] Phone Number: [phone] Hours of Operation: Please call for hours, directions, and questions  [facility] [place] [facility] Address: [address], [place], [place]  [address] Phone Number: [phone] Website: https://www.hollyguns.co.za Food Finder app: https://findfood.ghpfa.org  [employer] Address: [address] [place], [place] [address] Phone Number: [phone] Hours of Operation: Contact Bree Harpe. Enrolled Substance Abuse Clients Only  [facility] Address: [address] [place], [address]  Phone Number: [phone] Hours of Operation: Contact Bartley Irving. Food pantry open the 3rd Saturday of each month from 9 a.m. -12 p.m. only  [place] [facility] Address: [address] [place], [place] [address] Phone Number: [phone] Hours of Operation: Contact Geni Lee. Emergency food bank open on Saturdays by appointment only  [facility] Address: [address] [place], [place] [address] Phone Number: [phone] Hours of Operation: No specific contact person; Anyone can help  [facility] Address: [address] [place], [place] [address] Phone Number: [phone] Hours of Operation: Contact Medford Molt. Agency  gives out a bag of food every Thursday from 2-4 p.m. only, and also provides a community meal every Thursday between 5-6 p.m. Other services provided include rent/mortgage and utility assistance, women's winter shelter, thrift store, and senior adult activities.  [facility] [place] Address: [address] [place], [place] [address] Phone Number: [phone] Hours of Operation: The Emergency Food Assistance Program provides individuals and families with a generous supply of food including meat, fresh vegetables, and nonperishable items. The food box contains five days worth of food, and each family or individual can receive a box once per month. M, W, Th, Fr 11am-2pm, walk-ins welcome.  [facility] Address: [address] [place], [place] [address]  Phone Number: [phone] Hours of Operation: Contact Asia Cathlean. Tuesdays and Thursdays from 11am - 3pm by appointment only  Sunday, by APPOINTMENT ONLY  [address] of [place], [address] [place], [address], [phone], 3.2 mi from [facility], call in advance for appointment at 10:00am or at 4:00pm, must provide valid photo ID  Monday  9:30am-5:00pm [facility], [address] [place] [phone], [phone], 0.9 mi from [facility], can come four times per year, bring your photo ID and SS cards for other residents of household, will make appointments for those who work and need to come after 5pm  10:00am-12:00noon [employer], [address]  [place], [address], [phone], 1.7 mi from [facility], can come once every 60 days per household, need referral from DSS, [employer], etc., bring photo ID and SS card   10:00am-1:00pm [facility] the [facility],  [address] [place], [address], [phone], 7.7 mi from [facility], can come once every thirty days with a referral from DSS, [employer], Mental  Health, etc. -- each referral good for six visits, bring photo ID   10:00am-1:00pm [address], [address] [address], [phone], 4.2 mi from [facility], can come once every 6 months, open to [facility] residents, bring photo ID and copy of a current utility bill in your name, please call first to verify that food is available  6:30pm-8:30pm [facility], [address], [address], [phone], 3.2 mi from [facility], can come once every 30 days, maximum 6 times per year, bring your photo ID and SS numbers for other residents of household  Monday by APPOINTMENT ONLY  [facility], [address], [address],  [phone], 2.5 mi from [facility], call in advance for appointment between 10:00am-2:00pm, bring your photo ID and SS  cards for all residents of household, can come once every 3 months  [facility], [address], [address], [phone], 0.7 mi from [facility], call in advance for appointment, can come once every 30 days, bring your photo ID and SS cards for other residents of household   Tuesday  9:00am-12:00noon [employer], [address] [address], [phone], 1.3 mi from [facility], can come once every 3 months, bring your photo ID and SS numbers for other residents of household   9:00am-1:00pm [facility] [address]  [place], [address], [phone]/Ext [phone], 1.6 mi from [facility], can come once every two weeks  9:30am-5:00pm [employer], [address] [place] [phone], [phone], 0.9 mi from [facility], can come four times per year, bring your photo ID and SS cards for other residents of household, will make appointments for those who work and need to come after 5pm  10:00am-12:00noon [employer], [address]  [place], [address], [phone], 1.7 mi from [facility], can come once every 60 days per household, need referral  from DSS, [employer], etc., bring photo ID and SS card  10:00am-1:00pm [facility], [id number],  [phone], 3.8 mi from [facility], with referral from DSS, may come six times, 30 days apart, bring your photo ID and SS cards for all residents of household   10:00am-1:00pm [address] the [facility], [address] [place], [address], [phone], 7.7 mi from [facility], can come once every thirty days with a referral from DSS, [employer], Mental Health, etc.- each referral good for six visits, bring photo ID   10:00am-1:00pm [address], [address], [address], [phone], 4.2 mi from [facility], can come once every 6 months, open to [facility] residents, bring photo ID and copy of a current utility bill in your name, please call first to verify that food is available  2:00pm-3:30pm [facility], [address], [phone], [phone], 3.7 mi from [facility], can come twelve times per year, one bag per family, bring photo ID   FIRST AND THIRD Tuesdays  10:00am-1:00pm, [address] [place], [address], [phone], 7.2 mi from [facility], can come once every 30 days   Tuesday, WHEN FOOD IS AVAILABLE (call)  12:00noon-2:00pm [facility], [address], [address], [phone], 1.5 mi from  [facility], can come once every 30 days, bring photo ID   Tuesday, by APPOINTMENT ONLY  [facility], [address], [address],  [phone], 2.5 mi from [facility], call in advance for appointment between 1:00pm-4:00pm, bring your photo ID and SS cards for all residents of household, can come once every 3 months   [address] of [address], [address] [address], [phone], 5 mi from [facility], call one day ahead for appointment the next day between 10:00am and  12:00noon, can come once every 3 months, bring your photo ID and must qualify according to family income   [facility] [employer] [facility], [address] [address], [phone], 0.7 mi from [facility], call in advance for appointment, can come once every 30 days, bring your photo ID and SS cards for other residents of household  [address], [address], [address],  [phone], 5.1 mi from [facility], call between 9:00am and 1:00pm M-F to make appointment. Appointments are scheduled for Tues and Thurs from 10:00am-11:30am, bring photo ID, can come once every 6 months, limit three visits over 18 months, then must have referral  Wednesday  9:30am-5:00pm [facility], [address] [place] [id number], [phone], 0.9 mi from [facility], can come four times per year, bring your photo ID and SS cards for other residents of household, will make appointments for those who work and need to come after 5pm  9:30am-11:30am [employer] [facility], [address], [address], [phone], 6.6 mi from [facility], can come once every 30 days, bring your photo ID, and SS cards for other residents of household, each monthly visit requires a written referral from GUM or DSS with number in household on form  10:00am-12:00noon [address]  [place]  [place], [address], [phone], 1.7 mi from [facility], can come once every 60 days per household, need referral from DSS, [employer], etc., bring photo ID and SS card  10:00am-1:00pm [facility], [id number],  [phone], 3.9 mi from [facility], with referral from DSS, may come six times, 30 days apart, bring your photo ID and SS cards for all residents of household   10:00am-1:00pm [address] the [facility], [address] [place], [address], [phone], 7.7 mi from [facility], can come once every thirty days with a  referral from DSS, [employer], Mental Health, etc. - each referral good for six visits, bring photo ID   2:00pm-5:45pm [address], [address] [place], [address], [phone], 3.2 mi from [facility], once every 30 days, first come/first served, limited to first 25, bring photo ID   6:30pm-8:30pm [facility], [address], [address], [phone], 3.2 mi from [facility], can come once every 30 days, maximum 6 times per year, bring your photo ID and SS numbers for other residents of household  FIRST and THIRD Wednesdays   9:00am-12:00noon, [facility], [address], [address], [phone], 4.2 mi from [facility], can come once a month. Please arrive and sign in no later than 11:15 so everyone can be served by 12 noon.  THIRD Wednesday  1:30pm-3:00pm, [place]. [address], [address] [place], [address], [phone] or [phone], 2.1 mi from [facility]  Wednesday, by APPOINTMENT ONLY  [address] [facility], [address] [address], [phone], 5 mi from [facility], call one day ahead for appointment the next day between 10:00am and 12:00noon, can come once every 3 months, bring your photo ID and must qualify according to family income   [facility], [address], [address], [phone], 0.7 mi from [facility], call in advance for appointment, can come once every 30 days, bring your photo ID and SS cards for other residents of household   [address] of [place], [address] [place], [address], [phone], 3.2 mi from [facility], call in advance for appointment at 7:00pm, must provide valid photo ID  Thursday  9:00am-12:00noon [employer], [address] [address], [phone], 1.3 mi from [facility], can come once every 3 months, bring your photo ID and SS  numbers for other residents of household   9:00am-1:00pm [facility] [address]  [place],  [address], [phone]/Ext 1, 1.6 mi from [facility], can come once every two weeks  9:30am-12:00noon [facility], [address], [address], [phone], 4.5 mi from [facility], can come once every 30 days, must state income [closed Thanksgiving and week of Christmas]  9:30am-5:00pm [employer], [address] [place] [phone], [phone], 0.9 mi from [facility], can come four times per year, bring your photo ID and SS card for other residents of household, will make appointments for those who work and need to come after 5pm  10:00am-1:00pm [employer], [address] [place], [address],  [phone], 3.9 mi from [facility], with referral from DSS, may come six times, 30 days apart, bring your photo ID and SS cards for all residents of household   10:00am-1:00pm [address] the [facility], [address] [place], [address], [phone], 7.7 mi from [facility], can come once every thirty days with a referral from DSS, [employer], Mental Health, etc.- each referral good for six visits, bring photo ID   10:00am-1:00pm [facility], [address] [address], [phone], 4.2 mi from [facility], can come once every 6 months, open to [facility] residents, bring photo ID and copy of a current utility bill in your name, please call first to verify that food is available  SUNDAYS BREAKFAST TWO LOCATIONS: 8:00am served in [facility] by Awaken Ppl Corporation 8:30am SHUTTLE provided from [facility], served at [employer], [address] LUNCH TWO LOCATIONS [plus one additional third Sunday only] 10:30am - 12:30pm served at [employer], [employer], [place] [address] (1.2 miles from [facility]) 12:30pm served in [place] by Land O'lakes Team (THIRD Sunday only) 1:30pm served at [facility] by [facility] one  location [plus one additional third Sunday only] 5:00pm Every Sunday, served under the bridge at [address] by Dovie Under the 3m Company (.7 miles from [facility]) (THIRD Sunday ONLY) 4:00pm served in the parking garage, across from Nuc[employer] Corporation, corner of [place] and [place] by Ryland Group Works Ministries MONDAYS BREAKFAST 7:30am served in [employer] by the United States [employer] and Friends LUNCH 10:30am - 12:30pm served at [employer], [employer], [place] [address] (1.2 miles from [facility]) DINNER TWO LOCATIONS: 7:00pm served in front of the courthouse at the corner of Goldman Sachs and National Oilwell Varco. by [place] Monday Night Meal (3 blocks from [facility]) 4:30pm served at the Autonation, [address] by Bank Of [place] Company (0.6 miles from [facility]) TUESDAYS BREAKFAST 8:00am - 9:00am served at The Tjx Companies, [address] (0.3 miles from [place]) LUNCH 10:30am - 12:30pm served at the [employer], [employer] [address], (1.2 miles from Bre[place]) DINNER 6:00pm served at Csx Corporation, enter from Capital One and go to the Sonic Automotive, (0.7 miles from [place]) [facility] BREAKFAST 7:00am - 8:00am served at [employer], [employer] [address] (1.2 miles from [place]) LUNCH ONE LOCATION [plus two additional locations listed below] 10:30am - 12:30pm served at [employer], [employer] [address] (  1.2 miles from [facility]) (FIRST Wednesday ONLY) 11:30am served at Dillard's, [place] [address] (6.6 miles from [place]) (SECOND Wednesday ONLY) 11:00am served at [place]. Garnette Ava Blackwood of [address], [address] (1.3 miles from [place]) [place] TWO LOCATIONS 6:00pm served at W. R. Berkley, [place] [address] Visteon Corporation.  (1.3 miles from [facility]) 4:00pm - 6:00pm (hot dogs and chips) served at Levi Strauss of [facility], [address] (1.7 miles from [place]) [place] BREAKFAST NOT AVAILABLE AT THIS TIME LUNCH 10:30am - 12:30pm served at [employer], [employer], [place] [address], (1.2 miles from [place]) DINNER 6:00pm served at Csx Corporation, enter from Capital One and go to the Sonic [employer]otive, (0.7 miles from Nucor Corporation) [place] BREAKFAST NOT AVAILABLE AT THIS TIME LUNCH 10:30am - 12:30pm served at [employer], [employer] [address] (1.2 miles from [place]) DINNER TWO LOCATIONS, [plus one additional first Friday only] 6:00pm served under the bridge at [address] by Dovie Under Csx Corporation. (.7 miles from [facility]) 5:00pm - 7:00pm served at Levi Strauss of [facility], [address] (1.7 miles from [place]) (FIRST Friday ONLY) 5:45 pm - SHUTTLE provided from the LIBRARY at 5:45pm. Served at [facility], [address]. SATURDAYS BREAKFAST TWO LOCATIONS [plus one additional last Saturday only] 8:00am served at [facility] by Delphi 8:30am served at [employer], [address] (2.2 miles from [facility]) (LAST Saturday ONLY) 8:30am served at Beazer Homes, [address] (5 miles from [place]) LUNCH 10:30am - 12:30pm served at [employer], [employer] [address] (1.2 miles from [facility]) DINNER 6:00pm served under the bridge at [address] by World Fuel Services Corporation (0.7 miles from Nucor Corporation)  DIRECTIONS FROM [place] TO ALL MEAL LOCATIONS The Bridge at [address] (.7 miles from [address]) [address] on [place]. Turn Right onto Directv 433  ft. Continue onto [address] under bridge, about 500 ft. Courthouse (3 blocks from [facility]) South on [address]. Turn right on [place]  1 block to Ppl Corporation (.5 miles from [place]) Moodus on [place]. Yrc Worldwide. past Brink's Company to Emcor. Enter from Capital One and go to the Affiliated Computer Services building W. R. Berkley [address] Visteon Corporation. (1.3 miles from [facility]) [address] on [place]. Turn Right onto [address]. [address]. church will be on the Left. The Tjx Companies [address] (.3 miles from [facility]) Go .3 miles on [address]. [address] Destination is on your right Dillard's at [place] (6.6 miles from [address]) [address] on [place] toward W Friend[address] Turn right onto W Friendly Continue onto Alcoa Inc. Continue onto Toll Brothers. 5. [place] is on right SANMINA-SCI Futures Trader) [address] (.6 miles from [place]) [place] on [place]. [address] Turn Left onto [address] 0.3 miles Destination is on the Left. [place] Black & Decker at American Express (5 miles from Nucor Corporation) 1. Head south on [address]. Turn right onto Hershey Company  Turn slightly left onto Becton, Dickinson And Company onto Quest Diagnostics Turn right at Barnes & Noble Continue to church on right [place] Sounds of [facility] [address] (1.7 miles from Enterprise) [address] on [place] 1.4 miles [place] becomes [place]. El[address] Continue 0.6 miles and church will be on theright. [place] Guardian Life Insurance at [address] (2.5 miles from Nucor Corporation) [place] provided from Massachusetts Mutual Life Park] [place] on [place]. Elm toward Estée Lauder right onto Costco Wholesale left onto Emerson Electric Turn left onto Micron Technology [address] [place]  [address] (2.2 miles from [address]) [address] on [place] 1.4 miles [place] becomes [place]. Elm  [address] Turn right onto [address] [place]  [address] and church will be on the Left. [facility]'s House/[place] At&t [address] (1.2 miles from [facility]) 1.Turn right onto [facility] 2.Turn left onto GEANNIE Beagle 3.Micheline MICAEL Ruth 4.Destination is on your right [place]. Garnette Ava Blackwood of [address] at Toysrus (1.3 miles from [facility]) [address] on [address] Turn left onto Genuine Parts right onto S. Maryellen Cassis. Continue onto Kb Home [place]. Turn left onto Smurfit-stone Container. Turn right onto Wellpoint.    OT WOUND CARE WITH DR. Laurieann Friddle STAFF ON [facility] 12/29

## 2024-12-10 NOTE — TOC Initial Note (Signed)
 Transition of Care [facility]) - Initial/Assessment Note    Patient Details  Name: Mario [name] MRN: [id number] Date of Birth: [birth year]  Transition of Care [facility]) CM/SW Contact:    Lauraine FORBES Saa, LCSWA Phone Number: 12/10/2024, 4:16 PM  Clinical Narrative:                  4:17 PM CSW introduced self and role to patient. CSW inquired about SDOH (food, housing, utilities) needs. Patient confirmed SDOH needs and accepted CSW offer of resources. CSW provided SDOH resources. Patient confirmed that he resides at home with spouse who drives patient to appointments. Patient confirmed that he does not have SNF or HH history but was agreeable with discharging home with [facility]. Patinet stated he did not have a HH agency preference. CSW made RNCM aware. Patinet stated that he has DME (nebulizer machine, RW, shower seat, BSC, toilet rise) at home and has yet received oxygen but confirmed DME history is with [employer]. Patient stated his insurance company typically chooses DME company. Patient expressed interest in new RW and lift chair. CSW made RNCM aware. TOC will continue to follow.  Expected Discharge Plan: Home w Home Health Services Barriers to Discharge: Continued Medical Work up   Patient Goals and CMS Choice Patient states their goals for this hospitalization and ongoing recovery are:: to return home with [facility]          Expected Discharge Plan and Services   Discharge Planning Services: CM Consult Post Acute Care Choice: Home Health, Durable Medical Equipment Living arrangements for the past 2 months: Single Family Home                                      Prior Living Arrangements/Services Living arrangements for the past 2 months: Single Family Home Lives with:: Spouse Patient language and need for interpreter reviewed:: Yes Do you feel safe going back to the place where you live?: Yes      Need for Family Participation in Patient Care: No (Comment)   Current home services:  DME Criminal Activity/Legal Involvement Pertinent to Current Situation/Hospitalization: No - Comment as needed  Activities of Daily Living   ADL Screening (condition at time of admission) Independently performs ADLs?: No Does the patient have a NEW difficulty with bathing/dressing/toileting/self-feeding that is expected to last >3 days?: Yes (Initiates electronic notice to provider for possible OT consult) Does the patient have a NEW difficulty with getting in/out of bed, walking, or climbing stairs that is expected to last >3 days?: Yes (Initiates electronic notice to provider for possible PT consult) Does the patient have a NEW difficulty with communication that is expected to last >3 days?: No Is the patient deaf or have difficulty hearing?: No Does the patient have difficulty seeing, even when wearing glasses/contacts?: No Does the patient have difficulty concentrating, remembering, or making decisions?: No  Permission Sought/Granted Permission sought to share information with : Family Supports, Oceanographer granted to share information with : No (Contact information on chart)  Share Information with NAME: Pepe Mineau  Permission granted to share info w AGENCY: HH  Permission granted to share info w Relationship: Spouse  Permission granted to share info w Contact Information: [phone]  Emotional Assessment       Orientation: : Oriented to Self, Oriented to Place, Oriented to  Time, Oriented to Situation Alcohol / Substance Use: Not Applicable Psych Involvement:  No (comment)  Admission diagnosis:  Pain in finger of left hand [M79.645] Swelling of left hand [M79.89] Patient Active Problem List   Diagnosis Date Noted   Swelling of left hand 12/07/2024   Physical deconditioning 06/07/2024   Thoracic compression fracture (HCC) 06/07/2024   Persistent atrial fibrillation (HCC) 05/14/2024   Acute on chronic diastolic (congestive) heart failure  (HCC) 04/17/2024   Atrial fibrillation with RVR (HCC) 04/15/2024   COPD with acute exacerbation (HCC) 04/15/2024   AKI (acute kidney injury) 04/15/2024   Preoperative respiratory examination 04/12/2024   Atrial fibrillation with rapid ventricular response (HCC) 02/15/2024   Low back pain 01/31/2024   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   Bilateral hearing loss 07/31/2023   Pain of right shoulder joint on movement 01/12/2023   Pain in joint of right shoulder 01/12/2023   History of COVID-19 12/14/2020   Healthcare maintenance 12/14/2020   Medication management 07/13/2020   Chronic anticoagulation 09/13/2019   Hiatal hernia 09/13/2019   History of colonic polyps 09/13/2019   Fatty liver 09/13/2019   Gastric reflux 09/13/2019   Cholecystitis 09/13/2019   Cyst of pancreas    Coronary artery calcification seen on CAT scan    Chronic cough 08/21/2018   Chronic frontal sinusitis 07/31/2018   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   Deviated nasal septum 07/31/2018   Very poorly controlled severe persistent asthma (HCC) 07/06/2018   Bronchiectasis without complication (HCC) 07/05/2018   Paroxysmal atrial fibrillation (HCC) 12/29/2017   Asthma with COPD (HCC)    Hyperlipidemia    Biliary colic 2014   Primary hypertension 12/24/2009   KNEE PAIN, LEFT 12/24/2009   MEDIAL MENISCUS TEAR, LEFT 12/24/2009   PCP:  [name] DASEN, MD Pharmacy:   [id number] - [place], [place] - [address] [place] [address] Phone: [phone] Fax: [phone]  [facility] [place]  - [place] - [address] [place] [address] Phone: [phone] Fax: [phone]  [facility] [address] [place] [address] Phone: [phone] Fax: [phone]     Social Drivers of Health (SDOH) Social History: SDOH Screenings   Food Insecurity: Food  Insecurity Present (12/07/2024)  Housing: High Risk (12/07/2024)  Transportation Needs: No Transportation Needs (12/07/2024)  Utilities: At Risk (12/07/2024)  Alcohol Screen: Low Risk (05/08/2024)  Depression (PHQ2-9): Low Risk (11/18/2024)  Financial Resource Strain: Medium Risk (10/19/2024)  Physical Activity: Inactive (10/19/2024)  Social Connections: Socially Integrated (12/07/2024)  Stress: No Stress Concern Present (10/19/2024)  Tobacco Use: Medium Risk (12/07/2024)  Health Literacy: Adequate Health Literacy (05/08/2024)   SDOH Interventions:     Readmission Risk Interventions    02/18/2024   10:38 AM  Readmission Risk Prevention Plan  Post Dischage Appt Complete  Medication Screening Complete  Transportation Screening Complete

## 2024-12-10 NOTE — Evaluation (Signed)
 Physical Therapy Evaluation Patient Details Name: KAHLEB MCCLANE MRN: [id number] DOB: 11-Feb-1946 Today's Date: 12/10/2024  History of Present Illness  Joseguadalupe Stan is a 78 y.o. male admitted on 12/07/24 forL hand swelling and pain.  Pt s/pL hand I and D and sink bx to r/o inflammatory condition on 12/07/24.  Pt with significant PMH of asthma, A-fib, COPD, HTN.  Clinical Impression  Pt is ambulating with assistance and L PFRW in room distances. He is weak, and generally deconditioned.  He is reporting frequent diarrhea (MD aware).  Pt would benefit from post acute therapies and support.  He reports getting weaker in his home.  We discussed post acute SNF therapies and he declined, wanting to go home at this time.   PT to follow acutely for deficits listed below.         If plan is discharge home, recommend the following: A little help with bathing/dressing/bathroom;Assistance with cooking/housework;Assist for transportation;Help with stairs or ramp for entrance;A little help with walking and/or transfers   Can travel by private vehicle        Equipment Recommendations Other (comment);Rolling walker (2 wheels) (he will need a left platform attachment for his RW)  Recommendations for Other Services  OT consult    Functional Status Assessment Patient has had a recent decline in their functional status and demonstrates the ability to make significant improvements in function in a reasonable and predictable amount of time.     Precautions / Restrictions Precautions Precautions: Fall;Other (comment) Precaution/Restrictions Comments: monitor O2 as he was not on it at home and is on it here in the hospital      Mobility  Bed Mobility Overal bed mobility: Needs Assistance Bed Mobility: Supine to Sit, Sit to Supine     Supine to sit: Supervision, HOB elevated, Used rails Sit to supine: Supervision, HOB elevated, Used rails   General bed mobility comments: Pt relied heavily on elevated  bed and rails to get into/out of bed today.  He normally sleeps in a recliner chair.    Transfers Overall transfer level: Needs assistance Equipment used: Left platform walker Transfers: Sit to/from Stand Sit to Stand: Min assist (from elevated bed and toliet with BSC over it)           General transfer comment: Min assist, heavy use of momentum to stand.    Ambulation/Gait Ambulation/Gait assistance: Min assist Gait Distance (Feet): 20 Feet (x2 to and from the bathroom) Assistive device: Left platform walker Gait Pattern/deviations: Step-through pattern, Shuffle, Trunk flexed       General Gait Details: Pt walked quickly because he did not feel strong on his feet and he was having bouts of diarrhea.  Speed was too quick to be safe even with min assist and L PFRW.  Stairs            Wheelchair Mobility     Tilt Bed    Modified Rankin (Stroke Patients Only)       Balance                                             Pertinent Vitals/Pain Pain Assessment Pain Assessment: No/denies pain    Home Living Family/patient expects to be discharged to:: Private residence Living Arrangements: Spouse/significant other Available Help at Discharge: Family;Other (Comment) (supervision only from wife) Type of Home: House Home Access: Stairs to enter  Entrance Stairs-Rails: Right Entrance Stairs-Number of Steps: 3 Alternate Level Stairs-Number of Steps: has not been able to go upstairs for a little while (since March), sleeping in recliner, has 15 steps Home Layout: Two level Home Equipment: Agricultural Consultant (2 wheels);Cane - single point;Grab bars - tub/shower;Hand held shower head      Prior Function Prior Level of Function : Independent/Modified Independent                     Extremity/Trunk Assessment   Upper Extremity Assessment Upper Extremity Assessment: Defer to OT evaluation    Lower Extremity Assessment Lower Extremity  Assessment: Generalized weakness (Pt grossly 3/5 bil LE with red, flaky skin)    Cervical / Trunk Assessment Cervical / Trunk Assessment: Kyphotic  Communication   Communication Communication: No apparent difficulties    Cognition Arousal: Alert Behavior During Therapy: WFL for tasks assessed/performed   PT - Cognitive impairments: No apparent impairments                         Following commands: Intact       Cueing       General Comments General comments (skin integrity, edema, etc.): Pt declined sitting up in the recliner chair.  Just walking to the bathroom fatigued him.  He requested to speak to SW/RNCM about getting him and his wife some assistance at home. He did not use O2 during our session and ranged from 91-94% on RA.  All other VSS throughout and were checked 3-4 times.    Exercises     Assessment/Plan    PT Assessment Patient needs continued PT services  PT Problem List Decreased strength;Decreased activity tolerance;Decreased balance;Decreased mobility;Decreased knowledge of use of DME;Cardiopulmonary status limiting activity;Decreased skin integrity       PT Treatment Interventions DME instruction;Gait training;Stair training;Functional mobility training;Therapeutic exercise;Therapeutic activities;Balance training;Patient/family education    PT Goals (Current goals can be found in the Care Plan section)  Acute Rehab PT Goals Patient Stated Goal: to get stronger on his feet and get a little help at home for him and his wife (wants an aide) PT Goal Formulation: With patient Time For Goal Achievement: 12/24/24 Potential to Achieve Goals: Good    Frequency Min 1X/week     Co-evaluation               AM-PAC PT 6 Clicks Mobility  Outcome Measure Help needed turning from your back to your side while in a flat bed without using bedrails?: A Little Help needed moving from lying on your back to sitting on the side of a flat bed without  using bedrails?: A Little Help needed moving to and from a bed to a chair (including a wheelchair)?: A Little Help needed standing up from a chair using your arms (e.g., wheelchair or bedside chair)?: A Little Help needed to walk in hospital room?: Total Help needed climbing 3-5 steps with a railing? : Total 6 Click Score: 14    End of Session Equipment Utilized During Treatment: Gait belt Activity Tolerance: Patient limited by fatigue Patient left: in bed;with call bell/phone within reach;with bed alarm set   PT Visit Diagnosis: Muscle weakness (generalized) (M62.81);Difficulty in walking, not elsewhere classified (R26.2)    Time: 8997-8952 PT Time Calculation (min) (ACUTE ONLY): 45 min   Charges:   PT Evaluation $PT Eval Moderate Complexity: 1 Mod PT Treatments $Therapeutic Activity: 23-37 mins PT General Charges $$ ACUTE PT VISIT: 1 Visit  Asberry HERO, PT, DPT  Acute Rehabilitation Secure chat is best for contact #[phone] office    12/10/2024, 11:44 AM

## 2024-12-10 NOTE — Progress Notes (Signed)
 " PROGRESS NOTE    Mario [name]  FMW:[id number] DOB: 1946/10/01 DOA: 12/07/2024 PCP: [name] DASEN, MD   Brief Narrative:  This 78 y.o. male with PMH significant for hypertension, hyperlipidemia, persistent atrial fibrillation, bronchiectasis, and OSA no longer on CPAP who presents with Left hand swelling. He has been experiencing hand swelling for the past couple of weeks without recalling any specific inciting event such as an insect bite. A similar episode occurred earlier this year, which resolved after surgical intervention. He is on Eliquis  for atrial fibrillation and has not taken his blood pressure medication this morning. He denies the use of CPAP at night and has no history of diabetes. He has been on doxycycline  for the last week.  Patient was admitted under orthopedics.  Assessment & Plan:   Principal Problem:   Swelling of left hand  Left hand swelling: Patient presented with left hand swelling for 2 weeks.   He had been on doxycycline  over the last week without improvement in symptoms.   Patient was taken to the operating room for debridement which did not reveal any purulent drainage and biopsy was done.   Cultures were sent, but prior cultures did not show any growth.  Possibility of infectious process versus inflammatory. ESR (44) and CRP (12.4) Continue empiric antibiotics (linezolid  and Augmentin ) as recommended by ID. Await biopsy.  ANA and hepatitis panel  negative. Likely inflammatory. Wound care per Hand surgery.   Leukocytosis: Leukocytosis likely secondary to above. Resolved.   Paroxysmal atrial fibrillation: Noted to be in atrial fibrillation currently.  Patient took Eliquis  2 days ago. Continue metoprolol  100 mg daily. Continue Cardizem  CD 240 mg daily. Continue to hold Eliquis  due to open wound.  Resume when deemed medically appropriate.   COPD: Patient without significant wheezing or rhonchi appreciated at this time. Continue breathing  treatments as needed.  Diarrhea: Patient reports having loose watery stools since yesterday, He has multiple episodes associated with cramps.  Follow-up GI panel.   DVT prophylaxis: SCDs Code Status: Full code Family Communication: No family at bed side. Disposition Plan:     Status is: Inpatient Remains inpatient appropriate because: Patient admitted for left hand infection,  underwent I&D.  Started on empiric antibiotics,  pending  cultures.  Patient not medically ready for discharge.    Consultants:  Hand Surgery  Procedures: I & D  Antimicrobials:  Anti-infectives (From admission, onward)    Start     Dose/Rate Route Frequency Ordered Stop   12/07/24 1545  linezolid  (ZYVOX ) tablet 600 mg        600 mg Oral Every 12 hours 12/07/24 1456     12/07/24 1545  amoxicillin -clavulanate (AUGMENTIN ) 875-125 MG per tablet 1 tablet        1 tablet Oral Every 12 hours 12/07/24 1456         Subjective: Patient was seen and examined at bedside. Overnight events noted. Patient reports feeling much improved, status post I&D.   Left hand in dressing.  Reports pain is controlled. He has developed diarrhea.  Objective: Vitals:   12/10/24 0320 12/10/24 0740 12/10/24 0828 12/10/24 1413  BP: 136/66 139/71  116/62  Pulse: (!) 55 73  87  Resp:  16  16  Temp: 98 F (36.7 C) (!) 97.3 F (36.3 C)  97.9 F (36.6 C)  TempSrc:      SpO2: 94% 98% 98% 93%  Weight:      Height:        Intake/Output Summary (  Last 24 hours) at 12/10/2024 1459 Last data filed at 12/10/2024 9062 Gross per 24 hour  Intake 460 ml  Output --  Net 460 ml    Filed Weights   12/07/24 0708  Weight: 88.9 kg    Examination:  General exam: Appears calm and comfortable, not in any acute distress.  Respiratory system: CTA Bilaterally. Respiratory effort normal. RR 15 Cardiovascular system: S1 & S2 heard, RRR. No JVD, murmurs, rubs, gallops or clicks.  Gastrointestinal system: Abdomen is non distended, soft  and non tender.  Normal bowel sounds heard. Central nervous system: Alert and oriented x 3. No focal neurological deficits. Extremities: No edema, no cyanosis, no clubbing.  Left hand in dressing. Skin: No rashes, lesions or ulcers Psychiatry: Judgement and insight appear normal. Mood & affect appropriate.    Data Reviewed: I have personally reviewed following labs and imaging studies  CBC: Recent Labs  Lab 12/07/24 0749 12/07/24 1158 12/08/24 0529  WBC  --  11.8* 9.9  NEUTROABS  --  9.5*  --   HGB 17.0 14.6 12.8*  HCT 50.0 45.1 40.5  MCV  --  95.1 95.7  PLT  --  346 287   Basic Metabolic Panel: Recent Labs  Lab 12/07/24 0749 12/08/24 0529  NA 140 137  K 3.7 3.7  CL 103 104  CO2  --  24  GLUCOSE 104* 89  BUN 28* 25*  CREATININE 1.00 1.00  CALCIUM   --  8.4*   GFR: Estimated Creatinine Clearance: 65.5 mL/min (by C-G formula based on SCr of 1 mg/dL). Liver Function Tests: No results for input(s): AST, ALT, ALKPHOS, BILITOT, PROT, ALBUMIN  in the last 168 hours. No results for input(s): LIPASE, AMYLASE in the last 168 hours. No results for input(s): AMMONIA in the last 168 hours. Coagulation Profile: No results for input(s): INR, PROTIME in the last 168 hours. Cardiac Enzymes: No results for input(s): CKTOTAL, CKMB, CKMBINDEX, TROPONINI in the last 168 hours. BNP (last 3 results) No results for input(s): PROBNP in the last 8760 hours. HbA1C: No results for input(s): HGBA1C in the last 72 hours. CBG: No results for input(s): GLUCAP in the last 168 hours. Lipid Profile: No results for input(s): CHOL, HDL, LDLCALC, TRIG, CHOLHDL, LDLDIRECT in the last 72 hours. Thyroid  Function Tests: No results for input(s): TSH, T4TOTAL, FREET4, T3FREE, THYROIDAB in the last 72 hours. Anemia Panel: No results for input(s): VITAMINB12, FOLATE, FERRITIN, TIBC, IRON , RETICCTPCT in the last 72 hours. Sepsis Labs: No  results for input(s): PROCALCITON, LATICACIDVEN in the last 168 hours.  Recent Results (from the past 240 hours)  Aerobic/Anaerobic Culture w Gram Stain (surgical/deep wound)     Status: None (Preliminary result)   Collection Time: 12/07/24 10:32 AM   Specimen: Wound; Tissue  Result Value Ref Range Status   Specimen Description WOUND  Final   Special Requests DEEP ABSCESS HAND  Final   Gram Stain   Final    RARE WBC PRESENT, PREDOMINANTLY PMN NO ORGANISMS SEEN    Culture   Final    NO GROWTH 3 DAYS NO ANAEROBES ISOLATED; CULTURE IN PROGRESS FOR 5 DAYS Performed at [facility], [address] [place], [place] [address]    Report Status PENDING  Incomplete  Aerobic/Anaerobic Culture w Gram Stain (surgical/deep wound)     Status: None (Preliminary result)   Collection Time: 12/07/24 10:32 AM   Specimen: Wound; Tissue  Result Value Ref Range Status   Specimen Description TISSUE  Final  Special Requests HAND WOUND  Final   Gram Stain   Final    RARE WBC PRESENT, PREDOMINANTLY PMN NO ORGANISMS SEEN Performed at [facility], [address], [place], [place] [address]    Culture   Final    FEW STAPHYLOCOCCUS EPIDERMIDIS NO ANAEROBES ISOLATED; CULTURE IN PROGRESS FOR 5 DAYS    Report Status PENDING  Incomplete   Organism ID, Bacteria STAPHYLOCOCCUS EPIDERMIDIS  Final      Susceptibility   Staphylococcus epidermidis - MIC*    CIPROFLOXACIN  >=8 RESISTANT Resistant     ERYTHROMYCIN >=8 RESISTANT Resistant     GENTAMICIN <=0.5 SENSITIVE Sensitive     OXACILLIN <=0.25 SENSITIVE Sensitive     TETRACYCLINE >=16 RESISTANT Resistant     VANCOMYCIN 1 SENSITIVE Sensitive     TRIMETH/SULFA 40 SENSITIVE Sensitive     CLINDAMYCIN >=8 RESISTANT Resistant     RIFAMPIN <=0.5 SENSITIVE Sensitive     Inducible Clindamycin NEGATIVE Sensitive     * FEW STAPHYLOCOCCUS EPIDERMIDIS  Acid Fast Culture with reflexed sensitivities     Status: None   Collection Time: 12/07/24 10:32 AM    Specimen: Wound; Tissue  Result Value Ref Range Status   Acid Fast Culture DUPLICATE  Final    Comment: (NOTE) Duplicate procedure ordered. Notified Fransisco Dirks 12/10/2024 Performed At: [facility] [address] [place], [place] [id number] Jennette Shorter MD Ey:[id number]    Source of Sample TISSUE  Final    Comment: Performed at [facility], [address] [place], [place] [address]  Acid Fast Smear (AFB)     Status: None   Collection Time: 12/07/24 10:32 AM   Specimen: Wound; Tissue  Result Value Ref Range Status   AFB Specimen Processing Concentration  Final   Acid Fast Smear Negative  Final    Comment: (NOTE) Performed At: [facility] [address] [place], [place] [id number] Jennette Shorter MD Ey:[id number]    Source (AFB) DEEP ABSCESS HAND  Final    Comment: Performed at [facility], [address] [place], [place] [address]  Acid Fast Smear (AFB)     Status: None   Collection Time: 12/07/24 10:32 AM   Specimen: Wound; Tissue  Result Value Ref Range Status   AFB Specimen Processing DUPLICATE  Final    Comment: (NOTE) Duplicate procedure ordered. Notified Tomadur Dirks 12/10/2024    Acid Fast Smear NOT PERFORMED  Final    Comment: (NOTE) Test not performed Performed At: [facility] [address] [place], [place] [id number] Jennette Shorter MD Ey:[id number]    Source (AFB) TISSUE  Final    Comment: Performed at [facility], [address], [place], [place] [address]  Fungus Stain     Status: None   Collection Time: 12/07/24 10:32 AM  Result Value Ref Range Status   FUNGUS STAIN DUPLICATE  Final    Comment: (NOTE) Duplicate procedure ordered. Notified Fransisco Dirks 12/10/2024 Performed At: [facility] [address] [place], [place] [id number] Jennette Shorter MD Ey:[id number]    Fungal Source TISSUE  Final    Comment: Performed at [facility], [address] [place], [place] [address]   Aerobic/Anaerobic Culture w Gram Stain (surgical/deep wound)     Status: None (Preliminary result)   Collection Time: 12/07/24 10:39 AM   Specimen: Wound  Result Value Ref Range Status   Specimen Description TISSUE  Final   Special Requests DEEP ABSCESS HAND  Final  Gram Stain   Final    FEW WBC PRESENT, PREDOMINANTLY PMN NO ORGANISMS SEEN    Culture   Final    NO GROWTH 3 DAYS NO ANAEROBES ISOLATED; CULTURE IN PROGRESS FOR 5 DAYS Performed at [facility], [address], [place], [place] [address]    Report Status PENDING  Incomplete  Acid Fast Smear (AFB)     Status: None   Collection Time: 12/07/24 10:39 AM   Specimen: Wound  Result Value Ref Range Status   AFB Specimen Processing Comment  Final    Comment: Tissue Grinding and Digestion/Decontamination   Acid Fast Smear Negative  Final    Comment: (NOTE) Performed At: [facility] [address] [place], [place] [id number] Jennette Shorter MD Ey:[id number]    Source (AFB) TISSUE  Final    Comment: Performed at [facility], [address] [place], [place] [address]  Fungus Stain     Status: None   Collection Time: 12/07/24 10:39 AM   Specimen: Wound; Tissue  Result Value Ref Range Status   FUNGUS STAIN DUPLICATE  Final    Comment: (NOTE) Duplicate procedure ordered. Notified Fransisco Dirks 12/10/2024 Performed At: [facility] [address] [place], [place] [id number] Jennette Shorter MD Ey:[id number]    Fungal Source TISSUE  Final    Comment: Performed at [facility], [address], [place], [place] [address]   Radiology Studies: No results found.  Scheduled Meds:  amoxicillin -clavulanate  1 tablet Oral Q12H   diltiazem   240 mg Oral Daily   feeding supplement  237 mL Oral BID BM   fluticasone  furoate-vilanterol  1 puff Inhalation Daily   furosemide   40 mg Oral Daily   hydrALAZINE   50 mg Oral Q8H   linezolid   600 mg Oral Q12H   metoprolol  succinate  100 mg Oral Daily    pantoprazole   40 mg Oral BID   rosuvastatin   20 mg Oral Daily   saccharomyces boulardii  250 mg Oral BID   Continuous Infusions:   LOS: 3 days    Time spent: 35 Mins    Darcel Dawley, MD [facility]   If 7PM-7AM, please contact night-coverage  "

## 2024-12-10 NOTE — TOC Progression Note (Signed)
 Transition of Care [facility]) - Progression Note    Patient Details  Name: Mario [name] MRN: [id number] Date of Birth: [birth year]  Transition of Care [facility]) CM/SW Contact  Tom-Johnson, Adaly Puder Daphne, RN Phone Number: 12/10/2024, 5:03 PM  Clinical Narrative:     CM spoke with patient at beside about home health recommendations. Patient states he has no preference. CM sent referral via hub, [facility] declined. CM called and spoke with Artavia with [facility], referral accepted, info on AVS.   CM will continue to follow as patient progresses with care towards discharge.      Expected Discharge Plan: Home w Home Health Services Barriers to Discharge: Continued Medical Work up               Expected Discharge Plan and Services   Discharge Planning Services: CM Consult Post Acute Care Choice: Home Health, Durable Medical Equipment Living arrangements for the past 2 months: Single Family Home                                       Social Drivers of Health (SDOH) Interventions SDOH Screenings   Food Insecurity: Food Insecurity Present (12/07/2024)  Housing: High Risk (12/07/2024)  Transportation Needs: No Transportation Needs (12/07/2024)  Utilities: At Risk (12/07/2024)  Alcohol Screen: Low Risk (05/08/2024)  Depression (PHQ2-9): Low Risk (11/18/2024)  Financial Resource Strain: Medium Risk (10/19/2024)  Physical Activity: Inactive (10/19/2024)  Social Connections: Socially Integrated (12/07/2024)  Stress: No Stress Concern Present (10/19/2024)  Tobacco Use: Medium Risk (12/07/2024)  Health Literacy: Adequate Health Literacy (05/08/2024)    Readmission Risk Interventions    02/18/2024   10:38 AM  Readmission Risk Prevention Plan  Post Dischage Appt Complete  Medication Screening Complete  Transportation Screening Complete

## 2024-12-11 ENCOUNTER — Inpatient Hospital Stay (HOSPITAL_COMMUNITY)

## 2024-12-11 DIAGNOSIS — M7989 Other specified soft tissue disorders: Secondary | ICD-10-CM | POA: Diagnosis not present

## 2024-12-11 DIAGNOSIS — I38 Endocarditis, valve unspecified: Secondary | ICD-10-CM

## 2024-12-11 LAB — GASTROINTESTINAL PANEL BY PCR, STOOL (REPLACES STOOL CULTURE)

## 2024-12-11 LAB — FUNGUS STAIN

## 2024-12-11 LAB — FUNGAL STAIN REFLEX

## 2024-12-11 LAB — ECHOCARDIOGRAM COMPLETE BUBBLE STUDY
Area-P 1/2: 3.76 cm2
Calc EF: 52.3 %
S' Lateral: 2.4 cm
Single Plane A2C EF: 52.7 %
Single Plane A4C EF: 53.3 %

## 2024-12-11 MED ORDER — AMOXICILLIN-POT CLAVULANATE 875-125 MG PO TABS: 1.0000 | ORAL_TABLET | Freq: Two times a day (BID) | ORAL | 0 refills | Status: DC

## 2024-12-11 MED ORDER — LINEZOLID 600 MG PO TABS: 600.0000 mg | ORAL_TABLET | Freq: Two times a day (BID) | ORAL | 0 refills | Status: DC

## 2024-12-11 NOTE — Plan of Care (Signed)

## 2024-12-11 NOTE — Discharge Summary (Signed)
 Physician Discharge Summary  Mario [name] FMW:[id number] DOB: 1946-01-18 DOA: 12/07/2024  PCP: [name] DASEN, MD  Admit date: 12/07/2024  Discharge date: 12/11/2024  Admitted From: Home.  Disposition:  Home Health Services  Recommendations for Outpatient Follow-up:  Follow up with PCP in 1-2 weeks. Please obtain BMP/CBC in one week. Advised to follow-up with orthopedics as scheduled. Advised to take Augmentin  and linezolid  until follow up with ID on 12/26/23. Follow-up ID as scheduled on 12/25/2024.  Home Health: Home Health Services Equipment/Devices:None  Discharge Condition: Stable CODE STATUS:Full code Diet recommendation: Heart Healthy   Brief [facility] Course: This 78 y.o. male with PMH significant for hypertension, hyperlipidemia, persistent atrial fibrillation, bronchiectasis, and OSA no longer on CPAP who presents with Left hand swelling. He has been experiencing hand swelling for the past couple of weeks without recalling any specific inciting event such as an insect bite. A similar episode occurred earlier this year, which resolved after surgical intervention. He is on Eliquis  for atrial fibrillation and has not taken his blood pressure medication this morning. He denies the use of CPAP at night and has no history of diabetes. He has been on doxycycline  for the last week.  Patient was admitted under orthopedics.  Patient underwent debridement which did not reveal any purulent drainage.  Biopsy was taken.  Patient continued on empiric antibiotics ( linezolid  and Augmentin  ) as recommended by ID.  Patient had dressing changed.  Hand looked so much better.  New dressing applied.  Patient cleared from orthopedics to be discharged home on p.o. antibiotics.  Follow-up in orthopedic office on Monday.  Patient is discharged on linezolid  and Augmentin  twice a day until follow-up with ID on 12/25/2024.  Discharge Diagnoses:  Principal Problem:   Swelling of left hand  Left  hand swelling: Patient presented with left hand swelling for 2 weeks.   He had been on doxycycline  over the last week without improvement in symptoms.   Patient was taken to the operating room for debridement which did not reveal any purulent drainage and biopsy was done.   Cultures were sent, but prior cultures did not show any growth.  Possibility of infectious process versus inflammatory. ESR (44) and CRP (12.4) Continue empiric antibiotics (linezolid  and Augmentin ) as recommended by ID. Await biopsy.  ANA and hepatitis panel  negative. Likely inflammatory. Wound care per Hand surgery. Patient cleared from Dr. Ortman for discharge on oral antibiotics. ID recommended continuing Augmentin  and linezolid  twice a day until follow-up with ID on January 7/26.   Leukocytosis: Leukocytosis likely secondary to above. Resolved.   Paroxysmal atrial fibrillation: Noted to be in atrial fibrillation currently.  Patient took Eliquis  2 days ago. Continue metoprolol  100 mg daily. Continue Cardizem  CD 240 mg daily. Continue to hold Eliquis  due to open wound.   Eliquis  resumed.   COPD: Patient without significant wheezing or rhonchi appreciated at this time. Continue breathing treatments as needed.   Diarrhea: Patient reports having loose watery stools since yesterday, He has multiple episodes associated with cramps.  GI panel negative.  Diarrhea improved.   Discharge Instructions  Discharge Instructions     Call MD for:  difficulty breathing, headache or visual disturbances   Complete by: As directed    Call MD for:  persistant dizziness or light-headedness   Complete by: As directed    Call MD for:  persistant nausea and vomiting   Complete by: As directed    Diet general   Complete by: As directed  Discharge instructions   Complete by: As directed    Advised to follow-up with primary care physician in 1 week. Advised to follow-up with orthopedics as scheduled. Advised to take  Augmentin  and linezolid  for 10 days.   Discharge wound care:   Complete by: As directed    Follow up orthopaedics as scheduled.   Increase activity slowly   Complete by: As directed       Allergies as of 12/11/2024       Reactions   Dust Mite Extract Other (See Comments)   Asthma related        Medication List     STOP taking these medications    Breztri  Aerosphere 160-9-4.8 MCG/ACT Aero inhaler Generic drug: budesonide -glycopyrrolate-formoterol    doxycycline  100 MG capsule Commonly known as: VIBRAMYCIN    Dupixent  300 MG/2ML prefilled syringe Generic drug: dupilumab        TAKE these medications    acetaminophen  500 MG tablet Commonly known as: TYLENOL  Take 1,000 mg by mouth every 6 (six) hours as needed for mild pain (pain score 1-3). What changed: when to take this   alendronate  70 MG tablet Commonly known as: FOSAMAX  Take 1 tablet (70 mg total) by mouth every 7 (seven) days. Take with a full glass of water  on an empty stomach. What changed: additional instructions   amoxicillin -clavulanate 875-125 MG tablet Commonly known as: AUGMENTIN  Take 1 tablet by mouth every 12 (twelve) hours for 14 days.   budesonide -formoterol  160-4.5 MCG/ACT inhaler Commonly known as: SYMBICORT  Inhale 2 puffs into the lungs 2 (two) times daily.   diltiazem  240 MG 24 hr capsule Commonly known as: DILACOR XR  Take 1 capsule (240 mg total) by mouth daily.   dorzolamide -timolol  2-0.5 % ophthalmic solution Commonly known as: COSOPT  Place 1 drop into the right eye daily.   Eliquis  5 MG Tabs tablet Generic drug: apixaban  TAKE 1 TABLET BY MOUTH TWICE A DAY   FIBER COMPLETE PO Take 1 tablet by mouth daily as needed (Constipation).   Flutter Devi Use as directed   furosemide  40 MG tablet Commonly known as: LASIX  Take 1 tablet (40 mg total) by mouth daily. Take additional 20 mg if your weight is 3 lbs in 1 day of 5 lbs in 1 week   guaiFENesin  600 MG 12 hr tablet Commonly  known as: MUCINEX  Take 1 tablet (600 mg total) by mouth 2 (two) times daily.   Iron  (Ferrous Sulfate ) 325 (65 Fe) MG Tabs Take 325 mg by mouth daily.   levalbuterol  0.63 MG/3ML nebulizer solution Commonly known as: XOPENEX  Take 3 mLs (0.63 mg total) by nebulization 4 (four) times daily for 7 days, THEN 3 mLs (0.63 mg total) every 6 (six) hours as needed for wheezing or shortness of breath. Start taking on: October 21, 2024   levalbuterol  45 MCG/ACT inhaler Commonly known as: Xopenex  HFA Inhale 2 puffs into the lungs every 6 (six) hours as needed for wheezing.   linezolid  600 MG tablet Commonly known as: ZYVOX  Take 1 tablet (600 mg total) by mouth every 12 (twelve) hours for 18 days.   metoprolol  succinate 100 MG 24 hr tablet Commonly known as: TOPROL -XL Take 1 tablet (100 mg total) by mouth daily. Take with or immediately following a meal.   montelukast  10 MG tablet Commonly known as: SINGULAIR  TAKE 1 TABLET BY MOUTH EVERY DAY   multivitamin with minerals tablet Take 1 tablet by mouth daily.   pantoprazole  40 MG tablet Commonly known as: PROTONIX  TAKE 1 TABLET BY MOUTH  TWICE A DAY   rosuvastatin  20 MG tablet Commonly known as: CRESTOR  TAKE 1 TABLET BY MOUTH EVERY DAY **D/C LIPITOR**               Discharge Care Instructions  (From admission, onward)           Start     Ordered   12/11/24 0000  Discharge wound care:       Comments: Follow up orthopaedics as scheduled.   12/11/24 1455            Contact information for follow-up providers     Shari Easter, MD Follow up in 5 day(s).   Specialty: Orthopedic Surgery Contact information: [address] [place] [address] [place] [address] [phone]         [name] DASEN, MD Follow up in 1 week(s).   Specialty: Family Medicine Contact information: [address] [place] [address] [phone]         [facility]              Follow up in 1  week(s).   Contact information: [address] [place]  [address] [phone]        [facility] Follow up in 1 week(s).               Contact information for after-discharge care     Home Medical Care     [facility] Follow up.   Service: Home Health Services Why: Someone will call you to schedule first home visit. Contact information: [address] [place]  [address] [phone]                    Allergies[1]  Consultations: Hand surgery   Procedures/Studies: Debridement and washout.  Subjective: Patient was seen and examined at bedside.  Overnight events noted. Patient reports feeling much better,  Hand looks better and he wants to be discharged.  Discharge Exam: Vitals:   12/11/24 0940 12/11/24 1406  BP:  (!) 105/92  Pulse:  78  Resp:  17  Temp:  (!) 97.4 F (36.3 C)  SpO2: 96% 97%   Vitals:   12/11/24 0500 12/11/24 0802 12/11/24 0940 12/11/24 1406  BP: 110/76 123/87  (!) 105/92  Pulse: 95 99  78  Resp:  17  17  Temp: 97.7 F (36.5 C) 97.7 F (36.5 C)  (!) 97.4 F (36.3 C)  TempSrc: Oral   Oral  SpO2: 99% 91% 96% 97%  Weight:      Height:        General: Pt is alert, awake, not in acute distress Cardiovascular: RRR, S1/S2 +, no rubs, no gallops Respiratory: CTA bilaterally, no wheezing, no rhonchi Abdominal: Soft, NT, ND, bowel sounds + Extremities: no edema, no cyanosis, left hand in dressing.    The results of significant diagnostics from this hospitalization (including imaging, microbiology, ancillary and laboratory) are listed below for reference.     Microbiology: Recent Results (from the past 240 hours)  Aerobic/Anaerobic Culture w Gram Stain (surgical/deep wound)     Status: None (Preliminary result)   Collection Time: 12/07/24 10:32 AM   Specimen: Wound; Tissue  Result Value Ref Range Status   Specimen Description WOUND  Final   Special  Requests DEEP ABSCESS HAND  Final   Gram Stain   Final    RARE WBC PRESENT, PREDOMINANTLY PMN NO ORGANISMS SEEN    Culture  Final    NO GROWTH 4 DAYS NO ANAEROBES ISOLATED; CULTURE IN PROGRESS FOR 5 DAYS Performed at [facility], [address], [place], [place] [address]    Report Status PENDING  Incomplete  Aerobic/Anaerobic Culture w Gram Stain (surgical/deep wound)     Status: None (Preliminary result)   Collection Time: 12/07/24 10:32 AM   Specimen: Wound; Tissue  Result Value Ref Range Status   Specimen Description TISSUE  Final   Special Requests HAND WOUND  Final   Gram Stain   Final    RARE WBC PRESENT, PREDOMINANTLY PMN NO ORGANISMS SEEN Performed at [facility], [address], [place], [place] [address]    Culture   Final    FEW STAPHYLOCOCCUS EPIDERMIDIS NO ANAEROBES ISOLATED; CULTURE IN PROGRESS FOR 5 DAYS    Report Status PENDING  Incomplete   Organism ID, Bacteria STAPHYLOCOCCUS EPIDERMIDIS  Final      Susceptibility   Staphylococcus epidermidis - MIC*    CIPROFLOXACIN  >=8 RESISTANT Resistant     ERYTHROMYCIN >=8 RESISTANT Resistant     GENTAMICIN <=0.5 SENSITIVE Sensitive     OXACILLIN <=0.25 SENSITIVE Sensitive     TETRACYCLINE >=16 RESISTANT Resistant     VANCOMYCIN 1 SENSITIVE Sensitive     TRIMETH/SULFA 40 SENSITIVE Sensitive     CLINDAMYCIN >=8 RESISTANT Resistant     RIFAMPIN <=0.5 SENSITIVE Sensitive     Inducible Clindamycin NEGATIVE Sensitive     * FEW STAPHYLOCOCCUS EPIDERMIDIS  Acid Fast Culture with reflexed sensitivities     Status: None   Collection Time: 12/07/24 10:32 AM   Specimen: Wound; Tissue  Result Value Ref Range Status   Acid Fast Culture DUPLICATE  Final    Comment: (NOTE) Duplicate procedure ordered. Notified Fransisco Dirks 12/10/2024 Performed At: [facility] [address] [place], [place] [id number] Jennette Shorter MD Ey:[id number]    Source of Sample TISSUE  Final    Comment: Performed at [facility], [address] [place], [place] [address]  Acid Fast Smear (AFB)     Status: None   Collection Time: 12/07/24 10:32 AM   Specimen: Wound; Tissue  Result Value Ref Range Status   AFB Specimen Processing Concentration  Final   Acid Fast Smear Negative  Final    Comment: (NOTE) Performed At: [facility] [address] [place], [place] [id number] Jennette Shorter MD Ey:[id number]    Source (AFB) DEEP ABSCESS HAND  Final    Comment: Performed at [facility], [address] [place], [place] [address]  Acid Fast Smear (AFB)     Status: None   Collection Time: 12/07/24 10:32 AM   Specimen: Wound; Tissue  Result Value Ref Range Status   AFB Specimen Processing DUPLICATE  Final    Comment: (NOTE) Duplicate procedure ordered. Notified Tomadur Dirks 12/10/2024    Acid Fast Smear NOT PERFORMED  Final    Comment: (NOTE) Test not performed Performed At: [facility] [address] [place], [place] [id number] Jennette Shorter MD Ey:[id number]    Source (AFB) TISSUE  Final    Comment: Performed at [facility], [address], [place], [place] [address]  Fungus Stain     Status: None   Collection Time: 12/07/24 10:32 AM  Result Value Ref Range Status   FUNGUS STAIN DUPLICATE  Final    Comment: (NOTE) Duplicate procedure ordered. Notified Fransisco Dirks 12/10/2024 Performed At: [facility] [address] [place], [place] [id number]  Jennette Shorter MD Ey:[id number]    Fungal Source TISSUE  Final    Comment: Performed at [facility], [address], [place], [place] [address]  Aerobic/Anaerobic Culture w Gram Stain (surgical/deep wound)     Status: None (Preliminary result)   Collection Time: 12/07/24 10:39 AM   Specimen: Wound  Result Value Ref Range Status   Specimen Description TISSUE  Final   Special Requests DEEP ABSCESS HAND  Final   Gram Stain   Final    FEW WBC PRESENT, PREDOMINANTLY PMN NO ORGANISMS SEEN    Culture    Final    NO GROWTH 4 DAYS NO ANAEROBES ISOLATED; CULTURE IN PROGRESS FOR 5 DAYS Performed at [facility], [address], [place], [place] [address]    Report Status PENDING  Incomplete  Fungus Stain     Status: None   Collection Time: 12/07/24 10:39 AM   Specimen: Wound  Result Value Ref Range Status   FUNGUS STAIN Final report  Final    Comment: (NOTE) Performed At: [facility] [address] [place], [place] [id number] Jennette Shorter MD Ey:[id number]    Fungal Source WOUND  Final    Comment: Performed at [facility], [address] [place], [place] [address]  Acid Fast Smear (AFB)     Status: None   Collection Time: 12/07/24 10:39 AM   Specimen: Wound  Result Value Ref Range Status   AFB Specimen Processing Comment  Final    Comment: Tissue Grinding and Digestion/Decontamination   Acid Fast Smear Negative  Final    Comment: (NOTE) Performed At: [facility] [address] [place], [place] [id number] Jennette Shorter MD Ey:[id number]    Source (AFB) TISSUE  Final    Comment: Performed at [facility], [address], [place], [place] [address]  Fungus Stain     Status: None   Collection Time: 12/07/24 10:39 AM   Specimen: Wound; Tissue  Result Value Ref Range Status   FUNGUS STAIN DUPLICATE  Final    Comment: (NOTE) Duplicate procedure ordered. Notified Fransisco Dirks 12/10/2024 Performed At: [facility] [address] [place], [place] [id number] Jennette Shorter MD Ey:[id number]    Fungal Source TISSUE  Final    Comment: Performed at [facility], [address], [place], [place] [address]  Fungal Stain reflex     Status: None   Collection Time: 12/07/24 10:39 AM  Result Value Ref Range Status   Fungal stain result 1 Comment  Final    Comment: (NOTE) KOH/Calcofluor preparation:  no fungus observed. Performed At: [facility] [address] [place], [place] [id number] Jennette Shorter MD Ey:[id number]    Gastrointestinal Panel by PCR , Stool     Status: None   Collection Time: 12/10/24  2:58 PM   Specimen: Stool  Result Value Ref Range Status   Campylobacter species NOT DETECTED NOT DETECTED Final   Plesimonas shigelloides NOT DETECTED NOT DETECTED Final   Salmonella species NOT DETECTED NOT DETECTED Final   Yersinia enterocolitica NOT DETECTED NOT DETECTED Final   Vibrio species NOT DETECTED NOT DETECTED Final   Vibrio cholerae NOT DETECTED NOT DETECTED Final   Enteroaggregative E coli (EAEC) NOT DETECTED NOT DETECTED Final   Enteropathogenic E coli (EPEC) NOT DETECTED NOT DETECTED Final   Enterotoxigenic E coli (ETEC) NOT DETECTED NOT DETECTED Final   Shiga like toxin producing E coli (STEC) NOT DETECTED NOT DETECTED Final   Shigella/Enteroinvasive E coli (  EIEC) NOT DETECTED NOT DETECTED Final   Cryptosporidium NOT DETECTED NOT DETECTED Final   Cyclospora cayetanensis NOT DETECTED NOT DETECTED Final   Entamoeba histolytica NOT DETECTED NOT DETECTED Final   Giardia lamblia NOT DETECTED NOT DETECTED Final   Adenovirus F40/41 NOT DETECTED NOT DETECTED Final   Astrovirus NOT DETECTED NOT DETECTED Final   Norovirus GI/GII NOT DETECTED NOT DETECTED Final   Rotavirus A NOT DETECTED NOT DETECTED Final   Sapovirus (I, II, IV, and V) NOT DETECTED NOT DETECTED Final    Comment: Performed at [facility], [address] [place], [place] [address]     Labs: BNP (last 3 results) Recent Labs    04/15/24 1854 05/07/24 1652 10/21/24 1211  BNP 644.3* 132* 188*   Basic Metabolic Panel: Recent Labs  Lab 12/07/24 0749 12/08/24 0529  NA 140 137  K 3.7 3.7  CL 103 104  CO2  --  24  GLUCOSE 104* 89  BUN 28* 25*  CREATININE 1.00 1.00  CALCIUM   --  8.4*   Liver Function Tests: No results for input(s): AST, ALT, ALKPHOS, BILITOT, PROT, ALBUMIN  in the last 168 hours. No results for input(s): LIPASE, AMYLASE in the last 168 hours. No results for input(s):  AMMONIA in the last 168 hours. CBC: Recent Labs  Lab 12/07/24 0749 12/07/24 1158 12/08/24 0529  WBC  --  11.8* 9.9  NEUTROABS  --  9.5*  --   HGB 17.0 14.6 12.8*  HCT 50.0 45.1 40.5  MCV  --  95.1 95.7  PLT  --  346 287   Cardiac Enzymes: No results for input(s): CKTOTAL, CKMB, CKMBINDEX, TROPONINI in the last 168 hours. BNP: Invalid input(s): POCBNP CBG: No results for input(s): GLUCAP in the last 168 hours. D-Dimer No results for input(s): DDIMER in the last 72 hours. Hgb A1c No results for input(s): HGBA1C in the last 72 hours. Lipid Profile No results for input(s): CHOL, HDL, LDLCALC, TRIG, CHOLHDL, LDLDIRECT in the last 72 hours. Thyroid  function studies No results for input(s): TSH, T4TOTAL, T3FREE, THYROIDAB in the last 72 hours.  Invalid input(s): FREET3 Anemia work up No results for input(s): VITAMINB12, FOLATE, FERRITIN, TIBC, IRON , RETICCTPCT in the last 72 hours. Urinalysis    Component Value Date/Time   COLORURINE STRAW (A) 04/15/2024 2300   APPEARANCEUR CLEAR 04/15/2024 2300   LABSPEC 1.008 04/15/2024 2300   PHURINE 5.0 04/15/2024 2300   GLUCOSEU NEGATIVE 04/15/2024 2300   HGBUR NEGATIVE 04/15/2024 2300   BILIRUBINUR NEGATIVE 04/15/2024 2300   KETONESUR NEGATIVE 04/15/2024 2300   PROTEINUR NEGATIVE 04/15/2024 2300   UROBILINOGEN 1.0 12/12/2012 0908   NITRITE NEGATIVE 04/15/2024 2300   LEUKOCYTESUR NEGATIVE 04/15/2024 2300   Sepsis Labs Recent Labs  Lab 12/07/24 1158 12/08/24 0529  WBC 11.8* 9.9   Microbiology Recent Results (from the past 240 hours)  Aerobic/Anaerobic Culture w Gram Stain (surgical/deep wound)     Status: None (Preliminary result)   Collection Time: 12/07/24 10:32 AM   Specimen: Wound; Tissue  Result Value Ref Range Status   Specimen Description WOUND  Final   Special Requests DEEP ABSCESS HAND  Final   Gram Stain   Final    RARE WBC PRESENT, PREDOMINANTLY PMN NO  ORGANISMS SEEN    Culture   Final    NO GROWTH 4 DAYS NO ANAEROBES ISOLATED; CULTURE IN PROGRESS FOR 5 DAYS Performed at [facility], [address], [place], [place] [address]    Report Status PENDING  Incomplete  Aerobic/Anaerobic Culture w Gram Stain (surgical/deep wound)     Status: None (Preliminary result)   Collection Time: 12/07/24 10:32 AM   Specimen: Wound; Tissue  Result Value Ref Range Status   Specimen Description TISSUE  Final   Special Requests HAND WOUND  Final   Gram Stain   Final    RARE WBC PRESENT, PREDOMINANTLY PMN NO ORGANISMS SEEN Performed at [facility], [address], [place], [place] [address]    Culture   Final    FEW STAPHYLOCOCCUS EPIDERMIDIS NO ANAEROBES ISOLATED; CULTURE IN PROGRESS FOR 5 DAYS    Report Status PENDING  Incomplete   Organism ID, Bacteria STAPHYLOCOCCUS EPIDERMIDIS  Final      Susceptibility   Staphylococcus epidermidis - MIC*    CIPROFLOXACIN  >=8 RESISTANT Resistant     ERYTHROMYCIN >=8 RESISTANT Resistant     GENTAMICIN <=0.5 SENSITIVE Sensitive     OXACILLIN <=0.25 SENSITIVE Sensitive     TETRACYCLINE >=16 RESISTANT Resistant     VANCOMYCIN 1 SENSITIVE Sensitive     TRIMETH/SULFA 40 SENSITIVE Sensitive     CLINDAMYCIN >=8 RESISTANT Resistant     RIFAMPIN <=0.5 SENSITIVE Sensitive     Inducible Clindamycin NEGATIVE Sensitive     * FEW STAPHYLOCOCCUS EPIDERMIDIS  Acid Fast Culture with reflexed sensitivities     Status: None   Collection Time: 12/07/24 10:32 AM   Specimen: Wound; Tissue  Result Value Ref Range Status   Acid Fast Culture DUPLICATE  Final    Comment: (NOTE) Duplicate procedure ordered. Notified Fransisco Dirks 12/10/2024 Performed At: [facility] [address] [place], [place] [id number] Jennette Shorter MD Ey:[id number]    Source of Sample TISSUE  Final    Comment: Performed at [facility], [address], [place], [place] [address]  Acid Fast Smear (AFB)     Status: None    Collection Time: 12/07/24 10:32 AM   Specimen: Wound; Tissue  Result Value Ref Range Status   AFB Specimen Processing Concentration  Final   Acid Fast Smear Negative  Final    Comment: (NOTE) Performed At: [facility] [address] [place], [place] [id number] Jennette Shorter MD Ey:[id number]    Source (AFB) DEEP ABSCESS HAND  Final    Comment: Performed at [facility], [address], [place], [place] [address]  Acid Fast Smear (AFB)     Status: None   Collection Time: 12/07/24 10:32 AM   Specimen: Wound; Tissue  Result Value Ref Range Status   AFB Specimen Processing DUPLICATE  Final    Comment: (NOTE) Duplicate procedure ordered. Notified Tomadur Dirks 12/10/2024    Acid Fast Smear NOT PERFORMED  Final    Comment: (NOTE) Test not performed Performed At: [facility] [address] [place], [place] [id number] Jennette Shorter MD Ey:[id number]    Source (AFB) TISSUE  Final    Comment: Performed at [facility], [address] [place], [place] [address]  Fungus Stain     Status: None   Collection Time: 12/07/24 10:32 AM  Result Value Ref Range Status   FUNGUS STAIN DUPLICATE  Final    Comment: (NOTE) Duplicate procedure ordered. Notified Fransisco Dirks 12/10/2024 Performed At: [facility] [address] [place], [place] [id number] Jennette Shorter MD Ey:[id number]    Fungal Source TISSUE  Final    Comment: Performed at [facility], [address], [place], [place] [address]  Aerobic/Anaerobic Culture w Gram Stain (surgical/deep wound)  Status: None (Preliminary result)   Collection Time: 12/07/24 10:39 AM   Specimen: Wound  Result Value Ref Range Status   Specimen Description TISSUE  Final   Special Requests DEEP ABSCESS HAND  Final   Gram Stain   Final    FEW WBC PRESENT, PREDOMINANTLY PMN NO ORGANISMS SEEN    Culture   Final    NO GROWTH 4 DAYS NO ANAEROBES ISOLATED; CULTURE IN PROGRESS FOR 5 DAYS Performed at [facility], [address] [place], [place] [address]    Report Status PENDING  Incomplete  Fungus Stain     Status: None   Collection Time: 12/07/24 10:39 AM   Specimen: Wound  Result Value Ref Range Status   FUNGUS STAIN Final report  Final    Comment: (NOTE) Performed At: [facility] [address] [place], [place] [id number] Jennette Shorter MD Ey:[id number]    Fungal Source WOUND  Final    Comment: Performed at [facility], [address] [place], [place] [address]  Acid Fast Smear (AFB)     Status: None   Collection Time: 12/07/24 10:39 AM   Specimen: Wound  Result Value Ref Range Status   AFB Specimen Processing Comment  Final    Comment: Tissue Grinding and Digestion/Decontamination   Acid Fast Smear Negative  Final    Comment: (NOTE) Performed At: [facility] [address] [place], [place] [id number] Jennette Shorter MD Ey:[id number]    Source (AFB) TISSUE  Final    Comment: Performed at [facility], [address] [place], [place] [address]  Fungus Stain     Status: None   Collection Time: 12/07/24 10:39 AM   Specimen: Wound; Tissue  Result Value Ref Range Status   FUNGUS STAIN DUPLICATE  Final    Comment: (NOTE) Duplicate procedure ordered. Notified Fransisco Dirks 12/10/2024 Performed At: [facility] [address] [place], [place] [id number] Jennette Shorter MD Ey:[id number]    Fungal Source TISSUE  Final    Comment: Performed at [facility], [address] [place], [place] [address]  Fungal Stain reflex     Status: None   Collection Time: 12/07/24 10:39 AM  Result Value Ref Range Status   Fungal stain result 1 Comment  Final    Comment: (NOTE) KOH/Calcofluor preparation:  no fungus observed. Performed At: [facility] [address] [place], [place] [id number] Jennette Shorter MD Ey:[id number]   Gastrointestinal Panel by PCR , Stool     Status: None   Collection Time: 12/10/24  2:58 PM   Specimen:  Stool  Result Value Ref Range Status   Campylobacter species NOT DETECTED NOT DETECTED Final   Plesimonas shigelloides NOT DETECTED NOT DETECTED Final   Salmonella species NOT DETECTED NOT DETECTED Final   Yersinia enterocolitica NOT DETECTED NOT DETECTED Final   Vibrio species NOT DETECTED NOT DETECTED Final   Vibrio cholerae NOT DETECTED NOT DETECTED Final   Enteroaggregative E coli (EAEC) NOT DETECTED NOT DETECTED Final   Enteropathogenic E coli (EPEC) NOT DETECTED NOT DETECTED Final   Enterotoxigenic E coli (ETEC) NOT DETECTED NOT DETECTED Final   Shiga like toxin producing E coli (STEC) NOT DETECTED NOT DETECTED Final   Shigella/Enteroinvasive E coli (EIEC) NOT DETECTED NOT DETECTED Final   Cryptosporidium NOT DETECTED NOT DETECTED Final   Cyclospora cayetanensis NOT DETECTED NOT DETECTED Final   Entamoeba histolytica NOT DETECTED NOT DETECTED Final   Giardia lamblia NOT DETECTED NOT DETECTED Final  Adenovirus F40/41 NOT DETECTED NOT DETECTED Final   Astrovirus NOT DETECTED NOT DETECTED Final   Norovirus GI/GII NOT DETECTED NOT DETECTED Final   Rotavirus A NOT DETECTED NOT DETECTED Final   Sapovirus (I, II, IV, and V) NOT DETECTED NOT DETECTED Final    Comment: Performed at [facility], [address], [place], [place] [address]     Time coordinating discharge: Over 30 minutes  SIGNED:   Darcel Dawley, MD  [facility] 12/11/2024, 3:55 PM Pager   If 7PM-7AM, please contact night-coverage       [1]  Allergies Allergen Reactions   Dust Mite Extract Other (See Comments)    Asthma related

## 2024-12-11 NOTE — Evaluation (Signed)
 Occupational Therapy Evaluation Patient Details Name: Mario [name] MRN: [id number] DOB: 06/01/1946 Today's Date: 12/11/2024   History of Present Illness   Mario [name] is a 78 y.o. male admitted on 12/07/24 forL hand swelling and pain.  Pt s/pL hand I and D and sink bx to r/o inflammatory condition on 12/07/24.  Pt with significant PMH of asthma, A-fib, COPD, HTN.     Clinical Impressions PTA pt lives @ modified independent level with his wife. Pt states ADL tasks are difficult and take increased time. L hand has improved and pt plans to follow up with Dr Shari. Recommend HHOT initially then transition to outp OT for hand therapy. CM made aware.      If plan is discharge home, recommend the following:   A little help with bathing/dressing/bathroom;Assistance with cooking/housework;Assist for transportation     Functional Status Assessment   Patient has had a recent decline in their functional status and demonstrates the ability to make significant improvements in function in a reasonable and predictable amount of time.     Equipment Recommendations   None recommended by OT     Recommendations for Other Services         Precautions/Restrictions   Precautions Precautions: Fall;Other (comment) Precaution/Restrictions Comments: reports wearing O2 at night only Restrictions Weight Bearing Restrictions Per Provider Order: No     Mobility Bed Mobility   Bed Mobility: Supine to Sit, Sit to Supine     Supine to sit: Min assist Sit to supine: Supervision, HOB elevated, Used rails   General bed mobility comments: Min assistance for trunk elevation to scoot to edge of bed. Uses recliner    Transfers Overall transfer level: Needs assistance Equipment used: Left platform walker (reports he does not like this device) Transfers: Sit to/from Stand Sit to Stand: From elevated surface, Supervision (elevated surface height of his bed.)                   Balance Overall balance assessment: Needs assistance   Sitting balance-Leahy Scale: Good       Standing balance-Leahy Scale: Fair                             ADL either performed or assessed with clinical judgement   ADL Overall ADL's : Needs assistance/impaired                                       General ADL Comments: Able to complete ADL tasks overall with min A and incresaed time. States it takes him @ 45 min utes to get dressed duet o generalized weakness.mod I with mobility with PFRW     Vision Baseline Vision/History: 1 Wears glasses       Perception         Praxis         Pertinent Vitals/Pain Pain Assessment Pain Assessment: No/denies pain     Extremity/Trunk Assessment         Cervical / Trunk Assessment Cervical / Trunk Assessment: Kyphotic   Communication Communication Communication: No apparent difficulties   Cognition Arousal: Alert Behavior During Therapy: WFL for tasks assessed/performed Cognition: No apparent impairments                               Following commands: Intact  Cueing  General Comments          Exercises Exercises: Hand exercises, General Upper Extremity, Other exercises General Exercises - Upper Extremity Digit Composite Flexion: Squeeze ball, Left (yellow - level 1) Other Exercises Other Exercises: general UB strengtheing with level 1 T band. Pt to complete seated using RUE   Shoulder Instructions      Home Living Family/patient expects to be discharged to:: Private residence Living Arrangements: Spouse/significant other Available Help at Discharge: Family;Other (Comment) (supervision only from wife) Type of Home: House Home Access: Stairs to enter [employer] of Steps: 3 Entrance Stairs-Rails: Right Home Layout: Two level Alternate Level Stairs-Number of Steps: has not been able to go upstairs for a little while (since March), sleeping in  recliner, has 15 steps Alternate Level Stairs-Rails: Left (bil first 4 steps) Bathroom Shower/Tub: Chief Strategy Officer: Standard     Home Equipment: Agricultural Consultant (2 wheels);Cane - single point;Grab bars - tub/shower;Hand held shower head          Prior Functioning/Environment Prior Level of Function : Independent/Modified Independent             Mobility Comments: no falls in past 6 months      OT Problem List: Decreased strength;Decreased range of motion;Decreased activity tolerance;Impaired balance (sitting and/or standing);Impaired UE functional use   OT Treatment/Interventions:        OT Goals(Current goals can be found in the care plan section)   Acute Rehab OT Goals Patient Stated Goal: home today OT Goal Formulation: All assessment and education complete, DC therapy   OT Frequency:       Co-evaluation              AM-PAC OT 6 Clicks Daily Activity     Outcome Measure Help from another person eating meals?: A Little Help from another person taking care of personal grooming?: A Little Help from another person toileting, which includes using toliet, bedpan, or urinal?: A Little Help from another person bathing (including washing, rinsing, drying)?: A Little Help from another person to put on and taking off regular upper body clothing?: A Little Help from another person to put on and taking off regular lower body clothing?: A Little 6 Click Score: 18   End of Session Nurse Communication: Other (comment) (DC needs)  Activity Tolerance: Patient tolerated treatment well Patient left: in bed;with call bell/phone within reach  OT Visit Diagnosis: Muscle weakness (generalized) (M62.81);Unsteadiness on feet (R26.81)                Time: 8476-8459 OT Time Calculation (min): 17 min Charges:  OT General Charges $OT Visit: 1 Visit OT Evaluation $OT Eval Low Complexity: 1 Low  Mario [name], OT/L   Acute OT Clinical Specialist Acute  Rehabilitation Services Pager [phone] Office [phone]   [facility] 12/11/2024, 3:50 PM

## 2024-12-11 NOTE — Progress Notes (Signed)
 Physical Therapy Treatment Patient Details Name: Mario [name] MRN: [id number] DOB: 1945-12-20 Today's Date: 12/11/2024   History of Present Illness Mario [name] is a 77 y.o. male admitted on 12/07/24 forL hand swelling and pain.  Pt s/pL hand I and D and sink bx to r/o inflammatory condition on 12/07/24.  Pt with significant PMH of asthma, A-fib, COPD, HTN.    PT Comments  Pt supine in bed on arrival.  After sitting up noticed L tricep and back of elbow weeping bloody and sersanguinous fluid which limited further tx.  Pt able to stand with CGA with elevated height of bed.  Required cues for hand placement to protect LUE.  Bandage on hand appears to be soiled.  Pt reports wife is caregiver at home for the last 3 months.  He continues to benefit from HHPT/OT and aide to maximize functional gains and decrease caregiver burden.  He will need RW with platform attachment before d/c home.     If plan is discharge home, recommend the following: A little help with bathing/dressing/bathroom;Assistance with cooking/housework;Assist for transportation;Help with stairs or ramp for entrance;A little help with walking and/or transfers   Can travel by private vehicle        Equipment Recommendations  Other (comment);Rolling walker (2 wheels) (will need L platform attachment for his RW)    Recommendations for Other Services       Precautions / Restrictions Precautions Precautions: Fall;Other (comment) Precaution/Restrictions Comments: reports wearing O2 at night only Restrictions Weight Bearing Restrictions Per Provider Order: No     Mobility  Bed Mobility Overal bed mobility: Needs Assistance Bed Mobility: Supine to Sit, Sit to Supine     Supine to sit: Min assist     General bed mobility comments: Min assistance for trunk elevation to scoot to edge of bed.    Transfers Overall transfer level: Needs assistance Equipment used: Left platform walker (reports he does not like this  device) Transfers: Sit to/from Stand Sit to Stand: Contact guard assist, From elevated surface (elevated surface height of his bed.)           General transfer comment: Min assist, heavy use of momentum to stand.  Pt says he has trouble getting out of certain chairs without arm rests.    Ambulation/Gait Ambulation/Gait assistance: Contact guard assist Gait Distance (Feet): 8 Feet (from bed to recliner across room.) Assistive device: Left platform walker Gait Pattern/deviations: Step-through pattern, Shuffle, Trunk flexed       General Gait Details: Pt with L tricep/elbow weeping blood and serosanguinous fluid which hindered further mobility.  RN in post transfer to recliner to place bandage on his arm.  He remains impulsive and quick to mobilize.   Stairs             Wheelchair Mobility     Tilt Bed    Modified Rankin (Stroke Patients Only)       Balance Overall balance assessment: Needs assistance   Sitting balance-Leahy Scale: Good       Standing balance-Leahy Scale: Fair                              Hotel Manager: No apparent difficulties  Cognition Arousal: Alert Behavior During Therapy: WFL for tasks assessed/performed   PT - Cognitive impairments: No apparent impairments  Following commands: Intact      Cueing    Exercises      General Comments        Pertinent Vitals/Pain Pain Assessment Pain Assessment: No/denies pain    Home Living                          Prior Function            PT Goals (current goals can now be found in the care plan section) Acute Rehab PT Goals Patient Stated Goal: To go home for christmas PT Goal Formulation: With patient Potential to Achieve Goals: Good Progress towards PT goals: Progressing toward goals    Frequency    Min 1X/week      PT Plan      Co-evaluation              AM-PAC PT 6  Clicks Mobility   Outcome Measure  Help needed turning from your back to your side while in a flat bed without using bedrails?: A Little Help needed moving from lying on your back to sitting on the side of a flat bed without using bedrails?: A Little Help needed moving to and from a bed to a chair (including a wheelchair)?: A Little Help needed standing up from a chair using your arms (e.g., wheelchair or bedside chair)?: A Little Help needed to walk in hospital room?: A Little Help needed climbing 3-5 steps with a railing? : A Lot 6 Click Score: 17    End of Session Equipment Utilized During Treatment: Gait belt Activity Tolerance: Patient limited by fatigue Patient left: with call bell/phone within reach;in chair;with chair alarm set Nurse Communication: Mobility status (need for bandage on L arm.) PT Visit Diagnosis: Muscle weakness (generalized) (M62.81);Difficulty in walking, not elsewhere classified (R26.2)     Time: 8871-8851 PT Time Calculation (min) (ACUTE ONLY): 20 min  Charges:    $Therapeutic Activity: 8-22 mins PT General Charges $$ ACUTE PT VISIT: 1 Visit                     Toya HAMS , PTA Acute Rehabilitation Services Office [phone]    Toya JINNY Gosling 12/11/2024, 11:55 AM

## 2024-12-11 NOTE — Progress Notes (Signed)
2D echo complete 

## 2024-12-11 NOTE — Progress Notes (Signed)
 PT SEEN/EXAMINED THIS AM DRESSING CHANGED HAND LOOKS SO MUCH BETTER NEW DRESSING APPLIED PT TOLERATED WELL OK TO GO HOME PO ABX AS PER ID F/U  IN MY OFFICE FOR WOUND CARE ON MONDAY  KEEP BANDAGE CLEAN AND DRY

## 2024-12-11 NOTE — Progress Notes (Signed)
 CULTURES REVIEWED, STAPH EPI PATH NECROTIZING INFLAMMATORY REACTION SUGGESTIVE OF INFECTION  WILL LOOK AT WOUNDS TODAY APPRECIATE ID INPUT  WILL BE BY LATER THIS AM TO SEE HOW HE IS RESPONDING TO ABX  COULD GO HOME IF OK WITH ID AND PLAN FOR CLOSE F/U AS OUTPATIENT IF TREATING INFECTION

## 2024-12-11 NOTE — TOC Transition Note (Signed)
 Transition of Care [facility]) - Discharge Note   Patient Details  Name: Mario [name] MRN: [id number] Date of Birth: 1945-12-26  Transition of Care [facility]) CM/SW Contact:  Roxie KANDICE Stain, RN Phone Number: 12/11/2024, 4:13 PM   Clinical Narrative:    Patient stable for discharge.  Notified Artavia with [facility] of discharge.  Follow up apt on AVS.   Final next level of care: Home w Home Health Services Barriers to Discharge: Barriers Resolved   Patient Goals and CMS Choice Patient states their goals for this hospitalization and ongoing recovery are:: to return home with [facility]          Discharge Placement               home        Discharge Plan and Services Additional resources added to the After Visit Summary for     Discharge Planning Services: CM Consult Post Acute Care Choice: Home Health, Durable Medical Equipment                    HH Arranged: PT, OT, Nurse's Aide HH Agency: [facility] Date [facility] Agency Contacted: 12/11/24 Time HH Agency Contacted: [phone] Representative spoke with at [facility] Agency: [facility]  Social Drivers of Health (SDOH) Interventions SDOH Screenings   Food Insecurity: Food Insecurity Present (12/07/2024)  Housing: High Risk (12/07/2024)  Transportation Needs: No Transportation Needs (12/07/2024)  Utilities: At Risk (12/07/2024)  Alcohol Screen: Low Risk (05/08/2024)  Depression (PHQ2-9): Low Risk (11/18/2024)  Financial Resource Strain: Medium Risk (10/19/2024)  Physical Activity: Inactive (10/19/2024)  Social Connections: Socially Integrated (12/07/2024)  Stress: No Stress Concern Present (10/19/2024)  Tobacco Use: Medium Risk (12/07/2024)  Health Literacy: Adequate Health Literacy (05/08/2024)     Readmission Risk Interventions    12/11/2024    4:13 PM 02/18/2024   10:38 AM  Readmission Risk Prevention Plan  Post Dischage Appt Complete Complete  Medication Screening Complete Complete  Transportation Screening  Complete Complete

## 2024-12-12 LAB — AEROBIC/ANAEROBIC CULTURE W GRAM STAIN (SURGICAL/DEEP WOUND)
Culture: NO GROWTH
Culture: NO GROWTH

## 2024-12-12 NOTE — Progress Notes (Addendum)
 "        [facility]  Date of Admission:  12/07/2024    Principal Problem:   Swelling of left hand          Assessment: 78 year old male with history of A-fib, CAD, COPD, bronchiectasis admitted with left hand wound.  Underwent debridement #Left hand/index finger inflammatory reaction with possible infection status post debridement on 12/07/2024 - Patient states he has had wounds open up on his hand since last March.  He had initial wound on his hand that worsened in March -.  He states he had an I&D on 3/26 with Dr. Ahmad for left small finger abscess.  He reports following debridement and wound care his wound closed.  About 2 weeks ago he started having another wound on his left hand which he thinks started after he closed the door on his hand.  Wound is painful.  Required I&D on 12/20 with purulence noted with hand surgery, culture showed normal skin flora.  Required another I&D on 12/20 with dehiscence tissue.  ID was called recommended biopsy, AFB fungal and bacterial cultures. - Spoke with patient and he has had a new wound on his right finger.  He states that that is how his wound starts.  Denies any prior history of wound starting in this manner. Recommendations:  - linezolid  + augmentin  till ID appt on 12/26/23 -Discussed plan with surgery and is significantly improved today.  - Staph epi is a, commensal skin organism, not driver of this clinical presentation.  AFB cultures are pending, smear negative, fungal stain negative.  Of note necrotizing abscess can be seen in pyoderma gangrenosum.SABRA  Recommend dermatology follow-up as infectious workup has been negative so far.  I suspect higher chance of this being inflammatory rather than infectious. - Per hand surgery therapist/wound care  needs followed at hand surgery clinic. - Plan communicated to primary.    Microbiology:   Antibiotics: Linezolid  and augmentin  Cultures: 12/20 aerobic/anaerobic culture 1  out of 3 staph epi, AFB smear 2 out of 2 negative, cultures pending fungal stain negative  SUBJECTIVE: Resting in bed.  Interval: Afebrile overnight  Review of Systems: Review of Systems  All other systems reviewed and are negative.    Scheduled Meds:   Continuous Infusions: PRN Meds:. Allergies[1]  OBJECTIVE: Vitals:   12/11/24 0500 12/11/24 0802 12/11/24 0940 12/11/24 1406  BP: 110/76 123/87  (!) 105/92  Pulse: 95 99  78  Resp:  17  17  Temp: 97.7 F (36.5 C) 97.7 F (36.5 C)  (!) 97.4 F (36.3 C)  TempSrc: Oral   Oral  SpO2: 99% 91% 96% 97%  Weight:      Height:       Body mass index is 29.37 kg/m.  Physical Exam Constitutional:      General: He is not in acute distress.    Appearance: He is normal weight. He is not toxic-appearing.  HENT:     Head: Normocephalic and atraumatic.     Right Ear: External ear normal.     Left Ear: External ear normal.     Nose: No congestion or rhinorrhea.     Mouth/Throat:     Mouth: Mucous membranes are moist.     Pharynx: Oropharynx is clear.  Eyes:     Extraocular Movements: Extraocular movements intact.     Conjunctiva/sclera: Conjunctivae normal.     Pupils: Pupils are equal, round, and reactive to light.  Cardiovascular:     Rate  and Rhythm: Normal rate and regular rhythm.     Heart sounds: No murmur heard.    No friction rub. No gallop.  Pulmonary:     Effort: Pulmonary effort is normal.     Breath sounds: Normal breath sounds.  Abdominal:     General: Abdomen is flat. Bowel sounds are normal.     Palpations: Abdomen is soft.  Musculoskeletal:     Cervical back: Normal range of motion and neck supple.     Comments: Left hand bandaged  Skin:    General: Skin is warm and dry.  Neurological:     General: No focal deficit present.     Mental Status: He is oriented to person, place, and time.  Psychiatric:        Mood and Affect: Mood normal.    Hand bandaged   Lab Results Lab Results  Component Value  Date   WBC 9.9 12/08/2024   HGB 12.8 (L) 12/08/2024   HCT 40.5 12/08/2024   MCV 95.7 12/08/2024   PLT 287 12/08/2024    Lab Results  Component Value Date   CREATININE 1.00 12/08/2024   BUN 25 (H) 12/08/2024   NA 137 12/08/2024   K 3.7 12/08/2024   CL 104 12/08/2024   CO2 24 12/08/2024    Lab Results  Component Value Date   ALT 57 (H) 05/07/2024   AST 37 (H) 05/07/2024   ALKPHOS 41 04/17/2024   BILITOT 0.6 05/07/2024        Loney Stank, MD [facility] 12/12/2024, 8:14 PM Evaluation of this patient requires complex antimicrobial therapy evaluation and counseling + isolation needs for disease transmission risk assessment and mitigation       [1]  Allergies Allergen Reactions   Dust Mite Extract Other (See Comments)    Asthma related   "

## 2024-12-13 ENCOUNTER — Telehealth: Payer: Self-pay | Admitting: *Deleted

## 2024-12-13 ENCOUNTER — Ambulatory Visit: Payer: Self-pay

## 2024-12-13 NOTE — Transitions of Care (Post Inpatient/ED Visit) (Signed)
 "  12/13/2024  Name: Mario [name] MRN: [id number] DOB: [birth year]  Today's TOC FU Call Status: Today's TOC FU Call Status:: Successful TOC FU Call Completed TOC FU Call Complete Date: 12/13/24  Patient's Name and Date of Birth confirmed. Name, DOB  Transition Care Management Follow-up Telephone Call Date of Discharge: 12/11/24 Discharge Facility: [facility]) Type of Discharge: Inpatient Admission Primary Inpatient Discharge Diagnosis:: Swelling of left hand How have you been since you were released from the hospital?: Worse (My left hand is fine, but I am so weak.) Any questions or concerns?: Yes Patient Questions/Concerns:: I am weak Patient Questions/Concerns Addressed: Other:  Items Reviewed: Did you receive and understand the discharge instructions provided?: Yes Medications obtained,verified, and reconciled?: Partial Review Completed Reason for Partial Mediation Review: Patient declined a complete medication review Any new allergies since your discharge?: No Dietary orders reviewed?: Yes Type of Diet Ordered:: Heart Healthy Do you have support at home?: Yes People in Home [RPT]: spouse Name of Support/Comfort Primary Source: Wife/Sharon  Medications Reviewed Today: Medications Reviewed Today     Reviewed by Lucky Andrea LABOR, RN (Registered Nurse) on 12/13/24 at 1111  Med List Status: <None>   Medication Order Taking? Sig Documenting Provider Last Dose Status Informant  acetaminophen  (TYLENOL ) 500 MG tablet [id number]  Take 1,000 mg by mouth every 6 (six) hours as needed for mild pain (pain score 1-3).  Patient taking differently: Take 1,000 mg by mouth every 8 (eight) hours as needed for mild pain (pain score 1-3).   [provider]  Active Self  alendronate  (FOSAMAX ) 70 MG tablet [id number]  Take 1 tablet (70 mg total) by mouth every 7 (seven) days. Take with a full glass of water  on an empty stomach.  Patient taking differently: Take 70 mg by mouth  every 7 (seven) days. Take with a full glass of water  on an empty stomach on Sundays   [name] DASEN, MD  Active Self  amoxicillin -clavulanate (AUGMENTIN ) 875-125 MG tablet [id number] Yes Take 1 tablet by mouth every 12 (twelve) hours for 14 days. Leotis Bogus, MD  Active   budesonide -formoterol  (SYMBICORT ) 160-4.5 MCG/ACT inhaler [id number]  Inhale 2 puffs into the lungs 2 (two) times daily. [provider]  Active Self  diltiazem  (DILACOR XR ) 240 MG 24 hr capsule [id number]  Take 1 capsule (240 mg total) by mouth daily. [name] DASEN, MD  Active Self  dorzolamide -timolol  (COSOPT ) 2-0.5 % ophthalmic solution [id number]  Place 1 drop into the right eye daily. [provider]  Active Self  ELIQUIS  5 MG TABS tablet [id number] Yes TAKE 1 TABLET BY MOUTH TWICE A DAY Court Dorn PARAS, MD  Active Self  FIBER COMPLETE PO [id number]  Take 1 tablet by mouth daily as needed (Constipation). [provider]  Active Self  furosemide  (LASIX ) 40 MG tablet [id number]  Take 1 tablet (40 mg total) by mouth daily. Take additional 20 mg if your weight is 3 lbs in 1 day of 5 lbs in 1 week  Patient not taking: Reported on 12/13/2024   [name] DASEN, MD  Active Self  guaiFENesin  (MUCINEX ) 600 MG 12 hr tablet [id number]  Take 1 tablet (600 mg total) by mouth 2 (two) times daily. Tobie Yetta HERO, MD  Active Self  Iron , Ferrous Sulfate , 325 (65 Fe) MG TABS [id number]  Take 325 mg by mouth daily. [name] DASEN, MD  Active Self  levalbuterol  (XOPENEX  HFA) 45 MCG/ACT inhaler [id number]  Inhale 2 puffs into the lungs  every 6 (six) hours as needed for wheezing. [name] DASEN, MD  Active Self  levalbuterol  (XOPENEX ) 0.63 MG/3ML nebulizer solution [id number]  Take 3 mLs (0.63 mg total) by nebulization 4 (four) times daily for 7 days, THEN 3 mLs (0.63 mg total) every 6 (six) hours as needed for wheezing or shortness of breath. [name] DASEN, MD  Expired 12/06/24 2359 Self  linezolid  (ZYVOX )  600 MG tablet [id number] Yes Take 1 tablet (600 mg total) by mouth every 12 (twelve) hours for 18 days. Leotis Bogus, MD  Active   loperamide (IMODIUM A-D) 2 MG tablet [id number] Yes Take 2 mg by mouth 4 (four) times daily as needed for diarrhea or loose stools. [provider]  Active   metoprolol  succinate (TOPROL -XL) 100 MG 24 hr tablet [id number]  Take 1 tablet (100 mg total) by mouth daily. Take with or immediately following a meal. Vicci Rollo SAUNDERS, PA-C  Active Self  montelukast  (SINGULAIR ) 10 MG tablet [id number]  TAKE 1 TABLET BY MOUTH EVERY DAY [name] DASEN, MD  Active Self  Multiple Vitamins-Minerals (MULTIVITAMIN WITH MINERALS) tablet [id number]  Take 1 tablet by mouth daily. [provider]  Active Self  pantoprazole  (PROTONIX ) 40 MG tablet [id number]  TAKE 1 TABLET BY MOUTH TWICE A DAY [name] DASEN, MD  Active Self  Respiratory Therapy Supplies (FLUTTER) DEVI [id number]  Use as directed Darlean Ozell NOVAK, MD  Active Self  rosuvastatin  (CRESTOR ) 20 MG tablet [id number]  TAKE 1 TABLET BY MOUTH EVERY DAY **D/C LIPITOR** [name] DASEN, MD  Active Self            Home Care and Equipment/Supplies: Were Home Health Services Ordered?: Yes Name of Home Health Agency:: [facility] Has Agency set up a time to come to your home?: Yes First Home Health Visit Date: 12/14/24 Any new equipment or medical supplies ordered?: No  Functional Questionnaire: Do you need assistance with bathing/showering or dressing?: Yes Do you need assistance with meal preparation?: Yes Do you need assistance with eating?: No Do you have difficulty maintaining continence: No Do you need assistance with getting out of bed/getting out of a chair/moving?: Yes Do you have difficulty managing or taking your medications?: No  Follow up appointments reviewed: Specialist Hospital Follow-up appointment confirmed?: Yes Date of Specialist follow-up appointment?: 12/25/24 Follow-Up Specialty  Provider:: [facility] Do you need transportation to your follow-up appointment?: No (Patient is unsure if he will be able to get to the car due to weakness. HH PT evaluation scheduled for tomorrow.) Do you understand care options if your condition(s) worsen?: Yes-patient verbalized understanding  SDOH Interventions Today    Flowsheet Row Most Recent Value  SDOH Interventions   Food Insecurity Interventions Intervention Not Indicated  Housing Interventions Intervention Not Indicated  Transportation Interventions Intervention Not Indicated  Utilities Interventions Intervention Not Indicated    Andrea Dimes RN, BSN [place]  [facility] RN Care Manager [phone]  "

## 2024-12-13 NOTE — Care Management Important Message (Signed)
 Important Message  Patient Details  Name: Mario [name] MRN: [id number] Date of Birth: 05-20-1946   Important Message Given:  No     [name] 12/13/2024, 10:34 AM

## 2024-12-13 NOTE — Progress Notes (Signed)
 Patient discharged, Important Message Letter mailed to patient.

## 2024-12-13 NOTE — Telephone Encounter (Signed)
 FYI Only or Action Required?: Action required by provider: referral request.  Patient was last seen in primary care on 11/18/2024 by [name] DASEN, MD.  Called Nurse Triage reporting Extremity Weakness.  Symptoms began several months ago.  Interventions attempted: Nothing.  Symptoms are: rapidly worsening.  Triage Disposition: See Physician Within 24 Hours  Patient/caregiver understands and will follow disposition?: No, wishes to speak with PCP   Copied from CRM #[id number]. Topic: Clinical - Red Word Triage >> Dec 13, 2024 10:45 AM Delon HERO wrote: Red Word that prompted transfer to Nurse Triage: Patient wife is reporting that the patient is extremely weak unable to stand and legs give out. Discharged from [facility] on 12/11/24. Wife is requesting palliative care. Patient can barely use the walker. Wife has to support him under the arm. Reason for Disposition  [1] MODERATE weakness (e.g., interferes with work, school, normal activities) AND [2] persists > 3 days  Answer Assessment - Initial Assessment Questions 1. DESCRIPTION: Describe how you are feeling.      Unable to move on his own, stand/walk  2. SEVERITY: How bad is it?  Can you stand and walk?      Unable to walk to bathroom, can't get out of seat.  3. ONSET: When did these symptoms begin? (e.g., hours, days, weeks, months)      X Months, worsening   4. NEW MEDICINES:  Have you started on any new medicines recently? (e.g., opioid pain medicines, benzodiazepines, muscle relaxants, antidepressants, antihistamines, neuroleptics, beta blockers)      antibiotics  5. OTHER SYMPTOMS: Pt's wife denies chest pain, fever, cough, vomiting, bleeding, other areas of pain       Wife reports SOB and diarrhea   Pt's wife reports Weakness/Fatigue Offered hospital follow up visit for pt to pt's wife who states patient will not be able to make it into the clinic. Pt's wife is requesting palliative care for pt and states symptoms  have been going on for months, however, worsening. Pt's wife states pt is unable to do anything on his own, use the bathroom, or even stand from his chair without the help of her. Routing to clinic to assist with pt's wife's concerns and request Pt agrees with plan of care, will call back for any worsening symptoms  Protocols used: Weakness (Generalized) and Fatigue-A-AH

## 2024-12-16 ENCOUNTER — Other Ambulatory Visit

## 2024-12-16 ENCOUNTER — Other Ambulatory Visit: Payer: Self-pay

## 2024-12-16 ENCOUNTER — Ambulatory Visit: Payer: Self-pay

## 2024-12-16 DIAGNOSIS — J189 Pneumonia, unspecified organism: Secondary | ICD-10-CM

## 2024-12-16 DIAGNOSIS — J4550 Severe persistent asthma, uncomplicated: Secondary | ICD-10-CM

## 2024-12-16 DIAGNOSIS — M25562 Pain in left knee: Secondary | ICD-10-CM

## 2024-12-16 DIAGNOSIS — J471 Bronchiectasis with (acute) exacerbation: Secondary | ICD-10-CM

## 2024-12-16 DIAGNOSIS — J9691 Respiratory failure, unspecified with hypoxia: Secondary | ICD-10-CM

## 2024-12-16 DIAGNOSIS — J4489 Other specified chronic obstructive pulmonary disease: Secondary | ICD-10-CM

## 2024-12-16 NOTE — Telephone Encounter (Signed)
 FYI Only or Action Required?: Action required by provider: request for appointment, referral request, and clinical question for provider.  Patient was last seen in primary care on 11/18/2024 by [name] DASEN, MD.  Called Nurse Triage reporting Diarrhea.  Symptoms began a week ago.  Interventions attempted: Nothing.  Symptoms are: unchanged.  Triage Disposition: See Physician Within 24 Hours  Patient/caregiver understands and will follow disposition?: No, wishes to speak with PCP   Copied from CRM #[id number]. Topic: Clinical - Red Word Triage >> Dec 16, 2024  9:15 AM Mario [name] wrote: Red Word that prompted transfer to Nurse Triage: diarrhea for 12 days now, also fell down stairs, feeling really weak, needing assistance Reason for Disposition  [1] MODERATE diarrhea (e.g., 4-6 times / day more than normal) AND [2] age > 70 years  Answer Assessment - Initial Assessment Questions No available appts with pcp today. Patient requesting telephone or video visit with Dr. Duanne only, declines alt provider or in person visits,UC/ED.  Patient is requesting call back and referral for Home Health.  Advised 911 if symptoms worsen: diff breathing , chest pain >5 min, faint Patient verbalized understanding.  1. DIARRHEA SEVERITY: How bad is the diarrhea? How many more stools have you had in the past 24 hours than normal?      3x 2. ONSET: When did the diarrhea begin?      10 days ago; during hospitalization; d/c 12/11/24 3. STOOL DESCRIPTION:  How loose or watery is the diarrhea? What is the stool color? Is there any blood or mucous in the stool?     24 hours: loose stools, dark brown, 3x  denies blood, mucous 4. VOMITING: Are you also vomiting? If Yes, ask: How many times in the past 24 hours?      no 5. ABDOMEN PAIN: Are you having any abdomen pain? If Yes, ask: What does it feel like? (e.g., crampy, dull, intermittent, constant)      no 6. ABDOMEN PAIN SEVERITY: If  present, ask: How bad is the pain?  (e.g., Scale 1-10; mild, moderate, or severe)     no 7. ORAL INTAKE: If vomiting, Have you been able to drink liquids? How much liquids have you had in the past 24 hours?     Able to eat and drink 8. HYDRATION: Any signs of dehydration? (e.g., dry mouth [not just dry lips], too weak to stand, dizziness, new weight loss) When did you last urinate? Patient reports weakness denies dizziness, faint,dry mouth Urinating as normal      9. EXPOSURE: Have you traveled to a foreign country recently? Have you been exposed to anyone with diarrhea? Could you have eaten any food that was spoiled?     no 10. ANTIBIOTIC USE: Are you taking antibiotics now or have you taken antibiotics in the past 2 months?       Anitbitiocs during hospitalization, d/c 12/11/24. Patient reports fell 12/11/24, bruise ribs, denies head injury or diff breathing 11. OTHER SYMPTOMS: Do you have any other symptoms? (e.g., fever, blood in stool)  Denies diff breathing, chest pain, fever chills, n/v  Protocols used: Diarrhea-A-AH

## 2024-12-16 NOTE — Telephone Encounter (Signed)
 Called disconnected prior to warm transfer. Nurse will attempt to contact patient.

## 2024-12-17 ENCOUNTER — Encounter (HOSPITAL_COMMUNITY)

## 2024-12-17 ENCOUNTER — Other Ambulatory Visit

## 2024-12-18 ENCOUNTER — Ambulatory Visit (HOSPITAL_COMMUNITY): Admitting: Physician Assistant

## 2024-12-18 LAB — C. DIFFICILE GDH AND TOXIN A/B
GDH ANTIGEN: NOT DETECTED
MICRO NUMBER:: 17410085
SPECIMEN QUALITY:: ADEQUATE
TOXIN A AND B: NOT DETECTED

## 2024-12-19 ENCOUNTER — Ambulatory Visit: Payer: Self-pay | Admitting: Pulmonary Disease

## 2024-12-19 NOTE — Progress Notes (Signed)
 Patient spent 1hr 4 mins with an SpO2 less than 88%. He qualifies for nocturnal oxygen. Recommend using 2L O2 with humidification. Please send in DME orders if he is ok with starting therapy.

## 2024-12-20 ENCOUNTER — Other Ambulatory Visit: Payer: Self-pay | Admitting: Family Medicine

## 2024-12-20 ENCOUNTER — Ambulatory Visit: Payer: Self-pay | Admitting: Family Medicine

## 2024-12-20 ENCOUNTER — Telehealth: Payer: Self-pay

## 2024-12-20 ENCOUNTER — Ambulatory Visit: Admitting: Pulmonary Disease

## 2024-12-20 ENCOUNTER — Inpatient Hospital Stay: Admitting: Family Medicine

## 2024-12-20 MED ORDER — DIPHENOXYLATE-ATROPINE 2.5-0.025 MG PO TABS: 2.0000 | ORAL_TABLET | Freq: Four times a day (QID) | ORAL | 0 refills | Status: AC | PRN

## 2024-12-20 NOTE — Telephone Encounter (Signed)
 Copied from CRM [id number]. Topic: General - Call Back - No Documentation >> Dec 18, 2024 12:47 PM Charlet HERO wrote: Reason for CRM: Patient returning call to Ronal Bradley informed the patient that the office closed at 12 today please call patient back for further asst.

## 2024-12-20 NOTE — Telephone Encounter (Signed)
 Spoke with patient tried to relay results patient could not understand will explain further at next OV.

## 2024-12-23 ENCOUNTER — Ambulatory Visit (HOSPITAL_COMMUNITY): Admitting: Physician Assistant

## 2024-12-24 ENCOUNTER — Inpatient Hospital Stay: Admitting: Family Medicine

## 2024-12-24 ENCOUNTER — Encounter: Payer: Self-pay | Admitting: Family Medicine

## 2024-12-24 ENCOUNTER — Telehealth (INDEPENDENT_AMBULATORY_CARE_PROVIDER_SITE_OTHER): Admitting: Family Medicine

## 2024-12-24 ENCOUNTER — Encounter (HOSPITAL_COMMUNITY)

## 2024-12-24 DIAGNOSIS — I480 Paroxysmal atrial fibrillation: Secondary | ICD-10-CM

## 2024-12-24 DIAGNOSIS — R300 Dysuria: Secondary | ICD-10-CM

## 2024-12-24 DIAGNOSIS — J4489 Other specified chronic obstructive pulmonary disease: Secondary | ICD-10-CM | POA: Diagnosis not present

## 2024-12-24 DIAGNOSIS — R5381 Other malaise: Secondary | ICD-10-CM | POA: Diagnosis not present

## 2024-12-24 MED ORDER — SULFAMETHOXAZOLE-TRIMETHOPRIM 800-160 MG PO TABS: 1.0000 | ORAL_TABLET | Freq: Two times a day (BID) | ORAL | 0 refills | Status: DC

## 2024-12-24 NOTE — Progress Notes (Signed)
 "  Subjective:    Patient ID: Mario [name], male    DOB: [birth year], 79 y.o.   MRN: [id number]  HPI Patient is being seen today as a video visit.  Patient is currently at home.  I am currently at my office.  He consents to be seen via video.  Video call began at 252.  Video call concluded at 326.  I have copied relevant portions of his discharge summary below for my reference. Admit date: 12/07/2024   Discharge date: 12/11/2024   Admitted From: Home.   Disposition:  Home Health Services   Recommendations for Outpatient Follow-up:  Follow up with PCP in 1-2 weeks. Please obtain BMP/CBC in one week. Advised to follow-up with orthopedics as scheduled. Advised to take Augmentin  and linezolid  until follow up with ID on 12/26/23. Follow-up ID as scheduled on 12/25/2024.   Home Health: Home Health Services Equipment/Devices:None   Discharge Condition: Stable CODE STATUS:Full code Diet recommendation: Heart Healthy    Brief [facility] Course: This 79 y.o. male with PMH significant for hypertension, hyperlipidemia, persistent atrial fibrillation, bronchiectasis, and OSA no longer on CPAP who presents with Left hand swelling. He has been experiencing hand swelling for the past couple of weeks without recalling any specific inciting event such as an insect bite. A similar episode occurred earlier this year, which resolved after surgical intervention. He is on Eliquis  for atrial fibrillation and has not taken his blood pressure medication this morning. He denies the use of CPAP at night and has no history of diabetes. He has been on doxycycline  for the last week.  Patient was admitted under orthopedics.  Patient underwent debridement which did not reveal any purulent drainage.  Biopsy was taken.  Patient continued on empiric antibiotics ( linezolid  and Augmentin  ) as recommended by ID.  Patient had dressing changed.  Hand looked so much better.  New dressing applied.  Patient cleared from  orthopedics to be discharged home on p.o. antibiotics.  Follow-up in orthopedic office on Monday.  Patient is discharged on linezolid  and Augmentin  twice a day until follow-up with ID on 12/25/2024.   Discharge Diagnoses:  Principal Problem:   Swelling of left hand   Left hand swelling: Patient presented with left hand swelling for 2 weeks.   He had been on doxycycline  over the last week without improvement in symptoms.   Patient was taken to the operating room for debridement which did not reveal any purulent drainage and biopsy was done.   Cultures were sent, but prior cultures did not show any growth.  Possibility of infectious process versus inflammatory. ESR (44) and CRP (12.4) Continue empiric antibiotics (linezolid  and Augmentin ) as recommended by ID. Await biopsy.  ANA and hepatitis panel  negative. Likely inflammatory. Wound care per Hand surgery. Patient cleared from Dr. Ortman for discharge on oral antibiotics. ID recommended continuing Augmentin  and linezolid  twice a day until follow-up with ID on January 7/26.   Leukocytosis: Leukocytosis likely secondary to above. Resolved.   Paroxysmal atrial fibrillation: Noted to be in atrial fibrillation currently.  Patient took Eliquis  2 days ago. Continue metoprolol  100 mg daily. Continue Cardizem  CD 240 mg daily. Continue to hold Eliquis  due to open wound.   Eliquis  resumed.   COPD: Patient without significant wheezing or rhonchi appreciated at this time. Continue breathing treatments as needed.   Diarrhea: Patient reports having loose watery stools since yesterday, He has multiple episodes associated with cramps.  GI panel negative.  Diarrhea improved.  12/24/24 Patient states that he discontinued the linezolid  and the Augmentin  on Christmas due to severe diarrhea.  Therefore he has been off his antibiotics now for almost 2 weeks.  However he states that the hand wound looks almost normal.  He denies any erythema.  He denies  any warmth or pain.  He denies any fluctuance or swelling.  Patient states that his breathing is near his baseline.  He does report congestion however he states that this is no different than his normal baseline.  His biggest concern is profound muscular deconditioning.  He is essentially confined to a chair.  He is unable to stand.  The last time he tried to go to a toilet, the patient was so weak that he fell and had to call EMS to help him get back up.  Therefore he is currently in a lift chair which he uses to stand up and transition to the toilet.  He is not performing any physical therapy or exercises at the present time as his legs have grown so weak.  He denies any chest pain or shortness of breath.  He denies any lightheadedness or syncope.  He has not been checking his blood pressure to see if he is hypotensive.  He does state that the diarrhea has subsided.  He has not had to take the Lomotil  that I called in.  I did check the patient for C. difficile which was negative.  Stool cultures in the hospital were also negative.  Patient states that over the last few days, he has developed dysuria and urgency.  He denies any fever or chills or back pain Past Medical History:  Diagnosis Date   Arrhythmia    Arthritis    Asthma    Atrial fibrillation (HCC)    Biliary colic 2014   Bronchiectasis (HCC)    2021   Bronchiectasis (HCC) 11/18/2024   Cataract    COPD (chronic obstructive pulmonary disease) with chronic bronchitis (HCC)    Coronary artery calcification seen on CAT scan    Cyst of pancreas    1 cm (7/18), repeat ct in 12 months.     Hyperlipidemia    Hypertension    Obstructive sleep apnea    Pneumonia 10/24/2024   left lower lobe   Renal disorder    Rhinitis    Sleep apnea    pt denies sleep apnea-lost weight   Past Surgical History:  Procedure Laterality Date   ATRIAL FIBRILLATION ABLATION N/A 09/19/2024   Procedure: ATRIAL FIBRILLATION ABLATION;  Surgeon: Nancey Eulas BRAVO,  MD;  Location: [facility] INVASIVE CV [facility];  Service: Cardiovascular;  Laterality: N/A;   CARDIOVERSION N/A 05/14/2024   Procedure: CARDIOVERSION;  Surgeon: Michele Richardson, DO;  Location: [facility];  Service: Cardiovascular;  Laterality: N/A;   CATARACT EXTRACTION Right    CHOLECYSTECTOMY     COLONOSCOPY  09/29/2014   INCISION AND DRAINAGE OF DEEP ABSCESS, HAND Left 11/29/2024   Procedure: INCISION AND DRAINAGE OF DEEP ABSCESS, HAND;  Surgeon: Shari Easter, MD;  Location: [facility] OR;  Service: Orthopedics;  Laterality: Left;   INCISION AND DRAINAGE OF DEEP ABSCESS, HAND Left 12/07/2024   Procedure: INCISION AND DRAINAGE OF DEEP ABSCESS, HAND;  Surgeon: Shari Easter, MD;  Location: [facility] OR;  Service: Orthopedics;  Laterality: Left;   INCISION AND DRAINAGE OF WOUND Left 03/13/2024   Procedure: IRRIGATION AND DEBRIDEMENT WOUND;  Surgeon: Shari Easter, MD;  Location: [facility];  Service: Orthopedics;  Laterality: Left;  IRRIGATION AND  DEBRIDEMENT FIFTH FINGER LEFT   IR RADIOLOGIST EVAL & MGMT  09/12/2024   LAPAROSCOPIC CHOLECYSTECTOMY SINGLE SITE WITH INTRAOPERATIVE CHOLANGIOGRAM N/A 09/15/2019   Procedure: LAPAROSCOPIC CHOLECYSTECTOMY SINGLE SITE;  Surgeon: Sheldon Standing, MD;  Location: [facility] ORS;  Service: General;  Laterality: N/A;   LITHOTRIPSY  2000   for kidney stone   Medications Ordered Prior to Encounter[1] Allergies[2] Social History   Socioeconomic History   Marital status: Married    Spouse name: Not on file   Number of children: Not on file   Years of education: Not on file   Highest education level: Bachelor's degree (e.g., BA, AB, BS)  Occupational History   Not on file  Tobacco Use   Smoking status: Former    Current packs/day: 0.00    Average packs/day: 2.0 packs/day for 11.0 years (22.0 ttl pk-yrs)    Types: Cigarettes    Start date: 12/20/1963    Quit date: 12/19/1974    Years since quitting: 50.0   Smokeless tobacco: Never  Vaping Use   Vaping status: Never Used  Substance and  Sexual Activity   Alcohol use: Not Currently    Alcohol/week: 1.0 standard drink of alcohol   Drug use: No   Sexual activity: Not Currently  Other Topics Concern   Not on file  Social History Narrative   Not on file   Social Drivers of Health   Tobacco Use: Medium Risk (12/07/2024)   Patient History    Smoking Tobacco Use: Former    Smokeless Tobacco Use: Never    Passive Exposure: Not on file  Financial Resource Strain: Medium Risk (10/19/2024)   Overall Financial Resource Strain (CARDIA)    Difficulty of Paying Living Expenses: Somewhat hard  Food Insecurity: No Food Insecurity (12/13/2024)   Epic    Worried About Programme Researcher, Broadcasting/film/video in the Last Year: Never true    Ran Out of Food in the Last Year: Never true  Recent Concern: Food Insecurity - Food Insecurity Present (12/07/2024)   Epic    Worried About Programme Researcher, Broadcasting/film/video in the Last Year: Sometimes true    Ran Out of Food in the Last Year: Sometimes true  Transportation Needs: No Transportation Needs (12/13/2024)   Epic    Lack of Transportation (Medical): No    Lack of Transportation (Non-Medical): No  Physical Activity: Inactive (10/19/2024)   Exercise Vital Sign    Days of Exercise per Week: 0 days    Minutes of Exercise per Session: Not on file  Stress: No Stress Concern Present (10/19/2024)   [employer] of Occupational Health - Occupational Stress Questionnaire    Feeling of Stress: Only a little  Social Connections: Socially Integrated (12/07/2024)   Social Connection and Isolation Panel    Frequency of Communication with Friends and Family: More than three times a week    Frequency of Social Gatherings with Friends and Family: More than three times a week    Attends Religious Services: More than 4 times per year    Active Member of Clubs or Organizations: Yes    Attends Banker Meetings: 1 to 4 times per year    Marital Status: Married  Catering Manager Violence: Not At Risk (12/13/2024)    Epic    Fear of Current or Ex-Partner: No    Emotionally Abused: No    Physically Abused: No    Sexually Abused: No  Depression (PHQ2-9): Low Risk (12/13/2024)   Depression (PHQ2-9)    PHQ-2  Score: 0  Alcohol Screen: Low Risk (05/08/2024)   Alcohol Screen    Last Alcohol Screening Score (AUDIT): 0  Housing: Unknown (12/13/2024)   Epic    Unable to Pay for Housing in the Last Year: No    Number of Times Moved in the Last Year: Not on file    Homeless in the Last Year: No  Recent Concern: Housing - High Risk (12/07/2024)   Epic    Unable to Pay for Housing in the Last Year: Yes    Number of Times Moved in the Last Year: 0    Homeless in the Last Year: No  Utilities: Not At Risk (12/13/2024)   Epic    Threatened with loss of utilities: No  Recent Concern: Utilities - At Risk (12/07/2024)   Epic    Threatened with loss of utilities: Yes  Health Literacy: Adequate Health Literacy (05/08/2024)   B1300 Health Literacy    Frequency of need for help with medical instructions: Never      Review of Systems  All other systems reviewed and are negative.      Objective:   Physical Exam        Assessment & Plan:  COPD (chronic obstructive pulmonary disease) with chronic bronchitis (HCC)  Paroxysmal atrial fibrillation (HCC)  Physical deconditioning  Dysuria Biggest issue seems to be deconditioning.  Due to the patient's poor health, recurrent COPD exacerbations, recent admission for hand surgery, he has developed profound deconditioning.  He is unable to stand or walk.  He is essentially confined to a chair.  I have ordered home health physical therapy however the patient has not yet met with them.  He is going to call today to schedule an evaluation.  First I recommended trying to do any type of exercise he can do to build up the strength in his legs.  I recommended a foot pedal machine which would help build up his leg strength.  Also recommended performing knee extension  exercises while seated using shoes and boots his weights.  He can perform these exercises throughout the day is much as tolerated.  I have also recommended trying to do stand up and sit down exercises out of his power chair using a walker to hold onto.  They could gradually lower the lift chair and have him stand up from progressively lower heights to use more of his muscle strength in his legs until eventually he is standing up from a seated position.  I believe these exercises will help build up his leg strength until he can hopefully walk with physical therapy.  If not, he may need short-term admission for rehab.  I also recommended that he check his blood pressure and if his systolic blood pressures less than 100 I would recommend decreasing the dose of metoprolol  to avoid hypotension.  His COPD seems stable.  I recommended treating his dysuria with Bactrim  double strength tablets twice daily for 7 days.  Spent 30 minutes in conference with the patient's     [1]  Current Outpatient Medications on File Prior to Visit  Medication Sig Dispense Refill   acetaminophen  (TYLENOL ) 500 MG tablet Take 1,000 mg by mouth every 6 (six) hours as needed for mild pain (pain score 1-3). (Patient taking differently: Take 1,000 mg by mouth every 8 (eight) hours as needed for mild pain (pain score 1-3).)     alendronate  (FOSAMAX ) 70 MG tablet Take 1 tablet (70 mg total) by mouth every 7 (seven) days. Take  with a full glass of water  on an empty stomach. (Patient taking differently: Take 70 mg by mouth every 7 (seven) days. Take with a full glass of water  on an empty stomach on Sundays) 4 tablet 11   budesonide -formoterol  (SYMBICORT ) 160-4.5 MCG/ACT inhaler Inhale 2 puffs into the lungs 2 (two) times daily.     diltiazem  (DILACOR XR ) 240 MG 24 hr capsule Take 1 capsule (240 mg total) by mouth daily. 90 capsule 1   diphenoxylate -atropine  (LOMOTIL ) 2.5-0.025 MG tablet Take 2 tablets by mouth 4 (four) times daily as needed  for diarrhea or loose stools. 30 tablet 0   dorzolamide -timolol  (COSOPT ) 2-0.5 % ophthalmic solution Place 1 drop into the right eye daily.     ELIQUIS  5 MG TABS tablet TAKE 1 TABLET BY MOUTH TWICE A DAY 60 tablet 5   FIBER COMPLETE PO Take 1 tablet by mouth daily as needed (Constipation).     furosemide  (LASIX ) 40 MG tablet Take 1 tablet (40 mg total) by mouth daily. Take additional 20 mg if your weight is 3 lbs in 1 day of 5 lbs in 1 week (Patient not taking: Reported on 12/13/2024) 30 tablet 3   guaiFENesin  (MUCINEX ) 600 MG 12 hr tablet Take 1 tablet (600 mg total) by mouth 2 (two) times daily. 60 tablet 0   Iron , Ferrous Sulfate , 325 (65 Fe) MG TABS Take 325 mg by mouth daily. 30 tablet 11   levalbuterol  (XOPENEX  HFA) 45 MCG/ACT inhaler Inhale 2 puffs into the lungs every 6 (six) hours as needed for wheezing. 1 each 12   levalbuterol  (XOPENEX ) 0.63 MG/3ML nebulizer solution Take 3 mLs (0.63 mg total) by nebulization 4 (four) times daily for 7 days, THEN 3 mLs (0.63 mg total) every 6 (six) hours as needed for wheezing or shortness of breath. 3 mL 3   loperamide (IMODIUM A-D) 2 MG tablet Take 2 mg by mouth 4 (four) times daily as needed for diarrhea or loose stools.     metoprolol  succinate (TOPROL -XL) 100 MG 24 hr tablet Take 1 tablet (100 mg total) by mouth daily. Take with or immediately following a meal. 90 tablet 3   montelukast  (SINGULAIR ) 10 MG tablet TAKE 1 TABLET BY MOUTH EVERY DAY 90 tablet 2   Multiple Vitamins-Minerals (MULTIVITAMIN WITH MINERALS) tablet Take 1 tablet by mouth daily.     pantoprazole  (PROTONIX ) 40 MG tablet TAKE 1 TABLET BY MOUTH TWICE A DAY 180 tablet 2   Respiratory Therapy Supplies (FLUTTER) DEVI Use as directed 1 each 0   rosuvastatin  (CRESTOR ) 20 MG tablet TAKE 1 TABLET BY MOUTH EVERY DAY **D/C LIPITOR** 90 tablet 2   No current facility-administered medications on file prior to visit.  [2]  Allergies Allergen Reactions   Dust Mite Extract Other (See Comments)     Asthma related   "

## 2024-12-25 ENCOUNTER — Encounter: Payer: Self-pay | Admitting: Cardiovascular Disease

## 2024-12-25 ENCOUNTER — Inpatient Hospital Stay: Payer: Self-pay | Admitting: Internal Medicine

## 2024-12-26 ENCOUNTER — Ambulatory Visit: Admitting: "Endocrinology

## 2024-12-26 ENCOUNTER — Encounter (HOSPITAL_COMMUNITY)

## 2024-12-27 ENCOUNTER — Telehealth: Payer: Self-pay

## 2024-12-27 MED ORDER — CIPROFLOXACIN HCL 500 MG PO TABS: 500.0000 mg | ORAL_TABLET | Freq: Two times a day (BID) | ORAL | 0 refills | Status: AC

## 2024-12-27 NOTE — Telephone Encounter (Signed)
 Copied from CRM #[id number]. Topic: Clinical - Medication Prior Auth >> Dec 27, 2024 11:07 AM Zebedee SAUNDERS wrote: Reason for CRM: Received call from [facility] per [place] ph:[phone] opt. 1 regarding pt's Oxygen Concentrator and the prior authorization that was needed.

## 2024-12-30 ENCOUNTER — Telehealth: Payer: Self-pay

## 2024-12-30 NOTE — Telephone Encounter (Signed)
 Copied from CRM #[id number]. Topic: Clinical - Medical Advice >> Dec 27, 2024  4:40 PM Nathanel BROCKS wrote: Reason for CRM: Received call from [facility] per [place] [phone] opt. 1 regarding pt's Oxygen Concentrator and the prior authorization that was needed.

## 2024-12-31 ENCOUNTER — Encounter (HOSPITAL_COMMUNITY)

## 2025-01-01 ENCOUNTER — Other Ambulatory Visit: Payer: Self-pay

## 2025-01-01 ENCOUNTER — Other Ambulatory Visit

## 2025-01-01 ENCOUNTER — Ambulatory Visit: Admitting: Family Medicine

## 2025-01-01 ENCOUNTER — Telehealth: Payer: Self-pay

## 2025-01-01 DIAGNOSIS — I480 Paroxysmal atrial fibrillation: Secondary | ICD-10-CM

## 2025-01-01 DIAGNOSIS — J4489 Other specified chronic obstructive pulmonary disease: Secondary | ICD-10-CM

## 2025-01-01 NOTE — Telephone Encounter (Signed)
 Copied from CRM #[id number]. Topic: Clinical - Medical Advice >> Dec 27, 2024  4:40 PM Nathanel BROCKS wrote: Reason for CRM: Received call from [facility] per [place] ph:[phone] opt. 1 regarding pt's Oxygen Concentrator and the prior authorization that was needed. >> Dec 31, 2024 10:29 AM Deleta RAMAN wrote: Tobias is following up regarding request please contact her at [phone] opt. 1 >> Dec 30, 2024  3:01 PM Winona R wrote: Tobias calling to follow up on any updates  >> Dec 30, 2024 10:41 AM Winona SAUNDERS wrote:  Tobias calling from [facility] to follow up on Prior Auth status for Pts Oxygen Concentrator. Ive infored her the clinical time is looking into this for the pt and will follow up when additional information is avail. Tobias stated she will call back tomorrow for a follow up

## 2025-01-02 ENCOUNTER — Other Ambulatory Visit

## 2025-01-02 ENCOUNTER — Encounter (HOSPITAL_COMMUNITY)

## 2025-01-02 LAB — URINALYSIS, ROUTINE W REFLEX MICROSCOPIC
Bacteria, UA: NONE SEEN /HPF
Bilirubin Urine: NEGATIVE
Glucose, UA: NEGATIVE
Hyaline Cast: NONE SEEN /LPF
Ketones, ur: NEGATIVE
Leukocytes,Ua: NEGATIVE
Nitrite: NEGATIVE
Protein, ur: NEGATIVE
Specific Gravity, Urine: 1.02 (ref 1.001–1.035)
Squamous Epithelial / HPF: NONE SEEN /HPF
WBC, UA: NONE SEEN /HPF (ref 0–5)
pH: 5.5 (ref 5.0–8.0)

## 2025-01-02 LAB — MICROSCOPIC MESSAGE

## 2025-01-03 ENCOUNTER — Other Ambulatory Visit: Payer: Self-pay | Admitting: Family Medicine

## 2025-01-03 ENCOUNTER — Other Ambulatory Visit: Payer: Self-pay

## 2025-01-03 ENCOUNTER — Ambulatory Visit: Payer: Self-pay | Admitting: Family Medicine

## 2025-01-03 DIAGNOSIS — R3129 Other microscopic hematuria: Secondary | ICD-10-CM

## 2025-01-03 LAB — URINE CULTURE
MICRO NUMBER:: 17473667
Result:: NO GROWTH
SPECIMEN QUALITY:: ADEQUATE

## 2025-01-03 MED ORDER — CEPHALEXIN 500 MG PO CAPS: 500.0000 mg | ORAL_CAPSULE | Freq: Three times a day (TID) | ORAL | 0 refills | Status: DC

## 2025-01-03 MED ORDER — TAMSULOSIN HCL 0.4 MG PO CAPS: 0.4000 mg | ORAL_CAPSULE | Freq: Every day | ORAL | 3 refills | Status: AC

## 2025-01-07 ENCOUNTER — Telehealth: Payer: Self-pay

## 2025-01-07 ENCOUNTER — Encounter (HOSPITAL_COMMUNITY)

## 2025-01-07 ENCOUNTER — Ambulatory Visit (HOSPITAL_COMMUNITY)
Admission: RE | Admit: 2025-01-07 | Discharge: 2025-01-07 | Disposition: A | Discharge status: Home / Self Care | Source: Ambulatory Visit | Attending: Physician Assistant | Admitting: Physician Assistant

## 2025-01-07 VITALS — HR 86 | Ht 68.5 in

## 2025-01-07 DIAGNOSIS — D6869 Other thrombophilia: Secondary | ICD-10-CM

## 2025-01-07 DIAGNOSIS — I4819 Other persistent atrial fibrillation: Secondary | ICD-10-CM | POA: Diagnosis not present

## 2025-01-07 NOTE — Progress Notes (Signed)
 "    Electrophysiology TeleHealth Note  Audio/video telehealth visit is felt to be most appropriate for this patient at this time.  See consent below from today for patient consent regarding telehealth for the At[facility] Fibrillation Clinic.    Date:  01/07/2025   ID:  Mario [name], DOB 1946-05-23, MRN [id number]  Location: home   Provider location:  [facility] [address] [place], [place] [address] Evaluation Performed: Follow up  PCP:  [name] DASEN, MD  Primary Cardiologist: Dorn Lesches, MD Electrophysiologist: Eulas FORBES Furbish, MD  Referring Physician: Dr Furbish    History of Present Illness: Mario [name] is a 79 y.o. male who presents via audio/video conferencing for a telehealth visit today. Mario [name] is a 79 y.o. male with a history of CAD, OSA, HTN, HLD, COPD, asthma, atrial fibrillation who presents for follow up in the [facility]. He was diagnosed with atrial fibrillation in 2019.  Prior to this he experience episodes of palpitations on and off for 2 or so years.  A monitor was placed that did document atrial fibrillation. He was admitted with atrial fibrillation and RVR in February 2024.  He was discharged with metoprolol  and taken off albuterol .  He was again admitted in April and May 2025 with atrial fibrillation RVR in the setting of COPD exacerbation.   He underwent DC cardioversion on May 14, 2024.  The patient believes that he converted back to atrial fibrillation the day following his cardioversion. Patient is on Eliquis  for stroke prevention. He was seen by Dr Furbish and underwent afib ablation on 09/19/24. He believes he went back into afib about two days after his ablation. He was set up for DCCV but this was postponed as he was being treated for pneumonia and bronchiectasis. He was hospitalized again 11/2024 with left hand swelling. Patient was taken to the operating room for debridement which did not reveal any purulent  drainage and biopsy was done. Cultures were sent, but prior cultures did not show any growth. Suspected inflammatory process.   Patient returns for follow up for atrial fibrillation. He reports that he feels well from a cardiac standpoint. He also reports his hand is much better. However, with his recent illnesses and hospitalizations he has become severely deconditioned. He is working with his PCP and PT to try and regain some strength in his legs. No bleeding issues on anticoagulation.   Today, he  denies symptoms of palpitations, chest pain, orthopnea, PND, lower extremity edema, dizziness, presyncope, syncope, bleeding, or neurologic sequela. The patient is tolerating medications without difficulties and is otherwise without complaint today.    Atrial Fibrillation Risk Factors:  he does have symptoms or diagnosis of sleep apnea. he does not have a history of rheumatic fever.  Atrial Fibrillation Management history:   Previous antiarrhythmic drugs: none Previous cardioversions: 05/14/24 Previous ablations: 09/19/24 Anticoagulation history: Eliquis    Past Medical History:  Diagnosis Date   Arrhythmia    Arthritis    Asthma    Atrial fibrillation (HCC)    Biliary colic 2014   Bronchiectasis (HCC)    2021   Bronchiectasis (HCC) 11/18/2024   Cataract    COPD (chronic obstructive pulmonary disease) with chronic bronchitis (HCC)    Coronary artery calcification seen on CAT scan    Cyst of pancreas    1 cm (7/18), repeat ct in 12 months.     Hyperlipidemia    Hypertension    Obstructive sleep apnea  Pneumonia 10/24/2024   left lower lobe   Renal disorder    Rhinitis    Sleep apnea    pt denies sleep apnea-lost weight    Current Outpatient Medications  Medication Sig Dispense Refill   acetaminophen  (TYLENOL ) 500 MG tablet Take 1,000 mg by mouth every 6 (six) hours as needed for mild pain (pain score 1-3).     alendronate  (FOSAMAX ) 70 MG tablet Take 1 tablet (70 mg total)  by mouth every 7 (seven) days. Take with a full glass of water  on an empty stomach. 4 tablet 11   budesonide -formoterol  (SYMBICORT ) 160-4.5 MCG/ACT inhaler Inhale 2 puffs into the lungs 2 (two) times daily.     cephALEXin  (KEFLEX ) 500 MG capsule Take 1 capsule (500 mg total) by mouth 3 (three) times daily. 21 capsule 0   diltiazem  (DILACOR XR ) 240 MG 24 hr capsule Take 1 capsule (240 mg total) by mouth daily. 90 capsule 1   ELIQUIS  5 MG TABS tablet TAKE 1 TABLET BY MOUTH TWICE A DAY 60 tablet 5   FIBER COMPLETE PO Take 1 tablet by mouth daily as needed (Constipation).     guaiFENesin  (MUCINEX ) 600 MG 12 hr tablet Take 1 tablet (600 mg total) by mouth 2 (two) times daily. 60 tablet 0   levalbuterol  (XOPENEX  HFA) 45 MCG/ACT inhaler Inhale 2 puffs into the lungs every 6 (six) hours as needed for wheezing. 1 each 12   levalbuterol  (XOPENEX ) 0.63 MG/3ML nebulizer solution Take 3 mLs (0.63 mg total) by nebulization 4 (four) times daily for 7 days, THEN 3 mLs (0.63 mg total) every 6 (six) hours as needed for wheezing or shortness of breath. 3 mL 3   metoprolol  succinate (TOPROL -XL) 100 MG 24 hr tablet Take 1 tablet (100 mg total) by mouth daily. Take with or immediately following a meal. 90 tablet 3   montelukast  (SINGULAIR ) 10 MG tablet TAKE 1 TABLET BY MOUTH EVERY DAY 90 tablet 2   Multiple Vitamins-Minerals (MULTIVITAMIN WITH MINERALS) tablet Take 1 tablet by mouth daily.     pantoprazole  (PROTONIX ) 40 MG tablet TAKE 1 TABLET BY MOUTH TWICE A DAY 180 tablet 2   rosuvastatin  (CRESTOR ) 20 MG tablet TAKE 1 TABLET BY MOUTH EVERY DAY **D/C LIPITOR** 90 tablet 2   tamsulosin  (FLOMAX ) 0.4 MG CAPS capsule Take 1 capsule (0.4 mg total) by mouth daily. 30 capsule 3   diphenoxylate -atropine  (LOMOTIL ) 2.5-0.025 MG tablet Take 2 tablets by mouth 4 (four) times daily as needed for diarrhea or loose stools. 30 tablet 0   dorzolamide -timolol  (COSOPT ) 2-0.5 % ophthalmic solution Place 1 drop into the right eye daily.      furosemide  (LASIX ) 40 MG tablet Take 1 tablet (40 mg total) by mouth daily. Take additional 20 mg if your weight is 3 lbs in 1 day of 5 lbs in 1 week (Patient not taking: Reported on 01/07/2025) 30 tablet 3   Iron , Ferrous Sulfate , 325 (65 Fe) MG TABS Take 325 mg by mouth daily. (Patient not taking: Reported on 01/07/2025) 30 tablet 11   loperamide (IMODIUM A-D) 2 MG tablet Take 2 mg by mouth 4 (four) times daily as needed for diarrhea or loose stools.     Respiratory Therapy Supplies (FLUTTER) DEVI Use as directed 1 each 0   sulfamethoxazole -trimethoprim  (BACTRIM  DS) 800-160 MG tablet Take 1 tablet by mouth 2 (two) times daily. 14 tablet 0   No current facility-administered medications for this encounter.    Allergies:   Dust mite extract  Social History:  The patient  reports that he quit smoking about 50 years ago. His smoking use included cigarettes. He started smoking about 61 years ago. He has a 22 pack-year smoking history. He has never used smokeless tobacco. He reports that he does not currently use alcohol after a past usage of about 1.0 standard drink of alcohol per week. He reports that he does not use drugs.   Family History:  The patient's  family history includes Anemia in his mother; Arrhythmia in his father; Arthritis in his sister; Asthma in his maternal uncle; Atrial fibrillation in his father; Colon polyps in his father; Rheumatic fever in his mother; Stroke in his father.    ROS:  Please see the history of present illness.   All other systems are personally reviewed and negative.   Exam: Well appearing, alert and conversant, regular work of breathing,  good skin color  Pulse 86, height 5' 8.5 (1.74 m).    CHA2DS2-VASc Score = 5  The patient's score is based upon: CHF History: 1 HTN History: 1 Diabetes History: 0 Stroke History: 0 Vascular Disease History: 1 Age Score: 2 Gender Score: 0       ASSESSMENT AND PLAN: Persistent Atrial Fibrillation (ICD10:   I48.19) The patient's CHA2DS2-VASc score is 5, indicating a 7.2% annual risk of stroke.   S/p afib ablation 09/19/24 with quick return of afib.  Dofetilide admission discussed but cost prohibitive. Would avoid class IC with h/o CAD and amiodarone is not ideal with baseline lung issues.  He states that he is currently unaware of his afib and would like to continue his present therapy. Will plan to regroup in 3 months to revisit rate vs rhythm control after he has gotten stronger. Amiodarone may be his only rhythm option.  Continue diltiazem  240 mg daily Continue Eliquis  5 mg BID Continue Toprol  100 mg daily  Secondary Hypercoagulable State (ICD10:  D68.69) The patient is at significant risk for stroke/thromboembolism based upon his CHA2DS2-VASc Score of 5.  Continue Apixaban  (Eliquis ). No bleeding issues.   CAD No anginal symptoms Followed by Dr Court  [facility]  Not currently on CPAP   Follow-up in the AF clinic in 3 months.   Current medicines are reviewed at length with the patient today.   The patient does not have concerns regarding his medicines.  The following changes were made today:  none    Patient Risk:  after full review of this patients clinical status, I feel that they are at moderate risk at this time.   Today, I have spent 15 minutes with the patient with telehealth technology discussing atrial fibrillation and his functional status.     Daril Kicks [facility] [address] [place], [place] [address] [phone]   I hereby voluntarily request, consent and authorize the Atrial Fibrillation Clinic and its employed or contracted physicians, physician assistants, nurse practitioners or other licensed health care professionals (the Practitioner), to provide me with telemedicine health care services (the Services) as deemed necessary by the treating Practitioner. I acknowledge and consent to receive the Services by the Practitioner via telemedicine. I understand  that the telemedicine visit will involve communicating with the Practitioner through live audiovisual communication technology and the disclosure of certain medical information by electronic transmission. I acknowledge that I have been given the opportunity to request an in-person assessment or other available alternative prior to the telemedicine visit and am voluntarily participating in the telemedicine visit.   I understand that I have the  right to withhold or withdraw my consent to the use of telemedicine in the course of my care at any time, without affecting my right to future care or treatment, and that the Practitioner or I may terminate the telemedicine visit at any time. I understand that I have the right to inspect all information obtained and/or recorded in the course of the telemedicine visit and may receive copies of available information for a reasonable fee.  I understand that some of the potential risks of receiving the Services via telemedicine include:   Delay or interruption in medical evaluation due to technological equipment failure or disruption;  Information transmitted may not be sufficient (e.g. poor resolution of images) to allow for appropriate medical decision making by the Practitioner; and/or  In rare instances, security protocols could fail, causing a breach of personal health information.   Furthermore, I acknowledge that it is my responsibility to provide information about my medical history, conditions and care that is complete and accurate to the best of my ability. I acknowledge that Practitioner's advice, recommendations, and/or decision may be based on factors not within their control, such as incomplete or inaccurate data provided by me or distortions of diagnostic images or specimens that may result from electronic transmissions. I understand that the practice of medicine is not an exact science and that Practitioner makes no warranties or guarantees regarding  treatment outcomes. I acknowledge that I will receive a copy of this consent concurrently upon execution via email to the email address I last provided but may also request a printed copy by calling the office of the Atrial Fibrillation Clinic.  I understand that my insurance will be billed for this visit.   I have read or had this consent read to me.  I understand the contents of this consent, which adequately explains the benefits and risks of the Services being provided via telemedicine.  I have been provided ample opportunity to ask questions regarding this consent and the Services and have had my questions answered to my satisfaction.  I give my informed consent for the services to be provided through the use of telemedicine in my medical care  By participating in this telemedicine visit I agree to the above.    "

## 2025-01-07 NOTE — Telephone Encounter (Signed)
 Message received from Kenya Fountain with scheduling: Good Morning this patient declined to schedule for CT abdomen pelvis wo, stated that he is unable to make it out of the house, and will call back to schedule when he is ready. Just wanted to make you aware. Thank you!

## 2025-01-09 ENCOUNTER — Other Ambulatory Visit: Payer: Self-pay

## 2025-01-09 ENCOUNTER — Encounter (HOSPITAL_COMMUNITY)

## 2025-01-09 DIAGNOSIS — I480 Paroxysmal atrial fibrillation: Secondary | ICD-10-CM

## 2025-01-09 DIAGNOSIS — J4489 Other specified chronic obstructive pulmonary disease: Secondary | ICD-10-CM

## 2025-01-09 DIAGNOSIS — R5381 Other malaise: Secondary | ICD-10-CM

## 2025-01-14 ENCOUNTER — Telehealth: Payer: Self-pay

## 2025-01-14 ENCOUNTER — Encounter (HOSPITAL_COMMUNITY)

## 2025-01-14 NOTE — Telephone Encounter (Signed)
 Copied from CRM #[id number]. Topic: Clinical - Order For Equipment >> Jan 13, 2025  3:57 PM Rosaria BRAVO wrote: Reason for CRM: Rian Ada NP from [facility] called regarding the patient's Oxygen equipment ordered a month ago that he has yet to receive. Please advise  Please contact the patient, the DME company is missing needed information.  Best contact: [phone] option 3 for the nurse line

## 2025-01-14 NOTE — Telephone Encounter (Signed)
 Copied from CRM #[id number]. Topic: Clinical - Medication Prior Auth >> Dec 27, 2024 11:07 AM Zebedee SAUNDERS wrote: Reason for CRM: Received call from [facility] per [place] [phone] opt. 1 regarding pt's Oxygen Concentrator and the prior authorization that was needed. >> Jan 13, 2025 12:29 PM Terri MATSU wrote: Tobias from [employer] calling for status for patient oxygen concentrator. Advised her an order was refaxed on 1.14 and she provided me with a new fax number to send to. [phone]

## 2025-01-14 NOTE — Telephone Encounter (Signed)
 Copied from CRM #[id number]. Topic: Clinical - Home Health Verbal Orders >> Jan 13, 2025  3:41 PM Everette C wrote: Caller/Agency: Katrina / [facility]  Callback Number: [phone]  Service Requested: Physical Therapy Frequency: start of care 01/15/25 Any new concerns about the patient? No

## 2025-01-14 NOTE — Telephone Encounter (Signed)
 Copied from CRM #[id number]. Topic: Clinical - Medication Question >> Jan 13, 2025  2:16 PM Avram MATSU wrote: Reason for CRM: stationary oxygen concentrator would need to be called in to [facility] in order for the patient to received portal oxygen   Fax:[phone]    ----------------------------------------------------------------------- From previous Reason for Contact - Other: Reason for CRM:

## 2025-01-15 ENCOUNTER — Other Ambulatory Visit: Payer: Self-pay

## 2025-01-16 ENCOUNTER — Encounter (HOSPITAL_COMMUNITY)

## 2025-01-16 ENCOUNTER — Telehealth: Payer: Self-pay

## 2025-01-16 NOTE — Telephone Encounter (Signed)
 Copied from CRM [id number]. Topic: Clinical - Medical Advice >> Jan 16, 2025  9:09 AM Montie POUR wrote: Reason for CRM:  Flor with [facility] and she said Mr. Norfolk cannot walk at home and cannot take care of himself. And Dannielle would like Dr Duanne or his nurse to call her about what to do and what service he could qualify for. Please call her at [phone] to discuss.

## 2025-01-17 ENCOUNTER — Telehealth: Payer: Self-pay

## 2025-01-17 NOTE — Telephone Encounter (Signed)
 Copied from CRM #[id number]. Topic: Referral - Status >> Jan 16, 2025  1:06 PM Mario [name] wrote: Reason for CRM: Mario [name] from [facility] called to let provider know that they have to decline referral that was sent over for patient. Thye do not have the staff right now to accommodate patient.

## 2025-01-19 ENCOUNTER — Encounter (HOSPITAL_COMMUNITY): Payer: Self-pay | Admitting: Emergency Medicine

## 2025-01-19 ENCOUNTER — Inpatient Hospital Stay (HOSPITAL_COMMUNITY)
Admission: EM | Admit: 2025-01-19 | Source: Home / Self Care | Attending: Internal Medicine | Admitting: Internal Medicine

## 2025-01-19 ENCOUNTER — Other Ambulatory Visit: Payer: Self-pay

## 2025-01-19 ENCOUNTER — Emergency Department (HOSPITAL_COMMUNITY)

## 2025-01-19 DIAGNOSIS — B372 Candidiasis of skin and nail: Secondary | ICD-10-CM | POA: Diagnosis present

## 2025-01-19 DIAGNOSIS — J9621 Acute and chronic respiratory failure with hypoxia: Principal | ICD-10-CM

## 2025-01-19 DIAGNOSIS — R197 Diarrhea, unspecified: Secondary | ICD-10-CM | POA: Diagnosis not present

## 2025-01-19 DIAGNOSIS — A0472 Enterocolitis due to Clostridium difficile, not specified as recurrent: Secondary | ICD-10-CM

## 2025-01-19 DIAGNOSIS — R112 Nausea with vomiting, unspecified: Secondary | ICD-10-CM | POA: Diagnosis not present

## 2025-01-19 DIAGNOSIS — E782 Mixed hyperlipidemia: Secondary | ICD-10-CM

## 2025-01-19 DIAGNOSIS — J471 Bronchiectasis with (acute) exacerbation: Secondary | ICD-10-CM | POA: Diagnosis present

## 2025-01-19 DIAGNOSIS — I5030 Unspecified diastolic (congestive) heart failure: Secondary | ICD-10-CM | POA: Diagnosis present

## 2025-01-19 DIAGNOSIS — J9601 Acute respiratory failure with hypoxia: Secondary | ICD-10-CM | POA: Diagnosis present

## 2025-01-19 DIAGNOSIS — I5021 Acute systolic (congestive) heart failure: Secondary | ICD-10-CM

## 2025-01-19 DIAGNOSIS — R6510 Systemic inflammatory response syndrome (SIRS) of non-infectious origin without acute organ dysfunction: Secondary | ICD-10-CM | POA: Diagnosis present

## 2025-01-19 DIAGNOSIS — R7989 Other specified abnormal findings of blood chemistry: Secondary | ICD-10-CM | POA: Diagnosis present

## 2025-01-19 DIAGNOSIS — I5033 Acute on chronic diastolic (congestive) heart failure: Secondary | ICD-10-CM

## 2025-01-19 DIAGNOSIS — A419 Sepsis, unspecified organism: Secondary | ICD-10-CM

## 2025-01-19 DIAGNOSIS — I4891 Unspecified atrial fibrillation: Secondary | ICD-10-CM | POA: Diagnosis present

## 2025-01-19 DIAGNOSIS — I5023 Acute on chronic systolic (congestive) heart failure: Secondary | ICD-10-CM | POA: Insufficient documentation

## 2025-01-19 DIAGNOSIS — E785 Hyperlipidemia, unspecified: Secondary | ICD-10-CM | POA: Diagnosis present

## 2025-01-19 DIAGNOSIS — E872 Acidosis, unspecified: Secondary | ICD-10-CM | POA: Diagnosis present

## 2025-01-19 DIAGNOSIS — N179 Acute kidney failure, unspecified: Secondary | ICD-10-CM | POA: Diagnosis present

## 2025-01-19 DIAGNOSIS — E162 Hypoglycemia, unspecified: Secondary | ICD-10-CM | POA: Diagnosis present

## 2025-01-19 LAB — I-STAT ARTERIAL BLOOD GAS, ED
Acid-base deficit: 13 mmol/L — ABNORMAL HIGH (ref 0.0–2.0)
Bicarbonate: 15 mmol/L — ABNORMAL LOW (ref 20.0–28.0)
Calcium, Ion: 1.09 mmol/L — ABNORMAL LOW (ref 1.15–1.40)
HCT: 39 % (ref 39.0–52.0)
Hemoglobin: 13.3 g/dL (ref 13.0–17.0)
O2 Saturation: 98 %
Patient temperature: 98.6
Potassium: 3.5 mmol/L (ref 3.5–5.1)
Sodium: 135 mmol/L (ref 135–145)
TCO2: 16 mmol/L — ABNORMAL LOW (ref 22–32)
pCO2 arterial: 43.2 mmHg (ref 32–48)
pH, Arterial: 7.15 — CL (ref 7.35–7.45)
pO2, Arterial: 136 mmHg — ABNORMAL HIGH (ref 83–108)

## 2025-01-19 LAB — CBC WITH DIFFERENTIAL/PLATELET
Abs Immature Granulocytes: 0.5 10*3/uL — ABNORMAL HIGH (ref 0.00–0.07)
Basophils Absolute: 0.3 10*3/uL — ABNORMAL HIGH (ref 0.0–0.1)
Basophils Relative: 1 %
Eosinophils Absolute: 0 10*3/uL (ref 0.0–0.5)
Eosinophils Relative: 0 %
HCT: 41.4 % (ref 39.0–52.0)
Hemoglobin: 13 g/dL (ref 13.0–17.0)
Lymphocytes Relative: 4 %
Lymphs Abs: 1 10*3/uL (ref 0.7–4.0)
MCH: 30.4 pg (ref 26.0–34.0)
MCHC: 31.4 g/dL (ref 30.0–36.0)
MCV: 97 fL (ref 80.0–100.0)
Monocytes Absolute: 0.5 10*3/uL (ref 0.1–1.0)
Monocytes Relative: 2 %
Myelocytes: 2 %
Neutro Abs: 23.6 10*3/uL — ABNORMAL HIGH (ref 1.7–7.7)
Neutrophils Relative %: 91 %
Platelets: 275 10*3/uL (ref 150–400)
RBC: 4.27 MIL/uL (ref 4.22–5.81)
RDW: 17.2 % — ABNORMAL HIGH (ref 11.5–15.5)
WBC: 25.9 10*3/uL — ABNORMAL HIGH (ref 4.0–10.5)
nRBC: 0.1 % (ref 0.0–0.2)

## 2025-01-19 LAB — PRO BRAIN NATRIURETIC PEPTIDE: Pro Brain Natriuretic Peptide: 9529 pg/mL — ABNORMAL HIGH

## 2025-01-19 LAB — COMPREHENSIVE METABOLIC PANEL WITH GFR
ALT: 15 U/L (ref 0–44)
AST: 32 U/L (ref 15–41)
Albumin: 2.5 g/dL — ABNORMAL LOW (ref 3.5–5.0)
Alkaline Phosphatase: 89 U/L (ref 38–126)
Anion gap: 20 — ABNORMAL HIGH (ref 5–15)
BUN: 44 mg/dL — ABNORMAL HIGH (ref 8–23)
CO2: 13 mmol/L — ABNORMAL LOW (ref 22–32)
Calcium: 8.3 mg/dL — ABNORMAL LOW (ref 8.9–10.3)
Chloride: 103 mmol/L (ref 98–111)
Creatinine, Ser: 2.52 mg/dL — ABNORMAL HIGH (ref 0.61–1.24)
GFR, Estimated: 25 mL/min — ABNORMAL LOW
Glucose, Bld: 52 mg/dL — ABNORMAL LOW (ref 70–99)
Potassium: 3.8 mmol/L (ref 3.5–5.1)
Sodium: 136 mmol/L (ref 135–145)
Total Bilirubin: 0.7 mg/dL (ref 0.0–1.2)
Total Protein: 5.8 g/dL — ABNORMAL LOW (ref 6.5–8.1)

## 2025-01-19 LAB — I-STAT CHEM 8, ED
BUN: 59 mg/dL — ABNORMAL HIGH (ref 8–23)
Calcium, Ion: 0.86 mmol/L — CL (ref 1.15–1.40)
Chloride: 107 mmol/L (ref 98–111)
Creatinine, Ser: 2.7 mg/dL — ABNORMAL HIGH (ref 0.61–1.24)
Glucose, Bld: 53 mg/dL — ABNORMAL LOW (ref 70–99)
HCT: 46 % (ref 39.0–52.0)
Hemoglobin: 15.6 g/dL (ref 13.0–17.0)
Potassium: 4.3 mmol/L (ref 3.5–5.1)
Sodium: 134 mmol/L — ABNORMAL LOW (ref 135–145)
TCO2: 14 mmol/L — ABNORMAL LOW (ref 22–32)

## 2025-01-19 LAB — RESP PANEL BY RT-PCR (RSV, FLU A&B, COVID)  RVPGX2
Influenza A by PCR: NEGATIVE
Influenza B by PCR: NEGATIVE
Resp Syncytial Virus by PCR: NEGATIVE
SARS Coronavirus 2 by RT PCR: NEGATIVE

## 2025-01-19 LAB — I-STAT CG4 LACTIC ACID, ED
Lactic Acid, Venous: 1.8 mmol/L (ref 0.5–1.9)
Lactic Acid, Venous: 1.8 mmol/L (ref 0.5–1.9)

## 2025-01-19 LAB — GLUCOSE, CAPILLARY
Glucose-Capillary: 147 mg/dL — ABNORMAL HIGH (ref 70–99)
Glucose-Capillary: 157 mg/dL — ABNORMAL HIGH (ref 70–99)
Glucose-Capillary: 159 mg/dL — ABNORMAL HIGH (ref 70–99)

## 2025-01-19 LAB — TROPONIN T, HIGH SENSITIVITY
Troponin T High Sensitivity: 99 ng/L — ABNORMAL HIGH (ref 0–19)
Troponin T High Sensitivity: 78 ng/L — ABNORMAL HIGH (ref 0–19)

## 2025-01-19 LAB — CBG MONITORING, ED: Glucose-Capillary: 71 mg/dL (ref 70–99)

## 2025-01-19 LAB — PROTIME-INR
INR: 2.1 — ABNORMAL HIGH (ref 0.8–1.2)
Prothrombin Time: 24.7 s — ABNORMAL HIGH (ref 11.4–15.2)

## 2025-01-19 LAB — MAGNESIUM: Magnesium: 2.1 mg/dL (ref 1.7–2.4)

## 2025-01-19 LAB — PROCALCITONIN: Procalcitonin: 0.93 ng/mL

## 2025-01-19 MED ORDER — BUDESONIDE 0.5 MG/2ML IN SUSP
0.5000 mg | Freq: Two times a day (BID) | RESPIRATORY_TRACT | Status: AC
  Administered 2025-01-19 – 2025-01-24 (×11): 0.5 mg via RESPIRATORY_TRACT
  Filled 2025-01-19 (×12): qty 2

## 2025-01-19 MED ORDER — SODIUM CHLORIDE 0.9 % IV SOLN
100.0000 mg | Freq: Once | INTRAVENOUS | Status: AC
  Administered 2025-01-19: 100 mg via INTRAVENOUS
  Filled 2025-01-19: qty 100

## 2025-01-19 MED ORDER — SODIUM CHLORIDE 0.9 % IV SOLN
2.0000 g | INTRAVENOUS | Status: DC
  Administered 2025-01-20: 2 g via INTRAVENOUS
  Filled 2025-01-19: qty 20

## 2025-01-19 MED ORDER — LACTATED RINGERS IV SOLN: INTRAVENOUS | Status: DC

## 2025-01-19 MED ORDER — LEVALBUTEROL HCL 0.63 MG/3ML IN NEBU
0.6300 mg | INHALATION_SOLUTION | Freq: Four times a day (QID) | RESPIRATORY_TRACT | Status: DC
  Administered 2025-01-19 – 2025-01-20 (×2): 0.63 mg via RESPIRATORY_TRACT
  Filled 2025-01-19 (×2): qty 3

## 2025-01-19 MED ORDER — SODIUM CHLORIDE 0.9% FLUSH
3.0000 mL | Freq: Two times a day (BID) | INTRAVENOUS | Status: AC
  Administered 2025-01-19 – 2025-01-24 (×10): 3 mL via INTRAVENOUS

## 2025-01-19 MED ORDER — FUROSEMIDE 10 MG/ML IJ SOLN
60.0000 mg | Freq: Once | INTRAMUSCULAR | Status: AC
  Administered 2025-01-19: 60 mg via INTRAVENOUS
  Filled 2025-01-19: qty 6

## 2025-01-19 MED ORDER — DEXTROSE 50 % IV SOLN
1.0000 | INTRAVENOUS | Status: AC | PRN
  Administered 2025-01-19: 50 mL via INTRAVENOUS
  Filled 2025-01-19: qty 50

## 2025-01-19 MED ORDER — SODIUM CHLORIDE 0.9 % IV BOLUS
500.0000 mL | Freq: Once | INTRAVENOUS | Status: AC
  Administered 2025-01-19: 500 mL via INTRAVENOUS

## 2025-01-19 MED ORDER — SODIUM CHLORIDE 0.9 % IV SOLN: 2.0000 g | INTRAVENOUS | Status: DC

## 2025-01-19 MED ORDER — AMIODARONE LOAD VIA INFUSION
150.0000 mg | Freq: Once | INTRAVENOUS | Status: AC
  Administered 2025-01-19: 150 mg via INTRAVENOUS
  Filled 2025-01-19: qty 83.34

## 2025-01-19 MED ORDER — IPRATROPIUM-ALBUTEROL 0.5-2.5 (3) MG/3ML IN SOLN
RESPIRATORY_TRACT | Status: AC
  Administered 2025-01-19: 3 mL
  Filled 2025-01-19: qty 3

## 2025-01-19 MED ORDER — METOPROLOL TARTRATE 5 MG/5ML IV SOLN
5.0000 mg | Freq: Once | INTRAVENOUS | Status: AC
  Administered 2025-01-19: 5 mg via INTRAVENOUS
  Filled 2025-01-19: qty 5

## 2025-01-19 MED ORDER — NYSTATIN 100000 UNIT/GM EX POWD
Freq: Two times a day (BID) | CUTANEOUS | Status: AC
  Administered 2025-01-23: 1 via TOPICAL
  Filled 2025-01-19: qty 15

## 2025-01-19 MED ORDER — SODIUM CHLORIDE 0.9 % IV SOLN
2.0000 g | Freq: Once | INTRAVENOUS | Status: AC
  Administered 2025-01-19: 2 g via INTRAVENOUS
  Filled 2025-01-19: qty 20

## 2025-01-19 MED ORDER — AMIODARONE HCL IN DEXTROSE 360-4.14 MG/200ML-% IV SOLN
30.0000 mg/h | INTRAVENOUS | Status: DC
  Administered 2025-01-19 – 2025-01-23 (×8): 30 mg/h via INTRAVENOUS
  Filled 2025-01-19 (×10): qty 200

## 2025-01-19 MED ORDER — ACETAMINOPHEN 325 MG PO TABS
650.0000 mg | ORAL_TABLET | Freq: Four times a day (QID) | ORAL | Status: AC | PRN
  Administered 2025-01-24: 650 mg via ORAL
  Filled 2025-01-19: qty 2

## 2025-01-19 MED ORDER — MONTELUKAST SODIUM 10 MG PO TABS
10.0000 mg | ORAL_TABLET | Freq: Every day | ORAL | Status: AC
  Administered 2025-01-20 – 2025-01-24 (×5): 10 mg via ORAL
  Filled 2025-01-19 (×5): qty 1

## 2025-01-19 MED ORDER — METHYLPREDNISOLONE SODIUM SUCC 40 MG IJ SOLR
40.0000 mg | Freq: Two times a day (BID) | INTRAMUSCULAR | Status: DC
  Administered 2025-01-19 – 2025-01-20 (×3): 40 mg via INTRAVENOUS
  Filled 2025-01-19 (×3): qty 1

## 2025-01-19 MED ORDER — LACTATED RINGERS IV BOLUS
500.0000 mL | Freq: Once | INTRAVENOUS | Status: AC
  Administered 2025-01-19: 500 mL via INTRAVENOUS

## 2025-01-19 MED ORDER — ACETAMINOPHEN 650 MG RE SUPP: 650.0000 mg | Freq: Four times a day (QID) | RECTAL | Status: AC | PRN

## 2025-01-19 MED ORDER — FUROSEMIDE 10 MG/ML IJ SOLN: 80.0000 mg | Freq: Once | INTRAMUSCULAR | Status: DC

## 2025-01-19 MED ORDER — ALBUTEROL SULFATE (2.5 MG/3ML) 0.083% IN NEBU
2.5000 mg | INHALATION_SOLUTION | RESPIRATORY_TRACT | Status: AC | PRN
  Filled 2025-01-19: qty 3

## 2025-01-19 MED ORDER — AMIODARONE HCL IN DEXTROSE 360-4.14 MG/200ML-% IV SOLN
60.0000 mg/h | INTRAVENOUS | Status: AC
  Administered 2025-01-19 (×2): 60 mg/h via INTRAVENOUS
  Filled 2025-01-19 (×2): qty 200

## 2025-01-19 MED ORDER — SODIUM CHLORIDE 0.9 % IV SOLN: 500.0000 mg | Freq: Once | INTRAVENOUS | Status: DC

## 2025-01-19 MED ORDER — SODIUM CHLORIDE 0.9 % IV SOLN
100.0000 mg | Freq: Two times a day (BID) | INTRAVENOUS | Status: DC
  Administered 2025-01-19 – 2025-01-20 (×3): 100 mg via INTRAVENOUS
  Filled 2025-01-19 (×5): qty 100

## 2025-01-19 MED ORDER — DILTIAZEM HCL 25 MG/5ML IV SOLN
10.0000 mg | Freq: Once | INTRAVENOUS | Status: AC
  Administered 2025-01-19: 10 mg via INTRAVENOUS
  Filled 2025-01-19 (×3): qty 5

## 2025-01-19 NOTE — Sepsis Progress Note (Addendum)
 Elink following code sepsis  1047 Notified provider and bedside nurse of need to order fluid bolus. MD responded waiting for BNP before given additional fluids

## 2025-01-19 NOTE — Progress Notes (Signed)
 Pt transported from ED33 to 6E18 by RN and RT w/o complications

## 2025-01-19 NOTE — H&P (Signed)
 " History and Physical    Patient: Mario [name] FMW:[id number] DOB: 1946-05-30 DOA: 01/19/2025 DOS: the patient was seen and examined on 01/19/2025 PCP: [name] DASEN, MD  Patient coming from: Home  Chief Complaint:  Chief Complaint  Patient presents with   Shortness of Breath    A-fib RVR   HPI: Mario [name] is a 79 y.o. male with medical history significant of hypertension, hyperlipidemia, persistent atrial fibrillation, CAD, COPD with bronchiectasis presents with nausea, vomiting, and diarrhea.  He has been experiencing nausea and vomiting for the past four to five days. The vomit did not contain blood and was described as having 'a little bit' of food content. He has been unable to keep his medications down due to vomiting.  He has had diarrhea for the past few days and has not been able to eat for five days, impacting his nutritional intake. No fever has been reported.  He experiences shortness of breath and coughing. He was just hospitalized last month for left hand swelling with the possibility of an infectious versus inflammatory process which he was on antibiotics of Augmentin  and linezolid  until January 26.  He has a history of arrhythmia and asthma. His heart rate was noted to be very fast, reaching into the 80s.  With EMS patient reported to be hypoxic down to 71% on room air and A-fib with heart rates into the 190s.  Patient had been given 1 L normal saline IV fluids, Solu-Medrol  125 mg IV, and 2 DuoNeb breathing treatments.  In the emergency department patient was noted to have a temperature of 99.3 F with heart rates elevated up to 168, respirations 16-26, blood pressures as low as 69/59 with improvement up to 109/92, and O2 saturation is 83% on room air with improvement on BiPAP to greater than 92%.  Labs significant for WBC 25.9, CO2 13, BUN 44, creatinine 2.52, glucose 52, anion gap 20, lactic acid 1.8, proBNP 9529, and high-sensitivity troponin 99 influenza,  Chest  x-ray noted a constellation of findings concerning for pulmonary edema with small bilateral pleural effusions and bibasilar atelectasis. COVID-19, and RSV screening were negative.  Patient had initially been bolused approximately 1.5 L of IV fluids due to hypotension and given metoprolol  IV for rate control without improvement.  Cardiology was consulted and patient was started on a amiodarone  drip.  Patient had also been given empiric antibiotics of Rocephin  and doxycycline  and due to concern for possible infection.  After reevaluation patient was given Lasix  60 mg IV.  Review of Systems: As mentioned in the history of present illness. All other systems reviewed and are negative. Past Medical History:  Diagnosis Date   Arrhythmia    Arthritis    Asthma    Atrial fibrillation (HCC)    Biliary colic 2014   Bronchiectasis (HCC)    2021   Bronchiectasis (HCC) 11/18/2024   Cataract    COPD (chronic obstructive pulmonary disease) with chronic bronchitis (HCC)    Coronary artery calcification seen on CAT scan    Cyst of pancreas    1 cm (7/18), repeat ct in 12 months.     Hyperlipidemia    Hypertension    Obstructive sleep apnea    Pneumonia 10/24/2024   left lower lobe   Renal disorder    Rhinitis    Sleep apnea    pt denies sleep apnea-lost weight   Past Surgical History:  Procedure Laterality Date   ATRIAL FIBRILLATION ABLATION N/A 09/19/2024   Procedure:  ATRIAL FIBRILLATION ABLATION;  Surgeon: Nancey Eulas BRAVO, MD;  Location: [facility];  Service: Cardiovascular;  Laterality: N/A;   CARDIOVERSION N/A 05/14/2024   Procedure: CARDIOVERSION;  Surgeon: Michele Richardson, DO;  Location: [facility];  Service: Cardiovascular;  Laterality: N/A;   CATARACT EXTRACTION Right    CHOLECYSTECTOMY     COLONOSCOPY  09/29/2014   INCISION AND DRAINAGE OF DEEP ABSCESS, HAND Left 11/29/2024   Procedure: INCISION AND DRAINAGE OF DEEP ABSCESS, HAND;  Surgeon: Shari Easter, MD;  Location:  [facility] OR;  Service: Orthopedics;  Laterality: Left;   INCISION AND DRAINAGE OF DEEP ABSCESS, HAND Left 12/07/2024   Procedure: INCISION AND DRAINAGE OF DEEP ABSCESS, HAND;  Surgeon: Shari Easter, MD;  Location: [facility] OR;  Service: Orthopedics;  Laterality: Left;   INCISION AND DRAINAGE OF WOUND Left 03/13/2024   Procedure: IRRIGATION AND DEBRIDEMENT WOUND;  Surgeon: Shari Easter, MD;  Location: [facility] OR;  Service: Orthopedics;  Laterality: Left;  IRRIGATION AND DEBRIDEMENT FIFTH FINGER LEFT   IR RADIOLOGIST EVAL & MGMT  09/12/2024   LAPAROSCOPIC CHOLECYSTECTOMY SINGLE SITE WITH INTRAOPERATIVE CHOLANGIOGRAM N/A 09/15/2019   Procedure: LAPAROSCOPIC CHOLECYSTECTOMY SINGLE SITE;  Surgeon: Sheldon Standing, MD;  Location: [facility];  Service: General;  Laterality: N/A;   LITHOTRIPSY  2000   for kidney stone   Social History:  reports that he quit smoking about 50 years ago. His smoking use included cigarettes. He started smoking about 61 years ago. He has a 22 pack-year smoking history. He has never used smokeless tobacco. He reports that he does not currently use alcohol after a past usage of about 1.0 standard drink of alcohol per week. He reports that he does not use drugs.  Allergies[1]  Family History  Problem Relation Age of Onset   Rheumatic fever Mother    Anemia Mother    Stroke Father    Atrial fibrillation Father    Colon polyps Father    Arrhythmia Father    Arthritis Sister    Asthma Maternal Uncle    Colon cancer Neg Hx    Esophageal cancer Neg Hx    Rectal cancer Neg Hx    Stomach cancer Neg Hx     Prior to Admission medications  Medication Sig Start Date End Date Taking? Authorizing Provider  acetaminophen  (TYLENOL ) 500 MG tablet Take 1,000 mg by mouth every 6 (six) hours as needed for mild pain (pain score 1-3).    [provider]  alendronate  (FOSAMAX ) 70 MG tablet Take 1 tablet (70 mg total) by mouth every 7 (seven) days. Take with a full glass of water  on an empty  stomach. 09/06/24   [name] DASEN, MD  budesonide -formoterol  (SYMBICORT ) 160-4.5 MCG/ACT inhaler Inhale 2 puffs into the lungs 2 (two) times daily. 11/23/24   [provider]  cephALEXin  (KEFLEX ) 500 MG capsule Take 1 capsule (500 mg total) by mouth 3 (three) times daily. 01/03/25   [name] DASEN, MD  diltiazem  (DILACOR XR ) 240 MG 24 hr capsule Take 1 capsule (240 mg total) by mouth daily. 10/24/24   [name] DASEN, MD  diphenoxylate -atropine  (LOMOTIL ) 2.5-0.025 MG tablet Take 2 tablets by mouth 4 (four) times daily as needed for diarrhea or loose stools. 12/20/24   [name] DASEN, MD  dorzolamide -timolol  (COSOPT ) 2-0.5 % ophthalmic solution Place 1 drop into the right eye daily. 10/26/24   [provider]  ELIQUIS  5 MG TABS tablet TAKE 1 TABLET BY MOUTH TWICE A DAY 08/23/24  Court Dorn PARAS, MD  FIBER COMPLETE PO Take 1 tablet by mouth daily as needed (Constipation).    [provider]  furosemide  (LASIX ) 40 MG tablet Take 1 tablet (40 mg total) by mouth daily. Take additional 20 mg if your weight is 3 lbs in 1 day of 5 lbs in 1 week Patient not taking: Reported on 01/07/2025 10/21/24   [name] DASEN, MD  guaiFENesin  (MUCINEX ) 600 MG 12 hr tablet Take 1 tablet (600 mg total) by mouth 2 (two) times daily. 04/18/24   Tobie Yetta HERO, MD  Iron , Ferrous Sulfate , 325 (65 Fe) MG TABS Take 325 mg by mouth daily. Patient not taking: Reported on 01/07/2025 09/06/24   [name] DASEN, MD  levalbuterol  (XOPENEX  HFA) 45 MCG/ACT inhaler Inhale 2 puffs into the lungs every 6 (six) hours as needed for wheezing. 04/26/24   [name] DASEN, MD  levalbuterol  (XOPENEX ) 0.63 MG/3ML nebulizer solution Take 3 mLs (0.63 mg total) by nebulization 4 (four) times daily for 7 days, THEN 3 mLs (0.63 mg total) every 6 (six) hours as needed for wheezing or shortness of breath. 10/21/24 01/07/25  [name] DASEN, MD  loperamide (IMODIUM A-D) 2 MG tablet Take 2 mg by mouth 4 (four) times daily as  needed for diarrhea or loose stools.    [provider]  metoprolol  succinate (TOPROL -XL) 100 MG 24 hr tablet Take 1 tablet (100 mg total) by mouth daily. Take with or immediately following a meal. 04/26/24   Vicci Rollo SAUNDERS, PA-C  montelukast  (SINGULAIR ) 10 MG tablet TAKE 1 TABLET BY MOUTH EVERY DAY 06/03/24   [name] DASEN, MD  Multiple Vitamins-Minerals (MULTIVITAMIN WITH MINERALS) tablet Take 1 tablet by mouth daily.    [provider]  pantoprazole  (PROTONIX ) 40 MG tablet TAKE 1 TABLET BY MOUTH TWICE A DAY 06/03/24   [name] DASEN, MD  Respiratory Therapy Supplies (FLUTTER) DEVI Use as directed 07/05/18   Darlean Ozell NOVAK, MD  rosuvastatin  (CRESTOR ) 20 MG tablet TAKE 1 TABLET BY MOUTH EVERY DAY **D/C LIPITOR** 10/21/24   [name] DASEN, MD  sulfamethoxazole -trimethoprim  (BACTRIM  DS) 800-160 MG tablet Take 1 tablet by mouth 2 (two) times daily. 12/24/24   [name] DASEN, MD  tamsulosin  (FLOMAX ) 0.4 MG CAPS capsule Take 1 capsule (0.4 mg total) by mouth daily. 01/03/25   [name] DASEN, MD    Physical Exam: Vitals:   01/19/25 0945 01/19/25 1000 01/19/25 1005 01/19/25 1109  BP: 94/66 (!) 69/59 (!) 81/57 102/82  Pulse:    (!) 162  Resp: 17 18 19 16   Temp:      TempSrc:      SpO2:    97%    Constitutional: Elderly male who appears to be ill and lethargic Eyes: PERRL, lids and conjunctivae normal ENMT: Mucous membranes are moist.  Neck: normal, supple, no JVD appreciated. Respiratory: Currently on BiPAP with decreased aeration Cardiovascular: Regular rate and rhythm, no murmurs / rubs / gallops. No extremity edema. 2+ pedal pulses. No carotid bruits.  Abdomen: no tenderness, no masses palpated. No hepatosplenomegaly. Bowel sounds positive.  Musculoskeletal: no clubbing / cyanosis. No joint deformity upper and lower extremities. Good ROM, no contractures. Normal muscle tone.  Skin: no rashes, lesions, ulcers. No induration Neurologic: CN 2-12 grossly intact.  Sensation intact, DTR normal. Strength 5/5 in all 4.  Psychiatric: Normal judgment and insight. Alert and oriented x 3. Normal mood.   Data Reviewed:  EKG initially read as concern for supraventricular tachycardia  at 198 bpm, but on repeat check appears to show atrial fibrillation with RVR at 186 bpm.  Reviewed labs, imaging, pertinent records as documented.  Assessment and Plan:  SIRS/sepsis Acute respiratory failure with hypoxia COPD/Bronchecitasis exacerbation Patient presents with complaints of nausea, vomiting, diarrhea, cough, and shortness of breath.  Found to be hypoxic down to 71% on EMS arrival.  Noted to be tachycardic and tachypneic with leukocytosis meeting SIRS criteria.  Chest x-ray showing a constellation of findings favoring to reflect pulmonary edema with small bilateral pleural effusions and bibasilar atelectasis.  Influenza and COVID-19 screening were negative.  ABG noted pH to be 7.15 with pCO2 43.2 and pO2 136 on BiPAP.  He has a known history of bronchiectasis with prior admissions for pneumonia.  He had been given Solu-Medrol  125 mg IV and treatments prior to arrival. - Admit to a progressive bed - Continuous pulse oximetry with oxygen maintain O2 saturation greater than 90% - Continue BiPAP and wean off when deemed medically appropriate - Incentive spirometry and flutter valve once able - Follow-up blood cultures - Procalcitoninin - Continue empiric antibiotics of Rocephin  and doxycyline  - Levalbuterol  nebs 4 times daily - Budesonide  nebs twice daily - Solu-Medrol  IV twice daily.  Transition to p.o. when deemed medically appropriate  Atrial fibrillation with RVR Patient presented with heart rates elevated into the 190s.  CHA2DS2-VASc score equal to 5.  Initial attempts of rate control with metoprolol  were unsuccessful and patient was noted to be hypotensive.  Cardiology consulted and recommended starting amiodarone  drip. - Goal potassium at least 4 and magnesium at  least 2 - Continue amiodarone  drip - Appreciate cardiology consultative services, will follow-up for any further recommendations.  Heart failure with preserved ejection fraction Acute on chronic.  Patient does not appear grossly fluid overloaded at this time.  Pro BNP elevated at 9529.  Chest x-ray noted Constellation of findings concerning for possible pulmonary edema with small bilateral pleural effusions and bibasilar atelectasis.  Last echocardiogram noted EF to be 60 to 65% with indeterminate diastolic parameters when last checked 12/07/2024.  Thought to possibly be related to initial presentation as patient was not rapid atrial fibrillation and had received IV fluid boluses.  Patient was given Lasix  60 mg IV 1 dose. - Strict I&O's and daily weights - Reassess and determine need of further IV diuresis in a.m.  Acute kidney injury Metabolic acidosis with high anion gap Creatinine noted to be elevated at 2.52 with BUN 44, CO2 13, and anion gap 20.  Baseline creatinine noted to be within normal limits at around 1.  Lactic acid was was not significantly elevated.  Patient initially received IV fluid boluses.  Thought possibly secondary to ketoacidosis with reports of poor p.o. intake and/or AKI. - Avoid possible nephrotoxic agents - Check urinalysis - Check urine sodium, urine creatinine, urine urea. - Continue to monitor kidney function  Hypoglycemia Acute.  Initial glucose noted to be 52.  Patient is not on insulin. - Hypoglycemic protocols - CBGs every 4 hours   - Amp of D50 as needed for blood glucoses  Elevated troponin High-sensitivity troponin elevated at 99.  Thought secondary to demand in setting of rapid atrial fibrillation. - Continue to monitor  Nausea, vomiting, and diarrhea Patient reportedly have been having nausea, vomiting, and diarrhea over the last 5 days with decreased p.o. intake.  Influenza, COVID-19, and RSV were negative.  Question the possibility of a  gastroenteritis versus possible C. difficile and recent antibiotics as the cause  for his symptoms. - Continue to monitor intake and output - Check C. difficile and GI panel - Antiemetics as needed - Plan to advance diet once able  Candidal intertrigo - Nystatin  powder  Hyperlipidemia - Resume Crestor  once able  DVT prophylaxis: Resume Eliquis  once able Advance Care Planning:   Code Status: Full Code   Consults: Cardiology  Family Communication: Updated over the phone  Severity of Illness: The appropriate patient status for this patient is INPATIENT. Inpatient status is judged to be reasonable and necessary in order to provide the required intensity of service to ensure the patient's safety. The patient's presenting symptoms, physical exam findings, and initial radiographic and laboratory data in the context of their chronic comorbidities is felt to place them at high risk for further clinical deterioration. Furthermore, it is not anticipated that the patient will be medically stable for discharge from the hospital within 2 midnights of admission.   * I certify that at the point of admission it is my clinical judgment that the patient will require inpatient hospital care spanning beyond 2 midnights from the point of admission due to high intensity of service, high risk for further deterioration and high frequency of surveillance required.*  Author: Maximino DELENA Sharps, MD 01/19/2025 11:42 AM  For on call review [email or web address].      [1]  Allergies Allergen Reactions   Dust Mite Extract Other (See Comments)    Asthma related   "

## 2025-01-19 NOTE — ED Triage Notes (Signed)
 Per [facility] pt coming from home c/o shortness of breath x 5 days and diarrhea x 3 days. Was started on antibiotics 2 days ago. Ems report rhonchi and wheezing. Received 100 NS, 125 solumedrol and 2 duonebs. HR 190s in A-fib RVR. Initial room air sat 71%. BP 90s systolic.

## 2025-01-19 NOTE — ED Provider Notes (Signed)
 "  EMERGENCY DEPARTMENT AT [facility] Provider Note   CSN: [id number] Arrival date & time: 01/19/25  9168     Patient presents with: Shortness of Breath (A-fib RVR)   Mario [name] is a 79 y.o. male.   Patient is a 79 year old male with a history of hypertension, hyperlipidemia, atrial fibrillation on Eliquis , bronchiectasis, obstructive sleep apnea, COPD who presents with shortness of breath and cough.  He said he has felt bad for about 3 to 4 days.  He has had cough which is productive of colored sputum.  He has had worsening shortness of breath.  He has not been using his nebulizer machines because he said he did not have the medication.  He reports subjective fevers but says his actual temperature has not been elevated at home.  He has had some associated nausea vomiting and diarrhea over the last few days.  He has felt like his heart has been faster than normal.  He is compliant with his Eliquis  per his report.  He denies any increased leg swelling.  He denies any associated chest pain.  He was given 2 DuoNebs and 125 of Solu-Medrol  by EMS.  EMS had noted to be hypoxic with oxygen saturation in the 70s on room air.  It improved on oxygen.  His blood pressure was also noted to be low in the 90s.  He was given about 100 cc of IV fluid per EMS.       Prior to Admission medications  Medication Sig Start Date End Date Taking? Authorizing Provider  acetaminophen  (TYLENOL ) 500 MG tablet Take 1,000 mg by mouth every 6 (six) hours as needed for mild pain (pain score 1-3).    [provider]  alendronate  (FOSAMAX ) 70 MG tablet Take 1 tablet (70 mg total) by mouth every 7 (seven) days. Take with a full glass of water  on an empty stomach. 09/06/24   [name] DASEN, MD  budesonide -formoterol  (SYMBICORT ) 160-4.5 MCG/ACT inhaler Inhale 2 puffs into the lungs 2 (two) times daily. 11/23/24   [provider]  diltiazem  (DILACOR XR ) 240 MG 24 hr capsule Take 1 capsule  (240 mg total) by mouth daily. 10/24/24   [name] DASEN, MD  diphenoxylate -atropine  (LOMOTIL ) 2.5-0.025 MG tablet Take 2 tablets by mouth 4 (four) times daily as needed for diarrhea or loose stools. 12/20/24   [name] DASEN, MD  dorzolamide -timolol  (COSOPT ) 2-0.5 % ophthalmic solution Place 1 drop into the right eye daily. 10/26/24   [provider]  ELIQUIS  5 MG TABS tablet TAKE 1 TABLET BY MOUTH TWICE A DAY 08/23/24   Court Dorn PARAS, MD  FIBER COMPLETE PO Take 1 tablet by mouth daily as needed (Constipation).    [provider]  furosemide  (LASIX ) 40 MG tablet Take 1 tablet (40 mg total) by mouth daily. Take additional 20 mg if your weight is 3 lbs in 1 day of 5 lbs in 1 week Patient not taking: Reported on 01/07/2025 10/21/24   [name] DASEN, MD  guaiFENesin  (MUCINEX ) 600 MG 12 hr tablet Take 1 tablet (600 mg total) by mouth 2 (two) times daily. 04/18/24   Tobie Yetta HERO, MD  Iron , Ferrous Sulfate , 325 (65 Fe) MG TABS Take 325 mg by mouth daily. Patient not taking: Reported on 01/07/2025 09/06/24   [name] DASEN, MD  levalbuterol  (XOPENEX  HFA) 45 MCG/ACT inhaler Inhale 2 puffs into the lungs every 6 (six) hours as needed for wheezing. 04/26/24   [name]  T, MD  levalbuterol  (XOPENEX ) 0.63 MG/3ML nebulizer solution Take 3 mLs (0.63 mg total) by nebulization 4 (four) times daily for 7 days, THEN 3 mLs (0.63 mg total) every 6 (six) hours as needed for wheezing or shortness of breath. 10/21/24 01/07/25  [name] DASEN, MD  loperamide (IMODIUM A-D) 2 MG tablet Take 2 mg by mouth 4 (four) times daily as needed for diarrhea or loose stools.    [provider]  metoprolol  succinate (TOPROL -XL) 100 MG 24 hr tablet Take 1 tablet (100 mg total) by mouth daily. Take with or immediately following a meal. 04/26/24   Vicci Rollo SAUNDERS, PA-C  montelukast  (SINGULAIR ) 10 MG tablet TAKE 1 TABLET BY MOUTH EVERY DAY 06/03/24   [name] DASEN, MD  Multiple Vitamins-Minerals  (MULTIVITAMIN WITH MINERALS) tablet Take 1 tablet by mouth daily.    [provider]  pantoprazole  (PROTONIX ) 40 MG tablet TAKE 1 TABLET BY MOUTH TWICE A DAY 06/03/24   [name] DASEN, MD  Respiratory Therapy Supplies (FLUTTER) DEVI Use as directed 07/05/18   Darlean Ozell NOVAK, MD  rosuvastatin  (CRESTOR ) 20 MG tablet TAKE 1 TABLET BY MOUTH EVERY DAY **D/C LIPITOR** 10/21/24   [name] DASEN, MD  sulfamethoxazole -trimethoprim  (BACTRIM  DS) 800-160 MG tablet Take 1 tablet by mouth 2 (two) times daily. 12/24/24   [name] DASEN, MD  tamsulosin  (FLOMAX ) 0.4 MG CAPS capsule Take 1 capsule (0.4 mg total) by mouth daily. 01/03/25   [name] DASEN, MD    Allergies: Dust mite extract    Review of Systems  Constitutional:  Positive for fatigue and fever. Negative for chills and diaphoresis.  HENT:  Negative for congestion, rhinorrhea and sneezing.   Eyes: Negative.   Respiratory:  Positive for cough and shortness of breath. Negative for chest tightness.   Cardiovascular:  Negative for chest pain and leg swelling.  Gastrointestinal:  Positive for diarrhea, nausea and vomiting. Negative for abdominal pain.  Genitourinary:  Negative for difficulty urinating, flank pain and frequency.  Musculoskeletal:  Negative for arthralgias and back pain.  Skin:  Negative for rash.  Neurological:  Negative for dizziness, speech difficulty, weakness, numbness and headaches.    Updated Vital Signs BP 102/82   Pulse (!) 162   Temp 99.3 F (37.4 C) (Oral)   Resp 16   SpO2 97%   Physical Exam Constitutional:      General: He is in acute distress.     Appearance: He is well-developed. He is ill-appearing.  HENT:     Head: Normocephalic and atraumatic.  Eyes:     Pupils: Pupils are equal, round, and reactive to light.  Cardiovascular:     Rate and Rhythm: Normal rate and regular rhythm.     Heart sounds: Normal heart sounds.  Pulmonary:     Effort: Tachypnea and accessory muscle usage present.  No respiratory distress.     Breath sounds: Decreased breath sounds, wheezing and rales present. No rhonchi.  Chest:     Chest wall: No tenderness.  Abdominal:     General: Bowel sounds are normal.     Palpations: Abdomen is soft.     Tenderness: There is no abdominal tenderness. There is no guarding or rebound.  Musculoskeletal:        General: Normal range of motion.     Cervical back: Normal range of motion and neck supple.     Comments: No significant edema or calf tenderness  Lymphadenopathy:     Cervical: No cervical adenopathy.  Skin:    General: Skin is warm and dry.     Findings: No rash.  Neurological:     Mental Status: He is alert and oriented to person, place, and time.     (all labs ordered are listed, but only abnormal results are displayed) Labs Reviewed  PROTIME-INR - Abnormal; Notable for the following components:      Result Value   Prothrombin Time 24.7 (*)    INR 2.1 (*)    All other components within normal limits  CBC WITH DIFFERENTIAL/PLATELET - Abnormal; Notable for the following components:   WBC 25.9 (*)    RDW 17.2 (*)    Neutro Abs 23.6 (*)    Basophils Absolute 0.3 (*)    Abs Immature Granulocytes 0.50 (*)    All other components within normal limits  COMPREHENSIVE METABOLIC PANEL WITH GFR - Abnormal; Notable for the following components:   CO2 13 (*)    Glucose, Bld 52 (*)    BUN 44 (*)    Creatinine, Ser 2.52 (*)    Calcium  8.3 (*)    Total Protein 5.8 (*)    Albumin  2.5 (*)    GFR, Estimated 25 (*)    Anion gap 20 (*)    All other components within normal limits  PRO BRAIN NATRIURETIC PEPTIDE - Abnormal; Notable for the following components:   Pro Brain Natriuretic Peptide 9,529.0 (*)    All other components within normal limits  I-STAT CHEM 8, ED - Abnormal; Notable for the following components:   Sodium 134 (*)    BUN 59 (*)    Creatinine, Ser 2.70 (*)    Glucose, Bld 53 (*)    Calcium , Ion 0.86 (*)    TCO2 14 (*)    All  other components within normal limits  I-STAT ARTERIAL BLOOD GAS, ED - Abnormal; Notable for the following components:   pH, Arterial 7.150 (*)    pO2, Arterial 136 (*)    Bicarbonate 15.0 (*)    TCO2 16 (*)    Acid-base deficit 13.0 (*)    Calcium , Ion 1.09 (*)    All other components within normal limits  TROPONIN T, HIGH SENSITIVITY - Abnormal; Notable for the following components:   Troponin T High Sensitivity 99 (*)    All other components within normal limits  RESP PANEL BY RT-PCR (RSV, FLU A&B, COVID)  RVPGX2  CULTURE, BLOOD (ROUTINE X 2)  CULTURE, BLOOD (ROUTINE X 2)  CBC WITH DIFFERENTIAL/PLATELET  URINALYSIS, W/ REFLEX TO CULTURE (INFECTION SUSPECTED)  PROCALCITONIN  I-STAT CG4 LACTIC ACID, ED  I-STAT CG4 LACTIC ACID, ED  TROPONIN T, HIGH SENSITIVITY    EKG: EKG Interpretation Date/Time:  Sunday January 19 2025 09:26:58 EST Ventricular Rate:  186 PR Interval:    QRS Duration:  77 QT Interval:  264 QTC Calculation: 465 R Axis:   -36  Text Interpretation: Atrial fibrillation with rapid V-rate Inferior infarct, old Consider anterior infarct Lateral leads are also involved Confirmed by Lenor Hollering [phone]) on 01/19/2025 9:34:25 AM  Radiology: [facility] Chest Port 1 View Result Date: 01/19/2025 CLINICAL DATA:  SOB EXAM: PORTABLE CHEST 1 VIEW COMPARISON:  October 21, 2024, May 16, 2024 FINDINGS: The cardiomediastinal silhouette is unchanged and enlarged in contour.Low lung volume radiograph. Favored small bilateral pleural effusions. No pneumothorax. Mild peribronchial cuffing and increased mild interstitial markings. Bibasilar platelike opacities. IMPRESSION: Constellation of findings are favored to reflect pulmonary edema with small bilateral pleural effusions and bibasilar atelectasis. Electronically Signed  By: Corean Salter M.D.   On: 01/19/2025 09:19     Procedures   Medications Ordered in the ED  doxycycline  (VIBRAMYCIN ) 100 mg in sodium chloride  0.9 % 250 mL  IVPB (100 mg Intravenous New Bag/Given 01/19/25 1103)  amiodarone  (NEXTERONE ) 1.8 mg/mL load via infusion 150 mg (150 mg Intravenous Bolus from Bag 01/19/25 1104)    Followed by  amiodarone  (NEXTERONE  PREMIX) 360-4.14 MG/200ML-% (1.8 mg/mL) IV infusion (60 mg/hr Intravenous New Bag/Given 01/19/25 1104)    Followed by  amiodarone  (NEXTERONE  PREMIX) 360-4.14 MG/200ML-% (1.8 mg/mL) IV infusion (has no administration in time range)  sodium chloride  flush (NS) 0.9 % injection 3 mL (has no administration in time range)  dextrose  50 % solution 50 mL (has no administration in time range)  acetaminophen  (TYLENOL ) tablet 650 mg (has no administration in time range)    Or  acetaminophen  (TYLENOL ) suppository 650 mg (has no administration in time range)  albuterol  (PROVENTIL ) (2.5 MG/3ML) 0.083% nebulizer solution 2.5 mg (has no administration in time range)  sodium chloride  0.9 % bolus 500 mL (0 mLs Intravenous Stopped 01/19/25 0927)  ipratropium-albuterol  (DUONEB) 0.5-2.5 (3) MG/3ML nebulizer solution (3 mLs  Given 01/19/25 0901)  sodium chloride  0.9 % bolus 500 mL (0 mLs Intravenous Stopped 01/19/25 1009)  metoprolol  tartrate (LOPRESSOR ) injection 5 mg (5 mg Intravenous Given 01/19/25 0951)  cefTRIAXone  (ROCEPHIN ) 2 g in sodium chloride  0.9 % 100 mL IVPB (0 g Intravenous Stopped 01/19/25 1052)  lactated ringers  bolus 500 mL (500 mLs Intravenous New Bag/Given 01/19/25 1023)  furosemide  (LASIX ) injection 60 mg (60 mg Intravenous Given 01/19/25 1149)                                    Medical Decision Making Amount and/or Complexity of Data Reviewed Labs: ordered. Radiology: ordered.  Risk Prescription drug management. Decision regarding hospitalization.   This patient presents to the ED for concern of shortness of breath, hypoxia, this involves an extensive number of treatment options, and is a complaint that carries with it a high risk of complications and morbidity.  I considered the following differential and  admission for this acute, potentially life threatening condition.  The differential diagnosis includes pneumonia, pulmonary edema, COPD exacerbation, PE, ACS, pneumothorax  MDM:    Patient is a 79 year old who presents with shortness of breath and cough.  He has had subjective fevers at home.  His temperature is 99.3 here.  He is noted to be markedly tachycardic and appears to be in A-fib with RVR.  He has hypoxia and increased work of breathing.  He has diffuse rales and wheezing throughout his lungs.  He has a productive cough.  He was started on BiPAP on arrival to the ED.  His EKG shows A-fib with RVR.  He has had ongoing vomiting and diarrhea and I suspect that he is dehydrated.  I reviewed his records and his last echo in December showed a normal EF.  He was started on IV fluid resuscitation.  This did not really change his heart rate although he was given nebulizer treatments as well which probably contributed to his tachycardia.  He was given 1 dose of IV metoprolol .  However then his blood pressure dropped a little bit lower than it had been.  He remains awake and oriented.  I do not think he will tolerate a diltiazem  drip.  He does say he has been compliant with  Eliquis  although I am a bit reluctant to do a cardioversion given that he has all these other issues going on and likely would not stay in the sinus rhythm.  Discussed with Dr. Debera with cardiology.  He recommends starting Amio.  His chest x-ray shows diffuse pulmonary markings concerning for edema although given his clinical picture, I suspect this is more infectious rather than pulmonary edema related to heart failure.  He is still awaiting a BMP.  However his white count is markedly elevated.  Was started on septic protocol with antibiotics for pneumonia.  His other labs are concerning for an AKI.  His BNP did come back markedly elevated.  He was given a dose of IV Lasix .  Discussed with Dr. Claudene who will admit the patient for further  treatment.  CRITICAL CARE Performed by: Andrea Ness Total critical care time: 90 minutes Critical care time was exclusive of separately billable procedures and treating other patients. Critical care was necessary to treat or prevent imminent or life-threatening deterioration. Critical care was time spent personally by me on the following activities: development of treatment plan with patient and/or surrogate as well as nursing, discussions with consultants, evaluation of patient's response to treatment, examination of patient, obtaining history from patient or surrogate, ordering and performing treatments and interventions, ordering and review of laboratory studies, ordering and review of radiographic studies, pulse oximetry and re-evaluation of patient's condition.   (Labs, imaging, consults)  Labs: I Ordered, and personally interpreted labs.  The pertinent results include: Elevated WBC count, elevated creatinine  Imaging Studies ordered: I ordered imaging studies including chest x-ray I independently visualized and interpreted imaging. I agree with the radiologist interpretation  Additional history obtained from EMS.  External records from outside source obtained and reviewed including prior notes  Cardiac Monitoring: The patient was maintained on a cardiac monitor.  If on the cardiac monitor, I personally viewed and interpreted the cardiac monitored which showed an underlying rhythm of: Atrial fibrillation with RVR  Reevaluation: After the interventions noted above, I reevaluated the patient and found that they have :improved  Social Determinants of Health:    Disposition: Admit to hospital  Co morbidities that complicate the patient evaluation  Past Medical History:  Diagnosis Date   Arrhythmia    Arthritis    Asthma    Atrial fibrillation (HCC)    Biliary colic 2014   Bronchiectasis (HCC)    2021   Bronchiectasis (HCC) 11/18/2024   Cataract    COPD (chronic  obstructive pulmonary disease) with chronic bronchitis (HCC)    Coronary artery calcification seen on CAT scan    Cyst of pancreas    1 cm (7/18), repeat ct in 12 months.     Hyperlipidemia    Hypertension    Obstructive sleep apnea    Pneumonia 10/24/2024   left lower lobe   Renal disorder    Rhinitis    Sleep apnea    pt denies sleep apnea-lost weight     Medicines Meds ordered this encounter  Medications   sodium chloride  0.9 % bolus 500 mL   ipratropium-albuterol  (DUONEB) 0.5-2.5 (3) MG/3ML nebulizer solution    Cardwell, Katie R: cabinet override   sodium chloride  0.9 % bolus 500 mL   metoprolol  tartrate (LOPRESSOR ) injection 5 mg   DISCONTD: lactated ringers  infusion   cefTRIAXone  (ROCEPHIN ) 2 g in sodium chloride  0.9 % 100 mL IVPB    Antibiotic Indication::   CAP   DISCONTD: azithromycin  (ZITHROMAX ) 500 mg in  sodium chloride  0.9 % 250 mL IVPB    Antibiotic Indication::   CAP   doxycycline  (VIBRAMYCIN ) 100 mg in sodium chloride  0.9 % 250 mL IVPB    Antibiotic Indication::   CAP   lactated ringers  bolus 500 mL   FOLLOWED BY Linked Order Group    amiodarone  (NEXTERONE ) 1.8 mg/mL load via infusion 150 mg    amiodarone  (NEXTERONE  PREMIX) 360-4.14 MG/200ML-% (1.8 mg/mL) IV infusion    amiodarone  (NEXTERONE  PREMIX) 360-4.14 MG/200ML-% (1.8 mg/mL) IV infusion   DISCONTD: furosemide  (LASIX ) injection 80 mg   furosemide  (LASIX ) injection 60 mg   sodium chloride  flush (NS) 0.9 % injection 3 mL   dextrose  50 % solution 50 mL   OR Linked Order Group    acetaminophen  (TYLENOL ) tablet 650 mg    acetaminophen  (TYLENOL ) suppository 650 mg   albuterol  (PROVENTIL ) (2.5 MG/3ML) 0.083% nebulizer solution 2.5 mg    I have reviewed the patients home medicines and have made adjustments as needed  Problem List / ED Course: Problem List Items Addressed This Visit       Cardiovascular and Mediastinum   Atrial fibrillation with RVR (HCC)   Relevant Medications   amiodarone  (NEXTERONE   PREMIX) 360-4.14 MG/200ML-% (1.8 mg/mL) IV infusion   amiodarone  (NEXTERONE  PREMIX) 360-4.14 MG/200ML-% (1.8 mg/mL) IV infusion (Start on 01/19/2025  4:45 PM)     Genitourinary   AKI (acute kidney injury)   Other Visit Diagnoses       Acute on chronic respiratory failure with hypoxia (HCC)    -  Primary     Sepsis, due to unspecified organism, unspecified whether acute organ dysfunction present [facility])                    Final diagnoses:  Acute on chronic respiratory failure with hypoxia (HCC)  Atrial fibrillation with RVR (HCC)  Sepsis, due to unspecified organism, unspecified whether acute organ dysfunction present [facility])  AKI (acute kidney injury)    ED Discharge Orders     None          Lenor Hollering, MD 01/19/25 1226  "

## 2025-01-20 ENCOUNTER — Inpatient Hospital Stay (HOSPITAL_COMMUNITY)

## 2025-01-20 DIAGNOSIS — N179 Acute kidney failure, unspecified: Secondary | ICD-10-CM

## 2025-01-20 DIAGNOSIS — I5021 Acute systolic (congestive) heart failure: Secondary | ICD-10-CM

## 2025-01-20 DIAGNOSIS — J9621 Acute and chronic respiratory failure with hypoxia: Secondary | ICD-10-CM | POA: Diagnosis not present

## 2025-01-20 DIAGNOSIS — J9601 Acute respiratory failure with hypoxia: Secondary | ICD-10-CM | POA: Diagnosis not present

## 2025-01-20 DIAGNOSIS — I4891 Unspecified atrial fibrillation: Secondary | ICD-10-CM | POA: Diagnosis not present

## 2025-01-20 LAB — URINALYSIS, W/ REFLEX TO CULTURE (INFECTION SUSPECTED)
Bilirubin Urine: NEGATIVE
Glucose, UA: NEGATIVE mg/dL
Ketones, ur: NEGATIVE mg/dL
Nitrite: NEGATIVE
Protein, ur: >=300 mg/dL — AB
Specific Gravity, Urine: 1.011 (ref 1.005–1.030)
pH: 5 (ref 5.0–8.0)

## 2025-01-20 LAB — ECHOCARDIOGRAM LIMITED
Area-P 1/2: 5.6 cm2
Calc EF: 50.6 %
Height: 69 in
S' Lateral: 2.5 cm
Single Plane A2C EF: 51 %
Single Plane A4C EF: 46.6 %
Weight: 3200 [oz_av]

## 2025-01-20 LAB — APTT: aPTT: 52 s — ABNORMAL HIGH (ref 24–36)

## 2025-01-20 LAB — CBC
HCT: 40.4 % (ref 39.0–52.0)
Hemoglobin: 12.8 g/dL — ABNORMAL LOW (ref 13.0–17.0)
MCH: 30.2 pg (ref 26.0–34.0)
MCHC: 31.7 g/dL (ref 30.0–36.0)
MCV: 95.3 fL (ref 80.0–100.0)
Platelets: 308 10*3/uL (ref 150–400)
RBC: 4.24 MIL/uL (ref 4.22–5.81)
RDW: 17.3 % — ABNORMAL HIGH (ref 11.5–15.5)
WBC: 17.2 10*3/uL — ABNORMAL HIGH (ref 4.0–10.5)
nRBC: 0.2 % (ref 0.0–0.2)

## 2025-01-20 LAB — GLUCOSE, CAPILLARY
Glucose-Capillary: 142 mg/dL — ABNORMAL HIGH (ref 70–99)
Glucose-Capillary: 144 mg/dL — ABNORMAL HIGH (ref 70–99)
Glucose-Capillary: 144 mg/dL — ABNORMAL HIGH (ref 70–99)
Glucose-Capillary: 178 mg/dL — ABNORMAL HIGH (ref 70–99)
Glucose-Capillary: 193 mg/dL — ABNORMAL HIGH (ref 70–99)
Glucose-Capillary: 147 mg/dL — ABNORMAL HIGH (ref 70–99)

## 2025-01-20 LAB — CREATININE, URINE, RANDOM: Creatinine, Urine: 39 mg/dL

## 2025-01-20 LAB — BASIC METABOLIC PANEL WITH GFR
Anion gap: 18 — ABNORMAL HIGH (ref 5–15)
BUN: 50 mg/dL — ABNORMAL HIGH (ref 8–23)
CO2: 16 mmol/L — ABNORMAL LOW (ref 22–32)
Calcium: 7.7 mg/dL — ABNORMAL LOW (ref 8.9–10.3)
Chloride: 102 mmol/L (ref 98–111)
Creatinine, Ser: 3.15 mg/dL — ABNORMAL HIGH (ref 0.61–1.24)
GFR, Estimated: 19 mL/min — ABNORMAL LOW
Glucose, Bld: 151 mg/dL — ABNORMAL HIGH (ref 70–99)
Potassium: 4.4 mmol/L (ref 3.5–5.1)
Sodium: 137 mmol/L (ref 135–145)

## 2025-01-20 LAB — SODIUM, URINE, RANDOM: Sodium, Ur: 68 mmol/L

## 2025-01-20 LAB — HEPARIN LEVEL (UNFRACTIONATED): Heparin Unfractionated: >1.1 [IU]/mL — ABNORMAL HIGH (ref 0.30–0.70)

## 2025-01-20 MED ORDER — METOPROLOL TARTRATE 25 MG PO TABS
25.0000 mg | ORAL_TABLET | Freq: Four times a day (QID) | ORAL | Status: AC
  Administered 2025-01-20 – 2025-01-24 (×14): 25 mg via ORAL
  Filled 2025-01-20 (×17): qty 1

## 2025-01-20 MED ORDER — ACETAMINOPHEN 10 MG/ML IV SOLN
1000.0000 mg | Freq: Once | INTRAVENOUS | Status: AC
  Administered 2025-01-20: 1000 mg via INTRAVENOUS
  Filled 2025-01-20: qty 100

## 2025-01-20 MED ORDER — LEVALBUTEROL HCL 0.63 MG/3ML IN NEBU
0.6300 mg | INHALATION_SOLUTION | Freq: Four times a day (QID) | RESPIRATORY_TRACT | Status: DC
  Administered 2025-01-20 (×2): 0.63 mg via RESPIRATORY_TRACT
  Filled 2025-01-20 (×2): qty 3

## 2025-01-20 MED ORDER — SODIUM CHLORIDE 0.9 % IV BOLUS: 1000.0000 mL | Freq: Once | INTRAVENOUS | Status: DC

## 2025-01-20 MED ORDER — FUROSEMIDE 10 MG/ML IJ SOLN
60.0000 mg | Freq: Once | INTRAMUSCULAR | Status: AC
  Administered 2025-01-20: 60 mg via INTRAVENOUS
  Filled 2025-01-20: qty 6

## 2025-01-20 MED ORDER — METOPROLOL TARTRATE 25 MG PO TABS
25.0000 mg | ORAL_TABLET | Freq: Two times a day (BID) | ORAL | Status: DC
  Filled 2025-01-20: qty 1

## 2025-01-20 MED ORDER — METOPROLOL SUCCINATE ER 50 MG PO TB24: 50.0000 mg | ORAL_TABLET | Freq: Every day | ORAL | Status: DC

## 2025-01-20 MED ORDER — AMIODARONE LOAD VIA INFUSION
150.0000 mg | Freq: Once | INTRAVENOUS | Status: AC
  Administered 2025-01-20: 150 mg via INTRAVENOUS
  Filled 2025-01-20: qty 83.34

## 2025-01-20 MED ORDER — METOPROLOL TARTRATE 5 MG/5ML IV SOLN
2.5000 mg | Freq: Once | INTRAVENOUS | Status: AC
  Administered 2025-01-20: 2.5 mg via INTRAVENOUS
  Filled 2025-01-20: qty 5

## 2025-01-20 MED ORDER — METOPROLOL TARTRATE 50 MG PO TABS
50.0000 mg | ORAL_TABLET | Freq: Two times a day (BID) | ORAL | Status: DC
  Administered 2025-01-20: 50 mg via ORAL
  Filled 2025-01-20: qty 1

## 2025-01-20 MED ORDER — APIXABAN 5 MG PO TABS
5.0000 mg | ORAL_TABLET | Freq: Two times a day (BID) | ORAL | Status: AC
  Administered 2025-01-20 – 2025-01-24 (×10): 5 mg via ORAL
  Filled 2025-01-20 (×10): qty 1

## 2025-01-20 MED ORDER — SODIUM CHLORIDE 0.9 % IV BOLUS
500.0000 mL | Freq: Once | INTRAVENOUS | Status: AC
  Administered 2025-01-20: 500 mL via INTRAVENOUS

## 2025-01-20 MED ORDER — AMIODARONE IV BOLUS ONLY 150 MG/100ML: 150.0000 mg | Freq: Once | INTRAVENOUS | Status: DC

## 2025-01-20 MED ORDER — HEPARIN (PORCINE) 25000 UT/250ML-% IV SOLN
1300.0000 [IU]/h | INTRAVENOUS | Status: DC
  Administered 2025-01-20: 1300 [IU]/h via INTRAVENOUS
  Filled 2025-01-20: qty 250

## 2025-01-20 NOTE — Plan of Care (Signed)
  Problem: Education: Goal: Ability to describe self-care measures that may prevent or decrease complications (Diabetes Survival Skills Education) will improve Outcome: Progressing Goal: Individualized Educational Video(s) Outcome: Progressing   Problem: Fluid Volume: Goal: Ability to maintain a balanced intake and output will improve Outcome: Progressing   Problem: Coping: Goal: Ability to adjust to condition or change in health will improve Outcome: Progressing

## 2025-01-20 NOTE — Progress Notes (Signed)
 Patient has received 3000 ML LR in last 14hr. Patient has onlt urinated 30 mL in the last 8 hours. Bladder scan revealed >837 mL  Franky made aware.see new orders

## 2025-01-20 NOTE — Progress Notes (Signed)
 Upon waking up patient patient was disoriented. Patient was updated about why he is in the hospital and what the plan of can if for him so far.

## 2025-01-20 NOTE — Plan of Care (Signed)
   Problem: Education: Goal: Knowledge of General Education information will improve Description: Including pain rating scale, medication(s)/side effects and non-pharmacologic comfort measures Outcome: Progressing   Problem: Clinical Measurements: Goal: Respiratory complications will improve Outcome: Progressing   Problem: Coping: Goal: Level of anxiety will decrease Outcome: Progressing   Problem: Pain Managment: Goal: General experience of comfort will improve and/or be controlled Outcome: Progressing   Problem: Safety: Goal: Ability to remain free from injury will improve Outcome: Progressing

## 2025-01-20 NOTE — Progress Notes (Addendum)
 Patient HR is sustaining in the 140/150s. Patient continue to be on Bi-pap at 40%. Tolerating well. BP is soft 98/85 (90). Nasir MD made aware of vitals. No new orders at this time

## 2025-01-20 NOTE — Progress Notes (Signed)
 Patient HR sustaining in 150s. Nasir MD notified. See new orders

## 2025-01-21 ENCOUNTER — Telehealth (HOSPITAL_COMMUNITY): Payer: Self-pay

## 2025-01-21 ENCOUNTER — Other Ambulatory Visit: Payer: Self-pay | Admitting: Allergy and Immunology

## 2025-01-21 ENCOUNTER — Encounter (HOSPITAL_COMMUNITY)

## 2025-01-21 ENCOUNTER — Other Ambulatory Visit (HOSPITAL_COMMUNITY): Payer: Self-pay

## 2025-01-21 DIAGNOSIS — I5021 Acute systolic (congestive) heart failure: Secondary | ICD-10-CM | POA: Diagnosis not present

## 2025-01-21 DIAGNOSIS — I4891 Unspecified atrial fibrillation: Secondary | ICD-10-CM | POA: Diagnosis not present

## 2025-01-21 DIAGNOSIS — J9621 Acute and chronic respiratory failure with hypoxia: Secondary | ICD-10-CM | POA: Diagnosis not present

## 2025-01-21 DIAGNOSIS — J4489 Other specified chronic obstructive pulmonary disease: Secondary | ICD-10-CM

## 2025-01-21 DIAGNOSIS — R0602 Shortness of breath: Secondary | ICD-10-CM

## 2025-01-21 LAB — GLUCOSE, CAPILLARY
Glucose-Capillary: 120 mg/dL — ABNORMAL HIGH (ref 70–99)
Glucose-Capillary: 132 mg/dL — ABNORMAL HIGH (ref 70–99)
Glucose-Capillary: 132 mg/dL — ABNORMAL HIGH (ref 70–99)
Glucose-Capillary: 139 mg/dL — ABNORMAL HIGH (ref 70–99)
Glucose-Capillary: 143 mg/dL — ABNORMAL HIGH (ref 70–99)
Glucose-Capillary: 144 mg/dL — ABNORMAL HIGH (ref 70–99)

## 2025-01-21 LAB — CBC
HCT: 38.5 % — ABNORMAL LOW (ref 39.0–52.0)
Hemoglobin: 12.5 g/dL — ABNORMAL LOW (ref 13.0–17.0)
MCH: 29.9 pg (ref 26.0–34.0)
MCHC: 32.5 g/dL (ref 30.0–36.0)
MCV: 92.1 fL (ref 80.0–100.0)
Platelets: 297 10*3/uL (ref 150–400)
RBC: 4.18 MIL/uL — ABNORMAL LOW (ref 4.22–5.81)
RDW: 17.2 % — ABNORMAL HIGH (ref 11.5–15.5)
WBC: 20.8 10*3/uL — ABNORMAL HIGH (ref 4.0–10.5)
nRBC: 0.8 % — ABNORMAL HIGH (ref 0.0–0.2)

## 2025-01-21 LAB — BASIC METABOLIC PANEL WITH GFR
Anion gap: 18 — ABNORMAL HIGH (ref 5–15)
BUN: 60 mg/dL — ABNORMAL HIGH (ref 8–23)
CO2: 15 mmol/L — ABNORMAL LOW (ref 22–32)
Calcium: 7.1 mg/dL — ABNORMAL LOW (ref 8.9–10.3)
Chloride: 100 mmol/L (ref 98–111)
Creatinine, Ser: 3.88 mg/dL — ABNORMAL HIGH (ref 0.61–1.24)
GFR, Estimated: 15 mL/min — ABNORMAL LOW
Glucose, Bld: 153 mg/dL — ABNORMAL HIGH (ref 70–99)
Potassium: 4.3 mmol/L (ref 3.5–5.1)
Sodium: 133 mmol/L — ABNORMAL LOW (ref 135–145)

## 2025-01-21 LAB — URINE CULTURE: Culture: NO GROWTH

## 2025-01-21 LAB — UREA NITROGEN, URINE: Urea Nitrogen, Ur: 168 mg/dL

## 2025-01-21 LAB — HEPARIN LEVEL (UNFRACTIONATED): Heparin Unfractionated: >1.1 [IU]/mL — ABNORMAL HIGH (ref 0.30–0.70)

## 2025-01-21 LAB — C DIFFICILE QUICK SCREEN W PCR REFLEX
C Diff antigen: POSITIVE — AB
C Diff interpretation: DETECTED
C Diff toxin: POSITIVE — AB

## 2025-01-21 LAB — MAGNESIUM: Magnesium: 1.9 mg/dL (ref 1.7–2.4)

## 2025-01-21 LAB — APTT: aPTT: 47 s — ABNORMAL HIGH (ref 24–36)

## 2025-01-21 MED ORDER — FUROSEMIDE 10 MG/ML IJ SOLN: 80.0000 mg | Freq: Two times a day (BID) | INTRAMUSCULAR | Status: DC

## 2025-01-21 MED ORDER — FUROSEMIDE 10 MG/ML IJ SOLN
80.0000 mg | Freq: Two times a day (BID) | INTRAMUSCULAR | Status: DC
  Administered 2025-01-21: 80 mg via INTRAVENOUS
  Filled 2025-01-21: qty 8

## 2025-01-21 MED ORDER — DOXYCYCLINE HYCLATE 100 MG PO TABS
100.0000 mg | ORAL_TABLET | Freq: Two times a day (BID) | ORAL | Status: DC
  Administered 2025-01-21 – 2025-01-22 (×3): 100 mg via ORAL
  Filled 2025-01-21 (×3): qty 1

## 2025-01-21 MED ORDER — FUROSEMIDE 10 MG/ML IJ SOLN: 20.0000 mg/h | INTRAVENOUS | Status: DC

## 2025-01-21 MED ORDER — PREDNISONE 20 MG PO TABS
40.0000 mg | ORAL_TABLET | Freq: Every day | ORAL | Status: DC
  Administered 2025-01-22: 40 mg via ORAL
  Filled 2025-01-21: qty 2

## 2025-01-21 MED ORDER — HALOPERIDOL LACTATE 5 MG/ML IJ SOLN: 1.0000 mg | Freq: Four times a day (QID) | INTRAMUSCULAR | Status: DC | PRN

## 2025-01-21 MED ORDER — ATORVASTATIN CALCIUM 40 MG PO TABS
40.0000 mg | ORAL_TABLET | Freq: Every day | ORAL | Status: AC
  Administered 2025-01-21 – 2025-01-24 (×4): 40 mg via ORAL
  Filled 2025-01-21 (×4): qty 1

## 2025-01-21 MED ORDER — TAMSULOSIN HCL 0.4 MG PO CAPS
0.4000 mg | ORAL_CAPSULE | Freq: Every day | ORAL | Status: AC
  Administered 2025-01-21 – 2025-01-24 (×4): 0.4 mg via ORAL
  Filled 2025-01-21 (×4): qty 1

## 2025-01-21 MED ORDER — CHLORHEXIDINE GLUCONATE CLOTH 2 % EX PADS
6.0000 | MEDICATED_PAD | Freq: Every day | CUTANEOUS | Status: AC
  Administered 2025-01-21 – 2025-01-24 (×4): 6 via TOPICAL

## 2025-01-21 MED ORDER — FIDAXOMICIN 200 MG PO TABS
200.0000 mg | ORAL_TABLET | Freq: Two times a day (BID) | ORAL | Status: DC
  Administered 2025-01-21 – 2025-01-22 (×3): 200 mg via ORAL
  Filled 2025-01-21 (×4): qty 1

## 2025-01-21 MED ORDER — PANTOPRAZOLE SODIUM 40 MG PO TBEC
40.0000 mg | DELAYED_RELEASE_TABLET | Freq: Two times a day (BID) | ORAL | Status: AC
  Administered 2025-01-21 – 2025-01-24 (×8): 40 mg via ORAL
  Filled 2025-01-21 (×8): qty 1

## 2025-01-21 MED ORDER — ALBUTEROL SULFATE (2.5 MG/3ML) 0.083% IN NEBU
2.5000 mg | INHALATION_SOLUTION | Freq: Once | RESPIRATORY_TRACT | Status: AC
  Administered 2025-01-21: 2.5 mg via RESPIRATORY_TRACT

## 2025-01-21 MED ORDER — FUROSEMIDE 10 MG/ML IJ SOLN
120.0000 mg | Freq: Two times a day (BID) | INTRAVENOUS | Status: DC
  Administered 2025-01-21: 120 mg via INTRAVENOUS
  Filled 2025-01-21: qty 120
  Filled 2025-01-21 (×2): qty 12

## 2025-01-21 NOTE — Progress Notes (Signed)
 Patient shows no signs of respiratory distress at this time. Sp02=100% on 2lpm, bipap in room on standby.

## 2025-01-21 NOTE — Telephone Encounter (Addendum)
 Pharmacy Patient Advocate Encounter  Insurance verification completed.    The patient is insured through [employer]. Patient has Medicare and is not eligible for a copay card, but may be able to apply for patient assistance or Medicare RX Payment Plan (Patient Must reach out to their plan, if eligible for payment plan), if available.    Ran test claim for Fidaxomicin  200mg  tablets  and the current 30 day co-pay is $747.58.  Ran test claim for Vancomycin  125mg  capsules  and the current 30 day co-pay is $13.81.  This test claim was processed through [facility]- copay amounts may vary at other pharmacies due to pharmacy/plan contracts, or as the patient moves through the different stages of their insurance plan.

## 2025-01-22 ENCOUNTER — Encounter (HOSPITAL_COMMUNITY): Admission: EM | Payer: Self-pay | Source: Home / Self Care | Attending: Internal Medicine

## 2025-01-22 ENCOUNTER — Inpatient Hospital Stay (HOSPITAL_COMMUNITY)

## 2025-01-22 ENCOUNTER — Other Ambulatory Visit (HOSPITAL_COMMUNITY): Payer: Self-pay

## 2025-01-22 ENCOUNTER — Inpatient Hospital Stay (HOSPITAL_COMMUNITY): Admitting: Anesthesiology

## 2025-01-22 DIAGNOSIS — J471 Bronchiectasis with (acute) exacerbation: Secondary | ICD-10-CM | POA: Diagnosis not present

## 2025-01-22 DIAGNOSIS — J9621 Acute and chronic respiratory failure with hypoxia: Secondary | ICD-10-CM | POA: Diagnosis not present

## 2025-01-22 DIAGNOSIS — E782 Mixed hyperlipidemia: Secondary | ICD-10-CM | POA: Diagnosis not present

## 2025-01-22 DIAGNOSIS — R6510 Systemic inflammatory response syndrome (SIRS) of non-infectious origin without acute organ dysfunction: Secondary | ICD-10-CM | POA: Diagnosis not present

## 2025-01-22 DIAGNOSIS — A0472 Enterocolitis due to Clostridium difficile, not specified as recurrent: Secondary | ICD-10-CM | POA: Diagnosis not present

## 2025-01-22 DIAGNOSIS — A419 Sepsis, unspecified organism: Secondary | ICD-10-CM

## 2025-01-22 DIAGNOSIS — I5023 Acute on chronic systolic (congestive) heart failure: Secondary | ICD-10-CM | POA: Diagnosis not present

## 2025-01-22 DIAGNOSIS — J9601 Acute respiratory failure with hypoxia: Secondary | ICD-10-CM | POA: Diagnosis not present

## 2025-01-22 DIAGNOSIS — I4891 Unspecified atrial fibrillation: Secondary | ICD-10-CM | POA: Diagnosis not present

## 2025-01-22 DIAGNOSIS — B372 Candidiasis of skin and nail: Secondary | ICD-10-CM | POA: Diagnosis not present

## 2025-01-22 DIAGNOSIS — N179 Acute kidney failure, unspecified: Secondary | ICD-10-CM | POA: Diagnosis not present

## 2025-01-22 LAB — ACID FAST CULTURE WITH REFLEXED SENSITIVITIES (MYCOBACTERIA)
Acid Fast Culture: NEGATIVE
Acid Fast Culture: NEGATIVE

## 2025-01-22 LAB — BASIC METABOLIC PANEL WITH GFR
Anion gap: 19 — ABNORMAL HIGH (ref 5–15)
BUN: 71 mg/dL — ABNORMAL HIGH (ref 8–23)
CO2: 16 mmol/L — ABNORMAL LOW (ref 22–32)
Calcium: 7.1 mg/dL — ABNORMAL LOW (ref 8.9–10.3)
Chloride: 100 mmol/L (ref 98–111)
Creatinine, Ser: 4.13 mg/dL — ABNORMAL HIGH (ref 0.61–1.24)
GFR, Estimated: 14 mL/min — ABNORMAL LOW
Glucose, Bld: 130 mg/dL — ABNORMAL HIGH (ref 70–99)
Potassium: 4.1 mmol/L (ref 3.5–5.1)
Sodium: 134 mmol/L — ABNORMAL LOW (ref 135–145)

## 2025-01-22 LAB — POCT I-STAT EG7
Acid-base deficit: 8 mmol/L — ABNORMAL HIGH (ref 0.0–2.0)
Acid-base deficit: 9 mmol/L — ABNORMAL HIGH (ref 0.0–2.0)
Bicarbonate: 19 mmol/L — ABNORMAL LOW (ref 20.0–28.0)
Bicarbonate: 19.4 mmol/L — ABNORMAL LOW (ref 20.0–28.0)
Calcium, Ion: 0.9 mmol/L — ABNORMAL LOW (ref 1.15–1.40)
Calcium, Ion: 0.94 mmol/L — ABNORMAL LOW (ref 1.15–1.40)
HCT: 39 % (ref 39.0–52.0)
HCT: 40 % (ref 39.0–52.0)
Hemoglobin: 13.3 g/dL (ref 13.0–17.0)
Hemoglobin: 13.6 g/dL (ref 13.0–17.0)
O2 Saturation: 69 %
O2 Saturation: 69 %
Potassium: 3.9 mmol/L (ref 3.5–5.1)
Potassium: 4 mmol/L (ref 3.5–5.1)
Sodium: 135 mmol/L (ref 135–145)
Sodium: 137 mmol/L (ref 135–145)
TCO2: 20 mmol/L — ABNORMAL LOW (ref 22–32)
TCO2: 21 mmol/L — ABNORMAL LOW (ref 22–32)
pCO2, Ven: 45.9 mmHg (ref 44–60)
pCO2, Ven: 46.6 mmHg (ref 44–60)
pH, Ven: 7.225 — ABNORMAL LOW (ref 7.25–7.43)
pH, Ven: 7.226 — ABNORMAL LOW (ref 7.25–7.43)
pO2, Ven: 43 mmHg (ref 32–45)
pO2, Ven: 43 mmHg (ref 32–45)

## 2025-01-22 LAB — GASTROINTESTINAL PANEL BY PCR, STOOL (REPLACES STOOL CULTURE)

## 2025-01-22 LAB — GLUCOSE, CAPILLARY
Glucose-Capillary: 120 mg/dL — ABNORMAL HIGH (ref 70–99)
Glucose-Capillary: 125 mg/dL — ABNORMAL HIGH (ref 70–99)
Glucose-Capillary: 124 mg/dL — ABNORMAL HIGH (ref 70–99)
Glucose-Capillary: 160 mg/dL — ABNORMAL HIGH (ref 70–99)

## 2025-01-22 LAB — ECHO TEE

## 2025-01-22 LAB — MAGNESIUM: Magnesium: 1.9 mg/dL (ref 1.7–2.4)

## 2025-01-22 MED ORDER — HYDRALAZINE HCL 20 MG/ML IJ SOLN: 10.0000 mg | INTRAMUSCULAR | Status: AC | PRN

## 2025-01-22 MED ORDER — SODIUM CHLORIDE 0.9 % IV SOLN: INTRAVENOUS | Status: DC

## 2025-01-22 MED ORDER — SODIUM CHLORIDE 0.9 % IV BOLUS
500.0000 mL | Freq: Once | INTRAVENOUS | Status: AC
  Administered 2025-01-22: 500 mL via INTRAVENOUS

## 2025-01-22 MED ORDER — PROPOFOL 10 MG/ML IV BOLUS
INTRAVENOUS | Status: DC | PRN
  Administered 2025-01-22 (×2): 30 mg via INTRAVENOUS

## 2025-01-22 MED ORDER — LIDOCAINE HCL (PF) 1 % IJ SOLN
INTRAMUSCULAR | Status: AC
  Filled 2025-01-22: qty 30

## 2025-01-22 MED ORDER — SODIUM CHLORIDE 0.9 % IV BOLUS: 1000.0000 mL | Freq: Once | INTRAVENOUS | Status: DC

## 2025-01-22 MED ORDER — LIDOCAINE 2% (20 MG/ML) 5 ML SYRINGE
INTRAMUSCULAR | Status: DC | PRN
  Administered 2025-01-22: 60 mg via INTRAVENOUS

## 2025-01-22 MED ORDER — ONDANSETRON HCL 4 MG/2ML IJ SOLN: 4.0000 mg | Freq: Four times a day (QID) | INTRAMUSCULAR | Status: AC | PRN

## 2025-01-22 MED ORDER — LABETALOL HCL 5 MG/ML IV SOLN: 10.0000 mg | INTRAVENOUS | Status: AC | PRN

## 2025-01-22 MED ORDER — SODIUM CHLORIDE 0.9% FLUSH
3.0000 mL | Freq: Two times a day (BID) | INTRAVENOUS | Status: DC
  Administered 2025-01-22: 3 mL via INTRAVENOUS

## 2025-01-22 MED ORDER — SODIUM CHLORIDE 0.9% FLUSH: 3.0000 mL | INTRAVENOUS | Status: DC | PRN

## 2025-01-22 MED ORDER — HEPARIN (PORCINE) IN NACL 1000-0.9 UT/500ML-% IV SOLN
INTRAVENOUS | Status: DC | PRN
  Administered 2025-01-22: 500 mL

## 2025-01-22 MED ORDER — VANCOMYCIN HCL 125 MG PO CAPS
125.0000 mg | ORAL_CAPSULE | Freq: Four times a day (QID) | ORAL | Status: AC
  Administered 2025-01-22 – 2025-01-24 (×8): 125 mg via ORAL
  Filled 2025-01-22 (×9): qty 1

## 2025-01-22 MED ORDER — FUROSEMIDE 40 MG PO TABS
40.0000 mg | ORAL_TABLET | Freq: Every day | ORAL | Status: DC
  Administered 2025-01-22: 40 mg via ORAL
  Filled 2025-01-22: qty 1

## 2025-01-22 MED ORDER — LIDOCAINE HCL (PF) 1 % IJ SOLN
INTRAMUSCULAR | Status: DC | PRN
  Administered 2025-01-22: 5 mL
  Administered 2025-01-22: 12 mL

## 2025-01-22 MED ORDER — ACETAMINOPHEN 325 MG PO TABS: 650.0000 mg | ORAL_TABLET | ORAL | Status: AC | PRN

## 2025-01-22 MED ORDER — SODIUM CHLORIDE 0.9 % IV SOLN: 250.0000 mL | INTRAVENOUS | Status: DC | PRN

## 2025-01-22 MED ORDER — PHENYLEPHRINE 80 MCG/ML (10ML) SYRINGE FOR IV PUSH (FOR BLOOD PRESSURE SUPPORT)
PREFILLED_SYRINGE | INTRAVENOUS | Status: DC | PRN
  Administered 2025-01-22: 240 ug via INTRAVENOUS
  Administered 2025-01-22: 120 ug via INTRAVENOUS
  Administered 2025-01-22 (×2): 160 ug via INTRAVENOUS

## 2025-01-22 NOTE — Anesthesia Postprocedure Evaluation (Signed)
"   Anesthesia Post Note  Patient: Mario [name]  Procedure(s) Performed: TRANSESOPHAGEAL ECHOCARDIOGRAM CARDIOVERSION     Patient location during evaluation: PACU Anesthesia Type: MAC Level of consciousness: awake and alert Pain management: pain level controlled Vital Signs Assessment: post-procedure vital signs reviewed and stable Respiratory status: spontaneous breathing, nonlabored ventilation, respiratory function stable and patient connected to nasal cannula oxygen Cardiovascular status: stable and blood pressure returned to baseline Postop Assessment: no apparent nausea or vomiting Anesthetic complications: no   No notable events documented.  Last Vitals:  Vitals:   01/22/25 1208 01/22/25 1213  BP: 101/60 101/60  Pulse: 70 74  Resp: 19 19  Temp:    SpO2: 98% 96%    Last Pain:  Vitals:   01/22/25 1206  TempSrc:   PainSc: 0-No pain                 Amberlyn Martinezgarcia      "

## 2025-01-22 NOTE — Interval H&P Note (Signed)
 History and Physical Interval Note:  01/22/2025 11:24 AM  Mario [name]  has presented today for surgery, with the diagnosis of Heart Failure.  The various methods of treatment have been discussed with the patient and family. After consideration of risks, benefits and other options for treatment, the patient has consented to  Procedures: RIGHT HEART CATH (N/A) as a surgical intervention.  The patient's history has been reviewed, patient examined, no change in status, stable for surgery.  I have reviewed the patient's chart and labs.  Questions were answered to the patient's satisfaction.     [name] [employer]

## 2025-01-22 NOTE — Progress Notes (Signed)
 OT Cancellation Note  Patient Details Name: Mario [name] MRN: [id number] DOB: [birth year]   Cancelled Treatment:    Reason Eval/Treat Not Completed: (P) Medical issues which prohibited therapy, Cardioversion, on bed rest until later this afternoon, will return as time allows.  Elouise JONELLE Bott 01/22/2025, 2:34 PM

## 2025-01-22 NOTE — Transfer of Care (Signed)
 Immediate Anesthesia Transfer of Care Note  Patient: Mario [name]  Procedure(s) Performed: TRANSESOPHAGEAL ECHOCARDIOGRAM CARDIOVERSION  Patient Location: PACU  Anesthesia Type:MAC  Level of Consciousness: drowsy  Airway & Oxygen Therapy: Patient Spontanous Breathing  Post-op Assessment: Report given to RN  Post vital signs: Reviewed and stable  Last Vitals:  Vitals Value Taken Time  BP    Temp    Pulse    Resp    SpO2      Last Pain:  Vitals:   01/22/25 0804  TempSrc: Oral  PainSc:       Patients Stated Pain Goal: 0 (01/21/25 1940)  Complications: No notable events documented.

## 2025-01-22 NOTE — Op Note (Signed)
 Procedure: Electrical Cardioversion Indications:  Atrial Fibrillation  Procedure Details:  Consent: Risks of procedure as well as the alternatives and risks of each were explained to the (patient/caregiver).  Consent for procedure obtained.  Time Out: Verified patient identification, verified procedure, site/side was marked, verified correct patient position, special equipment/implants available, medications/allergies/relevent history reviewed, required imaging and test results available.  Performed  Patient placed on cardiac monitor, pulse oximetry, supplemental oxygen as necessary.  Sedation given: propofol  IV, Dr. Mallory. Pacer pads placed anterior and posterior chest.  Cardioverted 1 time(s).  Cardioversion with synchronized biphasic 200J shock.  Evaluation: Findings: Post procedure EKG shows: NSR Complications: None Patient did tolerate procedure well.  Time Spent Directly with the Patient:  30 minutes   Mario [name] 01/22/2025, 10:00 AM

## 2025-01-22 NOTE — Anesthesia Preprocedure Evaluation (Addendum)
"                                    Anesthesia Evaluation  Patient identified by MRN, date of birth, ID band Patient awake    Reviewed: Allergy  & Precautions, NPO status , Patient's Chart, lab work & pertinent test results, reviewed documented beta blocker date and time   Airway Mallampati: II  TM Distance: >3 FB Neck ROM: Full    Dental  (+) Teeth Intact, Dental Advisory Given   Pulmonary asthma , sleep apnea , COPD,  COPD inhaler, former smoker   Pulmonary exam normal breath sounds clear to auscultation       Cardiovascular hypertension, Pt. on home beta blockers + CAD  + dysrhythmias Atrial Fibrillation  Rhythm:Irregular Rate:Abnormal   ECHO: IMPRESSIONS   1. Left ventricular ejection fraction, by estimation, is 50 to 55%. The  left ventricle has low normal function. Left ventricular endocardial  border not optimally defined to evaluate regional wall motion. There is  mild concentric left ventricular  hypertrophy. Left ventricular diastolic function could not be evaluated.   2. Right ventricular systolic function is low normal. The right  ventricular size is normal.   3. Left atrial size was moderately dilated.   4. The mitral valve is normal in structure. Trivial mitral valve  regurgitation. No evidence of mitral stenosis.   5. The aortic valve is grossly normal. There is mild calcification of the  aortic valve. There is mild thickening of the aortic valve. Aortic valve  regurgitation is not visualized. Aortic valve sclerosis/calcification is  present, without any evidence of  aortic stenosis.    Neuro/Psych negative neurological ROS     GI/Hepatic Neg liver ROS,GERD  Medicated,,  Endo/Other  Obesity   Renal/GU      Musculoskeletal  abscess left little finger   Abdominal  (+) + obese  Peds  Hematology  (+) Blood dyscrasia (Eliquis )   Anesthesia Other Findings Day of surgery medications reviewed with the patient.   Reproductive/Obstetrics                              Anesthesia Physical Anesthesia Plan  ASA: 4 and emergent  Anesthesia Plan: MAC   Post-op Pain Management: Minimal or no pain anticipated   Induction: Intravenous  PONV Risk Score and Plan: 2 and Propofol  infusion and Treatment may vary due to age or medical condition  Airway Management Planned: Natural Airway, Nasal Cannula and Mask  Additional Equipment: None  Intra-op Plan:   Post-operative Plan:   Informed Consent: I have reviewed the patients History and Physical, chart, labs and discussed the procedure including the risks, benefits and alternatives for the proposed anesthesia with the patient or authorized representative who has indicated his/her understanding and acceptance.     Dental advisory given  Plan Discussed with: CRNA and Anesthesiologist  Anesthesia Plan Comments:         Anesthesia Quick Evaluation  "

## 2025-01-22 NOTE — Plan of Care (Signed)
" °  Problem: Metabolic: Goal: Ability to maintain appropriate glucose levels will improve Outcome: Progressing   Problem: Clinical Measurements: Goal: Ability to maintain clinical measurements within normal limits will improve Outcome: Progressing   Problem: Coping: Goal: Level of anxiety will decrease Outcome: Progressing   Problem: Pain Managment: Goal: General experience of comfort will improve and/or be controlled Outcome: Progressing   Problem: Safety: Goal: Ability to remain free from injury will improve Outcome: Progressing   "

## 2025-01-22 NOTE — Progress Notes (Signed)
 Orthopedic Tech Progress Note Patient Details:  Mario [name] [birth year] [id number]  Ortho Devices Type of Ortho Device: Radio broadcast assistant Ortho Device/Splint Location: BLE Ortho Device/Splint Interventions: Ordered, Application, Adjustment   Post Interventions Patient Tolerated: Well Instructions Provided: Care of device, Adjustment of device  [name] 01/22/2025, 5:15 PM

## 2025-01-22 NOTE — Significant Event (Addendum)
 Informed Consent   Shared Decision Making/Informed Consent   The risks [stroke, cardiac arrhythmias rarely resulting in the need for a temporary or permanent pacemaker, skin irritation or burns, esophageal damage, perforation (1:10,000 risk), bleeding, pharyngeal hematoma as well as other potential complications associated with conscious sedation including aspiration, arrhythmia, respiratory failure and death], benefits (treatment guidance, restoration of normal sinus rhythm, diagnostic support) and alternatives of a transesophageal echocardiogram guided cardioversion were discussed in detail with Mario [name] and he is willing to proceed.      Informed Consent   Shared Decision Making/Informed Consent The risks, including but not limited to, [bleeding or vascular complications (1 in 500), pneumothorax (1 in 1600), arrhythmia (1 in 1000) and death (1 in 5000)], benefits (diagnostic support and/or management of heart failure, pulmonary hypertension) and alternatives of a right heart catheterization were discussed in detail with Mario [name] and he is willing to proceed.       [name] [name], MD [place]  [facility]  01/22/2025 8:26 AM     I also called the patient's wife Mario [name] and two daughters Mario [name] and Mario [name] to discuss the procedures. Only Leak Recendez answered. I explained the risks and benefits of the procedures to Mario [name]. She provided verbal consent to proceed over the phone. Delon Decent served as a second witness to the consent.    Yaasir Menken T. Floretta [name], MD [place]  [facility]  01/22/2025 9:06 AM

## 2025-01-22 NOTE — Telephone Encounter (Signed)
 Message received from [employer] for demographic information. Printed and faxed to [phone] [place]. Mjp,lpn

## 2025-01-22 NOTE — Progress Notes (Addendum)
 " PROGRESS NOTE    Mario [name]  FMW:[id number] DOB: December 06, 1946 DOA: 01/19/2025 PCP: [name] DASEN, MD   Chief Complaint  Patient presents with   Shortness of Breath    A-fib RVR    Brief Narrative:   Mario [name] is a 79 year old male with past medical history of primary hypertension, hyperlipidemia, severe persistent asthma, COPD with bronchiectasis, obstructive sleep apnea, paroxysmal atrial fibrillation with RVR, coronary artery calcification, chronic sinusitis, fatty liver, hiatal hernia, bilateral sensorineural hearing loss, and chronic congestive heart failure. He presented to the ED on 2/1 with shortness of breath, productive cough, nausea/vomiting/diarrhea, subjective fever, poor oral intake, and palpitations x 5 days. His vitals were concerning en route to hospital (SBP 90, SpO2 71% on room air, HR 190 in atrial fibrillation RVR).    Assessment & Plan: COPD/Bronchiectasis  Acute Respiratory Failure with Hypoxemia  Secondary to pulmonary edema. SpO2 100% 2L [place]. RR 12-29.  Chest x-ray shows pulmonary edema with small bilateral pleural effusions and bibasilar atelectasis.  Viral panel negative. Blood culture negative. WBC 20.8 (2/3).  Continue albuterol , budesonide  nebulizer, prednisone .    Sepsis C. Diff Viral panel negative. C. Diff positive.  Switch Dificid  to oral vancomycin . Continue antiemetics.  Monitor.  Atrial Fibrillation with RVR Elevated Troponin due to Atrial Fibrillation, no ACS Chronic.  Potassium and magnesium normal.  Latest troponin level 78 on 2/1. Last ProBNP 9,529 on 2/1. 01/22/25 >> TEE plus DCCV  Currently stable at sinus rhythm with PACs.  Continue apixaban , amiodarone , metoprolol . Continue remote telemetry.  Monitor.   Acute Kidney Injury Anion Gap Metabolic Acidosis Suspected to be secondary to poor oral intake.  pH 7.225. CO2 21. Bicarbonate 19.4.  Not at baseline - GFR 14. Creatinine 4.13. Proteinuria > 300.  Urine culture  negative. Leukocytes and bacteria on UA.  Worsening.  Monitor. Add IVF (suspected hypoperfusion).   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  12/07/24 >> Echocardiogram showed EF to be 60 to 65% with indeterminate diastolic parameters.  01/20/25 >> Echocardiogram shows EF to be 35-40% and mildly reduced RV function.   Continue metoprolol . Discontinue Lasix  (low filling pressures on cardiac cath).     Hypoglycemia  Initial glucose reportedly 52. Today glucose 125. Stable.  Continue hypoglycemic protocol. Continue IV Dextrose  PRN if CBG <70.   Hyperlipidemia Continue statin therapy.   Candida Intertrigo Continue nystatin  powder.   Hypertension SBP range 72-126. Stable.  Hold hydralazine .   DVT prophylaxis: apixaban  tablet Code Status: Full  Family Communication: none at bedside.  Disposition:   Status is: Inpatient Remains inpatient appropriate because: severity of illness    Consultants:  Cardiology  Procedures: TEE @10 :00, Right Heart Catheterization @11 :40  Antimicrobials: vancomycin     Subjective:  Patient is lying comfortably in bed. Reports no headache, dizziness, SOB, or chest pain. A&O x person and place. Disoriented to month and year.    Objective: Vitals:   01/22/25 0804 01/22/25 0815 01/22/25 0914 01/22/25 1025  BP: 114/86  (!) 126/109 (!) 86/51  Pulse: (!) 138  (!) 140 69  Resp: 20   12  Temp: 98 F (36.7 C)     TempSrc: Oral     SpO2: 100% 100%  99%  Weight:      Height:        Intake/Output Summary (Last 24 hours) at 01/22/2025 1039 Last data filed at 01/22/2025 1022 Gross per 24 hour  Intake 568.77 ml  Output 1250 ml  Net -681.23 ml  Filed Weights   01/20/25 0329 01/21/25 0433 01/22/25 0500  Weight: 90.7 kg 90.4 kg 88.8 kg    Examination:  General exam: Appears calm and comfortable  Respiratory system: Bilateral wheezing on auscultation. Respiratory effort midly elevated. Cardiovascular system: S1  & S2 heard, RRR. No JVD, murmurs, rubs, gallops or clicks. Bilateral lower extremity pitting edema +3. Bilateral cold lower extremities.  Gastrointestinal system: Abdomen is nondistended, soft and nontender. No organomegaly or masses felt. Normal bowel sounds heard. Central nervous system: Alert & oriented x 2. No focal neurological deficits. Extremities: Symmetric 5 x 5 power. Skin: No rashes, lesions or ulcers Psychiatry: Judgement and insight appear abnormal. Mood & affect appropriate.     Data Reviewed: I have personally reviewed following labs and imaging studies.  CBC: Recent Labs  Lab 01/19/25 0910 01/19/25 0920 01/19/25 1131 01/20/25 0239 01/21/25 0535  WBC  --  25.9*  --  17.2* 20.8*  NEUTROABS  --  23.6*  --   --   --   HGB 15.6 13.0 13.3 12.8* 12.5*  HCT 46.0 41.4 39.0 40.4 38.5*  MCV  --  97.0  --  95.3 92.1  PLT  --  275  --  308 297    Basic Metabolic Panel: Recent Labs  Lab 01/19/25 0910 01/19/25 1010 01/19/25 1131 01/19/25 1924 01/20/25 0239 01/21/25 0535 01/22/25 0541  NA 134* 136 135  --  137 133* 134*  K 4.3 3.8 3.5  --  4.4 4.3 4.1  CL 107 103  --   --  102 100 100  CO2  --  13*  --   --  16* 15* 16*  GLUCOSE 53* 52*  --   --  151* 153* 130*  BUN 59* 44*  --   --  50* 60* 71*  CREATININE 2.70* 2.52*  --   --  3.15* 3.88* 4.13*  CALCIUM   --  8.3*  --   --  7.7* 7.1* 7.1*  MG  --   --   --  2.1  --  1.9  --     GFR: Estimated Creatinine Clearance: 16 mL/min (A) (by C-G formula based on SCr of 4.13 mg/dL (H)).  Liver Function Tests: Recent Labs  Lab 01/19/25 1010  AST 32  ALT 15  ALKPHOS 89  BILITOT 0.7  PROT 5.8*  ALBUMIN  2.5*    CBG: Recent Labs  Lab 01/21/25 0741 01/21/25 1135 01/21/25 1622 01/21/25 2129 01/22/25 0804  GLUCAP 132* 132* 143* 144* 120*     Recent Results (from the past 240 hours)  Resp panel by RT-PCR (RSV, Flu A&B, Covid) Anterior Nasal Swab     Status: None   Collection Time: 01/19/25  8:48 AM    Specimen: Anterior Nasal Swab  Result Value Ref Range Status   SARS Coronavirus 2 by RT PCR NEGATIVE NEGATIVE Final   Influenza A by PCR NEGATIVE NEGATIVE Final   Influenza B by PCR NEGATIVE NEGATIVE Final    Comment: (NOTE) The Xpert Xpress SARS-CoV-2/FLU/RSV plus assay is intended as an aid in the diagnosis of influenza from Nasopharyngeal swab specimens and should not be used as a sole basis for treatment. Nasal washings and aspirates are unacceptable for Xpert Xpress SARS-CoV-2/FLU/RSV testing.  Fact Sheet for Patients: bloggercourse.com  Fact Sheet for Healthcare Providers: seriousbroker.it  This test is not yet approved or cleared by the [place]  FDA and has been authorized for detection and/or diagnosis of SARS-CoV-2 by FDA under an Emergency Use Authorization (  EUA). This EUA will remain in effect (meaning this test can be used) for the duration of the COVID-19 declaration under Section 564(b)(1) of the Act, 21 U.S.C. section 360bbb-3(b)(1), unless the authorization is terminated or revoked.     Resp Syncytial Virus by PCR NEGATIVE NEGATIVE Final    Comment: (NOTE) Fact Sheet for Patients: bloggercourse.com  Fact Sheet for Healthcare Providers: seriousbroker.it  This test is not yet approved or cleared by the [place]  FDA and has been authorized for detection and/or diagnosis of SARS-CoV-2 by FDA under an Emergency Use Authorization (EUA). This EUA will remain in effect (meaning this test can be used) for the duration of the COVID-19 declaration under Section 564(b)(1) of the Act, 21 U.S.C. section 360bbb-3(b)(1), unless the authorization is terminated or revoked.  Performed at [facility], [address] [place], [place] [address]   Blood Culture (routine x 2)     Status: None (Preliminary result)   Collection Time: 01/19/25  8:50 AM   Specimen:  BLOOD  Result Value Ref Range Status   Specimen Description BLOOD RIGHT ANTECUBITAL  Final   Special Requests   Final    BOTTLES DRAWN AEROBIC AND ANAEROBIC Blood Culture results may not be optimal due to an inadequate volume of blood received in culture bottles   Culture   Final    NO GROWTH 3 DAYS Performed at [facility], [address], [place], [place] [address]    Report Status PENDING  Incomplete  Blood Culture (routine x 2)     Status: None (Preliminary result)   Collection Time: 01/19/25  8:55 AM   Specimen: BLOOD RIGHT HAND  Result Value Ref Range Status   Specimen Description BLOOD RIGHT HAND  Final   Special Requests   Final    BOTTLES DRAWN AEROBIC AND ANAEROBIC Blood Culture results may not be optimal due to an inadequate volume of blood received in culture bottles   Culture   Final    NO GROWTH 3 DAYS Performed at [facility], [address], [place], [place] [address]    Report Status PENDING  Incomplete  C Difficile Quick Screen w PCR reflex     Status: Abnormal   Collection Time: 01/19/25  1:49 PM   Specimen: Stool  Result Value Ref Range Status   C Diff antigen POSITIVE (A) NEGATIVE Final   C Diff toxin POSITIVE (A) NEGATIVE Final    Comment: RESULT CALLED TO, READ BACK BY AND VERIFIED WITH: RN: JALESA M @ 1243 ON 01/21/2025 BY DZT     C Diff interpretation Toxin producing C. difficile detected.  Final    Comment: Performed at [facility], [address] [place], [place] [address]  Urine Culture     Status: None   Collection Time: 01/19/25  6:45 PM   Specimen: Urine, Random  Result Value Ref Range Status   Specimen Description URINE, RANDOM  Final   Special Requests NONE Reflexed from [id number]  Final   Culture   Final    NO GROWTH Performed at [facility], [address], [place], [place] [address]    Report Status 01/21/2025 FINAL  Final      Radiology Studies: EP STUDY Result Date: 01/22/2025 See surgical note for  result.  ECHOCARDIOGRAM LIMITED Result Date: 01/20/2025    ECHOCARDIOGRAM LIMITED REPORT   Patient Name:   Mario [name] Farn[place] Date of Exam: 01/20/2025 Medical Rec #:  [id number]  Height:       69.0 in Accession #:    [id number]      Weight:       200.0 lb Date of Birth:  06-05-1946       BSA:          2.066 m Patient Age:    78 years        BP:           101/74 mmHg Patient Gender: M               HR:           98 bpm. Exam Location:  Inpatient Procedure: Limited Echo, Cardiac Doppler and Limited Color Doppler (Both            Spectral and Color Flow Doppler were utilized during procedure). Indications:    Atrial Fibrillation I48.9  History:        Patient has prior history of Echocardiogram examinations, most                 recent 12/11/2024. Arrythmias:Atrial Fibrillation; Risk                 Factors:Hypertension.  Sonographer:    Sydnee Wilson RDCS Referring Phys: [id number] LONNIE SULLIVAN IMPRESSIONS  1. Left ventricular ejection fraction, by estimation, is 35 to 40%. The left ventricle has moderately decreased function. The left ventricle demonstrates global hypokinesis.  2. Right ventricular systolic function is moderately reduced. There is mildly elevated pulmonary artery systolic pressure.  3. The mitral valve is normal in structure. Mild mitral valve regurgitation.  4. The inferior vena cava is normal in size with <50% respiratory variability, suggesting right atrial pressure of 8 mmHg. FINDINGS  Left Ventricle: Left ventricular ejection fraction, by estimation, is 35 to 40%. The left ventricle has moderately decreased function. The left ventricle demonstrates global hypokinesis. Right Ventricle: Right ventricular systolic function is moderately reduced. There is mildly elevated pulmonary artery systolic pressure. The tricuspid regurgitant velocity is 2.85 m/s, and with an assumed right atrial pressure of 8 mmHg, the estimated right ventricular systolic pressure is 40.5 mmHg. Mitral Valve: The mitral valve  is normal in structure. Mild mitral valve regurgitation. Tricuspid Valve: Tricuspid valve regurgitation is mild. Venous: The inferior vena cava is normal in size with less than 50% respiratory variability, suggesting right atrial pressure of 8 mmHg. LEFT VENTRICLE PLAX 2D LVIDd:         3.20 cm LVIDs:         2.50 cm LV PW:         1.00 cm LV IVS:        0.90 cm LVOT diam:     2.00 cm LVOT Area:     3.14 cm  LV Volumes (MOD) LV vol d, MOD A2C: 65.7 ml LV vol d, MOD A4C: 53.2 ml LV vol s, MOD A2C: 32.2 ml LV vol s, MOD A4C: 28.4 ml LV SV MOD A2C:     33.5 ml LV SV MOD A4C:     53.2 ml LV SV MOD BP:      30.2 ml LEFT ATRIUM           Index LA Vol (A2C): 24.8 ml 12.00 ml/m LA Vol (A4C): 50.1 ml 24.25 ml/m  MITRAL VALVE               TRICUSPID VALVE MV Area (PHT): 5.60 cm    TR Peak grad:   32.5 mmHg MV Decel Time:  136 msec    TR Vmax:        285.00 cm/s MV E velocity: 74.15 cm/s MV A velocity: 32.50 cm/s  SHUNTS MV E/A ratio:  2.28        Systemic Diam: 2.00 cm Morene Brownie Electronically signed by Morene Brownie Signature Date/Time: 01/20/2025/1:42:22 PM    Final      Scheduled Meds:  [MAR Hold] apixaban   5 mg Oral BID   [MAR Hold] atorvastatin   40 mg Oral Daily   [MAR Hold] budesonide  (PULMICORT ) nebulizer solution  0.5 mg Nebulization BID   [MAR Hold] Chlorhexidine  Gluconate Cloth  6 each Topical Daily   [MAR Hold] doxycycline   100 mg Oral Q12H   [MAR Hold] fidaxomicin   200 mg Oral BID   [MAR Hold] metoprolol  tartrate  25 mg Oral Q6H   [MAR Hold] montelukast   10 mg Oral Daily   [MAR Hold] nystatin    Topical BID   [MAR Hold] pantoprazole   40 mg Oral BID   [MAR Hold] predniSONE   40 mg Oral Q breakfast   [MAR Hold] sodium chloride  flush  3 mL Intravenous Q12H   [MAR Hold] tamsulosin   0.4 mg Oral QPC supper   Continuous Infusions:  amiodarone  30 mg/hr (01/22/25 0641)   [MAR Hold] furosemide  Stopped (01/21/25 1946)     LOS: 3 days    Time spent:     Mario [name], Student PA [facility]   To contact the attending provider between 7A-7P or the covering provider during after hours 7P-7A, please log into the web site [email or web address] and access using universal [place] password for that web site. If you do not have the password, please call the hospital operator.  01/22/2025, 10:39 AM   "

## 2025-01-22 NOTE — Plan of Care (Signed)
°  Problem: Clinical Measurements: °Goal: Respiratory complications will improve °Outcome: Progressing °Goal: Cardiovascular complication will be avoided °Outcome: Progressing °  °Problem: Elimination: °Goal: Will not experience complications related to urinary retention °Outcome: Progressing °  °Problem: Safety: °Goal: Ability to remain free from injury will improve °Outcome: Progressing °  °

## 2025-01-22 NOTE — Progress Notes (Signed)
 "  Rounding Note   Patient Name: Mario [name] Date of Encounter: 01/22/2025  [facility] Cardiologist: Dorn Lesches, MD   Subjective - No acute events overnight - Patient remains short of breath   Scheduled Meds:  apixaban   5 mg Oral BID   atorvastatin   40 mg Oral Daily   budesonide  (PULMICORT ) nebulizer solution  0.5 mg Nebulization BID   Chlorhexidine  Gluconate Cloth  6 each Topical Daily   doxycycline   100 mg Oral Q12H   fidaxomicin   200 mg Oral BID   metoprolol  tartrate  25 mg Oral Q6H   montelukast   10 mg Oral Daily   nystatin    Topical BID   pantoprazole   40 mg Oral BID   predniSONE   40 mg Oral Q breakfast   sodium chloride  flush  3 mL Intravenous Q12H   tamsulosin   0.4 mg Oral QPC supper   Continuous Infusions:  amiodarone  30 mg/hr (01/22/25 0641)   furosemide  Stopped (01/21/25 1946)   PRN Meds: acetaminophen  **OR** acetaminophen , albuterol , dextrose , haloperidol  lactate   Vital Signs  Vitals:   01/22/25 0026 01/22/25 0451 01/22/25 0452 01/22/25 0500  BP: 107/72 112/86 112/86   Pulse:  (!) 139 (!) 138   Resp: 20 (!) 21    Temp: 97.6 F (36.4 C) 97.7 F (36.5 C)    TempSrc: Axillary Oral    SpO2:  100%    Weight:    88.8 kg  Height:        Intake/Output Summary (Last 24 hours) at 01/22/2025 0742 Last data filed at 01/22/2025 9358 Gross per 24 hour  Intake 922.77 ml  Output 1250 ml  Net -327.23 ml      01/22/2025    5:00 AM 01/21/2025    4:33 AM 01/20/2025    3:29 AM  Last 3 Weights  Weight (lbs) 195 lb 12.3 oz 199 lb 4.8 oz 200 lb  Weight (kg) 88.8 kg 90.402 kg 90.719 kg      Telemetry Atrial fibrillation with RVR- Personally Reviewed  ECG  Atrial fibrillation with RVR- Personally Reviewed  Physical Exam  GEN: Chronically ill-appearing gentleman in NAD Neck: No clear JVD today but challenging to assess Cardiac: Tachycardic with irregularly irregular rhythm, no murmurs, rubs, or gallops.  Respiratory: Clear to auscultation  bilaterally. GI: Soft, nontender, non-distended  MS: 2+ pitting edema bilaterally to shins; No deformity. Neuro:  Nonfocal  Psych: Normal affect   Labs High Sensitivity Troponin:  No results for input(s): TROPONINIHS in the last 720 hours.  Recent Labs  Lab 01/19/25 1010 01/19/25 1315  TRNPT 99* 78*       Chemistry Recent Labs  Lab 01/19/25 1010 01/19/25 1131 01/19/25 1924 01/20/25 0239 01/21/25 0535 01/22/25 0541  NA 136   < >  --  137 133* 134*  K 3.8   < >  --  4.4 4.3 4.1  CL 103  --   --  102 100 100  CO2 13*  --   --  16* 15* 16*  GLUCOSE 52*  --   --  151* 153* 130*  BUN 44*  --   --  50* 60* 71*  CREATININE 2.52*  --   --  3.15* 3.88* 4.13*  CALCIUM  8.3*  --   --  7.7* 7.1* 7.1*  MG  --   --  2.1  --  1.9  --   PROT 5.8*  --   --   --   --   --  ALBUMIN  2.5*  --   --   --   --   --   AST 32  --   --   --   --   --   ALT 15  --   --   --   --   --   ALKPHOS 89  --   --   --   --   --   BILITOT 0.7  --   --   --   --   --   GFRNONAA 25*  --   --  19* 15* 14*  ANIONGAP 20*  --   --  18* 18* 19*   < > = values in this interval not displayed.    Lipids No results for input(s): CHOL, TRIG, HDL, LABVLDL, LDLCALC, CHOLHDL in the last 168 hours.  Hematology Recent Labs  Lab 01/19/25 0920 01/19/25 1131 01/20/25 0239 01/21/25 0535  WBC 25.9*  --  17.2* 20.8*  RBC 4.27  --  4.24 4.18*  HGB 13.0 13.3 12.8* 12.5*  HCT 41.4 39.0 40.4 38.5*  MCV 97.0  --  95.3 92.1  MCH 30.4  --  30.2 29.9  MCHC 31.4  --  31.7 32.5  RDW 17.2*  --  17.3* 17.2*  PLT 275  --  308 297   Thyroid  No results for input(s): TSH, FREET4 in the last 168 hours.  BNP Recent Labs  Lab 01/19/25 1010  PROBNP 9,529.0*    DDimer No results for input(s): DDIMER in the last 168 hours.   Radiology  ECHOCARDIOGRAM LIMITED Result Date: 01/20/2025    ECHOCARDIOGRAM LIMITED REPORT   Patient Name:   Mario [name] Date of Exam: 01/20/2025 Medical Rec #:  [id number]       Height:        69.0 in Accession #:    [id number]      Weight:       200.0 lb Date of Birth:  [birth year]       BSA:          2.066 m Patient Age:    78 years        BP:           101/74 mmHg Patient Gender: M               HR:           98 bpm. Exam Location:  Inpatient Procedure: Limited Echo, Cardiac Doppler and Limited Color Doppler (Both            Spectral and Color Flow Doppler were utilized during procedure). Indications:    Atrial Fibrillation I48.9  History:        Patient has prior history of Echocardiogram examinations, most                 recent 12/11/2024. Arrythmias:Atrial Fibrillation; Risk                 Factors:Hypertension.  Sonographer:    Sydnee Wilson RDCS Referring Phys: [id number] Bach Rocchi IMPRESSIONS  1. Left ventricular ejection fraction, by estimation, is 35 to 40%. The left ventricle has moderately decreased function. The left ventricle demonstrates global hypokinesis.  2. Right ventricular systolic function is moderately reduced. There is mildly elevated pulmonary artery systolic pressure.  3. The mitral valve is normal in structure. Mild mitral valve regurgitation.  4. The inferior vena cava is normal in size with <50% respiratory variability, suggesting right atrial pressure of 8 mmHg. FINDINGS  Left Ventricle: Left ventricular ejection fraction, by estimation, is 35 to 40%. The left ventricle has moderately decreased function. The left ventricle demonstrates global hypokinesis. Right Ventricle: Right ventricular systolic function is moderately reduced. There is mildly elevated pulmonary artery systolic pressure. The tricuspid regurgitant velocity is 2.85 m/s, and with an assumed right atrial pressure of 8 mmHg, the estimated right ventricular systolic pressure is 40.5 mmHg. Mitral Valve: The mitral valve is normal in structure. Mild mitral valve regurgitation. Tricuspid Valve: Tricuspid valve regurgitation is mild. Venous: The inferior vena cava is normal in size with less than 50%  respiratory variability, suggesting right atrial pressure of 8 mmHg. LEFT VENTRICLE PLAX 2D LVIDd:         3.20 cm LVIDs:         2.50 cm LV PW:         1.00 cm LV IVS:        0.90 cm LVOT diam:     2.00 cm LVOT Area:     3.14 cm  LV Volumes (MOD) LV vol d, MOD A2C: 65.7 ml LV vol d, MOD A4C: 53.2 ml LV vol s, MOD A2C: 32.2 ml LV vol s, MOD A4C: 28.4 ml LV SV MOD A2C:     33.5 ml LV SV MOD A4C:     53.2 ml LV SV MOD BP:      30.2 ml LEFT ATRIUM           Index LA Vol (A2C): 24.8 ml 12.00 ml/m LA Vol (A4C): 50.1 ml 24.25 ml/m  MITRAL VALVE               TRICUSPID VALVE MV Area (PHT): 5.60 cm    TR Peak grad:   32.5 mmHg MV Decel Time: 136 msec    TR Vmax:        285.00 cm/s MV E velocity: 74.15 cm/s MV A velocity: 32.50 cm/s  SHUNTS MV E/A ratio:  2.28        Systemic Diam: 2.00 cm Morene Brownie Electronically signed by Morene Brownie Signature Date/Time: 01/20/2025/1:42:22 PM    Final     Cardiac Studies  TTE - pending  TTE 12/11/24:  IMPRESSIONS     1. Left ventricular ejection fraction, by estimation, is 60 to 65%. The  left ventricle has normal function. The left ventricle has no regional  wall motion abnormalities. There is mild concentric left ventricular  hypertrophy. Left ventricular diastolic  parameters are indeterminate.   2. Right ventricular systolic function is normal. The right ventricular  size is normal.   3. Left atrial size was moderately dilated.   4. Right atrial size was moderately dilated.   5. The mitral valve is normal in structure. Trivial mitral valve  regurgitation. Moderate mitral stenosis.   6. The aortic valve is tricuspid. There is mild calcification of the  aortic valve. Aortic valve regurgitation is not visualized. Aortic valve  sclerosis/calcification is present, without any evidence of aortic  stenosis.   7. The inferior vena cava is normal in size with greater than 50%  respiratory variability, suggesting right atrial pressure of 3 mmHg.   8.  Agitated saline contrast bubble study was negative, with no evidence  of any interatrial shunt.   Patient Profile   Mario [name] is a 79 y.o. male with a hx of persistent atrial fibrillation, HLD, HTN, OSA on CPAP, bronchiectasis, COPD, coronary calcifications on CT who is being seen 01/19/2025 for the evaluation of atrial fibrillation at  the request of Dr. Lenor.   Assessment & Plan    #Atrial Fibrillation w/ RVR s/p Ablation (09/19/24) -Patient admitted with acute hypoxic respiratory failure in the setting of SOB and diarrhea found to be in atrial fibrillation with RVR. - The patient has had very hard to control atrial fibrillation over the years and is being rate controlled as an outpatient with metoprolol  and diltiazem . - Currently on IV amiodarone  due to relative hypotension. -Despite multiple days of IV amiodarone  and metoprolol  the patient has remained in significant RVR with worsening renal and cardiac function.  I discussed TEE plus DCCV with the patient and his family and they are agreeable to proceed.  I believe restoration of NSR will be paramount for him. Made n.p.o. this morning TEE plus DCCV today -consent obtained from patient and family Continue Eliquis  5 mg twice daily Continue IV amiodarone  for now but if his ins become a concern we will have to transition to p.o. Amio Continue metoprolol  to tartrate 25 mg every 6 hours   #New Acute HFrEF #RV Dysfunction #Acute Hypoxic Respiratory Failure - The patient previously has had a preserved LVEF with his last echocardiogram in 2024 showing normal systolic function. - Limited TTE shows a moderate reduction in LVEF to 35 to 40%.  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 - Ultimately will need to initiate GDMT; however, his blood pressure is a bit tenuous so we will have to be cautious. Continue metoprolol  as  described above Patient appears volume up to me, but his renal function continues to worsen despite diuresis.  Will pursue an RHC today -patient and family consented Hold diuresis until RHC results GDMT limited by renal insufficiency Strict I's and O's -Foley placed Daily weights  #AKI - Patient with a baseline creatinine of 1 now with severe AKI. - I am unsure if this is cardiorenal vs intrinsic kidney injury or something else. - We will pursue getting an RHC to better understand his filling pressures. Urine studies RHC as above Avoiding nephrotoxic medications May need nephrology involvement  #Sepsis Physiology #Possible PNA #Diarrhea - Patient has sepsis physiology with leukocytosis, respiratory failure and GI symptoms. - Currently being evaluated for infection and C. difficile. Management per primary    For questions or updates, please contact [facility] Please consult [email or web address] for contact info under       Signed, Georganna Archer, MD  01/22/2025, 7:42 AM    "

## 2025-01-22 NOTE — TOC Progression Note (Signed)
 Transition of Care [facility]) - Progression Note    Patient Details  Name: Mario [name] MRN: [id number] Date of Birth: 03/01/1946  Transition of Care [facility]) CM/SW Contact  Isaiah Public, LCSWA Phone Number: 01/22/2025, 4:49 PM  Clinical Narrative:     Due to patients current orientation, CSW spoke with patients spouse Reena and family at bedside. CSW provided patients spouse with medicare compare list of patients SNF bed offers. Patients spouse request for CSW [place] for possible SNF bed offers. CSW to follow up with patients spouse tomorrow on SNF choice.  Expected Discharge Plan: Skilled Nursing Facility Barriers to Discharge: Continued Medical Work up               Expected Discharge Plan and Services In-house Referral: Clinical Social Work Discharge Planning Services: CM Consult Post Acute Care Choice: Skilled Nursing Facility Living arrangements for the past 2 months: Single Family Home                                       Social Drivers of Health (SDOH) Interventions SDOH Screenings   Food Insecurity: No Food Insecurity (01/20/2025)  Recent Concern: Food Insecurity - Food Insecurity Present (12/07/2024)  Housing: Low Risk (01/20/2025)  Recent Concern: Housing - High Risk (12/07/2024)  Transportation Needs: No Transportation Needs (01/20/2025)  Utilities: Not At Risk (01/20/2025)  Recent Concern: Utilities - At Risk (12/07/2024)  Alcohol Screen: Low Risk (05/08/2024)  Depression (PHQ2-9): Low Risk (12/13/2024)  Financial Resource Strain: Medium Risk (10/19/2024)  Physical Activity: Inactive (10/19/2024)  Social Connections: Socially Integrated (01/20/2025)  Stress: No Stress Concern Present (10/19/2024)  Tobacco Use: Medium Risk (01/19/2025)  Health Literacy: Adequate Health Literacy (05/08/2024)    Readmission Risk Interventions    12/11/2024    4:13 PM 02/18/2024   10:38 AM  Readmission Risk Prevention  Plan  Post Dischage Appt Complete Complete  Medication Screening Complete Complete  Transportation Screening Complete Complete

## 2025-01-22 NOTE — Op Note (Signed)
 INDICATIONS: Atrial fibrillation  PROCEDURE:   Informed consent was obtained prior to the procedure. The risks, benefits and alternatives for the procedure were discussed and the patient comprehended these risks.  Risks include, but are not limited to, cough, sore throat, vomiting, nausea, somnolence, esophageal and stomach trauma or perforation, bleeding, low blood pressure, aspiration, pneumonia, infection, trauma to the teeth and death.    After a procedural time-out, the oropharynx was anesthetized with 20% benzocaine spray.   During this procedure the patient was administered IV propofol  by Anesthesiology, Dr. Mallory. Abbreviated study due to tenuous baseline respiratory status.  The transesophageal probe was inserted in the esophagus and stomach without difficulty and multiple views were obtained.  The patient was kept under observation until the patient left the procedure room.  The patient left the procedure room in stable condition.   Agitated microbubble saline contrast was not administered.  COMPLICATIONS:    There were no immediate complications.  FINDINGS:  Dilated left atrium with spontaneous echo contrast. No thrombus was seen in the left atrial appendage. Severe biventricular dysfunction. LVEF 25-30%, global hypokinesis. No significant valvular abnormalities. No important valvular abnormalities.  RECOMMENDATIONS:     Proceed with cardioversion.  Time Spent Directly with the Patient:  30 minutes   Mario [name] 01/22/2025, 9:57 AM

## 2025-01-22 NOTE — Interval H&P Note (Signed)
 History and Physical Interval Note:  01/22/2025 9:17 AM  Mario [name]  has presented today for surgery, with the diagnosis of afib.  The various methods of treatment have been discussed with the patient and family. After consideration of risks, benefits and other options for treatment, the patient has consented to  Procedures: TRANSESOPHAGEAL ECHOCARDIOGRAM (N/A) CARDIOVERSION (N/A) as a surgical intervention.  The patient's history has been reviewed, patient examined, no change in status, stable for surgery.  I have reviewed the patient's chart and labs.  Questions were answered to the patient's satisfaction.     Kiasia Chou

## 2025-01-23 ENCOUNTER — Encounter (HOSPITAL_COMMUNITY): Payer: Self-pay | Admitting: Cardiology

## 2025-01-23 ENCOUNTER — Inpatient Hospital Stay: Admitting: Internal Medicine

## 2025-01-23 ENCOUNTER — Encounter (HOSPITAL_COMMUNITY)

## 2025-01-23 DIAGNOSIS — I5023 Acute on chronic systolic (congestive) heart failure: Secondary | ICD-10-CM | POA: Diagnosis not present

## 2025-01-23 DIAGNOSIS — N179 Acute kidney failure, unspecified: Secondary | ICD-10-CM | POA: Diagnosis not present

## 2025-01-23 DIAGNOSIS — A0472 Enterocolitis due to Clostridium difficile, not specified as recurrent: Secondary | ICD-10-CM

## 2025-01-23 DIAGNOSIS — I4891 Unspecified atrial fibrillation: Secondary | ICD-10-CM | POA: Diagnosis not present

## 2025-01-23 LAB — GLUCOSE, CAPILLARY
Glucose-Capillary: 93 mg/dL (ref 70–99)
Glucose-Capillary: 105 mg/dL — ABNORMAL HIGH (ref 70–99)
Glucose-Capillary: 71 mg/dL (ref 70–99)
Glucose-Capillary: 71 mg/dL (ref 70–99)

## 2025-01-23 LAB — RENAL FUNCTION PANEL
Albumin: 2.7 g/dL — ABNORMAL LOW (ref 3.5–5.0)
Anion gap: 16 — ABNORMAL HIGH (ref 5–15)
BUN: 71 mg/dL — ABNORMAL HIGH (ref 8–23)
CO2: 19 mmol/L — ABNORMAL LOW (ref 22–32)
Calcium: 6.7 mg/dL — ABNORMAL LOW (ref 8.9–10.3)
Chloride: 102 mmol/L (ref 98–111)
Creatinine, Ser: 4.04 mg/dL — ABNORMAL HIGH (ref 0.61–1.24)
GFR, Estimated: 14 mL/min — ABNORMAL LOW
Glucose, Bld: 105 mg/dL — ABNORMAL HIGH (ref 70–99)
Phosphorus: 6.2 mg/dL — ABNORMAL HIGH (ref 2.5–4.6)
Potassium: 3.9 mmol/L (ref 3.5–5.1)
Sodium: 137 mmol/L (ref 135–145)

## 2025-01-23 LAB — URINALYSIS, ROUTINE W REFLEX MICROSCOPIC
Bilirubin Urine: NEGATIVE
Glucose, UA: NEGATIVE mg/dL
Ketones, ur: NEGATIVE mg/dL
Nitrite: NEGATIVE
Protein, ur: 30 mg/dL — AB
RBC / HPF: >50 RBC/hpf (ref 0–5)
Specific Gravity, Urine: 1.011 (ref 1.005–1.030)
pH: 5 (ref 5.0–8.0)

## 2025-01-23 LAB — SODIUM, URINE, RANDOM: Sodium, Ur: 39 mmol/L

## 2025-01-23 LAB — CREATININE, URINE, RANDOM: Creatinine, Urine: 48 mg/dL

## 2025-01-23 MED ORDER — LACTATED RINGERS IV BOLUS
250.0000 mL | Freq: Once | INTRAVENOUS | Status: AC
  Administered 2025-01-23: 250 mL via INTRAVENOUS

## 2025-01-23 MED ORDER — SODIUM CHLORIDE 0.9 % IV SOLN: INTRAVENOUS | Status: DC

## 2025-01-23 MED ORDER — AMIODARONE HCL 200 MG PO TABS: 200.0000 mg | ORAL_TABLET | Freq: Every day | ORAL | Status: DC

## 2025-01-23 MED ORDER — AMIODARONE HCL 200 MG PO TABS
200.0000 mg | ORAL_TABLET | Freq: Two times a day (BID) | ORAL | Status: DC
  Administered 2025-01-23: 200 mg via ORAL
  Filled 2025-01-23: qty 1

## 2025-01-23 MED ORDER — AMIODARONE HCL 200 MG PO TABS
200.0000 mg | ORAL_TABLET | Freq: Two times a day (BID) | ORAL | Status: DC
  Administered 2025-01-23 – 2025-01-24 (×2): 200 mg via ORAL
  Filled 2025-01-23 (×2): qty 1

## 2025-01-23 NOTE — Progress Notes (Signed)
 Heart Failure Nurse Navigator Progress Note  PCP: [name] DASEN, MD PCP-Cardiologist: Court  Admission Diagnosis: Acute on Chronic respiratory failure with hypoxia, Sepsis, due to unspecified organism . Atrial fibrillation with RVR, AKI.  Admitted from: Home via EMS  Presentation:   Mario [name] presented with shortness of breath for 5 days and diarrhea for 3 days, was started on antibiotics. Oxygen levels per EMS on arrival were in the 70's on room air. BP 102/82, HR 162, Pro BNP 9,529, Creat 2.7, CBG 53. WBC 25.9,Was started on septic protocol with antibiotics for pneumonia.  C-Diff positive. EKG shows A-fib with RVR. CXR with pulmonary edema,   Patient was educated on the sign and symptoms of heart failure, daily weights, when to call his doctor or go to the ED. Diet/ fluid restrictions, patient reports that his wife cooks with salt. Continued education on the importance of taking all his medications as prescribed and attending all medical appointments. Patient verbalized his understanding. A HF TOC appointment was scheduled for 01/29/2025 @ 9:30 am.   ECHO/ LVEF: 25-30%   Clinical Course:  Past Medical History:  Diagnosis Date   Arrhythmia    Arthritis    Asthma    Atrial fibrillation (HCC)    Biliary colic 2014   Bronchiectasis (HCC)    2021   Bronchiectasis (HCC) 11/18/2024   Cataract    COPD (chronic obstructive pulmonary disease) with chronic bronchitis (HCC)    Coronary artery calcification seen on CAT scan    Cyst of pancreas    1 cm (7/18), repeat ct in 12 months.     Hyperlipidemia    Hypertension    Nausea vomiting and diarrhea 01/19/2025   Obstructive sleep apnea    Pneumonia 10/24/2024   left lower lobe   Renal disorder    Rhinitis    Sleep apnea    pt denies sleep apnea-lost weight     Social History   Socioeconomic History   Marital status: Married    Spouse name: Not on file   Number of children: Not on file   Years of education: Not on file    Highest education level: Bachelor's degree (e.g., BA, AB, BS)  Occupational History   Not on file  Tobacco Use   Smoking status: Former    Current packs/day: 0.00    Average packs/day: 2.0 packs/day for 11.0 years (22.0 ttl pk-yrs)    Types: Cigarettes    Start date: 12/20/1963    Quit date: 12/19/1974    Years since quitting: 50.1   Smokeless tobacco: Never  Vaping Use   Vaping status: Never Used  Substance and Sexual Activity   Alcohol use: Not Currently    Alcohol/week: 1.0 standard drink of alcohol   Drug use: No   Sexual activity: Not Currently  Other Topics Concern   Not on file  Social History Narrative   Not on file   Social Drivers of Health   Tobacco Use: Medium Risk (01/19/2025)   Patient History    Smoking Tobacco Use: Former    Smokeless Tobacco Use: Never    Passive Exposure: Not on file  Financial Resource Strain: Medium Risk (10/19/2024)   Overall Financial Resource Strain (CARDIA)    Difficulty of Paying Living Expenses: Somewhat hard  Food Insecurity: No Food Insecurity (01/20/2025)   Epic    Worried About Running Out of Food in the Last Year: Never true    Ran Out of Food in the Last Year: Never true  Recent Concern: Food Insecurity - Food Insecurity Present (12/07/2024)   Epic    Worried About Programme Researcher, Broadcasting/film/video in the Last Year: Sometimes true    Ran Out of Food in the Last Year: Sometimes true  Transportation Needs: No Transportation Needs (01/20/2025)   Epic    Lack of Transportation (Medical): No    Lack of Transportation (Non-Medical): No  Physical Activity: Inactive (10/19/2024)   Exercise Vital Sign    Days of Exercise per Week: 0 days    Minutes of Exercise per Session: Not on file  Stress: No Stress Concern Present (10/19/2024)   [employer] of Occupational Health - Occupational Stress Questionnaire    Feeling of Stress: Only a little  Social Connections: Socially Integrated (01/20/2025)   Social Connection and Isolation Panel    Frequency  of Communication with Friends and Family: More than three times a week    Frequency of Social Gatherings with Friends and Family: More than three times a week    Attends Religious Services: More than 4 times per year    Active Member of Clubs or Organizations: Yes    Attends Banker Meetings: 1 to 4 times per year    Marital Status: Married  Depression (PHQ2-9): Low Risk (12/13/2024)   Depression (PHQ2-9)    PHQ-2 Score: 0  Alcohol Screen: Low Risk (05/08/2024)   Alcohol Screen    Last Alcohol Screening Score (AUDIT): 0  Housing: Low Risk (01/20/2025)   Epic    Unable to Pay for Housing in the Last Year: No    Number of Times Moved in the Last Year: 0    Homeless in the Last Year: No  Recent Concern: Housing - High Risk (12/07/2024)   Epic    Unable to Pay for Housing in the Last Year: Yes    Number of Times Moved in the Last Year: 0    Homeless in the Last Year: No  Utilities: Not At Risk (01/20/2025)   Epic    Threatened with loss of utilities: No  Recent Concern: Utilities - At Risk (12/07/2024)   Epic    Threatened with loss of utilities: Yes  Health Literacy: Adequate Health Literacy (05/08/2024)   B1300 Health Literacy    Frequency of need for help with medical instructions: Never   Education Assessment and Provision:  Detailed education and instructions provided on heart failure disease management including the following:  Signs and symptoms of Heart Failure When to call the physician Importance of daily weights Low sodium diet Fluid restriction Medication management Anticipated future follow-up appointments  Patient education given on each of the above topics.  Patient acknowledges understanding via teach back method and acceptance of all instructions.  Education Materials:  Living Better With Heart Failure Booklet, HF zone tool, & Daily Weight Tracker Tool.  Patient has scale at home: Yes Patient has pill box at home: Yes    High Risk Criteria for  Readmission and/or Poor Patient Outcomes: Heart failure hospital admissions (last 6 months): 0  No Show rate: 1 %  Difficult social situation: No, lives with his wife, may be discharged to Rehab.  Demonstrates medication adherence: Yes Primary Language: English  Literacy level: Reading, writing, and comprehension  Barriers of Care:   Diet/ fluid restrictions Daily weights  Considerations/Referrals:   Referral made to Heart Failure Pharmacist Stewardship: yes Referral made to Heart Failure CSW/NCM TOC: NA Referral made to Heart & Vascular TOC clinic: Yes, 01/29/2025 @ 9:30 am  Items for Follow-up on DC/TOC: Continued HF education Diet/ fluid restrictions/ daily weights   Stephane Haddock, BSN, RN Heart Failure Print Production Planner Chat Only

## 2025-01-23 NOTE — Assessment & Plan Note (Signed)
 Acute hypoxemic respiratory failure, likely poor respiratory reserve No signs of acute exacerbation Continue bronchodilator therapy  Today 02 saturation is 100% on room air

## 2025-01-23 NOTE — Progress Notes (Signed)
 " Progress Note   Patient: Mario [name] FMW:[id number] DOB: 1946/03/31 DOA: 01/19/2025     4 DOS: the patient was seen and examined on 01/23/2025   Brief hospital course: Mr. Mario [name] was admitted to the hospital with the working diagnosis of sepsis, due to C diff, complicated with respiratory failure    79 yo male with the past medical history of hypertension, hyperlipidemia, atrial fibrillation, coronary artery disease and COPD with bronchiectasis who presented with nausea, vomiting and diarrhea.  Recent hospitalization 12/20 to 12/11/24 for left hand infection, he was discharged on linezoild and Augmentin , completed therapy on 01/13/25.  Reported 3 days of diarrhea, and worsening dyspnea, EMS was called and he was found in respiratory distress, hypoxic, hypotensive and tachycardic. In the ED he was placed on Bipap   On his initial physical examination blood pressure 69/59, 102/82, HR 162, RR 19 and 02 saturation 97% Lungs with no wheezing or rhonchi, heart with S1 and S2 present, irregularly irregular, abdomen, with no distention and positive lower extremity edema.   Patient initially placed on IV furosemide  for diuresis 02/04 direct current cardioversion and cardiac catheterization.  Improved filling pressures. Added IV fluids for worsening renal failure  02/05 improving serum cr   Assessment and Plan: * C. difficile colitis Sepsis due to C diff, wbc continue to be elevated at 20,8 plan to continue therapy with oral vancomycin .   Diarrhea is improving.  Follow up cell count in am.   Atrial fibrillation with RVR (HCC) Paroxysmal atrial fibrillation, now sp direct current cardioversion Telemetry sinus with frequent PAC Continue metoprolol  and anticoagulation with apixban  Continue with amiodarone    Acute on chronic systolic CHF (congestive heart failure) (HCC) Echocardiogram with reduced LV systolic function with EF 35 to 40%,  Global hypokinesis, RVSP 40,5 RV systolic function with  moderate reduction, no significant valvular disease.   Cardiac catheterization  RA 7 RV 47/9 PA 45/24 mean 30 PCWP 11 Cardiac output 5,17 and index 2.52 (FIick) PVR 3,7 WU   Normal filling pressures with precapillary pulmonary hypertension   Urine output 2,225 ml Systolic blood pressure 90 to 899   Limited medical therapy due to risk of hypotension and acutely reduced GFR   AKI (acute kidney injury) Hyponatremia   Renal function with serum cr at 4,0 with K at 3,9 and serum bicarbonate at 19  Na 137 P 6,2   Plan to continue IV fluids with isotonic saline at 100 ml per hr Follow up renal function and electrolytes in am Avoid hypotension and nephrotoxic medications   Bronchiectasis with (acute) exacerbation (HCC) Acute hypoxemic respiratory failure, likely poor respiratory reserve No signs of acute exacerbation Continue bronchodilator therapy  Today 02 saturation is 100% on room air   Candidal intertrigo Continue nystatin    Hyperlipidemia Continue statin         Subjective: Patient with no chest pain and no dyspnea, clinically improving edema   Physical Exam: Vitals:   01/23/25 0934 01/23/25 1145 01/23/25 1623 01/23/25 1712  BP:  118/71 (!) 88/53 98/75  Pulse:  69 79   Resp:  17 16 15   Temp:  97.6 F (36.4 C) 98.6 F (37 C)   TempSrc:  Oral Oral   SpO2: 95% 95% 100%   Weight:      Height:       Neurology awake and alert, deconditioned ENT with mild pallor with no icterus Cardiovascular with S1 and S2 present and regular with no gallops or rubs, positive systolic murmur  at the apex No JVD Respiratory with no rales or wheezing, no rhonchi Abdomen with no distention  Lower extremity edema trace, unna boots in place, warm to the touch  Data Reviewed:    Family Communication: no family at the bedside   Disposition: Status is: Inpatient Remains inpatient appropriate because: IV fluids   Planned Discharge Destination: Home     Author: Elidia Toribio Furnace, MD 01/23/2025 6:15 PM  For on call review [email or web address].  "

## 2025-01-23 NOTE — Assessment & Plan Note (Signed)
 Continue nystatin

## 2025-01-23 NOTE — Assessment & Plan Note (Signed)
 Continue statin  Continue statin

## 2025-01-23 NOTE — Assessment & Plan Note (Signed)
 Hyponatremia   Renal function with serum cr at 4,0 with K at 3,9 and serum bicarbonate at 19  Na 137 P 6,2   Plan to continue IV fluids with isotonic saline at 100 ml per hr Follow up renal function and electrolytes in am Avoid hypotension and nephrotoxic medications

## 2025-01-23 NOTE — Assessment & Plan Note (Signed)
 Sepsis due to C diff, wbc continue to be elevated at 20,8 plan to continue therapy with oral vancomycin .   Diarrhea is improving.  Follow up cell count in am.

## 2025-01-23 NOTE — Progress Notes (Signed)
 "  Rounding Note   Patient Name: Mario [name] Date of Encounter: 01/23/2025  [facility] Cardiologist: Dorn Lesches, MD   Subjective - No acute events overnight - The patient says that he feels better today, but still reports overall generalized weakness - C. difficile positive  Scheduled Meds:  apixaban   5 mg Oral BID   atorvastatin   40 mg Oral Daily   budesonide  (PULMICORT ) nebulizer solution  0.5 mg Nebulization BID   Chlorhexidine  Gluconate Cloth  6 each Topical Daily   metoprolol  tartrate  25 mg Oral Q6H   montelukast   10 mg Oral Daily   nystatin    Topical BID   pantoprazole   40 mg Oral BID   sodium chloride  flush  3 mL Intravenous Q12H   sodium chloride  flush  3 mL Intravenous Q12H   tamsulosin   0.4 mg Oral QPC supper   vancomycin   125 mg Oral QID   Continuous Infusions:  sodium chloride  20 mL/hr at 01/22/25 1700   sodium chloride      sodium chloride  100 mL/hr at 01/22/25 2314   amiodarone  30 mg/hr (01/23/25 0212)   PRN Meds: sodium chloride , acetaminophen  **OR** acetaminophen , acetaminophen , albuterol , dextrose , ondansetron  (ZOFRAN ) IV, sodium chloride  flush   Vital Signs  Vitals:   01/22/25 1949 01/22/25 2120 01/22/25 2316 01/23/25 0444  BP: (!) 96/57  93/75 108/75  Pulse: 69  70 76  Resp: 15  14 14   Temp: (!) 97.5 F (36.4 C)  97.9 F (36.6 C) 97.7 F (36.5 C)  TempSrc: Axillary  Axillary Axillary  SpO2: 100% 98% 100% 100%  Weight:    88.4 kg  Height:        Intake/Output Summary (Last 24 hours) at 01/23/2025 0741 Last data filed at 01/23/2025 0646 Gross per 24 hour  Intake 2360.68 ml  Output 2225 ml  Net 135.68 ml      01/23/2025    4:44 AM 01/22/2025    5:00 AM 01/21/2025    4:33 AM  Last 3 Weights  Weight (lbs) 194 lb 14.2 oz 195 lb 12.3 oz 199 lb 4.8 oz  Weight (kg) 88.4 kg 88.8 kg 90.402 kg      Telemetry Atrial fibrillation with RVR- Personally Reviewed  ECG  Atrial fibrillation with RVR- Personally Reviewed  Physical  Exam  GEN: Chronically ill-appearing gentleman in NAD Neck: No JVD Cardiac: RRR, no murmurs, rubs, or gallops.  Respiratory: Clear to auscultation bilaterally. GI: Soft, nontender, non-distended  MS: Legs wrapped in Ace bandages; No deformity. Neuro:  Nonfocal  Psych: Normal affect   Labs High Sensitivity Troponin:  No results for input(s): TROPONINIHS in the last 720 hours.  Recent Labs  Lab 01/19/25 1010 01/19/25 1315  TRNPT 99* 78*       Chemistry Recent Labs  Lab 01/19/25 1010 01/19/25 1131 01/19/25 1924 01/20/25 0239 01/21/25 0535 01/22/25 0541 01/22/25 1207 01/23/25 0522  NA 136   < >  --    < > 133* 134* 137  135 137  K 3.8   < >  --    < > 4.3 4.1 3.9  4.0 3.9  CL 103  --   --    < > 100 100  --  102  CO2 13*  --   --    < > 15* 16*  --  19*  GLUCOSE 52*  --   --    < > 153* 130*  --  105*  BUN 44*  --   --    < >  60* 71*  --  71*  CREATININE 2.52*  --   --    < > 3.88* 4.13*  --  4.04*  CALCIUM  8.3*  --   --    < > 7.1* 7.1*  --  6.7*  MG  --   --  2.1  --  1.9 1.9  --   --   PROT 5.8*  --   --   --   --   --   --   --   ALBUMIN  2.5*  --   --   --   --   --   --  2.7*  AST 32  --   --   --   --   --   --   --   ALT 15  --   --   --   --   --   --   --   ALKPHOS 89  --   --   --   --   --   --   --   BILITOT 0.7  --   --   --   --   --   --   --   GFRNONAA 25*  --   --    < > 15* 14*  --  14*  ANIONGAP 20*  --   --    < > 18* 19*  --  16*   < > = values in this interval not displayed.    Lipids No results for input(s): CHOL, TRIG, HDL, LABVLDL, LDLCALC, CHOLHDL in the last 168 hours.  Hematology Recent Labs  Lab 01/19/25 0920 01/19/25 1131 01/20/25 0239 01/21/25 0535 01/22/25 1207  WBC 25.9*  --  17.2* 20.8*  --   RBC 4.27  --  4.24 4.18*  --   HGB 13.0   < > 12.8* 12.5* 13.3  13.6  HCT 41.4   < > 40.4 38.5* 39.0  40.0  MCV 97.0  --  95.3 92.1  --   MCH 30.4  --  30.2 29.9  --   MCHC 31.4  --  31.7 32.5  --   RDW 17.2*  --   17.3* 17.2*  --   PLT 275  --  308 297  --    < > = values in this interval not displayed.   Thyroid  No results for input(s): TSH, FREET4 in the last 168 hours.  BNP Recent Labs  Lab 01/19/25 1010  PROBNP 9,529.0*    DDimer No results for input(s): DDIMER in the last 168 hours.   Radiology  ECHO TEE Result Date: 01/22/2025    TRANSESOPHOGEAL ECHO REPORT   Patient Name:   Mario [name] Belsky Date of Exam: 01/22/2025 Medical Rec #:  [id number]       Height:       69.0 in Accession #:    [id number]      Weight:       195.8 lb Date of Birth:  October 11, 1946       BSA:          2.047 m Patient Age:    78 years        BP:           109/78 mmHg Patient Gender: M               HR:           109 bpm. Exam Location:  Inpatient Procedure: Cardiac Doppler, Color Doppler and  Transesophageal Echo (Both            Spectral and Color Flow Doppler were utilized during procedure). Indications:     Cardioversion  History:         Patient has prior history of Echocardiogram examinations, most                  recent 01/20/2025. COPD, Arrythmias:Atrial Fibrillation; Risk                  Factors:Hypertension and Dyslipidemia.  Sonographer:     Tinnie Gosling RDCS Referring Phys:  [phone] MIHAI CROITORU Diagnosing Phys: Jerel Balding MD PROCEDURE: After discussion of the risks and benefits of a TEE, an informed consent was obtained from a family member. The transesophogeal probe was passed without difficulty through the esophogus of the patient. Sedation performed by different physician. The patient was monitored while under deep sedation. Anesthestetic sedation was provided intravenously by Anesthesiology: 60mg  of Propofol . The patient developed no complications during the procedure.  IMPRESSIONS  1. Left ventricular ejection fraction, by estimation, is 25 to 30%. The left ventricle has severely decreased function. The left ventricle demonstrates global hypokinesis.  2. Right ventricular systolic function is moderately reduced. The  right ventricular size is mildly enlarged.  3. Left atrial size was severely dilated. No left atrial/left atrial appendage thrombus was detected.  4. Right atrial size was mildly dilated.  5. The mitral valve is normal in structure. Mild mitral valve regurgitation. No evidence of mitral stenosis.  6. The aortic valve is tricuspid. Aortic valve regurgitation is not visualized. No aortic stenosis is present. Conclusion(s)/Recommendation(s): No LA/LAA thrombus identified. Successful cardioversion performed with restoration of normal sinus rhythm. FINDINGS  Left Ventricle: Left ventricular ejection fraction, by estimation, is 25 to 30%. The left ventricle has severely decreased function. The left ventricle demonstrates global hypokinesis. The left ventricular internal cavity size was normal in size. There is no left ventricular hypertrophy. Right Ventricle: The right ventricular size is mildly enlarged. No increase in right ventricular wall thickness. Right ventricular systolic function is moderately reduced. Left Atrium: Left atrial size was severely dilated. Spontaneous echo contrast was present in the left atrium and left atrial appendage. No left atrial/left atrial appendage thrombus was detected. Right Atrium: Right atrial size was mildly dilated. Pericardium: There is no evidence of pericardial effusion. Mitral Valve: The mitral valve is normal in structure. Mild mitral valve regurgitation. No evidence of mitral valve stenosis. Tricuspid Valve: The tricuspid valve is normal in structure. Tricuspid valve regurgitation is mild. Aortic Valve: The aortic valve is tricuspid. Aortic valve regurgitation is not visualized. No aortic stenosis is present. Pulmonic Valve: The pulmonic valve was normal in structure. Pulmonic valve regurgitation is not visualized. No evidence of pulmonic stenosis. Aorta: The aortic root, ascending aorta, aortic arch and descending aorta are all structurally normal, with no evidence of  dilitation or obstruction. There is minimal (Grade I) plaque involving the descending aorta and aortic arch. IAS/Shunts: No atrial level shunt detected by color flow Doppler. Jerel Balding MD Electronically signed by Jerel Balding MD Signature Date/Time: 01/22/2025/2:33:13 PM    Final    CARDIAC CATHETERIZATION Result Date: 01/22/2025 1. Normal filling pressures. 2. Mild pulmonary arterial hypertension. 3. Normal cardiac output and PAPi.   EP STUDY Result Date: 01/22/2025 See surgical note for result.   Cardiac Studies  TTE - pending  TTE 12/11/24:  IMPRESSIONS     1. Left ventricular ejection fraction, by estimation, is 60 to 65%.  The  left ventricle has normal function. The left ventricle has no regional  wall motion abnormalities. There is mild concentric left ventricular  hypertrophy. Left ventricular diastolic  parameters are indeterminate.   2. Right ventricular systolic function is normal. The right ventricular  size is normal.   3. Left atrial size was moderately dilated.   4. Right atrial size was moderately dilated.   5. The mitral valve is normal in structure. Trivial mitral valve  regurgitation. Moderate mitral stenosis.   6. The aortic valve is tricuspid. There is mild calcification of the  aortic valve. Aortic valve regurgitation is not visualized. Aortic valve  sclerosis/calcification is present, without any evidence of aortic  stenosis.   7. The inferior vena cava is normal in size with greater than 50%  respiratory variability, suggesting right atrial pressure of 3 mmHg.   8. Agitated saline contrast bubble study was negative, with no evidence  of any interatrial shunt.   Patient Profile   Mario [name] is a 79 y.o. male with a hx of persistent atrial fibrillation, HLD, HTN, OSA on CPAP, bronchiectasis, COPD, coronary calcifications on CT who is being seen 01/19/2025 for the evaluation of atrial fibrillation at the request of Dr. Lenor.   Assessment & Plan     #Atrial Fibrillation w/ RVR s/p Ablation (09/19/24) -Patient admitted with acute hypoxic respiratory failure in the setting of SOB and diarrhea found to be in atrial fibrillation with RVR. - The patient has had very hard to control atrial fibrillation over the years and is being rate controlled as an outpatient with metoprolol  and diltiazem . - Currently on IV amiodarone  due to relative hypotension. -Despite multiple days of IV amiodarone  and metoprolol  the patient has remained in significant RVR with worsening renal and cardiac function.  The patient underwent successful TEE plus DCCV on 01/22/25. S/p DCCV 01/22/25 Continue Eliquis  5 mg twice daily Stop IV amiodarone  Start p.o. amiodarone  200 mg twice daily x5 days followed by 200 mg daily.  May not be the best long-term medication for the patient given his COPD; however, we need to keep him in sinus rhythm for now Continue metoprolol  to tartrate 25 mg every 6 hours   #New Acute HFrEF #RV Dysfunction #Acute Hypoxic Respiratory Failure - The patient previously has had a preserved LVEF with his last echocardiogram in 2024 showing normal systolic function. - Limited TTE shows a moderate reduction in LVEF to 35 to 40%.  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 - Ultimately will need to initiate GDMT; however, his blood pressure is a bit tenuous so we will have to be cautious. -RHC on 2//26 showed normal filling pressures Continue metoprolol  as described above Continue holding diuresis for now GDMT limited by renal insufficiency Strict I's and O's -Foley placed Daily weights  #AKI - Patient with a baseline creatinine of 1 now with severe AKI. - Initially was thinking that is cardiorenal; however, RHC demonstrated normal filling pressures. - In the setting of C. difficile positivity, I will give the patient a little bit  of fluid Urine studies Give 250 cc LR bolus Avoiding nephrotoxic medications May need nephrology involvement  #Sepsis Physiology #Possible PNA # C. difficile - Patient has sepsis physiology with leukocytosis, respiratory failure and GI symptoms. - C. difficile positive Management per primary    For questions or updates, please contact [facility] Please consult [email or web address]  for contact info under       Signed, Georganna Archer, MD  01/23/2025, 7:41 AM    "

## 2025-01-23 NOTE — Progress Notes (Signed)
 Hold metoprolol  po for low bp 98/75 (83). Cardiologist notified and aware.   Amado GORMAN Arabia, RN

## 2025-01-23 NOTE — Plan of Care (Signed)
  Problem: Clinical Measurements: Goal: Respiratory complications will improve Outcome: Progressing Goal: Cardiovascular complication will be avoided Outcome: Progressing   Problem: Pain Managment: Goal: General experience of comfort will improve and/or be controlled Outcome: Progressing   Problem: Safety: Goal: Ability to remain free from injury will improve Outcome: Progressing

## 2025-01-23 NOTE — Progress Notes (Signed)
" °   01/23/25 2031  BiPAP/CPAP/SIPAP  BiPAP/CPAP/SIPAP Pt Type Adult  BiPAP/CPAP/SIPAP SERVO  Reason BIPAP/CPAP not in use Other(comment) (PRN orders, BiPAP not indicated at this time. Servo on standby if needed.)    "

## 2025-01-23 NOTE — Hospital Course (Signed)
 Mario [name] was admitted to the hospital with the working diagnosis of sepsis, due to C diff, complicated with respiratory failure    79 yo male with the past medical history of hypertension, hyperlipidemia, atrial fibrillation, coronary artery disease and COPD with bronchiectasis who presented with nausea, vomiting and diarrhea.  Recent hospitalization 12/20 to 12/11/24 for left hand infection, he was discharged on linezoild and Augmentin , completed therapy on 01/13/25.  Reported 3 days of diarrhea, and worsening dyspnea, EMS was called and he was found in respiratory distress, hypoxic, hypotensive and tachycardic. In the ED he was placed on Bipap   On his initial physical examination blood pressure 69/59, 102/82, HR 162, RR 19 and 02 saturation 97% Lungs with no wheezing or rhonchi, heart with S1 and S2 present, irregularly irregular, abdomen, with no distention and positive lower extremity edema.   Patient initially placed on IV furosemide  for diuresis 02/04 direct current cardioversion and cardiac catheterization.  Improved filling pressures. Added IV fluids for worsening renal failure  02/05 improving serum cr

## 2025-01-23 NOTE — TOC Progression Note (Addendum)
 Transition of Care [facility]) - Progression Note    Patient Details  Name: Mario [name] MRN: [id number] Date of Birth: 06/23/1946  [facility] of [facility]) CM/SW Contact  Isaiah Public, LCSWA Phone Number: 01/23/2025, 10:36 AM  Clinical Narrative:     CSW spoke with Bard with [facility] who is going to review patients referral and give CSW a call back on if facility can make SNF bed offer. CSW awaiting to hear back and then will follow up with family with additional SNF bed offers.  Update- CSW LVM for patients spouse Reena. CSW awaiting call back to discuss patients SNF choice.  Update- CSW spoke with patients spouse Reena and provided additional SNF bed offers.  CSW informed patients spouse that Bard with [facility] plans on following up with CSW shortly to let CSW know if facility can offer. Patients spouse informed CSW that first choice for patient would be [facility] and 2nd choice would be [facility].  Update- Drew with [facility] followed up with CSW and informed CSW that facility unable to offer SNF bed for patient.  Update- Lauren with [facility] confirmed they can offer SNF bed for patient. Nicki with [facility] confirmed facility can offer SNF bed for patient on Monday if receives insurance auth approval by then and medically ready. CSW will inform patients spouse Reena.  Update- CSW informed patients spouse Reena . Reena would like to think about the two offers. CSW will follow up with patients spouse tomorrow.  Expected Discharge Plan: Skilled Nursing Facility Barriers to Discharge: Continued Medical Work up               Expected Discharge Plan and Services In-house Referral: Clinical Social Work Discharge Planning Services: CM Consult Post Acute Care Choice: Skilled Nursing Facility Living arrangements for the past 2 months: Single Family Home                                       Social Drivers of Health (SDOH) Interventions SDOH  Screenings   Food Insecurity: No Food Insecurity (01/20/2025)  Recent Concern: Food Insecurity - Food Insecurity Present (12/07/2024)  Housing: Low Risk (01/20/2025)  Recent Concern: Housing - High Risk (12/07/2024)  Transportation Needs: No Transportation Needs (01/20/2025)  Utilities: Not At Risk (01/20/2025)  Recent Concern: Utilities - At Risk (12/07/2024)  Alcohol Screen: Low Risk (05/08/2024)  Depression (PHQ2-9): Low Risk (12/13/2024)  Financial Resource Strain: Medium Risk (10/19/2024)  Physical Activity: Inactive (10/19/2024)  Social Connections: Socially Integrated (01/20/2025)  Stress: No Stress Concern Present (10/19/2024)  Tobacco Use: Medium Risk (01/19/2025)  Health Literacy: Adequate Health Literacy (05/08/2024)    Readmission Risk Interventions    12/11/2024    4:13 PM 02/18/2024   10:38 AM  Readmission Risk Prevention Plan  Post Dischage Appt Complete Complete  Medication Screening Complete Complete  Transportation Screening Complete Complete

## 2025-01-23 NOTE — Progress Notes (Signed)
" ° °  Heart Failure Stewardship Pharmacist Progress Note   PCP: [name] DASEN, MD PCP-Cardiologist: Dorn Lesches, MD    HPI:  79 yo M with PMH of afib, HTN, HLD, OSA on CPAP, COPD.   Presented to the ED on 2/1 with shortness of breath and diarrhea for the last few days. EKG showed afib with RVR. proBNP elevated. CXR with pulmonary edema and small bilateral pleural effusions. Started on IV amiodarone . ECHO 2/6 with LVEF 35-40%, global hypokinesis, RV moderately reduced, mild MR. Underwent successful TEE/DCCV on 2/4 - EF 25-30%. RHC on 2/4 with RA 7, PA 30, wedge 11, CO 5, CI 2.5, PAPi 3.   States he still has some shortness of breath. He states LE edema is about the same. Reviewed goals of therapy for GDMT. No questions at this time. Agreeable to using [facility] TOC pharmacy at discharge and is interested in transitioning [facility] to [facility].   Current HF Medications: Beta Blocker: metoprolol  tartrate 25 mg q6h  Prior to admission HF Medications: Beta blocker: metoprolol  XL 100 mg daily  Pertinent Lab Values: Serum creatinine 4.04, BUN 71, Potassium 3.9, Sodium 137, proBNP 9529, Magnesium 1.9  Vital Signs: Weight: 194 lbs (admission weight: 192 lbs) Blood pressure: 90-100/70s  Heart rate: 70s  I/O: net -1.1L yesterday; net +1.6L since admission  Medication Assistance / Insurance Benefits Check: Does the patient have prescription insurance?  Yes Type of insurance plan: [employer]  Outpatient Pharmacy:  [facility] Is the patient willing to use [facility] TOC pharmacy at discharge? Yes Is the patient willing to transition their outpatient pharmacy to utilize a [facility] outpatient pharmacy?   Yes    Assessment: 1. 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 NYHA class III symptoms. - Off lasix  - last dose of IV lasix  given on 2/3 and yesterday received 40 mg PO. Creatinine remains  elevated 4s. Strict I/Os and daily weights. Keep K>4 and Mg>2. - On metoprolol  tartrate 25 mg q6h - consider consolidating to metoprolol  XL now that he is post DCCV and with HFrEF - GDMT limited by AKI   Plan: 1) Medication changes recommended at this time: - Consolidate metoprolol  tartrate to metoprolol  succinate - Further GDMT recs pending improvement in renal function  2) Patient assistance: - Farxiga copay $45 - Jardiance copay $50 - Entresto copay $8  3)  Education  - Patient has been educated on current HF medications and potential additions to HF medication regimen - Patient verbalizes understanding that over the next few months, these medication doses may change and more medications may be added to optimize HF regimen - Patient has been educated on basic disease state pathophysiology and goals of therapy   Duwaine Plant, PharmD, BCPS Heart Failure Stewardship Pharmacist Phone [phone]   "

## 2025-01-23 NOTE — Evaluation (Addendum)
 Occupational Therapy Evaluation Patient Details Name: Mario [name] MRN: [id number] DOB: 07-04-1946 Today's Date: 01/23/2025   History of Present Illness   Patient is a 79 y/o male admitted 01/19/2025 with weakness, SOB, N/V/D, found to be in a-fib w/RVR to 190s, hypoxic 71% on RA.  Positive for C-diff with SIRS/sepsis and COPD/bronchiectasis exacerbation. Pt s/p R heart cath and TEE cardioversion 01/22/25. PMH positive for A-fib on Eliquis , CAD, HTN, HLD, COPD and bronchiectasis.     Clinical Impressions Per chart review pt was in a lift chair and requiring assist with ADLS but about 1 month prior was able to ambulate to the bathroom. At this time initially was perseverating on what to order for breakfast and required assist with calling/ordering. He then agreed to complete bed mobility with mod x2 to EOB and required mod-CGA while sitting. HE then reported that is enough and wanted lay back into bed with mod x2. He required max x2 to reposition in bed as then breakfast came and required assist with opening all items and positioning due to BUE limitations. At this time Acute Occupational Therapy to follow but would benefit from continued inpatient follow up therapy, <3 hours/day.      If plan is discharge home, recommend the following:   Two people to help with walking and/or transfers;A lot of help with bathing/dressing/bathroom;Assistance with cooking/housework;Assistance with feeding;Direct supervision/assist for medications management;Direct supervision/assist for financial management;Assist for transportation;Help with stairs or ramp for entrance;Supervision due to cognitive status     Functional Status Assessment   Patient has had a recent decline in their functional status and demonstrates the ability to make significant improvements in function in a reasonable and predictable amount of time.     Equipment Recommendations    (TBD)     Recommendations for Other Services          Precautions/Restrictions   Precautions Precautions: Fall Recall of Precautions/Restrictions: Impaired Restrictions Weight Bearing Restrictions Per Provider Order: No     Mobility Bed Mobility Overal bed mobility: Needs Assistance Bed Mobility: Sit to Supine, Sit to Sidelying     Supine to sit: Mod assist, +2 for physical assistance, +2 for safety/equipment, HOB elevated, Used rails Sit to supine: Mod assist, +2 for physical assistance, +2 for safety/equipment, HOB elevated, Used rails   General bed mobility comments: Pt needs cues on how to assist as reporting I normally use a lift chair    Transfers                   General transfer comment: deffered as pt once in sitting reported that is enough      Balance Overall balance assessment: Needs assistance Sitting-balance support: Feet supported, Bilateral upper extremity supported Sitting balance-Leahy Scale: Poor Sitting balance - Comments: pt intially needed heavy mod assist but then was able to go to CGA. Pt sat <5 mins                                   ADL either performed or assessed with clinical judgement   ADL Overall ADL's : Needs assistance/impaired Eating/Feeding: Set up;Minimal assistance;Sitting Eating/Feeding Details (indicate cue type and reason): depeding on position, needed set up opening all continares Grooming: Wash/dry face;Set up;Sitting   Upper Body Bathing: Maximal assistance;Sitting   Lower Body Bathing: Total assistance;Bed level   Upper Body Dressing : Maximal assistance   Lower Body Dressing: Total assistance;Bed level  Toilet Transfer: Total assistance   Toileting- Clothing Manipulation and Hygiene: Total assistance;Bed level               Vision         Perception         Praxis         Pertinent Vitals/Pain Pain Assessment Pain Assessment: Faces Faces Pain Scale: Hurts little more Facial Expression: Relaxed, neutral Body  Movements: Absence of movements Muscle Tension: Relaxed Compliance with ventilator (intubated pts.): N/A Vocalization (extubated pts.): N/A CPOT Total: 0 Pain Location: catheter/R hand at IV site Pain Descriptors / Indicators: Discomfort, Grimacing Pain Intervention(s): Limited activity within patient's tolerance, Monitored during session, Repositioned     Extremity/Trunk Assessment Upper Extremity Assessment Upper Extremity Assessment: LUE deficits/detail;RUE deficits/detail RUE Deficits / Details: Pt reporting they can not use RUE but when completiong of tasks noted some AROM with slight flexion/abduction to about 20 deg but was able to hold onto items RUE Coordination: decreased gross motor LUE Deficits / Details: shoulder AROM WFL, strength 3+/5; elbow flex/ext 3+/5           Communication Communication Communication: Impaired Factors Affecting Communication: Hearing impaired   Cognition Arousal: Alert   Cognition: No family/caregiver present to determine baseline             OT - Cognition Comments: Pt was able to report they were not home but could not determine location. Often perseverated on things and needed cues to attend to therapist. Per nursing they were hallucinating this morning.                 Following commands: Impaired Following commands impaired: Follows one step commands inconsistently     Cueing  General Comments   Cueing Techniques: Verbal cues;Tactile cues;Visual cues      Exercises     Shoulder Instructions      Home Living Family/patient expects to be discharged to:: Private residence Living Arrangements: Spouse/significant other Available Help at Discharge: Family Type of Home: House Home Access: Stairs to enter Secretary/administrator of Steps: 3 Entrance Stairs-Rails: Right Home Layout: Two level Alternate Level Stairs-Number of Steps: has not been able to go upstairs for a little while (since March), sleeping in  recliner, has 15 steps Alternate Level Stairs-Rails: Left (bil for the first 4 steps) Bathroom Shower/Tub: Chief Strategy Officer: Standard     Home Equipment: Agricultural Consultant (2 wheels);Cane - single point;Grab bars - tub/shower;Hand held shower head;Lift chair;Wheelchair - manual          Prior Functioning/Environment Prior Level of Function : Needs assist             Mobility Comments: staying in lift recliner for about a month per wife and she was changing him, got wheelchair but she could not help him into it; prior to last month walking with A and RW to bathroom ADLs Comments: family not present to see level particiaption but sounded like wife completed most cre    OT Problem List: Decreased strength;Decreased range of motion;Decreased activity tolerance;Impaired balance (sitting and/or standing);Decreased safety awareness;Decreased knowledge of use of DME or AE;Pain   OT Treatment/Interventions: Self-care/ADL training;Therapeutic exercise;DME and/or AE instruction;Therapeutic activities;Patient/family education;Balance training;Cognitive remediation/compensation      OT Goals(Current goals can be found in the care plan section)   Acute Rehab OT Goals Patient Stated Goal: to eat OT Goal Formulation: With patient Time For Goal Achievement: 02/06/25 Potential to Achieve Goals: Fair   OT Frequency:  Min 2X/week    Co-evaluation              AM-PAC OT 6 Clicks Daily Activity     Outcome Measure Help from another person eating meals?: A Little Help from another person taking care of personal grooming?: A Lot Help from another person toileting, which includes using toliet, bedpan, or urinal?: Total Help from another person bathing (including washing, rinsing, drying)?: Total Help from another person to put on and taking off regular upper body clothing?: A Lot Help from another person to put on and taking off regular lower body clothing?: Total 6  Click Score: 10   End of Session Nurse Communication: Mobility status  Activity Tolerance: Patient limited by fatigue Patient left: in bed;with call bell/phone within reach;with bed alarm set (in chair position)  OT Visit Diagnosis: Unsteadiness on feet (R26.81);Other abnormalities of gait and mobility (R26.89);Repeated falls (R29.6);Muscle weakness (generalized) (M62.81);Pain Pain - part of body:  (cath site)                Time: 9041-8973 OT Time Calculation (min): 28 min Charges:  OT General Charges $OT Visit: 1 Visit OT Evaluation $OT Eval Moderate Complexity: 1 Mod  Raynor Calcaterra K OTR/L  Acute Rehab Services  [phone] office number   Warrick Berber 01/23/2025, 10:42 AM

## 2025-01-23 NOTE — Assessment & Plan Note (Signed)
 Paroxysmal atrial fibrillation, now sp direct current cardioversion Telemetry sinus with frequent PAC Continue metoprolol  and anticoagulation with apixban  Continue with amiodarone 

## 2025-01-23 NOTE — Assessment & Plan Note (Addendum)
 Echocardiogram with reduced LV systolic function with EF 35 to 40%,  Global hypokinesis, RVSP 40,5 RV systolic function with moderate reduction, no significant valvular disease.   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

## 2025-01-23 NOTE — Progress Notes (Addendum)
 Physical Therapy Treatment Patient Details Name: Mario [name] MRN: [id number] DOB: 08/08/1946 Today's Date: 01/23/2025   History of Present Illness Patient is a 79 y/o male admitted 01/19/2025 with weakness, SOB, N/V/D, found to be in a-fib w/RVR to 190s, hypoxic 71% on RA.  Positive for C-diff with SIRS/sepsis and COPD/bronchiectasis exacerbation. Pt s/p R heart cath and TEE  01/22/25. PMH positive for A-fib on Eliquis , CAD, HTN, HLD, COPD and bronchiectasis.    PT Comments  Pt received in supine and agreeable to PT session. Pt was perseverating on needing to order breakfast and not wanting to mobilize prior to eating. Pt was agreeable to sit on the EOB with ModAx2 required for assist. Frequent cueing for technique and sequencing. Pt was able to sit on the EOB for 1-2 minutes before pt requested to return to supine. Performed a few LE exercises in supine before his breakfast arrived. Positioned bed in chair position with assist to set-up breakfast tray. Continue to recommend <3hrs post acute rehab to reduce falls risk and decrease caregiver burden. Will continue to follow acutely.     If plan is discharge home, recommend the following: A lot of help with bathing/dressing/bathroom;Two people to help with walking and/or transfers   Can travel by private vehicle     No  Equipment Recommendations  None recommended by PT       Precautions / Restrictions Precautions Precautions: Fall Recall of Precautions/Restrictions: Impaired Restrictions Weight Bearing Restrictions Per Provider Order: No     Mobility  Bed Mobility Overal bed mobility: Needs Assistance Bed Mobility: Sit to Supine, Sit to Sidelying    Supine to sit: Mod assist, +2 for physical assistance, +2 for safety/equipment, HOB elevated, Used rails Sit to supine: Mod assist, +2 for physical assistance, +2 for safety/equipment, HOB elevated, Used rails   General bed mobility comments: Pt needs cues on how to assist as reporting I  normally use a lift chair   Transfers  General transfer comment: deffered as pt once in sitting reported that is enough      Balance Overall balance assessment: Needs assistance Sitting-balance support: Feet supported, Bilateral upper extremity supported Sitting balance-Leahy Scale: Poor Sitting balance - Comments: pt intially needed heavy mod assist but then was able to go to CGA. Pt sat <5 mins       Communication Communication Communication: Impaired Factors Affecting Communication: Hearing impaired  Cognition Arousal: Alert    Following commands: Impaired Following commands impaired: Follows one step commands inconsistently    Cueing Cueing Techniques: Verbal cues, Tactile cues, Visual cues  Exercises General Exercises - Lower Extremity Ankle Circles/Pumps: AROM, Both, 10 reps, Supine Straight Leg Raises: AAROM, Both, 5 reps, Supine        Pertinent Vitals/Pain Pain Assessment Pain Assessment: Faces Faces Pain Scale: Hurts little more Pain Location: catheter/R hand at IV site Pain Descriptors / Indicators: Discomfort, Grimacing    Home Living Family/patient expects to be discharged to:: Private residence Living Arrangements: Spouse/significant other Available Help at Discharge: Family Type of Home: House Home Access: Stairs to enter Entrance Stairs-Rails: Right Entrance Stairs-Number of Steps: 3 Alternate Level Stairs-Number of Steps: has not been able to go upstairs for a little while (since March), sleeping in recliner, has 15 steps Home Layout: Two level Home Equipment: Agricultural Consultant (2 wheels);Cane - single point;Grab bars - tub/shower;Hand held shower head;Lift chair;Wheelchair - manual          PT Goals (current goals can now be found in the care  plan section) Acute Rehab PT Goals PT Goal Formulation: With patient/family Time For Goal Achievement: 02/04/25 Potential to Achieve Goals: Fair Progress towards PT goals: Progressing toward goals     Frequency    Min 2X/week       AM-PAC PT 6 Clicks Mobility   Outcome Measure  Help needed turning from your back to your side while in a flat bed without using bedrails?: A Lot Help needed moving from lying on your back to sitting on the side of a flat bed without using bedrails?: Total Help needed moving to and from a bed to a chair (including a wheelchair)?: Total Help needed standing up from a chair using your arms (e.g., wheelchair or bedside chair)?: Total Help needed to walk in hospital room?: Total Help needed climbing 3-5 steps with a railing? : Total 6 Click Score: 7    End of Session Equipment Utilized During Treatment: Oxygen Activity Tolerance: Patient limited by fatigue;Patient limited by pain Patient left: in bed;with call bell/phone within reach;with bed alarm set Nurse Communication: Mobility status;Need for lift equipment PT Visit Diagnosis: Other abnormalities of gait and mobility (R26.89);Muscle weakness (generalized) (M62.81);Adult, failure to thrive (R62.7)     Time: 9041-8977 PT Time Calculation (min) (ACUTE ONLY): 24 min  Charges:    $Therapeutic Exercise: 8-22 mins PT General Charges $$ ACUTE PT VISIT: 1 Visit                    Kate ORN, PT, DPT Secure Chat Preferred  Rehab Office [phone]   Kate BRAVO Wendolyn 01/23/2025, 11:08 AM

## 2025-01-24 ENCOUNTER — Inpatient Hospital Stay (HOSPITAL_COMMUNITY)

## 2025-01-24 ENCOUNTER — Other Ambulatory Visit: Payer: Self-pay

## 2025-01-24 LAB — GLUCOSE, CAPILLARY
Glucose-Capillary: 69 mg/dL — ABNORMAL LOW (ref 70–99)
Glucose-Capillary: 73 mg/dL (ref 70–99)
Glucose-Capillary: 93 mg/dL (ref 70–99)
Glucose-Capillary: 77 mg/dL (ref 70–99)
Glucose-Capillary: 56 mg/dL — ABNORMAL LOW (ref 70–99)
Glucose-Capillary: 82 mg/dL (ref 70–99)

## 2025-01-24 LAB — CBC
HCT: 38.7 % — ABNORMAL LOW (ref 39.0–52.0)
Hemoglobin: 12.1 g/dL — ABNORMAL LOW (ref 13.0–17.0)
MCH: 29.2 pg (ref 26.0–34.0)
MCHC: 31.3 g/dL (ref 30.0–36.0)
MCV: 93.5 fL (ref 80.0–100.0)
Platelets: 137 10*3/uL — ABNORMAL LOW (ref 150–400)
RBC: 4.14 MIL/uL — ABNORMAL LOW (ref 4.22–5.81)
RDW: 17.6 % — ABNORMAL HIGH (ref 11.5–15.5)
WBC: 26.7 10*3/uL — ABNORMAL HIGH (ref 4.0–10.5)
nRBC: 0 % (ref 0.0–0.2)

## 2025-01-24 LAB — RENAL FUNCTION PANEL
Albumin: 2.4 g/dL — ABNORMAL LOW (ref 3.5–5.0)
Anion gap: 14 (ref 5–15)
BUN: 67 mg/dL — ABNORMAL HIGH (ref 8–23)
CO2: 20 mmol/L — ABNORMAL LOW (ref 22–32)
Calcium: 6.5 mg/dL — ABNORMAL LOW (ref 8.9–10.3)
Chloride: 104 mmol/L (ref 98–111)
Creatinine, Ser: 3.44 mg/dL — ABNORMAL HIGH (ref 0.61–1.24)
GFR, Estimated: 17 mL/min — ABNORMAL LOW
Glucose, Bld: 67 mg/dL — ABNORMAL LOW (ref 70–99)
Phosphorus: 5.2 mg/dL — ABNORMAL HIGH (ref 2.5–4.6)
Potassium: 3.7 mmol/L (ref 3.5–5.1)
Sodium: 139 mmol/L (ref 135–145)

## 2025-01-24 LAB — CULTURE, BLOOD (ROUTINE X 2)
Culture: NO GROWTH
Culture: NO GROWTH

## 2025-01-24 LAB — URINALYSIS, COMPLETE (UACMP) WITH MICROSCOPIC
Bilirubin Urine: NEGATIVE
Glucose, UA: NEGATIVE mg/dL
Ketones, ur: NEGATIVE mg/dL
Nitrite: NEGATIVE
Protein, ur: 100 mg/dL — AB
RBC / HPF: >50 RBC/hpf (ref 0–5)
Specific Gravity, Urine: 1.015 (ref 1.005–1.030)
pH: 5 (ref 5.0–8.0)

## 2025-01-24 LAB — CREATININE, URINE, RANDOM: Creatinine, Urine: 75 mg/dL

## 2025-01-24 LAB — LIPOPROTEIN A (LPA): Lipoprotein (a): 54.7 nmol/L — ABNORMAL HIGH

## 2025-01-24 LAB — UREA NITROGEN, URINE: Urea Nitrogen, Ur: 421 mg/dL

## 2025-01-24 LAB — SODIUM, URINE, RANDOM: Sodium, Ur: <30 mmol/L

## 2025-01-24 MED ORDER — LACTATED RINGERS IV BOLUS
500.0000 mL | Freq: Once | INTRAVENOUS | Status: AC
  Administered 2025-01-24: 500 mL via INTRAVENOUS

## 2025-01-24 MED ORDER — AMIODARONE LOAD VIA INFUSION
150.0000 mg | Freq: Once | INTRAVENOUS | Status: AC
  Administered 2025-01-24: 150 mg via INTRAVENOUS
  Filled 2025-01-24: qty 83.34

## 2025-01-24 MED ORDER — AMIODARONE HCL IN DEXTROSE 360-4.14 MG/200ML-% IV SOLN
60.0000 mg/h | INTRAVENOUS | Status: DC
  Administered 2025-01-24: 60 mg/h via INTRAVENOUS
  Filled 2025-01-24: qty 200

## 2025-01-24 MED ORDER — AMIODARONE HCL IN DEXTROSE 360-4.14 MG/200ML-% IV SOLN
30.0000 mg/h | INTRAVENOUS | Status: DC
  Filled 2025-01-24 (×2): qty 200

## 2025-01-24 MED ORDER — AMIODARONE HCL IN DEXTROSE 360-4.14 MG/200ML-% IV SOLN
60.0000 mg/h | INTRAVENOUS | Status: AC
  Administered 2025-01-24 (×2): 60 mg/h via INTRAVENOUS
  Filled 2025-01-24 (×4): qty 200

## 2025-01-24 MED ORDER — SODIUM CHLORIDE 0.9% FLUSH
10.0000 mL | Freq: Two times a day (BID) | INTRAVENOUS | Status: AC
  Administered 2025-01-24: 10 mL

## 2025-01-24 MED ORDER — SODIUM CHLORIDE 0.9% FLUSH: 10.0000 mL | INTRAVENOUS | Status: AC | PRN

## 2025-01-24 MED ORDER — MIDODRINE HCL 5 MG PO TABS
10.0000 mg | ORAL_TABLET | ORAL | Status: AC
  Administered 2025-01-24: 10 mg via ORAL
  Filled 2025-01-24: qty 2

## 2025-01-24 MED ORDER — SODIUM CHLORIDE 0.9 % IV SOLN: INTRAVENOUS | Status: AC

## 2025-01-24 NOTE — Progress Notes (Signed)
" °   01/24/25 0121  BiPAP/CPAP/SIPAP  $ Non-Invasive Ventilator  Non-Invasive Vent Subsequent  BiPAP/CPAP/SIPAP Pt Type Adult  BiPAP/CPAP/SIPAP SERVO  Mask Type Full face mask  Dentures removed? Not applicable  Mask Size Large  Respiratory Rate 26 breaths/min  IPAP 10 cmH20  EPAP 5 cmH2O  Pressure Support 5 cmH20  PEEP 5 cmH20  FiO2 (%) 40 %  Minute Ventilation 13.6  Leak 49  Peak Inspiratory Pressure (PIP) 12  Tidal Volume (Vt) 695  Patient Home Machine No  Patient Home Mask No  Patient Home Tubing No  Auto Titrate No  Press High Alarm 25 cmH2O  CPAP/SIPAP surface wiped down Yes  Device Plugged into RED Power Outlet Yes    "

## 2025-01-24 NOTE — Progress Notes (Signed)
 Orthopedic Tech Progress Note Patient Details:  Mario [name] 07/15/1946 [id number] UBs were removed by RN's to perform skin assessment. RN called for help of removal but the staff were able to remove the bandages. Patient ID: Mario [name], male   DOB: 07-Aug-1946, 79 y.o.   MRN: [id number]  [name] 01/24/2025, 7:16 AM

## 2025-01-24 NOTE — Progress Notes (Signed)
 "  Rounding Note   Patient Name: Mario [name] Date of Encounter: 01/24/2025  [facility] Cardiologist: Dorn Lesches, MD   Subjective - No acute events overnight - Patient went back into atrial fibrillation with RVR unfortunately. - He denies chest pain and SOB.  Scheduled Meds:  amiodarone   150 mg Intravenous Once   apixaban   5 mg Oral BID   atorvastatin   40 mg Oral Daily   budesonide  (PULMICORT ) nebulizer solution  0.5 mg Nebulization BID   Chlorhexidine  Gluconate Cloth  6 each Topical Daily   metoprolol  tartrate  25 mg Oral Q6H   montelukast   10 mg Oral Daily   nystatin    Topical BID   pantoprazole   40 mg Oral BID   sodium chloride  flush  3 mL Intravenous Q12H   tamsulosin   0.4 mg Oral QPC supper   vancomycin   125 mg Oral QID   Continuous Infusions:  sodium chloride  100 mL/hr at 01/24/25 0600   amiodarone  60 mg/hr (01/24/25 1408)   Followed by   amiodarone      PRN Meds: acetaminophen  **OR** acetaminophen , acetaminophen , albuterol , dextrose , ondansetron  (ZOFRAN ) IV   Vital Signs  Vitals:   01/24/25 0752 01/24/25 0801 01/24/25 0810 01/24/25 1120  BP: 105/72  102/74 104/77  Pulse: (!) 101 (!) 108 82   Resp: 16  20 16   Temp: 98.6 F (37 C)   97.9 F (36.6 C)  TempSrc: Oral   Oral  SpO2: 100% 100% 96%   Weight:      Height:        Intake/Output Summary (Last 24 hours) at 01/24/2025 1411 Last data filed at 01/24/2025 1105 Gross per 24 hour  Intake 3438.19 ml  Output 1140 ml  Net 2298.19 ml      01/24/2025    5:00 AM 01/23/2025    4:44 AM 01/22/2025    5:00 AM  Last 3 Weights  Weight (lbs) 200 lb 9.9 oz 194 lb 14.2 oz 195 lb 12.3 oz  Weight (kg) 91 kg 88.4 kg 88.8 kg      Telemetry Atrial fibrillation with RVR- Personally Reviewed  ECG  No new ECG  Physical Exam  GEN: Chronically ill-appearing gentleman in NAD Neck: No JVD Cardiac: Tachycardic with irregularly irregular rhythm, no murmurs, rubs, or gallops.  Respiratory: Clear to  auscultation bilaterally. GI: Soft, nontender, non-distended  MS: 2+ pitting edema bilaterally in the lower extremities; No deformity. Neuro:  Nonfocal  Psych: Normal affect   Labs High Sensitivity Troponin:  No results for input(s): TROPONINIHS in the last 720 hours.  Recent Labs  Lab 01/19/25 1010 01/19/25 1315  TRNPT 99* 78*       Chemistry Recent Labs  Lab 01/19/25 1010 01/19/25 1131 01/19/25 1924 01/20/25 0239 01/21/25 0535 01/22/25 0541 01/22/25 1207 01/23/25 0522 01/24/25 0453  NA 136   < >  --    < > 133* 134* 137  135 137 139  K 3.8   < >  --    < > 4.3 4.1 3.9  4.0 3.9 3.7  CL 103  --   --    < > 100 100  --  102 104  CO2 13*  --   --    < > 15* 16*  --  19* 20*  GLUCOSE 52*  --   --    < > 153* 130*  --  105* 67*  BUN 44*  --   --    < > 60* 71*  --  71* 67*  CREATININE 2.52*  --   --    < > 3.88* 4.13*  --  4.04* 3.44*  CALCIUM  8.3*  --   --    < > 7.1* 7.1*  --  6.7* 6.5*  MG  --   --  2.1  --  1.9 1.9  --   --   --   PROT 5.8*  --   --   --   --   --   --   --   --   ALBUMIN  2.5*  --   --   --   --   --   --  2.7* 2.4*  AST 32  --   --   --   --   --   --   --   --   ALT 15  --   --   --   --   --   --   --   --   ALKPHOS 89  --   --   --   --   --   --   --   --   BILITOT 0.7  --   --   --   --   --   --   --   --   GFRNONAA 25*  --   --    < > 15* 14*  --  14* 17*  ANIONGAP 20*  --   --    < > 18* 19*  --  16* 14   < > = values in this interval not displayed.    Lipids No results for input(s): CHOL, TRIG, HDL, LABVLDL, LDLCALC, CHOLHDL in the last 168 hours.  Hematology Recent Labs  Lab 01/20/25 0239 01/21/25 0535 01/22/25 1207 01/24/25 0453  WBC 17.2* 20.8*  --  26.7*  RBC 4.24 4.18*  --  4.14*  HGB 12.8* 12.5* 13.3  13.6 12.1*  HCT 40.4 38.5* 39.0  40.0 38.7*  MCV 95.3 92.1  --  93.5  MCH 30.2 29.9  --  29.2  MCHC 31.7 32.5  --  31.3  RDW 17.3* 17.2*  --  17.6*  PLT 308 297  --  137*   Thyroid  No results for input(s):  TSH, FREET4 in the last 168 hours.  BNP Recent Labs  Lab 01/19/25 1010  PROBNP 9,529.0*    DDimer No results for input(s): DDIMER in the last 168 hours.   Radiology  No results found.   Cardiac Studies  TTE - pending  TTE 12/11/24:  IMPRESSIONS     1. Left ventricular ejection fraction, by estimation, is 60 to 65%. The  left ventricle has normal function. The left ventricle has no regional  wall motion abnormalities. There is mild concentric left ventricular  hypertrophy. Left ventricular diastolic  parameters are indeterminate.   2. Right ventricular systolic function is normal. The right ventricular  size is normal.   3. Left atrial size was moderately dilated.   4. Right atrial size was moderately dilated.   5. The mitral valve is normal in structure. Trivial mitral valve  regurgitation. Moderate mitral stenosis.   6. The aortic valve is tricuspid. There is mild calcification of the  aortic valve. Aortic valve regurgitation is not visualized. Aortic valve  sclerosis/calcification is present, without any evidence of aortic  stenosis.   7. The inferior vena cava is normal in size with greater than 50%  respiratory variability, suggesting right atrial pressure of 3 mmHg.  8. Agitated saline contrast bubble study was negative, with no evidence  of any interatrial shunt.   Patient Profile   Mario [name] is a 79 y.o. male with a hx of persistent atrial fibrillation, HLD, HTN, OSA on CPAP, bronchiectasis, COPD, coronary calcifications on CT who is being seen 01/19/2025 for the evaluation of atrial fibrillation at the request of Dr. Lenor.   Assessment & Plan    #Atrial Fibrillation w/ RVR s/p Ablation (09/19/24) -Patient admitted with acute hypoxic respiratory failure in the setting of SOB and diarrhea found to be in atrial fibrillation with RVR. - The patient has had very hard to control atrial fibrillation over the years and is being rate controlled as an  outpatient with metoprolol  and diltiazem . - Currently on IV amiodarone  due to relative hypotension. -Despite multiple days of IV amiodarone  and metoprolol  the patient has remained in significant RVR with worsening renal and cardiac function.  The patient underwent successful TEE plus DCCV on 01/22/25. -The patient does have recurrence of his atrial fibrillation with RVR on 01/24/2025.  Will restart IV amiodarone  at this time as he is not a great candidate for other treatments. S/p DCCV 01/22/25 Continue Eliquis  5 mg twice daily Stop oral amiodarone  Start IV amiodarone  Continue metoprolol  to tartrate 25 mg every 6 hours   #New Acute HFrEF #RV Dysfunction #Acute Hypoxic Respiratory Failure - The patient previously has had a preserved LVEF with his last echocardiogram in 2024 showing normal systolic function. - Limited TTE shows a moderate reduction in LVEF to 35 to 40%.  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 - Ultimately will need to initiate GDMT; however, his blood pressure is a bit tenuous so we will have to be cautious. -RHC on 2//26 showed normal filling pressures Continue metoprolol  as described above Continue holding diuresis for now -creatinine improving with fluid administration GDMT limited by renal insufficiency Strict I's and O's -Foley placed Daily weights  # Prerenal vs Intrinsic AKI - Patient with a baseline creatinine of 1 now with severe AKI. - Initially was thinking that is cardiorenal; however, RHC demonstrated normal filling pressures. - FEUrea = 44.5% suggestive of intrinsic injury.  Creatinine appears to be getting better with fluid administration. Urine studies Avoiding nephrotoxic medications May need nephrology involvement  #Sepsis Physiology #Possible PNA # C. difficile - Patient has sepsis physiology with leukocytosis, respiratory failure  and GI symptoms. - C. difficile positive Management per primary    For questions or updates, please contact [facility] Please consult [email or web address] for contact info under       Signed, Georganna Archer, MD  01/24/2025, 2:11 PM    "

## 2025-01-24 NOTE — Progress Notes (Incomplete)
 TRH night cross cover note:   I was notified by the patient's RN  ***   Afib rvr. Small ivf bolus, low bp. Amiodarone  drip. Hold scheduled dose of bb. Cards to be updated.    Eva Pore, DO Hospitalist

## 2025-01-24 NOTE — TOC Progression Note (Addendum)
 Transition of Care [facility]) - Progression Note    Patient Details  Name: Mario [name] MRN: [id number] Date of Birth: [birth year]  [facility] CM/SW Contact  Isaiah Public, LCSWA Phone Number: 01/24/2025, 10:47 AM  Clinical Narrative:     CSW LVM for patients spouse Reena. CSW awaiting call back to discuss patients SNF choice.  Update- Patients spouse plans on going to tour [facility] today . Patients daughter plans on following back up afterwards on if patient accepted [facility] as SNF choice.  Update- CSW spoke with Reena patients spouse who confirmed patient accepts SNF bed offer with [facility]. CSW informed Nicki with [facility]. CSW will inform facility to start insurance authorization for patient closer to patient being medically ready.  Expected Discharge Plan: Skilled Nursing Facility Barriers to Discharge: Continued Medical Work up               Expected Discharge Plan and Services In-house Referral: Clinical Social Work Discharge Planning Services: CM Consult Post Acute Care Choice: Skilled Nursing Facility Living arrangements for the past 2 months: Single Family Home                                       Social Drivers of Health (SDOH) Interventions SDOH Screenings   Food Insecurity: No Food Insecurity (01/20/2025)  Recent Concern: Food Insecurity - Food Insecurity Present (12/07/2024)  Housing: Unknown (01/23/2025)  Recent Concern: Housing - High Risk (12/07/2024)  Transportation Needs: No Transportation Needs (01/20/2025)  Utilities: Not At Risk (01/20/2025)  Recent Concern: Utilities - At Risk (12/07/2024)  Alcohol Screen: Low Risk (01/23/2025)  Depression (PHQ2-9): Low Risk (12/13/2024)  Financial Resource Strain: Low Risk (01/23/2025)  Physical Activity: Inactive (10/19/2024)  Social Connections: Socially Integrated (01/20/2025)  Stress: No Stress Concern Present (10/19/2024)  Tobacco Use: Medium Risk (01/19/2025)  Health Literacy: Adequate  Health Literacy (05/08/2024)    Readmission Risk Interventions    12/11/2024    4:13 PM 02/18/2024   10:38 AM  Readmission Risk Prevention Plan  Post Dischage Appt Complete Complete  Medication Screening Complete Complete  Transportation Screening Complete Complete

## 2025-01-24 NOTE — Progress Notes (Signed)
 Peripherally Inserted Central Catheter Placement  The IV Nurse has discussed with the patient and/or persons authorized to consent for the patient, the purpose of this procedure and the potential benefits and risks involved with this procedure.  The benefits include less needle sticks, lab draws from the catheter, and the patient may be discharged home with the catheter. Risks include, but not limited to, infection, bleeding, blood clot (thrombus formation), and puncture of an artery; nerve damage and irregular heartbeat and possibility to perform a PICC exchange if needed/ordered by physician.  Alternatives to this procedure were also discussed.  Bard Power PICC patient education guide, fact sheet on infection prevention and patient information card has been provided to patient /or left at bedside.    PICC Placement Documentation  PICC Double Lumen 01/24/25 Right Basilic 38 cm 1 cm (Active)  Indication for Insertion or Continuance of Line Vasoactive infusions;Chronic illness with exacerbations (CF, Sickle Cell, etc.) 01/24/25 1741  Exposed Catheter (cm) 1 cm 01/24/25 1741  Site Assessment Ecchymotic;Clean, Dry, Intact 01/24/25 1741  Lumen #1 Status Saline locked;Flushed;Blood return noted 01/24/25 1741  Lumen #2 Status Saline locked;Flushed;Blood return noted 01/24/25 1741  Dressing Type Transparent;Securing device 01/24/25 1741  Dressing Status Antimicrobial disc/dressing in place;Clean, Dry, Intact 01/24/25 1741  Line Care Connections checked and tightened 01/24/25 1741  Line Adjustment (NICU/IV Team Only) No 01/24/25 1741  Dressing Intervention New dressing;Adhesive placed at insertion site (IV team only) 01/24/25 1741  Dressing Change Due 01/31/25 01/24/25 1741       Mario [name] 01/24/2025, 5:44 PM

## 2025-01-24 NOTE — Progress Notes (Signed)
 Patient increasingly complaining of sharp pain in feet. Unna boots were placed yesterday and per report, they were not as red as pictured below. I removed them fully at 0500 given the condition of his bilateral extremities . LLE more swollen and tender than RLE. Attending physician made aware of condition.

## 2025-01-24 NOTE — Progress Notes (Signed)
" ° °  Heart Failure Stewardship Pharmacist Progress Note   PCP: [name] DASEN, MD PCP-Cardiologist: Dorn Lesches, MD    HPI:  79 yo M with PMH of afib, HTN, HLD, OSA on CPAP, COPD.   Presented to the ED on 2/1 with shortness of breath and diarrhea for the last few days. EKG showed afib with RVR. proBNP elevated. CXR with pulmonary edema and small bilateral pleural effusions. Started on IV amiodarone . ECHO 2/6 with LVEF 35-40%, global hypokinesis, RV moderately reduced, mild MR. Underwent successful TEE/DCCV on 2/4 - EF 25-30%. RHC on 2/4 with RA 7, PA 30, wedge 11, CO 5, CI 2.5, PAPi 3.   Feels ok today, about the same as yesterday. Unna boots now off. Creatinine improving now, still elevated. Reviewed goals of therapy for GDMT. No questions at this time. Agreeable to using [facility] TOC pharmacy at discharge and is [facility].   Current HF Medications: Beta Blocker: metoprolol  tartrate 25 mg q6h  Prior to admission HF Medications: Beta blocker: metoprolol  XL 100 mg daily  Pertinent Lab Values: Serum creatinine 3.44, BUN 67, Potassium 3.7, Sodium 139, proBNP 9529, Magnesium 1.9  Vital Signs: Weight: 200 lbs (admission weight: 192 lbs) Blood pressure: 80-100/60s  Heart rate: 110-130s  I/O: net +1.3L yesterday; net +3.6L since admission  Medication Assistance / Insurance Benefits Check: Does the patient have prescription insurance?  Yes Type of insurance plan: [employer]  Outpatient Pharmacy:  [facility] Is the patient willing to use [facility] TOC pharmacy at discharge? Yes Is the patient willing to transition their outpatient pharmacy to utilize a [facility] outpatient pharmacy?   Yes    Assessment: 1. 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 NYHA class III symptoms. - Off lasix  - last dose of IV lasix  given on 2/3 and 40 mg PO on 2/4. Creatinine  remains elevated but trending down. Strict I/Os and daily weights. Keep K>4 and Mg>2. - On metoprolol  tartrate 25 mg q6h - consider consolidating to metoprolol  XL now that he is post DCCV and with HFrEF - GDMT limited by AKI   Plan: 1) Medication changes recommended at this time: - Consolidate metoprolol  tartrate to metoprolol  succinate - Further GDMT recs pending further improvement in renal function  2) Patient assistance: - Farxiga copay $45 - Jardiance copay $50 - Entresto copay $8  3)  Education  - Patient has been educated on current HF medications and potential additions to HF medication regimen - Patient verbalizes understanding that over the next few months, these medication doses may change and more medications may be added to optimize HF regimen - Patient has been educated on basic disease state pathophysiology and goals of therapy   Duwaine Plant, PharmD, BCPS Heart Failure Stewardship Pharmacist Phone [phone]   "

## 2025-01-24 NOTE — Progress Notes (Incomplete)
 " PROGRESS NOTE    CELIA GIBBONS  FMW:[id number] DOB: 03-30-1946 DOA: 01/19/2025 PCP: [name] DASEN, MD (Confirm with patient/family/NH records and if not entered, this HAS to be entered at [facility] point of entry. No PCP if truly none.)   Chief Complaint  Patient presents with   Shortness of Breath    A-fib RVR    Brief Narrative: (Start on day 1 of progress note - keep it brief and live) ***   Assessment & Plan:   Principal Problem:   C. difficile colitis Active Problems:   Atrial fibrillation with RVR (HCC)   Acute on chronic systolic CHF (congestive heart failure) (HCC)   AKI (acute kidney injury)   Bronchiectasis with (acute) exacerbation (HCC)   Candidal intertrigo   Hyperlipidemia   ***   DVT prophylaxis: (Lovenox /Heparin /SCD's/anticoagulated/None (if comfort care) Code Status: (Full/Partial - specify details) Family Communication: (Specify name, relationship & date discussed. NO discussed with patient) Disposition:   Status is: Inpatient {Inpatient:23812}   Consultants:  ***  Procedures: (Don't include imaging studies which can be auto populated. Include things that cannot be auto populated i.e. Echo, Carotid and venous dopplers, Foley, Bipap, HD, tubes/drains, wound vac, central lines etc) ***  Antimicrobials: (specify start and planned stop date. Auto populated tables are space occupying and do not give end dates) ***    Subjective: Patient laying in bed.  Denies any chest pain.  Some complaints of shortness of breath.  No abdominal pain.  States had 2 mushy stools today.  Asking for ice cold water .  Objective: Vitals:   01/24/25 0752 01/24/25 0801 01/24/25 0810 01/24/25 1120  BP: 105/72  102/74 104/77  Pulse: (!) 101 (!) 108 82   Resp: 16  20 16   Temp: 98.6 F (37 C)   97.9 F (36.6 C)  TempSrc: Oral   Oral  SpO2: 100% 100% 96%   Weight:      Height:        Intake/Output Summary (Last 24 hours) at 01/24/2025 1456 Last data filed at  01/24/2025 1105 Gross per 24 hour  Intake 3438.19 ml  Output 1140 ml  Net 2298.19 ml   Filed Weights   01/22/25 0500 01/23/25 0444 01/24/25 0500  Weight: 88.8 kg 88.4 kg 91 kg    Examination:  General exam: Appears calm and comfortable  Respiratory system: Clear to auscultation. Respiratory effort normal. Cardiovascular system: Tachycardia.  Irregularly irregular.  S1 & S2 heard, RRR. No JVD, murmurs, rubs, gallops or clicks. No pedal edema. Gastrointestinal system: Abdomen is nondistended, soft and nontender. No organomegaly or masses felt. Normal bowel sounds heard. Central nervous system: Alert and oriented. No focal neurological deficits. Extremities: Symmetric 5 x 5 power. Skin: No rashes, lesions or ulcers Psychiatry: Judgement and insight appear normal. Mood & affect appropriate.     Data Reviewed: I have personally reviewed following labs and imaging studies  CBC: Recent Labs  Lab 01/19/25 0920 01/19/25 1131 01/20/25 0239 01/21/25 0535 01/22/25 1207 01/24/25 0453  WBC 25.9*  --  17.2* 20.8*  --  26.7*  NEUTROABS 23.6*  --   --   --   --   --   HGB 13.0 13.3 12.8* 12.5* 13.3  13.6 12.1*  HCT 41.4 39.0 40.4 38.5* 39.0  40.0 38.7*  MCV 97.0  --  95.3 92.1  --  93.5  PLT 275  --  308 297  --  137*    Basic Metabolic Panel: Recent Labs  Lab 01/19/25 1924  01/20/25 0239 01/21/25 0535 01/22/25 0541 01/22/25 1207 01/23/25 0522 01/24/25 0453  NA  --  137 133* 134* 137  135 137 139  K  --  4.4 4.3 4.1 3.9  4.0 3.9 3.7  CL  --  102 100 100  --  102 104  CO2  --  16* 15* 16*  --  19* 20*  GLUCOSE  --  151* 153* 130*  --  105* 67*  BUN  --  50* 60* 71*  --  71* 67*  CREATININE  --  3.15* 3.88* 4.13*  --  4.04* 3.44*  CALCIUM   --  7.7* 7.1* 7.1*  --  6.7* 6.5*  MG 2.1  --  1.9 1.9  --   --   --   PHOS  --   --   --   --   --  6.2* 5.2*    GFR: Estimated Creatinine Clearance: 19.4 mL/min (A) (by C-G formula based on SCr of 3.44 mg/dL (H)).  Liver Function  Tests: Recent Labs  Lab 01/19/25 1010 01/23/25 0522 01/24/25 0453  AST 32  --   --   ALT 15  --   --   ALKPHOS 89  --   --   BILITOT 0.7  --   --   PROT 5.8*  --   --   ALBUMIN  2.5* 2.7* 2.4*    CBG: Recent Labs  Lab 01/23/25 1623 01/23/25 2108 01/24/25 0750 01/24/25 0912 01/24/25 1118  GLUCAP 71 71 69* 73 93     Recent Results (from the past 240 hours)  Resp panel by RT-PCR (RSV, Flu A&B, Covid) Anterior Nasal Swab     Status: None   Collection Time: 01/19/25  8:48 AM   Specimen: Anterior Nasal Swab  Result Value Ref Range Status   SARS Coronavirus 2 by RT PCR NEGATIVE NEGATIVE Final   Influenza A by PCR NEGATIVE NEGATIVE Final   Influenza B by PCR NEGATIVE NEGATIVE Final    Comment: (NOTE) The Xpert Xpress SARS-CoV-2/FLU/RSV plus assay is intended as an aid in the diagnosis of influenza from Nasopharyngeal swab specimens and should not be used as a sole basis for treatment. Nasal washings and aspirates are unacceptable for Xpert Xpress SARS-CoV-2/FLU/RSV testing.  Fact Sheet for Patients: bloggercourse.com  Fact Sheet for Healthcare Providers: seriousbroker.it  This test is not yet approved or cleared by the [place]  FDA and has been authorized for detection and/or diagnosis of SARS-CoV-2 by FDA under an Emergency Use Authorization (EUA). This EUA will remain in effect (meaning this test can be used) for the duration of the COVID-19 declaration under Section 564(b)(1) of the Act, 21 U.S.C. section 360bbb-3(b)(1), unless the authorization is terminated or revoked.     Resp Syncytial Virus by PCR NEGATIVE NEGATIVE Final    Comment: (NOTE) Fact Sheet for Patients: bloggercourse.com  Fact Sheet for Healthcare Providers: seriousbroker.it  This test is not yet approved or cleared by the [place]  FDA and has been authorized for detection and/or  diagnosis of SARS-CoV-2 by FDA under an Emergency Use Authorization (EUA). This EUA will remain in effect (meaning this test can be used) for the duration of the COVID-19 declaration under Section 564(b)(1) of the Act, 21 U.S.C. section 360bbb-3(b)(1), unless the authorization is terminated or revoked.  Performed at [facility], [address] [place], [place] [address]   Blood Culture (routine x 2)     Status: None   Collection Time: 01/19/25  8:50  AM   Specimen: BLOOD  Result Value Ref Range Status   Specimen Description BLOOD RIGHT ANTECUBITAL  Final   Special Requests   Final    BOTTLES DRAWN AEROBIC AND ANAEROBIC Blood Culture results may not be optimal due to an inadequate volume of blood received in culture bottles   Culture   Final    NO GROWTH 5 DAYS Performed at [facility], [address] [place], [place] [address]    Report Status 01/24/2025 FINAL  Final  Blood Culture (routine x 2)     Status: None   Collection Time: 01/19/25  8:55 AM   Specimen: BLOOD RIGHT HAND  Result Value Ref Range Status   Specimen Description BLOOD RIGHT HAND  Final   Special Requests   Final    BOTTLES DRAWN AEROBIC AND ANAEROBIC Blood Culture results may not be optimal due to an inadequate volume of blood received in culture bottles   Culture   Final    NO GROWTH 5 DAYS Performed at [facility], [address], [place], [place] [address]    Report Status 01/24/2025 FINAL  Final  Gastrointestinal Panel by PCR , Stool     Status: None   Collection Time: 01/19/25  1:49 PM   Specimen: Stool  Result Value Ref Range Status   Campylobacter species NOT DETECTED NOT DETECTED Final   Plesimonas shigelloides NOT DETECTED NOT DETECTED Final   Salmonella species NOT DETECTED NOT DETECTED Final   Yersinia enterocolitica NOT DETECTED NOT DETECTED Final   Vibrio species NOT DETECTED NOT DETECTED Final   Vibrio cholerae NOT DETECTED NOT DETECTED Final   Enteroaggregative E coli  (EAEC) NOT DETECTED NOT DETECTED Final   Enteropathogenic E coli (EPEC) NOT DETECTED NOT DETECTED Final   Enterotoxigenic E coli (ETEC) NOT DETECTED NOT DETECTED Final   Shiga like toxin producing E coli (STEC) NOT DETECTED NOT DETECTED Final   Shigella/Enteroinvasive E coli (EIEC) NOT DETECTED NOT DETECTED Final   Cryptosporidium NOT DETECTED NOT DETECTED Final   Cyclospora cayetanensis NOT DETECTED NOT DETECTED Final   Entamoeba histolytica NOT DETECTED NOT DETECTED Final   Giardia lamblia NOT DETECTED NOT DETECTED Final   Adenovirus F40/41 NOT DETECTED NOT DETECTED Final   Astrovirus NOT DETECTED NOT DETECTED Final   Norovirus GI/GII NOT DETECTED NOT DETECTED Final   Rotavirus A NOT DETECTED NOT DETECTED Final   Sapovirus (I, II, IV, and V) NOT DETECTED NOT DETECTED Final    Comment: Performed at [facility], [address] [place], [place] [address]  C Difficile Quick Screen w PCR reflex     Status: Abnormal   Collection Time: 01/19/25  1:49 PM   Specimen: Stool  Result Value Ref Range Status   C Diff antigen POSITIVE (A) NEGATIVE Final   C Diff toxin POSITIVE (A) NEGATIVE Final    Comment: RESULT CALLED TO, READ BACK BY AND VERIFIED WITH: RN: JALESA M @ 1243 ON 01/21/2025 BY DZT     C Diff interpretation Toxin producing C. difficile detected.  Final    Comment: Performed at [facility], [address], [place], [place] [address]  Urine Culture     Status: None   Collection Time: 01/19/25  6:45 PM   Specimen: Urine, Random  Result Value Ref Range Status   Specimen Description URINE, RANDOM  Final   Special Requests NONE Reflexed from [id number]  Final   Culture   Final    NO GROWTH  Performed at [facility], [address] [place], [place] [address]    Report Status 01/21/2025 FINAL  Final         Radiology Studies: No results found.      Scheduled Meds:  apixaban   5 mg Oral BID   atorvastatin   40 mg Oral Daily   budesonide  (PULMICORT )  nebulizer solution  0.5 mg Nebulization BID   Chlorhexidine  Gluconate Cloth  6 each Topical Daily   metoprolol  tartrate  25 mg Oral Q6H   montelukast   10 mg Oral Daily   nystatin    Topical BID   pantoprazole   40 mg Oral BID   sodium chloride  flush  3 mL Intravenous Q12H   tamsulosin   0.4 mg Oral QPC supper   vancomycin   125 mg Oral QID   Continuous Infusions:  sodium chloride      amiodarone        LOS: 5 days    Time spent: ***    Toribio Hummer, MD [facility]   To contact the attending provider between 7A-7P or the covering provider during after hours 7P-7A, please log into the web site [email or web address] and access using universal [place] password for that web site. If you do not have the password, please call the hospital operator.  01/24/2025, 2:56 PM    "

## 2025-01-28 ENCOUNTER — Encounter (HOSPITAL_COMMUNITY)

## 2025-01-29 ENCOUNTER — Inpatient Hospital Stay (HOSPITAL_COMMUNITY)

## 2025-01-30 ENCOUNTER — Encounter (HOSPITAL_COMMUNITY)

## 2025-02-04 ENCOUNTER — Encounter (HOSPITAL_COMMUNITY)

## 2025-02-06 ENCOUNTER — Encounter (HOSPITAL_COMMUNITY)

## 2025-02-10 ENCOUNTER — Ambulatory Visit: Admitting: Pulmonary Disease

## 2025-02-11 ENCOUNTER — Encounter (HOSPITAL_COMMUNITY)

## 2025-02-12 ENCOUNTER — Ambulatory Visit: Admitting: "Endocrinology

## 2025-02-13 ENCOUNTER — Encounter (HOSPITAL_COMMUNITY)

## 2025-02-18 ENCOUNTER — Encounter (HOSPITAL_COMMUNITY)

## 2025-02-20 ENCOUNTER — Encounter (HOSPITAL_COMMUNITY)

## 2025-02-25 ENCOUNTER — Encounter (HOSPITAL_COMMUNITY)

## 2025-02-27 ENCOUNTER — Encounter (HOSPITAL_COMMUNITY)

## 2025-04-09 ENCOUNTER — Ambulatory Visit (HOSPITAL_COMMUNITY): Admitting: Physician Assistant

## 2025-05-14 ENCOUNTER — Ambulatory Visit
